# Patient Record
Sex: Female | Born: 1965 | Race: White | Hispanic: No | Smoking: Never smoker
Health system: Southern US, Community
[De-identification: ages and names within clinical notes are randomized; demographics above are authoritative.]

## PROBLEM LIST (undated history)

## (undated) DIAGNOSIS — R51 Headache: Secondary | ICD-10-CM

## (undated) DIAGNOSIS — G43909 Migraine, unspecified, not intractable, without status migrainosus: Secondary | ICD-10-CM

## (undated) DIAGNOSIS — M546 Pain in thoracic spine: Secondary | ICD-10-CM

## (undated) DIAGNOSIS — F329 Major depressive disorder, single episode, unspecified: Secondary | ICD-10-CM

## (undated) DIAGNOSIS — M545 Low back pain, unspecified: Secondary | ICD-10-CM

## (undated) DIAGNOSIS — M2550 Pain in unspecified joint: Secondary | ICD-10-CM

## (undated) DIAGNOSIS — H469 Unspecified optic neuritis: Secondary | ICD-10-CM

## (undated) DIAGNOSIS — R519 Headache, unspecified: Secondary | ICD-10-CM

## (undated) DIAGNOSIS — F32A Depression, unspecified: Secondary | ICD-10-CM

## (undated) DIAGNOSIS — E039 Hypothyroidism, unspecified: Secondary | ICD-10-CM

## (undated) DIAGNOSIS — R109 Unspecified abdominal pain: Secondary | ICD-10-CM

## (undated) DIAGNOSIS — M25511 Pain in right shoulder: Secondary | ICD-10-CM

## (undated) DIAGNOSIS — F4310 Post-traumatic stress disorder, unspecified: Secondary | ICD-10-CM

## (undated) DIAGNOSIS — E079 Disorder of thyroid, unspecified: Secondary | ICD-10-CM

## (undated) DIAGNOSIS — G932 Benign intracranial hypertension: Secondary | ICD-10-CM

## (undated) DIAGNOSIS — M797 Fibromyalgia: Secondary | ICD-10-CM

## (undated) DIAGNOSIS — M160 Bilateral primary osteoarthritis of hip: Secondary | ICD-10-CM

## (undated) DIAGNOSIS — F411 Generalized anxiety disorder: Secondary | ICD-10-CM

## (undated) DIAGNOSIS — G8929 Other chronic pain: Secondary | ICD-10-CM

## (undated) DIAGNOSIS — E119 Type 2 diabetes mellitus without complications: Secondary | ICD-10-CM

## (undated) DIAGNOSIS — K219 Gastro-esophageal reflux disease without esophagitis: Secondary | ICD-10-CM

## (undated) DIAGNOSIS — G894 Chronic pain syndrome: Secondary | ICD-10-CM

## (undated) DIAGNOSIS — Z87442 Personal history of urinary calculi: Secondary | ICD-10-CM

## (undated) DIAGNOSIS — I10 Essential (primary) hypertension: Secondary | ICD-10-CM

## (undated) DIAGNOSIS — M47812 Spondylosis without myelopathy or radiculopathy, cervical region: Secondary | ICD-10-CM

## (undated) DIAGNOSIS — K625 Hemorrhage of anus and rectum: Secondary | ICD-10-CM

## (undated) DIAGNOSIS — K760 Fatty (change of) liver, not elsewhere classified: Secondary | ICD-10-CM

## (undated) DIAGNOSIS — R1310 Dysphagia, unspecified: Secondary | ICD-10-CM

## (undated) DIAGNOSIS — G629 Polyneuropathy, unspecified: Secondary | ICD-10-CM

## (undated) DIAGNOSIS — D6852 Prothrombin gene mutation: Secondary | ICD-10-CM

## (undated) DIAGNOSIS — M5481 Occipital neuralgia: Secondary | ICD-10-CM

## (undated) DIAGNOSIS — F419 Anxiety disorder, unspecified: Secondary | ICD-10-CM

## (undated) DIAGNOSIS — M75100 Unspecified rotator cuff tear or rupture of unspecified shoulder, not specified as traumatic: Secondary | ICD-10-CM

## (undated) DIAGNOSIS — K7400 Hepatic fibrosis, unspecified: Secondary | ICD-10-CM

## (undated) DIAGNOSIS — G4700 Insomnia, unspecified: Secondary | ICD-10-CM

## (undated) DIAGNOSIS — I639 Cerebral infarction, unspecified: Secondary | ICD-10-CM

## (undated) DIAGNOSIS — I499 Cardiac arrhythmia, unspecified: Secondary | ICD-10-CM

## (undated) DIAGNOSIS — G501 Atypical facial pain: Secondary | ICD-10-CM

## (undated) DIAGNOSIS — Z9889 Other specified postprocedural states: Secondary | ICD-10-CM

## (undated) DIAGNOSIS — E785 Hyperlipidemia, unspecified: Secondary | ICD-10-CM

## (undated) DIAGNOSIS — N411 Chronic prostatitis: Secondary | ICD-10-CM

## (undated) DIAGNOSIS — T8859XA Other complications of anesthesia, initial encounter: Secondary | ICD-10-CM

## (undated) DIAGNOSIS — G459 Transient cerebral ischemic attack, unspecified: Secondary | ICD-10-CM

## (undated) DIAGNOSIS — G4733 Obstructive sleep apnea (adult) (pediatric): Secondary | ICD-10-CM

## (undated) DIAGNOSIS — M479 Spondylosis, unspecified: Secondary | ICD-10-CM

## (undated) DIAGNOSIS — Z9689 Presence of other specified functional implants: Secondary | ICD-10-CM

## (undated) DIAGNOSIS — R0600 Dyspnea, unspecified: Secondary | ICD-10-CM

## (undated) DIAGNOSIS — I2510 Atherosclerotic heart disease of native coronary artery without angina pectoris: Secondary | ICD-10-CM

## (undated) DIAGNOSIS — M25552 Pain in left hip: Secondary | ICD-10-CM

## (undated) DIAGNOSIS — R200 Anesthesia of skin: Secondary | ICD-10-CM

## (undated) DIAGNOSIS — D509 Iron deficiency anemia, unspecified: Secondary | ICD-10-CM

## (undated) HISTORY — DX: Headache, unspecified: R51.9

## (undated) HISTORY — DX: Iron deficiency anemia, unspecified: D50.9

## (undated) HISTORY — DX: Hyperlipidemia, unspecified: E78.5

## (undated) HISTORY — DX: Anesthesia of skin: R20.0

## (undated) HISTORY — DX: Pain in thoracic spine: M54.6

## (undated) HISTORY — DX: Major depressive disorder, single episode, unspecified: F32.9

## (undated) HISTORY — DX: Occipital neuralgia: M54.81

## (undated) HISTORY — DX: Pain in right shoulder: M25.511

## (undated) HISTORY — PX: OTHER SURGICAL HISTORY: SHX169

## (undated) HISTORY — PX: ABDOMINAL HYSTERECTOMY: SHX81

## (undated) HISTORY — DX: Chronic prostatitis: N41.1

## (undated) HISTORY — DX: Type 2 diabetes mellitus without complications: E11.9

## (undated) HISTORY — DX: Fibromyalgia: M79.7

## (undated) HISTORY — PX: ROTATOR CUFF REPAIR: SHX139

## (undated) HISTORY — DX: Other chronic pain: G89.29

## (undated) HISTORY — DX: Hemorrhage of anus and rectum: K62.5

## (undated) HISTORY — DX: Depression, unspecified: F32.A

## (undated) HISTORY — DX: Dysphagia, unspecified: R13.10

## (undated) HISTORY — DX: Spondylosis without myelopathy or radiculopathy, cervical region: M47.812

## (undated) HISTORY — DX: Unspecified optic neuritis: H46.9

## (undated) HISTORY — DX: Low back pain: M54.5

## (undated) HISTORY — DX: Low back pain, unspecified: M54.50

## (undated) HISTORY — DX: Headache: R51

## (undated) HISTORY — DX: Unspecified abdominal pain: R10.9

## (undated) HISTORY — PX: KNEE SURGERY: SHX244

## (undated) HISTORY — DX: Atypical facial pain: G50.1

## (undated) HISTORY — DX: Bilateral primary osteoarthritis of hip: M16.0

## (undated) HISTORY — PX: JOINT REPLACEMENT: SHX530

## (undated) HISTORY — DX: Unspecified rotator cuff tear or rupture of unspecified shoulder, not specified as traumatic: M75.100

## (undated) HISTORY — PX: CHOLECYSTECTOMY: SHX55

## (undated) HISTORY — DX: Pain in left hip: M25.552

## (undated) HISTORY — DX: Pain in unspecified joint: M25.50

## (undated) HISTORY — DX: Benign intracranial hypertension: G93.2

## (undated) HISTORY — DX: Insomnia, unspecified: G47.00

## (undated) HISTORY — DX: Obstructive sleep apnea (adult) (pediatric): G47.33

---

## 2004-03-16 ENCOUNTER — Emergency Department: Payer: Self-pay | Admitting: Emergency Medicine

## 2004-04-13 ENCOUNTER — Ambulatory Visit: Payer: Self-pay | Admitting: Pain Medicine

## 2004-04-22 ENCOUNTER — Ambulatory Visit: Payer: Self-pay | Admitting: Physician Assistant

## 2004-05-12 ENCOUNTER — Ambulatory Visit: Payer: Self-pay | Admitting: Pain Medicine

## 2004-06-02 ENCOUNTER — Ambulatory Visit: Payer: Self-pay | Admitting: Physician Assistant

## 2004-06-16 ENCOUNTER — Ambulatory Visit: Payer: Self-pay | Admitting: Physician Assistant

## 2004-07-16 ENCOUNTER — Ambulatory Visit: Payer: Self-pay | Admitting: Pain Medicine

## 2004-08-21 ENCOUNTER — Ambulatory Visit: Payer: Self-pay | Admitting: Physician Assistant

## 2004-09-15 ENCOUNTER — Ambulatory Visit: Payer: Self-pay | Admitting: Physician Assistant

## 2004-10-12 ENCOUNTER — Ambulatory Visit: Payer: Self-pay | Admitting: Physician Assistant

## 2004-11-04 ENCOUNTER — Other Ambulatory Visit: Payer: Self-pay

## 2004-11-11 ENCOUNTER — Ambulatory Visit: Payer: Self-pay | Admitting: Pain Medicine

## 2004-11-16 ENCOUNTER — Inpatient Hospital Stay: Payer: Self-pay | Admitting: Pain Medicine

## 2004-11-16 ENCOUNTER — Ambulatory Visit: Payer: Self-pay | Admitting: Pain Medicine

## 2004-12-02 ENCOUNTER — Ambulatory Visit: Payer: Self-pay | Admitting: Physician Assistant

## 2004-12-18 ENCOUNTER — Emergency Department: Payer: Self-pay | Admitting: Emergency Medicine

## 2004-12-19 ENCOUNTER — Other Ambulatory Visit: Payer: Self-pay

## 2005-01-05 ENCOUNTER — Ambulatory Visit: Payer: Self-pay | Admitting: Physician Assistant

## 2005-01-26 ENCOUNTER — Ambulatory Visit: Payer: Self-pay | Admitting: Physician Assistant

## 2005-03-04 ENCOUNTER — Ambulatory Visit: Payer: Self-pay | Admitting: Physician Assistant

## 2005-03-17 ENCOUNTER — Ambulatory Visit: Payer: Self-pay | Admitting: Physician Assistant

## 2005-03-24 ENCOUNTER — Ambulatory Visit: Payer: Self-pay | Admitting: Pain Medicine

## 2005-03-24 ENCOUNTER — Ambulatory Visit: Payer: Self-pay | Admitting: Physician Assistant

## 2005-03-29 ENCOUNTER — Ambulatory Visit: Payer: Self-pay | Admitting: Physician Assistant

## 2005-04-12 ENCOUNTER — Ambulatory Visit: Payer: Self-pay | Admitting: Pain Medicine

## 2005-04-13 ENCOUNTER — Ambulatory Visit: Payer: Self-pay | Admitting: Pain Medicine

## 2005-04-20 ENCOUNTER — Ambulatory Visit: Payer: Self-pay | Admitting: Pain Medicine

## 2005-04-22 ENCOUNTER — Ambulatory Visit: Payer: Self-pay | Admitting: Physician Assistant

## 2005-04-27 ENCOUNTER — Ambulatory Visit: Payer: Self-pay | Admitting: Physician Assistant

## 2005-04-30 ENCOUNTER — Ambulatory Visit: Payer: Self-pay | Admitting: Physician Assistant

## 2005-05-27 ENCOUNTER — Ambulatory Visit: Payer: Self-pay | Admitting: Physician Assistant

## 2005-05-29 ENCOUNTER — Ambulatory Visit: Payer: Self-pay | Admitting: Pain Medicine

## 2005-05-31 ENCOUNTER — Other Ambulatory Visit: Payer: Self-pay

## 2005-05-31 ENCOUNTER — Emergency Department: Payer: Self-pay | Admitting: Emergency Medicine

## 2005-06-10 ENCOUNTER — Ambulatory Visit: Payer: Self-pay | Admitting: Physician Assistant

## 2005-06-16 ENCOUNTER — Encounter: Payer: Self-pay | Admitting: Pain Medicine

## 2005-06-24 ENCOUNTER — Encounter: Payer: Self-pay | Admitting: Pain Medicine

## 2005-09-02 ENCOUNTER — Emergency Department: Payer: Self-pay | Admitting: Emergency Medicine

## 2005-09-24 ENCOUNTER — Ambulatory Visit: Payer: Self-pay | Admitting: Physician Assistant

## 2005-10-07 ENCOUNTER — Ambulatory Visit: Payer: Self-pay | Admitting: Pediatrics

## 2005-11-09 ENCOUNTER — Ambulatory Visit: Payer: Self-pay | Admitting: Physician Assistant

## 2006-12-23 ENCOUNTER — Ambulatory Visit: Payer: Self-pay | Admitting: Family Medicine

## 2006-12-26 ENCOUNTER — Other Ambulatory Visit: Payer: Self-pay

## 2006-12-26 ENCOUNTER — Emergency Department: Payer: Self-pay | Admitting: Emergency Medicine

## 2007-01-04 ENCOUNTER — Ambulatory Visit: Payer: Self-pay | Admitting: Family Medicine

## 2007-06-18 ENCOUNTER — Emergency Department: Payer: Self-pay | Admitting: Emergency Medicine

## 2007-08-31 ENCOUNTER — Emergency Department: Payer: Self-pay | Admitting: Emergency Medicine

## 2008-12-16 ENCOUNTER — Emergency Department: Payer: Self-pay | Admitting: Emergency Medicine

## 2009-07-02 ENCOUNTER — Emergency Department: Payer: Self-pay | Admitting: Emergency Medicine

## 2009-08-17 ENCOUNTER — Ambulatory Visit: Payer: Self-pay | Admitting: Internal Medicine

## 2010-01-17 ENCOUNTER — Emergency Department: Payer: Self-pay | Admitting: Emergency Medicine

## 2010-01-26 ENCOUNTER — Emergency Department: Payer: Self-pay | Admitting: Emergency Medicine

## 2010-01-27 ENCOUNTER — Emergency Department: Payer: Self-pay | Admitting: Emergency Medicine

## 2010-07-03 ENCOUNTER — Observation Stay: Payer: Self-pay | Admitting: Internal Medicine

## 2010-08-08 ENCOUNTER — Emergency Department: Payer: Self-pay | Admitting: Emergency Medicine

## 2011-03-10 ENCOUNTER — Ambulatory Visit: Payer: Self-pay

## 2011-10-03 DIAGNOSIS — G501 Atypical facial pain: Secondary | ICD-10-CM | POA: Insufficient documentation

## 2011-10-03 DIAGNOSIS — R519 Headache, unspecified: Secondary | ICD-10-CM | POA: Insufficient documentation

## 2011-10-03 DIAGNOSIS — M797 Fibromyalgia: Secondary | ICD-10-CM | POA: Insufficient documentation

## 2011-10-03 DIAGNOSIS — E785 Hyperlipidemia, unspecified: Secondary | ICD-10-CM | POA: Insufficient documentation

## 2011-10-04 DIAGNOSIS — M25511 Pain in right shoulder: Secondary | ICD-10-CM | POA: Insufficient documentation

## 2011-10-04 DIAGNOSIS — G4701 Insomnia due to medical condition: Secondary | ICD-10-CM | POA: Insufficient documentation

## 2011-11-17 ENCOUNTER — Emergency Department: Payer: Self-pay | Admitting: Internal Medicine

## 2011-11-17 LAB — COMPREHENSIVE METABOLIC PANEL
Albumin: 3.8 g/dL (ref 3.4–5.0)
Alkaline Phosphatase: 139 U/L — ABNORMAL HIGH (ref 50–136)
BUN: 9 mg/dL (ref 7–18)
Bilirubin,Total: 0.4 mg/dL (ref 0.2–1.0)
Calcium, Total: 8.7 mg/dL (ref 8.5–10.1)
Co2: 26 mmol/L (ref 21–32)
Creatinine: 1.03 mg/dL (ref 0.60–1.30)
EGFR (African American): >60
Glucose: 115 mg/dL — ABNORMAL HIGH (ref 65–99)
SGPT (ALT): 150 U/L — ABNORMAL HIGH
Sodium: 137 mmol/L (ref 136–145)
Total Protein: 8 g/dL (ref 6.4–8.2)

## 2011-11-17 LAB — URINALYSIS, COMPLETE
Bacteria: NONE SEEN
Bilirubin,UR: NEGATIVE
Blood: NEGATIVE
Ketone: NEGATIVE
Leukocyte Esterase: NEGATIVE
Nitrite: NEGATIVE
Protein: NEGATIVE
RBC,UR: NONE SEEN /HPF (ref 0–5)
Specific Gravity: 1.014 (ref 1.003–1.030)
Squamous Epithelial: NONE SEEN

## 2011-11-17 LAB — CK TOTAL AND CKMB (NOT AT ARMC)
CK, Total: 73 U/L (ref 21–215)
CK-MB: 0.8 ng/mL (ref 0.5–3.6)

## 2011-11-17 LAB — CBC
MCH: 23.3 pg — ABNORMAL LOW (ref 26.0–34.0)
MCV: 75 fL — ABNORMAL LOW (ref 80–100)
Platelet: 164 10*3/uL (ref 150–440)
RDW: 15.6 % — ABNORMAL HIGH (ref 11.5–14.5)
WBC: 10.8 10*3/uL (ref 3.6–11.0)

## 2011-11-17 LAB — PROTIME-INR: INR: 1

## 2011-11-22 LAB — CULTURE, BLOOD (SINGLE)

## 2012-01-17 DIAGNOSIS — M25559 Pain in unspecified hip: Secondary | ICD-10-CM | POA: Insufficient documentation

## 2012-02-22 DIAGNOSIS — G709 Myoneural disorder, unspecified: Secondary | ICD-10-CM | POA: Insufficient documentation

## 2012-02-22 DIAGNOSIS — F329 Major depressive disorder, single episode, unspecified: Secondary | ICD-10-CM | POA: Insufficient documentation

## 2012-02-22 DIAGNOSIS — F32A Depression, unspecified: Secondary | ICD-10-CM | POA: Insufficient documentation

## 2012-02-22 DIAGNOSIS — K219 Gastro-esophageal reflux disease without esophagitis: Secondary | ICD-10-CM | POA: Insufficient documentation

## 2012-02-22 DIAGNOSIS — E669 Obesity, unspecified: Secondary | ICD-10-CM | POA: Insufficient documentation

## 2012-02-22 DIAGNOSIS — I10 Essential (primary) hypertension: Secondary | ICD-10-CM | POA: Insufficient documentation

## 2012-08-20 ENCOUNTER — Emergency Department: Payer: Self-pay | Admitting: Emergency Medicine

## 2012-08-20 LAB — URINALYSIS, COMPLETE
Bacteria: NONE SEEN
Blood: NEGATIVE
Nitrite: NEGATIVE
Protein: NEGATIVE
Specific Gravity: 1.012 (ref 1.003–1.030)
Squamous Epithelial: 2

## 2012-08-20 LAB — CBC
HGB: 16 g/dL (ref 12.0–16.0)
MCH: 28.1 pg (ref 26.0–34.0)
Platelet: 256 10*3/uL (ref 150–440)
RBC: 5.69 10*6/uL — ABNORMAL HIGH (ref 3.80–5.20)

## 2012-08-20 LAB — COMPREHENSIVE METABOLIC PANEL
Alkaline Phosphatase: 271 U/L — ABNORMAL HIGH (ref 50–136)
Anion Gap: 9 (ref 7–16)
BUN: 12 mg/dL (ref 7–18)
Bilirubin,Total: 0.5 mg/dL (ref 0.2–1.0)
Calcium, Total: 9.2 mg/dL (ref 8.5–10.1)
Chloride: 101 mmol/L (ref 98–107)
Co2: 25 mmol/L (ref 21–32)
EGFR (African American): >60
EGFR (Non-African Amer.): >60
Glucose: 130 mg/dL — ABNORMAL HIGH (ref 65–99)
Osmolality: 272 (ref 275–301)
Potassium: 3.8 mmol/L (ref 3.5–5.1)
Sodium: 135 mmol/L — ABNORMAL LOW (ref 136–145)
Total Protein: 8.5 g/dL — ABNORMAL HIGH (ref 6.4–8.2)

## 2012-09-22 ENCOUNTER — Emergency Department: Payer: Self-pay | Admitting: Internal Medicine

## 2012-09-22 LAB — CBC
HCT: 44.9 % (ref 35.0–47.0)
HGB: 15.2 g/dL (ref 12.0–16.0)
MCH: 29 pg (ref 26.0–34.0)
MCHC: 34 g/dL (ref 32.0–36.0)
MCV: 85 fL (ref 80–100)
Platelet: 247 10*3/uL (ref 150–440)
RBC: 5.26 10*6/uL — ABNORMAL HIGH (ref 3.80–5.20)
RDW: 14.1 % (ref 11.5–14.5)

## 2012-09-22 LAB — COMPREHENSIVE METABOLIC PANEL
Bilirubin,Total: 0.4 mg/dL (ref 0.2–1.0)
Chloride: 106 mmol/L (ref 98–107)
Co2: 29 mmol/L (ref 21–32)
EGFR (Non-African Amer.): >60
Glucose: 124 mg/dL — ABNORMAL HIGH (ref 65–99)
Osmolality: 277 (ref 275–301)
Potassium: 3.7 mmol/L (ref 3.5–5.1)
SGPT (ALT): 57 U/L (ref 12–78)
Sodium: 139 mmol/L (ref 136–145)
Total Protein: 8.4 g/dL — ABNORMAL HIGH (ref 6.4–8.2)

## 2012-09-22 LAB — URINALYSIS, COMPLETE
Bacteria: NONE SEEN
Bilirubin,UR: NEGATIVE
Glucose,UR: NEGATIVE mg/dL (ref 0–75)
Leukocyte Esterase: NEGATIVE
Ph: 7 (ref 4.5–8.0)
Protein: NEGATIVE
RBC,UR: 5 /HPF (ref 0–5)
Specific Gravity: 1.014 (ref 1.003–1.030)
WBC UR: 1 /HPF (ref 0–5)

## 2012-09-22 LAB — TROPONIN I: Troponin-I: <0.02 ng/mL

## 2012-09-22 LAB — LIPASE, BLOOD: Lipase: 116 U/L (ref 73–393)

## 2012-09-28 DIAGNOSIS — K625 Hemorrhage of anus and rectum: Secondary | ICD-10-CM | POA: Insufficient documentation

## 2012-09-28 DIAGNOSIS — R109 Unspecified abdominal pain: Secondary | ICD-10-CM | POA: Insufficient documentation

## 2012-11-07 DIAGNOSIS — R0981 Nasal congestion: Secondary | ICD-10-CM | POA: Insufficient documentation

## 2012-11-13 DIAGNOSIS — M75100 Unspecified rotator cuff tear or rupture of unspecified shoulder, not specified as traumatic: Secondary | ICD-10-CM | POA: Insufficient documentation

## 2012-11-14 ENCOUNTER — Ambulatory Visit: Payer: Self-pay | Admitting: Otolaryngology

## 2012-11-16 DIAGNOSIS — M47812 Spondylosis without myelopathy or radiculopathy, cervical region: Secondary | ICD-10-CM | POA: Insufficient documentation

## 2013-03-12 ENCOUNTER — Ambulatory Visit: Payer: Self-pay | Admitting: Otolaryngology

## 2013-07-04 DIAGNOSIS — R51 Headache: Secondary | ICD-10-CM

## 2013-07-04 DIAGNOSIS — G4486 Cervicogenic headache: Secondary | ICD-10-CM | POA: Insufficient documentation

## 2013-09-15 ENCOUNTER — Emergency Department: Payer: Self-pay | Admitting: Emergency Medicine

## 2013-09-15 LAB — LIPASE, BLOOD: Lipase: 164 U/L (ref 73–393)

## 2013-09-15 LAB — ETHANOL
Ethanol %: <0.003 % (ref 0.000–0.080)
Ethanol: <3 mg/dL

## 2013-09-15 LAB — URINALYSIS, COMPLETE
BILIRUBIN, UR: NEGATIVE
Bacteria: NONE SEEN
Blood: NEGATIVE
Glucose,UR: NEGATIVE mg/dL (ref 0–75)
KETONE: NEGATIVE
Leukocyte Esterase: NEGATIVE
Nitrite: NEGATIVE
Ph: 7 (ref 4.5–8.0)
Protein: NEGATIVE
RBC, UR: NONE SEEN /HPF (ref 0–5)
Specific Gravity: 1.005 (ref 1.003–1.030)
WBC UR: <1 /HPF (ref 0–5)

## 2013-09-15 LAB — CBC WITH DIFFERENTIAL/PLATELET
BASOS ABS: 0.1 10*3/uL (ref 0.0–0.1)
Basophil %: 1 %
EOS ABS: 0.1 10*3/uL (ref 0.0–0.7)
EOS PCT: 1.5 %
HCT: 39.6 % (ref 35.0–47.0)
HGB: 12.7 g/dL (ref 12.0–16.0)
LYMPHS PCT: 38.6 %
Lymphocyte #: 3.6 10*3/uL (ref 1.0–3.6)
MCH: 25.2 pg — ABNORMAL LOW (ref 26.0–34.0)
MCHC: 32 g/dL (ref 32.0–36.0)
MCV: 79 fL — AB (ref 80–100)
MONO ABS: 0.7 x10 3/mm (ref 0.2–0.9)
Monocyte %: 7.8 %
NEUTROS ABS: 4.8 10*3/uL (ref 1.4–6.5)
Neutrophil %: 51.1 %
Platelet: 243 10*3/uL (ref 150–440)
RBC: 5.03 10*6/uL (ref 3.80–5.20)
RDW: 15.5 % — ABNORMAL HIGH (ref 11.5–14.5)
WBC: 9.3 10*3/uL (ref 3.6–11.0)

## 2013-09-15 LAB — COMPREHENSIVE METABOLIC PANEL
ALBUMIN: 4.2 g/dL (ref 3.4–5.0)
ALT: 81 U/L — AB (ref 12–78)
Alkaline Phosphatase: 153 U/L — ABNORMAL HIGH
Anion Gap: 8 (ref 7–16)
BUN: 9 mg/dL (ref 7–18)
Bilirubin,Total: 0.3 mg/dL (ref 0.2–1.0)
Calcium, Total: 9.2 mg/dL (ref 8.5–10.1)
Chloride: 104 mmol/L (ref 98–107)
Co2: 24 mmol/L (ref 21–32)
Creatinine: 0.89 mg/dL (ref 0.60–1.30)
EGFR (African American): >60
Glucose: 159 mg/dL — ABNORMAL HIGH (ref 65–99)
OSMOLALITY: 274 (ref 275–301)
Potassium: 3.9 mmol/L (ref 3.5–5.1)
SGOT(AST): 56 U/L — ABNORMAL HIGH (ref 15–37)
Sodium: 136 mmol/L (ref 136–145)
Total Protein: 8.8 g/dL — ABNORMAL HIGH (ref 6.4–8.2)

## 2013-09-15 LAB — TROPONIN I: Troponin-I: <0.02 ng/mL

## 2013-10-17 DIAGNOSIS — E039 Hypothyroidism, unspecified: Secondary | ICD-10-CM | POA: Insufficient documentation

## 2013-10-17 DIAGNOSIS — E119 Type 2 diabetes mellitus without complications: Secondary | ICD-10-CM | POA: Insufficient documentation

## 2014-01-19 ENCOUNTER — Emergency Department: Payer: Self-pay | Admitting: Emergency Medicine

## 2014-01-20 ENCOUNTER — Emergency Department: Payer: Self-pay | Admitting: Internal Medicine

## 2014-03-22 ENCOUNTER — Emergency Department: Payer: Self-pay | Admitting: Emergency Medicine

## 2014-03-22 LAB — COMPREHENSIVE METABOLIC PANEL
ALBUMIN: 4.5 g/dL (ref 3.4–5.0)
ALK PHOS: 147 U/L — AB
ALT: 37 U/L
Anion Gap: 11 (ref 7–16)
BUN: 7 mg/dL (ref 7–18)
Bilirubin,Total: 0.3 mg/dL (ref 0.2–1.0)
CALCIUM: 9 mg/dL (ref 8.5–10.1)
Chloride: 104 mmol/L (ref 98–107)
Co2: 23 mmol/L (ref 21–32)
Creatinine: 0.98 mg/dL (ref 0.60–1.30)
EGFR (Non-African Amer.): >60
GLUCOSE: 156 mg/dL — AB (ref 65–99)
OSMOLALITY: 277 (ref 275–301)
Potassium: 3.7 mmol/L (ref 3.5–5.1)
SGOT(AST): 34 U/L (ref 15–37)
Sodium: 138 mmol/L (ref 136–145)
Total Protein: 8.8 g/dL — ABNORMAL HIGH (ref 6.4–8.2)

## 2014-03-22 LAB — URINALYSIS, COMPLETE
BILIRUBIN, UR: NEGATIVE
Bacteria: NONE SEEN
Blood: NEGATIVE
Glucose,UR: NEGATIVE mg/dL (ref 0–75)
Hyaline Cast: 18
KETONE: NEGATIVE
Leukocyte Esterase: NEGATIVE
NITRITE: NEGATIVE
PH: 5 (ref 4.5–8.0)
RBC,UR: 7 /HPF (ref 0–5)
Specific Gravity: 1.027 (ref 1.003–1.030)
Squamous Epithelial: 1

## 2014-03-22 LAB — CBC
HCT: 35.5 % (ref 35.0–47.0)
HGB: 10.5 g/dL — ABNORMAL LOW (ref 12.0–16.0)
MCH: 21.9 pg — ABNORMAL LOW (ref 26.0–34.0)
MCHC: 29.7 g/dL — AB (ref 32.0–36.0)
MCV: 74 fL — AB (ref 80–100)
Platelet: 330 10*3/uL (ref 150–440)
RBC: 4.82 10*6/uL (ref 3.80–5.20)
RDW: 17.2 % — AB (ref 11.5–14.5)
WBC: 8.7 10*3/uL (ref 3.6–11.0)

## 2014-04-11 DIAGNOSIS — M5481 Occipital neuralgia: Secondary | ICD-10-CM | POA: Insufficient documentation

## 2014-04-22 ENCOUNTER — Emergency Department: Payer: Self-pay | Admitting: Emergency Medicine

## 2014-05-06 ENCOUNTER — Ambulatory Visit: Payer: Self-pay | Admitting: Family Medicine

## 2014-07-24 DIAGNOSIS — M546 Pain in thoracic spine: Secondary | ICD-10-CM

## 2014-07-24 DIAGNOSIS — G8929 Other chronic pain: Secondary | ICD-10-CM | POA: Insufficient documentation

## 2014-10-02 DIAGNOSIS — D509 Iron deficiency anemia, unspecified: Secondary | ICD-10-CM | POA: Insufficient documentation

## 2015-06-27 DIAGNOSIS — Z86718 Personal history of other venous thrombosis and embolism: Secondary | ICD-10-CM | POA: Insufficient documentation

## 2015-06-27 DIAGNOSIS — Z8673 Personal history of transient ischemic attack (TIA), and cerebral infarction without residual deficits: Secondary | ICD-10-CM | POA: Insufficient documentation

## 2015-09-01 DIAGNOSIS — R760 Raised antibody titer: Secondary | ICD-10-CM | POA: Insufficient documentation

## 2015-09-01 DIAGNOSIS — D6852 Prothrombin gene mutation: Secondary | ICD-10-CM | POA: Insufficient documentation

## 2015-09-16 ENCOUNTER — Encounter: Payer: Self-pay | Admitting: *Deleted

## 2015-09-16 ENCOUNTER — Ambulatory Visit
Admission: EM | Admit: 2015-09-16 | Discharge: 2015-09-16 | Disposition: A | Discharge status: Hospice | Payer: Medicare Other | Attending: Family Medicine | Admitting: Family Medicine

## 2015-09-16 DIAGNOSIS — R1319 Other dysphagia: Secondary | ICD-10-CM | POA: Insufficient documentation

## 2015-09-16 DIAGNOSIS — R109 Unspecified abdominal pain: Secondary | ICD-10-CM | POA: Insufficient documentation

## 2015-09-16 DIAGNOSIS — R200 Anesthesia of skin: Secondary | ICD-10-CM | POA: Insufficient documentation

## 2015-09-16 HISTORY — DX: Disorder of thyroid, unspecified: E07.9

## 2015-09-16 HISTORY — DX: Essential (primary) hypertension: I10

## 2015-09-16 HISTORY — DX: Type 2 diabetes mellitus without complications: E11.9

## 2015-09-16 LAB — URINALYSIS COMPLETE WITH MICROSCOPIC (ARMC ONLY)
BILIRUBIN URINE: NEGATIVE
Bacteria, UA: NONE SEEN
GLUCOSE, UA: NEGATIVE mg/dL
HGB URINE DIPSTICK: NEGATIVE
Ketones, ur: NEGATIVE mg/dL
Leukocytes, UA: NEGATIVE
Nitrite: NEGATIVE
Protein, ur: NEGATIVE mg/dL
RBC / HPF: NONE SEEN RBC/hpf (ref 0–5)
pH: 6 (ref 5.0–8.0)

## 2015-09-16 MED ORDER — ONDANSETRON 8 MG PO TBDP
8.0000 mg | ORAL_TABLET | Freq: Once | ORAL | Status: AC
  Administered 2015-09-16: 8 mg via ORAL

## 2015-09-16 MED ORDER — KETOROLAC TROMETHAMINE 60 MG/2ML IM SOLN
60.0000 mg | Freq: Once | INTRAMUSCULAR | Status: AC
  Administered 2015-09-16: 60 mg via INTRAMUSCULAR

## 2015-09-16 NOTE — ED Notes (Signed)
Suprapubic pain x1 week with nausea. Denies fever.

## 2015-09-16 NOTE — ED Provider Notes (Signed)
CSN: [id number]     Arrival date & time 09/16/15  1846 History   First MD Initiated Contact with Patient 09/16/15 2126     Chief Complaint  Patient presents with  . Abdominal Pain  . Nausea   (Consider location/radiation/quality/duration/timing/severity/associated sxs/prior Treatment) HPI   This a 50 year old female with a very complicated history presenting with  suprapubic pain for 1 week. She states that she came home from the [place] [facility] at 3:00 this afternoon after being discharged at one. She had been admitted to the hospital  With a stroke from a blood clot to her brain. Necessitating her being placed on Lovenox and Pradaxa. She states that while in the hospital she was having this pain but she states that because of the pain medicine that she was taking the pain was not as severe.After she arrived home this afternoon, the pain began to build more and more and she states that as she stands or sits it feels like it is suprapubic although when lying down it is more on her periumbilical region. She was given 60 mg of Toradol here while awaiting to be seen and when she was seen states that it had not begun to work at all. I asked her why she didn't go back to [facility] this afternoon and she stated that she lives near here and did not want to wait long. She was diagnosed with several clotting factor abnormalities which are probably caused the blood clot to form.  Past Medical History  Diagnosis Date  . Diabetes mellitus without complication ([place])   . Thyroid disease   . Hypertension    Past Surgical History  Procedure Laterality Date  . Cholecystectomy    . Abdominal hysterectomy     History reviewed. No pertinent family history. Social History  Substance Use Topics  . Smoking status: Never Smoker   . Smokeless tobacco: None  . Alcohol Use: No   OB History    No data available     Review of Systems  Constitutional: Positive for activity change. Negative for fever, chills and  fatigue.  Gastrointestinal: Positive for nausea and abdominal pain. Negative for vomiting, diarrhea and constipation.  Genitourinary: Positive for difficulty urinating.  All other systems reviewed and are negative.   Allergies  Amoxapine and related and Keflex  Home Medications   Prior to Admission medications   Not on File   Meds Ordered and Administered this Visit   Medications  ketorolac (TORADOL) injection 60 mg (60 mg Intramuscular Given 09/16/15 2053)  ondansetron (ZOFRAN-ODT) disintegrating tablet 8 mg (8 mg Oral Given 09/16/15 2053)    BP 140/97 mmHg  Pulse 107  Temp(Src) 97.6 F (36.4 C) (Oral)  Resp 20  Ht 5\' 6"  (1.676 m)  Wt 223 lb (101.152 kg)  BMI 36.01 kg/m2  SpO2 96% No data found.   Physical Exam  Constitutional: She is oriented to person, place, and time. She appears well-developed and well-nourished. No distress.  She has obvious pain.  HENT:  Head: Normocephalic and atraumatic.  Eyes: Conjunctivae are normal. Pupils are equal, round, and reactive to light.  Neck: Normal range of motion. Neck supple.  Pulmonary/Chest: Effort normal and breath sounds normal. No respiratory distress. She has no wheezes. She has no rales.  Abdominal: Soft. Bowel sounds are normal. She exhibits distension. She exhibits no mass. There is tenderness. There is no rebound and no guarding.  Exam of the abdomen shows multiple striae present. There is ecchymotic areas from Lovenox injections.  Bowel sounds are present and seem more active on the right side than the left. She has no epigastric tenderness. She is tender in the right Umbilical area; the right lower quadrant and left lower quadrant.No Rebound no guarding present. No masses are palpable.  Musculoskeletal: Normal range of motion. She exhibits no edema or tenderness.  Neurological: She is alert and oriented to person, place, and time.  Skin: Skin is warm. She is diaphoretic.  Psychiatric: She has a normal mood and affect.  Her behavior is normal. Judgment and thought content normal.  Nursing note and vitals reviewed.   ED Course  Procedures (including critical care time)  Labs Review Labs Reviewed  URINALYSIS COMPLETEWITH MICROSCOPIC ([facility] ONLY) - Abnormal; Notable for the following:    Specific Gravity, Urine <1.005 (*)    Squamous Epithelial / LPF 0-5 (*)    All other components within normal limits  URINE CULTURE    Imaging Review No results found.   Visual Acuity Review  Right Eye Distance:   Left Eye Distance:   Bilateral Distance:    Right Eye Near:   Left Eye Near:    Bilateral Near:         MDM   1. Abdominal pain in female patient    After examining the patient I talked with her and her boyfriend explained that she really needs to return to [facility] for further evaluation and workup. We do not have the availability of a CT scan and [facility] would be the best place for her to seek reevaluation since she was released from there today.  She was stable at the time of her discharge. Her husband Will transport her to [facility] from here directly.    Lorin Picket, PA-C 09/16/15 2256

## 2015-09-18 LAB — URINE CULTURE: SPECIAL REQUESTS: NORMAL

## 2015-10-14 ENCOUNTER — Other Ambulatory Visit: Payer: Self-pay | Admitting: Family Medicine

## 2015-10-14 DIAGNOSIS — R1031 Right lower quadrant pain: Secondary | ICD-10-CM

## 2015-10-14 DIAGNOSIS — N3090 Cystitis, unspecified without hematuria: Secondary | ICD-10-CM

## 2015-10-17 ENCOUNTER — Ambulatory Visit
Admission: RE | Admit: 2015-10-17 | Discharge: 2015-10-17 | Disposition: A | Discharge status: Home / Self Care | Payer: Medicare Other | Source: Ambulatory Visit | Attending: Family Medicine | Admitting: Family Medicine

## 2015-10-17 DIAGNOSIS — N3090 Cystitis, unspecified without hematuria: Secondary | ICD-10-CM | POA: Insufficient documentation

## 2015-10-17 DIAGNOSIS — R1031 Right lower quadrant pain: Secondary | ICD-10-CM | POA: Insufficient documentation

## 2015-10-27 ENCOUNTER — Ambulatory Visit: Payer: Medicare Other

## 2015-11-04 DIAGNOSIS — Z79891 Long term (current) use of opiate analgesic: Secondary | ICD-10-CM

## 2015-11-04 DIAGNOSIS — Z5181 Encounter for therapeutic drug level monitoring: Secondary | ICD-10-CM | POA: Insufficient documentation

## 2016-03-29 DIAGNOSIS — Z7901 Long term (current) use of anticoagulants: Secondary | ICD-10-CM | POA: Insufficient documentation

## 2016-05-11 DIAGNOSIS — G43009 Migraine without aura, not intractable, without status migrainosus: Secondary | ICD-10-CM | POA: Insufficient documentation

## 2016-05-28 DIAGNOSIS — G8929 Other chronic pain: Secondary | ICD-10-CM | POA: Insufficient documentation

## 2016-05-28 DIAGNOSIS — G894 Chronic pain syndrome: Secondary | ICD-10-CM | POA: Insufficient documentation

## 2016-05-28 DIAGNOSIS — N411 Chronic prostatitis: Secondary | ICD-10-CM

## 2016-07-01 DIAGNOSIS — G4733 Obstructive sleep apnea (adult) (pediatric): Secondary | ICD-10-CM | POA: Insufficient documentation

## 2016-10-12 DIAGNOSIS — G08 Intracranial and intraspinal phlebitis and thrombophlebitis: Secondary | ICD-10-CM | POA: Insufficient documentation

## 2016-11-22 ENCOUNTER — Ambulatory Visit (INDEPENDENT_AMBULATORY_CARE_PROVIDER_SITE_OTHER): Payer: Medicare Other

## 2016-11-22 ENCOUNTER — Ambulatory Visit
Admission: EM | Admit: 2016-11-22 | Discharge: 2016-11-22 | Disposition: A | Discharge status: Home / Self Care | Payer: Medicare Other | Attending: Family Medicine | Admitting: Family Medicine

## 2016-11-22 DIAGNOSIS — R0602 Shortness of breath: Secondary | ICD-10-CM

## 2016-11-22 DIAGNOSIS — R079 Chest pain, unspecified: Secondary | ICD-10-CM | POA: Insufficient documentation

## 2016-11-22 DIAGNOSIS — R739 Hyperglycemia, unspecified: Secondary | ICD-10-CM

## 2016-11-22 DIAGNOSIS — I8290 Acute embolism and thrombosis of unspecified vein: Secondary | ICD-10-CM | POA: Diagnosis not present

## 2016-11-22 DIAGNOSIS — F419 Anxiety disorder, unspecified: Secondary | ICD-10-CM | POA: Diagnosis not present

## 2016-11-22 DIAGNOSIS — E079 Disorder of thyroid, unspecified: Secondary | ICD-10-CM | POA: Insufficient documentation

## 2016-11-22 DIAGNOSIS — G4730 Sleep apnea, unspecified: Secondary | ICD-10-CM | POA: Diagnosis not present

## 2016-11-22 DIAGNOSIS — E1165 Type 2 diabetes mellitus with hyperglycemia: Secondary | ICD-10-CM | POA: Diagnosis not present

## 2016-11-22 DIAGNOSIS — I10 Essential (primary) hypertension: Secondary | ICD-10-CM | POA: Insufficient documentation

## 2016-11-22 LAB — CBC WITH DIFFERENTIAL/PLATELET
BASOS ABS: 0.1 10*3/uL (ref 0–0.1)
BASOS PCT: 1 %
EOS ABS: 0 10*3/uL (ref 0–0.7)
EOS PCT: 0 %
HCT: 44.3 % (ref 35.0–47.0)
Hemoglobin: 14.6 g/dL (ref 12.0–16.0)
Lymphocytes Relative: 21 %
Lymphs Abs: 2.7 10*3/uL (ref 1.0–3.6)
MCH: 27.4 pg (ref 26.0–34.0)
MCHC: 33 g/dL (ref 32.0–36.0)
MCV: 83 fL (ref 80.0–100.0)
MONO ABS: 1 10*3/uL — AB (ref 0.2–0.9)
MONOS PCT: 8 %
Neutro Abs: 9.4 10*3/uL — ABNORMAL HIGH (ref 1.4–6.5)
Neutrophils Relative %: 70 %
PLATELETS: 320 10*3/uL (ref 150–440)
RBC: 5.34 MIL/uL — ABNORMAL HIGH (ref 3.80–5.20)
RDW: 13.2 % (ref 11.5–14.5)
WBC: 13.3 10*3/uL — ABNORMAL HIGH (ref 3.6–11.0)

## 2016-11-22 LAB — BASIC METABOLIC PANEL
ANION GAP: 11 (ref 5–15)
BUN: 17 mg/dL (ref 6–20)
CALCIUM: 9.5 mg/dL (ref 8.9–10.3)
CO2: 23 mmol/L (ref 22–32)
CREATININE: 1 mg/dL (ref 0.44–1.00)
Chloride: 98 mmol/L — ABNORMAL LOW (ref 101–111)
Glucose, Bld: 333 mg/dL — ABNORMAL HIGH (ref 65–99)
Potassium: 3.9 mmol/L (ref 3.5–5.1)
Sodium: 132 mmol/L — ABNORMAL LOW (ref 135–145)

## 2016-11-22 LAB — FIBRIN DERIVATIVES D-DIMER (ARMC ONLY): FIBRIN DERIVATIVES D-DIMER (ARMC): 174.98 (ref 0.00–499.00)

## 2016-11-22 NOTE — Discharge Instructions (Signed)
° °  Follow up with your primary care physician tomorrow as scheduled. Return to Urgent care or Emergency room for chest pain, shortness of breath, new or worsening concerns.
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
## 2016-11-22 NOTE — ED Triage Notes (Addendum)
51 year old Caucasian woman is here today with shortness of breath that started around 10:00 am this morning. She states she does have a history of anxiety and panic attacks. She states she does not feel like this is one of those. She states she did take 1pill of her Klonopin around 12 noon with no relief. She also states she did receive two steroid injections last week from her orthopedic and feels this has caused her glucose to be elevated and is very worried over it.

## 2016-12-08 ENCOUNTER — Emergency Department
Admission: EM | Admit: 2016-12-08 | Discharge: 2016-12-08 | Disposition: A | Discharge status: Home / Self Care | Payer: Medicare Other | Attending: Emergency Medicine | Admitting: Emergency Medicine

## 2016-12-08 ENCOUNTER — Emergency Department: Payer: Medicare Other

## 2016-12-08 ENCOUNTER — Encounter: Payer: Self-pay | Admitting: Emergency Medicine

## 2016-12-08 DIAGNOSIS — R0981 Nasal congestion: Secondary | ICD-10-CM | POA: Insufficient documentation

## 2016-12-08 DIAGNOSIS — E119 Type 2 diabetes mellitus without complications: Secondary | ICD-10-CM | POA: Diagnosis not present

## 2016-12-08 DIAGNOSIS — I10 Essential (primary) hypertension: Secondary | ICD-10-CM | POA: Diagnosis not present

## 2016-12-08 DIAGNOSIS — E079 Disorder of thyroid, unspecified: Secondary | ICD-10-CM | POA: Diagnosis not present

## 2016-12-08 DIAGNOSIS — J069 Acute upper respiratory infection, unspecified: Secondary | ICD-10-CM | POA: Insufficient documentation

## 2016-12-08 DIAGNOSIS — R05 Cough: Secondary | ICD-10-CM | POA: Diagnosis present

## 2016-12-08 MED ORDER — PREDNISONE 10 MG (21) PO TBPK: ORAL_TABLET | ORAL | 0 refills | Status: DC

## 2016-12-08 MED ORDER — IPRATROPIUM-ALBUTEROL 0.5-2.5 (3) MG/3ML IN SOLN
3.0000 mL | Freq: Once | RESPIRATORY_TRACT | Status: AC
  Administered 2016-12-08: 3 mL via RESPIRATORY_TRACT
  Filled 2016-12-08: qty 3

## 2016-12-08 MED ORDER — ALBUTEROL SULFATE HFA 108 (90 BASE) MCG/ACT IN AERS: 2.0000 | INHALATION_SPRAY | Freq: Four times a day (QID) | RESPIRATORY_TRACT | 0 refills | Status: DC | PRN

## 2016-12-08 MED ORDER — AZITHROMYCIN 250 MG PO TABS: ORAL_TABLET | ORAL | 0 refills | Status: DC

## 2016-12-08 NOTE — ED Notes (Signed)
Pt presents with cough and congestion x 1 week. Pt states she has been taking mucinex and that she has been getting worse. Called pcp, who could not see her. Pt also c/o body aches and sore throat; body aches have resolved, but throat is still sore. Pt's lungs are clear; coughing during assessment.

## 2016-12-08 NOTE — ED Notes (Signed)
Pt discharged home after verbalizing understanding of discharge instructions; nad noted. 

## 2016-12-08 NOTE — ED Triage Notes (Signed)
Presents with cold sx's for about a weeks  Cough and chest congestion

## 2016-12-08 NOTE — ED Provider Notes (Signed)
[facility] Emergency Department Provider Note  ____________________________________________  Time seen: Approximately 9:43 AM  I have reviewed the triage vital signs and the nursing notes.   HISTORY  Chief Complaint Cough; Nasal Congestion; and URI    HPI Christine Mcneil is a 51 y.o. female that presents to the emergency department with 1 week of nasal congestion, sore throat, cough. Patient states that she feels like she has to cough something up but is unable to do this so. She had a low-grade fever a couple of days ago. She has been taking Mucinex and cough drops for symptoms. She does not smoke. No history of allergies or asthma. She denies shortness breath, chest pain, nausea, vomiting, abdominal pain.   Past Medical History:  Diagnosis Date  . Diabetes mellitus without complication ([place])   . Hypertension   . Thyroid disease     There are no active problems to display for this patient.   Past Surgical History:  Procedure Laterality Date  . ABDOMINAL HYSTERECTOMY    . CHOLECYSTECTOMY      Prior to Admission medications   Medication Sig Start Date End Date Taking? Authorizing Provider  albuterol (PROVENTIL HFA;VENTOLIN HFA) 108 (90 Base) MCG/ACT inhaler Inhale 2 puffs into the lungs every 6 (six) hours as needed for wheezing or shortness of breath. 12/08/16   Laban Emperor, PA-C  azithromycin (ZITHROMAX Z-PAK) 250 MG tablet Take 2 tablets (500 mg) on  Day 1,  followed by 1 tablet (250 mg) once daily on Days 2 through 5. 12/08/16   Laban Emperor, PA-C  predniSONE (STERAPRED UNI-PAK 21 TAB) 10 MG (21) TBPK tablet Take 6 tablets on day 1, take 5 tablets on day 2, take 4 tablets on day 3, take 3 tablets on day 4, take 2 tablets on day 5, take 1 tablet on day 6 12/08/16   Laban Emperor, PA-C    Allergies Amoxapine and related and Keflex [cephalexin]  Family History  Problem Relation Age of Onset  . Diabetes Mother   . Hyperlipidemia Mother   .  Hypertension Mother   . COPD Mother   . Diabetes Father   . Hyperlipidemia Father   . Hypertension Father   . Thyroid disease Father     Social History Social History  Substance Use Topics  . Smoking status: Never Smoker  . Smokeless tobacco: Never Used  . Alcohol use No     Review of Systems  Constitutional: No fever/chills Eyes: No visual changes. No discharge. ENT: Positive for congestion and rhinorrhea. Cardiovascular: No chest pain. Respiratory: Positive for cough. No SOB. Gastrointestinal: No abdominal pain.  No nausea, no vomiting.  No diarrhea.  No constipation. Musculoskeletal: Negative for musculoskeletal pain. Skin: Negative for rash, abrasions, lacerations, ecchymosis. Neurological: Negative for headaches.   ____________________________________________   PHYSICAL EXAM:  VITAL SIGNS: ED Triage Vitals  Enc Vitals Group     BP 12/08/16 0850 128/73     Pulse Rate 12/08/16 0850 (!) 111     Resp 12/08/16 0850 20     Temp 12/08/16 0850 97.6 F (36.4 C)     Temp Source 12/08/16 0850 Oral     SpO2 12/08/16 0850 95 %     Weight --      Height --      Head Circumference --      Peak Flow --      Pain Score 12/08/16 0855 5     Pain Loc --  Pain Edu? --      Excl. in [place]? --      Constitutional: Alert and oriented. Well appearing and in no acute distress. Eyes: Conjunctivae are normal. PERRL. EOMI. No discharge. Head: Atraumatic. ENT: No frontal and maxillary sinus tenderness.      Ears: Tympanic membranes pearly gray with good landmarks. No discharge.      Nose: Mild congestion/rhinnorhea.      Mouth/Throat: Mucous membranes are moist. Oropharynx non-erythematous. Tonsils not enlarged. No exudates. Uvula midline. Neck: No stridor.   Hematological/Lymphatic/Immunilogical: No cervical lymphadenopathy. Cardiovascular: Normal rate, regular rhythm.  Good peripheral circulation. Respiratory: Normal respiratory effort without tachypnea or retractions.  Lungs CTAB. Good air entry to the bases with no decreased or absent breath sounds. Gastrointestinal: Bowel sounds 4 quadrants. Soft and nontender to palpation. No guarding or rigidity. No palpable masses. No distention. Musculoskeletal: Full range of motion to all extremities. No gross deformities appreciated. Neurologic:  Normal speech and language. No gross focal neurologic deficits are appreciated.  Skin:  Skin is warm, dry and intact. No rash noted.   ____________________________________________   LABS (all labs ordered are listed, but only abnormal results are displayed)  Labs Reviewed - No data to display ____________________________________________  EKG   ____________________________________________  RADIOLOGY Robinette Haines, personally viewed and evaluated these images (plain radiographs) as part of my medical decision making, as well as reviewing the written report by the radiologist.  Dg Chest 2 View  Result Date: 12/08/2016 CLINICAL DATA:  Cough for 1 week EXAM: CHEST  2 VIEW COMPARISON:  11/22/2016 FINDINGS: Normal heart size and mediastinal contours. No acute infiltrate or edema. No effusion or pneumothorax. Thoracic spondylosis. No acute osseous findings. . Stable positioning of 2 wires in the left back. IMPRESSION: No evidence of active disease. Electronically Signed   By: Monte Fantasia M.D.   On: 12/08/2016 09:21    ____________________________________________    PROCEDURES  Procedure(s) performed:    Procedures    Medications  ipratropium-albuterol (DUONEB) 0.5-2.5 (3) MG/3ML nebulizer solution 3 mL (3 mLs Nebulization Given 12/08/16 1001)     ____________________________________________   INITIAL IMPRESSION / ASSESSMENT AND PLAN / ED COURSE  Pertinent labs & imaging results that were available during my care of the patient were reviewed by me and considered in my medical decision making (see chart for details).  Review of the [place] CSRS was  performed in accordance of the [place] prior to dispensing any controlled drugs.     Patient's diagnosis is consistent with upper respiratory infection. Vital signs and exam are reassuring. We discussed doing a chest x-ray and I discussed with patient that I did not think it was necessary because it would not change my management and her lungs are clear to auscultation but patient wished to proceed with the x-ray. Chest x-ray shows no acute cardiopulmonary processes per radiology. She felt better after DuoNeb. Patient appears well and is staying well hydrated. Patient feels comfortable going home. Patient will be discharged home with prescriptions for azithromycin, prednisone, albuterol. Patient is to follow up with PCP as needed or otherwise directed. Patient is given ED precautions to return to the ED for any worsening or new symptoms.     ____________________________________________  FINAL CLINICAL IMPRESSION(S) / ED DIAGNOSES  Final diagnoses:  Upper respiratory tract infection, unspecified type      NEW MEDICATIONS STARTED DURING THIS VISIT:  Discharge Medication List as of 12/08/2016 10:10 AM    START taking these medications   Details  albuterol (PROVENTIL HFA;VENTOLIN HFA) 108 (90 Base) MCG/ACT inhaler Inhale 2 puffs into the lungs every 6 (six) hours as needed for wheezing or shortness of breath., Starting Wed 12/08/2016, Print    azithromycin (ZITHROMAX Z-PAK) 250 MG tablet Take 2 tablets (500 mg) on  Day 1,  followed by 1 tablet (250 mg) once daily on Days 2 through 5., Print    predniSONE (STERAPRED UNI-PAK 21 TAB) 10 MG (21) TBPK tablet Take 6 tablets on day 1, take 5 tablets on day 2, take 4 tablets on day 3, take 3 tablets on day 4, take 2 tablets on day 5, take 1 tablet on day 6, Print            This chart was dictated using voice recognition software/Dragon. Despite best efforts to proofread, errors can occur which can change the meaning. Any change was purely  unintentional.    Laban Emperor, PA-C 12/08/16 1040    Lisa Roca, MD 12/08/16 [phone]

## 2016-12-12 ENCOUNTER — Encounter: Payer: Self-pay | Admitting: Emergency Medicine

## 2016-12-12 ENCOUNTER — Emergency Department: Payer: Medicare Other

## 2016-12-12 ENCOUNTER — Emergency Department
Admission: EM | Admit: 2016-12-12 | Discharge: 2016-12-12 | Disposition: A | Discharge status: Home / Self Care | Payer: Medicare Other | Attending: Emergency Medicine | Admitting: Emergency Medicine

## 2016-12-12 DIAGNOSIS — Z79899 Other long term (current) drug therapy: Secondary | ICD-10-CM | POA: Diagnosis not present

## 2016-12-12 DIAGNOSIS — Z7984 Long term (current) use of oral hypoglycemic drugs: Secondary | ICD-10-CM | POA: Diagnosis not present

## 2016-12-12 DIAGNOSIS — R079 Chest pain, unspecified: Secondary | ICD-10-CM | POA: Diagnosis present

## 2016-12-12 DIAGNOSIS — J209 Acute bronchitis, unspecified: Secondary | ICD-10-CM

## 2016-12-12 DIAGNOSIS — Z7901 Long term (current) use of anticoagulants: Secondary | ICD-10-CM | POA: Diagnosis not present

## 2016-12-12 DIAGNOSIS — I10 Essential (primary) hypertension: Secondary | ICD-10-CM | POA: Insufficient documentation

## 2016-12-12 DIAGNOSIS — E039 Hypothyroidism, unspecified: Secondary | ICD-10-CM | POA: Diagnosis not present

## 2016-12-12 DIAGNOSIS — E119 Type 2 diabetes mellitus without complications: Secondary | ICD-10-CM | POA: Diagnosis not present

## 2016-12-12 LAB — BASIC METABOLIC PANEL
ANION GAP: 11 (ref 5–15)
BUN: 14 mg/dL (ref 6–20)
CHLORIDE: 105 mmol/L (ref 101–111)
CO2: 22 mmol/L (ref 22–32)
Calcium: 9.3 mg/dL (ref 8.9–10.3)
Creatinine, Ser: 1.13 mg/dL — ABNORMAL HIGH (ref 0.44–1.00)
GFR calc Af Amer: >60 mL/min (ref >60)
GFR, EST NON AFRICAN AMERICAN: 55 mL/min — AB (ref >60)
Glucose, Bld: 268 mg/dL — ABNORMAL HIGH (ref 65–99)
POTASSIUM: 3.7 mmol/L (ref 3.5–5.1)
Sodium: 138 mmol/L (ref 135–145)

## 2016-12-12 LAB — CBC
HEMATOCRIT: 42 % (ref 35.0–47.0)
HEMOGLOBIN: 13.8 g/dL (ref 12.0–16.0)
MCH: 27.5 pg (ref 26.0–34.0)
MCHC: 32.8 g/dL (ref 32.0–36.0)
MCV: 83.8 fL (ref 80.0–100.0)
Platelets: 229 10*3/uL (ref 150–440)
RBC: 5.01 MIL/uL (ref 3.80–5.20)
RDW: 15.3 % — AB (ref 11.5–14.5)
WBC: 8.2 10*3/uL (ref 3.6–11.0)

## 2016-12-12 LAB — TROPONIN I: Troponin I: <0.03 ng/mL (ref <0.03)

## 2016-12-12 MED ORDER — PREDNISONE 20 MG PO TABS
60.0000 mg | ORAL_TABLET | Freq: Once | ORAL | Status: AC
  Administered 2016-12-12: 60 mg via ORAL
  Filled 2016-12-12: qty 3

## 2016-12-12 MED ORDER — AEROCHAMBER PLUS W/MASK MISC: 2 refills | Status: DC

## 2016-12-12 MED ORDER — ALBUTEROL SULFATE (2.5 MG/3ML) 0.083% IN NEBU
5.0000 mg | INHALATION_SOLUTION | Freq: Once | RESPIRATORY_TRACT | Status: AC
  Administered 2016-12-12: 5 mg via RESPIRATORY_TRACT
  Filled 2016-12-12: qty 6

## 2016-12-12 MED ORDER — ALBUTEROL SULFATE (2.5 MG/3ML) 0.083% IN NEBU
5.0000 mg | INHALATION_SOLUTION | Freq: Once | RESPIRATORY_TRACT | Status: DC
  Filled 2016-12-12: qty 6

## 2016-12-12 MED ORDER — ALBUTEROL SULFATE (2.5 MG/3ML) 0.083% IN NEBU
2.5000 mg | INHALATION_SOLUTION | Freq: Once | RESPIRATORY_TRACT | Status: AC
  Administered 2016-12-12: 2.5 mg via RESPIRATORY_TRACT

## 2016-12-12 MED ORDER — PREDNISONE 20 MG PO TABS: 40.0000 mg | ORAL_TABLET | Freq: Every day | ORAL | 0 refills | Status: DC

## 2016-12-12 MED ORDER — GUAIFENESIN 100 MG/5ML PO SOLN: 5.0000 mL | ORAL | 0 refills | Status: DC | PRN

## 2016-12-12 MED ORDER — ALBUTEROL SULFATE HFA 108 (90 BASE) MCG/ACT IN AERS: 2.0000 | INHALATION_SPRAY | RESPIRATORY_TRACT | 0 refills | Status: DC | PRN

## 2016-12-12 MED ORDER — IPRATROPIUM-ALBUTEROL 0.5-2.5 (3) MG/3ML IN SOLN
3.0000 mL | Freq: Once | RESPIRATORY_TRACT | Status: AC
  Administered 2016-12-12: 3 mL via RESPIRATORY_TRACT
  Filled 2016-12-12: qty 6

## 2016-12-12 NOTE — ED Provider Notes (Signed)
[facility] Emergency Department Provider Note  ____________________________________________  Time seen: Approximately 7:22 PM  I have reviewed the triage vital signs and the nursing notes.   HISTORY  Chief Complaint Chest Pain and Cough    HPI Christine Mcneil is a 51 y.o. female who complains of productive cough and anterior chest pain described as bubbles popping for the past 2 weeks, but improved after she was given antibiotics and steroids recently for URI, but since the steroids ran out, worse again. No aggravating factors. Nonradiating. Not pleuritic, sharp chest pain. Mildly short of breath. Not exertional. No recent travel, hospitalization or surgery. No history of DVT or PE.     Past Medical History:  Diagnosis Date  . Diabetes mellitus without complication ([place])   . Hypertension   . Thyroid disease      There are no active problems to display for this patient.    Past Surgical History:  Procedure Laterality Date  . ABDOMINAL HYSTERECTOMY    . CHOLECYSTECTOMY    . ROTATOR CUFF REPAIR Right      Prior to Admission medications   Medication Sig Start Date End Date Taking? Authorizing Provider  acetaZOLAMIDE (DIAMOX) 500 MG capsule Take 500 mg by mouth daily. 11/18/16  Yes [provider]  atorvastatin (LIPITOR) 40 MG tablet Take 40 mg by mouth daily. 07/11/15  Yes [provider]  buPROPion (WELLBUTRIN SR) 150 MG 12 hr tablet Take 300 mg by mouth daily. 10/30/15  Yes [provider]  clonazePAM (KLONOPIN) 1 MG tablet Take 1 mg by mouth daily as needed. 12/07/16  Yes [provider]  dabigatran (PRADAXA) 150 MG CAPS capsule Take 150 mg by mouth 2 (two) times daily. 10/12/16 10/12/17 Yes [provider]  DULoxetine (CYMBALTA) 60 MG capsule Take 60 mg by mouth 2 (two) times daily. 03/15/11  Yes [provider]  esomeprazole (NEXIUM) 40 MG capsule Take 40 mg by mouth daily.   Yes [provider]  hydrOXYzine (ATARAX/VISTARIL) 50 MG tablet Take 50 mg by mouth 2 (two) times daily. 11/06/15  Yes [provider]  levothyroxine (SYNTHROID, LEVOTHROID) 100 MCG tablet Take 100 mcg by mouth daily. 08/18/15  Yes [provider]  MAGNESIUM PO Take 500 mg by mouth 2 (two) times daily.   Yes [provider]  metFORMIN (GLUCOPHAGE) 500 MG tablet Take 500 mg by mouth 2 (two) times daily.   Yes [provider]  quinapril (ACCUPRIL) 10 MG tablet Take 10 mg by mouth daily. 05/01/13  Yes [provider]  ranitidine (ZANTAC) 150 MG tablet Take 150 mg by mouth 2 (two) times daily. 01/20/15  Yes [provider]  tapentadol (NUCYNTA) 100 MG 12 hr tablet Take 100 mg by mouth 2 (two) times daily. 12/28/16 01/27/17 Yes [provider]  verapamil (CALAN-SR) 180 MG CR tablet Take 180 mg by mouth daily. 01/16/07  Yes [provider]  zolpidem (AMBIEN) 10 MG tablet Take 10 mg by mouth daily. 12/16/16 01/15/17 Yes [provider]  albuterol (PROVENTIL HFA) 108 (90 Base) MCG/ACT inhaler Inhale 2 puffs into the lungs every 4 (four) hours as needed for wheezing or shortness of breath. 12/12/16   Carrie Mew, MD  guaiFENesin (ROBITUSSIN) 100 MG/5ML SOLN Take 5 mLs (100 mg total) by mouth every 4 (four) hours as needed for cough or to loosen phlegm. 12/12/16   Carrie Mew, MD  predniSONE (DELTASONE) 20 MG tablet Take 2 tablets (40 mg total) by mouth daily.  12/12/16   Carrie Mew, MD  Spacer/Aero-Holding Chambers (AEROCHAMBER PLUS WITH MASK) inhaler Use as instructed 12/12/16   Carrie Mew, MD     Allergies Amoxapine and related and Keflex [cephalexin]   Family History  Problem Relation Age of Onset  . Diabetes Mother   . Hyperlipidemia Mother   . Hypertension Mother   . COPD Mother   . Diabetes Father   . Hyperlipidemia Father   . Hypertension Father   . Thyroid disease Father     Social History Social  History  Substance Use Topics  . Smoking status: Never Smoker  . Smokeless tobacco: Never Used  . Alcohol use No    Review of Systems  Constitutional:   No fever or chills.  ENT:   No sore throat. No rhinorrhea. Cardiovascular:   No chest pain or syncope. Respiratory:   No dyspnea or cough. Gastrointestinal:   Negative for abdominal pain, vomiting and diarrhea.  Musculoskeletal:   Negative for focal pain or swelling All other systems reviewed and are negative except as documented above in ROS and HPI.  ____________________________________________   PHYSICAL EXAM:  VITAL SIGNS: ED Triage Vitals  Enc Vitals Group     BP 12/12/16 1240 (!) 141/92     Pulse Rate 12/12/16 1240 (!) 124     Resp 12/12/16 1240 (!) 24     Temp 12/12/16 1240 97.6 F (36.4 C)     Temp Source 12/12/16 1240 Oral     SpO2 12/12/16 1240 98 %     Weight 12/12/16 1241 235 lb (106.6 kg)     Height 12/12/16 1241 5\' 6"  (1.676 m)     Head Circumference --      Peak Flow --      Pain Score 12/12/16 1250 6     Pain Loc --      Pain Edu? --      Excl. in [place]? --     Vital signs reviewed, nursing assessments reviewed.   Constitutional:   Alert and oriented. Well appearing and in no distress. Eyes:   No scleral icterus.  EOMI. No nystagmus. No conjunctival pallor. PERRL. ENT   Head:   Normocephalic and atraumatic.   Nose:   No congestion/rhinnorhea.    Mouth/Throat:   MMM, no pharyngeal erythema. No peritonsillar mass.    Neck:   No meningismus. Full ROM Hematological/Lymphatic/Immunilogical:   No cervical lymphadenopathy. Cardiovascular:   RRR, Heart rate 90. Symmetric bilateral radial and DP pulses.  No murmurs.  Respiratory:   Normal respiratory effort without tachypnea/retractions. Coarse breath sounds diffusely with expiratory wheezing, accentuated by FEV1 maneuver. Gastrointestinal:   Soft and nontender. Non distended. There is no CVA tenderness.  No rebound, rigidity, or  guarding. Genitourinary:   deferred Musculoskeletal:   Normal range of motion in all extremities. No joint effusions.  No lower extremity tenderness.  No edema. Neurologic:   Normal speech and language.  Motor grossly intact. No gross focal neurologic deficits are appreciated.  Skin:    Skin is warm, dry and intact. No rash noted.  No petechiae, purpura, or bullae.  ____________________________________________    LABS (pertinent positives/negatives) (all labs ordered are listed, but only abnormal results are displayed) Labs Reviewed  BASIC METABOLIC PANEL - Abnormal; Notable for the following:       Result Value   Glucose, Bld 268 (*)    Creatinine, Ser 1.13 (*)    GFR calc non Af Amer 55 (*)    All other  components within normal limits  CBC - Abnormal; Notable for the following:    RDW 15.3 (*)    All other components within normal limits  TROPONIN I   ____________________________________________   EKG  Interpreted by me Sinus tachycardia rate 113. Normal axis intervals QRS ST segments and T waves. No evidence of right heart strain.  ____________________________________________    RADIOLOGY  Dg Chest 2 View  Result Date: 12/12/2016 CLINICAL DATA:  Cough 2 weeks. EXAM: CHEST  2 VIEW COMPARISON:  12/08/2016 FINDINGS: Lungs are adequately inflated without consolidation or effusion. Cardiomediastinal silhouette is within normal. Mild degenerate change of the spine. Two adjacent wires over the medial left posterior thorax unchanged. IMPRESSION: No active cardiopulmonary disease. Electronically Signed   By: Marin Olp M.D.   On: 12/12/2016 13:25   Ct Chest Wo Contrast  Result Date: 12/12/2016 CLINICAL DATA:  Congestion with productive cough.  Chest discomfort. EXAM: CT CHEST WITHOUT CONTRAST TECHNIQUE: Multidetector CT imaging of the chest was performed following the standard protocol without IV contrast. COMPARISON:  Chest x-ray 12/12/2016. FINDINGS: Cardiovascular: The  heart size is normal. No pericardial effusion. Coronary artery calcification is noted. No thoracic aortic aneurysm. Mediastinum/Nodes: No mediastinal lymphadenopathy. No evidence for gross hilar lymphadenopathy although assessment is limited by the lack of intravenous contrast on today's study. There is no axillary lymphadenopathy. The esophagus has normal imaging features. Lungs/Pleura: Major airways are patent. 2 mm right upper lobe nodule seen on image 34 of series 3. No focal airspace consolidation. No pulmonary edema or pleural effusion. Upper Abdomen: Unremarkable. Musculoskeletal: A device wire is identified in the subcutaneous fat of the left posterior paramidline back. Bone windows reveal no worrisome lytic or sclerotic osseous lesions. IMPRESSION: 1. No acute findings in the chest. Specifically, no CT evidence of pneumonia. 2. 2 mm right upper lobe pulmonary nodule. No follow-up needed if patient is low-risk. Non-contrast chest CT can be considered in 12 months if patient is high-risk. This recommendation follows the consensus statement: Guidelines for Management of Incidental Pulmonary Nodules Detected on CT Images: From the Fleischner Society 2017; Radiology 2017; 284:228-243. Electronically Signed   By: Misty Stanley M.D.   On: 12/12/2016 17:19    ____________________________________________   PROCEDURES Procedures  ____________________________________________   INITIAL IMPRESSION / ASSESSMENT AND PLAN / ED COURSE  Pertinent labs & imaging results that were available during my care of the patient were reviewed by me and considered in my medical decision making (see chart for details).  Patient presents with chest discomfort shortness of breath, clinically consistent with acute bronchitis. Labs unremarkable, CT chest unremarkable. Tachycardia observed in triage resolved bedtime patient was evaluated in treatment room without any interventions. Wells criteria are low risk . Considering  the patient's symptoms, medical history, and physical examination today, I have low suspicion for ACS, PE, TAD, pneumothorax, carditis, mediastinitis, CHF, or sepsis.        ____________________________________________   FINAL CLINICAL IMPRESSION(S) / ED DIAGNOSES  Final diagnoses:  Acute bronchitis, unspecified organism      New Prescriptions   ALBUTEROL (PROVENTIL HFA) 108 (90 BASE) MCG/ACT INHALER    Inhale 2 puffs into the lungs every 4 (four) hours as needed for wheezing or shortness of breath.   GUAIFENESIN (ROBITUSSIN) 100 MG/5ML SOLN    Take 5 mLs (100 mg total) by mouth every 4 (four) hours as needed for cough or to loosen phlegm.   PREDNISONE (DELTASONE) 20 MG TABLET    Take 2 tablets (40 mg total) by  mouth daily.   SPACER/AERO-HOLDING CHAMBERS (AEROCHAMBER PLUS WITH MASK) INHALER    Use as instructed     Portions of this note were generated with dragon dictation software. Dictation errors may occur despite best attempts at proofreading.    Carrie Mew, MD 12/12/16 Kathyrn Drown

## 2016-12-12 NOTE — ED Triage Notes (Signed)
Pt reports congested and productive cough for a 2 weeks ago reports came to ER diagnosed with URI pt reports finished course of antibiotics reports, about 2 days ago chest discomfort "feekls like bubbles are popping" unable to expectorate

## 2017-01-03 ENCOUNTER — Inpatient Hospital Stay
Admission: EM | Admit: 2017-01-03 | Discharge: 2017-01-06 | DRG: 308 | Disposition: A | Discharge status: Home / Self Care | Payer: Medicare Other | Attending: Internal Medicine | Admitting: Internal Medicine

## 2017-01-03 ENCOUNTER — Emergency Department: Payer: Medicare Other

## 2017-01-03 ENCOUNTER — Encounter: Payer: Self-pay | Admitting: Emergency Medicine

## 2017-01-03 DIAGNOSIS — Z79899 Other long term (current) drug therapy: Secondary | ICD-10-CM | POA: Diagnosis not present

## 2017-01-03 DIAGNOSIS — T461X1A Poisoning by calcium-channel blockers, accidental (unintentional), initial encounter: Secondary | ICD-10-CM | POA: Diagnosis present

## 2017-01-03 DIAGNOSIS — D6852 Prothrombin gene mutation: Secondary | ICD-10-CM | POA: Diagnosis present

## 2017-01-03 DIAGNOSIS — I119 Hypertensive heart disease without heart failure: Secondary | ICD-10-CM | POA: Diagnosis present

## 2017-01-03 DIAGNOSIS — Z6838 Body mass index (BMI) 38.0-38.9, adult: Secondary | ICD-10-CM

## 2017-01-03 DIAGNOSIS — G894 Chronic pain syndrome: Secondary | ICD-10-CM | POA: Diagnosis present

## 2017-01-03 DIAGNOSIS — Z7984 Long term (current) use of oral hypoglycemic drugs: Secondary | ICD-10-CM

## 2017-01-03 DIAGNOSIS — R0602 Shortness of breath: Secondary | ICD-10-CM | POA: Diagnosis present

## 2017-01-03 DIAGNOSIS — G43909 Migraine, unspecified, not intractable, without status migrainosus: Secondary | ICD-10-CM | POA: Diagnosis present

## 2017-01-03 DIAGNOSIS — F329 Major depressive disorder, single episode, unspecified: Secondary | ICD-10-CM | POA: Diagnosis present

## 2017-01-03 DIAGNOSIS — E669 Obesity, unspecified: Secondary | ICD-10-CM | POA: Diagnosis present

## 2017-01-03 DIAGNOSIS — E785 Hyperlipidemia, unspecified: Secondary | ICD-10-CM | POA: Diagnosis present

## 2017-01-03 DIAGNOSIS — E039 Hypothyroidism, unspecified: Secondary | ICD-10-CM | POA: Diagnosis present

## 2017-01-03 DIAGNOSIS — R001 Bradycardia, unspecified: Secondary | ICD-10-CM | POA: Diagnosis present

## 2017-01-03 DIAGNOSIS — F419 Anxiety disorder, unspecified: Secondary | ICD-10-CM | POA: Diagnosis present

## 2017-01-03 DIAGNOSIS — Z7902 Long term (current) use of antithrombotics/antiplatelets: Secondary | ICD-10-CM | POA: Diagnosis not present

## 2017-01-03 DIAGNOSIS — Z90710 Acquired absence of both cervix and uterus: Secondary | ICD-10-CM | POA: Diagnosis not present

## 2017-01-03 DIAGNOSIS — E119 Type 2 diabetes mellitus without complications: Secondary | ICD-10-CM | POA: Diagnosis present

## 2017-01-03 DIAGNOSIS — J189 Pneumonia, unspecified organism: Secondary | ICD-10-CM | POA: Diagnosis present

## 2017-01-03 DIAGNOSIS — I442 Atrioventricular block, complete: Secondary | ICD-10-CM | POA: Diagnosis present

## 2017-01-03 DIAGNOSIS — Z881 Allergy status to other antibiotic agents status: Secondary | ICD-10-CM | POA: Diagnosis not present

## 2017-01-03 DIAGNOSIS — Z7952 Long term (current) use of systemic steroids: Secondary | ICD-10-CM | POA: Diagnosis not present

## 2017-01-03 DIAGNOSIS — Z880 Allergy status to penicillin: Secondary | ICD-10-CM

## 2017-01-03 DIAGNOSIS — K219 Gastro-esophageal reflux disease without esophagitis: Secondary | ICD-10-CM | POA: Diagnosis present

## 2017-01-03 HISTORY — DX: Major depressive disorder, single episode, unspecified: F32.9

## 2017-01-03 HISTORY — DX: Anxiety disorder, unspecified: F41.9

## 2017-01-03 HISTORY — DX: Other chronic pain: G89.29

## 2017-01-03 HISTORY — DX: Migraine, unspecified, not intractable, without status migrainosus: G43.909

## 2017-01-03 HISTORY — DX: Prothrombin gene mutation: D68.52

## 2017-01-03 HISTORY — DX: Depression, unspecified: F32.A

## 2017-01-03 LAB — BASIC METABOLIC PANEL
ANION GAP: 14 (ref 5–15)
BUN: 12 mg/dL (ref 6–20)
CHLORIDE: 103 mmol/L (ref 101–111)
CO2: 20 mmol/L — ABNORMAL LOW (ref 22–32)
Calcium: 9.4 mg/dL (ref 8.9–10.3)
Creatinine, Ser: 1.26 mg/dL — ABNORMAL HIGH (ref 0.44–1.00)
GFR calc Af Amer: 56 mL/min — ABNORMAL LOW (ref >60)
GFR, EST NON AFRICAN AMERICAN: 48 mL/min — AB (ref >60)
GLUCOSE: 196 mg/dL — AB (ref 65–99)
POTASSIUM: 4.4 mmol/L (ref 3.5–5.1)
Sodium: 137 mmol/L (ref 135–145)

## 2017-01-03 LAB — URINALYSIS, ROUTINE W REFLEX MICROSCOPIC
Bilirubin Urine: NEGATIVE
GLUCOSE, UA: NEGATIVE mg/dL
Hgb urine dipstick: NEGATIVE
KETONES UR: NEGATIVE mg/dL
LEUKOCYTES UA: NEGATIVE
NITRITE: NEGATIVE
PROTEIN: NEGATIVE mg/dL
Specific Gravity, Urine: 1.016 (ref 1.005–1.030)
pH: 7 (ref 5.0–8.0)

## 2017-01-03 LAB — CBC
HEMATOCRIT: 44.3 % (ref 35.0–47.0)
HEMOGLOBIN: 14.5 g/dL (ref 12.0–16.0)
MCH: 27.2 pg (ref 26.0–34.0)
MCHC: 32.7 g/dL (ref 32.0–36.0)
MCV: 83.3 fL (ref 80.0–100.0)
Platelets: 340 10*3/uL (ref 150–440)
RBC: 5.32 MIL/uL — ABNORMAL HIGH (ref 3.80–5.20)
RDW: 16 % — AB (ref 11.5–14.5)
WBC: 15.3 10*3/uL — AB (ref 3.6–11.0)

## 2017-01-03 LAB — TROPONIN I

## 2017-01-03 LAB — GLUCOSE, CAPILLARY: Glucose-Capillary: 138 mg/dL — ABNORMAL HIGH (ref 65–99)

## 2017-01-03 LAB — BRAIN NATRIURETIC PEPTIDE: B NATRIURETIC PEPTIDE 5: 40 pg/mL (ref 0.0–100.0)

## 2017-01-03 MED ORDER — ACETAZOLAMIDE ER 500 MG PO CP12
500.0000 mg | ORAL_CAPSULE | Freq: Every day | ORAL | Status: DC
  Administered 2017-01-04 – 2017-01-06 (×3): 500 mg via ORAL
  Filled 2017-01-03 (×3): qty 1

## 2017-01-03 MED ORDER — PROMETHAZINE HCL 25 MG PO TABS
25.0000 mg | ORAL_TABLET | Freq: Three times a day (TID) | ORAL | Status: DC | PRN
  Filled 2017-01-03: qty 1

## 2017-01-03 MED ORDER — ONDANSETRON HCL 4 MG PO TABS
4.0000 mg | ORAL_TABLET | Freq: Four times a day (QID) | ORAL | Status: DC | PRN
  Administered 2017-01-05: 4 mg via ORAL
  Filled 2017-01-03: qty 1

## 2017-01-03 MED ORDER — BUPROPION HCL ER (SR) 150 MG PO TB12
300.0000 mg | ORAL_TABLET | Freq: Every day | ORAL | Status: DC
  Administered 2017-01-04 – 2017-01-06 (×3): 300 mg via ORAL
  Filled 2017-01-03 (×3): qty 2

## 2017-01-03 MED ORDER — ACETAMINOPHEN 650 MG RE SUPP: 650.0000 mg | Freq: Four times a day (QID) | RECTAL | Status: DC | PRN

## 2017-01-03 MED ORDER — FAMOTIDINE 20 MG PO TABS
20.0000 mg | ORAL_TABLET | Freq: Every day | ORAL | Status: DC
  Administered 2017-01-04 – 2017-01-05 (×3): 20 mg via ORAL
  Filled 2017-01-03 (×3): qty 1

## 2017-01-03 MED ORDER — MILNACIPRAN HCL 100 MG PO TABS
50.0000 mg | ORAL_TABLET | Freq: Two times a day (BID) | ORAL | Status: DC
  Administered 2017-01-03 – 2017-01-06 (×6): 50 mg via ORAL
  Filled 2017-01-03 (×7): qty 0.5

## 2017-01-03 MED ORDER — LINAGLIPTIN 5 MG PO TABS
5.0000 mg | ORAL_TABLET | Freq: Every day | ORAL | Status: DC
  Administered 2017-01-04 – 2017-01-06 (×3): 5 mg via ORAL
  Filled 2017-01-03 (×3): qty 1

## 2017-01-03 MED ORDER — ACETAMINOPHEN 325 MG PO TABS
650.0000 mg | ORAL_TABLET | Freq: Four times a day (QID) | ORAL | Status: DC | PRN
  Administered 2017-01-03 – 2017-01-05 (×3): 650 mg via ORAL
  Filled 2017-01-03 (×3): qty 2

## 2017-01-03 MED ORDER — DULOXETINE HCL 30 MG PO CPEP
60.0000 mg | ORAL_CAPSULE | Freq: Two times a day (BID) | ORAL | Status: DC
  Administered 2017-01-03 – 2017-01-06 (×6): 60 mg via ORAL
  Filled 2017-01-03 (×6): qty 2

## 2017-01-03 MED ORDER — HYDROXYZINE HCL 50 MG PO TABS
50.0000 mg | ORAL_TABLET | Freq: Two times a day (BID) | ORAL | Status: DC | PRN
  Filled 2017-01-03: qty 1

## 2017-01-03 MED ORDER — LEVOFLOXACIN IN D5W 750 MG/150ML IV SOLN
750.0000 mg | INTRAVENOUS | Status: DC
  Filled 2017-01-03: qty 150

## 2017-01-03 MED ORDER — BISACODYL 5 MG PO TBEC: 5.0000 mg | DELAYED_RELEASE_TABLET | Freq: Every day | ORAL | Status: DC | PRN

## 2017-01-03 MED ORDER — VITAMIN D 1000 UNITS PO TABS
1000.0000 [IU] | ORAL_TABLET | Freq: Every day | ORAL | Status: DC
  Administered 2017-01-04 – 2017-01-06 (×3): 1000 [IU] via ORAL
  Filled 2017-01-03 (×3): qty 1

## 2017-01-03 MED ORDER — ZOLPIDEM TARTRATE 5 MG PO TABS
5.0000 mg | ORAL_TABLET | Freq: Every evening | ORAL | Status: DC | PRN
Start: 2017-01-03 — End: 2017-01-03

## 2017-01-03 MED ORDER — LEVOFLOXACIN IN D5W 750 MG/150ML IV SOLN
750.0000 mg | Freq: Once | INTRAVENOUS | Status: AC
  Administered 2017-01-03: 750 mg via INTRAVENOUS
  Filled 2017-01-03: qty 150

## 2017-01-03 MED ORDER — TAPENTADOL HCL ER 50 MG PO TB12
100.0000 mg | ORAL_TABLET | Freq: Two times a day (BID) | ORAL | Status: DC
  Administered 2017-01-03 – 2017-01-06 (×6): 100 mg via ORAL
  Filled 2017-01-03 (×6): qty 2

## 2017-01-03 MED ORDER — LISINOPRIL 10 MG PO TABS
10.0000 mg | ORAL_TABLET | Freq: Every day | ORAL | Status: DC
  Administered 2017-01-04 – 2017-01-06 (×3): 10 mg via ORAL
  Filled 2017-01-03 (×3): qty 1

## 2017-01-03 MED ORDER — ATORVASTATIN CALCIUM 20 MG PO TABS
40.0000 mg | ORAL_TABLET | Freq: Every day | ORAL | Status: DC
  Administered 2017-01-03 – 2017-01-06 (×4): 40 mg via ORAL
  Filled 2017-01-03 (×4): qty 2

## 2017-01-03 MED ORDER — CLONAZEPAM 1 MG PO TABS
1.0000 mg | ORAL_TABLET | Freq: Every day | ORAL | Status: DC | PRN
  Administered 2017-01-05 – 2017-01-06 (×2): 1 mg via ORAL
  Filled 2017-01-03 (×2): qty 1

## 2017-01-03 MED ORDER — ZOLPIDEM TARTRATE 5 MG PO TABS
5.0000 mg | ORAL_TABLET | Freq: Every day | ORAL | Status: DC
  Administered 2017-01-03 – 2017-01-05 (×3): 5 mg via ORAL
  Filled 2017-01-03 (×3): qty 1

## 2017-01-03 MED ORDER — SODIUM CHLORIDE 0.9% FLUSH
3.0000 mL | Freq: Two times a day (BID) | INTRAVENOUS | Status: DC
  Administered 2017-01-03 – 2017-01-06 (×6): 3 mL via INTRAVENOUS

## 2017-01-03 MED ORDER — LORATADINE 10 MG PO TABS: 10.0000 mg | ORAL_TABLET | Freq: Every day | ORAL | Status: DC | PRN

## 2017-01-03 MED ORDER — ONDANSETRON HCL 4 MG/2ML IJ SOLN: 4.0000 mg | Freq: Four times a day (QID) | INTRAMUSCULAR | Status: DC | PRN

## 2017-01-03 MED ORDER — ALBUTEROL SULFATE (2.5 MG/3ML) 0.083% IN NEBU: 2.5000 mg | INHALATION_SOLUTION | RESPIRATORY_TRACT | Status: DC | PRN

## 2017-01-03 MED ORDER — LEVOTHYROXINE SODIUM 100 MCG PO TABS
100.0000 ug | ORAL_TABLET | Freq: Every day | ORAL | Status: DC
  Administered 2017-01-04 – 2017-01-06 (×3): 100 ug via ORAL
  Filled 2017-01-03 (×3): qty 1

## 2017-01-03 MED ORDER — DABIGATRAN ETEXILATE MESYLATE 150 MG PO CAPS
150.0000 mg | ORAL_CAPSULE | Freq: Two times a day (BID) | ORAL | Status: DC
  Administered 2017-01-03 – 2017-01-06 (×6): 150 mg via ORAL
  Filled 2017-01-03 (×7): qty 1

## 2017-01-03 MED ORDER — METFORMIN HCL 500 MG PO TABS
500.0000 mg | ORAL_TABLET | Freq: Two times a day (BID) | ORAL | Status: DC
  Administered 2017-01-03 – 2017-01-06 (×6): 500 mg via ORAL
  Filled 2017-01-03 (×6): qty 1

## 2017-01-03 MED ORDER — PANTOPRAZOLE SODIUM 40 MG PO TBEC
40.0000 mg | DELAYED_RELEASE_TABLET | Freq: Every day | ORAL | Status: DC
  Administered 2017-01-04 – 2017-01-06 (×3): 40 mg via ORAL
  Filled 2017-01-03 (×3): qty 1

## 2017-01-03 NOTE — ED Notes (Signed)
Pt taken off of NRB mask per ED MD; pt placed on 4L nasal cannula.  Oxygen saturation 98% at this time, will continue to monitor.

## 2017-01-03 NOTE — ED Notes (Signed)
MD to bedside, explained slow HR to patient may be caused by her Verapamil, explained pt will be admitted to hospital. Pt's HR 56 at this time. Pt states understanding at this time. Pt remains on 10L via non-rebreather, O2 100%, pt remains on Zoll at this time.

## 2017-01-03 NOTE — Progress Notes (Signed)
Patient has prescription of verapamil for Dr. Verdell Carmine to review. Discussed the instructions on th bottle with Dr. Verdell Carmine. Will place prescription at pharmacy.

## 2017-01-03 NOTE — H&P (Signed)
[facility] [place] NAME: Christine Mcneil    MR#:  [id number]  DATE OF BIRTH:  [birth year]  DATE OF ADMISSION:  01/03/2017  PRIMARY CARE PHYSICIAN: Zhou-Talbert, Elwyn Lade, MD   REQUESTING/REFERRING PHYSICIAN: Dr. Harvest Dark  CHIEF COMPLAINT:   Chief Complaint  Patient presents with  . Shortness of Breath    HISTORY OF PRESENT ILLNESS:  Christine Mcneil  is a 51 y.o. 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 Patient says that she became significantly short of breath earlier this morning and therefore was brought to the ER for further evaluation. She presented to the emergency room she was noted to be significantly bradycardic and in complete heart block. She subsequently converted back to normal sinus rhythm by herself without any intervention. She is presently hemodynamically stable. Patient does take verapamil at home and it's unclear whether she takes it once a day or she takes 3 pills once a day. Patient's chest x-ray was also consistent with suspected pneumonia and therefore hospice services were contacted further treatment and evaluation. Patient presently denies any chest pains, nausea, vomiting, abdominal pain or any other associated symptoms presently.  PAST MEDICAL HISTORY:   Past Medical History:  Diagnosis Date  . Anxiety and depression   . Chronic pain   . Diabetes mellitus without complication ([place])   . Hypertension   . Migraines   . Prothrombin gene mutation ([place])   . Thyroid disease     PAST SURGICAL HISTORY:   Past Surgical History:  Procedure Laterality Date  . ABDOMINAL HYSTERECTOMY    . CHOLECYSTECTOMY    . ROTATOR CUFF REPAIR Right     SOCIAL HISTORY:   Social History  Substance Use Topics  .  Smoking status: Never Smoker  . Smokeless tobacco: Never Used  . Alcohol use No    FAMILY HISTORY:   Family History  Problem Relation Age of Onset  . Diabetes Mother   . Hyperlipidemia Mother   . Hypertension Mother   . COPD Mother   . Diabetes Father   . Hyperlipidemia Father   . Hypertension Father   . Thyroid disease Father     DRUG ALLERGIES:   Allergies  Allergen Reactions  . Amoxicillin Itching  . Keflex [Cephalexin] Itching    REVIEW OF SYSTEMS:   Review of Systems  Constitutional: Negative for fever and weight loss.  HENT: Negative for congestion, nosebleeds and tinnitus.   Eyes: Negative for blurred vision, double vision and redness.  Respiratory: Positive for shortness of breath. Negative for cough and hemoptysis.   Cardiovascular: Negative for chest pain, orthopnea, leg swelling and PND.  Gastrointestinal: Negative for abdominal pain, diarrhea, melena, nausea and vomiting.  Genitourinary: Negative for dysuria, hematuria and urgency.  Musculoskeletal: Negative for falls and joint pain.  Neurological: Positive for weakness. Negative for dizziness, tingling, sensory change, focal weakness, seizures and headaches.  Endo/Heme/Allergies: Negative for polydipsia. Does not bruise/bleed easily.  Psychiatric/Behavioral: Negative for depression and memory loss. The patient is not nervous/anxious.     MEDICATIONS AT HOME:   Prior to Admission medications   Medication Sig Start Date End Date Taking? Authorizing Provider  acetaZOLAMIDE (DIAMOX) 500 MG capsule Take 500 mg by mouth daily. 11/18/16  Yes [provider]  albuterol (PROVENTIL HFA) 108 (90 Base) MCG/ACT inhaler  Inhale 2 puffs into the lungs every 4 (four) hours as needed for wheezing or shortness of breath. 12/12/16  Yes Carrie Mew, MD  aspirin-acetaminophen-caffeine (EXCEDRIN MIGRAINE) [phone] MG tablet Take 3 tablets by mouth every 8 (eight) hours as needed for headache or migraine.   Yes  [provider]  atorvastatin (LIPITOR) 40 MG tablet Take 40 mg by mouth daily. 07/11/15  Yes [provider]  bisacodyl (DULCOLAX) 5 MG EC tablet Take 5 mg by mouth daily as needed for moderate constipation.   Yes [provider]  buPROPion (WELLBUTRIN SR) 150 MG 12 hr tablet Take 300 mg by mouth daily. 10/30/15  Yes [provider]  Cholecalciferol (VITAMIN D3) 1000 units CAPS Take 1 capsule by mouth daily.   Yes [provider]  clonazePAM (KLONOPIN) 1 MG tablet Take 1 mg by mouth daily as needed. 12/07/16  Yes [provider]  dabigatran (PRADAXA) 150 MG CAPS capsule Take 150 mg by mouth 2 (two) times daily. 10/12/16 10/12/17 Yes [provider]  DULoxetine (CYMBALTA) 60 MG capsule Take 60 mg by mouth 2 (two) times daily. 03/15/11  Yes [provider]  esomeprazole (NEXIUM) 40 MG capsule Take 40 mg by mouth daily.   Yes [provider]  hydrOXYzine (ATARAX/VISTARIL) 50 MG tablet Take 50 mg by mouth 2 (two) times daily. 11/06/15  Yes [provider]  levothyroxine (SYNTHROID, LEVOTHROID) 100 MCG tablet Take 100 mcg by mouth daily. 08/18/15  Yes [provider]  loratadine (CLARITIN) 10 MG tablet Take 10 mg by mouth daily as needed for allergies.   Yes [provider]  MAGNESIUM PO Take 500 mg by mouth 2 (two) times daily.   Yes [provider]  metFORMIN (GLUCOPHAGE) 500 MG tablet Take 500 mg by mouth 2 (two) times daily.   Yes [provider]  Milnacipran (SAVELLA) 50 MG TABS tablet Take 50 mg by mouth 2 (two) times daily.  10/08/16 10/08/17 Yes [provider]  Misc Natural Products ([place] MSM FORMULA PO) Take 1 tablet by mouth 2 (two) times daily.   Yes [provider]  ondansetron (ZOFRAN-ODT) 4 MG disintegrating tablet Take 4 mg by mouth every 8 (eight) hours as needed for nausea or vomiting.   Yes [provider]  promethazine (PHENERGAN)  25 MG tablet Take 25 mg by mouth every 8 (eight) hours as needed for nausea or vomiting.   Yes [provider]  quinapril (ACCUPRIL) 10 MG tablet Take 10 mg by mouth daily. 05/01/13  Yes [provider]  ranitidine (ZANTAC) 150 MG tablet Take 150 mg by mouth 2 (two) times daily. 01/20/15  Yes [provider]  rizatriptan (MAXALT) 10 MG tablet Take 1 tab at migraine onset. May repeat in 2 hours prn.  Limit to 2 in 24 hours and to 2 days weekly. 09/09/15  Yes [provider]  sitaGLIPtin (JANUVIA) 100 MG tablet Take 100 mg by mouth daily.   Yes [provider]  tapentadol (NUCYNTA) 100 MG 12 hr tablet Take 100 mg by mouth 2 (two) times daily. 12/28/16 01/27/17 Yes [provider]  verapamil (VERELAN PM) 180 MG 24 hr capsule Take 180 mg by mouth daily. 01/16/07  Yes [provider]  zolpidem (AMBIEN) 10 MG tablet Take 10 mg by mouth daily. 12/16/16 01/15/17 Yes [provider]  guaiFENesin (ROBITUSSIN) 100 MG/5ML SOLN Take 5 mLs (100 mg total) by mouth every 4 (four) hours as needed for cough or to loosen  phlegm. Patient not taking: Reported on 01/03/2017 12/12/16   Carrie Mew, MD  predniSONE (DELTASONE) 20 MG tablet Take 2 tablets (40 mg total) by mouth daily. Patient not taking: Reported on 01/03/2017 12/12/16   Carrie Mew, MD  Spacer/Aero-Holding Chambers ([place]) inhaler Use as instructed 12/12/16   Carrie Mew, MD      VITAL SIGNS:  Blood pressure 100/88, pulse 75, resp. rate (!) 23, height 5\' 6"  (1.676 m), weight 108.9 kg (240 lb), SpO2 99 %.  PHYSICAL EXAMINATION:  Physical Exam  GENERAL:  51 y.o.-year-old patient lying in the bed in mild Resp. Distress.   EYES: Pupils equal, round, reactive to light and accommodation. No scleral icterus. Extraocular muscles intact.  HEENT: Head atraumatic, normocephalic. Oropharynx and nasopharynx clear. No oropharyngeal erythema, moist oral mucosa  NECK:   Supple, no jugular venous distention. No thyroid enlargement, no tenderness.  LUNGS: Good A/E b/l, no wheezing, rales, rhonchi. No use of accessory muscles of respiration.  CARDIOVASCULAR: S1, S2 RRR. No murmurs, rubs, gallops, clicks.  ABDOMEN: Soft, nontender, nondistended. Bowel sounds present. No organomegaly or mass.  EXTREMITIES: No pedal edema, cyanosis, or clubbing. + 2 pedal & radial pulses b/l.   NEUROLOGIC: Cranial nerves II through XII are intact. No focal Motor or sensory deficits appreciated b/l PSYCHIATRIC: The patient is alert and oriented x 3. Anxious.  SKIN: No obvious rash, lesion, or ulcer.   LABORATORY PANEL:   CBC  Recent Labs Lab 01/03/17 1323  WBC 15.3*  HGB 14.5  HCT 44.3  PLT 340   ------------------------------------------------------------------------------------------------------------------  Chemistries   Recent Labs Lab 01/03/17 1323  NA 137  K 4.4  CL 103  CO2 20*  GLUCOSE 196*  BUN 12  CREATININE 1.26*  CALCIUM 9.4   ------------------------------------------------------------------------------------------------------------------  Cardiac Enzymes  Recent Labs Lab 01/03/17 1323  TROPONINI <0.03   ------------------------------------------------------------------------------------------------------------------  RADIOLOGY:  Dg Chest Portable 1 View  Result Date: 01/03/2017 CLINICAL DATA:  Respiratory distress. History of diabetes and hypertension. Nonsmoker. EXAM: PORTABLE CHEST 1 VIEW COMPARISON:  Chest x-ray and chest CT scan of December 12, 2016 FINDINGS: The lungs are adequately inflated. There is increased density at the left lung base which appears new. There is no pneumothorax or pneumomediastinum. There is no large pleural effusion. The cardiac silhouette is enlarged. The pulmonary vascularity is not clearly engorged. An external pacemaker defibrillator pad is present. There is a tunneled catheter posteriorly in the left  paravertebral region. IMPRESSION: Left lower lobe atelectasis or early pneumonia. Moderate cardiomegaly without significant pulmonary vascular congestion. Followup PA and lateral chest X-ray is recommended in 3-4 weeks following trial of antibiotic therapy to ensure resolution and exclude underlying malignancy. Electronically Signed   By: David  Martinique M.D.   On: 01/03/2017 14:00     IMPRESSION AND PLAN:   51 year old female with past medical history of thyroidism, anxiety/depression, chronic pain, hypertension, diabetes, history of migraines, history of cerebral vein thrombosis, prothrombin gene mutation who presents to the hospital due to shortness of breath.  1. Shortness of breath-suspected to be secondary to pneumonia seen on chest x-ray combined with patient being in complete heart block which has not resolved. -We'll treat the patient with IV Levaquin, follow clinically. Patient is not on oxygen at home.  2. Pneumonia-suspected to be community-acquired pneumonia. We'll treat with IV Levaquin 5 days.  -follow blood, sputum cultures.  3. Complete heart block-patient came into the hospital bradycardic in complete heart block and subsequently converted back to a sinus rhythm.  She is presently hemodynamically stable. We'll keep on telemetry, cycle her cardiac markers, check her echocardiogram. -I'll also get a cardiology consult. I will hold her verapamil. It's unclear whether she takes 180 mg daily with 3 tablets of the 180 mg daily.  4. Hypothyroidism-continue Synthroid.  5. Anxiety/depression-continue Cymbalta, Klonopin, Wellbutrin.  6. Hyperlipidemia-continue atorvastatin.  7. 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 GERD-continue Protonix.  9. Chronic pain syndrome-continue Nucynta.  10. HTN - cont. Quinapril  11. DM Type II w/out complication - cont. Metformin, Tradjenta, Januvia.    All the records are reviewed and case  discussed with ED provider. Management plans discussed with the patient, family and they are in agreement.  CODE STATUS: Full code  TOTAL TIME TAKING CARE OF THIS PATIENT: 45 minutes.    Henreitta Leber M.D on 01/03/2017 at 3:51 PM  Between 7am to 6pm - Pager - [phone]  After 6pm go to [email or web address] - password [email or web address] [place] [facility]  Office  [phone]  CC: Primary care physician; Zhou-Talbert, Elwyn Lade, MD

## 2017-01-03 NOTE — ED Notes (Signed)
Recollect lavender top sent to lab at this time.

## 2017-01-03 NOTE — Progress Notes (Signed)
Patient admitted to the floor from ED, for respiratory distress, patient alert and oriented, denies any pain at this time. VSS, patient on oxygen 4 L [place] tolerating

## 2017-01-03 NOTE — ED Notes (Signed)
Pt placed on zoll pads by this RN. MD aware pt's HR 43, BP 100/78.

## 2017-01-03 NOTE — ED Provider Notes (Addendum)
[facility] Emergency Department Provider Note  Time seen: 1:28 PM  I have reviewed the triage vital signs and the nursing notes.   HISTORY  Chief Complaint Shortness of Breath    HPI Christine Mcneil is a 51 y.o. female Diabetes, hypertension, chronic pain,who presents to the emergency departmentfor difficulty breathing. According to the patient she awoke this morning feeling okay shortly afterwards she became very short of breath. Tried albuterol at home without relief and called EMS. EMS states they were unable to get a room air respiratory rate due to agitation/anxiety. On oxygen patient has a saturation in the upper 90s. Denies any chest pain. patient describes shortness of breath as severe. Of note the patient's heart rate is in the 40s patient states normally it is near 90-100.  Past Medical History:  Diagnosis Date  . Diabetes mellitus without complication ([place])   . Hypertension   . Thyroid disease     There are no active problems to display for this patient.   Past Surgical History:  Procedure Laterality Date  . ABDOMINAL HYSTERECTOMY    . CHOLECYSTECTOMY    . ROTATOR CUFF REPAIR Right     Prior to Admission medications   Medication Sig Start Date End Date Taking? Authorizing Provider  acetaZOLAMIDE (DIAMOX) 500 MG capsule Take 500 mg by mouth daily. 11/18/16   [provider]  albuterol (PROVENTIL HFA) 108 (90 Base) MCG/ACT inhaler Inhale 2 puffs into the lungs every 4 (four) hours as needed for wheezing or shortness of breath. 12/12/16   Carrie Mew, MD  atorvastatin (LIPITOR) 40 MG tablet Take 40 mg by mouth daily. 07/11/15   [provider]  buPROPion (WELLBUTRIN SR) 150 MG 12 hr tablet Take 300 mg by mouth daily. 10/30/15   [provider]  clonazePAM (KLONOPIN) 1 MG tablet Take 1 mg by mouth daily as needed. 12/07/16   [provider]  dabigatran (PRADAXA) 150 MG CAPS capsule Take 150 mg by mouth 2 (two)  times daily. 10/12/16 10/12/17  [provider]  DULoxetine (CYMBALTA) 60 MG capsule Take 60 mg by mouth 2 (two) times daily. 03/15/11   [provider]  esomeprazole (NEXIUM) 40 MG capsule Take 40 mg by mouth daily.    [provider]  guaiFENesin (ROBITUSSIN) 100 MG/5ML SOLN Take 5 mLs (100 mg total) by mouth every 4 (four) hours as needed for cough or to loosen phlegm. 12/12/16   Carrie Mew, MD  hydrOXYzine (ATARAX/VISTARIL) 50 MG tablet Take 50 mg by mouth 2 (two) times daily. 11/06/15   [provider]  levothyroxine (SYNTHROID, LEVOTHROID) 100 MCG tablet Take 100 mcg by mouth daily. 08/18/15   [provider]  MAGNESIUM PO Take 500 mg by mouth 2 (two) times daily.    [provider]  metFORMIN (GLUCOPHAGE) 500 MG tablet Take 500 mg by mouth 2 (two) times daily.    [provider]  predniSONE (DELTASONE) 20 MG tablet Take 2 tablets (40 mg total) by mouth daily. 12/12/16   Carrie Mew, MD  quinapril (ACCUPRIL) 10 MG tablet Take 10 mg by mouth daily. 05/01/13   [provider]  ranitidine (ZANTAC) 150 MG tablet Take 150 mg by mouth 2 (two) times daily. 01/20/15   [provider]  Spacer/Aero-Holding Chambers (AEROCHAMBER PLUS WITH MASK) inhaler Use as instructed 12/12/16   Carrie Mew, MD  tapentadol (NUCYNTA) 100 MG 12 hr tablet Take 100 mg by mouth 2 (two) times daily. 12/28/16 01/27/17  [provider]  verapamil (CALAN-SR) 180 MG CR tablet Take 180 mg by mouth daily. 01/16/07   [provider]  zolpidem (AMBIEN) 10 MG tablet Take 10 mg by mouth daily. 12/16/16 01/15/17  [provider]    Allergies  Allergen Reactions  . Amoxapine And Related Itching  . Keflex [Cephalexin] Itching    Family History  Problem Relation Age of Onset  . Diabetes Mother   . Hyperlipidemia Mother   . Hypertension Mother   . COPD Mother   . Diabetes Father   . Hyperlipidemia Father   .  Hypertension Father   . Thyroid disease Father     Social History Social History  Substance Use Topics  . Smoking status: Never Smoker  . Smokeless tobacco: Never Used  . Alcohol use No    Review of Systems Constitutional: Negative for fever. Cardiovascular: Negative for chest pain. Respiratory: positive for shortness of breath Gastrointestinal: Negative for abdominal pain Musculoskeletal: Negative for back pain. Neurological: Negative for headache All other ROS negative  ____________________________________________   PHYSICAL EXAM:  VITAL SIGNS: ED Triage Vitals  Enc Vitals Group     BP --      Pulse Rate 01/03/17 1319 (!) 43     Resp 01/03/17 1319 (!) 26     Temp --      Temp src --      SpO2 01/03/17 1318 99 %     Weight 01/03/17 1319 240 lb (108.9 kg)     Height 01/03/17 1319 5\' 6"  (1.676 m)     Head Circumference --      Peak Flow --      Pain Score 01/03/17 1318 0     Pain Loc --      Pain Edu? --      Excl. in [place]? --     Constitutional: Alert and oriented. Well appearing and in no distress. Eyes: Normal exam ENT   Head: Normocephalic and atraumatic.   Mouth/Throat: Mucous membranes are moist. Cardiovascular: regular rhythm, rate around 50 bpm. No obvious murmur. Respiratory: atient is moderately tachypnea has clear breath sounds bilaterally without obvious wheezes rales or rhonchi. Gastrointestinal: Soft and nontender. No distention.  Musculoskeletal: Nontender with normal range of motion in all extremities. Neurologic:  Normal speech and language. No gross focal neurologic deficits  Skin:  Skin is warm, dry and intact.  Psychiatric: Mood and affect are normal.   ____________________________________________    EKG  EKG reviewed and interpreted by myself appears to show a complete heart block at 48 bpm. Patient has a narrow QRS, normal axis. Intervals are largely within normal limits besides PR interval. No significant ST changes.  EKG #2  reviewed and interpreted by myself shows a continued completely heart block at 47 bpm. Narrow QRS, normal axis, large the normal intervals besides PR interval, no concerning ST changes.  EKG #3. An interpreted by myself shows normal sinus rhythm at 78 bpm with a slightly widened QRS, right axis deviation, nonspecific ST changes.    ____________________________________________    RADIOLOGY  IMPRESSION: Left lower lobe atelectasis or early pneumonia. Moderate cardiomegaly without significant pulmonary vascular congestion. Followup PA and lateral chest X-ray is recommended in 3-4 weeks following trial of antibiotic therapy to ensure resolution and exclude underlying malignancy.  ____________________________________________   INITIAL IMPRESSION / ASSESSMENT AND PLAN / ED COURSE  Pertinent labs & imaging results that were available during my care of the patient were reviewed by me and considered in my  medical decision making (see chart for details).  patient presents in the emergency department for shortness of breath beginning this morning which has progressively worsened over the morning hours. On arrival the patient is moderately anxious appearing with moderate dyspnea. Wearing a nonrebreather satting in the upper 90s. Patient's heart rate is noted to be in the 40s. EKG consistent with complete heart block. Patient denies any chest pain. We will check labs, chest x-ray. We will keep on a cardiac monitor to closely follow.  I discussed the patient with cardiology they recommend admission to the intensive care unit for further monitoring. Patient takes a large dose of verapamil which could be the cause of her complete heart block.  ----------------------------------------- 1:50 PM on 01/03/2017 -----------------------------------------   Patient's repeat EKG appears to show a normal sinus rhythm at this time 78 bpm with nonspecific ST changes. labs and x-ray are pending.   CRITICAL  CARE Performed by: Harvest Dark   Total critical care time: 30 minutes  Critical care time was exclusive of separately billable procedures and treating other patients.  Critical care was necessary to treat or prevent imminent or life-threatening deterioration.  Critical care was time spent personally by me on the following activities: development of treatment plan with patient and/or surrogate as well as nursing, discussions with consultants, evaluation of patient's response to treatment, examination of patient, obtaining history from patient or surrogate, ordering and performing treatments and interventions, ordering and review of laboratory studies, ordering and review of radiographic studies, pulse oximetry and re-evaluation of patient's condition.   patient's x-ray shows possible pneumonia versus atelectasis. We will cover with antibiotics. I placed the patient on nasal cannula oxygen and she is tolerating well 98%. Patient remains in a normal sinus rhythm however at times it appears that she might be in atrial fibrillation. We will admit to the hospital for further treatment.   ____________________________________________   FINAL CLINICAL IMPRESSION(S) / ED DIAGNOSES  complete heart block Dyspnea community acquired pneumonia   Harvest Dark, MD 01/03/17 1440    Harvest Dark, MD 01/03/17 1501

## 2017-01-03 NOTE — ED Triage Notes (Signed)
Pt in via [facility] with complaints of shortness of breath, per EMS, room air sat unknown.  Pt on NRB mask upon arrival, when taken off of mask, pt immediately appears in respiratory distress.  Pt bradycardic upon arrival as well.  Pt A/Ox4.  MD to bedside at this time.

## 2017-01-03 NOTE — ED Notes (Addendum)
Duplicate documentation; note removed.

## 2017-01-03 NOTE — ED Notes (Signed)
Pt heart rate has converted back to NSR, heart rate currently in 70's.  Repeat EKG performed.  MD notified.

## 2017-01-04 ENCOUNTER — Inpatient Hospital Stay
Admit: 2017-01-04 | Discharge: 2017-01-04 | Disposition: A | Discharge status: Home / Self Care | Payer: Medicare Other | Attending: Specialist | Admitting: Specialist

## 2017-01-04 LAB — BASIC METABOLIC PANEL
Anion gap: 10 (ref 5–15)
BUN: 14 mg/dL (ref 6–20)
CALCIUM: 9.2 mg/dL (ref 8.9–10.3)
CO2: 25 mmol/L (ref 22–32)
CREATININE: 1.21 mg/dL — AB (ref 0.44–1.00)
Chloride: 104 mmol/L (ref 101–111)
GFR calc Af Amer: 59 mL/min — ABNORMAL LOW (ref >60)
GFR, EST NON AFRICAN AMERICAN: 51 mL/min — AB (ref >60)
GLUCOSE: 172 mg/dL — AB (ref 65–99)
Potassium: 3.7 mmol/L (ref 3.5–5.1)
Sodium: 139 mmol/L (ref 135–145)

## 2017-01-04 LAB — GLUCOSE, CAPILLARY
Glucose-Capillary: 174 mg/dL — ABNORMAL HIGH (ref 65–99)
Glucose-Capillary: 169 mg/dL — ABNORMAL HIGH (ref 65–99)

## 2017-01-04 LAB — CBC
HCT: 40.3 % (ref 35.0–47.0)
HEMOGLOBIN: 13.3 g/dL (ref 12.0–16.0)
MCH: 27.3 pg (ref 26.0–34.0)
MCHC: 33.1 g/dL (ref 32.0–36.0)
MCV: 82.7 fL (ref 80.0–100.0)
PLATELETS: 272 10*3/uL (ref 150–440)
RBC: 4.88 MIL/uL (ref 3.80–5.20)
RDW: 16.2 % — AB (ref 11.5–14.5)
WBC: 9 10*3/uL (ref 3.6–11.0)

## 2017-01-04 LAB — TROPONIN I

## 2017-01-04 LAB — MAGNESIUM: MAGNESIUM: 2.1 mg/dL (ref 1.7–2.4)

## 2017-01-04 LAB — TSH: TSH: 3.176 u[IU]/mL (ref 0.350–4.500)

## 2017-01-04 MED ORDER — INSULIN ASPART 100 UNIT/ML ~~LOC~~ SOLN
0.0000 [IU] | Freq: Three times a day (TID) | SUBCUTANEOUS | Status: DC
Start: 2017-01-04 — End: 2017-01-06
  Administered 2017-01-04 – 2017-01-06 (×6): 2 [IU] via SUBCUTANEOUS
  Filled 2017-01-04 (×6): qty 1

## 2017-01-04 MED ORDER — LEVOFLOXACIN 750 MG PO TABS
750.0000 mg | ORAL_TABLET | Freq: Every day | ORAL | Status: DC
  Administered 2017-01-04 – 2017-01-05 (×2): 750 mg via ORAL
  Filled 2017-01-04 (×2): qty 1

## 2017-01-04 MED ORDER — RIZATRIPTAN BENZOATE 10 MG PO TABS
10.0000 mg | ORAL_TABLET | Freq: Once | ORAL | Status: AC | PRN
  Administered 2017-01-04: 10 mg via ORAL
  Filled 2017-01-04: qty 1

## 2017-01-04 MED ORDER — NON FORMULARY: 1.0000 | Status: DC

## 2017-01-04 NOTE — Progress Notes (Signed)
[facility] NAME: Christine Mcneil    MR#:  [id number]  DATE OF BIRTH:  1965-11-24  SUBJECTIVE:  CHIEF COMPLAINT:   Chief Complaint  Patient presents with  . Shortness of Breath   Came with SOB and also had sinus pauses, which is stable now on tele. Pt said, she was prescribed to take 3 tablets of 180 mg of verapamil. Also have pneumonia.  REVIEW OF SYSTEMS:  CONSTITUTIONAL: No fever, fatigue or weakness.  EYES: No blurred or double vision.  EARS, NOSE, AND THROAT: No tinnitus or ear pain.  RESPIRATORY: positive for cough, shortness of breath, wheezing ,no  hemoptysis.  CARDIOVASCULAR: No chest pain, orthopnea, edema.  GASTROINTESTINAL: No nausea, vomiting, diarrhea or abdominal pain.  GENITOURINARY: No dysuria, hematuria.  ENDOCRINE: No polyuria, nocturia,  HEMATOLOGY: No anemia, easy bruising or bleeding SKIN: No rash or lesion. MUSCULOSKELETAL: No joint pain or arthritis.   NEUROLOGIC: No tingling, numbness, weakness.  PSYCHIATRY: No anxiety or depression.   ROS  DRUG ALLERGIES:   Allergies  Allergen Reactions  . Amoxicillin Itching  . Keflex [Cephalexin] Itching    VITALS:  Blood pressure 114/70, pulse 94, temperature 98 F (36.7 C), temperature source Oral, resp. rate 18, height 5\' 6"  (1.676 m), weight 108.9 kg (240 lb), SpO2 100 %.  PHYSICAL EXAMINATION:  GENERAL:  51 y.o.-year-old patient lying in the bed with no acute distress.  EYES: Pupils equal, round, reactive to light and accommodation. No scleral icterus. Extraocular muscles intact.  HEENT: Head atraumatic, normocephalic. Oropharynx and nasopharynx clear.  NECK:  Supple, no jugular venous distention. No thyroid enlargement, no tenderness.  LUNGS: Normal breath sounds bilaterally, no wheezing, some crepitation. No use of accessory muscles of respiration.  CARDIOVASCULAR: S1, S2 normal. No murmurs, rubs, or gallops.  ABDOMEN: Soft, nontender, nondistended.  Bowel sounds present. No organomegaly or mass.  EXTREMITIES: No pedal edema, cyanosis, or clubbing.  NEUROLOGIC: Cranial nerves II through XII are intact. Muscle strength 5/5 in all extremities. Sensation intact. Gait not checked.  PSYCHIATRIC: The patient is alert and oriented x 3.  SKIN: No obvious rash, lesion, or ulcer.   Physical Exam LABORATORY PANEL:   CBC  Recent Labs Lab 01/04/17 0145  WBC 9.0  HGB 13.3  HCT 40.3  PLT 272   ------------------------------------------------------------------------------------------------------------------  Chemistries   Recent Labs Lab 01/04/17 0145  NA 139  K 3.7  CL 104  CO2 25  GLUCOSE 172*  BUN 14  CREATININE 1.21*  CALCIUM 9.2  MG 2.1   ------------------------------------------------------------------------------------------------------------------  Cardiac Enzymes  Recent Labs Lab 01/03/17 2148 01/04/17 0145  TROPONINI <0.03 <0.03   ------------------------------------------------------------------------------------------------------------------  RADIOLOGY:  Dg Chest Portable 1 View  Result Date: 01/03/2017 CLINICAL DATA:  Respiratory distress. History of diabetes and hypertension. Nonsmoker. EXAM: PORTABLE CHEST 1 VIEW COMPARISON:  Chest x-ray and chest CT scan of December 12, 2016 FINDINGS: The lungs are adequately inflated. There is increased density at the left lung base which appears new. There is no pneumothorax or pneumomediastinum. There is no large pleural effusion. The cardiac silhouette is enlarged. The pulmonary vascularity is not clearly engorged. An external pacemaker defibrillator pad is present. There is a tunneled catheter posteriorly in the left paravertebral region. IMPRESSION: Left lower lobe atelectasis or early pneumonia. Moderate cardiomegaly without significant pulmonary vascular congestion. Followup PA and lateral chest X-ray is recommended in 3-4 weeks following trial of antibiotic therapy to  ensure resolution and exclude underlying malignancy. Electronically Signed  By: David  Martinique M.D.   On: 01/03/2017 14:00    ASSESSMENT AND PLAN:   Active Problems:   Shortness of breath  1. Shortness of breath-suspected to be secondary to pneumonia seen on chest x-ray combined with patient being in complete heart block which has now resolved. - IV Levaquin, follow clinically. Patient is not on oxygen at home.  2. Pneumonia-suspected to be community-acquired pneumonia.    treat with IV Levaquin 5 days.  -follow blood, sputum cultures.  3. Complete heart block-patient came into the hospital bradycardic in complete heart block and subsequently converted back to a sinus rhythm. She is presently hemodynamically stable.   on telemetry, cycle her cardiac markers, check her echocardiogram. - a cardiology consult.  hold her verapamil.  - She was taking 3 tablets of 180 mg verapamil daily.  4. Hypothyroidism-continue Synthroid.  5. Anxiety/depression-continue Cymbalta, Klonopin, Wellbutrin.  6. Hyperlipidemia-continue atorvastatin.  7. 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 GERD-continue Protonix.  9. Chronic pain syndrome-continue Nucynta.  10. HTN - cont. Quinapril  11. DM Type II w/out complication - cont. Metformin, Tradjenta, Januvia.       All the records are reviewed and case discussed with Care Management/Social Workerr. Management plans discussed with the patient, family and they are in agreement.  CODE STATUS: full.  TOTAL TIME TAKING CARE OF THIS PATIENT: 35 minutes.     POSSIBLE D/C IN 1-2 DAYS, DEPENDING ON CLINICAL CONDITION.   [name] M.D on 01/04/2017   Between 7am to 6pm - Pager - [phone]  After 6pm go to [email or web address] - password [email or web address] [facility]  Office  [phone]  CC: Primary care physician; Zhou-Talbert, Elwyn Lade, MD  Note: This dictation  was prepared with Dragon dictation along with smaller phrase technology. Any transcriptional errors that result from this process are unintentional.

## 2017-01-04 NOTE — Consult Note (Signed)
Reason for Consult: complete heart block bradycardia Referring Physician: Dr Curtis Sites  /Dr [facility] hospitalist  Christine Mcneil is an 51 y.o. female.  HPI: 52 year old female history of diabetes hypertension cerebral vein thrombosis prothrombin gene mutation obesity hypothyroidism anxiety depression patient presents with not feeling well at home and checked her blood pressure and found that her heart rate was in the 40s to she called EMS and came to emergency room. In emergency room she was found to be in complete heart block by history she was taken 3 180 mg verapamil pills. Blood pressure was reasonably stable denied any chest pain no nausea vomiting or diarrhea  Past Medical History:  Diagnosis Date  . Anxiety and depression   . Chronic pain   . Diabetes mellitus without complication ([place])   . Hypertension   . Migraines   . Prothrombin gene mutation ([place])   . Thyroid disease     Past Surgical History:  Procedure Laterality Date  . ABDOMINAL HYSTERECTOMY    . CHOLECYSTECTOMY    . ROTATOR CUFF REPAIR Right     Family History  Problem Relation Age of Onset  . Diabetes Mother   . Hyperlipidemia Mother   . Hypertension Mother   . COPD Mother   . Diabetes Father   . Hyperlipidemia Father   . Hypertension Father   . Thyroid disease Father     Social History:  reports that she has never smoked. She has never used smokeless tobacco. She reports that she does not drink alcohol or use drugs.  Allergies:  Allergies  Allergen Reactions  . Amoxicillin Itching  . Keflex [Cephalexin] Itching    Medications: I have reviewed the patient's current medications.  Results for orders placed or performed during the hospital encounter of 01/03/17 (from the past 48 hour(s))  Troponin I     Status: None   Collection Time: 01/03/17  1:23 PM  Result Value Ref Range   Troponin I <0.03 <0.03 ng/mL  Basic metabolic panel     Status: Abnormal   Collection Time: 01/03/17  1:23 PM   Result Value Ref Range   Sodium 137 135 - 145 mmol/L   Potassium 4.4 3.5 - 5.1 mmol/L   Chloride 103 101 - 111 mmol/L   CO2 20 (L) 22 - 32 mmol/L   Glucose, Bld 196 (H) 65 - 99 mg/dL   BUN 12 6 - 20 mg/dL   Creatinine, Ser 1.26 (H) 0.44 - 1.00 mg/dL   Calcium 9.4 8.9 - 10.3 mg/dL   GFR calc non Af Amer 48 (L) >60 mL/min   GFR calc Af Amer 56 (L) >60 mL/min    Comment: (NOTE) The eGFR has been calculated using the CKD EPI equation. This calculation has not been validated in all clinical situations. eGFR's persistently <60 mL/min signify possible Chronic Kidney Disease.    Anion gap 14 5 - 15  CBC     Status: Abnormal   Collection Time: 01/03/17  1:23 PM  Result Value Ref Range   WBC 15.3 (H) 3.6 - 11.0 K/uL   RBC 5.32 (H) 3.80 - 5.20 MIL/uL   Hemoglobin 14.5 12.0 - 16.0 g/dL   HCT 44.3 35.0 - 47.0 %   MCV 83.3 80.0 - 100.0 fL   MCH 27.2 26.0 - 34.0 pg   MCHC 32.7 32.0 - 36.0 g/dL   RDW 16.0 (H) 11.5 - 14.5 %   Platelets 340 150 - 440 K/uL  Brain natriuretic peptide  Status: None   Collection Time: 01/03/17  2:30 PM  Result Value Ref Range   B Natriuretic Peptide 40.0 0.0 - 100.0 pg/mL  Blood culture (routine x 2)     Status: None (Preliminary result)   Collection Time: 01/03/17  3:37 PM  Result Value Ref Range   Specimen Description BLOOD BLOOD LEFT WRIST    Special Requests      BOTTLES DRAWN AEROBIC AND ANAEROBIC Blood Culture adequate volume   Culture NO GROWTH < 24 HOURS    Report Status PENDING   Blood culture (routine x 2)     Status: None (Preliminary result)   Collection Time: 01/03/17  3:37 PM  Result Value Ref Range   Specimen Description BLOOD BLOOD LEFT FOREARM    Special Requests      BOTTLES DRAWN AEROBIC AND ANAEROBIC Blood Culture adequate volume   Culture NO GROWTH < 24 HOURS    Report Status PENDING   Urinalysis, Routine w reflex microscopic     Status: Abnormal   Collection Time: 01/03/17  3:37 PM  Result Value Ref Range   Color, Urine  YELLOW (A) YELLOW   APPearance CLEAR (A) CLEAR   Specific Gravity, Urine 1.016 1.005 - 1.030   pH 7.0 5.0 - 8.0   Glucose, UA NEGATIVE NEGATIVE mg/dL   Hgb urine dipstick NEGATIVE NEGATIVE   Bilirubin Urine NEGATIVE NEGATIVE   Ketones, ur NEGATIVE NEGATIVE mg/dL   Protein, ur NEGATIVE NEGATIVE mg/dL   Nitrite NEGATIVE NEGATIVE   Leukocytes, UA NEGATIVE NEGATIVE  Troponin I     Status: None   Collection Time: 01/03/17  6:13 PM  Result Value Ref Range   Troponin I <0.03 <0.03 ng/mL  Glucose, capillary     Status: Abnormal   Collection Time: 01/03/17  8:35 PM  Result Value Ref Range   Glucose-Capillary 138 (H) 65 - 99 mg/dL  Troponin I     Status: None   Collection Time: 01/03/17  9:48 PM  Result Value Ref Range   Troponin I <0.03 <0.03 ng/mL  Basic metabolic panel     Status: Abnormal   Collection Time: 01/04/17  1:45 AM  Result Value Ref Range   Sodium 139 135 - 145 mmol/L   Potassium 3.7 3.5 - 5.1 mmol/L   Chloride 104 101 - 111 mmol/L   CO2 25 22 - 32 mmol/L   Glucose, Bld 172 (H) 65 - 99 mg/dL   BUN 14 6 - 20 mg/dL   Creatinine, Ser 1.21 (H) 0.44 - 1.00 mg/dL   Calcium 9.2 8.9 - 10.3 mg/dL   GFR calc non Af Amer 51 (L) >60 mL/min   GFR calc Af Amer 59 (L) >60 mL/min    Comment: (NOTE) The eGFR has been calculated using the CKD EPI equation. This calculation has not been validated in all clinical situations. eGFR's persistently <60 mL/min signify possible Chronic Kidney Disease.    Anion gap 10 5 - 15  CBC     Status: Abnormal   Collection Time: 01/04/17  1:45 AM  Result Value Ref Range   WBC 9.0 3.6 - 11.0 K/uL   RBC 4.88 3.80 - 5.20 MIL/uL   Hemoglobin 13.3 12.0 - 16.0 g/dL   HCT 40.3 35.0 - 47.0 %   MCV 82.7 80.0 - 100.0 fL   MCH 27.3 26.0 - 34.0 pg   MCHC 33.1 32.0 - 36.0 g/dL   RDW 16.2 (H) 11.5 - 14.5 %   Platelets 272 150 - 440  K/uL  Troponin I     Status: None   Collection Time: 01/04/17  1:45 AM  Result Value Ref Range   Troponin I <0.03 <0.03 ng/mL   Magnesium     Status: None   Collection Time: 01/04/17  1:45 AM  Result Value Ref Range   Magnesium 2.1 1.7 - 2.4 mg/dL  TSH     Status: None   Collection Time: 01/04/17  1:45 AM  Result Value Ref Range   TSH 3.176 0.350 - 4.500 uIU/mL    Comment: Performed by a 3rd Generation assay with a functional sensitivity of <=0.01 uIU/mL.    Dg Chest Portable 1 View  Result Date: 01/03/2017 CLINICAL DATA:  Respiratory distress. History of diabetes and hypertension. Nonsmoker. EXAM: PORTABLE CHEST 1 VIEW COMPARISON:  Chest x-ray and chest CT scan of December 12, 2016 FINDINGS: The lungs are adequately inflated. There is increased density at the left lung base which appears new. There is no pneumothorax or pneumomediastinum. There is no large pleural effusion. The cardiac silhouette is enlarged. The pulmonary vascularity is not clearly engorged. An external pacemaker defibrillator pad is present. There is a tunneled catheter posteriorly in the left paravertebral region. IMPRESSION: Left lower lobe atelectasis or early pneumonia. Moderate cardiomegaly without significant pulmonary vascular congestion. Followup PA and lateral chest X-ray is recommended in 3-4 weeks following trial of antibiotic therapy to ensure resolution and exclude underlying malignancy. Electronically Signed   By: David  Martinique M.D.   On: 01/03/2017 14:00    Review of Systems  Constitutional: Positive for malaise/fatigue.  HENT: Positive for congestion.   Eyes: Negative.   Respiratory: Positive for shortness of breath.   Cardiovascular: Positive for palpitations, orthopnea and PND.  Gastrointestinal: Negative.   Genitourinary: Negative.   Musculoskeletal: Positive for myalgias.  Skin: Negative.   Neurological: Positive for weakness.  Endo/Heme/Allergies: Negative.    Blood pressure 129/86, pulse 91, temperature (!) 97.5 F (36.4 C), temperature source Oral, resp. rate 18, height 5' 6"  (1.676 m), weight 108.9 kg (240 lb), SpO2 97  %. Physical Exam  Nursing note and vitals reviewed. Constitutional: She is oriented to person, place, and time. She appears well-developed and well-nourished.  HENT:  Head: Normocephalic and atraumatic.  Eyes: Pupils are equal, round, and reactive to light. Conjunctivae and EOM are normal.  Neck: Normal range of motion. Neck supple.  Cardiovascular: Regular rhythm, S1 normal, S2 normal and normal pulses.  Bradycardia present.  PMI is displaced.   Murmur heard. Respiratory: Effort normal and breath sounds normal.  GI: Soft. Bowel sounds are normal.  Musculoskeletal: Normal range of motion.  Neurological: She is alert and oriented to person, place, and time. She has normal reflexes.  Skin: Skin is warm and dry.  Psychiatric: She has a normal mood and affect.    Assessment/Plan: Complete heart block resolved bradycardia Hypertension Diabetes type 2 Obesity Anxiety Prothrombin gene mutation Thyroid disease Depression Chronic pain . PLAN Agree with discontinuing or reducing verapamil Continue blood pressure control with a non-ate blocking drug Continue hyperlipidemia therapy with Lipitor Anticoagulation with Pradaxa GERD therapy should be continued with Nexium Continue Glucophage Gen. via therapy for diabetes management  Dwayne D Callwood 01/04/2017, 5:05 PM

## 2017-01-04 NOTE — Progress Notes (Signed)
Pt requesting more tylenol for migraine, however it's not time for another dose. Pt states she takes rizatriptan at home, MD paged. Dr. Verlon Setting gave orders to order pt's medication how she takes it at home.

## 2017-01-04 NOTE — Progress Notes (Signed)
*  PRELIMINARY RESULTS* Echocardiogram 2D Echocardiogram has been performed.  Sherrie Sport 01/04/2017, 9:25 AM

## 2017-01-04 NOTE — Plan of Care (Signed)
Problem: Safety: Goal: Ability to remain free from injury will improve Outcome: Progressing Fall precautions in place, non skid socks when oob  Problem: Physical Regulation: Goal: Will remain free from infection Outcome: Not Progressing Remains on antibiotics  Problem: Tissue Perfusion: Goal: Risk factors for ineffective tissue perfusion will decrease Outcome: Progressing SCD and Pradaxa  Problem: Respiratory: Goal: Respiratory status will improve Outcome: Not Progressing Remains on oxygen

## 2017-01-04 NOTE — Progress Notes (Signed)
Chaplain received an Order Requisition that patient would love to talk to someone about the stress in her life. Patient express that she had a lot going on with her family life. She stated that her and her parent and her siblings were not getting along. Patient has been very sick plus her mother has been very sick. I discussed with patient that she had to get herself well before she can take care of anyone else. Patient and I prayed and I told her I would come back if it was possible.

## 2017-01-05 LAB — HIV ANTIBODY (ROUTINE TESTING W REFLEX): HIV SCREEN 4TH GENERATION: NONREACTIVE

## 2017-01-05 LAB — GLUCOSE, CAPILLARY
GLUCOSE-CAPILLARY: 158 mg/dL — AB (ref 65–99)
Glucose-Capillary: 155 mg/dL — ABNORMAL HIGH (ref 65–99)
Glucose-Capillary: 167 mg/dL — ABNORMAL HIGH (ref 65–99)
Glucose-Capillary: 173 mg/dL — ABNORMAL HIGH (ref 65–99)

## 2017-01-05 MED ORDER — ALUM & MAG HYDROXIDE-SIMETH 200-200-20 MG/5ML PO SUSP
15.0000 mL | Freq: Four times a day (QID) | ORAL | Status: DC | PRN
  Administered 2017-01-05 – 2017-01-06 (×3): 15 mL via ORAL
  Filled 2017-01-05 (×3): qty 30

## 2017-01-05 MED ORDER — BUTALBITAL-APAP-CAFFEINE 50-325-40 MG PO TABS
1.0000 | ORAL_TABLET | Freq: Four times a day (QID) | ORAL | Status: DC | PRN
  Administered 2017-01-05 – 2017-01-06 (×2): 1 via ORAL
  Filled 2017-01-05 (×2): qty 1

## 2017-01-05 NOTE — Progress Notes (Signed)
[place] [facility] [place] NAME: Christine Mcneil    MR#:  [id number]  DATE OF BIRTH:  [birth year]  SUBJECTIVE:  CHIEF COMPLAINT:   Chief Complaint  Patient presents with  . Shortness of Breath   Came with SOB and also had sinus pauses, which is stable now on tele. Pt said, she was prescribed to take 3 tablets of 180 mg of verapamil. Also have pneumonia. HR is stable on tele now. Today feels SOB and congestions in her chest.  REVIEW OF SYSTEMS:  CONSTITUTIONAL: No fever, fatigue or weakness.  EYES: No blurred or double vision.  EARS, NOSE, AND THROAT: No tinnitus or ear pain.  RESPIRATORY: positive for cough, shortness of breath, wheezing ,no  hemoptysis.  CARDIOVASCULAR: No chest pain, orthopnea, edema.  GASTROINTESTINAL: No nausea, vomiting, diarrhea or abdominal pain.  GENITOURINARY: No dysuria, hematuria.  ENDOCRINE: No polyuria, nocturia,  HEMATOLOGY: No anemia, easy bruising or bleeding SKIN: No rash or lesion. MUSCULOSKELETAL: No joint pain or arthritis.   NEUROLOGIC: No tingling, numbness, weakness.  PSYCHIATRY: No anxiety or depression.   ROS  DRUG ALLERGIES:   Allergies  Allergen Reactions  . Amoxicillin Itching  . Keflex [Cephalexin] Itching    VITALS:  Blood pressure 120/81, pulse (!) 101, temperature (!) 97.5 F (36.4 C), temperature source Oral, resp. rate 18, height 5\' 6"  (1.676 m), weight 108.9 kg (240 lb), SpO2 98 %.  PHYSICAL EXAMINATION:  GENERAL:  51 y.o.-year-old patient lying in the bed with no acute distress.  EYES: Pupils equal, round, reactive to light and accommodation. No scleral icterus. Extraocular muscles intact.  HEENT: Head atraumatic, normocephalic. Oropharynx and nasopharynx clear.  NECK:  Supple, no jugular venous distention. No thyroid enlargement, no tenderness.  LUNGS: Normal breath sounds bilaterally, no wheezing, some crepitation. No use of accessory muscles of respiration.  CARDIOVASCULAR: S1, S2  normal. No murmurs, rubs, or gallops.  ABDOMEN: Soft, nontender, nondistended. Bowel sounds present. No organomegaly or mass.  EXTREMITIES: No pedal edema, cyanosis, or clubbing.  NEUROLOGIC: Cranial nerves II through XII are intact. Muscle strength 5/5 in all extremities. Sensation intact. Gait not checked.  PSYCHIATRIC: The patient is alert and oriented x 3.  SKIN: No obvious rash, lesion, or ulcer.   Physical Exam LABORATORY PANEL:   CBC  Recent Labs Lab 01/04/17 0145  WBC 9.0  HGB 13.3  HCT 40.3  PLT 272   ------------------------------------------------------------------------------------------------------------------  Chemistries   Recent Labs Lab 01/04/17 0145  NA 139  K 3.7  CL 104  CO2 25  GLUCOSE 172*  BUN 14  CREATININE 1.21*  CALCIUM 9.2  MG 2.1   ------------------------------------------------------------------------------------------------------------------  Cardiac Enzymes  Recent Labs Lab 01/03/17 2148 01/04/17 0145  TROPONINI <0.03 <0.03   ------------------------------------------------------------------------------------------------------------------  RADIOLOGY:  No results found.  ASSESSMENT AND PLAN:   Active Problems:   Shortness of breath  1. Shortness of breath-suspected to be secondary to pneumonia seen on chest x-ray combined with patient being in complete heart block which has now resolved. - IV Levaquin, follow clinically. Patient is not on oxygen at home.  try to taper and use spirometry.  2. Pneumonia- community-acquired pneumonia.    treat with IV Levaquin 5 days.  -follow blood, sputum cultures. - advised to use incentive spirometry.  3. Complete heart block-patient came into the hospital bradycardic in complete heart block and subsequently converted back to a sinus rhythm. She is presently hemodynamically stable.   on telemetry, cycle her cardiac markers, check her  echocardiogram. - a cardiology consult.  hold  her verapamil.  - She was taking 3 tablets of 180 mg verapamil daily.  4. Hypothyroidism-continue Synthroid.  5. Anxiety/depression-continue Cymbalta, Klonopin, Wellbutrin.  6. Hyperlipidemia-continue atorvastatin.  7. 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 GERD-continue Protonix.  9. Chronic pain syndrome-continue Nucynta.  10. HTN - cont. Quinapril  11. DM Type II w/out complication - cont. Metformin, Tradjenta, Januvia.     All the records are reviewed and case discussed with Care Management/Social Workerr. Management plans discussed with the patient, family and they are in agreement.  CODE STATUS: full.  TOTAL TIME TAKING CARE OF THIS PATIENT: 35 minutes.    POSSIBLE D/C IN 1-2 DAYS, DEPENDING ON CLINICAL CONDITION.   [name] M.D on 01/05/2017   Between 7am to 6pm - Pager - [phone]  After 6pm go to [email or web address] - password [email or web address] [facility]  Office  [phone]  CC: Primary care physician; Zhou-Talbert, Elwyn Lade, MD  Note: This dictation was prepared with Dragon dictation along with smaller phrase technology. Any transcriptional errors that result from this process are unintentional.

## 2017-01-05 NOTE — Plan of Care (Signed)
Problem: Pain Managment: Goal: General experience of comfort will improve Outcome: Progressing Pt with complaint of migraine headache last night, but after getting her home medication ordered for her, pt slept. Will continue to monitor.  Problem: Activity: Goal: Risk for activity intolerance will decrease Outcome: Progressing Up to [facility] standby assist. Tolerating well, will continue to monitor.

## 2017-01-05 NOTE — Care Management (Signed)
Oxygen requirements are acute.  Discussed the need to wean and/ or home oxygen assessment during progression.  Patient does not have a documented chronic cardiopulmonary diagnosis that would meet medicare criteria for oxygen coverage.

## 2017-01-06 LAB — GLUCOSE, CAPILLARY
GLUCOSE-CAPILLARY: 191 mg/dL — AB (ref 65–99)
GLUCOSE-CAPILLARY: 154 mg/dL — AB (ref 65–99)

## 2017-01-06 MED ORDER — LEVOFLOXACIN 750 MG PO TABS
750.0000 mg | ORAL_TABLET | Freq: Every day | ORAL | 0 refills | Status: DC
Start: 2017-01-06 — End: 2017-04-20

## 2017-01-06 MED ORDER — METOPROLOL TARTRATE 25 MG PO TABS
12.5000 mg | ORAL_TABLET | Freq: Two times a day (BID) | ORAL | 1 refills | Status: DC
Start: 2017-01-06 — End: 2017-04-20

## 2017-01-06 NOTE — Progress Notes (Signed)
Discharge instructions explained to pt/ verbalized an understanding/ iv and tele removed/ will transport off unit via wheelchair.  

## 2017-01-06 NOTE — Care Management (Signed)
Discussed mobilization of patient during progression and the need to assess exertional room air sats. Current with PCP at [facility] clinic and no issues obtaining medications or transportation to appointments.  Disability due to fibromyalgia Using incentive spirometry.

## 2017-01-06 NOTE — Plan of Care (Signed)
Problem: Pain Managment: Goal: General experience of comfort will improve Outcome: Progressing Pt given fiorocet for migraine, with much improvement.  Problem: Respiratory: Goal: Respiratory status will improve Outcome: Progressing Pt weaned to room air, satting 96% this morning without wearing O2 all night.

## 2017-01-06 NOTE — Progress Notes (Signed)
Pt called to make aware that her home meds were still here and stored in pharmacy/ instructed pt that she can come pick them up/ verbalized an understanding

## 2017-01-06 NOTE — Discharge Instructions (Signed)

## 2017-01-08 LAB — CULTURE, BLOOD (ROUTINE X 2)
CULTURE: NO GROWTH
Culture: NO GROWTH
Special Requests: ADEQUATE
Special Requests: ADEQUATE

## 2017-01-08 NOTE — Discharge Summary (Signed)
[facility] NAME: Christine Mcneil    MR#:  [id number]  DATE OF BIRTH:  03/01/1966  DATE OF ADMISSION:  01/03/2017   ADMITTING PHYSICIAN: Henreitta Leber, MD  DATE OF DISCHARGE: 01/06/2017  3:14 PM  PRIMARY CARE PHYSICIAN: Zhou-Talbert, Elwyn Lade, MD   ADMISSION DIAGNOSIS:  Complete heart block ([place]) [I44.2] SOB (shortness of breath) [R06.02] Community acquired pneumonia, unspecified laterality [J18.9] DISCHARGE DIAGNOSIS:  Active Problems:   Shortness of breath  SECONDARY DIAGNOSIS:   Past Medical History:  Diagnosis Date  . Anxiety and depression   . Chronic pain   . Diabetes mellitus without complication ([place])   . Hypertension   . Migraines   . Prothrombin gene mutation ([place])   . Thyroid disease    HOSPITAL COURSE:  51 y.o. 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 Shortness of breath-secondary to pneumonia seen on chest x-ray combined with patient being in complete heart block which has now resolved.  2. Pneumonia- community-acquired pneumonia.  - improving with levaquin  3. Complete heart block-patient came into the hospital bradycardic in complete heart block and subsequently converted back to a sinus rhythm. She is presently hemodynamically stable.  - verapamil stopped  4. Hypothyroidism-continue Synthroid.  5. Anxiety/depression-continue Cymbalta, Klonopin, Wellbutrin.  6. Hyperlipidemia-continue atorvastatin.  7. 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 GERD-continue Protonix.  9. Chronic pain syndrome-continue Nucynta.  10. HTN - cont. Quinapril  11. DM Type II w/out complication - cont. Metformin, Tradjenta, Januvia.    DISCHARGE CONDITIONS:  stable CONSULTS OBTAINED:   DRUG ALLERGIES:   Allergies  Allergen Reactions  . Amoxicillin Itching  . Keflex [Cephalexin] Itching   DISCHARGE MEDICATIONS:   Allergies as of 01/06/2017      Reactions   Amoxicillin Itching   Keflex [cephalexin] Itching      Medication List    STOP taking these medications   predniSONE 20 MG tablet Commonly known as:  DELTASONE   verapamil 180 MG 24 hr capsule Commonly known as:  VERELAN PM     TAKE these medications   acetaZOLAMIDE 500 MG capsule Commonly known as:  DIAMOX Take 500 mg by mouth daily.   aerochamber plus with mask inhaler Use as instructed   albuterol 108 (90 Base) MCG/ACT inhaler Commonly known as:  PROVENTIL HFA Inhale 2 puffs into the lungs every 4 (four) hours as needed for wheezing or shortness of breath.   aspirin-acetaminophen-caffeine 250-250-65 MG tablet Commonly known as:  EXCEDRIN MIGRAINE Take 3 tablets by mouth every 8 (eight) hours as needed for headache or migraine.   atorvastatin 40 MG tablet Commonly known as:  LIPITOR Take 40 mg by mouth daily.   bisacodyl 5 MG EC tablet Commonly known as:  DULCOLAX Take 5 mg by mouth daily as needed for moderate constipation.   buPROPion 150 MG 12 hr tablet Commonly known as:  WELLBUTRIN SR Take 300 mg by mouth daily.   clonazePAM 1 MG tablet Commonly known as:  KLONOPIN Take 1 mg by mouth daily as needed.   dabigatran 150 MG Caps capsule Commonly known as:  PRADAXA Take 150 mg by mouth 2 (two) times daily.   DULoxetine 60 MG capsule Commonly known as:  CYMBALTA Take 60 mg by mouth  2 (two) times daily.   esomeprazole 40 MG capsule Commonly known as:  NEXIUM Take 40 mg by mouth daily.   GLUCOSAMINE CHOND MSM FORMULA PO Take 1 tablet by mouth 2 (two) times daily.   guaiFENesin 100 MG/5ML Soln Commonly known as:  ROBITUSSIN Take 5 mLs (100 mg total) by mouth every 4 (four) hours as needed for cough or to loosen  phlegm.   hydrOXYzine 50 MG tablet Commonly known as:  ATARAX/VISTARIL Take 50 mg by mouth 2 (two) times daily.   levofloxacin 750 MG tablet Commonly known as:  LEVAQUIN Take 1 tablet (750 mg total) by mouth daily.   levothyroxine 100 MCG tablet Commonly known as:  SYNTHROID, LEVOTHROID Take 100 mcg by mouth daily.   loratadine 10 MG tablet Commonly known as:  CLARITIN Take 10 mg by mouth daily as needed for allergies.   MAGNESIUM PO Take 500 mg by mouth 2 (two) times daily.   metFORMIN 500 MG tablet Commonly known as:  GLUCOPHAGE Take 500 mg by mouth 2 (two) times daily.   metoprolol tartrate 25 MG tablet Commonly known as:  LOPRESSOR Take 0.5 tablets (12.5 mg total) by mouth 2 (two) times daily.   Milnacipran 50 MG Tabs tablet Commonly known as:  SAVELLA Take 50 mg by mouth 2 (two) times daily.   ondansetron 4 MG disintegrating tablet Commonly known as:  ZOFRAN-ODT Take 4 mg by mouth every 8 (eight) hours as needed for nausea or vomiting.   promethazine 25 MG tablet Commonly known as:  PHENERGAN Take 25 mg by mouth every 8 (eight) hours as needed for nausea or vomiting.   quinapril 10 MG tablet Commonly known as:  ACCUPRIL Take 10 mg by mouth daily.   ranitidine 150 MG tablet Commonly known as:  ZANTAC Take 150 mg by mouth 2 (two) times daily.   rizatriptan 10 MG tablet Commonly known as:  MAXALT Take 1 tab at migraine onset. May repeat in 2 hours prn.  Limit to 2 in 24 hours and to 2 days weekly.   sitaGLIPtin 100 MG tablet Commonly known as:  JANUVIA Take 100 mg by mouth daily.   tapentadol 100 MG 12 hr tablet Commonly known as:  NUCYNTA Take 100 mg by mouth 2 (two) times daily.   Vitamin D3 1000 units Caps Take 1 capsule by mouth daily.   zolpidem 10 MG tablet Commonly known as:  AMBIEN Take 10 mg by mouth daily.        DISCHARGE INSTRUCTIONS:   DIET:  Regular diet DISCHARGE CONDITION:  Good ACTIVITY:  Activity as tolerated OXYGEN:   Home Oxygen: No.  Oxygen Delivery: room air DISCHARGE LOCATION:  home   If you experience worsening of your admission symptoms, develop shortness of breath, life threatening emergency, suicidal or homicidal thoughts you must seek medical attention immediately by calling 911 or calling your MD immediately  if symptoms less severe.  You Must read complete instructions/literature along with all the possible adverse reactions/side effects for all the Medicines you take and that have been prescribed to you. Take any new Medicines after you have completely understood and accpet all the possible adverse reactions/side effects.   Please note  You were cared for by a hospitalist during your hospital stay. If you have any questions about your discharge medications or the care you received while you were in the hospital after you are discharged, you can call the unit and asked to speak with the hospitalist on call if the hospitalist  that took care of you is not available. Once you are discharged, your primary care physician will handle any further medical issues. Please note that NO REFILLS for any discharge medications will be authorized once you are discharged, as it is imperative that you return to your primary care physician (or establish a relationship with a primary care physician if you do not have one) for your aftercare needs so that they can reassess your need for medications and monitor your lab values.    On the day of Discharge:  VITAL SIGNS:  Blood pressure 121/81, pulse (!) 107, temperature 98.1 F (36.7 C), temperature source Oral, resp. rate 18, height 5\' 6"  (1.676 m), weight 108.9 kg (240 lb), SpO2 97 %. PHYSICAL EXAMINATION:  GENERAL:  51 y.o.-year-old patient lying in the bed with no acute distress.  EYES: Pupils equal, round, reactive to light and accommodation. No scleral icterus. Extraocular muscles intact.  HEENT: Head atraumatic, normocephalic. Oropharynx and nasopharynx clear.   NECK:  Supple, no jugular venous distention. No thyroid enlargement, no tenderness.  LUNGS: Normal breath sounds bilaterally, no wheezing, rales,rhonchi or crepitation. No use of accessory muscles of respiration.  CARDIOVASCULAR: S1, S2 normal. No murmurs, rubs, or gallops.  ABDOMEN: Soft, non-tender, non-distended. Bowel sounds present. No organomegaly or mass.  EXTREMITIES: No pedal edema, cyanosis, or clubbing.  NEUROLOGIC: Cranial nerves II through XII are intact. Muscle strength 5/5 in all extremities. Sensation intact. Gait not checked.  PSYCHIATRIC: The patient is alert and oriented x 3.  SKIN: No obvious rash, lesion, or ulcer.  DATA REVIEW:   CBC  Recent Labs Lab 01/04/17 0145  WBC 9.0  HGB 13.3  HCT 40.3  PLT 272    Chemistries   Recent Labs Lab 01/04/17 0145  NA 139  K 3.7  CL 104  CO2 25  GLUCOSE 172*  BUN 14  CREATININE 1.21*  CALCIUM 9.2  MG 2.1     Follow-up Information    Zhou-Talbert, Elwyn Lade, MD. Schedule an appointment as soon as possible for a visit in 1 week.   Specialty:  Family Medicine Why:  PLEASE CALL THE OFFICE TO SCHEDULE THIS APPOINTMENT. THANKS! Contact information: [place] [address] [phone]        Yolonda Kida, MD. Go on 01/20/2017.   Specialties:  Cardiology, Internal Medicine Why:  Appointment Time: 10:00AM Contact information: [place] [address] [phone]           Management plans discussed with the patient, family and they are in agreement.  CODE STATUS: Prior   TOTAL TIME TAKING CARE OF THIS PATIENT: 45 minutes.    Max Sane M.D on 01/08/2017 at 5:09 PM  Between 7am to 6pm - Pager - [phone]  After 6pm go to [email or web address] - Proofreader  [facility]  Office  [phone]  CC: Primary care physician; Zhou-Talbert, Elwyn Lade, MD   Note: This dictation was prepared with  Dragon dictation along with smaller phrase technology. Any transcriptional errors that result from this process are unintentional.

## 2017-01-20 LAB — ECHOCARDIOGRAM COMPLETE
Height: 66 in
Weight: 3840 oz

## 2017-04-20 ENCOUNTER — Encounter: Payer: Self-pay | Admitting: Emergency Medicine

## 2017-04-20 ENCOUNTER — Emergency Department
Admission: EM | Admit: 2017-04-20 | Discharge: 2017-04-20 | Disposition: A | Discharge status: Home / Self Care | Payer: Medicare Other | Attending: Emergency Medicine | Admitting: Emergency Medicine

## 2017-04-20 ENCOUNTER — Other Ambulatory Visit: Payer: Self-pay

## 2017-04-20 DIAGNOSIS — Z7984 Long term (current) use of oral hypoglycemic drugs: Secondary | ICD-10-CM | POA: Diagnosis not present

## 2017-04-20 DIAGNOSIS — E119 Type 2 diabetes mellitus without complications: Secondary | ICD-10-CM | POA: Insufficient documentation

## 2017-04-20 DIAGNOSIS — Z79899 Other long term (current) drug therapy: Secondary | ICD-10-CM | POA: Diagnosis not present

## 2017-04-20 DIAGNOSIS — R109 Unspecified abdominal pain: Secondary | ICD-10-CM

## 2017-04-20 DIAGNOSIS — I10 Essential (primary) hypertension: Secondary | ICD-10-CM | POA: Insufficient documentation

## 2017-04-20 DIAGNOSIS — R1031 Right lower quadrant pain: Secondary | ICD-10-CM | POA: Diagnosis present

## 2017-04-20 LAB — URINALYSIS, COMPLETE (UACMP) WITH MICROSCOPIC
BILIRUBIN URINE: NEGATIVE
Bacteria, UA: NONE SEEN
Glucose, UA: NEGATIVE mg/dL
HGB URINE DIPSTICK: NEGATIVE
Ketones, ur: NEGATIVE mg/dL
Leukocytes, UA: NEGATIVE
NITRITE: NEGATIVE
PH: 7 (ref 5.0–8.0)
Protein, ur: 30 mg/dL — AB
RBC / HPF: NONE SEEN RBC/hpf (ref 0–5)
SPECIFIC GRAVITY, URINE: 1.015 (ref 1.005–1.030)

## 2017-04-20 MED ORDER — TRAMADOL HCL 50 MG PO TABS: 50.0000 mg | ORAL_TABLET | Freq: Four times a day (QID) | ORAL | 0 refills | Status: DC | PRN

## 2017-04-20 MED ORDER — KETOROLAC TROMETHAMINE 10 MG PO TABS: 10.0000 mg | ORAL_TABLET | Freq: Four times a day (QID) | ORAL | 0 refills | Status: DC | PRN

## 2017-04-20 MED ORDER — KETOROLAC TROMETHAMINE 30 MG/ML IJ SOLN
30.0000 mg | Freq: Once | INTRAMUSCULAR | Status: AC
  Administered 2017-04-20: 30 mg via INTRAMUSCULAR
  Filled 2017-04-20: qty 1

## 2017-04-20 NOTE — ED Provider Notes (Signed)
[facility] Emergency Department Provider Note  ____________________________________________   First MD Initiated Contact with Patient 04/20/17 1221     (approximate)  I have reviewed the triage vital signs and the nursing notes.   HISTORY  Chief Complaint Back Pain    HPI Christine Mcneil is a 51 y.o. female complains of right sided flank pain, states it comes in spasms, symptoms for 1 week, denies injury, denies UTI symptoms, denies fever or chills, patient is on a long list of medications, she takes Toradol at home for pain as needed   Past Medical History:  Diagnosis Date  . Anxiety and depression   . Chronic pain   . Diabetes mellitus without complication ([place])   . Hypertension   . Migraines   . 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 Thyroid disease     Patient Active Problem List   Diagnosis Date Noted  . Shortness of breath 01/03/2017    Past Surgical History:  Procedure Laterality Date  . ABDOMINAL HYSTERECTOMY    . CHOLECYSTECTOMY    . ROTATOR CUFF REPAIR Right     Prior to Admission medications   Medication Sig Start Date End Date Taking? Authorizing Provider  acetaZOLAMIDE (DIAMOX) 500 MG capsule Take 500 mg by mouth daily. 11/18/16   [provider]  albuterol (PROVENTIL HFA) 108 (90 Base) MCG/ACT inhaler Inhale 2 puffs into the lungs every 4 (four) hours as needed for wheezing or shortness of breath. 12/12/16   Carrie Mew, MD  aspirin-acetaminophen-caffeine (EXCEDRIN MIGRAINE) [id number] MG tablet Take 3 tablets by mouth every 8 (eight) hours as needed for headache or migraine.    [provider]  atorvastatin (LIPITOR) 40 MG tablet Take 40 mg by mouth daily. 07/11/15   [provider]  buPROPion (WELLBUTRIN SR) 150 MG 12 hr tablet Take 300 mg by mouth daily. 10/30/15   [provider]  Cholecalciferol (VITAMIN D3) 1000 units CAPS Take 1 capsule by mouth daily.    [provider]    clonazePAM (KLONOPIN) 1 MG tablet Take 1 mg by mouth daily as needed. 12/07/16   [provider]  dabigatran (PRADAXA) 150 MG CAPS capsule Take 150 mg by mouth 2 (two) times daily. 10/12/16 10/12/17  [provider]  DULoxetine (CYMBALTA) 60 MG capsule Take 60 mg by mouth 2 (two) times daily. 03/15/11   [provider]  esomeprazole (NEXIUM) 40 MG capsule Take 40 mg by mouth daily.    [provider]  hydrOXYzine (ATARAX/VISTARIL) 50 MG tablet Take 50 mg by mouth 2 (two) times daily. 11/06/15   [provider]  ketorolac (TORADOL) 10 MG tablet Take 1 tablet (10 mg total) by mouth every 6 (six) hours as needed. 04/20/17   Fisher, Linden Dolin, PA-C  levothyroxine (SYNTHROID, LEVOTHROID) 100 MCG tablet Take 100 mcg by mouth daily. 08/18/15   [provider]  loratadine (CLARITIN) 10 MG tablet Take 10 mg by mouth daily as needed for allergies.    [provider]  MAGNESIUM PO Take 500 mg by mouth 2 (two) times daily.    [provider]  metFORMIN (GLUCOPHAGE) 500 MG tablet Take 500 mg by mouth 2 (two) times daily.    [provider]  Milnacipran (SAVELLA) 50 MG TABS tablet Take 50 mg by mouth 2 (two) times daily.  10/08/16 10/08/17  [provider]  Misc Natural Products ([place] MSM FORMULA PO) Take 1 tablet by mouth 2 (two) times daily.  [provider]  ondansetron (ZOFRAN-ODT) 4 MG disintegrating tablet Take 4 mg by mouth every 8 (eight) hours as needed for nausea or vomiting.    [provider]  promethazine (PHENERGAN) 25 MG tablet Take 25 mg by mouth every 8 (eight) hours as needed for nausea or vomiting.    [provider]  quinapril (ACCUPRIL) 10 MG tablet Take 10 mg by mouth daily. 05/01/13   [provider]  ranitidine (ZANTAC) 150 MG tablet Take 150 mg by mouth 2 (two) times daily. 01/20/15   [provider]  rizatriptan (MAXALT) 10 MG tablet Take 1 tab at  migraine onset. May repeat in 2 hours prn.  Limit to 2 in 24 hours and to 2 days weekly. 09/09/15   [provider]  sitaGLIPtin (JANUVIA) 100 MG tablet Take 100 mg by mouth daily.    [provider]  Spacer/Aero-Holding Chambers (AEROCHAMBER PLUS WITH MASK) inhaler Use as instructed 12/12/16   Carrie Mew, MD  traMADol (ULTRAM) 50 MG tablet Take 1 tablet (50 mg total) by mouth every 6 (six) hours as needed. 04/20/17 04/20/18  Caryn Section Linden Dolin, PA-C  zolpidem (AMBIEN) 10 MG tablet Take 10 mg by mouth daily. 12/16/16 01/15/17  [provider]    Allergies Amoxicillin and Keflex [cephalexin]  Family History  Problem Relation Age of Onset  . Diabetes Mother   . Hyperlipidemia Mother   . Hypertension Mother   . COPD Mother   . Diabetes Father   . Hyperlipidemia Father   . Hypertension Father   . Thyroid disease Father     Social History Social History   Tobacco Use  . Smoking status: Never Smoker  . Smokeless tobacco: Never Used  Substance Use Topics  . Alcohol use: No  . Drug use: No    Review of Systems  Constitutional: No fever/chills Eyes: No visual changes. ENT: No sore throat. Respiratory: Denies cough ABD: Positive right sided flank pain Genitourinary: Negative for dysuria. Musculoskeletal: Negative for back pain. Skin: Negative for rash.    ____________________________________________   PHYSICAL EXAM:  VITAL SIGNS: ED Triage Vitals  Enc Vitals Group     BP 04/20/17 1133 (!) 151/92     Pulse Rate 04/20/17 1133 (!) 114     Resp 04/20/17 1133 20     Temp 04/20/17 1133 97.7 F (36.5 C)     Temp Source 04/20/17 1133 Oral     SpO2 04/20/17 1133 99 %     Weight 04/20/17 1132 242 lb (109.8 kg)     Height 04/20/17 1132 5\' 6"  (1.676 m)     Head Circumference --      Peak Flow --      Pain Score 04/20/17 1131 10     Pain Loc --      Pain Edu? --      Excl. in [place]? --     Constitutional: Alert and oriented. Well appearing and  in no acute distress. Eyes: Conjunctivae are normal.  Head: Atraumatic. Nose: No congestion/rhinnorhea. Mouth/Throat: Mucous membranes are moist.   Cardiovascular: Normal rate, regular rhythm. Respiratory: Normal respiratory effort.  No retractions ABD soft nontender bowel sounds normal no CVA tenderness noted GU: deferred Musculoskeletal: FROM all extremities, warm and well perfused, ribs are nontender, lumbar spine is nontender, muscles are not spasmed Neurologic:  Normal speech and language.  Skin:  Skin is warm, dry and intact. No rash noted. Psychiatric: Mood and affect are normal. Speech and behavior are normal.  ____________________________________________   LABS (all labs ordered are listed, but only abnormal results are displayed)  Labs Reviewed  URINALYSIS, COMPLETE (UACMP) WITH MICROSCOPIC - Abnormal; Notable for the following components:      Result Value   Color, Urine AMBER (*)    APPearance CLOUDY (*)    Protein, ur 30 (*)    Squamous Epithelial / LPF 0-5 (*)    All other components within normal limits   ____________________________________________   ____________________________________________  RADIOLOGY    ____________________________________________   PROCEDURES  Procedure(s) performed: No      ____________________________________________   INITIAL IMPRESSION / ASSESSMENT AND PLAN / ED COURSE  Pertinent labs & imaging results that were available during my care of the patient were reviewed by me and considered in my medical decision making (see chart for details).  Patient is 51 year old female that appears well, she is in no distress, she jumps up occasionally saying that the right flank area is hurting, physical exam is normal, urinalysis is negative, Toradol 30 mg IM was given while patient was in the ED, she states it did help a little, prescription for tramadol 50 mg #20 with no refill was given, and she was instructed to use both very  sparingly, she is to follow-up with her regular doctor if she is not better in 3-5 days, she is to return to the emergency room if she is worse, patient states she understands, she left in stable condition and walked out without distress      ____________________________________________   FINAL CLINICAL IMPRESSION(S) / ED DIAGNOSES  Final diagnoses:  Flank pain      NEW MEDICATIONS STARTED DURING THIS VISIT:  This SmartLink is deprecated. Use AVSMEDLIST instead to display the medication list for a patient.   Note:  This document was prepared using Dragon voice recognition software and may include unintentional dictation errors.    Versie Starks, PA-C 04/20/17 1335    Arta Silence, MD 04/20/17 [phone]

## 2017-04-20 NOTE — ED Notes (Signed)
Pt sitting up on side of bed, appears in no distress.

## 2017-04-20 NOTE — Discharge Instructions (Signed)
And follow-up with your regular doctor if you're not better in 3-5 days, use medication as prescribed, I preferred to use Tylenol instead of Toradol as needed for pain, but if the Tylenol does not help then use the Toradol sparingly, if you become worse please return to the emergency department or your regular doctor

## 2017-04-20 NOTE — ED Triage Notes (Signed)
Arrives c/o right lower back pain.  Denies injury.  Pain x 1 week.

## 2017-05-27 DIAGNOSIS — G459 Transient cerebral ischemic attack, unspecified: Secondary | ICD-10-CM

## 2017-05-27 HISTORY — DX: Transient cerebral ischemic attack, unspecified: G45.9

## 2017-05-30 DIAGNOSIS — G4761 Periodic limb movement disorder: Secondary | ICD-10-CM | POA: Insufficient documentation

## 2017-06-05 ENCOUNTER — Emergency Department: Payer: Medicare Other

## 2017-06-05 ENCOUNTER — Emergency Department
Admission: EM | Admit: 2017-06-05 | Discharge: 2017-06-05 | Disposition: A | Discharge status: Home / Self Care | Payer: Medicare Other | Attending: Emergency Medicine | Admitting: Emergency Medicine

## 2017-06-05 ENCOUNTER — Other Ambulatory Visit: Payer: Self-pay

## 2017-06-05 DIAGNOSIS — E119 Type 2 diabetes mellitus without complications: Secondary | ICD-10-CM | POA: Insufficient documentation

## 2017-06-05 DIAGNOSIS — Z7901 Long term (current) use of anticoagulants: Secondary | ICD-10-CM | POA: Diagnosis not present

## 2017-06-05 DIAGNOSIS — R55 Syncope and collapse: Secondary | ICD-10-CM | POA: Diagnosis not present

## 2017-06-05 DIAGNOSIS — Z79899 Other long term (current) drug therapy: Secondary | ICD-10-CM | POA: Diagnosis not present

## 2017-06-05 DIAGNOSIS — R42 Dizziness and giddiness: Secondary | ICD-10-CM | POA: Diagnosis present

## 2017-06-05 DIAGNOSIS — I10 Essential (primary) hypertension: Secondary | ICD-10-CM | POA: Diagnosis not present

## 2017-06-05 DIAGNOSIS — Z7984 Long term (current) use of oral hypoglycemic drugs: Secondary | ICD-10-CM | POA: Insufficient documentation

## 2017-06-05 DIAGNOSIS — R110 Nausea: Secondary | ICD-10-CM | POA: Diagnosis not present

## 2017-06-05 HISTORY — DX: Transient cerebral ischemic attack, unspecified: G45.9

## 2017-06-05 LAB — BASIC METABOLIC PANEL
Anion gap: 12 (ref 5–15)
BUN: 19 mg/dL (ref 6–20)
CHLORIDE: 103 mmol/L (ref 101–111)
CO2: 23 mmol/L (ref 22–32)
Calcium: 9.7 mg/dL (ref 8.9–10.3)
Creatinine, Ser: 0.88 mg/dL (ref 0.44–1.00)
GFR calc Af Amer: >60 mL/min (ref >60)
GFR calc non Af Amer: >60 mL/min (ref >60)
GLUCOSE: 238 mg/dL — AB (ref 65–99)
POTASSIUM: 4.1 mmol/L (ref 3.5–5.1)
Sodium: 138 mmol/L (ref 135–145)

## 2017-06-05 LAB — URINALYSIS, COMPLETE (UACMP) WITH MICROSCOPIC
BILIRUBIN URINE: NEGATIVE
Glucose, UA: NEGATIVE mg/dL
Hgb urine dipstick: NEGATIVE
KETONES UR: NEGATIVE mg/dL
Nitrite: NEGATIVE
PROTEIN: NEGATIVE mg/dL
Specific Gravity, Urine: 1.004 — ABNORMAL LOW (ref 1.005–1.030)
pH: 5 (ref 5.0–8.0)

## 2017-06-05 LAB — CBC WITH DIFFERENTIAL/PLATELET
Basophils Absolute: 0.1 10*3/uL (ref 0–0.1)
Basophils Relative: 1 %
EOS PCT: 1 %
Eosinophils Absolute: 0.1 10*3/uL (ref 0–0.7)
HEMATOCRIT: 35.6 % (ref 35.0–47.0)
Hemoglobin: 11.2 g/dL — ABNORMAL LOW (ref 12.0–16.0)
LYMPHS ABS: 3.1 10*3/uL (ref 1.0–3.6)
LYMPHS PCT: 23 %
MCH: 23.7 pg — AB (ref 26.0–34.0)
MCHC: 31.6 g/dL — ABNORMAL LOW (ref 32.0–36.0)
MCV: 74.8 fL — AB (ref 80.0–100.0)
Monocytes Absolute: 1.1 10*3/uL — ABNORMAL HIGH (ref 0.2–0.9)
Monocytes Relative: 8 %
NEUTROS PCT: 67 %
Neutro Abs: 9 10*3/uL — ABNORMAL HIGH (ref 1.4–6.5)
Platelets: 338 10*3/uL (ref 150–440)
RBC: 4.75 MIL/uL (ref 3.80–5.20)
RDW: 15.7 % — ABNORMAL HIGH (ref 11.5–14.5)
WBC: 13.4 10*3/uL — AB (ref 3.6–11.0)

## 2017-06-05 LAB — TROPONIN I: Troponin I: <0.03 ng/mL (ref <0.03)

## 2017-06-05 MED ORDER — SODIUM CHLORIDE 0.9 % IV BOLUS (SEPSIS)
1000.0000 mL | Freq: Once | INTRAVENOUS | Status: AC
  Administered 2017-06-05: 1000 mL via INTRAVENOUS

## 2017-06-05 MED ORDER — ONDANSETRON HCL 4 MG/2ML IJ SOLN
4.0000 mg | Freq: Once | INTRAMUSCULAR | Status: AC
  Administered 2017-06-05: 4 mg via INTRAVENOUS
  Filled 2017-06-05: qty 2

## 2017-06-05 MED ORDER — METOCLOPRAMIDE HCL 5 MG/ML IJ SOLN
10.0000 mg | Freq: Once | INTRAMUSCULAR | Status: AC
Start: 2017-06-05 — End: 2017-06-05
  Administered 2017-06-05: 10 mg via INTRAVENOUS

## 2017-06-05 MED ORDER — ACETAMINOPHEN 500 MG PO TABS
1000.0000 mg | ORAL_TABLET | Freq: Once | ORAL | Status: AC
  Administered 2017-06-05: 1000 mg via ORAL
  Filled 2017-06-05: qty 2

## 2017-06-05 MED ORDER — METOCLOPRAMIDE HCL 5 MG/ML IJ SOLN
INTRAMUSCULAR | Status: AC
  Filled 2017-06-05: qty 2

## 2017-06-05 MED ORDER — METOCLOPRAMIDE HCL 10 MG PO TABS: 10.0000 mg | ORAL_TABLET | Freq: Three times a day (TID) | ORAL | 0 refills | Status: DC | PRN

## 2017-06-05 NOTE — ED Triage Notes (Addendum)
Pt comes via [facility] with c/o nausea that just started today. Pt denies vomiting, VS stable per EMS and NS EKG. Respirations even and unlabored at this time. Pt also states headache that started this morning

## 2017-06-05 NOTE — ED Provider Notes (Signed)
[facility] Emergency Department Provider Note  ____________________________________________  Time seen: Approximately 3:24 PM  I have reviewed the triage vital signs and the nursing notes.   HISTORY  Chief Complaint Nausea and Headache   HPI Christine IPOCK is a 52 y.o. female with a history of chronic migraine headaches, pseudotumor cerebri, TIA,hypertension, diabetes who presents for evaluation of a near syncopal episode. Patient reports that she took her propranolol earlier today for her heart rate. As soon as she finished taking the medication she started feeling clammy and felt like a warm sensation was coming down her body from her head to her feet. She felt dizzy like she was going to pass out, nauseous, and developed what she describes as a full sensation in her head. She reports that her symptoms have been constant since prior to arrival however they are improving at this time. She denies a headache, slurred speech, facial droop, unilateral weakness or numbness, difficulty finding words, chest pain or shortness of breath, syncope, or any recent illness. She had normal PO intake today.  Past Medical History:  Diagnosis Date  . Anxiety and depression   . Chronic pain   . Diabetes mellitus without complication ([place])   . Hypertension   . Migraines   . Prothrombin gene mutation ([place])   . Thyroid disease   . TIA (transient ischemic attack) 05/27/2017    Patient Active Problem List   Diagnosis Date Noted  . Shortness of breath 01/03/2017    Past Surgical History:  Procedure Laterality Date  . ABDOMINAL HYSTERECTOMY    . CHOLECYSTECTOMY    . ROTATOR CUFF REPAIR Right     Prior to Admission medications   Medication Sig Start Date End Date Taking? Authorizing Provider  acetaZOLAMIDE (DIAMOX) 500 MG capsule Take 500 mg by mouth daily. 11/18/16   [provider]  albuterol (PROVENTIL HFA) 108 (90 Base) MCG/ACT inhaler Inhale 2 puffs into the  lungs every 4 (four) hours as needed for wheezing or shortness of breath. 12/12/16   Carrie Mew, MD  aspirin-acetaminophen-caffeine (EXCEDRIN MIGRAINE) [id number] MG tablet Take 3 tablets by mouth every 8 (eight) hours as needed for headache or migraine.    [provider]  atorvastatin (LIPITOR) 40 MG tablet Take 40 mg by mouth daily. 07/11/15   [provider]  buPROPion (WELLBUTRIN SR) 150 MG 12 hr tablet Take 300 mg by mouth daily. 10/30/15   [provider]  Cholecalciferol (VITAMIN D3) 1000 units CAPS Take 1 capsule by mouth daily.    [provider]  clonazePAM (KLONOPIN) 1 MG tablet Take 1 mg by mouth daily as needed. 12/07/16   [provider]  dabigatran (PRADAXA) 150 MG CAPS capsule Take 150 mg by mouth 2 (two) times daily. 10/12/16 10/12/17  [provider]  DULoxetine (CYMBALTA) 60 MG capsule Take 60 mg by mouth 2 (two) times daily. 03/15/11   [provider]  esomeprazole (NEXIUM) 40 MG capsule Take 40 mg by mouth daily.    [provider]  hydrOXYzine (ATARAX/VISTARIL) 50 MG tablet Take 50 mg by mouth 2 (two) times daily. 11/06/15   [provider]  ketorolac (TORADOL) 10 MG tablet Take 1 tablet (10 mg total) by mouth every 6 (six) hours as needed. 04/20/17   Fisher, Linden Dolin, PA-C  levothyroxine (SYNTHROID, LEVOTHROID) 100 MCG tablet Take 100 mcg by mouth daily. 08/18/15   [provider]  loratadine (CLARITIN) 10 MG tablet Take 10 mg by mouth daily  as needed for allergies.    [provider]  MAGNESIUM PO Take 500 mg by mouth 2 (two) times daily.    [provider]  metFORMIN (GLUCOPHAGE) 500 MG tablet Take 500 mg by mouth 2 (two) times daily.    [provider]  metoCLOPramide (REGLAN) 10 MG tablet Take 1 tablet (10 mg total) by mouth every 8 (eight) hours as needed for up to 3 days for nausea. 06/05/17 06/08/17  Rudene Re, MD  Milnacipran (SAVELLA) 50 MG TABS  tablet Take 50 mg by mouth 2 (two) times daily.  10/08/16 10/08/17  [provider]  Misc Natural Products ([place] MSM FORMULA PO) Take 1 tablet by mouth 2 (two) times daily.    [provider]  ondansetron (ZOFRAN-ODT) 4 MG disintegrating tablet Take 4 mg by mouth every 8 (eight) hours as needed for nausea or vomiting.    [provider]  promethazine (PHENERGAN) 25 MG tablet Take 25 mg by mouth every 8 (eight) hours as needed for nausea or vomiting.    [provider]  quinapril (ACCUPRIL) 10 MG tablet Take 10 mg by mouth daily. 05/01/13   [provider]  ranitidine (ZANTAC) 150 MG tablet Take 150 mg by mouth 2 (two) times daily. 01/20/15   [provider]  rizatriptan (MAXALT) 10 MG tablet Take 1 tab at migraine onset. May repeat in 2 hours prn.  Limit to 2 in 24 hours and to 2 days weekly. 09/09/15   [provider]  sitaGLIPtin (JANUVIA) 100 MG tablet Take 100 mg by mouth daily.    [provider]  Spacer/Aero-Holding Chambers (AEROCHAMBER PLUS WITH MASK) inhaler Use as instructed 12/12/16   Carrie Mew, MD  traMADol (ULTRAM) 50 MG tablet Take 1 tablet (50 mg total) by mouth every 6 (six) hours as needed. 04/20/17 04/20/18  Caryn Section Linden Dolin, PA-C  zolpidem (AMBIEN) 10 MG tablet Take 10 mg by mouth daily. 12/16/16 01/15/17  [provider]    Allergies Amoxicillin and Keflex [cephalexin]  Family History  Problem Relation Age of Onset  . Diabetes Mother   . Hyperlipidemia Mother   . Hypertension Mother   . COPD Mother   . Diabetes Father   . Hyperlipidemia Father   . Hypertension Father   . Thyroid disease Father     Social History Social History   Tobacco Use  . Smoking status: Never Smoker  . Smokeless tobacco: Never Used  Substance Use Topics  . Alcohol use: No  . Drug use: No    Review of Systems  Constitutional: Negative for fever. + lightheadedness, clammy Eyes: Negative for  visual changes. ENT: Negative for sore throat. Neck: No neck pain  Cardiovascular: Negative for chest pain. Respiratory: Negative for shortness of breath. Gastrointestinal: Negative for abdominal pain, vomiting or diarrhea. + nausea Genitourinary: Negative for dysuria. Musculoskeletal: Negative for back pain. Skin: Negative for rash. Neurological: Negative for weakness or numbness. + HA Psych: No SI or HI  ____________________________________________   PHYSICAL EXAM:  VITAL SIGNS: ED Triage Vitals  Enc Vitals Group     BP 06/05/17 1510 (!) 141/90     Pulse Rate 06/05/17 1510 86     Resp 06/05/17 1510 19     Temp 06/05/17 1510 98.6 F (37 C)     Temp Source 06/05/17 1510 Oral     SpO2 06/05/17 1510 100 %     Weight 06/05/17 1511 242 lb (109.8 kg)     Height 06/05/17  1511 5\' 6"  (1.676 m)     Head Circumference --      Peak Flow --      Pain Score 06/05/17 1510 6     Pain Loc --      Pain Edu? --      Excl. in [place]? --     Constitutional: Alert and oriented. Well appearing and in no apparent distress. HEENT:      Head: Normocephalic and atraumatic.         Eyes: Conjunctivae are normal. Sclera is non-icteric. normal funduscopy exam      Mouth/Throat: Mucous membranes are moist.       Neck: Supple with no signs of meningismus.no bruit Cardiovascular: Regular rate and rhythm. No murmurs, gallops, or rubs. 2+ symmetrical distal pulses are present in all extremities. No JVD. Respiratory: Normal respiratory effort. Lungs are clear to auscultation bilaterally. No wheezes, crackles, or rhonchi.  Gastrointestinal: Soft, non tender, and non distended with positive bowel sounds. No rebound or guarding. Musculoskeletal: Nontender with normal range of motion in all extremities. No edema, cyanosis, or erythema of extremities. Neurologic: Normal speech and language. A & O x3, PERRL, EOMI, no nystagmus, CN II-XII intact, motor testing reveals good tone and bulk throughout. There is no  evidence of pronator drift or dysmetria. Muscle strength is 5/5 throughout. Deep tendon reflexes are 2+ throughout with downgoing toes. Sensory examination is intact. Gait is normal. Skin: Skin is warm, dry and intact. No rash noted. Psychiatric: Mood and affect are normal. Speech and behavior are normal.  ____________________________________________   LABS (all labs ordered are listed, but only abnormal results are displayed)  Labs Reviewed  CBC WITH DIFFERENTIAL/PLATELET - Abnormal; Notable for the following components:      Result Value   WBC 13.4 (*)    Hemoglobin 11.2 (*)    MCV 74.8 (*)    MCH 23.7 (*)    MCHC 31.6 (*)    RDW 15.7 (*)    Neutro Abs 9.0 (*)    Monocytes Absolute 1.1 (*)    All other components within normal limits  BASIC METABOLIC PANEL - Abnormal; Notable for the following components:   Glucose, Bld 238 (*)    All other components within normal limits  URINALYSIS, COMPLETE (UACMP) WITH MICROSCOPIC - Abnormal; Notable for the following components:   Color, Urine YELLOW (*)    APPearance HAZY (*)    Specific Gravity, Urine 1.004 (*)    Leukocytes, UA TRACE (*)    Bacteria, UA RARE (*)    Squamous Epithelial / LPF 0-5 (*)    All other components within normal limits  URINE CULTURE  TROPONIN I   ____________________________________________  EKG  ED ECG REPORT I, Rudene Re, the attending physician, personally viewed and interpreted this ECG.  Normal sinus rhythm, rate of 83, normal intervals, normal axis, no ST elevations or depressions.  ____________________________________________  RADIOLOGY  Head CT: negative  ____________________________________________   PROCEDURES  Procedure(s) performed: None Procedures Critical Care performed:  None ____________________________________________   INITIAL IMPRESSION / ASSESSMENT AND PLAN / ED COURSE  52 y.o. female with a history of chronic migraine headaches, pseudotumor cerebri,  TIA,hypertension, diabetes who presents for evaluation of a near syncopal episode preceded by a warm sensation going down her body, dizziness, nausea, and clammy. patient complaining of a fullness sensation in her head but no headache. She is neurologically intact with normal funduscopic exam. We'll do an EKG to rule out dysrhythmia or ischemia, labs to  rule out dehydration, anemia, electrolyte abnormalities. We'll monitor on telemetry for any signs of arrhythmia. We'll do a head CT.    _________________________ 7:12 PM on 06/05/2017 -----------------------------------------  UA with no evidence of infection or dehydration, patient with leukocytosis of unknown etiology possible viral illness causing her nausea. White count is 13.4. BMP showing hyperglycemia with no evidence of DKA, normal creatinine. head CT negative for acute findings. Patient feels improved after fluids, Zofran and Reglan. We'll discharge home with close follow-up with primary care doctor, return precautions were discussed, patient provided with a prescription for Reglan.   As part of my medical decision making, I reviewed the following data within the [place] notes reviewed and incorporated, Labs reviewed , EKG interpreted , Old chart reviewed, Radiograph reviewed , Notes from prior ED visits and [place] Controlled Substance Database    Pertinent labs & imaging results that were available during my care of the patient were reviewed by me and considered in my medical decision making (see chart for details).    ____________________________________________   FINAL CLINICAL IMPRESSION(S) / ED DIAGNOSES  Final diagnoses:  Near syncope  Nausea      NEW MEDICATIONS STARTED DURING THIS VISIT:  ED Discharge Orders        Ordered    metoCLOPramide (REGLAN) 10 MG tablet  Every 8 hours PRN     06/05/17 1912       Note:  This document was prepared using Dragon voice recognition software and may  include unintentional dictation errors.    Rudene Re, MD 06/05/17 [phone]

## 2017-06-07 LAB — URINE CULTURE

## 2017-06-08 ENCOUNTER — Emergency Department: Payer: Medicare Other

## 2017-06-08 ENCOUNTER — Emergency Department
Admission: EM | Admit: 2017-06-08 | Discharge: 2017-06-08 | Disposition: A | Discharge status: Home / Self Care | Payer: Medicare Other | Attending: Emergency Medicine | Admitting: Emergency Medicine

## 2017-06-08 DIAGNOSIS — Z7984 Long term (current) use of oral hypoglycemic drugs: Secondary | ICD-10-CM | POA: Diagnosis not present

## 2017-06-08 DIAGNOSIS — Z79899 Other long term (current) drug therapy: Secondary | ICD-10-CM | POA: Insufficient documentation

## 2017-06-08 DIAGNOSIS — R1084 Generalized abdominal pain: Secondary | ICD-10-CM | POA: Diagnosis not present

## 2017-06-08 DIAGNOSIS — E119 Type 2 diabetes mellitus without complications: Secondary | ICD-10-CM | POA: Insufficient documentation

## 2017-06-08 DIAGNOSIS — I10 Essential (primary) hypertension: Secondary | ICD-10-CM | POA: Diagnosis not present

## 2017-06-08 DIAGNOSIS — R112 Nausea with vomiting, unspecified: Secondary | ICD-10-CM

## 2017-06-08 LAB — CBC WITH DIFFERENTIAL/PLATELET
Basophils Absolute: 0.1 10*3/uL (ref 0–0.1)
Basophils Relative: 1 %
EOS PCT: 1 %
Eosinophils Absolute: 0.1 10*3/uL (ref 0–0.7)
HCT: 36.6 % (ref 35.0–47.0)
Hemoglobin: 11.4 g/dL — ABNORMAL LOW (ref 12.0–16.0)
Lymphocytes Relative: 23 %
Lymphs Abs: 3 10*3/uL (ref 1.0–3.6)
MCH: 23.2 pg — ABNORMAL LOW (ref 26.0–34.0)
MCHC: 31.1 g/dL — ABNORMAL LOW (ref 32.0–36.0)
MCV: 74.6 fL — ABNORMAL LOW (ref 80.0–100.0)
Monocytes Absolute: 1.1 10*3/uL — ABNORMAL HIGH (ref 0.2–0.9)
Monocytes Relative: 8 %
Neutro Abs: 8.9 10*3/uL — ABNORMAL HIGH (ref 1.4–6.5)
Neutrophils Relative %: 67 %
PLATELETS: 382 10*3/uL (ref 150–440)
RBC: 4.9 MIL/uL (ref 3.80–5.20)
RDW: 15.5 % — ABNORMAL HIGH (ref 11.5–14.5)
WBC: 13.1 10*3/uL — AB (ref 3.6–11.0)

## 2017-06-08 LAB — URINALYSIS, COMPLETE (UACMP) WITH MICROSCOPIC
Bilirubin Urine: NEGATIVE
Glucose, UA: 150 mg/dL — AB
Ketones, ur: NEGATIVE mg/dL
Leukocytes, UA: NEGATIVE
NITRITE: NEGATIVE
Protein, ur: NEGATIVE mg/dL
Specific Gravity, Urine: 1.012 (ref 1.005–1.030)
pH: 6 (ref 5.0–8.0)

## 2017-06-08 LAB — LIPASE, BLOOD: Lipase: 37 U/L (ref 11–51)

## 2017-06-08 LAB — COMPREHENSIVE METABOLIC PANEL
ALT: 27 U/L (ref 14–54)
AST: 28 U/L (ref 15–41)
Albumin: 4.3 g/dL (ref 3.5–5.0)
Alkaline Phosphatase: 126 U/L (ref 38–126)
Anion gap: 13 (ref 5–15)
BUN: 12 mg/dL (ref 6–20)
CHLORIDE: 104 mmol/L (ref 101–111)
CO2: 22 mmol/L (ref 22–32)
CREATININE: 0.85 mg/dL (ref 0.44–1.00)
Calcium: 9.7 mg/dL (ref 8.9–10.3)
GFR calc non Af Amer: >60 mL/min (ref >60)
Glucose, Bld: 258 mg/dL — ABNORMAL HIGH (ref 65–99)
Potassium: 3.9 mmol/L (ref 3.5–5.1)
SODIUM: 139 mmol/L (ref 135–145)
Total Bilirubin: 0.3 mg/dL (ref 0.3–1.2)
Total Protein: 7.9 g/dL (ref 6.5–8.1)

## 2017-06-08 MED ORDER — ONDANSETRON HCL 4 MG/2ML IJ SOLN
4.0000 mg | Freq: Once | INTRAMUSCULAR | Status: AC
  Administered 2017-06-08: 4 mg via INTRAVENOUS
  Filled 2017-06-08: qty 2

## 2017-06-08 MED ORDER — IOPAMIDOL (ISOVUE-370) INJECTION 76%
100.0000 mL | Freq: Once | INTRAVENOUS | Status: AC | PRN
  Administered 2017-06-08: 100 mL via INTRAVENOUS

## 2017-06-08 MED ORDER — SODIUM CHLORIDE 0.9 % IV BOLUS (SEPSIS)
500.0000 mL | Freq: Once | INTRAVENOUS | Status: AC
  Administered 2017-06-08: 500 mL via INTRAVENOUS

## 2017-06-08 MED ORDER — METOCLOPRAMIDE HCL 5 MG/ML IJ SOLN
10.0000 mg | Freq: Once | INTRAMUSCULAR | Status: AC
  Administered 2017-06-08: 10 mg via INTRAVENOUS
  Filled 2017-06-08: qty 2

## 2017-06-08 MED ORDER — IOPAMIDOL (ISOVUE-300) INJECTION 61%
30.0000 mL | Freq: Once | INTRAVENOUS | Status: AC | PRN
  Administered 2017-06-08: 30 mL via ORAL

## 2017-06-08 NOTE — ED Notes (Signed)
Pt verbalized understanding of discharge instructions. NAD at this time. 

## 2017-06-08 NOTE — ED Provider Notes (Signed)
[facility] Emergency Department Provider Note   ____________________________________________   First MD Initiated Contact with Patient 06/08/17 1221     (approximate)  I have reviewed the triage vital signs and the nursing notes.   HISTORY  Chief Complaint Nausea and Emesis    HPI Christine Mcneil is a 52 y.o. female for evaluation of nausea and vomiting for 3 days  Patient reports that she had a headache and was seen in the ER about 3 days ago, she was given Reglan and her headache and symptoms got better.  However about a day later she started experiencing nausea and vomiting.  She reports that she has had one bowel movement today that was well formed.  She does have gastroparesis and from time to time and can feel similar to this.  She reports moderate discomfort and pain mostly with vomiting.  Reports she had acidic sensation in the back of her throat after vomiting.  She reports that she has not a black or bloody vomit.  No chest pain or trouble breathing.  No fevers or chills, but it feels like she has "stomach virus".  Reports her pain is mild, mostly when she vomits.  Vomited once today and about 3 times yesterday  Past Medical History:  Diagnosis Date  . Anxiety and depression   . Chronic pain   . Diabetes mellitus without complication ([place])   . Hypertension   . Migraines   . Prothrombin gene mutation ([place])   . Thyroid disease   . TIA (transient ischemic attack) 05/27/2017    Patient Active Problem List   Diagnosis Date Noted  . Shortness of breath 01/03/2017    Past Surgical History:  Procedure Laterality Date  . ABDOMINAL HYSTERECTOMY    . CHOLECYSTECTOMY    . ROTATOR CUFF REPAIR Right     Prior to Admission medications   Medication Sig Start Date End Date Taking? Authorizing Provider  acetaZOLAMIDE (DIAMOX) 500 MG capsule Take 500 mg by mouth daily. 11/18/16   [provider]  albuterol (PROVENTIL HFA) 108 (90 Base)  MCG/ACT inhaler Inhale 2 puffs into the lungs every 4 (four) hours as needed for wheezing or shortness of breath. 12/12/16   Carrie Mew, MD  aspirin-acetaminophen-caffeine (EXCEDRIN MIGRAINE) [phone] MG tablet Take 3 tablets by mouth every 8 (eight) hours as needed for headache or migraine.    [provider]  atorvastatin (LIPITOR) 40 MG tablet Take 40 mg by mouth daily. 07/11/15   [provider]  buPROPion (WELLBUTRIN SR) 150 MG 12 hr tablet Take 300 mg by mouth daily. 10/30/15   [provider]  Cholecalciferol (VITAMIN D3) 1000 units CAPS Take 1 capsule by mouth daily.    [provider]  clonazePAM (KLONOPIN) 1 MG tablet Take 1 mg by mouth daily as needed. 12/07/16   [provider]  dabigatran (PRADAXA) 150 MG CAPS capsule Take 150 mg by mouth 2 (two) times daily. 10/12/16 10/12/17  [provider]  DULoxetine (CYMBALTA) 60 MG capsule Take 60 mg by mouth 2 (two) times daily. 03/15/11   [provider]  esomeprazole (NEXIUM) 40 MG capsule Take 40 mg by mouth daily.    [provider]  hydrOXYzine (ATARAX/VISTARIL) 50 MG tablet Take 50 mg by mouth 2 (two) times daily. 11/06/15   [provider]  ketorolac (TORADOL) 10 MG tablet Take 1 tablet (10 mg total) by mouth every 6 (six) hours as needed. 04/20/17   Versie Starks, PA-C  levothyroxine (SYNTHROID, LEVOTHROID) 100 MCG tablet Take 100 mcg by mouth daily. 08/18/15   [provider]  loratadine (CLARITIN) 10 MG tablet Take 10 mg by mouth daily as needed for allergies.    [provider]  MAGNESIUM PO Take 500 mg by mouth 2 (two) times daily.    [provider]  metFORMIN (GLUCOPHAGE) 500 MG tablet Take 500 mg by mouth 2 (two) times daily.    [provider]  metoCLOPramide (REGLAN) 10 MG tablet Take 1 tablet (10 mg total) by mouth every 8 (eight) hours as needed for up to 3 days for nausea. 06/05/17 06/08/17  Rudene Re,  MD  Milnacipran (SAVELLA) 50 MG TABS tablet Take 50 mg by mouth 2 (two) times daily.  10/08/16 10/08/17  [provider]  Misc Natural Products ([place] MSM FORMULA PO) Take 1 tablet by mouth 2 (two) times daily.    [provider]  ondansetron (ZOFRAN-ODT) 4 MG disintegrating tablet Take 4 mg by mouth every 8 (eight) hours as needed for nausea or vomiting.    [provider]  promethazine (PHENERGAN) 25 MG tablet Take 25 mg by mouth every 8 (eight) hours as needed for nausea or vomiting.    [provider]  quinapril (ACCUPRIL) 10 MG tablet Take 10 mg by mouth daily. 05/01/13   [provider]  ranitidine (ZANTAC) 150 MG tablet Take 150 mg by mouth 2 (two) times daily. 01/20/15   [provider]  rizatriptan (MAXALT) 10 MG tablet Take 1 tab at migraine onset. May repeat in 2 hours prn.  Limit to 2 in 24 hours and to 2 days weekly. 09/09/15   [provider]  sitaGLIPtin (JANUVIA) 100 MG tablet Take 100 mg by mouth daily.    [provider]  Spacer/Aero-Holding Chambers (AEROCHAMBER PLUS WITH MASK) inhaler Use as instructed 12/12/16   Carrie Mew, MD  traMADol (ULTRAM) 50 MG tablet Take 1 tablet (50 mg total) by mouth every 6 (six) hours as needed. 04/20/17 04/20/18  Caryn Section Linden Dolin, PA-C  zolpidem (AMBIEN) 10 MG tablet Take 10 mg by mouth daily. 12/16/16 01/15/17  [provider]    Allergies Amoxicillin and Keflex [cephalexin]  Family History  Problem Relation Age of Onset  . Diabetes Mother   . Hyperlipidemia Mother   . Hypertension Mother   . COPD Mother   . Diabetes Father   . Hyperlipidemia Father   . Hypertension Father   . Thyroid disease Father     Social History Social History   Tobacco Use  . Smoking status: Never Smoker  . Smokeless tobacco: Never Used  Substance Use Topics  . Alcohol use: No  . Drug use: No    Review of Systems Constitutional: No fever/chills Eyes: No visual  changes. ENT: No sore throat.  Some acidic taste in her mouth at times. Cardiovascular: Denies chest pain. Respiratory: Denies shortness of breath. Gastrointestinal:  No diarrhea.  No constipation. Genitourinary: Negative for dysuria.  Previous hysterectomy. Musculoskeletal: Negative for back pain. Skin: Negative for rash. Neurological: Negative for headaches, focal weakness or numbness.    ____________________________________________   PHYSICAL EXAM:  VITAL SIGNS: ED Triage Vitals  Enc Vitals Group     BP 06/08/17 0852 (!) 139/98     Pulse Rate 06/08/17 0852 (!) 105     Resp 06/08/17 0852 18     Temp 06/08/17 0852 97.7 F (36.5 C)     Temp Source 06/08/17 0852 Oral  SpO2 06/08/17 0852 99 %     Weight --      Height --      Head Circumference --      Peak Flow --      Pain Score 06/08/17 0857 6     Pain Loc --      Pain Edu? --      Excl. in [place]? --     Constitutional: Alert and oriented. Well appearing and in no acute distress. Eyes: Conjunctivae are normal. Head: Atraumatic. Nose: No congestion/rhinnorhea. Mouth/Throat: Mucous membranes are moist. Neck: No stridor.   Cardiovascular: Normal rate, regular rhythm. Grossly normal heart sounds.  Good peripheral circulation. Respiratory: Normal respiratory effort.  No retractions. Lungs CTAB. Gastrointestinal: Soft and mild tenderness throughout without rebound or guarding.  No focal tenderness to noted, but she reports tenderness on exam throughout all quadrants. No distention. Musculoskeletal: No lower extremity tenderness nor edema. Neurologic:  Normal speech and language. No gross focal neurologic deficits are appreciated.  Skin:  Skin is warm, dry and intact. No rash noted. Psychiatric: Mood and affect are normal. Speech and behavior are normal.  ____________________________________________   LABS (all labs ordered are listed, but only abnormal results are displayed)  Labs Reviewed  URINALYSIS, COMPLETE  (UACMP) WITH MICROSCOPIC - Abnormal; Notable for the following components:      Result Value   Color, Urine AMBER (*)    APPearance CLOUDY (*)    Glucose, UA 150 (*)    Hgb urine dipstick SMALL (*)    Bacteria, UA RARE (*)    Squamous Epithelial / LPF 0-5 (*)    All other components within normal limits  CBC WITH DIFFERENTIAL/PLATELET - Abnormal; Notable for the following components:   WBC 13.1 (*)    Hemoglobin 11.4 (*)    MCV 74.6 (*)    MCH 23.2 (*)    MCHC 31.1 (*)    RDW 15.5 (*)    Neutro Abs 8.9 (*)    Monocytes Absolute 1.1 (*)    All other components within normal limits  COMPREHENSIVE METABOLIC PANEL - Abnormal; Notable for the following components:   Glucose, Bld 258 (*)    All other components within normal limits  LIPASE, BLOOD   ____________________________________________  EKG  Reviewed and are by me at 1435 Heart rate 99 QRS 100 QTC 450 Normal sinus rhythm, no evidence of acute ST elevation or ischemic changes noted. ____________________________________________  RADIOLOGY  Ct Abdomen Pelvis W Contrast  Result Date: 06/08/2017 CLINICAL DATA:  Nausea and vomiting EXAM: CT ABDOMEN AND PELVIS WITH CONTRAST TECHNIQUE: Multidetector CT imaging of the abdomen and pelvis was performed using the standard protocol following bolus administration of intravenous contrast. CONTRAST:  195mL ISOVUE-370 IOPAMIDOL (ISOVUE-370) INJECTION 76% COMPARISON:  08/20/2012 CT abdomen pelvis FINDINGS: Lower chest: No acute abnormality. Hepatobiliary: No focal liver abnormality is seen. Status post cholecystectomy. No biliary dilatation. Pancreas: Unremarkable. No pancreatic ductal dilatation or surrounding inflammatory changes. Spleen: Normal in size without focal abnormality. Adrenals/Urinary Tract: Adrenal glands are within normal limits. Stable cysts within the kidneys. No hydronephrosis. 3 mm nonobstructing stone lower pole left kidney. Bladder unremarkable Stomach/Bowel: Stomach is  within normal limits. Appendix appears normal. No evidence of bowel wall thickening, distention, or inflammatory changes. Vascular/Lymphatic: Aortic atherosclerosis. No enlarged abdominal or pelvic lymph nodes. Reproductive: Status post hysterectomy. No adnexal masses. Other: Negative for free air or free fluid. Tiny fat in the umbilical region Musculoskeletal: No acute or significant osseous findings. IMPRESSION: 1. No  CT evidence for acute intra-abdominal or pelvic abnormality. 2. Nonobstructing stone in the left kidney. Electronically Signed   By: Donavan Foil M.D.   On: 06/08/2017 14:06    Scan reviewed, no acute findings.  Nonobstructing stone left kidney ____________________________________________   PROCEDURES  Procedure(s) performed: None  Procedures  Critical Care performed: No  ____________________________________________   INITIAL IMPRESSION / ASSESSMENT AND PLAN / ED COURSE  Pertinent labs & imaging results that were available during my care of the patient were reviewed by me and considered in my medical decision making (see chart for details).  Differential diagnosis includes but is not limited to, abdominal perforation, aortic dissection, cholecystitis, appendicitis, diverticulitis, colitis, esophagitis/gastritis, kidney stone, pyelonephritis, urinary tract infection, aortic aneurysm. All are considered in decision and treatment plan. Based upon the patient's presentation and risk factors, discussion with the patient regarding risks and benefits of CT scan we will proceed with a CT.  She does have diabetes and a history of gastroparesis, also reports feels that she has a stomach flu.  Overall reassuring examination and clinical exam, but given her age, history of diabetes, will obtain CT to further evaluate to the ongoing nature of her nausea and vomiting and assure no evidence of infectious or other concerning etiology.  Denies any cardiac neurologic or pulmonary symptoms.   Fully awake and alert in no distress.  Did not wish for any pain medication primarily asking for nausea control.     ----------------------------------------- 2:29 PM on 06/08/2017 -----------------------------------------  Patient reports she is feeling better.  Some ongoing mild nausea but no ongoing vomiting.  She does report she feels quite a bit better especially after fluids.  She is comfortable with plan for discharge reports she already has prescriptions for both Zofran and Reglan at home and sufficient quantity does not wish for any additional prescriptions.  I will check an EKG though she does not complain of the chest related symptoms because she is diabetic and having some ongoing nausea though I doubt this represents cardiac disease.  Discussed with patient she is comfortable plan for discharge, if her EKG looks good then I plan to discharge her to home. Return precautions and treatment recommendations and follow-up discussed with the patient who is agreeable with the plan.   ____________________________________________   FINAL CLINICAL IMPRESSION(S) / ED DIAGNOSES  Final diagnoses:  Non-intractable vomiting with nausea, unspecified vomiting type      NEW MEDICATIONS STARTED DURING THIS VISIT:  New Prescriptions   No medications on file     Note:  This document was prepared using Dragon voice recognition software and may include unintentional dictation errors.     Delman Kitten, MD 06/08/17 [phone]

## 2017-06-08 NOTE — ED Notes (Signed)
Sitting in WC, NAD.  Explained wait to patient, she is accepting of the wait.

## 2017-06-08 NOTE — ED Notes (Signed)
Patient from POV via [facility] complaining of N&V, seen here 06/05/17 for same.  Returns because of no improvement.  Alert and oriented.  Cheeks flushed.

## 2017-06-08 NOTE — Discharge Instructions (Signed)
You have been seen in the Emergency Department (ED) today for nausea and vomiting.  Your work up today has not shown a clear cause for your symptoms. ° °Follow up with your doctor as soon as possible regarding today?s emergent visit and your symptoms of nausea.  ° °Return to the ED if you develop abdominal, bloody vomiting, bloody diarrhea, if you are unable to tolerate fluids due to vomiting, or if you develop other symptoms that concern you. ° °

## 2017-06-20 ENCOUNTER — Other Ambulatory Visit: Payer: Self-pay

## 2017-06-20 ENCOUNTER — Observation Stay
Admission: EM | Admit: 2017-06-20 | Discharge: 2017-06-22 | Disposition: A | Discharge status: Home / Self Care | Payer: Medicare Other | Attending: Internal Medicine | Admitting: Internal Medicine

## 2017-06-20 ENCOUNTER — Encounter: Payer: Self-pay | Admitting: Intensive Care

## 2017-06-20 ENCOUNTER — Emergency Department: Payer: Medicare Other

## 2017-06-20 DIAGNOSIS — G459 Transient cerebral ischemic attack, unspecified: Principal | ICD-10-CM | POA: Insufficient documentation

## 2017-06-20 DIAGNOSIS — Z23 Encounter for immunization: Secondary | ICD-10-CM | POA: Diagnosis not present

## 2017-06-20 DIAGNOSIS — Z833 Family history of diabetes mellitus: Secondary | ICD-10-CM | POA: Insufficient documentation

## 2017-06-20 DIAGNOSIS — E039 Hypothyroidism, unspecified: Secondary | ICD-10-CM | POA: Insufficient documentation

## 2017-06-20 DIAGNOSIS — E785 Hyperlipidemia, unspecified: Secondary | ICD-10-CM | POA: Diagnosis not present

## 2017-06-20 DIAGNOSIS — R200 Anesthesia of skin: Secondary | ICD-10-CM | POA: Diagnosis not present

## 2017-06-20 DIAGNOSIS — Z7982 Long term (current) use of aspirin: Secondary | ICD-10-CM | POA: Diagnosis not present

## 2017-06-20 DIAGNOSIS — Z9049 Acquired absence of other specified parts of digestive tract: Secondary | ICD-10-CM | POA: Insufficient documentation

## 2017-06-20 DIAGNOSIS — F329 Major depressive disorder, single episode, unspecified: Secondary | ICD-10-CM | POA: Insufficient documentation

## 2017-06-20 DIAGNOSIS — Z7901 Long term (current) use of anticoagulants: Secondary | ICD-10-CM | POA: Diagnosis not present

## 2017-06-20 DIAGNOSIS — Z823 Family history of stroke: Secondary | ICD-10-CM | POA: Insufficient documentation

## 2017-06-20 DIAGNOSIS — R1310 Dysphagia, unspecified: Secondary | ICD-10-CM | POA: Diagnosis present

## 2017-06-20 DIAGNOSIS — Z7984 Long term (current) use of oral hypoglycemic drugs: Secondary | ICD-10-CM | POA: Insufficient documentation

## 2017-06-20 DIAGNOSIS — R29700 NIHSS score 0: Secondary | ICD-10-CM | POA: Insufficient documentation

## 2017-06-20 DIAGNOSIS — Z90710 Acquired absence of both cervix and uterus: Secondary | ICD-10-CM | POA: Diagnosis not present

## 2017-06-20 DIAGNOSIS — Z881 Allergy status to other antibiotic agents status: Secondary | ICD-10-CM | POA: Insufficient documentation

## 2017-06-20 DIAGNOSIS — G08 Intracranial and intraspinal phlebitis and thrombophlebitis: Secondary | ICD-10-CM | POA: Insufficient documentation

## 2017-06-20 DIAGNOSIS — R4781 Slurred speech: Secondary | ICD-10-CM | POA: Insufficient documentation

## 2017-06-20 DIAGNOSIS — G43909 Migraine, unspecified, not intractable, without status migrainosus: Secondary | ICD-10-CM | POA: Diagnosis not present

## 2017-06-20 DIAGNOSIS — Z880 Allergy status to penicillin: Secondary | ICD-10-CM | POA: Insufficient documentation

## 2017-06-20 DIAGNOSIS — Z79899 Other long term (current) drug therapy: Secondary | ICD-10-CM | POA: Insufficient documentation

## 2017-06-20 DIAGNOSIS — F419 Anxiety disorder, unspecified: Secondary | ICD-10-CM | POA: Insufficient documentation

## 2017-06-20 DIAGNOSIS — Z8349 Family history of other endocrine, nutritional and metabolic diseases: Secondary | ICD-10-CM | POA: Insufficient documentation

## 2017-06-20 DIAGNOSIS — M160 Bilateral primary osteoarthritis of hip: Secondary | ICD-10-CM | POA: Insufficient documentation

## 2017-06-20 DIAGNOSIS — G4730 Sleep apnea, unspecified: Secondary | ICD-10-CM | POA: Insufficient documentation

## 2017-06-20 DIAGNOSIS — E119 Type 2 diabetes mellitus without complications: Secondary | ICD-10-CM | POA: Diagnosis not present

## 2017-06-20 DIAGNOSIS — Z825 Family history of asthma and other chronic lower respiratory diseases: Secondary | ICD-10-CM | POA: Insufficient documentation

## 2017-06-20 DIAGNOSIS — D6852 Prothrombin gene mutation: Secondary | ICD-10-CM | POA: Insufficient documentation

## 2017-06-20 DIAGNOSIS — G8929 Other chronic pain: Secondary | ICD-10-CM | POA: Insufficient documentation

## 2017-06-20 DIAGNOSIS — Z8673 Personal history of transient ischemic attack (TIA), and cerebral infarction without residual deficits: Secondary | ICD-10-CM | POA: Diagnosis not present

## 2017-06-20 DIAGNOSIS — K219 Gastro-esophageal reflux disease without esophagitis: Secondary | ICD-10-CM | POA: Diagnosis not present

## 2017-06-20 DIAGNOSIS — E1165 Type 2 diabetes mellitus with hyperglycemia: Secondary | ICD-10-CM

## 2017-06-20 DIAGNOSIS — I639 Cerebral infarction, unspecified: Secondary | ICD-10-CM

## 2017-06-20 DIAGNOSIS — Z8249 Family history of ischemic heart disease and other diseases of the circulatory system: Secondary | ICD-10-CM | POA: Insufficient documentation

## 2017-06-20 DIAGNOSIS — M797 Fibromyalgia: Secondary | ICD-10-CM | POA: Insufficient documentation

## 2017-06-20 DIAGNOSIS — I10 Essential (primary) hypertension: Secondary | ICD-10-CM | POA: Insufficient documentation

## 2017-06-20 LAB — DIFFERENTIAL
Basophils Absolute: 0.1 10*3/uL (ref 0–0.1)
Basophils Relative: 1 %
EOS PCT: 2 %
Eosinophils Absolute: 0.2 10*3/uL (ref 0–0.7)
LYMPHS PCT: 35 %
Lymphs Abs: 3.9 10*3/uL — ABNORMAL HIGH (ref 1.0–3.6)
MONO ABS: 0.9 10*3/uL (ref 0.2–0.9)
MONOS PCT: 8 %
NEUTROS ABS: 6 10*3/uL (ref 1.4–6.5)
Neutrophils Relative %: 54 %

## 2017-06-20 LAB — COMPREHENSIVE METABOLIC PANEL
ALK PHOS: 108 U/L (ref 38–126)
ALT: 30 U/L (ref 14–54)
ANION GAP: 10 (ref 5–15)
AST: 22 U/L (ref 15–41)
Albumin: 4.4 g/dL (ref 3.5–5.0)
BUN: 12 mg/dL (ref 6–20)
CALCIUM: 9.2 mg/dL (ref 8.9–10.3)
CO2: 23 mmol/L (ref 22–32)
CREATININE: 0.82 mg/dL (ref 0.44–1.00)
Chloride: 104 mmol/L (ref 101–111)
Glucose, Bld: 175 mg/dL — ABNORMAL HIGH (ref 65–99)
Potassium: 3.9 mmol/L (ref 3.5–5.1)
SODIUM: 137 mmol/L (ref 135–145)
TOTAL PROTEIN: 8 g/dL (ref 6.5–8.1)
Total Bilirubin: 0.6 mg/dL (ref 0.3–1.2)

## 2017-06-20 LAB — CBC
HEMATOCRIT: 36.2 % (ref 35.0–47.0)
Hemoglobin: 11.3 g/dL — ABNORMAL LOW (ref 12.0–16.0)
MCH: 23.1 pg — AB (ref 26.0–34.0)
MCHC: 31.3 g/dL — ABNORMAL LOW (ref 32.0–36.0)
MCV: 73.7 fL — AB (ref 80.0–100.0)
PLATELETS: 364 10*3/uL (ref 150–440)
RBC: 4.91 MIL/uL (ref 3.80–5.20)
RDW: 16.2 % — AB (ref 11.5–14.5)
WBC: 11 10*3/uL (ref 3.6–11.0)

## 2017-06-20 LAB — GLUCOSE, CAPILLARY: GLUCOSE-CAPILLARY: 156 mg/dL — AB (ref 65–99)

## 2017-06-20 LAB — PROTIME-INR
INR: 1.27
PROTHROMBIN TIME: 15.8 s — AB (ref 11.4–15.2)

## 2017-06-20 LAB — TROPONIN I

## 2017-06-20 LAB — APTT: aPTT: 43 seconds — ABNORMAL HIGH (ref 24–36)

## 2017-06-20 MED ORDER — FAMOTIDINE 20 MG PO TABS
20.0000 mg | ORAL_TABLET | Freq: Two times a day (BID) | ORAL | Status: DC
  Administered 2017-06-20 – 2017-06-22 (×4): 20 mg via ORAL
  Filled 2017-06-20 (×4): qty 1

## 2017-06-20 MED ORDER — PROPRANOLOL HCL 10 MG PO TABS
10.0000 mg | ORAL_TABLET | Freq: Three times a day (TID) | ORAL | Status: DC
  Administered 2017-06-20 – 2017-06-22 (×4): 10 mg via ORAL
  Filled 2017-06-20 (×8): qty 1

## 2017-06-20 MED ORDER — METFORMIN HCL 500 MG PO TABS
500.0000 mg | ORAL_TABLET | Freq: Two times a day (BID) | ORAL | Status: DC
  Administered 2017-06-21 – 2017-06-22 (×3): 500 mg via ORAL
  Filled 2017-06-20 (×4): qty 1

## 2017-06-20 MED ORDER — ONDANSETRON HCL 4 MG PO TABS
4.0000 mg | ORAL_TABLET | Freq: Four times a day (QID) | ORAL | Status: DC | PRN
Start: 2017-06-20 — End: 2017-06-22

## 2017-06-20 MED ORDER — ACETAMINOPHEN 325 MG PO TABS
650.0000 mg | ORAL_TABLET | Freq: Four times a day (QID) | ORAL | Status: DC | PRN
Start: 2017-06-20 — End: 2017-06-22
  Administered 2017-06-21 (×2): 650 mg via ORAL
  Filled 2017-06-20 (×3): qty 2

## 2017-06-20 MED ORDER — PANTOPRAZOLE SODIUM 40 MG PO TBEC
40.0000 mg | DELAYED_RELEASE_TABLET | Freq: Every day | ORAL | Status: DC
  Administered 2017-06-21 – 2017-06-22 (×2): 40 mg via ORAL
  Filled 2017-06-20 (×2): qty 1

## 2017-06-20 MED ORDER — ASPIRIN-ACETAMINOPHEN-CAFFEINE 250-250-65 MG PO TABS
2.0000 | ORAL_TABLET | Freq: Three times a day (TID) | ORAL | Status: DC | PRN
  Administered 2017-06-21 – 2017-06-22 (×2): 2 via ORAL
  Filled 2017-06-20 (×3): qty 2

## 2017-06-20 MED ORDER — CLONAZEPAM 0.5 MG PO TABS
1.0000 mg | ORAL_TABLET | Freq: Every day | ORAL | Status: DC | PRN
  Administered 2017-06-20: 23:00:00 1 mg via ORAL
  Filled 2017-06-20: qty 2

## 2017-06-20 MED ORDER — DULOXETINE HCL 30 MG PO CPEP
60.0000 mg | ORAL_CAPSULE | Freq: Two times a day (BID) | ORAL | Status: DC
  Administered 2017-06-21 – 2017-06-22 (×3): 60 mg via ORAL
  Filled 2017-06-20 (×4): qty 2

## 2017-06-20 MED ORDER — LISINOPRIL 10 MG PO TABS
10.0000 mg | ORAL_TABLET | Freq: Every day | ORAL | Status: DC
  Administered 2017-06-21 – 2017-06-22 (×2): 10 mg via ORAL
  Filled 2017-06-20 (×2): qty 1

## 2017-06-20 MED ORDER — ASPIRIN 81 MG PO CHEW
324.0000 mg | CHEWABLE_TABLET | Freq: Once | ORAL | Status: AC
  Administered 2017-06-20: 324 mg via ORAL
  Filled 2017-06-20: qty 4

## 2017-06-20 MED ORDER — METOCLOPRAMIDE HCL 5 MG/ML IJ SOLN
10.0000 mg | Freq: Once | INTRAMUSCULAR | Status: AC
  Administered 2017-06-20: 10 mg via INTRAVENOUS
  Filled 2017-06-20: qty 2

## 2017-06-20 MED ORDER — VITAMIN D 1000 UNITS PO TABS
1000.0000 [IU] | ORAL_TABLET | Freq: Every day | ORAL | Status: DC
  Administered 2017-06-21 – 2017-06-22 (×2): 1000 [IU] via ORAL
  Filled 2017-06-20 (×2): qty 1

## 2017-06-20 MED ORDER — BUPROPION HCL ER (SR) 150 MG PO TB12
300.0000 mg | ORAL_TABLET | Freq: Every day | ORAL | Status: DC
  Administered 2017-06-21 – 2017-06-22 (×2): 300 mg via ORAL
  Filled 2017-06-20 (×2): qty 2

## 2017-06-20 MED ORDER — TAPENTADOL HCL ER 50 MG PO TB12
100.0000 mg | ORAL_TABLET | Freq: Two times a day (BID) | ORAL | Status: DC
  Administered 2017-06-20 – 2017-06-22 (×4): 100 mg via ORAL
  Filled 2017-06-20 (×4): qty 2

## 2017-06-20 MED ORDER — LINAGLIPTIN 5 MG PO TABS
5.0000 mg | ORAL_TABLET | Freq: Every day | ORAL | Status: DC
  Administered 2017-06-21 – 2017-06-22 (×2): 5 mg via ORAL
  Filled 2017-06-20 (×2): qty 1

## 2017-06-20 MED ORDER — ALBUTEROL SULFATE (2.5 MG/3ML) 0.083% IN NEBU: 2.5000 mg | INHALATION_SOLUTION | RESPIRATORY_TRACT | Status: DC | PRN

## 2017-06-20 MED ORDER — PNEUMOCOCCAL VAC POLYVALENT 25 MCG/0.5ML IJ INJ
0.5000 mL | INJECTION | INTRAMUSCULAR | Status: AC
  Administered 2017-06-21: 16:00:00 0.5 mL via INTRAMUSCULAR
  Filled 2017-06-20: qty 0.5

## 2017-06-20 MED ORDER — LORATADINE 10 MG PO TABS: 10.0000 mg | ORAL_TABLET | Freq: Every day | ORAL | Status: DC | PRN

## 2017-06-20 MED ORDER — ATORVASTATIN CALCIUM 20 MG PO TABS
40.0000 mg | ORAL_TABLET | Freq: Every day | ORAL | Status: DC
  Administered 2017-06-21: 40 mg via ORAL
  Filled 2017-06-20: qty 2

## 2017-06-20 MED ORDER — LAMOTRIGINE 100 MG PO TABS
100.0000 mg | ORAL_TABLET | Freq: Every day | ORAL | Status: DC
  Administered 2017-06-21 – 2017-06-22 (×2): 100 mg via ORAL
  Filled 2017-06-20 (×2): qty 1

## 2017-06-20 MED ORDER — TRAMADOL HCL 50 MG PO TABS
50.0000 mg | ORAL_TABLET | Freq: Four times a day (QID) | ORAL | Status: DC | PRN
  Administered 2017-06-20 – 2017-06-21 (×2): 50 mg via ORAL
  Filled 2017-06-20 (×2): qty 1

## 2017-06-20 MED ORDER — GLIPIZIDE 5 MG PO TABS
5.0000 mg | ORAL_TABLET | Freq: Every day | ORAL | Status: DC
  Administered 2017-06-21: 10:00:00 5 mg via ORAL
  Filled 2017-06-20: qty 1

## 2017-06-20 MED ORDER — KETOROLAC TROMETHAMINE 30 MG/ML IJ SOLN
15.0000 mg | Freq: Once | INTRAMUSCULAR | Status: AC
  Administered 2017-06-20: 15 mg via INTRAVENOUS
  Filled 2017-06-20: qty 1

## 2017-06-20 MED ORDER — DABIGATRAN ETEXILATE MESYLATE 150 MG PO CAPS
150.0000 mg | ORAL_CAPSULE | Freq: Two times a day (BID) | ORAL | Status: DC
  Administered 2017-06-20 – 2017-06-22 (×4): 150 mg via ORAL
  Filled 2017-06-20 (×5): qty 1

## 2017-06-20 MED ORDER — VERAPAMIL HCL ER 240 MG PO TBCR
240.0000 mg | EXTENDED_RELEASE_TABLET | Freq: Every day | ORAL | Status: DC
  Administered 2017-06-21 – 2017-06-22 (×2): 240 mg via ORAL
  Filled 2017-06-20 (×2): qty 1

## 2017-06-20 MED ORDER — HYDROXYZINE HCL 50 MG PO TABS
50.0000 mg | ORAL_TABLET | Freq: Three times a day (TID) | ORAL | Status: DC | PRN
  Filled 2017-06-20: qty 1

## 2017-06-20 MED ORDER — ACETAMINOPHEN 650 MG RE SUPP: 650.0000 mg | Freq: Four times a day (QID) | RECTAL | Status: DC | PRN

## 2017-06-20 MED ORDER — ACETAZOLAMIDE ER 500 MG PO CP12
500.0000 mg | ORAL_CAPSULE | Freq: Every day | ORAL | Status: DC
  Administered 2017-06-21 – 2017-06-22 (×2): 500 mg via ORAL
  Filled 2017-06-20 (×3): qty 1

## 2017-06-20 MED ORDER — LEVOTHYROXINE SODIUM 100 MCG PO TABS
100.0000 ug | ORAL_TABLET | Freq: Every day | ORAL | Status: DC
  Administered 2017-06-21 – 2017-06-22 (×2): 100 ug via ORAL
  Filled 2017-06-20 (×2): qty 1

## 2017-06-20 MED ORDER — ONDANSETRON HCL 4 MG/2ML IJ SOLN
4.0000 mg | Freq: Four times a day (QID) | INTRAMUSCULAR | Status: DC | PRN
  Administered 2017-06-20 – 2017-06-21 (×2): 4 mg via INTRAVENOUS
  Filled 2017-06-20 (×2): qty 2

## 2017-06-20 MED ORDER — PROMETHAZINE HCL 25 MG PO TABS
25.0000 mg | ORAL_TABLET | Freq: Three times a day (TID) | ORAL | Status: DC | PRN
  Filled 2017-06-20: qty 1

## 2017-06-20 MED ORDER — MIRTAZAPINE 15 MG PO TABS
15.0000 mg | ORAL_TABLET | Freq: Every day | ORAL | Status: DC
  Administered 2017-06-20 – 2017-06-21 (×2): 15 mg via ORAL
  Filled 2017-06-20 (×2): qty 1

## 2017-06-20 NOTE — Code Documentation (Signed)
Pt arrives with sudden left arm burning and tingling, states she noticed it was difficult to swallow while trying to eat bread at 1400, code stroke initiated in triage, taken from CT to room 17h then 17, upon assessment pt continues to state left arm burning and difficulty swallowing, speech clear, able to communicate in no distress, pt flushed, states feeling hot, given a cold rag, no tPA due to anticoagulated on pradaxa and hx of cerebral clots with an anticoagulation disorder,  Report off to [facility], see stroke documentation for times

## 2017-06-20 NOTE — ED Provider Notes (Signed)
[facility] Emergency Department Provider Note ____________________________________________   First MD Initiated Contact with Patient 06/20/17 1526     (approximate)  I have reviewed the triage vital signs and the nursing notes.   HISTORY  Chief Complaint Code Stroke    HPI Christine Mcneil is a 52 y.o. female with past medical history as noted below who presents with difficulty swallowing, acute onset approximately 1.5 hours prior to arrival, associated with burning pain throughout the left side of her body.  Patient reports a similar episode several weeks ago which resolved on its own after she went to the hospital at [facility].  Patient denies associated headache, fevers, neck stiffness, vomiting, visual changes, or chest pain.  Past Medical History:  Diagnosis Date  . Anxiety and depression   . Chronic pain   . Diabetes mellitus without complication ([place])   . Hypertension   . Migraines   . Prothrombin gene mutation ([place])   . Thyroid disease   . TIA (transient ischemic attack) 05/27/2017    Patient Active Problem List   Diagnosis Date Noted  . Shortness of breath 01/03/2017    Past Surgical History:  Procedure Laterality Date  . ABDOMINAL HYSTERECTOMY    . CHOLECYSTECTOMY    . ROTATOR CUFF REPAIR Right     Prior to Admission medications   Medication Sig Start Date End Date Taking? Authorizing Provider  acetaZOLAMIDE (DIAMOX) 500 MG capsule Take 500 mg by mouth daily. 11/18/16  Yes [provider]  atorvastatin (LIPITOR) 40 MG tablet Take 40 mg by mouth daily. 07/11/15  Yes [provider]  buPROPion (WELLBUTRIN SR) 150 MG 12 hr tablet Take 300 mg by mouth daily. 10/30/15  Yes [provider]  Cholecalciferol (VITAMIN D3) 1000 units CAPS Take 1 capsule by mouth daily.   Yes [provider]  dabigatran (PRADAXA) 150 MG CAPS capsule Take 150 mg by mouth 2 (two) times daily. 10/12/16 10/12/17 Yes [provider]    DULoxetine (CYMBALTA) 60 MG capsule Take 60 mg by mouth 2 (two) times daily. 03/15/11  Yes [provider]  esomeprazole (NEXIUM) 40 MG capsule Take 40 mg by mouth daily.   Yes [provider]  glipiZIDE (GLUCOTROL) 5 MG tablet Take 1 tablet by mouth daily. 06/02/17  Yes [provider]  hydrOXYzine (ATARAX/VISTARIL) 50 MG tablet Take 50 mg by mouth 3 (three) times daily as needed.  11/06/15  Yes [provider]  lamoTRIgine (LAMICTAL) 100 MG tablet Take 1 tablet by mouth daily. 05/27/17  Yes [provider]  levothyroxine (SYNTHROID, LEVOTHROID) 100 MCG tablet Take 100 mcg by mouth daily. 08/18/15  Yes [provider]  loratadine (CLARITIN) 10 MG tablet Take 10 mg by mouth daily as needed for allergies.   Yes [provider]  MAGNESIUM PO Take 500 mg by mouth 2 (two) times daily.   Yes [provider]  metFORMIN (GLUCOPHAGE) 500 MG tablet Take 500 mg by mouth 2 (two) times daily.   Yes [provider]  mirtazapine (REMERON) 15 MG tablet Take 1 tablet by mouth at bedtime. 05/26/17  Yes [provider]  NUCYNTA ER 100 MG 12 hr tablet Take 1 tablet by mouth 2 (two) times daily. 05/30/17  Yes [provider]  propranolol (INDERAL) 10 MG tablet Take 1 tablet by mouth 3 (three) times daily. 06/06/17  Yes [provider]  quinapril (ACCUPRIL) 10 MG tablet Take 10 mg by mouth daily. 05/01/13  Yes [provider]  ranitidine (ZANTAC) 150 MG tablet Take 150 mg by mouth 2 (two) times daily. 01/20/15  Yes [provider]  sitaGLIPtin (JANUVIA) 100 MG tablet Take 100 mg by mouth daily.   Yes [provider]  verapamil (CALAN-SR) 240 MG CR tablet Take 1 tablet by mouth daily. 05/26/17  Yes [provider]  zolpidem (AMBIEN) 10 MG tablet Take 10 mg by mouth daily. 12/16/16 06/20/17 Yes [provider]  AIMOVIG 70 MG/ML SOAJ Inject 70 mg into the skin every 28 (twenty-eight)  days. 06/15/17   [provider]  albuterol (PROVENTIL HFA) 108 (90 Base) MCG/ACT inhaler Inhale 2 puffs into the lungs every 4 (four) hours as needed for wheezing or shortness of breath. 12/12/16   Carrie Mew, MD  aspirin-acetaminophen-caffeine (EXCEDRIN MIGRAINE) [phone] MG tablet Take 2 tablets by mouth every 8 (eight) hours as needed for headache or migraine.     [provider]  clonazePAM (KLONOPIN) 1 MG tablet Take 1 mg by mouth daily as needed. 12/07/16   [provider]  ketorolac (TORADOL) 10 MG tablet Take 1 tablet (10 mg total) by mouth every 6 (six) hours as needed. Patient not taking: Reported on 06/20/2017 04/20/17   Versie Starks, PA-C  metoCLOPramide (REGLAN) 10 MG tablet Take 1 tablet (10 mg total) by mouth every 8 (eight) hours as needed for up to 3 days for nausea. 06/05/17 06/08/17  Rudene Re, MD  ondansetron (ZOFRAN-ODT) 4 MG disintegrating tablet Take 4 mg by mouth every 8 (eight) hours as needed for nausea or vomiting.    [provider]  promethazine (PHENERGAN) 25 MG tablet Take 25 mg by mouth every 8 (eight) hours as needed for nausea or vomiting.    [provider]  rizatriptan (MAXALT) 10 MG tablet Take 1 tab at migraine onset. May repeat in 2 hours prn.  Limit to 2 in 24 hours and to 2 days weekly. 09/09/15   [provider]  Spacer/Aero-Holding Chambers (AEROCHAMBER PLUS WITH MASK) inhaler Use as instructed 12/12/16   Carrie Mew, MD  traMADol (ULTRAM) 50 MG tablet Take 1 tablet (50 mg total) by mouth every 6 (six) hours as needed. 04/20/17 04/20/18  Versie Starks, PA-C    Allergies Amoxicillin and Keflex [cephalexin]  Family History  Problem Relation Age of Onset  . Diabetes Mother   . Hyperlipidemia Mother   . Hypertension Mother   . COPD Mother   . CVA Mother   . Diabetes Father   . Hyperlipidemia Father   . Hypertension Father   . Thyroid disease Father     Social  History Social History   Tobacco Use  . Smoking status: Never Smoker  . Smokeless tobacco: Never Used  Substance Use Topics  . Alcohol use: No  . Drug use: No    Review of Systems  Constitutional: No fever. Eyes: No visual changes. ENT: No sore throat.  Positive for dysphagia. Cardiovascular: Denies chest pain. Respiratory: Denies shortness of breath. Gastrointestinal: No nausea, no vomiting.  No diarrhea.  Genitourinary: Negative for dysuria.  Musculoskeletal: Negative for back pain. Skin: Negative for rash. Neurological: Negative for headache.   ____________________________________________   PHYSICAL EXAM:  VITAL SIGNS: ED Triage Vitals  Enc Vitals Group     BP 06/20/17 1533 (!) 162/111     Pulse Rate 06/20/17 1533 (!) 106     Resp 06/20/17 1533 16     Temp 06/20/17 1533 98.2 F (36.8 C)  Temp Source 06/20/17 1533 Oral     SpO2 06/20/17 1533 96 %     Weight 06/20/17 1517 242 lb (109.8 kg)     Height 06/20/17 1517 5\' 6"  (1.676 m)     Head Circumference --      Peak Flow --      Pain Score --      Pain Loc --      Pain Edu? --      Excl. in [place]? --     Constitutional: Alert and oriented.  Uncomfortable appearing but in no acute distress. Eyes: Conjunctivae are normal.  EOMI.  PERRLA. Head: Atraumatic. Nose: No congestion/rhinnorhea. Mouth/Throat: Mucous membranes are moist.  Oropharynx clear with no swelling or abnormal secretions.  No stridor. Neck: Normal range of motion.  Cardiovascular: Normal rate, regular rhythm. Grossly normal heart sounds.  Good peripheral circulation. Respiratory: Normal respiratory effort.  No retractions. Lungs CTAB. Gastrointestinal: Soft and nontender. No distention.  Genitourinary: No lank tenderness. Musculoskeletal: No lower extremity edema.  Extremities warm and well perfused.  Neurologic:  Normal speech and language.  Motor and sensory intact in all extremities.  Normal coordination.   Skin:  Skin is warm and dry. No  rash noted. Psychiatric: Mood and affect are normal. Speech and behavior are normal.  ____________________________________________   LABS (all labs ordered are listed, but only abnormal results are displayed)  Labs Reviewed  PROTIME-INR - Abnormal; Notable for the following components:      Result Value   Prothrombin Time 15.8 (*)    All other components within normal limits  APTT - Abnormal; Notable for the following components:   aPTT 43 (*)    All other components within normal limits  CBC - Abnormal; Notable for the following components:   Hemoglobin 11.3 (*)    MCV 73.7 (*)    MCH 23.1 (*)    MCHC 31.3 (*)    RDW 16.2 (*)    All other components within normal limits  DIFFERENTIAL - Abnormal; Notable for the following components:   Lymphs Abs 3.9 (*)    All other components within normal limits  COMPREHENSIVE METABOLIC PANEL - Abnormal; Notable for the following components:   Glucose, Bld 175 (*)    All other components within normal limits  GLUCOSE, CAPILLARY - Abnormal; Notable for the following components:   Glucose-Capillary 156 (*)    All other components within normal limits  TROPONIN I  CBG MONITORING, ED  POC URINE PREG, ED   ____________________________________________  EKG  ED ECG REPORT I, Arta Silence, the attending physician, personally viewed and interpreted this ECG.  Date: 06/20/2017 EKG Time: 1530 Rate: 103 Rhythm: Sinus tachycardia QRS Axis: normal Intervals: normal ST/T Wave abnormalities: normal Narrative Interpretation: no evidence of acute ischemia; no significant change when compared to EKG of 06/08/2017 ____________________________________________  RADIOLOGY  CT head: no ICH or other acute findings  ____________________________________________   PROCEDURES  Procedure(s) performed: No    Critical Care performed: Yes  CRITICAL CARE Performed by: Arta Silence   Total critical care time: 35 minutes  Critical  care time was exclusive of separately billable procedures and treating other patients.  Critical care was necessary to treat or prevent imminent or life-threatening deterioration.  Critical care was time spent personally by me on the following activities: development of treatment plan with patient and/or surrogate as well as nursing, discussions with consultants, evaluation of patient's response to treatment, examination of patient, obtaining history from patient or surrogate, ordering  and performing treatments and interventions, ordering and review of laboratory studies, ordering and review of radiographic studies, pulse oximetry and re-evaluation of patient's condition. ____________________________________________   INITIAL IMPRESSION / ASSESSMENT AND PLAN / ED COURSE  Pertinent labs & imaging results that were available during my care of the patient were reviewed by me and considered in my medical decision making (see chart for details).  51 year old female with past medical history as noted above presents with acute onset of dysphagia approximately 1.5 hours prior to arrival associated with pain described as burning throughout the left side of her body.  Patient reports a similar episode that she had a few weeks ago at [facility] with negative workup at that time.  On exam, patient is uncomfortable but not acutely ill-appearing, and the neuro exam is held as described with no focal findings other than patient-reported dysphagia.  Past medical records reviewed in [place].  Patient was seen in ED at [facility] on 05/27/2017 with similar presentation and there was concern for possible CVA versus TIA, however her workup was negative and the symptoms resolved in the ED so the patient was discharged.  Copied from note on 05/27/17:   Assessment and Plan:   Ms. Christine Mcneil is a 52 y.o. 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 The patient was able to pass a nursing dysaphagia screen without difficulty and did not have any occlusive disease visualized on her imaging. Given she returned to baseline, we feel that no further intervention is necessary.   Patient's stroke risk factors include: hypercoagulable state.   tPA Decision: IV-tPA was not given within 4.5 hours because patient's symptoms completely resolved with NIHSS of 0.   Intervention:  2. No evidence of proximal occlusion  RECOMMENDATIONS: - No further intervention - We will let Dr. Dorann Lodge office know, and see if the patient can get a follow up scheduled.  - If patient develops migraine, please provide Migraine cocktail of Toradol Compazine and Benadryl.      Due to concern for possible acute CVA, code stroke was activated.  Patient was evaluated by tele-neurology.  I consulted with the tele-neurology provider; given that the patient had a stroke scale was 1 and the patient is on Pradaxa for hypercoagulable state, patient is not a candidate for TPA.  Initial CT head was negative.  Plan for aspirin, lab workup, and reassess.  Anticipate likely admission for stroke workup if symptoms persist.  ----------------------------------------- 6:27 PM on 06/20/2017 -----------------------------------------  Patient's pain is improved after Reglan and Toradol.  She has no new neurologic symptoms but does report continued dysphagia.  Based on neurology recommendations I will admit for stroke workup.  Patient agrees with the plan.  I signed the patient out to the hospitalist Dr. Verdell Carmine.    ____________________________________________   FINAL CLINICAL IMPRESSION(S) / ED DIAGNOSES  Final diagnoses:  Dysphagia, unspecified type      NEW  MEDICATIONS STARTED DURING THIS VISIT:  New Prescriptions   No medications on file     Note:  This document was prepared using Dragon voice recognition software and may include unintentional dictation errors.    Arta Silence, MD 06/20/17 1827

## 2017-06-20 NOTE — ED Triage Notes (Signed)
FIRST NURSE NOTE-slurred speech noted. Started 1 hr ago per pt. Started at 1400 per pt with left arm and leg burning and difficulty swallowing.

## 2017-06-20 NOTE — H&P (Signed)
[facility] NAME: Christine Mcneil    MR#:  [id number]  DATE OF BIRTH:  Sep 14, 1965  DATE OF ADMISSION:  06/20/2017  PRIMARY CARE PHYSICIAN: Zhou-Talbert, Elwyn Lade, MD   REQUESTING/REFERRING PHYSICIAN: Dr. Cherylann Banas  CHIEF COMPLAINT:   Chief Complaint  Patient presents with  . Code Stroke    HISTORY OF PRESENT ILLNESS:  Christine Mcneil  is a 52 y.o. female with a known history of fibromyalgia, chronic pain, anxiety/depression, hypertension, history of migraines, prothrombin gene mutation, hypothyroidism, previous history of TIA who presents to the hospital due to slurred speech, left-sided numbness. Patient says she was in usual state of health when she developed some slurred speech and left-sided numbness and therefore came to the ER for further evaluation. Patient continues to have persistent symptoms of left-sided numbness but no focal weakness and has intermittent slurred speech. And therefore hospitalist services were consulted for treatment evaluation. Patient CT head is negative for acute pathology here in the ER. Patient does admit to a headache and some nausea but no vomiting. She denies any chest pains, shortness of breath, fevers chills cough or any other associated symptoms.  PAST MEDICAL HISTORY:   Past Medical History:  Diagnosis Date  . Anxiety and depression   . Chronic pain   . Diabetes mellitus without complication ([place])   . Hypertension   . Migraines   . Prothrombin gene mutation ([place])   . Thyroid disease   . TIA (transient ischemic attack) 05/27/2017    PAST SURGICAL HISTORY:   Past Surgical History:  Procedure Laterality Date  . ABDOMINAL HYSTERECTOMY    . CHOLECYSTECTOMY    . ROTATOR CUFF REPAIR Right     SOCIAL HISTORY:   Social History   Tobacco Use  . Smoking status: Never Smoker  . Smokeless tobacco: Never Used  Substance Use Topics  . Alcohol use: No    FAMILY HISTORY:   Family History  Problem  Relation Age of Onset  . Diabetes Mother   . Hyperlipidemia Mother   . Hypertension Mother   . COPD Mother   . CVA Mother   . Diabetes Father   . Hyperlipidemia Father   . Hypertension Father   . Thyroid disease Father     DRUG ALLERGIES:   Allergies  Allergen Reactions  . Amoxicillin Itching  . Keflex [Cephalexin] Itching    REVIEW OF SYSTEMS:   Review of Systems  Constitutional: Negative for fever and weight loss.  HENT: Negative for congestion, nosebleeds and tinnitus.   Eyes: Negative for blurred vision, double vision and redness.  Respiratory: Negative for cough, hemoptysis and shortness of breath.   Cardiovascular: Negative for chest pain, orthopnea, leg swelling and PND.  Gastrointestinal: Negative for abdominal pain, diarrhea, melena, nausea and vomiting.  Genitourinary: Negative for dysuria, hematuria and urgency.  Musculoskeletal: Negative for falls and joint pain.  Neurological: Positive for headaches. Negative for dizziness, tingling, sensory change, focal weakness, seizures and weakness.  Endo/Heme/Allergies: Negative for polydipsia. Does not bruise/bleed easily.  Psychiatric/Behavioral: Negative for depression and memory loss. The patient is not nervous/anxious.     MEDICATIONS AT HOME:   Prior to Admission medications   Medication Sig Start Date End Date Taking? Authorizing Provider  acetaZOLAMIDE (DIAMOX) 500 MG capsule Take 500 mg by mouth daily. 11/18/16  Yes [provider]  atorvastatin (LIPITOR) 40 MG tablet Take 40 mg by mouth daily. 07/11/15  Yes [provider]  buPROPion [facility] SR) 150  MG 12 hr tablet Take 300 mg by mouth daily. 10/30/15  Yes [provider]  Cholecalciferol (VITAMIN D3) 1000 units CAPS Take 1 capsule by mouth daily.   Yes [provider]  dabigatran (PRADAXA) 150 MG CAPS capsule Take 150 mg by mouth 2 (two) times daily. 10/12/16 10/12/17 Yes [provider]  DULoxetine (CYMBALTA) 60 MG  capsule Take 60 mg by mouth 2 (two) times daily. 03/15/11  Yes [provider]  esomeprazole (NEXIUM) 40 MG capsule Take 40 mg by mouth daily.   Yes [provider]  glipiZIDE (GLUCOTROL) 5 MG tablet Take 1 tablet by mouth daily. 06/02/17  Yes [provider]  hydrOXYzine (ATARAX/VISTARIL) 50 MG tablet Take 50 mg by mouth 3 (three) times daily as needed.  11/06/15  Yes [provider]  lamoTRIgine (LAMICTAL) 100 MG tablet Take 1 tablet by mouth daily. 05/27/17  Yes [provider]  levothyroxine (SYNTHROID, LEVOTHROID) 100 MCG tablet Take 100 mcg by mouth daily. 08/18/15  Yes [provider]  loratadine (CLARITIN) 10 MG tablet Take 10 mg by mouth daily as needed for allergies.   Yes [provider]  MAGNESIUM PO Take 500 mg by mouth 2 (two) times daily.   Yes [provider]  metFORMIN (GLUCOPHAGE) 500 MG tablet Take 500 mg by mouth 2 (two) times daily.   Yes [provider]  mirtazapine (REMERON) 15 MG tablet Take 1 tablet by mouth at bedtime. 05/26/17  Yes [provider]  NUCYNTA ER 100 MG 12 hr tablet Take 1 tablet by mouth 2 (two) times daily. 05/30/17  Yes [provider]  propranolol (INDERAL) 10 MG tablet Take 1 tablet by mouth 3 (three) times daily. 06/06/17  Yes [provider]  quinapril (ACCUPRIL) 10 MG tablet Take 10 mg by mouth daily. 05/01/13  Yes [provider]  ranitidine (ZANTAC) 150 MG tablet Take 150 mg by mouth 2 (two) times daily. 01/20/15  Yes [provider]  sitaGLIPtin (JANUVIA) 100 MG tablet Take 100 mg by mouth daily.   Yes [provider]  verapamil (CALAN-SR) 240 MG CR tablet Take 1 tablet by mouth daily. 05/26/17  Yes [provider]  zolpidem (AMBIEN) 10 MG tablet Take 10 mg by mouth daily. 12/16/16 06/20/17 Yes [provider]  AIMOVIG 70 MG/ML SOAJ Inject 70 mg into the skin every 28 (twenty-eight) days. 06/15/17   [provider]  albuterol (PROVENTIL HFA) 108 (90 Base) MCG/ACT inhaler Inhale 2 puffs into the lungs every 4 (four) hours as needed for wheezing or shortness of breath. 12/12/16   Carrie Mew, MD  aspirin-acetaminophen-caffeine (EXCEDRIN MIGRAINE) [phone] MG tablet Take 2 tablets by mouth every 8 (eight) hours as needed for headache or migraine.     [provider]  clonazePAM (KLONOPIN) 1 MG tablet Take 1 mg by mouth daily as needed. 12/07/16   [provider]  ketorolac (TORADOL) 10 MG tablet Take 1 tablet (10 mg total) by mouth every 6 (six) hours as needed. Patient not taking: Reported on 06/20/2017 04/20/17   Versie Starks, PA-C  metoCLOPramide (REGLAN) 10 MG tablet Take 1 tablet (10 mg total) by mouth every 8 (eight) hours as needed for up to 3 days for nausea. 06/05/17 06/08/17  Rudene Re, MD  ondansetron (ZOFRAN-ODT) 4 MG disintegrating tablet Take 4 mg by mouth every 8 (eight) hours as needed for nausea or vomiting.    [provider]  promethazine (PHENERGAN) 25 MG tablet Take 25  mg by mouth every 8 (eight) hours as needed for nausea or vomiting.    [provider]  rizatriptan (MAXALT) 10 MG tablet Take 1 tab at migraine onset. May repeat in 2 hours prn.  Limit to 2 in 24 hours and to 2 days weekly. 09/09/15   [provider]  Spacer/Aero-Holding Chambers (AEROCHAMBER PLUS WITH MASK) inhaler Use as instructed 12/12/16   Carrie Mew, MD  traMADol (ULTRAM) 50 MG tablet Take 1 tablet (50 mg total) by mouth every 6 (six) hours as needed. 04/20/17 04/20/18  Versie Starks, PA-C      VITAL SIGNS:  Blood pressure 129/87, pulse 100, temperature 98.6 F (37 C), resp. rate 20, height 5\' 6"  (1.676 m), weight 109.8 kg (242 lb), SpO2 96 %.  PHYSICAL EXAMINATION:  Physical Exam  GENERAL:  52 y.o.-year-old patient lying in the bed in NAD.  EYES: Pupils equal, round, reactive to light and accommodation. No scleral icterus.  Extraocular muscles intact.  HEENT: Head atraumatic, normocephalic. Oropharynx and nasopharynx clear. No oropharyngeal erythema, moist oral mucosa  NECK:  Supple, no jugular venous distention. No thyroid enlargement, no tenderness.  LUNGS: Normal breath sounds bilaterally, no wheezing, rales, rhonchi. No use of accessory muscles of respiration.  CARDIOVASCULAR: S1, S2 RRR. No murmurs, rubs, gallops, clicks.  ABDOMEN: Soft, nontender, nondistended. Bowel sounds present. No organomegaly or mass.  EXTREMITIES: No pedal edema, cyanosis, or clubbing. + 2 pedal & radial pulses b/l.   NEUROLOGIC: Cranial nerves II through XII are intact. No focal Motor or sensory deficits appreciated b/l.  PSYCHIATRIC: The patient is alert and oriented x 3.  SKIN: No obvious rash, lesion, or ulcer.   LABORATORY PANEL:   CBC Recent Labs  Lab 06/20/17 1514  WBC 11.0  HGB 11.3*  HCT 36.2  PLT 364   ------------------------------------------------------------------------------------------------------------------  Chemistries  Recent Labs  Lab 06/20/17 1514  NA 137  K 3.9  CL 104  CO2 23  GLUCOSE 175*  BUN 12  CREATININE 0.82  CALCIUM 9.2  AST 22  ALT 30  ALKPHOS 108  BILITOT 0.6   ------------------------------------------------------------------------------------------------------------------  Cardiac Enzymes Recent Labs  Lab 06/20/17 1514  TROPONINI <0.03   ------------------------------------------------------------------------------------------------------------------  RADIOLOGY:  Ct Head Code Stroke Wo Contrast  Result Date: 06/20/2017 CLINICAL DATA:  Code stroke. LEFT-sided weakness and difficulty swallowing which began 1 hour ago. EXAM: CT HEAD WITHOUT CONTRAST TECHNIQUE: Contiguous axial images were obtained from the base of the skull through the vertex without intravenous contrast. COMPARISON:  06/05/2017. FINDINGS: Brain: No evidence of acute infarction, hemorrhage, hydrocephalus,  extra-axial collection or mass lesion/mass effect. Vascular: Calcification of the cavernous internal carotid arteries consistent with cerebrovascular atherosclerotic disease. No signs of intracranial large vessel occlusion. Skull: Intact. Sinuses/Orbits: Negative Other: Negative ASPECTS ([place] Stroke Program Early CT Score) - Ganglionic level infarction (caudate, lentiform nuclei, internal capsule, insula, M1-M3 cortex): 7 - Supraganglionic infarction (M4-M6 cortex): 3 Total score (0-10 with 10 being normal): 10 IMPRESSION: 1. Negative and stable exam. 2. ASPECTS is 10. 3. These results were called by telephone at the time of interpretation on 06/20/2017 at 3:17 pm to Dr. Lenise Arena , who verbally acknowledged these results. Electronically Signed   By: Staci Righter M.D.   On: 06/20/2017 15:19     IMPRESSION AND PLAN:   52 year old female with past medical history of chronic pain, fibromyalgia,, anxiety/depression, history of prothrombin gene mutation with chronic DVT, history of previous TIA, hypothyroidism, history of migraines who presents to the hospital due  to slurred speech, left-sided numbness.  1. CVA/TIA-this is a working diagnosis given patient's slurred speech and left-sided numbness. Patient's initial CT head in the ER is negative for acute pathology. -Patient cannot undergo an MRI she has a spinal stimulator. I will get a carotid duplex, echocardiogram - cont. Pradaxa,  I'll get a neurology consult.  2. Diabetes type 2 without complication-continue Januvia, metformin.  3. Essential hypertension-continue propranolol, quinapril, verapamil.  4. Hyperlipidemia-continue atorvastatin.  5. Anxiety/depression-continue Wellbutrin, Remeron, Cymbalta, Klonopin.    6. Chronic pain-continue Nucynta.  7. Hypothyroidism-continue Synthroid.  8. History of prothrombin gene mutation with chronic DVT-continue Pradaxa.  9. GERD-continue Protonix, Pepcid.  All the records are reviewed and  case discussed with ED provider. Management plans discussed with the patient, family and they are in agreement.  CODE STATUS: Full code  TOTAL TIME TAKING CARE OF THIS PATIENT: 40 minutes.    Henreitta Leber M.D on 06/20/2017 at 6:41 PM  Between 7am to 6pm - Pager - [phone]  After 6pm go to [email or web address] - password [email or web address] [facility]  Office  [phone]  CC: Primary care physician; Zhou-Talbert, Elwyn Lade, MD

## 2017-06-20 NOTE — Consult Note (Signed)
   [facility] TeleNeurology Consult Services  Date of Service:   Impression:  1. TIA vs. small vessel ischemic stroke possibly in the brainstem given combination of dysarthria and dysphagia 2. 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    Not a tPA candidate due to: anticoagulated with Pradaxa, reported history of cerebral blood clots Not clearly an NIR candidate due to: no evidence of large vessel occlusion  Differential Diagnosis:   1. Cardioembolic stroke  2. Small vessel disease/lacune  3. Thromboembolic, artery-to-artery mechanism  4. Hypercoagulable state-related infarct  5. Transient ischemic attack  6. Thrombotic mechanism, large artery disease    Comments:   [facility] contacted:  1523 [facility] at bedside: 1531 NIHSS assessment time: 1547  Recommendations:   Inpatient Neurology consultation Stroke evaluation per Neurology/Internal Medicine; try to obtain old records from [place] Discussed with ED MD Please call with questions.  ----------------------------------------------------------------------------------------- CC: slurred speech and left-sided symptoms  History of Present Illness: this is a 52 year old woman who at 1400 today develop left-sided numbness tingling and weakness, slurred speech, difficulty swallowing. She had a similar episode three weeks ago was worked up at [employer]. This lasted five hours and disappeared. She has a history of a genetic clotting disorder but does not know which one. She has a history of hypertension. She is diabetic. She has coronary disease. He is hyperlipidemia. She does not have atrial fibrillation and she's a non-smoker.   Diagnostic: CT brain shows no acute changes to suggest acute ischemic stroke, hemorrhage, or mass lesion. This is despite her saying that she has had blood clots in her brain in the past.  Exam  Level of consciousness: alert, keenly  responsive = 0 Level of consciousness: month and age: both questions correct = 0 Level of consciousness: commands: performs both tasks = 0 Horizontal EOMs: normal = 0 Visual fields: normal = 0 Test facial palsy: normal symmetry = 0 L arm motor drift: no drift for 10 seconds = 0 R arm motor drift: no drift for 10 seconds = 0 L leg motor drift: no drift for five seconds = 1 R leg motor drift: no drift five seconds = 0 Limb ataxia: (FNF/Heel-Shin): no ataxia = 0 Sensation: normal; no sensory loss = 0 Language/Aphasia: normal: no aphasia = 0 Dysarthria: normal = 0 Extinction/Inattention = 0  NIHSS = 1   Medical Decision Making:  - Extensive number of diagnosis or management options are considered above.   - Extensive amount of complex data reviewed.   - High risk of complication and/or morbidity or mortality are associated with differential diagnostic considerations above.  - There may be Uncertain outcome and increased probability of prolonged functional impairment or high probability of severe prolonged functional impairment associated with some of these differential diagnosis.   Medical Data Reviewed:  1.Data reviewed include clinical labs, radiology,  Medical Tests;   2.Tests results discussed w/performing or interpreting physician;   3.Obtaining/reviewing old medical records;  4.Obtaining case history from another source;  5.Independent review of image, tracing or specimen.    Patient was informed the Neurology Consult would happen via telehealth (remote video) and consented to receiving care in this manner.

## 2017-06-20 NOTE — ED Notes (Signed)
Teleneuro on screen 

## 2017-06-20 NOTE — ED Notes (Signed)
CODE STROKE CALLED TO [phone] 

## 2017-06-20 NOTE — ED Notes (Signed)
Pt was given a sandwith tray and a diet beverage.

## 2017-06-20 NOTE — ED Triage Notes (Signed)
PAtient reports around 2pm she started having trouble swallowing, burning in throat, and burning in her L arm. Recent stroke January 4th. Speech clear.

## 2017-06-20 NOTE — Progress Notes (Signed)
Chaplain was paged to take over for on call chaplain code stroke page. Chaplain assisted on trying to contact family. Pt gave nurse phone to call. Person was not family but [place] let them know she was in the ED. Chaplain informed nurse of conversation and told them to call on call if family comes.    06/20/17 1520  Clinical Encounter Type  Visited With Patient  Visit Type Initial;Spiritual support  Referral From Chaplain  Spiritual Encounters  Spiritual Needs Emotional  Stress Factors  Patient Stress Factors Health changes  Advance Directives (For Healthcare)  Does Patient Have a Medical Advance Directive? No  Would patient like information on creating a medical advance directive? No - Patient declined  [place]  Does Patient Have a Mental Health Advance Directive? No  Would patient like information on creating a mental health advance directive? No - Patient declined

## 2017-06-21 ENCOUNTER — Observation Stay: Payer: Medicare Other

## 2017-06-21 ENCOUNTER — Observation Stay (HOSPITAL_BASED_OUTPATIENT_CLINIC_OR_DEPARTMENT_OTHER)
Admit: 2017-06-21 | Discharge: 2017-06-21 | Disposition: A | Discharge status: Home / Self Care | Payer: Medicare Other | Attending: Specialist | Admitting: Specialist

## 2017-06-21 DIAGNOSIS — G459 Transient cerebral ischemic attack, unspecified: Principal | ICD-10-CM

## 2017-06-21 LAB — LIPID PANEL
Cholesterol: 277 mg/dL — ABNORMAL HIGH (ref 0–200)
HDL: 64 mg/dL (ref >40)
LDL CALC: 174 mg/dL — AB (ref 0–99)
Total CHOL/HDL Ratio: 4.3 RATIO
Triglycerides: 196 mg/dL — ABNORMAL HIGH (ref <150)
VLDL: 39 mg/dL (ref 0–40)

## 2017-06-21 LAB — HEMOGLOBIN A1C
HEMOGLOBIN A1C: 9.9 % — AB (ref 4.8–5.6)
Mean Plasma Glucose: 237.43 mg/dL

## 2017-06-21 LAB — ECHOCARDIOGRAM COMPLETE
HEIGHTINCHES: 66 in
Weight: 3872 oz

## 2017-06-21 LAB — GLUCOSE, CAPILLARY
GLUCOSE-CAPILLARY: 133 mg/dL — AB (ref 65–99)
GLUCOSE-CAPILLARY: 139 mg/dL — AB (ref 65–99)
Glucose-Capillary: 165 mg/dL — ABNORMAL HIGH (ref 65–99)

## 2017-06-21 MED ORDER — INSULIN ASPART 100 UNIT/ML ~~LOC~~ SOLN
0.0000 [IU] | Freq: Three times a day (TID) | SUBCUTANEOUS | Status: DC
  Administered 2017-06-21: 1 [IU] via SUBCUTANEOUS
  Administered 2017-06-21 – 2017-06-22 (×2): 2 [IU] via SUBCUTANEOUS
  Administered 2017-06-22: 12:00:00 5 [IU] via SUBCUTANEOUS
  Filled 2017-06-21 (×4): qty 1

## 2017-06-21 MED ORDER — INSULIN ASPART 100 UNIT/ML ~~LOC~~ SOLN: 0.0000 [IU] | Freq: Every day | SUBCUTANEOUS | Status: DC

## 2017-06-21 NOTE — Care Management Obs Status (Signed)
[place] NOTIFICATION   Patient Details  Name: Christine Mcneil MRN: [id number] Date of Birth: 25-Jul-1965   Medicare Observation Status Notification Given:  Yes    Marshell Garfinkel, RN 06/21/2017, 1:38 PM

## 2017-06-21 NOTE — Progress Notes (Signed)
[facility] [place] NAME: Christine Mcneil    MR#:  [id number]  DATE OF BIRTH:  04/24/1966  SUBJECTIVE:  CHIEF COMPLAINT: Patient slurred speech is getting better, still reporting some weakness and burning sensation in the left upper and lower extremities.  Denies any headache.  Tolerating diet.  Passed bedside swallow evaluation.  REVIEW OF SYSTEMS:  CONSTITUTIONAL: No fever, fatigue or weakness.  EYES: No blurred or double vision.  EARS, NOSE, AND THROAT: No tinnitus or ear pain.  RESPIRATORY: No cough, shortness of breath, wheezing or hemoptysis.  CARDIOVASCULAR: No chest pain, orthopnea, edema.  GASTROINTESTINAL: No nausea, vomiting, diarrhea or abdominal pain.  GENITOURINARY: No dysuria, hematuria.  ENDOCRINE: No polyuria, nocturia,  HEMATOLOGY: No anemia, easy bruising or bleeding SKIN: No rash or lesion. MUSCULOSKELETAL: No joint pain or arthritis.   NEUROLOGIC: No tingling, numbness.  Report improving left leg and hand weakness PSYCHIATRY: No anxiety or depression.   DRUG ALLERGIES:   Allergies  Allergen Reactions  . Amoxicillin Itching  . Keflex [Cephalexin] Itching    VITALS:  Blood pressure 118/78, pulse 90, temperature 98.6 F (37 C), temperature source Oral, resp. rate 18, height 5\' 6"  (1.676 m), weight 109.8 kg (242 lb), SpO2 96 %.  PHYSICAL EXAMINATION:  GENERAL:  52 y.o.-year-old patient lying in the bed with no acute distress.  EYES: Pupils equal, round, reactive to light and accommodation. No scleral icterus. Extraocular muscles intact.  HEENT: Head atraumatic, normocephalic. Oropharynx and nasopharynx clear.  NECK:  Supple, no jugular venous distention. No thyroid enlargement, no tenderness.  LUNGS: Normal breath sounds bilaterally, no wheezing, rales,rhonchi or crepitation. No use of accessory muscles of respiration.  CARDIOVASCULAR: S1, S2 normal. No murmurs, rubs, or gallops.  ABDOMEN: Soft, nontender,  nondistended. Bowel sounds present. No organomegaly or mass.  EXTREMITIES: No pedal edema, cyanosis, or clubbing.  NEUROLOGIC: Cranial nerves II through XII are intact. Muscle strength 5/5 in  right upper and lower extremity and 4 out of 5 in left upper and lower extremity  has dysarthria. Sensation intact. Gait not checked.  PSYCHIATRIC: The patient is alert and oriented x 3.  SKIN: No obvious rash, lesion, or ulcer.    LABORATORY PANEL:   CBC Recent Labs  Lab 06/20/17 1514  WBC 11.0  HGB 11.3*  HCT 36.2  PLT 364   ------------------------------------------------------------------------------------------------------------------  Chemistries  Recent Labs  Lab 06/20/17 1514  NA 137  K 3.9  CL 104  CO2 23  GLUCOSE 175*  BUN 12  CREATININE 0.82  CALCIUM 9.2  AST 22  ALT 30  ALKPHOS 108  BILITOT 0.6   ------------------------------------------------------------------------------------------------------------------  Cardiac Enzymes Recent Labs  Lab 06/20/17 1514  TROPONINI <0.03   ------------------------------------------------------------------------------------------------------------------  RADIOLOGY:  US Carotid Bilateral  Result Date: 06/21/2017 CLINICAL DATA:  Stroke symptoms, hypertension, hyperlipidemia EXAM: BILATERAL CAROTID DUPLEX ULTRASOUND TECHNIQUE: Pearline Cables scale imaging, color Doppler and duplex ultrasound were performed of bilateral carotid and vertebral arteries in the neck. COMPARISON:  None. FINDINGS: Criteria: Quantification of carotid stenosis is based on velocity parameters that correlate the residual internal carotid diameter with NASCET-based stenosis levels, using the diameter of the distal internal carotid lumen as the denominator for stenosis measurement. The following velocity measurements were obtained: RIGHT ICA:  105/47 cm/sec CCA:  86/76 cm/sec SYSTOLIC ICA/CCA RATIO:  1.5 DIASTOLIC ICA/CCA RATIO:  2.3 ECA:  80 cm/sec LEFT ICA:  102/49 cm/sec  CCA:  19/50 cm/sec SYSTOLIC ICA/CCA RATIO:  1.2 DIASTOLIC ICA/CCA RATIO:  2.2 ECA:  76 cm/sec RIGHT CAROTID ARTERY: Minor echogenic shadowing plaque formation. No hemodynamically significant right ICA stenosis, velocity elevation, or turbulent flow. Degree of narrowing less than 50%. RIGHT VERTEBRAL ARTERY:  Antegrade LEFT CAROTID ARTERY: Similar scattered minor echogenic plaque formation. No hemodynamically significant left ICA stenosis, velocity elevation, or turbulent flow. LEFT VERTEBRAL ARTERY:  Antegrade IMPRESSION: Minor carotid atherosclerosis. No hemodynamically significant ICA stenosis. Degree of narrowing less than 50% bilaterally. Patent antegrade vertebral flow bilaterally Electronically Signed   By: Jerilynn Mages.  Shick M.D.   On: 06/21/2017 11:22   Ct Head Code Stroke Wo Contrast  Result Date: 06/20/2017 CLINICAL DATA:  Code stroke. LEFT-sided weakness and difficulty swallowing which began 1 hour ago. EXAM: CT HEAD WITHOUT CONTRAST TECHNIQUE: Contiguous axial images were obtained from the base of the skull through the vertex without intravenous contrast. COMPARISON:  06/05/2017. FINDINGS: Brain: No evidence of acute infarction, hemorrhage, hydrocephalus, extra-axial collection or mass lesion/mass effect. Vascular: Calcification of the cavernous internal carotid arteries consistent with cerebrovascular atherosclerotic disease. No signs of intracranial large vessel occlusion. Skull: Intact. Sinuses/Orbits: Negative Other: Negative ASPECTS ([place] Stroke Program Early CT Score) - Ganglionic level infarction (caudate, lentiform nuclei, internal capsule, insula, M1-M3 cortex): 7 - Supraganglionic infarction (M4-M6 cortex): 3 Total score (0-10 with 10 being normal): 10 IMPRESSION: 1. Negative and stable exam. 2. ASPECTS is 10. 3. These results were called by telephone at the time of interpretation on 06/20/2017 at 3:17 pm to Dr. Lenise Arena , who verbally acknowledged these results. Electronically Signed    By: Staci Righter M.D.   On: 06/20/2017 15:19    EKG:   Orders placed or performed during the hospital encounter of 06/20/17  . ED EKG  . ED EKG  . EKG 12-Lead  . EKG 12-Lead    ASSESSMENT AND PLAN:    52 year old female with past medical history of chronic pain, fibromyalgia,, anxiety/depression, history of prothrombin gene mutation with chronic DVT, history of previous TIA, hypothyroidism, history of migraines who presents to the hospital due to slurred speech, left-sided numbness.  1. CVA/TIA-  patient's slurred speech and left-sided numbness are improving  patient's initial CT head in the ER is negative for acute pathology. -Patient cannot undergo an MRI she has a spinal stimulator.   carotid duplex with no significant stenosis  Echocardiogram- ejection fraction 60-65% - cont. Pradaxa  neurology is following -LDL 174 -Hemoglobin A1c is pending  Physical therapy is recommending home health PT  2. Diabetes type 2 without complication-continue Januvia, metformin.  3. Essential hypertension-continue propranolol, quinapril, verapamil.  4. Hyperlipidemia-continue atorvastatin.  5. Anxiety/depression-continue Wellbutrin, Remeron, Cymbalta, Klonopin.    6. Chronic pain-continue Nucynta.  7. Hypothyroidism-continue Synthroid.  8. History of prothrombin gene mutation with chronic DVT-continue Pradaxa.  9. GERD-continue Protonix, Pepcid.      All the records are reviewed and case discussed with Care Management/Social Workerr. Management plans discussed with the patient, family and they are in agreement.  CODE STATUS:  fc   TOTAL TIME TAKING CARE OF THIS PATIENT:  36  minutes.   POSSIBLE D/C IN  1  DAYS, DEPENDING ON CLINICAL CONDITION.  Note: This dictation was prepared with Dragon dictation along with smaller phrase technology. Any transcriptional errors that result from this process are unintentional.   Nicholes Mango M.D on 06/21/2017 at 4:42  PM  Between 7am to 6pm - Pager - [phone] After 6pm go to [email or web address] - password [email or web address] [facility]  [facility]  Office  [phone]  CC:  Primary care physician; Zhou-Talbert, Elwyn Lade, MD

## 2017-06-21 NOTE — Consult Note (Signed)
Referring Physician: Gouru    Chief Complaint: Left sided numbness  HPI: Christine Mcneil is an 52 y.o. female with a history of DM, HTN and TIA who reports waking yesterday at baseline.  Went to the doctor and when she returned home while eating lunch noted that she was having difficulty swallowing.  Patient later noted that her left arm became numb and started to burn.  The sensation went up her arm, into the left side of her face and then into her left leg.  Noted slurred speech as well.  She came to the ED where she also noted that she was unable to control her LLE.  Symptoms have improved today.   Initial NIHSS of 0.    Date last known well: Date: 06/20/2017 Time last known well: Time: 13:00 tPA Given: No: On anticoagulation  Past Medical History:  Diagnosis Date  . Anxiety and depression   . Chronic pain   . Diabetes mellitus without complication ([place])   . Hypertension   . Migraines   . Prothrombin gene mutation ([place])   . Thyroid disease   . TIA (transient ischemic attack) 05/27/2017    Past Surgical History:  Procedure Laterality Date  . ABDOMINAL HYSTERECTOMY    . CHOLECYSTECTOMY    . ROTATOR CUFF REPAIR Right     Family History  Problem Relation Age of Onset  . Diabetes Mother   . Hyperlipidemia Mother   . Hypertension Mother   . COPD Mother   . CVA Mother   . Diabetes Father   . Hyperlipidemia Father   . Hypertension Father   . Thyroid disease Father    Social History:  reports that  has never smoked. she has never used smokeless tobacco. She reports that she does not drink alcohol or use drugs.  Allergies:  Allergies  Allergen Reactions  . Amoxicillin Itching  . Keflex [Cephalexin] Itching    Medications:  I have reviewed the patient's current medications. Prior to Admission:  Medications Prior to Admission  Medication Sig Dispense Refill Last Dose  . acetaZOLAMIDE (DIAMOX) 500 MG capsule Take 500 mg by mouth daily.  3 06/20/2017 at Unknown time  .  atorvastatin (LIPITOR) 40 MG tablet Take 40 mg by mouth daily.   06/20/2017 at Unknown time  . buPROPion (WELLBUTRIN SR) 150 MG 12 hr tablet Take 300 mg by mouth daily.   06/20/2017 at Unknown time  . Cholecalciferol (VITAMIN D3) 1000 units CAPS Take 1 capsule by mouth daily.   06/20/2017 at Unknown time  . dabigatran (PRADAXA) 150 MG CAPS capsule Take 150 mg by mouth 2 (two) times daily.   06/20/2017 at Unknown time  . DULoxetine (CYMBALTA) 60 MG capsule Take 60 mg by mouth 2 (two) times daily.   06/20/2017 at Unknown time  . esomeprazole (NEXIUM) 40 MG capsule Take 40 mg by mouth daily.   06/20/2017 at Unknown time  . glipiZIDE (GLUCOTROL) 5 MG tablet Take 1 tablet by mouth daily.   06/20/2017 at Unknown time  . hydrOXYzine (ATARAX/VISTARIL) 50 MG tablet Take 50 mg by mouth 3 (three) times daily as needed.    06/20/2017 at Unknown time  . lamoTRIgine (LAMICTAL) 100 MG tablet Take 1 tablet by mouth daily.   06/20/2017 at Unknown time  . levothyroxine (SYNTHROID, LEVOTHROID) 100 MCG tablet Take 100 mcg by mouth daily.   06/20/2017 at Unknown time  . loratadine (CLARITIN) 10 MG tablet Take 10 mg by mouth daily as needed for allergies.  06/20/2017 at Unknown time  . MAGNESIUM PO Take 500 mg by mouth 2 (two) times daily.   06/20/2017 at Unknown time  . metFORMIN (GLUCOPHAGE) 500 MG tablet Take 500 mg by mouth 2 (two) times daily.   06/20/2017 at Unknown time  . mirtazapine (REMERON) 15 MG tablet Take 1 tablet by mouth at bedtime.   06/19/2017 at Unknown time  . NUCYNTA ER 100 MG 12 hr tablet Take 1 tablet by mouth 2 (two) times daily.   06/20/2017 at Unknown time  . propranolol (INDERAL) 10 MG tablet Take 1 tablet by mouth 3 (three) times daily.   06/20/2017 at Unknown time  . quinapril (ACCUPRIL) 10 MG tablet Take 10 mg by mouth daily.   06/20/2017 at Unknown time  . ranitidine (ZANTAC) 150 MG tablet Take 150 mg by mouth 2 (two) times daily.   06/20/2017 at Unknown time  . sitaGLIPtin (JANUVIA) 100 MG tablet Take 100  mg by mouth daily.   06/20/2017 at Unknown time  . verapamil (CALAN-SR) 240 MG CR tablet Take 1 tablet by mouth daily.   06/20/2017 at Unknown time  . zolpidem (AMBIEN) 10 MG tablet Take 10 mg by mouth daily.   06/19/2017 at Unknown time  . AIMOVIG 70 MG/ML SOAJ Inject 70 mg into the skin every 28 (twenty-eight) days.     Marland Kitchen albuterol (PROVENTIL HFA) 108 (90 Base) MCG/ACT inhaler Inhale 2 puffs into the lungs every 4 (four) hours as needed for wheezing or shortness of breath. 1 Inhaler 0 PRN at PRN  . aspirin-acetaminophen-caffeine (EXCEDRIN MIGRAINE) 250-250-65 MG tablet Take 2 tablets by mouth every 8 (eight) hours as needed for headache or migraine.    PRN at PRN  . clonazePAM (KLONOPIN) 1 MG tablet Take 1 mg by mouth daily as needed.   PRN at PRN  . ketorolac (TORADOL) 10 MG tablet Take 1 tablet (10 mg total) by mouth every 6 (six) hours as needed. (Patient not taking: Reported on 06/20/2017) 20 tablet 0 Not Taking at Unknown time  . metoCLOPramide (REGLAN) 10 MG tablet Take 1 tablet (10 mg total) by mouth every 8 (eight) hours as needed for up to 3 days for nausea. 20 tablet 0   . ondansetron (ZOFRAN-ODT) 4 MG disintegrating tablet Take 4 mg by mouth every 8 (eight) hours as needed for nausea or vomiting.   PRN at PRN  . promethazine (PHENERGAN) 25 MG tablet Take 25 mg by mouth every 8 (eight) hours as needed for nausea or vomiting.   PRN at PRN  . rizatriptan (MAXALT) 10 MG tablet Take 1 tab at migraine onset. May repeat in 2 hours prn.  Limit to 2 in 24 hours and to 2 days weekly.   PRN at PRN  . Spacer/Aero-Holding Chambers (AEROCHAMBER PLUS WITH MASK) inhaler Use as instructed 1 each 2   . traMADol (ULTRAM) 50 MG tablet Take 1 tablet (50 mg total) by mouth every 6 (six) hours as needed. 20 tablet 0 PRN at PRN   Scheduled: . acetaZOLAMIDE  500 mg Oral Daily  . atorvastatin  40 mg Oral Daily  . buPROPion  300 mg Oral Daily  . cholecalciferol  1,000 Units Oral Daily  . dabigatran  150 mg Oral  BID  . DULoxetine  60 mg Oral BID  . famotidine  20 mg Oral BID  . glipiZIDE  5 mg Oral Daily  . lamoTRIgine  100 mg Oral Daily  . levothyroxine  100 mcg Oral QAC breakfast  .  linagliptin  5 mg Oral Daily  . lisinopril  10 mg Oral Daily  . metFORMIN  500 mg Oral BID WC  . mirtazapine  15 mg Oral QHS  . pantoprazole  40 mg Oral Daily  . pneumococcal 23 valent vaccine  0.5 mL Intramuscular Tomorrow-1000  . propranolol  10 mg Oral TID  . tapentadol  100 mg Oral BID  . verapamil  240 mg Oral Daily    ROS: History obtained from the patient  General ROS: negative for - chills, fatigue, fever, night sweats, weight gain or weight loss Psychological ROS: negative for - behavioral disorder, hallucinations, memory difficulties, mood swings or suicidal ideation Ophthalmic ROS: negative for - blurry vision, double vision, eye pain or loss of vision ENT ROS: negative for - epistaxis, nasal discharge, oral lesions, sore throat, tinnitus or vertigo Allergy and Immunology ROS: negative for - hives or itchy/watery eyes Hematological and Lymphatic ROS: negative for - bleeding problems, bruising or swollen lymph nodes Endocrine ROS: negative for - galactorrhea, hair pattern changes, polydipsia/polyuria or temperature intolerance Respiratory ROS: negative for - cough, hemoptysis, shortness of breath or wheezing Cardiovascular ROS: negative for - chest pain, dyspnea on exertion, edema or irregular heartbeat Gastrointestinal ROS:  nausea Genito-Urinary ROS: negative for - dysuria, hematuria, incontinence or urinary frequency/urgency Musculoskeletal ROS: back pain Neurological ROS: as noted in HPI, headache Dermatological ROS: negative for rash and skin lesion changes  Physical Examination: Blood pressure 126/82, pulse 97, temperature 98.6 F (37 C), temperature source Oral, resp. rate 18, height 5\' 6"  (1.676 m), weight 109.8 kg (242 lb), SpO2 94 %.  HEENT-  Normocephalic, no lesions, without obvious  abnormality.  Normal external eye and conjunctiva.  Normal TM's bilaterally.  Normal auditory canals and external ears. Normal external nose, mucus membranes and septum.  Normal pharynx. Cardiovascular- S1, S2 normal, pulses palpable throughout   Lungs- chest clear, no wheezing, rales, normal symmetric air entry Abdomen- soft, non-tender; bowel sounds normal; no masses,  no organomegaly Extremities- mild BLE edema Lymph-no adenopathy palpable Musculoskeletal-no joint tenderness, deformity or swelling Skin-warm and dry, no hyperpigmentation, vitiligo, or suspicious lesions  Neurological Examination   Mental Status: Alert, oriented, thought content appropriate.  Speech fluent without evidence of aphasia.  Able to follow 3 step commands without difficulty. Cranial Nerves: II: Discs flat bilaterally; Visual fields grossly normal, pupils equal, round, reactive to light and accommodation III,IV, VI: mild left ptosis, extra-ocular motions intact bilaterally V,VII: smile symmetric, facial light touch sensation normal bilaterally VIII: hearing normal bilaterally IX,X: gag reflex present XI: bilateral shoulder shrug XII: midline tongue extension Motor: Right : Upper extremity   5/5    Left:     Upper extremity   5/5  Lower extremity   5/5     Lower extremity   5/5 Tone and bulk:normal tone throughout; no atrophy noted Sensory: Pinprick and light touch intact throughout, bilaterally Deep Tendon Reflexes: 2+ and symmetric with absent AJ's bilaterally Plantars: Right: downgoing   Left: downgoing Cerebellar: Normal finger-to-nose and normal heel-to-shin testing bilaterally Gait: not tested due to safety concerns    Laboratory Studies:  Basic Metabolic Panel: Recent Labs  Lab 06/20/17 1514  NA 137  K 3.9  CL 104  CO2 23  GLUCOSE 175*  BUN 12  CREATININE 0.82  CALCIUM 9.2    Liver Function Tests: Recent Labs  Lab 06/20/17 1514  AST 22  ALT 30  ALKPHOS 108  BILITOT 0.6  PROT  8.0  ALBUMIN 4.4  No results for input(s): LIPASE, AMYLASE in the last 168 hours. No results for input(s): AMMONIA in the last 168 hours.  CBC: Recent Labs  Lab 06/20/17 1514  WBC 11.0  NEUTROABS 6.0  HGB 11.3*  HCT 36.2  MCV 73.7*  PLT 364    Cardiac Enzymes: Recent Labs  Lab 06/20/17 1514  TROPONINI <0.03    BNP: Invalid input(s): POCBNP  CBG: Recent Labs  Lab 06/20/17 1521  GLUCAP 156*    Microbiology: Results for orders placed or performed during the hospital encounter of 06/05/17  Urine Culture     Status: Abnormal   Collection Time: 06/05/17  5:13 PM  Result Value Ref Range Status   Specimen Description   Final    URINE, RANDOM Performed at [facility], [address] [place], [place] [address]    Special Requests   Final    NONE Performed at [facility], [address] [place], [place] [address]    Culture MULTIPLE SPECIES PRESENT, SUGGEST RECOLLECTION (A)  Final   Report Status 06/07/2017 FINAL  Final    Coagulation Studies: Recent Labs    06/20/17 1514  LABPROT 15.8*  INR 1.27    Urinalysis: No results for input(s): COLORURINE, LABSPEC, PHURINE, GLUCOSEU, HGBUR, BILIRUBINUR, KETONESUR, PROTEINUR, UROBILINOGEN, NITRITE, LEUKOCYTESUR in the last 168 hours.  Invalid input(s): APPERANCEUR  Lipid Panel: No results found for: CHOL, TRIG, HDL, CHOLHDL, VLDL, LDLCALC  HgbA1C: No results found for: HGBA1C  Urine Drug Screen:  No results found for: LABOPIA, COCAINSCRNUR, LABBENZ, AMPHETMU, THCU, LABBARB  Alcohol Level: No results for input(s): ETH in the last 168 hours.  Other results: EKG: sinus tachycardia at 103 bpm.  Imaging: Ct Head Code Stroke Wo Contrast  Result Date: 06/20/2017 CLINICAL DATA:  Code stroke. LEFT-sided weakness and difficulty swallowing which began 1 hour ago. EXAM: CT HEAD WITHOUT CONTRAST TECHNIQUE: Contiguous axial images were obtained from the base of the skull through the vertex without  intravenous contrast. COMPARISON:  06/05/2017. FINDINGS: Brain: No evidence of acute infarction, hemorrhage, hydrocephalus, extra-axial collection or mass lesion/mass effect. Vascular: Calcification of the cavernous internal carotid arteries consistent with cerebrovascular atherosclerotic disease. No signs of intracranial large vessel occlusion. Skull: Intact. Sinuses/Orbits: Negative Other: Negative ASPECTS ([place] Stroke Program Early CT Score) - Ganglionic level infarction (caudate, lentiform nuclei, internal capsule, insula, M1-M3 cortex): 7 - Supraganglionic infarction (M4-M6 cortex): 3 Total score (0-10 with 10 being normal): 10 IMPRESSION: 1. Negative and stable exam. 2. ASPECTS is 10. 3. These results were called by telephone at the time of interpretation on 06/20/2017 at 3:17 pm to Dr. Lenise Arena , who verbally acknowledged these results. Electronically Signed   By: Staci Righter M.D.   On: 06/20/2017 15:19    Assessment: 52 y.o. female presenting with complaints of left sided numbness, slurred speech and difficulty swallowing. Symptoms have improved.  Head CT reviewed and shows no acute changes. MRI unable to be performed due to the patient having a stimulator.  Can not rule out TIA versus small acute infarct.  Patient on Pradaxa.  Carotid dopplers show no evidence of hemodynamically significant stenosis.  Echocardiogram shows an EF of 60-65%.  A1c pending, LDL 174.  Stroke Risk Factors - diabetes mellitus, hypercoagulable state and hypertension  Plan: 1. PT consult, OT consult, Speech consult 2. Prophylactic therapy-Continue Pradaxa 3. Telemetry monitoring 4. Frequent neuro checks 5. Aggressive lipid management with target LDL<70.   Alexis Goodell, MD Neurology [phone] 06/21/2017, 10:42 AM

## 2017-06-21 NOTE — Progress Notes (Signed)
*  PRELIMINARY RESULTS* Echocardiogram 2D Echocardiogram has been performed.  Sherrie Sport 06/21/2017, 9:53 AM

## 2017-06-21 NOTE — Evaluation (Signed)
Physical Therapy Evaluation Patient Details Name: Christine Mcneil MRN: [id number] DOB: Nov 21, 1965 Today's Date: 06/21/2017   History of Present Illness  52 y/o female who is here for TIA, had L side issues with swallowing difficulty.  L UE symptoms have nearly resolved (subjectively "a little weak") but she is still having L LE coordination, sensation and weakness issues.  CT (-) for acute stroke.  Clinical Impression  Pt did well enough to go home from a PT perspective, but is weaker than her baseline, quickly to fatigue with minimal ambulation and displayed coordination and sensation issues effecting L LE.  She lacked AROM left TKE in WBing, nearly buckling on many steps and ultimately he is not nearly as independent/confident as baseline.  Discussed that if doable she would get more benefit from what is available with outpatient PT but she does not feel confident in her ability/safety out and about right now.  PT tends to agree, encouraged her to use her cane until PT has cleared her to do more.     Follow Up Recommendations Home health PT;Outpatient PT(per progress, would need to tolerate longer walking for OPT)    Equipment Recommendations  (has cane, PT encouraged her to use it)    Recommendations for Other Services       Precautions / Restrictions Precautions Precautions: (moderate fall risk) Restrictions Weight Bearing Restrictions: No      Mobility  Bed Mobility Overal bed mobility: Modified Independent             General bed mobility comments: Pt able to get herself up to EOB w/o issue  Transfers Overall transfer level: Modified independent Equipment used: None             General transfer comment: Pt able to get to standing w/o assist, but was more reliant on UEs than expected and she showed decreased confidence with L LE WBing.  Ambulation/Gait Ambulation/Gait assistance: Min guard Ambulation Distance (Feet): 60 Feet Assistive device: None        General Gait Details: Pt with decreased coordination with L foot placement, knee lacked TKE despite cuing and she did appear to have a few small/partial episodes of buckling with decreased confidence.  Pt also quickly fatigued with the effort.  Stairs            Wheelchair Mobility    Modified Rankin (Stroke Patients Only)       Balance Overall balance assessment: Modified Independent                                           Pertinent Vitals/Pain Pain Assessment: (c/o numb pain in L LE)    Home Living Family/patient expects to be discharged to:: Private residence Living Arrangements: Spouse/significant other Available Help at Discharge: Available 24 hours/day   Home Access: Ramped entrance     Home Layout: One level Home Equipment: Cane - single point      Prior Function Level of Independence: Independent         Comments: Pt has been helping mother with errands, MD appointments, etc     Hand Dominance   Dominant Hand: Right    Extremity/Trunk Assessment   Upper Extremity Assessment Upper Extremity Assessment: Generalized weakness;Overall [facility] for tasks assessed    Lower Extremity Assessment Lower Extremity Assessment: LLE deficits/detail LLE Deficits / Details: Pt lacks full AROM extension of knee, displayed  decreased coordination with testing/function with foot placement, etc.  ankle DF grossly 3+/5 LLE Sensation: decreased light touch       Communication   Communication: No difficulties  Cognition Arousal/Alertness: Awake/alert Behavior During Therapy: WFL for tasks assessed/performed Overall Cognitive Status: Within Functional Limits for tasks assessed                                        General Comments      Exercises     Assessment/Plan    PT Assessment Patient needs continued PT services  PT Problem List Decreased strength;Decreased range of motion;Decreased activity tolerance;Decreased  balance;Decreased mobility;Decreased coordination;Decreased knowledge of use of DME;Decreased safety awareness;Impaired sensation       PT Treatment Interventions DME instruction;Gait training;Stair training;Functional mobility training;Therapeutic activities;Therapeutic exercise;Balance training;Neuromuscular re-education;Patient/family education    PT Goals (Current goals can be found in the Care Plan section)  Acute Rehab PT Goals Patient Stated Goal: get walking normally again PT Goal Formulation: With patient Time For Goal Achievement: 07/05/17 Potential to Achieve Goals: Good    Frequency Min 2X/week   Barriers to discharge        Co-evaluation               AM-PAC PT "6 Clicks" Daily Activity  Outcome Measure Difficulty turning over in bed (including adjusting bedclothes, sheets and blankets)?: None Difficulty moving from lying on back to sitting on the side of the bed? : None Difficulty sitting down on and standing up from a chair with arms (e.g., wheelchair, bedside commode, etc,.)?: None Help needed moving to and from a bed to chair (including a wheelchair)?: None Help needed walking in hospital room?: A Little Help needed climbing 3-5 steps with a railing? : A Little 6 Click Score: 22    End of Session Equipment Utilized During Treatment: Gait belt Activity Tolerance: Patient tolerated treatment well Patient left: with call bell/phone within reach;in bed Nurse Communication: Mobility status PT Visit Diagnosis: Muscle weakness (generalized) (M62.81);Difficulty in walking, not elsewhere classified (R26.2)    Time: 1437-1500 PT Time Calculation (min) (ACUTE ONLY): 23 min   Charges:   PT Evaluation $PT Eval Low Complexity: 1 Low     PT G Codes:        Kreg Shropshire, DPT 06/21/2017, 3:24 PM

## 2017-06-22 LAB — GLUCOSE, CAPILLARY
GLUCOSE-CAPILLARY: 197 mg/dL — AB (ref 65–99)
Glucose-Capillary: 287 mg/dL — ABNORMAL HIGH (ref 65–99)

## 2017-06-22 MED ORDER — GLIPIZIDE 5 MG PO TABS: 5.0000 mg | ORAL_TABLET | Freq: Two times a day (BID) | ORAL | 0 refills | Status: DC

## 2017-06-22 NOTE — Progress Notes (Signed)
Discharge: Pt d/c from room via wheelchair, Family member with the pt. Discharge instructions given to the patient and family members.  No questions from pt,  Pt dressed in street clothes and left with discharge papers and prescriptions in hand. IV d/ced and no complaints of pain or discomfort.

## 2017-06-22 NOTE — Plan of Care (Signed)
VSS, free of falls during shift.  Reported HA pain 5/10, improved w/ PRN PO Tylenol 650mg .  Reported chronic back pain 4/10, improved to 3/10 w/ scheduled PO tapentadol 100mg .  No other needs overnight.  NIH 1, neuro checks stable.  Bed in low position, call bell within reach.  WCTM.

## 2017-06-22 NOTE — Discharge Summary (Signed)
[place] [facility] [place] NAME: Christine Mcneil    MR#:  [id number]  DATE OF BIRTH:  [birth year]  DATE OF ADMISSION:  06/20/2017 ADMITTING PHYSICIAN: Henreitta Leber, MD  DATE OF DISCHARGE: 06/22/17  PRIMARY CARE PHYSICIAN: Zhou-Talbert, Elwyn Lade, MD    ADMISSION DIAGNOSIS:  Dysphagia, unspecified type [R13.10]  DISCHARGE DIAGNOSIS:  Active Problems:   TIA (transient ischemic attack)   SECONDARY DIAGNOSIS:   Past Medical History:  Diagnosis Date  . Anxiety and depression   . Chronic pain   . Diabetes mellitus without complication ([place])   . Hypertension   . Migraines   . Prothrombin gene mutation ([place])   . Thyroid disease   . TIA (transient ischemic attack) 05/27/2017    HOSPITAL COURSE:   HPI  Christine Mcneil  is a 52 y.o. female with a known history of fibromyalgia, chronic pain, anxiety/depression, hypertension, history of migraines, prothrombin gene mutation, hypothyroidism, previous history of TIA who presents to the hospital due to slurred speech, left-sided numbness. Patient says she was in usual state of health when she developed some slurred speech and left-sided numbness and therefore came to the ER for further evaluation. Patient continues to have persistent symptoms of left-sided numbness but no focal weakness and has intermittent slurred speech. And therefore hospitalist services were consulted for treatment evaluation. Patient CT head is negative for acute pathology here in the ER. Patient does admit to a headache and some nausea but no vomiting. She denies any chest pains, shortness of breath, fevers chills cough or any other associated symptoms.  1. CVA/TIA-  patient's slurred speech and left-sided numbness are improving  patient's initial CT head in the ER is negative for acute pathology. -Patient cannot undergo an MRI she has a spinal stimulator.   carotid duplex with no significant stenosis  Echocardiogram- ejection  fraction 60-65% - cont. Pradaxa  neurology is following -LDL 174 -Hemoglobin A1c is 9.9 Physical therapy is recommending home health PT  2.Diabetes type 2 without complication-continue Januvia, metformin. Glucotrol dose increased to 5 mg twice daily.  Outpatient endocrinology follow-up for tight control of the sugars.  3. Essential hypertension-continue propranolol, quinapril, verapamil.  4. Hyperlipidemia- high intensity Lipitor 40 mg  5. Anxiety/depression-continue Wellbutrin, Remeron, Cymbalta, Klonopin.  6. Chronic pain-continue Nucynta.  7. Hypothyroidism-continue Synthroid.  8. History of prothrombin gene mutation with chronic DVT-continue Pradaxa.  9. GERD-continue Protonix, Pepcid.     DISCHARGE CONDITIONS:   STABLE  CONSULTS OBTAINED:  Treatment Team:  Alexis Goodell, MD   PROCEDURES  NONE   DRUG ALLERGIES:   Allergies  Allergen Reactions  . Amoxicillin Itching  . Keflex [Cephalexin] Itching    DISCHARGE MEDICATIONS:   Allergies as of 06/22/2017      Reactions   Amoxicillin Itching   Keflex [cephalexin] Itching      Medication List    TAKE these medications   acetaZOLAMIDE 500 MG capsule Commonly known as:  DIAMOX Take 500 mg by mouth daily.   aerochamber plus with mask inhaler Use as instructed   AIMOVIG 70 MG/ML Soaj Generic drug:  Erenumab-aooe Inject 70 mg into the skin every 28 (twenty-eight) days.   albuterol 108 (90 Base) MCG/ACT inhaler Commonly known as:  PROVENTIL HFA Inhale 2 puffs into the lungs every 4 (four) hours as needed for wheezing or shortness of breath.   aspirin-acetaminophen-caffeine 250-250-65 MG tablet Commonly known as:  EXCEDRIN MIGRAINE Take 2 tablets by mouth every 8 (eight) hours as  needed for headache or migraine.   atorvastatin 40 MG tablet Commonly known as:  LIPITOR Take 40 mg by mouth daily.   buPROPion 150 MG 12 hr tablet Commonly known as:  WELLBUTRIN SR Take 300 mg by mouth  daily.   clonazePAM 1 MG tablet Commonly known as:  KLONOPIN Take 1 mg by mouth daily as needed.   dabigatran 150 MG Caps capsule Commonly known as:  PRADAXA Take 150 mg by mouth 2 (two) times daily.   DULoxetine 60 MG capsule Commonly known as:  CYMBALTA Take 60 mg by mouth 2 (two) times daily.   esomeprazole 40 MG capsule Commonly known as:  NEXIUM Take 40 mg by mouth daily.   glipiZIDE 5 MG tablet Commonly known as:  GLUCOTROL Take 1 tablet (5 mg total) by mouth 2 (two) times daily before a meal. What changed:  when to take this   hydrOXYzine 50 MG tablet Commonly known as:  ATARAX/VISTARIL Take 50 mg by mouth 3 (three) times daily as needed.   ketorolac 10 MG tablet Commonly known as:  TORADOL Take 1 tablet (10 mg total) by mouth every 6 (six) hours as needed.   lamoTRIgine 100 MG tablet Commonly known as:  LAMICTAL Take 1 tablet by mouth daily.   levothyroxine 100 MCG tablet Commonly known as:  SYNTHROID, LEVOTHROID Take 100 mcg by mouth daily.   loratadine 10 MG tablet Commonly known as:  CLARITIN Take 10 mg by mouth daily as needed for allergies.   MAGNESIUM PO Take 500 mg by mouth 2 (two) times daily.   metFORMIN 500 MG tablet Commonly known as:  GLUCOPHAGE Take 500 mg by mouth 2 (two) times daily.   metoCLOPramide 10 MG tablet Commonly known as:  REGLAN Take 1 tablet (10 mg total) by mouth every 8 (eight) hours as needed for up to 3 days for nausea.   mirtazapine 15 MG tablet Commonly known as:  REMERON Take 1 tablet by mouth at bedtime.   NUCYNTA ER 100 MG 12 hr tablet Generic drug:  tapentadol Take 1 tablet by mouth 2 (two) times daily.   ondansetron 4 MG disintegrating tablet Commonly known as:  ZOFRAN-ODT Take 4 mg by mouth every 8 (eight) hours as needed for nausea or vomiting.   promethazine 25 MG tablet Commonly known as:  PHENERGAN Take 25 mg by mouth every 8 (eight) hours as needed for nausea or vomiting.   propranolol 10 MG  tablet Commonly known as:  INDERAL Take 1 tablet by mouth 3 (three) times daily.   quinapril 10 MG tablet Commonly known as:  ACCUPRIL Take 10 mg by mouth daily.   ranitidine 150 MG tablet Commonly known as:  ZANTAC Take 150 mg by mouth 2 (two) times daily.   rizatriptan 10 MG tablet Commonly known as:  MAXALT Take 1 tab at migraine onset. May repeat in 2 hours prn.  Limit to 2 in 24 hours and to 2 days weekly.   sitaGLIPtin 100 MG tablet Commonly known as:  JANUVIA Take 100 mg by mouth daily.   traMADol 50 MG tablet Commonly known as:  ULTRAM Take 1 tablet (50 mg total) by mouth every 6 (six) hours as needed.   verapamil 240 MG CR tablet Commonly known as:  CALAN-SR Take 1 tablet by mouth daily.   Vitamin D3 1000 units Caps Take 1 capsule by mouth daily.   zolpidem 10 MG tablet Commonly known as:  AMBIEN Take 10 mg by mouth daily.  DISCHARGE INSTRUCTIONS:   Follow-up with primary care physician in 1 week  follow-up with neurology in 3 weeks Follow-up with endocrinology in 2 weeks   DIET:  Cardiac diet and Diabetic diet  DISCHARGE CONDITION:  Stable  ACTIVITY:  Activity as tolerated  OXYGEN:  Home Oxygen: No.   Oxygen Delivery: room air  DISCHARGE LOCATION:  home   If you experience worsening of your admission symptoms, develop shortness of breath, life threatening emergency, suicidal or homicidal thoughts you must seek medical attention immediately by calling 911 or calling your MD immediately  if symptoms less severe.  You Must read complete instructions/literature along with all the possible adverse reactions/side effects for all the Medicines you take and that have been prescribed to you. Take any new Medicines after you have completely understood and accpet all the possible adverse reactions/side effects.   Please note  You were cared for by a hospitalist during your hospital stay. If you have any questions about your discharge  medications or the care you received while you were in the hospital after you are discharged, you can call the unit and asked to speak with the hospitalist on call if the hospitalist that took care of you is not available. Once you are discharged, your primary care physician will handle any further medical issues. Please note that NO REFILLS for any discharge medications will be authorized once you are discharged, as it is imperative that you return to your primary care physician (or establish a relationship with a primary care physician if you do not have one) for your aftercare needs so that they can reassess your need for medications and monitor your lab values.     Today  Chief Complaint  Patient presents with  . Code Stroke   Patient is feeling much better.  Symptoms almost resolved and wants to go home.  Okay to discharge patient from neurology standpoint.  ROS:  CONSTITUTIONAL: Denies fevers, chills. Denies any fatigue, weakness.  EYES: Denies blurry vision, double vision, eye pain. EARS, NOSE, THROAT: Denies tinnitus, ear pain, hearing loss. RESPIRATORY: Denies cough, wheeze, shortness of breath.  CARDIOVASCULAR: Denies chest pain, palpitations, edema.  GASTROINTESTINAL: Denies nausea, vomiting, diarrhea, abdominal pain. Denies bright red blood per rectum. GENITOURINARY: Denies dysuria, hematuria. ENDOCRINE: Denies nocturia or thyroid problems. HEMATOLOGIC AND LYMPHATIC: Denies easy bruising or bleeding. SKIN: Denies rash or lesion. MUSCULOSKELETAL: Denies pain in neck, back, shoulder, knees, hips or arthritic symptoms.  NEUROLOGIC: Denies paralysis, paresthesias.  PSYCHIATRIC: Denies anxiety or depressive symptoms.   VITAL SIGNS:  Blood pressure 125/82, pulse 88, temperature 97.9 F (36.6 C), temperature source Oral, resp. rate 20, height 5\' 6"  (1.676 m), weight 109.8 kg (242 lb), SpO2 94 %.  I/O:    Intake/Output Summary (Last 24 hours) at 06/22/2017 1347 Last data filed  at 06/22/2017 0900 Gross per 24 hour  Intake 360 ml  Output -  Net 360 ml    PHYSICAL EXAMINATION:  GENERAL:  52 y.o.-year-old patient lying in the bed with no acute distress.  EYES: Pupils equal, round, reactive to light and accommodation. No scleral icterus. Extraocular muscles intact.  HEENT: Head atraumatic, normocephalic. Oropharynx and nasopharynx clear.  NECK:  Supple, no jugular venous distention. No thyroid enlargement, no tenderness.  LUNGS: Normal breath sounds bilaterally, no wheezing, rales,rhonchi or crepitation. No use of accessory muscles of respiration.  CARDIOVASCULAR: S1, S2 normal. No murmurs, rubs, or gallops.  ABDOMEN: Soft, non-tender, non-distended. Bowel sounds present. No organomegaly or mass.  EXTREMITIES: No  pedal edema, cyanosis, or clubbing.  NEUROLOGIC: Cranial nerves II through XII are intact. Muscle strength 5/5 in all extremities. Sensation intact. Gait not checked.  PSYCHIATRIC: The patient is alert and oriented x 3.  SKIN: No obvious rash, lesion, or ulcer.   DATA REVIEW:   CBC Recent Labs  Lab 06/20/17 1514  WBC 11.0  HGB 11.3*  HCT 36.2  PLT 364    Chemistries  Recent Labs  Lab 06/20/17 1514  NA 137  K 3.9  CL 104  CO2 23  GLUCOSE 175*  BUN 12  CREATININE 0.82  CALCIUM 9.2  AST 22  ALT 30  ALKPHOS 108  BILITOT 0.6    Cardiac Enzymes Recent Labs  Lab 06/20/17 1514  TROPONINI <0.03    Microbiology Results  Results for orders placed or performed during the hospital encounter of 06/05/17  Urine Culture     Status: Abnormal   Collection Time: 06/05/17  5:13 PM  Result Value Ref Range Status   Specimen Description   Final    URINE, RANDOM Performed at [facility], [address] [place], [place] [address]    Special Requests   Final    NONE Performed at [facility], [address], [place], [place] [address]    Culture MULTIPLE SPECIES PRESENT, SUGGEST RECOLLECTION (A)  Final   Report Status  06/07/2017 FINAL  Final    RADIOLOGY:  US Carotid Bilateral  Result Date: 06/21/2017 CLINICAL DATA:  Stroke symptoms, hypertension, hyperlipidemia EXAM: BILATERAL CAROTID DUPLEX ULTRASOUND TECHNIQUE: Pearline Cables scale imaging, color Doppler and duplex ultrasound were performed of bilateral carotid and vertebral arteries in the neck. COMPARISON:  None. FINDINGS: Criteria: Quantification of carotid stenosis is based on velocity parameters that correlate the residual internal carotid diameter with NASCET-based stenosis levels, using the diameter of the distal internal carotid lumen as the denominator for stenosis measurement. The following velocity measurements were obtained: RIGHT ICA:  105/47 cm/sec CCA:  03/47 cm/sec SYSTOLIC ICA/CCA RATIO:  1.5 DIASTOLIC ICA/CCA RATIO:  2.3 ECA:  80 cm/sec LEFT ICA:  102/49 cm/sec CCA:  42/59 cm/sec SYSTOLIC ICA/CCA RATIO:  1.2 DIASTOLIC ICA/CCA RATIO:  2.2 ECA:  76 cm/sec RIGHT CAROTID ARTERY: Minor echogenic shadowing plaque formation. No hemodynamically significant right ICA stenosis, velocity elevation, or turbulent flow. Degree of narrowing less than 50%. RIGHT VERTEBRAL ARTERY:  Antegrade LEFT CAROTID ARTERY: Similar scattered minor echogenic plaque formation. No hemodynamically significant left ICA stenosis, velocity elevation, or turbulent flow. LEFT VERTEBRAL ARTERY:  Antegrade IMPRESSION: Minor carotid atherosclerosis. No hemodynamically significant ICA stenosis. Degree of narrowing less than 50% bilaterally. Patent antegrade vertebral flow bilaterally Electronically Signed   By: Jerilynn Mages.  Shick M.D.   On: 06/21/2017 11:22   Ct Head Code Stroke Wo Contrast  Result Date: 06/20/2017 CLINICAL DATA:  Code stroke. LEFT-sided weakness and difficulty swallowing which began 1 hour ago. EXAM: CT HEAD WITHOUT CONTRAST TECHNIQUE: Contiguous axial images were obtained from the base of the skull through the vertex without intravenous contrast. COMPARISON:  06/05/2017. FINDINGS: Brain: No  evidence of acute infarction, hemorrhage, hydrocephalus, extra-axial collection or mass lesion/mass effect. Vascular: Calcification of the cavernous internal carotid arteries consistent with cerebrovascular atherosclerotic disease. No signs of intracranial large vessel occlusion. Skull: Intact. Sinuses/Orbits: Negative Other: Negative ASPECTS ([place] Stroke Program Early CT Score) - Ganglionic level infarction (caudate, lentiform nuclei, internal capsule, insula, M1-M3 cortex): 7 - Supraganglionic infarction (M4-M6 cortex): 3 Total score (0-10 with 10 being normal): 10 IMPRESSION: 1. Negative and stable exam.  2. ASPECTS is 10. 3. These results were called by telephone at the time of interpretation on 06/20/2017 at 3:17 pm to Dr. Lenise Arena , who verbally acknowledged these results. Electronically Signed   By: Staci Righter M.D.   On: 06/20/2017 15:19    EKG:   Orders placed or performed during the hospital encounter of 06/20/17  . ED EKG  . ED EKG  . EKG 12-Lead  . EKG 12-Lead      Management plans discussed with the patient, family and they are in agreement.  CODE STATUS:     Code Status Orders  (From admission, onward)        Start     Ordered   06/20/17 2105  Full code  Continuous     06/20/17 2104    Code Status History    Date Active Date Inactive Code Status Order ID Comments User Context   01/03/2017 17:43 01/06/2017 18:19 Full Code [id number]  Henreitta Leber, MD Inpatient      TOTAL TIME TAKING CARE OF THIS PATIENT: 42 minutes.   Note: This dictation was prepared with Dragon dictation along with smaller phrase technology. Any transcriptional errors that result from this process are unintentional.   @[facility] @  on 06/22/2017 at 1:47 PM  Between 7am to 6pm - Pager - [phone]  After 6pm go to [email or web address] - password [email or web address] [name]  Office  [phone]  CC: Primary care physician; Zhou-Talbert, Elwyn Lade, MD

## 2017-06-22 NOTE — Care Management Note (Signed)
Case Management Note  Patient Details  Name: Christine Mcneil MRN: [id number] Date of Birth: 1965-06-05  Subjective/Objective:     Admitted to [facility] under observation status with the diagnosis of TIA. Lives with husband. Sees Dr. Earma Reading as primary care physician    Discharge to home today per Dr. Margaretmary Eddy.          Action/Plan: Physical therapy evaluation completed. Recommending home with home health and therapy. States she has had [facility] in the past, would like them again. Will update Floydene Flock, [facility] representative.   Expected Discharge Date:  06/22/17               Expected Discharge Plan:     In-House Referral:   yes  Discharge planning Services   yes  Post Acute Care Choice:   yes Choice offered to:   patient  DME Arranged:    DME Agency:     HH Arranged:   yes HH Agency:   [facility]   Status of Service:     If discussed at [place] of Stay Meetings, dates discussed:    Additional Comments:  Shelbie Ammons, RN MSN CCM Care Management [phone] 06/22/2017, 12:32 PM

## 2017-06-22 NOTE — Progress Notes (Signed)
[facility]  Address: [address], [place], [place] [address]  Phone: [phone]  PCP: [facility], Dr. Earma Reading  Cardio: Dollene Primrose E Hinton 06/22/2017, 1:24 PM

## 2017-06-22 NOTE — Progress Notes (Signed)
Dr. Margaretmary Eddy-  Please consider increasing pt's Glipizide to 5 mg BID for home.  Patient committed to making dietary changes as well.    Met with pt this afternoon.  Spoke with patient about her current A1c of 9.9%.  Explained what an A1c is and what it measures.  Reminded patient that her goal A1c is 7% or less per ADA standards to prevent both acute and long-term complications.  Explained to patient the extreme importance of good glucose control at home.  Encouraged patient to check her CBGs at least bid at home (fasting and another check within the day) and to record all CBGs in a logbook for her PCP to review.  Patient told me she eats a whole can of biscuits per day b/c of constant nausea from pain and headaches.  Also eating large amounts of breaded chicken nuggets for dinner.  Likes to drink Diet Sprite at home (which is OK).  Discussed with patient that a whole can of biscuits is likely causing elevated CBGs at home.  Encouraged pt to stop this practice and to switch to eating 1-2 servings of saltines throughout the day if needed for nausea (eat crackers slowly and stretch them out throughout the day if possible).  Encouraged pt to switch to non-breaded chicken breasts if possible (many stores have frozen pre-cooked chicken).    Encouraged pt to make a follow-up appt with her PCP soon after d/c for further DM management. Patient agreeable to this.    --Will follow patient during hospitalization--  Wyn Quaker RN, MSN, CDE Diabetes Coordinator Inpatient Glycemic Control Team Team Pager: [phone] (8a-5p)

## 2017-06-22 NOTE — Discharge Instructions (Addendum)
Follow-up with primary care physician in 1 week  follow-up with neurology in 3 weeks Follow-up with endocrinology in 2 weeks

## 2017-06-22 NOTE — Progress Notes (Signed)
Inpatient Diabetes Program Recommendations  AACE/ADA: New Consensus Statement on Inpatient Glycemic Control (2015)  Target Ranges:  Prepandial:   less than 140 mg/dL      Peak postprandial:   less than 180 mg/dL (1-2 hours)      Critically ill patients:  140 - 180 mg/dL   Results for Christine Mcneil, Christine Mcneil (MRN [id number]) as of 06/22/2017 11:22  Ref. Range 06/21/2017 09:51  Hemoglobin A1C Latest Ref Range: 4.8 - 5.6 % 9.9 (H)   Results for Christine Mcneil, Christine Mcneil (MRN [id number]) as of 06/22/2017 11:22  Ref. Range 06/21/2017 12:30 06/21/2017 16:47 06/21/2017 21:38 06/22/2017 07:59  Glucose-Capillary Latest Ref Range: 65 - 99 mg/dL 165 (H) 133 (H) 139 (H) 197 (H)    Admit with: Code Stroke  History: DM  Home DM Meds: Januvia 100 mg daily       Glipizide 5 mg daily       Metformin 500 mg BID  Current Insulin Orders: Novolog Sensitive Correction Scale/ SSI (0-9 units) TID AC + HS      Tradjenta 5 mg daily      Metformin 500 mg BID      MD- Note patient admitted with Code Stroke.  Current A1c of 9.9% shows poor glucose control prior to admission.  Plan to speak with pt to determine best course of action to take for her DM medications for discharge.      --Will follow patient during hospitalization--  Wyn Quaker RN, MSN, CDE Diabetes Coordinator Inpatient Glycemic Control Team Team Pager: [phone] (8a-5p)

## 2017-07-12 ENCOUNTER — Other Ambulatory Visit: Payer: Self-pay | Admitting: Neurology

## 2017-07-12 DIAGNOSIS — R202 Paresthesia of skin: Secondary | ICD-10-CM

## 2017-07-19 ENCOUNTER — Other Ambulatory Visit: Payer: Self-pay

## 2017-07-19 ENCOUNTER — Ambulatory Visit (INDEPENDENT_AMBULATORY_CARE_PROVIDER_SITE_OTHER): Payer: Medicare Other | Admitting: Psychiatry

## 2017-07-19 ENCOUNTER — Encounter: Payer: Self-pay | Admitting: Psychiatry

## 2017-07-19 VITALS — BP 116/79 | HR 108 | Temp 97.6°F | Wt 239.2 lb

## 2017-07-19 DIAGNOSIS — F331 Major depressive disorder, recurrent, moderate: Secondary | ICD-10-CM

## 2017-07-19 DIAGNOSIS — H469 Unspecified optic neuritis: Secondary | ICD-10-CM | POA: Insufficient documentation

## 2017-07-19 DIAGNOSIS — G4733 Obstructive sleep apnea (adult) (pediatric): Secondary | ICD-10-CM

## 2017-07-19 DIAGNOSIS — F4310 Post-traumatic stress disorder, unspecified: Secondary | ICD-10-CM | POA: Diagnosis not present

## 2017-07-19 DIAGNOSIS — F5105 Insomnia due to other mental disorder: Secondary | ICD-10-CM | POA: Diagnosis not present

## 2017-07-19 MED ORDER — LAMOTRIGINE 100 MG PO TABS: 100.0000 mg | ORAL_TABLET | Freq: Every day | ORAL | 1 refills | Status: DC

## 2017-07-19 MED ORDER — BUPROPION HCL ER (XL) 150 MG PO TB24: 150.0000 mg | ORAL_TABLET | Freq: Every day | ORAL | 2 refills | Status: DC

## 2017-07-19 MED ORDER — TRAZODONE HCL 50 MG PO TABS: 50.0000 mg | ORAL_TABLET | Freq: Every day | ORAL | 1 refills | Status: DC

## 2017-07-19 MED ORDER — ARIPIPRAZOLE 2 MG PO TABS: 2.0000 mg | ORAL_TABLET | Freq: Every day | ORAL | 1 refills | Status: DC

## 2017-07-19 MED ORDER — DULOXETINE HCL 30 MG PO CPEP: 90.0000 mg | ORAL_CAPSULE | Freq: Every day | ORAL | 0 refills | Status: DC
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
## 2017-07-19 NOTE — Patient Instructions (Signed)
Trazodone tablets What is this medicine? TRAZODONE (TRAZ oh done) is used to treat depression. This medicine may be used for other purposes; ask your health care provider or pharmacist if you have questions. COMMON BRAND NAME(S): Desyrel What should I tell my health care provider before I take this medicine? They need to know if you have any of these conditions: -attempted suicide or thinking about it -bipolar disorder -bleeding problems -glaucoma -heart disease, or previous heart attack -irregular heart beat -kidney or liver disease -low levels of sodium in the blood -an unusual or allergic reaction to trazodone, other medicines, foods, dyes or preservatives -pregnant or trying to get pregnant -breast-feeding How should I use this medicine? Take this medicine by mouth with a glass of water. Follow the directions on the prescription label. Take this medicine shortly after a meal or a light snack. Take your medicine at regular intervals. Do not take your medicine more often than directed. Do not stop taking this medicine suddenly except upon the advice of your doctor. Stopping this medicine too quickly may cause serious side effects or your condition may worsen. A special MedGuide will be given to you by the pharmacist with each prescription and refill. Be sure to read this information carefully each time. Talk to your pediatrician regarding the use of this medicine in children. Special care may be needed. Overdosage: If you think you have taken too much of this medicine contact a poison control center or emergency room at once. NOTE: This medicine is only for you. Do not share this medicine with others. What if I miss a dose? If you miss a dose, take it as soon as you can. If it is almost time for your next dose, take only that dose. Do not take double or extra doses. What may interact with this medicine? Do not take this medicine with any of the following medications: -certain medicines  for fungal infections like fluconazole, itraconazole, ketoconazole, posaconazole, voriconazole -cisapride -dofetilide -dronedarone -linezolid -MAOIs like Carbex, Eldepryl, Marplan, Nardil, and Parnate -mesoridazine -methylene blue (injected into a vein) -pimozide -saquinavir -thioridazine -ziprasidone This medicine may also interact with the following medications: -alcohol -antiviral medicines for HIV or AIDS -aspirin and aspirin-like medicines -barbiturates like phenobarbital -certain medicines for blood pressure, heart disease, irregular heart beat -certain medicines for depression, anxiety, or psychotic disturbances -certain medicines for migraine headache like almotriptan, eletriptan, frovatriptan, naratriptan, rizatriptan, sumatriptan, zolmitriptan -certain medicines for seizures like carbamazepine and phenytoin -certain medicines for sleep -certain medicines that treat or prevent blood clots like dalteparin, enoxaparin, warfarin -digoxin -fentanyl -lithium -NSAIDS, medicines for pain and inflammation, like ibuprofen or naproxen -other medicines that prolong the QT interval (cause an abnormal heart rhythm) -rasagiline -supplements like St. John's wort, kava kava, valerian -tramadol -tryptophan This list may not describe all possible interactions. Give your health care provider a list of all the medicines, herbs, non-prescription drugs, or dietary supplements you use. Also tell them if you smoke, drink alcohol, or use illegal drugs. Some items may interact with your medicine. What should I watch for while using this medicine? Tell your doctor if your symptoms do not get better or if they get worse. Visit your doctor or health care professional for regular checks on your progress. Because it may take several weeks to see the full effects of this medicine, it is important to continue your treatment as prescribed by your doctor. Patients and their families should watch out for new  or worsening thoughts of suicide or depression. Also   watch out for sudden changes in feelings such as feeling anxious, agitated, panicky, irritable, hostile, aggressive, impulsive, severely restless, overly excited and hyperactive, or not being able to sleep. If this happens, especially at the beginning of treatment or after a change in dose, call your health care professional. You may get drowsy or dizzy. Do not drive, use machinery, or do anything that needs mental alertness until you know how this medicine affects you. Do not stand or sit up quickly, especially if you are an older patient. This reduces the risk of dizzy or fainting spells. Alcohol may interfere with the effect of this medicine. Avoid alcoholic drinks. This medicine may cause dry eyes and blurred vision. If you wear contact lenses you may feel some discomfort. Lubricating drops may help. See your eye doctor if the problem does not go away or is severe. Your mouth may get dry. Chewing sugarless gum, sucking hard candy and drinking plenty of water may help. Contact your doctor if the problem does not go away or is severe. What side effects may I notice from receiving this medicine? Side effects that you should report to your doctor or health care professional as soon as possible: -allergic reactions like skin rash, itching or hives, swelling of the face, lips, or tongue -elevated mood, decreased need for sleep, racing thoughts, impulsive behavior -confusion -fast, irregular heartbeat -feeling faint or lightheaded, falls -feeling agitated, angry, or irritable -loss of balance or coordination -painful or prolonged erections -restlessness, pacing, inability to keep still -suicidal thoughts or other mood changes -tremors -trouble sleeping -seizures -unusual bleeding or bruising Side effects that usually do not require medical attention (report to your doctor or health care professional if they continue or are bothersome): -change in  sex drive or performance -change in appetite or weight -constipation -headache -muscle aches or pains -nausea This list may not describe all possible side effects. Call your doctor for medical advice about side effects. You may report side effects to FDA at 1-800-FDA-1088. Where should I keep my medicine? Keep out of the reach of children. Store at room temperature between 15 and 30 degrees C (59 to 86 degrees F). Protect from light. Keep container tightly closed. Throw away any unused medicine after the expiration date. NOTE: This sheet is a summary. It may not cover all possible information. If you have questions about this medicine, talk to your doctor, pharmacist, or health care provider.  2018 [employer] (2015-10-09 16:57:05) Aripiprazole tablets What is this medicine? ARIPIPRAZOLE (ay ri PIP ray zole) is an atypical antipsychotic. It is used to treat schizophrenia and bipolar disorder, also known as manic-depression. It is also used to treat Tourette's disorder and some symptoms of autism. This medicine may also be used in combination with antidepressants to treat major depressive disorder. This medicine may be used for other purposes; ask your health care provider or pharmacist if you have questions. COMMON BRAND NAME(S): Abilify What should I tell my health care provider before I take this medicine? They need to know if you have any of these conditions: -dehydration -dementia -diabetes -heart disease -history of stroke -low blood counts, like low white cell, platelet, or red cell counts -Parkinson's disease -seizures -suicidal thoughts, plans, or attempt; a previous suicide attempt by you or a family member -an unusual or allergic reaction to aripiprazole, other medicines, foods, dyes, or preservatives -pregnant or trying to get pregnant -breast-feeding How should I use this medicine? Take this medicine by mouth with a glass of water. Follow   the directions on the  prescription label. You can take this medicine with or without food. Take your doses at regular intervals. Do not take your medicine more often than directed. Do not stop taking except on the advice of your doctor or health care professional. A special MedGuide will be given to you by the pharmacist with each prescription and refill. Be sure to read this information carefully each time. Talk to your pediatrician regarding the use of this medicine in children. While this drug may be prescribed for children as young as 6 years of age for selected conditions, precautions do apply. Overdosage: If you think you have taken too much of this medicine contact a poison control center or emergency room at once. NOTE: This medicine is only for you. Do not share this medicine with others. What if I miss a dose? If you miss a dose, take it as soon as you can. If it is almost time for your next dose, take only that dose. Do not take double or extra doses. What may interact with this medicine? Do not take this medicine with any of the following medications: -brexpiprazole -cisapride -dofetilide -dronedarone -metoclopramide -pimozide -thioridazine This medicine may also interact with the following medications: -alcohol -carbamazepine -certain medicines for anxiety or sleep -certain medicines for blood pressure -certain medicines for fungal infections like ketoconazole, fluconazole, posaconazole, and itraconazole -clarithromycin -fluoxetine -other medicines that prolong the QT interval (cause an abnormal heart rhythm) -paroxetine -quinidine -rifampin This list may not describe all possible interactions. Give your health care provider a list of all the medicines, herbs, non-prescription drugs, or dietary supplements you use. Also tell them if you smoke, drink alcohol, or use illegal drugs. Some items may interact with your medicine. What should I watch for while using this medicine? Visit your doctor or  health care professional for regular checks on your progress. It may be several weeks before you see the full effects of this medicine. Do not suddenly stop taking this medicine. You may need to gradually reduce the dose. Patients and their families should watch out for worsening depression or thoughts of suicide. Also watch out for sudden changes in feelings such as feeling anxious, agitated, panicky, irritable, hostile, aggressive, impulsive, severely restless, overly excited and hyperactive, or not being able to sleep. If this happens, especially at the beginning of antidepressant treatment or after a change in dose, call your health care professional. You may get dizzy or drowsy. Do not drive, use machinery, or do anything that needs mental alertness until you know how this medicine affects you. Do not stand or sit up quickly, especially if you are an older patient. This reduces the risk of dizzy or fainting spells. Alcohol can increase dizziness and drowsiness. Avoid alcoholic drinks. This medicine can reduce the response of your body to heat or cold. Dress warm in cold weather and stay hydrated in hot weather. If possible, avoid extreme temperatures like saunas, hot tubs, very hot or cold showers, or activities that can cause dehydration such as vigorous exercise. This medicine may cause dry eyes and blurred vision. If you wear contact lenses you may feel some discomfort. Lubricating drops may help. See your eye doctor if the problem does not go away or is severe. If you notice an increased hunger or thirst, different from your normal hunger or thirst, or if you find that you have to urinate more frequently, you should contact your health care provider as soon as possible. You may need to have   your blood sugar monitored. This medicine may cause changes in your blood sugar levels. You should monitor you blood sugar frequently if you have diabetes. There have been reports of uncontrollable and strong urges  to gamble, binge eat, shop, and have sex while taking this medicine. If you experience any of these or other uncontrollable and strong urges while taking this medicine, you should report it to your health care provider as soon as possible. What side effects may I notice from receiving this medicine? Side effects that you should report to your doctor or health care professional as soon as possible: -allergic reactions like skin rash, itching or hives, swelling of the face, lips, or tongue -breathing problems -confusion -feeling faint or lightheaded, falls -fever or chills, sore throat -increased hunger or thirst -increased urination -joint pain -muscles pain, spasms -problems with balance, talking, walking -restlessness or need to keep moving -seizures -suicidal thoughts or other mood changes -trouble swallowing -uncontrollable and excessive urges (examples: gambling, binge eating, shopping, having sex) -uncontrollable head, mouth, neck, arm, or leg movements -unusually weak or tired Side effects that usually do not require medical attention (report to your doctor or health care professional if they continue or are bothersome): -blurred vision -constipation -headache -nausea, vomiting -trouble sleeping -weight gain This list may not describe all possible side effects. Call your doctor for medical advice about side effects. You may report side effects to FDA at 1-800-FDA-1088. Where should I keep my medicine? Keep out of the reach of children. Store at room temperature between 15 and 30 degrees C (59 and 86 degrees F). Throw away any unused medicine after the expiration date. NOTE: This sheet is a summary. It may not cover all possible information. If you have questions about this medicine, talk to your doctor, pharmacist, or health care provider.  2018 [employer] (2016-04-25 11:45:05)  

## 2017-07-20 ENCOUNTER — Encounter: Payer: Self-pay | Admitting: Psychiatry

## 2017-07-26 ENCOUNTER — Ambulatory Visit: Payer: Medicare Other

## 2017-07-28 ENCOUNTER — Ambulatory Visit
Admission: RE | Admit: 2017-07-28 | Discharge: 2017-07-28 | Disposition: A | Discharge status: Home / Self Care | Payer: Medicare Other | Source: Ambulatory Visit | Attending: Neurology | Admitting: Neurology

## 2017-07-28 ENCOUNTER — Ambulatory Visit: Admission: RE | Admit: 2017-07-28 | Payer: Medicare Other | Source: Ambulatory Visit

## 2017-07-28 DIAGNOSIS — R202 Paresthesia of skin: Secondary | ICD-10-CM | POA: Insufficient documentation

## 2017-07-28 DIAGNOSIS — G932 Benign intracranial hypertension: Secondary | ICD-10-CM | POA: Diagnosis not present

## 2017-08-16 ENCOUNTER — Other Ambulatory Visit: Payer: Self-pay

## 2017-08-16 ENCOUNTER — Ambulatory Visit (INDEPENDENT_AMBULATORY_CARE_PROVIDER_SITE_OTHER): Payer: Medicare Other | Admitting: Licensed Clinical Social Worker

## 2017-08-16 ENCOUNTER — Ambulatory Visit (INDEPENDENT_AMBULATORY_CARE_PROVIDER_SITE_OTHER): Payer: Medicare Other | Admitting: Psychiatry

## 2017-08-16 ENCOUNTER — Encounter: Payer: Self-pay | Admitting: Psychiatry

## 2017-08-16 VITALS — BP 124/84 | HR 94 | Temp 98.0°F | Wt 232.8 lb

## 2017-08-16 DIAGNOSIS — F5105 Insomnia due to other mental disorder: Secondary | ICD-10-CM

## 2017-08-16 DIAGNOSIS — G4733 Obstructive sleep apnea (adult) (pediatric): Secondary | ICD-10-CM | POA: Diagnosis not present

## 2017-08-16 DIAGNOSIS — F331 Major depressive disorder, recurrent, moderate: Secondary | ICD-10-CM | POA: Diagnosis not present

## 2017-08-16 DIAGNOSIS — F4310 Post-traumatic stress disorder, unspecified: Secondary | ICD-10-CM | POA: Diagnosis not present

## 2017-08-16 MED ORDER — LAMOTRIGINE 100 MG PO TABS: 100.0000 mg | ORAL_TABLET | Freq: Every day | ORAL | 1 refills | Status: DC

## 2017-08-16 MED ORDER — TRAZODONE HCL 50 MG PO TABS: 50.0000 mg | ORAL_TABLET | Freq: Every day | ORAL | 2 refills | Status: DC

## 2017-08-16 MED ORDER — ARIPIPRAZOLE 2 MG PO TABS: 2.0000 mg | ORAL_TABLET | Freq: Every day | ORAL | 2 refills | Status: DC

## 2017-08-16 MED ORDER — BUPROPION HCL ER (XL) 150 MG PO TB24: 150.0000 mg | ORAL_TABLET | Freq: Every day | ORAL | 2 refills | Status: DC
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
## 2017-09-06 ENCOUNTER — Other Ambulatory Visit: Payer: Self-pay | Admitting: Psychiatry

## 2017-09-07 LAB — PROLACTIN: Prolactin: 5.5 ng/mL (ref 4.8–23.3)

## 2017-09-07 LAB — TSH: TSH: 2.89 u[IU]/mL (ref 0.450–4.500)

## 2017-09-13 ENCOUNTER — Ambulatory Visit (INDEPENDENT_AMBULATORY_CARE_PROVIDER_SITE_OTHER): Payer: Medicare Other | Admitting: Licensed Clinical Social Worker

## 2017-09-13 DIAGNOSIS — F331 Major depressive disorder, recurrent, moderate: Secondary | ICD-10-CM | POA: Diagnosis not present

## 2017-09-13 DIAGNOSIS — F4310 Post-traumatic stress disorder, unspecified: Secondary | ICD-10-CM

## 2017-09-13 NOTE — Progress Notes (Signed)
  THERAPIST PROGRESS NOTE   Date of Service:   09/13/17  Session Time:   1 hour  Patient:   DEJHA KING   DOB:   1965/08/28  MR Number:  [id number]  Location:  [facility] [place] [facility] [place] [address] Dept: [phone]            Provider/Observer:  Lubertha South Counselor  Risk of Suicide/Violence: virtually non-existent   Diagnosis:    MDD (major depressive disorder), recurrent episode, moderate (HCC)  PTSD (post-traumatic stress disorder)  Type of Therapy: Individual Therapy  Treatment Goals addressed: Coping  Participation Level: Active    Behavioral Observation: JALEIAH ASAY  presents as a 51 y.o.-year-old  Interventions: CBT and Motivational Interviewing   Behavioral Response: CasualAlertEuthymic   Summary: Therapist met with Patient in an outpatient setting to assess current mood and assist with making progress towards goals through the use of therapeutic intervention. Therapist did a brief mood check, assessing anger, fear, disgust, excitement, happiness, and sadness.  Patient reports that she is having difficulty with her current relationship.  She reports that she is unclear if she wants to continue the relationship.  SHe reports that she is not happy and trying to weigh out her options. She was able to verbally list the pros and cons of the relationship.  Discussion of her relationship with her son who has IDD.  Explored what she wants and how to achieve her personal goals.    Plan: ANGELLYNN KIMBERLIN will continue with therapy   Return again in 2 weeks.

## 2017-09-27 ENCOUNTER — Ambulatory Visit (INDEPENDENT_AMBULATORY_CARE_PROVIDER_SITE_OTHER): Payer: Medicare Other | Admitting: Licensed Clinical Social Worker

## 2017-09-27 DIAGNOSIS — F331 Major depressive disorder, recurrent, moderate: Secondary | ICD-10-CM | POA: Diagnosis not present

## 2017-09-28 NOTE — Progress Notes (Signed)
   THERAPIST PROGRESS NOTE  Session Time: 45 min  Participation Level: Active  Behavioral Response: CasualAlertEuthymic  Type of Therapy: Individual Therapy  Treatment Goals addressed: Coping  Interventions: CBT and Motivational Interviewing  Summary: Christine Mcneil is a 52 y.o. female who presents with a reduction in symptoms.  Patient reports that she had a conversation with her boyfriend and was able to discuss with him her feelings as practiced in the previous session.  She reports that she is happier with the relationship and no longer wants him to move out of the home.  Therapist explained Inadequate pattern of communication with partner, family members, employer leading to frequent arguing and conflicts.    Suicidal/Homicidal: No  Plan: Return again in 2 weeks.  Diagnosis: Axis I: Depression    Axis II: No diagnosis    Lubertha South, LCSW 09/28/2017

## 2017-10-05 ENCOUNTER — Other Ambulatory Visit: Payer: Self-pay | Admitting: Psychiatry

## 2017-10-05 DIAGNOSIS — F331 Major depressive disorder, recurrent, moderate: Secondary | ICD-10-CM

## 2017-10-11 ENCOUNTER — Ambulatory Visit: Payer: Medicare Other | Admitting: Licensed Clinical Social Worker

## 2017-10-13 ENCOUNTER — Encounter: Payer: Self-pay | Admitting: Psychiatry

## 2017-10-13 ENCOUNTER — Other Ambulatory Visit: Payer: Self-pay

## 2017-10-13 ENCOUNTER — Ambulatory Visit (INDEPENDENT_AMBULATORY_CARE_PROVIDER_SITE_OTHER): Payer: Medicare Other | Admitting: Psychiatry

## 2017-10-13 ENCOUNTER — Ambulatory Visit: Payer: Medicare Other | Attending: Neurology | Admitting: Physical Therapy

## 2017-10-13 VITALS — BP 137/88 | HR 94 | Temp 97.9°F | Ht 66.0 in | Wt 220.8 lb

## 2017-10-13 DIAGNOSIS — F4310 Post-traumatic stress disorder, unspecified: Secondary | ICD-10-CM

## 2017-10-13 DIAGNOSIS — R296 Repeated falls: Secondary | ICD-10-CM | POA: Diagnosis present

## 2017-10-13 DIAGNOSIS — F5105 Insomnia due to other mental disorder: Secondary | ICD-10-CM

## 2017-10-13 DIAGNOSIS — R269 Unspecified abnormalities of gait and mobility: Secondary | ICD-10-CM | POA: Diagnosis present

## 2017-10-13 DIAGNOSIS — F331 Major depressive disorder, recurrent, moderate: Secondary | ICD-10-CM

## 2017-10-13 DIAGNOSIS — M6281 Muscle weakness (generalized): Secondary | ICD-10-CM

## 2017-10-13 MED ORDER — ARIPIPRAZOLE 2 MG PO TABS: 2.0000 mg | ORAL_TABLET | Freq: Every day | ORAL | 0 refills | Status: DC

## 2017-10-13 MED ORDER — DULOXETINE HCL 30 MG PO CPEP: 90.0000 mg | ORAL_CAPSULE | Freq: Every day | ORAL | 0 refills | Status: DC

## 2017-10-13 MED ORDER — BUPROPION HCL ER (XL) 150 MG PO TB24: 150.0000 mg | ORAL_TABLET | Freq: Every day | ORAL | 0 refills | Status: DC

## 2017-10-13 MED ORDER — LAMOTRIGINE 100 MG PO TABS: ORAL_TABLET | ORAL | 0 refills | Status: DC

## 2017-10-13 NOTE — Progress Notes (Signed)
[place] MD OP Progress Note  10/13/2017 10:09 AM Christine Mcneil  MRN:  [id number]  Chief Complaint: ' I am here for follow up." Chief Complaint    Follow-up; Medication Refill     HPI: Christine Mcneil is a 52 year old Caucasian female, divorced, lives in [place], [place], has a history of depression, anger issues, PTSD, sleep problem, OSA noncompliant with CPAP, history of CVA, history of prothrombin gene mutation, chronic pain, migraine, presented to the clinic today for a follow-up visit.  She today reports she is currently doing well on the current medication regimen.  She is compliant on the medications as prescribed.  She denies any significant side effects.  She reports her mood lability and racing thoughts are under control.  She is not anxious or as depressed as she used to be before.  She reports she continues to have relationship conflicts with her sister who has taken a 27 B against her.  She however reports she does not ruminate about that anymore.  She continues to be involved in her mother's care.  Patient reports she and her boyfriend went to the counselor together and that has helped their relationship to get better.  Patient reports she had  minor surgery for her skin cancer and its healing well.  She is not worried about that.  She reports she is doing things for fun and exercising.  She reports she and her boyfriend are going to a concert today.  She looks forward to that.  She reports sleep as good on the Ambien.  She tried taking the trazodone however that is giving her nightmares.  Discussed with her to stop the trazodone and just try the Ambien.  She will reach out to me if the Ambien is not working.  Her Ambien is being prescribed by her other provider.   Visit Diagnosis:    ICD-10-CM   1. MDD (major depressive disorder), recurrent episode, moderate (HCC) F33.1 ARIPiprazole (ABILIFY) 2 MG tablet    buPROPion (WELLBUTRIN XL) 150 MG 24 hr tablet    lamoTRIgine (LAMICTAL) 100 MG tablet   2. PTSD (post-traumatic stress disorder) F43.10   3. Insomnia due to mental disorder F51.05     Past Psychiatric History: Reviewed past psychiatric history from my progress note on 08/16/2017.  Past trials of Effexor, Klonopin.  Past Medical History:  Past Medical History:  Diagnosis Date  . Anxiety and depression   . Chronic pain   . Depression   . Diabetes mellitus without complication ([place])   . Diabetes mellitus, type II ([place])   . Hypertension   . Migraines   . Prothrombin gene mutation ([place])   . Thyroid disease   . TIA (transient ischemic attack) 05/27/2017    Past Surgical History:  Procedure Laterality Date  . ABDOMINAL HYSTERECTOMY    . CHOLECYSTECTOMY    . KNEE SURGERY Left   . ROTATOR CUFF REPAIR Right     Family Psychiatric History: Reviewed Family psychiatric history from my progress note on 08/16/2017.  Family History:  Family History  Problem Relation Age of Onset  . Diabetes Mother   . Hyperlipidemia Mother   . Hypertension Mother   . COPD Mother   . CVA Mother   . Diabetes Father   . Hyperlipidemia Father   . Hypertension Father   . Thyroid disease Father   . Alcohol abuse Father   . Anxiety disorder Son   . Depression Son   . Bipolar disorder Son  Social History: Reviewed Social history from my progress note on 08/16/2017. Social History   Socioeconomic History  . Marital status: Divorced    Spouse name: Not on file  . Number of children: 1  . Years of education: Not on file  . Highest education level: High school graduate  Occupational History    Comment: disablitiy  Social Needs  . Financial resource strain: Not hard at all  . Food insecurity:    Worry: Never true    Inability: Never true  . Transportation needs:    Medical: No    Non-medical: No  Tobacco Use  . Smoking status: Never Smoker  . Smokeless tobacco: Never Used  Substance and Sexual Activity  . Alcohol use: No  . Drug use: No  . Sexual activity: Not Currently   Lifestyle  . Physical activity:    Days per week: 0 days    Minutes per session: 0 min  . Stress: Very much  Relationships  . Social connections:    Talks on phone: Once a week    Gets together: Never    Attends religious service: More than 4 times per year    Active member of club or organization: No    Attends meetings of clubs or organizations: Never    Relationship status: Divorced  Other Topics Concern  . Not on file  Social History Narrative  . Not on file    Allergies:  Allergies  Allergen Reactions  . Amoxicillin Itching  . Amoxapine Itching  . Keflex [Cephalexin] Itching    Metabolic Disorder Labs: Lab Results  Component Value Date   HGBA1C 9.9 (H) 06/21/2017   MPG 237.43 06/21/2017   Lab Results  Component Value Date   PROLACTIN 5.5 09/06/2017   Lab Results  Component Value Date   CHOL 277 (H) 06/21/2017   TRIG 196 (H) 06/21/2017   HDL 64 06/21/2017   CHOLHDL 4.3 06/21/2017   VLDL 39 06/21/2017   LDLCALC 174 (H) 06/21/2017   Lab Results  Component Value Date   TSH 2.890 09/06/2017   TSH 3.176 01/04/2017    Therapeutic Level Labs: No results found for: LITHIUM No results found for: VALPROATE No components found for:  CBMZ  Current Medications: Current Outpatient Medications  Medication Sig Dispense Refill  . acetaZOLAMIDE (DIAMOX) 500 MG capsule Take 500 mg by mouth daily.  3  . AIMOVIG 70 MG/ML SOAJ Inject 70 mg into the skin every 28 (twenty-eight) days.    . ARIPiprazole (ABILIFY) 2 MG tablet Take 1 tablet (2 mg total) by mouth daily. 90 tablet 0  . aspirin-acetaminophen-caffeine (EXCEDRIN MIGRAINE) 250-250-65 MG tablet Take 2 tablets by mouth every 8 (eight) hours as needed for headache or migraine.     Marland Kitchen atorvastatin (LIPITOR) 40 MG tablet Take 40 mg by mouth daily.    Marland Kitchen buPROPion (WELLBUTRIN XL) 150 MG 24 hr tablet Take 1 tablet (150 mg total) by mouth daily. 90 tablet 0  . Cholecalciferol (VITAMIN D3) 1000 units CAPS Take 1 capsule  by mouth daily.    . DULoxetine (CYMBALTA) 30 MG capsule Take 3 capsules (90 mg total) by mouth daily. 270 capsule 0  . esomeprazole (NEXIUM) 40 MG capsule Take 40 mg by mouth daily.    . fluticasone (FLONASE) 50 MCG/ACT nasal spray     . glipiZIDE (GLUCOTROL) 5 MG tablet Take 1 tablet (5 mg total) by mouth 2 (two) times daily before a meal. 60 tablet 0  . hydrOXYzine (ATARAX/VISTARIL) 50  MG tablet Take 50 mg by mouth 3 (three) times daily as needed.     Marland Kitchen ketoconazole (NIZORAL) 2 % cream 1 application.    Marland Kitchen lamoTRIgine (LAMICTAL) 100 MG tablet TAKE (1) TABLET BY MOUTH EVERY DAY 90 tablet 0  . levothyroxine (SYNTHROID, LEVOTHROID) 100 MCG tablet Take 100 mcg by mouth daily.    Marland Kitchen loratadine (CLARITIN) 10 MG tablet Take 10 mg by mouth daily as needed for allergies.    Marland Kitchen MAGNESIUM PO Take 500 mg by mouth 2 (two) times daily.    . metFORMIN (GLUCOPHAGE) 500 MG tablet Take 500 mg by mouth 2 (two) times daily.    Gean [place]chwood ER 100 MG 12 hr tablet Take 1 tablet by mouth 2 (two) times daily.    . ondansetron (ZOFRAN-ODT) 4 MG disintegrating tablet Take 4 mg by mouth every 8 (eight) hours as needed for nausea or vomiting.    . promethazine (PHENERGAN) 25 MG tablet Take 25 mg by mouth every 8 (eight) hours as needed for nausea or vomiting.    . propranolol (INDERAL) 10 MG tablet Take 1 tablet by mouth 3 (three) times daily.    . quinapril (ACCUPRIL) 10 MG tablet Take 10 mg by mouth daily.    . ranitidine (ZANTAC) 150 MG tablet Take 150 mg by mouth 2 (two) times daily.    . sitaGLIPtin (JANUVIA) 100 MG tablet Take 100 mg by mouth daily.    . verapamil (CALAN-SR) 240 MG CR tablet Take 1 tablet by mouth daily.    . dabigatran (PRADAXA) 150 MG CAPS capsule Take 150 mg by mouth 2 (two) times daily.    . metoCLOPramide (REGLAN) 10 MG tablet Take 1 tablet (10 mg total) by mouth every 8 (eight) hours as needed for up to 3 days for nausea. 20 tablet 0  . zolpidem (AMBIEN) 10 MG tablet Take 10 mg by mouth daily.      No current facility-administered medications for this visit.      Musculoskeletal: Strength & Muscle Tone: within normal limits Gait & Station: normal Patient leans: N/A  Psychiatric Specialty Exam: Review of Systems  Psychiatric/Behavioral: Positive for depression (improved). The patient has insomnia.   All other systems reviewed and are negative.   Blood pressure 137/88, pulse 94, temperature 97.9 F (36.6 C), temperature source Oral, height 5\' 6"  (1.676 m), weight 220 lb 12.8 oz (100.2 kg).Body mass index is 35.64 kg/m.  General Appearance: Casual  Mcneil Contact:  Fair  Speech:  Clear and Coherent  Volume:  Normal  Mood:  Euthymic  Affect:  Congruent  Thought Process:  Goal Directed and Descriptions of Associations: Intact  Orientation:  Full (Time, Place, and Person)  Thought Content: Logical   Suicidal Thoughts:  No  Homicidal Thoughts:  No  Memory:  Immediate;   Fair Recent;   Fair Remote;   Fair  Judgement:  Fair  Insight:  Fair  Psychomotor Activity:  Normal  Concentration:  Concentration: Fair and Attention Span: Fair  Recall:  [employer] of Knowledge: Fair  Language: Fair  Akathisia:  No  Handed:  Right  AIMS (if indicated): 0  Assets:  Communication Skills Desire for Improvement Housing Social Support  ADL's:  Intact  Cognition: WNL  Sleep:  improved   Screenings:   Assessment and Plan: Lianni is a 52 year old Caucasian female who has a history of depression, PTSD, multiple medical problems including chronic pain, migraine, history of CVA, OSA noncompliant with CPAP, prothrombin gene mutation, presented  to the clinic today for a follow-up visit.  Patient reports she is doing well on the current medication regimen.  She did not tolerate the trazodone well and hence discussed medication readjustment.  She will continue psychotherapy with Ms. Peacock.  Plan as noted below.  Plan MDD Continue Cymbalta 90 mg p.o. daily Continue Lamictal 100 mg p.o.  daily Continue Abilify 2 mg p.o. daily to augment the Cymbalta Continue Wellbutrin XL 150 mg p.o. Daily  PTSD Continue Cymbalta 90 mg p.o. daily  For insomnia Continue Ambien as prescribed.  I have reviewed [place] controlled substance database. Ambien is being prescribed by her other provider Discussed with her to discontinue trazodone due to side effects. She will reach out to me if she has sleep problems.  I have reviewed her TSH, prolactin-within normal limits on 09/06/2017.  Follow-up in clinic in 2-3 months or sooner if needed.  More than 50 % of the time was spent for psychoeducation and supportive psychotherapy and care coordination.  This note was generated in part or whole with voice recognition software. Voice recognition is usually quite accurate but there are transcription errors that can and very often do occur. I apologize for any typographical errors that were not detected and corrected.        Ursula Alert, MD 10/13/2017, 10:09 AM

## 2017-10-15 NOTE — Therapy (Addendum)
[facility] [address] [place], [place], [address] Phone: [phone]   Fax:  [phone]  Physical Therapy Evaluation  Patient Details  Name: Christine Mcneil MRN: [id number] Date of Birth: 15-May-1966 Referring Provider: Dr. Jennings Books   Encounter Date: 10/13/2017  PT End of Session - 10/23/17 1513    Visit Number  1    Number of Visits  8    Date for PT Re-Evaluation  11/10/17    PT Start Time  1424    PT Stop Time  1525    PT Time Calculation (min)  61 min    Equipment Utilized During Treatment  Gait belt    Activity Tolerance  Patient tolerated treatment well    Behavior During Therapy  [facility] for tasks assessed/performed       Past Medical History:  Diagnosis Date  . Anxiety and depression   . Chronic pain   . Depression   . Diabetes mellitus without complication ([place])   . Diabetes mellitus, type II ([place])   . Hypertension   . Migraines   . Prothrombin gene mutation ([place])   . Thyroid disease   . TIA (transient ischemic attack) 05/27/2017    Past Surgical History:  Procedure Laterality Date  . ABDOMINAL HYSTERECTOMY    . CHOLECYSTECTOMY    . KNEE SURGERY Left   . ROTATOR CUFF REPAIR Right     There were no vitals filed for this visit.   Subjective Assessment - 10/23/17 1507    Subjective  Pt. reports falling 3+ weeks ago over a gate.  Pt. has used a SPC in past and entered PT clinic today with no assistive device and SBA from friend Christine Mcneil).  Pt. reports 5/10 B hip/LEP currently at rest (increase pain with standing and referral pain in B ankles/ feet.      Pertinent History  Significant PMHx    Limitations  Standing;Walking;House hold activities    Patient Stated Goals  Increase LE strength/ balance/ independence with walking    Currently in Pain?  Yes    Pain Score  5     Pain Location  Back    Pain Orientation  Lower    Pain Descriptors / Indicators  Aching    Pain Type  Chronic pain    Pain Onset  More  than a month ago    Pain Frequency  Intermittent         See HEP (handout provided).    PT Education - 10/23/17 1513    Education Details  Standing marching/ SLR/ clamshells/ mini squats    Person(s) Educated  Patient;Caregiver(s)    Methods  Explanation;Demonstration;Handout    Comprehension  Verbalized understanding;Returned demonstration          PT Long Term Goals - 10/23/17 1541      PT LONG TERM GOAL #1   Title  Pt. will be independent with HEP to increase B LE muscle strength 1/2 muscle grade to improve standing tolerance/ gait.      Baseline  R LE strength grossly 4+/5 MMT except hip flexion 4/5 MMT. L LE strength grossly 4/5 MMT except quad 3/5/ hip flexion 3+/5 MMT.     Time  4    Period  Weeks    Status  New    Target Date  11/10/17      PT LONG TERM GOAL #2   Title  Pt. will increase FOTO from 32 to 40 to improve functional  mobility.     Baseline  baseline FOTO 32 on 10/13/17    Time  4    Period  Weeks    Status  New    Target Date  11/10/17      PT LONG TERM GOAL #3   Title  Pt. will increase Berg balance score to >51/56 to decrease fall risk/ improve safety with gait.      Baseline  Berg: 46/56 on 5/23    Time  4    Period  Weeks    Status  New    Target Date  11/10/17      PT LONG TERM GOAL #4   Title  Pt. able to ambulate outside on grassy terrain with no LOB to promote independence with gait.      Baseline  increase fall risk on grass/ falls reported while gardening    Time  4    Period  Weeks    Status  New    Target Date  11/10/17             Plan - 10/23/17 1514    Clinical Impression Statement  Pt. is a pleasant 52 y/o female with chronic c/o low back pain.  Pt. reports recent fall and difficulty walking.  Pt. reports 5/10 LBP with B LE radicular symptoms reported/ L LE numbness.  Pt. presents with good cervical spine AROM WFL (L UT muscle tightness).  B shoulder AROM WFL but pain with overhead reaching/ MMT.  Grip strength: L 34#/  R 39.5#.  R LE strength grossly 4+/5 MMT except hip flexion 4/5 MMT.  L LE strength grossly 4/5 MMT except quad 3/5/ hip flexion 3+/5 MMT.  Pt. able to stand with no UE assist.  SBA with mat table mobility secondary to extra/ difficulty pushing up to sit.  Pt. recently had a friend, Christine Mcneil, move in to assist with daily tasks.  Christine Mcneil: 61/95.  Pt. ambulates with L antalgic gait with occasional staggered gait, esp. with head turns/ upright posture.  Pt. will benefit from skilled PT services to increase B LE muscle strength to improve safety/ independence with gait and balance.      History and Personal Factors relevant to plan of care:  L LE numbness reported.  Chronic LBP and received injections every 4 months.  Pt. has had 2 TIAs in past (05/2017).  Pt. R handed and states she has multiple L RTC tears/ bone spurs.  Pt. has shower chair at home/ uses electric cart at [place] and only wears sneakers.  Pt. enjoys fishing/ gardening.      Clinical Presentation  Evolving    Clinical Decision Making  Moderate    Rehab Potential  Fair    PT Frequency  2x / week    PT Duration  4 weeks    PT Treatment/Interventions  ADLs/Self Care Home Management;Aquatic Therapy;Gait training;Moist Heat;Cryotherapy;Stair training;Functional mobility training;Therapeutic activities;Balance training;Therapeutic exercise;Patient/family education;Neuromuscular re-education;Manual techniques;Passive range of motion    PT Next Visit Plan  Progress HEP/ LE strengthening.  Encourage pt. to use SPC.         Patient will benefit from skilled therapeutic intervention in order to improve the following deficits and impairments:  Abnormal gait, Improper body mechanics, Pain, Postural dysfunction, Decreased coordination, Decreased activity tolerance, Decreased endurance, Decreased range of motion, Decreased strength, Obesity, Impaired flexibility, Difficulty walking, Decreased safety awareness, Decreased balance  Visit Diagnosis: Gait  difficulty  Repeated falls  Muscle weakness (generalized)     Problem List  Patient Active Problem List   Diagnosis Date Noted  . 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 TIA (transient ischemic attack) 06/20/2017  . Primary osteoarthritis of both hips 06/20/2017  . Periodic limb movements of sleep 05/30/2017  . Shortness of breath 01/03/2017  . 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 OSA (obstructive sleep apnea) 07/01/2016  . Chronic prostatitis/chronic pelvic pain syndrome 05/28/2016  . Migraine without aura and without status migrainosus, not intractable 05/11/2016  . Chronic anticoagulation 03/29/2016  . Encounter for monitoring opioid maintenance therapy 11/04/2015  . Dysphagia, neurologic 09/16/2015  . Numbness 09/16/2015  . Anti-cardiolipin antibody positive 09/01/2015  . Prothrombin gene mutation ([place]) 09/01/2015  . History of cerebral venous sinus thrombosis 06/27/2015  . Anemia, iron deficiency 10/02/2014  . Chronic thoracic back pain 07/24/2014  . Bilateral occipital neuralgia 04/11/2014  . Hypothyroidism, unspecified 10/17/2013  . Type 2 diabetes, HbA1c goal < 7% (HCC) 10/17/2013  . Cervicogenic headache 07/04/2013  . Cervical spondylosis without myelopathy 11/16/2012  . Rotator cuff syndrome 11/13/2012  . Sinus congestion 11/07/2012  . Abdominal pain 09/28/2012  . Rectal bleeding 09/28/2012  . Depression 02/22/2012  . GERD (gastroesophageal reflux disease) 02/22/2012  . Essential hypertension 02/22/2012  . Obesity, unspecified 02/22/2012  . Neuromuscular disorder ([place]) 02/22/2012  . Pain in joint, pelvic region and thigh 01/17/2012  . Insomnia secondary to chronic pain 10/04/2011  . Right shoulder pain 10/04/2011  . Atypical facial pain 10/03/2011  . Chronic daily headache 10/03/2011  . Fibromyalgia 10/03/2011  . Hyperlipidemia, unspecified 10/03/2011   Pura Spice, PT, DPT # [phone] 10/23/2017, 3:45 PM  [facility]  [facility] [address] [place], [place], [address] Phone: [phone]   Fax:  [phone]  Name: Christine Mcneil MRN: [id number] Date of Birth: 09/03/1965

## 2017-10-18 ENCOUNTER — Ambulatory Visit: Payer: Medicare Other | Admitting: Physical Therapy

## 2017-10-18 DIAGNOSIS — R269 Unspecified abnormalities of gait and mobility: Secondary | ICD-10-CM | POA: Diagnosis not present

## 2017-10-18 DIAGNOSIS — R296 Repeated falls: Secondary | ICD-10-CM

## 2017-10-18 DIAGNOSIS — M6281 Muscle weakness (generalized): Secondary | ICD-10-CM

## 2017-10-18 DIAGNOSIS — G43109 Migraine with aura, not intractable, without status migrainosus: Secondary | ICD-10-CM | POA: Insufficient documentation

## 2017-10-21 ENCOUNTER — Ambulatory Visit: Payer: Medicare Other | Admitting: Physical Therapy

## 2017-10-21 ENCOUNTER — Encounter: Payer: Self-pay | Admitting: Physical Therapy

## 2017-10-21 DIAGNOSIS — R296 Repeated falls: Secondary | ICD-10-CM

## 2017-10-21 DIAGNOSIS — M6281 Muscle weakness (generalized): Secondary | ICD-10-CM

## 2017-10-21 DIAGNOSIS — R269 Unspecified abnormalities of gait and mobility: Secondary | ICD-10-CM | POA: Diagnosis not present

## 2017-10-21 NOTE — Therapy (Addendum)
[facility] [address]. [place], [place], [address] Phone: [phone]   Fax:  [phone]  Physical Therapy Treatment  Patient Details  Name: Christine Mcneil MRN: [id number] Date of Birth: Sep 22, 1965 Referring Provider: Dr. Jennings Books   Encounter Date: 10/18/2017  PT End of Session - 10/31/17 1243    Visit Number  2    Number of Visits  8    Date for PT Re-Evaluation  11/10/17    PT Start Time  1624    PT Stop Time  1731    PT Time Calculation (min)  67 min    Equipment Utilized During Treatment  Gait belt    Activity Tolerance  Patient tolerated treatment well    Behavior During Therapy  [facility] for tasks assessed/performed       Past Medical History:  Diagnosis Date  . Anxiety and depression   . Chronic pain   . Depression   . Diabetes mellitus without complication ([place])   . Diabetes mellitus, type II ([place])   . Hypertension   . Migraines   . Prothrombin gene mutation ([place])   . Thyroid disease   . TIA (transient ischemic attack) 05/27/2017    Past Surgical History:  Procedure Laterality Date  . ABDOMINAL HYSTERECTOMY    . CHOLECYSTECTOMY    . KNEE SURGERY Left   . ROTATOR CUFF REPAIR Right     There were no vitals filed for this visit.  Subjective Assessment - 10/31/17 1230    Subjective  Pt. states she had PT at [facility] with Kathleene Hazel for hip pain.  See MD order.  PT discussed combining balance and hip PT tx. session.  No hip pain at this time but back is hurting.      Pertinent History  Significant PMHx    Limitations  Standing;Walking;House hold activities    Patient Stated Goals  Increase LE strength/ balance/ independence with walking    Currently in Pain?  Yes    Pain Score  5     Pain Location  Back    Pain Orientation  Lower    Pain Descriptors / Indicators  Aching    Pain Type  Chronic pain    Pain Onset  More than a month ago    Pain Frequency  Intermittent       Treatment:  There.ex.:  Nustep L6 10 min. B UE/LE (warm-up)- consistent cadence/ cuing for posture. Reviewed hip HEP (see handouts).  Neuro.mm.: Sit to stand from bariatric chair 10x (limited UE assist)- mirror feedback. Step over 3" plinth with gait in //-bars forward 6x. Tandem stance/ gait in //-bars with min. To no UE assist.   Turning CW/CCW. Functional reaching L/R for cones Hallway with head turns/ varying cadence. Outside walking on varying terrain Up/down curbs with no assistive device.        PT Long Term Goals - 10/23/17 1541      PT LONG TERM GOAL #1   Title  Pt. will be independent with HEP to increase B LE muscle strength 1/2 muscle grade to improve standing tolerance/ gait.      Baseline  R LE strength grossly 4+/5 MMT except hip flexion 4/5 MMT. L LE strength grossly 4/5 MMT except quad 3/5/ hip flexion 3+/5 MMT.     Time  4    Period  Weeks    Status  New    Target Date  11/10/17      PT LONG  TERM GOAL #2   Title  Pt. will increase FOTO from 32 to 40 to improve functional mobility.     Baseline  baseline FOTO 32 on 10/13/17    Time  4    Period  Weeks    Status  New    Target Date  11/10/17      PT LONG TERM GOAL #3   Title  Pt. will increase Berg balance score to >51/56 to decrease fall risk/ improve safety with gait.      Baseline  Berg: 46/56 on 5/23    Time  4    Period  Weeks    Status  New    Target Date  11/10/17      PT LONG TERM GOAL #4   Title  Pt. able to ambulate outside on grassy terrain with no LOB to promote independence with gait.      Baseline  increase fall risk on grass/ falls reported while gardening    Time  4    Period  Weeks    Status  New    Target Date  11/10/17          Plan - 10/31/17 1244    Clinical Impression Statement  Pt. requires extra time to stand from bariatric chair and benefits from use of UE assist.  PT reviewed HEP for hip ROM/ strengthening with no questions from pt.  DIfficulty with hallway walking at varying cadences with added  head turns.  No LOB but pt. guarded/ careful to prevent falls.  Pts. caregiver present during tx. session.      Clinical Presentation  Evolving    Clinical Decision Making  Moderate    Rehab Potential  Fair    PT Frequency  2x / week    PT Duration  4 weeks    PT Treatment/Interventions  ADLs/Self Care Home Management;Aquatic Therapy;Gait training;Moist Heat;Cryotherapy;Stair training;Functional mobility training;Therapeutic activities;Balance training;Therapeutic exercise;Patient/family education;Neuromuscular re-education;Manual techniques;Passive range of motion    PT Next Visit Plan  Progress HEP/ LE strengthening.  Encourage pt. to use SPC.  Assess hip.         Patient will benefit from skilled therapeutic intervention in order to improve the following deficits and impairments:  Abnormal gait, Improper body mechanics, Pain, Postural dysfunction, Decreased coordination, Decreased activity tolerance, Decreased endurance, Decreased range of motion, Decreased strength, Obesity, Impaired flexibility, Difficulty walking, Decreased safety awareness, Decreased balance  Visit Diagnosis: Gait difficulty  Repeated falls  Muscle weakness (generalized)     Problem List Patient Active Problem List   Diagnosis Date Noted  . 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 TIA (transient ischemic attack) 06/20/2017  . Primary osteoarthritis of both hips 06/20/2017  . Periodic limb movements of sleep 05/30/2017  . Shortness of breath 01/03/2017  . 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 OSA (obstructive sleep apnea) 07/01/2016  . Chronic prostatitis/chronic pelvic pain syndrome 05/28/2016  . Migraine without aura and without status migrainosus, not intractable 05/11/2016  . Chronic anticoagulation 03/29/2016  . Encounter for monitoring opioid maintenance therapy 11/04/2015  . Dysphagia, neurologic 09/16/2015  . Numbness 09/16/2015  . Anti-cardiolipin antibody positive 09/01/2015  . Prothrombin gene  mutation ([place]) 09/01/2015  . History of cerebral venous sinus thrombosis 06/27/2015  . Anemia, iron deficiency 10/02/2014  . Chronic thoracic back pain 07/24/2014  . Bilateral occipital neuralgia 04/11/2014  . Hypothyroidism, unspecified 10/17/2013  . Type 2 diabetes, HbA1c goal < 7% (HCC) 10/17/2013  . Cervicogenic headache 07/04/2013  . Cervical spondylosis without  myelopathy 11/16/2012  . Rotator cuff syndrome 11/13/2012  . Sinus congestion 11/07/2012  . Abdominal pain 09/28/2012  . Rectal bleeding 09/28/2012  . Depression 02/22/2012  . GERD (gastroesophageal reflux disease) 02/22/2012  . Essential hypertension 02/22/2012  . Obesity, unspecified 02/22/2012  . Neuromuscular disorder ([place]) 02/22/2012  . Pain in joint, pelvic region and thigh 01/17/2012  . Insomnia secondary to chronic pain 10/04/2011  . Right shoulder pain 10/04/2011  . Atypical facial pain 10/03/2011  . Chronic daily headache 10/03/2011  . Fibromyalgia 10/03/2011  . Hyperlipidemia, unspecified 10/03/2011   Pura Spice, PT, DPT # [id number] 10/31/2017, 12:52 PM  [facility] [address] [place], [place], [address] Phone: [phone]   Fax:  [phone]  Name: SALAYA HOLTROP MRN: [id number] Date of Birth: 14-Jun-1965
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
## 2017-10-23 NOTE — Addendum Note (Signed)
Addended by: Pura Spice on: 10/23/2017 03:48 PM   Modules accepted: Orders

## 2017-10-24 ENCOUNTER — Ambulatory Visit: Payer: Medicare Other | Attending: Neurology | Admitting: Physical Therapy

## 2017-10-24 DIAGNOSIS — R269 Unspecified abnormalities of gait and mobility: Secondary | ICD-10-CM | POA: Insufficient documentation

## 2017-10-24 DIAGNOSIS — R296 Repeated falls: Secondary | ICD-10-CM | POA: Insufficient documentation

## 2017-10-24 DIAGNOSIS — M6281 Muscle weakness (generalized): Secondary | ICD-10-CM | POA: Insufficient documentation

## 2017-10-27 ENCOUNTER — Encounter: Payer: Medicare Other | Admitting: Physical Therapy

## 2017-10-31 ENCOUNTER — Encounter: Payer: Medicare Other | Admitting: Physical Therapy

## 2017-10-31 ENCOUNTER — Encounter: Payer: Self-pay | Admitting: Physical Therapy

## 2017-11-03 ENCOUNTER — Encounter: Payer: Self-pay | Admitting: Physical Therapy

## 2017-11-03 ENCOUNTER — Ambulatory Visit: Payer: Medicare Other | Admitting: Physical Therapy

## 2017-11-03 DIAGNOSIS — M6281 Muscle weakness (generalized): Secondary | ICD-10-CM

## 2017-11-03 DIAGNOSIS — R269 Unspecified abnormalities of gait and mobility: Secondary | ICD-10-CM | POA: Diagnosis present

## 2017-11-03 DIAGNOSIS — R296 Repeated falls: Secondary | ICD-10-CM

## 2017-11-03 NOTE — Therapy (Signed)
[facility] [address] [place], [place], [address] Phone: [phone]   Fax:  [phone]  Physical Therapy Treatment  Patient Details  Name: Christine Mcneil MRN: [id number] Date of Birth: 20-Sep-1965 Referring Provider: Dr. Jennings Books   Encounter Date: 11/03/2017  PT End of Session - 11/03/17 1548    Visit Number  4    Number of Visits  8    Date for PT Re-Evaluation  11/10/17    PT Start Time  1346    PT Stop Time  1440    PT Time Calculation (min)  54 min    Equipment Utilized During Treatment  Gait belt    Activity Tolerance  Patient tolerated treatment well    Behavior During Therapy  [facility] for tasks assessed/performed       Past Medical History:  Diagnosis Date  . Anxiety and depression   . Chronic pain   . Depression   . Diabetes mellitus without complication ([place])   . Diabetes mellitus, type II ([place])   . Hypertension   . Migraines   . Prothrombin gene mutation ([place])   . Thyroid disease   . TIA (transient ischemic attack) 05/27/2017    Past Surgical History:  Procedure Laterality Date  . ABDOMINAL HYSTERECTOMY    . CHOLECYSTECTOMY    . KNEE SURGERY Left   . ROTATOR CUFF REPAIR Right     There were no vitals filed for this visit.  Subjective Assessment - 11/04/17 1035    Subjective  Pt. entered PT and reports she is doing well today.  Pt. has been busy with court and reports no pain at rest.  Pt. states she has increase R gluteal/ ischial tuberosity pain with prolonged sitting.     Pertinent History  Significant PMHx    Patient Stated Goals  Increase LE strength/ balance/ independence with walking    Currently in Pain?  No/denies      Treatment:  There Ex:   2 laps around gym with 7 1/2# cuff weight Seated B marches with 7 1/2# cuff weight 2x10 Seated B LAQ with 7 1/2# cuff weight 2x10 Seated B Knee Flexion BlueTB 2x10 Stretching of B hip in all planes of movement 2x30sec each  10 min on NuStep (not  billed/ warmup)  Neuro:  Dynamic ambulation exercises in hallway with use of sticky notes/ counting backwards/ changes in cadence/ head turns/ 2x each all with CGA and gaitbelt       PT Long Term Goals - 11/03/17 1602      PT LONG TERM GOAL #1   Title  Pt. will be independent with HEP to increase B LE muscle strength 1/2 muscle grade to improve standing tolerance/ gait.      Baseline  R LE strength grossly 4+/5 MMT except hip flexion 4/5 MMT. L LE strength grossly 4/5 MMT except quad 3/5/ hip flexion 3+/5 MMT.     Time  4    Period  Weeks    Status  On-going    Target Date  11/10/17      PT LONG TERM GOAL #2   Title  Pt. will increase FOTO from 32 to 40 to improve functional mobility.     Baseline  baseline FOTO 32 on 10/13/17    Time  4    Period  Weeks    Status  On-going    Target Date  11/10/17      PT LONG TERM GOAL #3  Title  Pt. will increase Berg balance score to >51/56 to decrease fall risk/ improve safety with gait.      Baseline  Berg 55/56 on 6/13    Time  4    Period  Weeks    Status  Achieved    Target Date  11/03/17      PT LONG TERM GOAL #4   Title  Pt. able to ambulate outside on grassy terrain with no LOB to promote independence with gait.      Baseline  increase fall risk on grass/ falls reported while gardening    Time  4    Period  Weeks    Status  New    Target Date  11/10/17            Plan - 11/03/17 1552    Clinical Impression Statement  Pt. reports to be doing well without any falls in several months.  Pt. has increase LE strength and has been able to progress ther ex to using blueTB and 7 1/2# cuff weight for seated exercises.  During dynamic ambulation activities in hallway CGA was used with gait belt.  Pt. demonstrated gait deviation and slowing of cadence during dynamic ambulation activities in hallway, which will be a focal point for future tx sessions.  Pt. will continue to benefitc from therapy to increase dynamic ambulation and  balance tasks needed for community ambulation.      Clinical Presentation  Evolving    Clinical Decision Making  Moderate    Rehab Potential  Fair    PT Frequency  2x / week    PT Duration  4 weeks    PT Treatment/Interventions  ADLs/Self Care Home Management;Aquatic Therapy;Gait training;Moist Heat;Cryotherapy;Stair training;Functional mobility training;Therapeutic activities;Balance training;Therapeutic exercise;Patient/family education;Neuromuscular re-education;Manual techniques;Passive range of motion    PT Next Visit Plan  Progress HEP/ LE strengthening.  Encourage pt. to use SPC.  Assess hip.         Patient will benefit from skilled therapeutic intervention in order to improve the following deficits and impairments:  Abnormal gait, Improper body mechanics, Pain, Postural dysfunction, Decreased coordination, Decreased activity tolerance, Decreased endurance, Decreased range of motion, Decreased strength, Obesity, Impaired flexibility, Difficulty walking, Decreased safety awareness, Decreased balance  Visit Diagnosis: Gait difficulty  Repeated falls  Muscle weakness (generalized)     Problem List Patient Active Problem List   Diagnosis Date Noted  . 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 TIA (transient ischemic attack) 06/20/2017  . Primary osteoarthritis of both hips 06/20/2017  . Periodic limb movements of sleep 05/30/2017  . Shortness of breath 01/03/2017  . 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 OSA (obstructive sleep apnea) 07/01/2016  . Chronic prostatitis/chronic pelvic pain syndrome 05/28/2016  . Migraine without aura and without status migrainosus, not intractable 05/11/2016  . Chronic anticoagulation 03/29/2016  . Encounter for monitoring opioid maintenance therapy 11/04/2015  . Dysphagia, neurologic 09/16/2015  . Numbness 09/16/2015  . Anti-cardiolipin antibody positive 09/01/2015  . Prothrombin gene mutation ([place]) 09/01/2015  . History of cerebral venous  sinus thrombosis 06/27/2015  . Anemia, iron deficiency 10/02/2014  . Chronic thoracic back pain 07/24/2014  . Bilateral occipital neuralgia 04/11/2014  . Hypothyroidism, unspecified 10/17/2013  . Type 2 diabetes, HbA1c goal < 7% (HCC) 10/17/2013  . Cervicogenic headache 07/04/2013  . Cervical spondylosis without myelopathy 11/16/2012  . Rotator cuff syndrome 11/13/2012  . Sinus congestion 11/07/2012  . Abdominal pain 09/28/2012  . Rectal bleeding 09/28/2012  . Depression 02/22/2012  .  GERD (gastroesophageal reflux disease) 02/22/2012  . Essential hypertension 02/22/2012  . Obesity, unspecified 02/22/2012  . Neuromuscular disorder ([place]) 02/22/2012  . Pain in joint, pelvic region and thigh 01/17/2012  . Insomnia secondary to chronic pain 10/04/2011  . Right shoulder pain 10/04/2011  . Atypical facial pain 10/03/2011  . Chronic daily headache 10/03/2011  . Fibromyalgia 10/03/2011  . Hyperlipidemia, unspecified 10/03/2011   Pura Spice, PT, DPT # [id number] [place], SPT 11/04/2017, 10:38 AM  [facility] [address] [place], [place], [address] Phone: [phone]   Fax:  [phone]  Name: Christine Mcneil MRN: [id number] Date of Birth: Apr 14, 1966

## 2017-11-07 ENCOUNTER — Encounter: Payer: Self-pay | Admitting: Physical Therapy

## 2017-11-07 ENCOUNTER — Ambulatory Visit: Payer: Medicare Other | Admitting: Physical Therapy

## 2017-11-07 DIAGNOSIS — R296 Repeated falls: Secondary | ICD-10-CM

## 2017-11-07 DIAGNOSIS — M6281 Muscle weakness (generalized): Secondary | ICD-10-CM

## 2017-11-07 DIAGNOSIS — R269 Unspecified abnormalities of gait and mobility: Secondary | ICD-10-CM

## 2017-11-07 NOTE — Therapy (Signed)
[facility] [address] [place], [place], [address] Phone: [phone]   Fax:  [phone]  Physical Therapy Treatment  Patient Details  Name: Christine Mcneil MRN: [id number] Date of Birth: 1965/07/10 Referring Provider: Dr. Jennings Books   Encounter Date: 11/07/2017  PT End of Session - 11/07/17 1537    Visit Number  5    Number of Visits  8    Date for PT Re-Evaluation  11/10/17    PT Start Time  0214    PT Stop Time  0306    PT Time Calculation (min)  52 min    Equipment Utilized During Treatment  Gait belt    Activity Tolerance  Patient tolerated treatment well    Behavior During Therapy  [facility] for tasks assessed/performed       Past Medical History:  Diagnosis Date  . Anxiety and depression   . Chronic pain   . Depression   . Diabetes mellitus without complication ([place])   . Diabetes mellitus, type II ([place])   . Hypertension   . Migraines   . Prothrombin gene mutation ([place])   . Thyroid disease   . TIA (transient ischemic attack) 05/27/2017    Past Surgical History:  Procedure Laterality Date  . ABDOMINAL HYSTERECTOMY    . CHOLECYSTECTOMY    . KNEE SURGERY Left   . ROTATOR CUFF REPAIR Right     There were no vitals filed for this visit.  Subjective Assessment - 11/07/17 1535    Subjective  Pt. reports that she had a fall yesterday in an attempt to assist her ex-husband with unloading a golf cart.  Pt. reports bruising on hip and has visible scrape marks on R LE.  Pt. reports to also be experiencing soreness from the fall.   Pt. reports no pain but muscle soreness.      Pertinent History  Significant PMHx    Limitations  Standing;Walking;House hold activities    Patient Stated Goals  Increase LE strength/ balance/ independence with walking    Currently in Pain?  No/denies       Treatment:  There Ex:  Resisted BlueTB 4-way 2x15 10x Sit-to-stand Implemented new HEP (BTB issued for ankle ex.).   Nustep for 10  min. B UE/LE (warm-up/not billed)  Neuro:  Outside walking on variable surfaces Static standing on Aerex pad 2x30sec Narrow BOS standing on Aerex pad 2x30sec Tandem stance on Aerex 2x30sec Single-leg standing on Airex pad 2x30sec Obstacle course using Airex pad, and sit disks Clockwise and counter-clockwise rotations on wobbleboard x10 each direction     PT Education - 11/07/17 1508    Education Details  See ankle exercises/ sit to stands with no UE assist.     Person(s) Educated  Patient;Caregiver(s)    Methods  Explanation;Demonstration;Handout    Comprehension  Verbalized understanding;Returned demonstration          PT Long Term Goals - 11/03/17 1602      PT LONG TERM GOAL #1   Title  Pt. will be independent with HEP to increase B LE muscle strength 1/2 muscle grade to improve standing tolerance/ gait.      Baseline  R LE strength grossly 4+/5 MMT except hip flexion 4/5 MMT. L LE strength grossly 4/5 MMT except quad 3/5/ hip flexion 3+/5 MMT.     Time  4    Period  Weeks    Status  On-going    Target Date  11/10/17  PT LONG TERM GOAL #2   Title  Pt. will increase FOTO from 32 to 40 to improve functional mobility.     Baseline  baseline FOTO 32 on 10/13/17    Time  4    Period  Weeks    Status  On-going    Target Date  11/10/17      PT LONG TERM GOAL #3   Title  Pt. will increase Berg balance score to >51/56 to decrease fall risk/ improve safety with gait.      Baseline  Berg 55/56 on 6/13    Time  4    Period  Weeks    Status  Achieved    Target Date  11/03/17      PT LONG TERM GOAL #4   Title  Pt. able to ambulate outside on grassy terrain with no LOB to promote independence with gait.      Baseline  increase fall risk on grass/ falls reported while gardening    Time  4    Period  Weeks    Status  New    Target Date  11/10/17            Plan - 11/07/17 1538    Clinical Impression Statement  Pt. progressed with exercises today well.  Pt.  performed obstacle course in // bars to challenge LE proprioception and stability.  Ankle instability was noted during obstacle course, so repeated movements were performed adding in resisted ankle exercises in all planes of movement.  Pt. performed well with walking outside on variable surfaces as well.  Pt. continues to progress with skilled therapy, and will continue to benefit from increase HEP for ankle instability to decrease fall risk.    Clinical Presentation  Evolving    Clinical Decision Making  Moderate    Rehab Potential  Fair    PT Frequency  2x / week    PT Duration  4 weeks    PT Treatment/Interventions  ADLs/Self Care Home Management;Aquatic Therapy;Gait training;Moist Heat;Cryotherapy;Stair training;Functional mobility training;Therapeutic activities;Balance training;Therapeutic exercise;Patient/family education;Neuromuscular re-education;Manual techniques;Passive range of motion    PT Next Visit Plan  Progress HEP/ LE strengthening.  Encourage pt. to use SPC.  Assess hip.      PT Home Exercise Plan  see HEP       Patient will benefit from skilled therapeutic intervention in order to improve the following deficits and impairments:  Abnormal gait, Improper body mechanics, Pain, Postural dysfunction, Decreased coordination, Decreased activity tolerance, Decreased endurance, Decreased range of motion, Decreased strength, Obesity, Impaired flexibility, Difficulty walking, Decreased safety awareness, Decreased balance  Visit Diagnosis: Gait difficulty  Repeated falls  Muscle weakness (generalized)     Problem List Patient Active Problem List   Diagnosis Date Noted  . 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 TIA (transient ischemic attack) 06/20/2017  . Primary osteoarthritis of both hips 06/20/2017  . Periodic limb movements of sleep 05/30/2017  . Shortness of breath 01/03/2017  . 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 OSA (obstructive sleep apnea) 07/01/2016  . Chronic  prostatitis/chronic pelvic pain syndrome 05/28/2016  . Migraine without aura and without status migrainosus, not intractable 05/11/2016  . Chronic anticoagulation 03/29/2016  . Encounter for monitoring opioid maintenance therapy 11/04/2015  . Dysphagia, neurologic 09/16/2015  . Numbness 09/16/2015  . Anti-cardiolipin antibody positive 09/01/2015  . Prothrombin gene mutation ([place]) 09/01/2015  . History of cerebral venous sinus thrombosis 06/27/2015  . Anemia, iron deficiency 10/02/2014  . Chronic thoracic back pain 07/24/2014  .  Bilateral occipital neuralgia 04/11/2014  . Hypothyroidism, unspecified 10/17/2013  . Type 2 diabetes, HbA1c goal < 7% (HCC) 10/17/2013  . Cervicogenic headache 07/04/2013  . Cervical spondylosis without myelopathy 11/16/2012  . Rotator cuff syndrome 11/13/2012  . Sinus congestion 11/07/2012  . Abdominal pain 09/28/2012  . Rectal bleeding 09/28/2012  . Depression 02/22/2012  . GERD (gastroesophageal reflux disease) 02/22/2012  . Essential hypertension 02/22/2012  . Obesity, unspecified 02/22/2012  . Neuromuscular disorder ([place]) 02/22/2012  . Pain in joint, pelvic region and thigh 01/17/2012  . Insomnia secondary to chronic pain 10/04/2011  . Right shoulder pain 10/04/2011  . Atypical facial pain 10/03/2011  . Chronic daily headache 10/03/2011  . Fibromyalgia 10/03/2011  . Hyperlipidemia, unspecified 10/03/2011   Pura Spice, PT, DPT # [phone] [place], SPT 11/07/2017, 4:02 PM  [facility] [address] [place], [place], [address] Phone: [phone]   Fax:  [phone]  Name: Christine Mcneil MRN: [id number] Date of Birth: 1965-08-10

## 2017-11-07 NOTE — Patient Instructions (Signed)
Access Code: [email or web address]  URL: [email or web address]  Date: 11/07/2017  Prepared by: Dorcas Carrow   Exercises  Ankle Inversion with Resistance - 10 reps - 2 sets - 1x daily - 7x weekly  Ankle Eversion with Resistance - 10 reps - 2 sets - 1x daily - 7x weekly  Seated Ankle Plantar Flexion with Resistance Loop - 10 reps - 2 sets - 1x daily - 7x weekly  Ankle Dorsiflexion with Resistance - 10 reps - 2 sets - 1x daily - 7x weekly  Sit to Stand - 10 reps - 1 sets - 1x daily - 7x weekly

## 2017-11-10 ENCOUNTER — Ambulatory Visit: Payer: Medicare Other

## 2017-11-10 DIAGNOSIS — R269 Unspecified abnormalities of gait and mobility: Secondary | ICD-10-CM | POA: Diagnosis not present

## 2017-11-10 DIAGNOSIS — M6281 Muscle weakness (generalized): Secondary | ICD-10-CM

## 2017-11-10 DIAGNOSIS — R296 Repeated falls: Secondary | ICD-10-CM

## 2017-11-10 NOTE — Therapy (Signed)
[facility] [address] [place], [place], [address] Phone: [phone]   Fax:  [phone]  Physical Therapy Treatment  Patient Details  Name: Christine Mcneil MRN: [id number] Date of Birth: 1965/06/02 Referring Provider: Dr. Jennings Books   Encounter Date: 11/10/2017  PT End of Session - 11/10/17 1530    Visit Number  6    Number of Visits  8    Date for PT Re-Evaluation  11/10/17    PT Start Time  4709    PT Stop Time  1527    PT Time Calculation (min)  58 min    Equipment Utilized During Treatment  Gait belt    Activity Tolerance  Patient tolerated treatment well    Behavior During Therapy  [facility] for tasks assessed/performed       Past Medical History:  Diagnosis Date  . Anxiety and depression   . Chronic pain   . Depression   . Diabetes mellitus without complication ([place])   . Diabetes mellitus, type II ([place])   . Hypertension   . Migraines   . Prothrombin gene mutation ([place])   . Thyroid disease   . TIA (transient ischemic attack) 05/27/2017    Past Surgical History:  Procedure Laterality Date  . ABDOMINAL HYSTERECTOMY    . CHOLECYSTECTOMY    . KNEE SURGERY Left   . ROTATOR CUFF REPAIR Right     There were no vitals filed for this visit.  Subjective Assessment - 11/10/17 1528    Subjective  Pt. reports not feeling well yesterday or today.  Pt. suspects food poisoning from restaurant yesterday caused her to be sick on her stomach.  Pt. is excited for therapy and reports to be doing HEP since last visit.    Pertinent History  Significant PMHx    Limitations  Standing;Walking;House hold activities    Patient Stated Goals  Increase LE strength/ balance/ independence with walking    Currently in Pain?  No/denies          Treatment:   There Ex:   Resisted BlueTB 4-way 2x15 2x10 Sit-to-stand Seated Hamstring Curl BlueTB 2x15 Seated B Marches 5# Ankle Weight 2x10 each Seated B LAQ 5# Ankle Weights 2x10  each Nustep for 12 min. B UE/LE (warm-up/not billed) Resisted walking in // bars with 2 BlackTB (Forward/ Back/ Lateral) x5 each   Neuro:   Static standing on Aerex pad 2x30sec Narrow BOS standing on Aerex pad 2x30sec Tandem stance on Aerex 2x30sec Single-leg standing on Airex pad 2x30sec Clockwise and counter-clockwise rotations on wobbleboard x10 each direction      PT Education - 11/10/17 1529    Education Details  Addition of Hamstring Curls with BlueTB and continue doing previously given HEP.    Person(s) Educated  Patient;Caregiver(s)    Methods  Explanation;Demonstration;Verbal cues    Comprehension  Verbalized understanding;Returned demonstration          PT Long Term Goals - 11/03/17 1602      PT LONG TERM GOAL #1   Title  Pt. will be independent with HEP to increase B LE muscle strength 1/2 muscle grade to improve standing tolerance/ gait.      Baseline  R LE strength grossly 4+/5 MMT except hip flexion 4/5 MMT. L LE strength grossly 4/5 MMT except quad 3/5/ hip flexion 3+/5 MMT.     Time  4    Period  Weeks    Status  On-going    Target Date  11/10/17      PT LONG TERM GOAL #2   Title  Pt. will increase FOTO from 32 to 40 to improve functional mobility.     Baseline  baseline FOTO 32 on 10/13/17    Time  4    Period  Weeks    Status  On-going    Target Date  11/10/17      PT LONG TERM GOAL #3   Title  Pt. will increase Berg balance score to >51/56 to decrease fall risk/ improve safety with gait.      Baseline  Berg 55/56 on 6/13    Time  4    Period  Weeks    Status  Achieved    Target Date  11/03/17      PT LONG TERM GOAL #4   Title  Pt. able to ambulate outside on grassy terrain with no LOB to promote independence with gait.      Baseline  increase fall risk on grass/ falls reported while gardening    Time  4    Period  Weeks    Status  New    Target Date  11/10/17            Plan - 11/10/17 1532    Clinical Impression Statement  Pt.  continues to progress in ther ex with increasing resistance during ambulation and challanging balance with ambulation.  Ankle stability is still primary concern and will be focal point in treatments going forward.  Pt. will continue HEP with addition of hamstring curls with BlueTB.    Clinical Presentation  Evolving    Clinical Decision Making  Moderate    Rehab Potential  Fair    PT Frequency  2x / week    PT Duration  4 weeks    PT Treatment/Interventions  ADLs/Self Care Home Management;Aquatic Therapy;Gait training;Moist Heat;Cryotherapy;Stair training;Functional mobility training;Therapeutic activities;Balance training;Therapeutic exercise;Patient/family education;Neuromuscular re-education;Manual techniques;Passive range of motion    PT Next Visit Plan  Progress HEP/ LE strengthening.  Encourage pt. to use SPC.  Assess hip.      PT Home Exercise Plan  see HEP       Patient will benefit from skilled therapeutic intervention in order to improve the following deficits and impairments:  Abnormal gait, Improper body mechanics, Pain, Postural dysfunction, Decreased coordination, Decreased activity tolerance, Decreased endurance, Decreased range of motion, Decreased strength, Obesity, Impaired flexibility, Difficulty walking, Decreased safety awareness, Decreased balance  Visit Diagnosis: Gait difficulty  Repeated falls  Muscle weakness (generalized)     Problem List Patient Active Problem List   Diagnosis Date Noted  . 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 TIA (transient ischemic attack) 06/20/2017  . Primary osteoarthritis of both hips 06/20/2017  . Periodic limb movements of sleep 05/30/2017  . Shortness of breath 01/03/2017  . 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 OSA (obstructive sleep apnea) 07/01/2016  . Chronic prostatitis/chronic pelvic pain syndrome 05/28/2016  . Migraine without aura and without status migrainosus, not intractable 05/11/2016  . Chronic anticoagulation  03/29/2016  . Encounter for monitoring opioid maintenance therapy 11/04/2015  . Dysphagia, neurologic 09/16/2015  . Numbness 09/16/2015  . Anti-cardiolipin antibody positive 09/01/2015  . Prothrombin gene mutation ([place]) 09/01/2015  . History of cerebral venous sinus thrombosis 06/27/2015  . Anemia, iron deficiency 10/02/2014  . Chronic thoracic back pain 07/24/2014  . Bilateral occipital neuralgia 04/11/2014  . Hypothyroidism, unspecified 10/17/2013  . Type 2 diabetes, HbA1c goal < 7% (HCC) 10/17/2013  . Cervicogenic headache 07/04/2013  .  Cervical spondylosis without myelopathy 11/16/2012  . Rotator cuff syndrome 11/13/2012  . Sinus congestion 11/07/2012  . Abdominal pain 09/28/2012  . Rectal bleeding 09/28/2012  . Depression 02/22/2012  . GERD (gastroesophageal reflux disease) 02/22/2012  . Essential hypertension 02/22/2012  . Obesity, unspecified 02/22/2012  . Neuromuscular disorder ([place]) 02/22/2012  . Pain in joint, pelvic region and thigh 01/17/2012  . Insomnia secondary to chronic pain 10/04/2011  . Right shoulder pain 10/04/2011  . Atypical facial pain 10/03/2011  . Chronic daily headache 10/03/2011  . Fibromyalgia 10/03/2011  . Hyperlipidemia, unspecified 10/03/2011   This entire session was performed under direct supervision and direction of a licensed therapist/therapist assistant . I have personally read, edited and approve of the note as written.   Gwenlyn Saran SPT Phillips Grout PT, DPT   Huprich,Jason, 11/11/2017, 10:57 PM  [facility] [address] [place], [place], [address] Phone: [phone]   Fax:  [phone]  Name: Christine Mcneil MRN: [id number] Date of Birth: June 28, 1965

## 2017-11-15 ENCOUNTER — Ambulatory Visit: Payer: Medicare Other

## 2017-11-15 ENCOUNTER — Encounter: Payer: Self-pay | Admitting: Physical Therapy

## 2017-11-15 VITALS — BP 123/82 | HR 76

## 2017-11-15 DIAGNOSIS — R269 Unspecified abnormalities of gait and mobility: Secondary | ICD-10-CM

## 2017-11-15 DIAGNOSIS — M6281 Muscle weakness (generalized): Secondary | ICD-10-CM

## 2017-11-15 DIAGNOSIS — R296 Repeated falls: Secondary | ICD-10-CM

## 2017-11-15 NOTE — Therapy (Signed)
[facility] [address] [place], [place], [address] Phone: [phone]   Fax:  [phone]  Physical Therapy Treatment  Patient Details  Name: Christine Mcneil MRN: [id number] Date of Birth: 1966/04/08 Referring Provider: Dr. Jennings Books   Encounter Date: 11/15/2017  PT End of Session - 11/15/17 1521    Visit Number  7    Number of Visits  8    Date for PT Re-Evaluation  11/10/17    PT Start Time  6269    PT Stop Time  1522    PT Time Calculation (min)  54 min    Equipment Utilized During Treatment  Gait belt    Activity Tolerance  Patient tolerated treatment well    Behavior During Therapy  [facility] for tasks assessed/performed       Past Medical History:  Diagnosis Date  . Anxiety and depression   . Chronic pain   . Depression   . Diabetes mellitus without complication ([place])   . Diabetes mellitus, type II ([place])   . Hypertension   . Migraines   . Prothrombin gene mutation ([place])   . Thyroid disease   . TIA (transient ischemic attack) 05/27/2017    Past Surgical History:  Procedure Laterality Date  . ABDOMINAL HYSTERECTOMY    . CHOLECYSTECTOMY    . KNEE SURGERY Left   . ROTATOR CUFF REPAIR Right     Vitals:   11/15/17 1717  BP: 123/82  Pulse: 76  SpO2: 99%    Subjective Assessment - 11/15/17 1438    Subjective  Pt. reports to be feeling sluggish after taking heart medication for tachycardia.  Pt. reports it feels like she has taken a valium and that she is "foggy".  Pt. reports to be independent with HEP over the weekend and was able to work in the garden.     Pertinent History  Significant PMHx    Limitations  Standing;Walking;House hold activities    Patient Stated Goals  Increase LE strength/ balance/ independence with walking    Currently in Pain?  No/denies        Treatment:   Ther-ex  Calf-stretch against wall Resisted BlueTB Ankle 4-way 2x15 2x15 Sit-to-stand Seated Hamstring Curl BlueTB 2x15 Seated  B Marches 10# Ankle Weight 2x10 each Seated B LAQ 10# Ankle Weights 2x10 each Resisted walking in // bars with 2 BlackTB (Forward/ Back/ Lateral) x10 each   Frequent rest breaks due to fatigue     PT Long Term Goals - 11/15/17 1537      PT LONG TERM GOAL #1   Title  Pt. will be independent with HEP to increase B LE muscle strength 1/2 muscle grade to improve standing tolerance/ gait.      Baseline  R LE strength grossly 4+/5 MMT except hip flexion 4/5 MMT. L LE strength grossly 4/5 MMT except quad 3/5/ hip flexion 3+/5 MMT.     Time  4    Period  Weeks    Status  On-going    Target Date  11/22/17      PT LONG TERM GOAL #2   Title  Pt. will increase FOTO from 32 to 40 to improve functional mobility.     Time  4    Period  Weeks      PT LONG TERM GOAL #4   Title  Pt. able to ambulate outside on grassy terrain with no LOB to promote independence with gait.  Baseline  increase fall risk on grass/ falls reported while gardening    Time  4    Period  Weeks    Status  New    Target Date  11/22/17            Plan - 11/15/17 1523    Clinical Impression Statement  Pt. is progressing well with ther ex and demonstrating increased strength during treatments.  Pt able to progress in resistance and amount of reps in ankle and hip exercises.  Pt. given BlueTB to do ankle exercises at home.      Clinical Presentation  Evolving    Clinical Decision Making  Moderate    Rehab Potential  Fair    PT Frequency  2x / week    PT Duration  4 weeks    PT Treatment/Interventions  ADLs/Self Care Home Management;Aquatic Therapy;Gait training;Moist Heat;Cryotherapy;Stair training;Functional mobility training;Therapeutic activities;Balance training;Therapeutic exercise;Patient/family education;Neuromuscular re-education;Manual techniques;Passive range of motion    PT Next Visit Plan  Ask about progressing to BlueTB with HEP and ankle exercises.    PT Home Exercise Plan  see HEP       Patient  will benefit from skilled therapeutic intervention in order to improve the following deficits and impairments:  Abnormal gait, Improper body mechanics, Pain, Postural dysfunction, Decreased coordination, Decreased activity tolerance, Decreased endurance, Decreased range of motion, Decreased strength, Obesity, Impaired flexibility, Difficulty walking, Decreased safety awareness, Decreased balance  Visit Diagnosis: Gait difficulty  Repeated falls  Muscle weakness (generalized)     Problem List Patient Active Problem List   Diagnosis Date Noted  . 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 TIA (transient ischemic attack) 06/20/2017  . Primary osteoarthritis of both hips 06/20/2017  . Periodic limb movements of sleep 05/30/2017  . Shortness of breath 01/03/2017  . 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 OSA (obstructive sleep apnea) 07/01/2016  . Chronic prostatitis/chronic pelvic pain syndrome 05/28/2016  . Migraine without aura and without status migrainosus, not intractable 05/11/2016  . Chronic anticoagulation 03/29/2016  . Encounter for monitoring opioid maintenance therapy 11/04/2015  . Dysphagia, neurologic 09/16/2015  . Numbness 09/16/2015  . Anti-cardiolipin antibody positive 09/01/2015  . Prothrombin gene mutation ([place]) 09/01/2015  . History of cerebral venous sinus thrombosis 06/27/2015  . Anemia, iron deficiency 10/02/2014  . Chronic thoracic back pain 07/24/2014  . Bilateral occipital neuralgia 04/11/2014  . Hypothyroidism, unspecified 10/17/2013  . Type 2 diabetes, HbA1c goal < 7% (HCC) 10/17/2013  . Cervicogenic headache 07/04/2013  . Cervical spondylosis without myelopathy 11/16/2012  . Rotator cuff syndrome 11/13/2012  . Sinus congestion 11/07/2012  . Abdominal pain 09/28/2012  . Rectal bleeding 09/28/2012  . Depression 02/22/2012  . GERD (gastroesophageal reflux disease) 02/22/2012  . Essential hypertension 02/22/2012  . Obesity, unspecified 02/22/2012  .  Neuromuscular disorder ([place]) 02/22/2012  . Pain in joint, pelvic region and thigh 01/17/2012  . Insomnia secondary to chronic pain 10/04/2011  . Right shoulder pain 10/04/2011  . Atypical facial pain 10/03/2011  . Chronic daily headache 10/03/2011  . Fibromyalgia 10/03/2011  . Hyperlipidemia, unspecified 10/03/2011    This entire session was performed under direct supervision and direction of a licensed therapist/therapist assistant . I have personally read, edited and approve of the note as written.   Gwenlyn Saran SPT Phillips Grout PT, DPT, GCS  Huprich,Jason 11/16/2017, 5:25 PM   [facility] [address] [place], [place], [address] Phone: [phone]   Fax:  [phone]  Name: Hilda Blades  DONDRA RHETT MRN: [id number] Date of Birth: 01-17-1966

## 2017-11-17 ENCOUNTER — Ambulatory Visit (INDEPENDENT_AMBULATORY_CARE_PROVIDER_SITE_OTHER): Payer: Medicare Other | Admitting: Licensed Clinical Social Worker

## 2017-11-17 DIAGNOSIS — F331 Major depressive disorder, recurrent, moderate: Secondary | ICD-10-CM

## 2017-11-21 ENCOUNTER — Encounter: Payer: Medicare Other | Admitting: Physical Therapy

## 2017-11-23 ENCOUNTER — Encounter: Payer: Medicare Other | Admitting: Physical Therapy

## 2017-11-23 NOTE — Progress Notes (Signed)
   THERAPIST PROGRESS NOTE  Session Time: 33min  Participation Level: Active  Behavioral Response: CasualAlertEuthymic  Type of Therapy: Individual Therapy  Treatment Goals addressed: Coping  Interventions: CBT and Motivational Interviewing  Summary: Christine Mcneil is a 52 y.o. female who presents with a reduction in symptoms.  Patient reports a "happy" mood.  Patient states that she has been able to socialize more and have positive thoughts.  Patient reports taht her best friend moved in the home with she and her boyfriend.  Discussion of the expectations of each house member and the roles of each person.  Explored with Patient unrealistic expectations and how to communicate her thoughts and feelings to each person.  Role played how to communicate her thoughts.  Suicidal/Homicidal: No  Plan: Return again in 2 weeks.  Diagnosis: Axis I: Depression    Axis II: No diagnosis    [place], LCSW 11/17/2017

## 2017-11-29 ENCOUNTER — Encounter: Payer: Medicare Other | Admitting: Physical Therapy

## 2017-12-06 ENCOUNTER — Encounter: Payer: Self-pay | Admitting: Physical Therapy

## 2017-12-06 ENCOUNTER — Ambulatory Visit: Payer: Medicare Other | Attending: Neurology | Admitting: Physical Therapy

## 2017-12-06 DIAGNOSIS — R269 Unspecified abnormalities of gait and mobility: Secondary | ICD-10-CM | POA: Diagnosis present

## 2017-12-06 DIAGNOSIS — R296 Repeated falls: Secondary | ICD-10-CM

## 2017-12-06 DIAGNOSIS — M6281 Muscle weakness (generalized): Secondary | ICD-10-CM

## 2017-12-06 NOTE — Therapy (Signed)
[facility] [address] [place], [place], [address] Phone: [phone]   Fax:  [phone]  Physical Therapy Treatment  Patient Details  Name: Christine Mcneil MRN: [id number] Date of Birth: 09/27/1965 Referring Provider: Dr. Jennings Books   Encounter Date: 12/06/2017  PT End of Session - 12/06/17 1354    Visit Number  8    Number of Visits  16    Date for PT Re-Evaluation  01/03/18    PT Start Time  1336    PT Stop Time  1428    PT Time Calculation (min)  52 min    Equipment Utilized During Treatment  Gait belt    Activity Tolerance  Patient tolerated treatment well    Behavior During Therapy  [facility] for tasks assessed/performed       Past Medical History:  Diagnosis Date  . Anxiety and depression   . Chronic pain   . Depression   . Diabetes mellitus without complication ([place])   . Diabetes mellitus, type II ([place])   . Hypertension   . Migraines   . Prothrombin gene mutation ([place])   . Thyroid disease   . TIA (transient ischemic attack) 05/27/2017    Past Surgical History:  Procedure Laterality Date  . ABDOMINAL HYSTERECTOMY    . CHOLECYSTECTOMY    . KNEE SURGERY Left   . ROTATOR CUFF REPAIR Right     There were no vitals filed for this visit.  Subjective Assessment - 12/06/17 1350    Subjective  Pt. reports to have fallen at 2 am this morning.  Pt. reports that she tripped over her own feet onto her right side.  Pt. reports that she is tired from being with mother who has been in the hospital for 2 weeks prior to today.     Pertinent History  Significant PMHx    Limitations  Standing;Walking;House hold activities    Patient Stated Goals  Increase LE strength/ balance/ independence with walking    Currently in Pain?  Yes    Pain Score  2     Pain Location  Hip    Pain Orientation  Right    Pain Descriptors / Indicators  Tender;Sharp    Pain Type  Acute pain    Aggravating Factors   Sitting/ walking    Pain Relieving  Factors  Biofreeze    Multiple Pain Sites  No       Treatment:   Ther-ex  Calf-stretch against stairs Resisted GTB Ankle 4-way 2x15 3x10 Sit-to-stand Seated Hamstring Curl GTB 2x15 Standing SLS <5 sec B x2 Ascending/Descending Stairs x5 with no UE support  Frequent rest breaks due to fatigue  [facility] PT Assessment - 12/07/17 0001      Assessment   Medical Diagnosis  Fall/ Gait difficulty    Referring Provider  Dr. Jennings Books    Onset Date/Surgical Date  05/24/17      Prior Function   Level of Independence  Independent             PT Long Term Goals - 12/06/17 1356      PT LONG TERM GOAL #1   Title  Pt. will be independent with HEP to increase B LE muscle strength 1/2 muscle grade to improve standing tolerance/ gait.      Baseline  R/L Hip Flex: 4/4+; R/L Knee Ext: 5/5;     Time  4    Period  Weeks    Status  Achieved    Target Date  11/22/17      PT LONG TERM GOAL #2   Title  Pt. will increase FOTO from 32 to 40 to improve functional mobility.     Baseline  FOTO: 40 12/06/17    Time  4    Period  Weeks    Status  Achieved    Target Date  11/10/17      PT LONG TERM GOAL #3   Title  Pt. will increase Berg balance score to >51/56 to decrease fall risk/ improve safety with gait.      Baseline  Berg 55/56 on 6/13    Time  4    Period  Weeks    Status  Achieved    Target Date  11/03/17      PT LONG TERM GOAL #4   Title  Pt. able to ambulate outside on grassy terrain with no LOB to promote independence with gait.      Baseline  increase fall risk on grass/ falls reported while gardening    Time  4    Period  Weeks    Status  Achieved    Target Date  11/22/17      PT LONG TERM GOAL #5   Title  Pt. will be able to perform SLS >30sec in B LE with no LOB to ensure ankle stability needed for ascending/descending stairs.    Baseline  Pt. able to hold SLS <5 sec without LOB.    Time  4    Period  Weeks    Status  New    Target Date  12/06/17      Additional  Long Term Goals   Additional Long Term Goals  Yes      PT LONG TERM GOAL #6   Title  Pt. will improve FOTO to 48 to signify an increase in pain free functional mobility.    Baseline  FOTO: 40 on 12/06/17    Time  4    Period  Weeks    Status  New    Target Date  01/03/18      PT LONG TERM GOAL #7   Title  Pt. will report no falls in the next 4 weeks to demonstrate increased balance/ coordination with movement patterns.    Baseline  Reports a fall occurred 7/16    Time  4    Period  Weeks    Status  New    Target Date  01/03/18          Plan - 12/06/17 1430    Clinical Impression Statement  Pt. has made significant improvements with balance and strengthening over the past several weeks.  With pt's. mother being in the hospital for 2-weeks, pt. has had some setbacks due to not being able to exercise as frequently.  Pt. is exhibiting increased ankle stability that is concerning with stability when walking and ascending stairs.  Pt. will continue to benefit from increased HEP and focusing on balance and exercises needed for ankle stability for ambulation.      Clinical Presentation  Evolving    Clinical Decision Making  Moderate    Rehab Potential  Fair    PT Frequency  1x / week    PT Duration  4 weeks    PT Treatment/Interventions  ADLs/Self Care Home Management;Aquatic Therapy;Gait training;Moist Heat;Cryotherapy;Stair training;Functional mobility training;Therapeutic activities;Balance training;Therapeutic exercise;Patient/family education;Neuromuscular re-education;Manual techniques;Passive range of motion    PT Next Visit Plan  Assess HEP  and ensure compliance.    PT Home Exercise Plan  see HEP       Patient will benefit from skilled therapeutic intervention in order to improve the following deficits and impairments:  Abnormal gait, Improper body mechanics, Pain, Postural dysfunction, Decreased coordination, Decreased activity tolerance, Decreased endurance, Decreased range of  motion, Decreased strength, Obesity, Impaired flexibility, Difficulty walking, Decreased safety awareness, Decreased balance  Visit Diagnosis: Gait difficulty  Repeated falls  Muscle weakness (generalized)     Problem List Patient Active Problem List   Diagnosis Date Noted  . 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 TIA (transient ischemic attack) 06/20/2017  . Primary osteoarthritis of both hips 06/20/2017  . Periodic limb movements of sleep 05/30/2017  . Shortness of breath 01/03/2017  . 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 OSA (obstructive sleep apnea) 07/01/2016  . Chronic prostatitis/chronic pelvic pain syndrome 05/28/2016  . Migraine without aura and without status migrainosus, not intractable 05/11/2016  . Chronic anticoagulation 03/29/2016  . Encounter for monitoring opioid maintenance therapy 11/04/2015  . Dysphagia, neurologic 09/16/2015  . Numbness 09/16/2015  . Anti-cardiolipin antibody positive 09/01/2015  . Prothrombin gene mutation ([place]) 09/01/2015  . History of cerebral venous sinus thrombosis 06/27/2015  . Anemia, iron deficiency 10/02/2014  . Chronic thoracic back pain 07/24/2014  . Bilateral occipital neuralgia 04/11/2014  . Hypothyroidism, unspecified 10/17/2013  . Type 2 diabetes, HbA1c goal < 7% (HCC) 10/17/2013  . Cervicogenic headache 07/04/2013  . Cervical spondylosis without myelopathy 11/16/2012  . Rotator cuff syndrome 11/13/2012  . Sinus congestion 11/07/2012  . Abdominal pain 09/28/2012  . Rectal bleeding 09/28/2012  . Depression 02/22/2012  . GERD (gastroesophageal reflux disease) 02/22/2012  . Essential hypertension 02/22/2012  . Obesity, unspecified 02/22/2012  . Neuromuscular disorder ([place]) 02/22/2012  . Pain in joint, pelvic region and thigh 01/17/2012  . Insomnia secondary to chronic pain 10/04/2011  . Right shoulder pain 10/04/2011  . Atypical facial pain 10/03/2011  . Chronic daily headache 10/03/2011  . Fibromyalgia 10/03/2011   . Hyperlipidemia, unspecified 10/03/2011   Pura Spice, PT, DPT # [id number] [place], SPT 12/07/2017, 4:26 PM  [facility] [place] [address] [place], [place], [address] Phone: [phone]   Fax:  [phone]  Name: Christine Mcneil MRN: [id number] Date of Birth: May 18, 1966

## 2017-12-08 ENCOUNTER — Encounter: Payer: Medicare Other | Admitting: Physical Therapy

## 2017-12-13 ENCOUNTER — Ambulatory Visit: Payer: Medicare Other

## 2017-12-13 DIAGNOSIS — R269 Unspecified abnormalities of gait and mobility: Secondary | ICD-10-CM

## 2017-12-13 DIAGNOSIS — M6281 Muscle weakness (generalized): Secondary | ICD-10-CM

## 2017-12-13 DIAGNOSIS — R296 Repeated falls: Secondary | ICD-10-CM

## 2017-12-14 NOTE — Therapy (Cosign Needed)
[facility] [address] [place], [place], [address] Phone: [phone]   Fax:  [phone]  Physical Therapy Treatment  Patient Details  Name: Christine Mcneil MRN: [id number] Date of Birth: 02-25-1966 Referring Provider: Dr. Jennings Books   Encounter Date: 12/13/2017  PT End of Session - 12/14/17 1146    Visit Number  9    Number of Visits  16    Date for PT Re-Evaluation  01/03/18    PT Start Time  1416    PT Stop Time  1501    PT Time Calculation (min)  45 min    Equipment Utilized During Treatment  Gait belt    Activity Tolerance  Patient tolerated treatment well    Behavior During Therapy  [facility] for tasks assessed/performed       Past Medical History:  Diagnosis Date  . Anxiety and depression   . Chronic pain   . Depression   . Diabetes mellitus without complication ([place])   . Diabetes mellitus, type II ([place])   . Hypertension   . Migraines   . Prothrombin gene mutation ([place])   . Thyroid disease   . TIA (transient ischemic attack) 05/27/2017    Past Surgical History:  Procedure Laterality Date  . ABDOMINAL HYSTERECTOMY    . CHOLECYSTECTOMY    . KNEE SURGERY Left   . ROTATOR CUFF REPAIR Right     There were no vitals filed for this visit.  Subjective Assessment - 12/14/17 1140    Subjective  Pt. reports that she has not fallen since last visit, however is very sore.  Pt walked into clinic with antalgic gait on R side.  Pt. reports no new complaints from last treatment session.    Pertinent History  Significant PMHx    Limitations  Standing;Walking;House hold activities    Patient Stated Goals  Increase LE strength/ balance/ independence with walking    Currently in Pain?  Yes    Pain Score  2     Pain Location  Hip    Pain Orientation  Right    Pain Descriptors / Indicators  Tender;Sharp    Pain Type  Acute pain    Pain Onset  More than a month ago    Pain Frequency  Intermittent           TREATMENT  There Ex: Resisted GTB 4-way 2x15 Seated Hamstring Curl GTB 2x15 Seated B Marches 5# Ankle Weight 2x10 each Seated B LAQ 5# Ankle Weights 2x10 each Nustep for 10 min. B UE/LE(warm-up/not billed) Resisted walking in // bars with 1 BlackTB (Forward/ Back/ Lateral) x5 each  Neuro: Static standing on BOSU ball 3x30 sec Front/Back Weight shifts on BOSU ball x10 each direction Lateral Weight Shifts on BOSU ball x10 each side Clockwise/ counter-clockwise rotations on BOSU ball x10 each direction B Clockwise and counter-clockwise rotations on BAPS Board x10 each direction    PT Education - 12/14/17 1625    Education Details  Exercise form/technique    Person(s) Educated  Patient    Methods  Explanation    Comprehension  Verbalized understanding          PT Long Term Goals - 12/06/17 1356      PT LONG TERM GOAL #1   Title  Pt. will be independent with HEP to increase B LE muscle strength 1/2 muscle grade to improve standing tolerance/ gait.      Baseline  R/L Hip Flex: 4/4+; R/L Knee  Ext: 5/5;     Time  4    Period  Weeks    Status  Achieved    Target Date  11/22/17      PT LONG TERM GOAL #2   Title  Pt. will increase FOTO from 32 to 40 to improve functional mobility.     Baseline  FOTO: 40 12/06/17    Time  4    Period  Weeks    Status  Achieved    Target Date  11/10/17      PT LONG TERM GOAL #3   Title  Pt. will increase Berg balance score to >51/56 to decrease fall risk/ improve safety with gait.      Baseline  Berg 55/56 on 6/13    Time  4    Period  Weeks    Status  Achieved    Target Date  11/03/17      PT LONG TERM GOAL #4   Title  Pt. able to ambulate outside on grassy terrain with no LOB to promote independence with gait.      Baseline  increase fall risk on grass/ falls reported while gardening    Time  4    Period  Weeks    Status  Achieved    Target Date  11/22/17      PT LONG TERM GOAL #5   Title  Pt. will be able to perform  SLS >30sec in B LE with no LOB to ensure ankle stability needed for ascending/descending stairs.    Baseline  Pt. able to hold SLS <5 sec without LOB.    Time  4    Period  Weeks    Status  New    Target Date  12/06/17      Additional Long Term Goals   Additional Long Term Goals  Yes      PT LONG TERM GOAL #6   Title  Pt. will improve FOTO to 48 to signify an increase in pain free functional mobility.    Baseline  FOTO: 40 on 12/06/17    Time  4    Period  Weeks    Status  New    Target Date  01/03/18      PT LONG TERM GOAL #7   Title  Pt. will report no falls in the next 4 weeks to demonstrate increased balance/ coordination with movement patterns.    Baseline  Reports a fall occurred 7/16    Time  4    Period  Weeks    Status  New    Target Date  01/03/18            Plan - 12/14/17 1149    Clinical Impression Statement  Pt. continues to demonstrate increase ankle strength and balance from prior sessions and increased HEP demands.  Pt. will continue to benefit from strengthening in ankle to promote stability needed for ambulation without LOB and falls.    Clinical Presentation  Evolving    Clinical Decision Making  Moderate    Rehab Potential  Fair    PT Frequency  1x / week    PT Duration  4 weeks    PT Treatment/Interventions  ADLs/Self Care Home Management;Aquatic Therapy;Gait training;Moist Heat;Cryotherapy;Stair training;Functional mobility training;Therapeutic activities;Balance training;Therapeutic exercise;Patient/family education;Neuromuscular re-education;Manual techniques;Passive range of motion    PT Next Visit Plan  Assess HEP and ensure compliance.    PT Home Exercise Plan  see HEP       Patient  will benefit from skilled therapeutic intervention in order to improve the following deficits and impairments:  Abnormal gait, Improper body mechanics, Pain, Postural dysfunction, Decreased coordination, Decreased activity tolerance, Decreased endurance, Decreased  range of motion, Decreased strength, Obesity, Impaired flexibility, Difficulty walking, Decreased safety awareness, Decreased balance  Visit Diagnosis: Gait difficulty  Repeated falls  Muscle weakness (generalized)     Problem List Patient Active Problem List   Diagnosis Date Noted  . 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 TIA (transient ischemic attack) 06/20/2017  . Primary osteoarthritis of both hips 06/20/2017  . Periodic limb movements of sleep 05/30/2017  . Shortness of breath 01/03/2017  . 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 OSA (obstructive sleep apnea) 07/01/2016  . Chronic prostatitis/chronic pelvic pain syndrome 05/28/2016  . Migraine without aura and without status migrainosus, not intractable 05/11/2016  . Chronic anticoagulation 03/29/2016  . Encounter for monitoring opioid maintenance therapy 11/04/2015  . Dysphagia, neurologic 09/16/2015  . Numbness 09/16/2015  . Anti-cardiolipin antibody positive 09/01/2015  . Prothrombin gene mutation ([place]) 09/01/2015  . History of cerebral venous sinus thrombosis 06/27/2015  . Anemia, iron deficiency 10/02/2014  . Chronic thoracic back pain 07/24/2014  . Bilateral occipital neuralgia 04/11/2014  . Hypothyroidism, unspecified 10/17/2013  . Type 2 diabetes, HbA1c goal < 7% (HCC) 10/17/2013  . Cervicogenic headache 07/04/2013  . Cervical spondylosis without myelopathy 11/16/2012  . Rotator cuff syndrome 11/13/2012  . Sinus congestion 11/07/2012  . Abdominal pain 09/28/2012  . Rectal bleeding 09/28/2012  . Depression 02/22/2012  . GERD (gastroesophageal reflux disease) 02/22/2012  . Essential hypertension 02/22/2012  . Obesity, unspecified 02/22/2012  . Neuromuscular disorder ([place]) 02/22/2012  . Pain in joint, pelvic region and thigh 01/17/2012  . Insomnia secondary to chronic pain 10/04/2011  . Right shoulder pain 10/04/2011  . Atypical facial pain 10/03/2011  . Chronic daily headache 10/03/2011  . Fibromyalgia  10/03/2011  . Hyperlipidemia, unspecified 10/03/2011   This entire session was performed under direct supervision and direction of a licensed therapist/therapist assistant . I have personally read, edited and approve of the note as written.   Lyndel Safe Huprich PT, DPT, GCS  Huprich,Jason 12/14/2017, 4:28 PM  [facility] [address] [place], [place], [address] Phone: [phone]   Fax:  [phone]  Name: MARSELLA SUMAN MRN: [id number] Date of Birth: Sep 19, 1965

## 2017-12-15 ENCOUNTER — Encounter: Payer: Medicare Other | Admitting: Physical Therapy

## 2017-12-20 ENCOUNTER — Ambulatory Visit: Payer: Medicare Other | Admitting: Physical Therapy

## 2017-12-20 ENCOUNTER — Encounter: Payer: Self-pay | Admitting: Physical Therapy

## 2017-12-20 DIAGNOSIS — R269 Unspecified abnormalities of gait and mobility: Secondary | ICD-10-CM | POA: Diagnosis not present

## 2017-12-20 DIAGNOSIS — M6281 Muscle weakness (generalized): Secondary | ICD-10-CM

## 2017-12-20 DIAGNOSIS — R296 Repeated falls: Secondary | ICD-10-CM

## 2017-12-20 NOTE — Therapy (Signed)
[place] [facility] [address] [place], [place], [address] Phone: [phone]   Fax:  [phone]  Physical Therapy Treatment  Patient Details  Name: Christine Mcneil MRN: [id number] Date of Birth: [birth year] Referring Provider: Dr. Jennings Books   Encounter Date: 12/20/2017  PT End of Session - 12/20/17 1616    Visit Number  10    Number of Visits  16    Date for PT Re-Evaluation  01/03/18    PT Start Time  6811    PT Stop Time  1432    PT Time Calculation (min)  50 min    Equipment Utilized During Treatment  Gait belt    Activity Tolerance  Patient tolerated treatment well    Behavior During Therapy  [facility] for tasks assessed/performed       Past Medical History:  Diagnosis Date  . Anxiety and depression   . Chronic pain   . Depression   . Diabetes mellitus without complication ([place])   . Diabetes mellitus, type II ([place])   . Hypertension   . Migraines   . Prothrombin gene mutation ([place])   . Thyroid disease   . TIA (transient ischemic attack) 05/27/2017    Past Surgical History:  Procedure Laterality Date  . ABDOMINAL HYSTERECTOMY    . CHOLECYSTECTOMY    . KNEE SURGERY Left   . ROTATOR CUFF REPAIR Right     There were no vitals filed for this visit.  Subjective Assessment - 12/20/17 1359    Subjective  Pt. reports no falls since last visit however has been sore in B LE, with slight increase in R hip.  Pt. continues to walk with antalgic gai on R side with decreased step time on R LE.      Pertinent History  Significant PMHx    Limitations  Standing;Walking;House hold activities    Patient Stated Goals  Increase LE strength/ balance/ independence with walking    Currently in Pain?  Yes    Pain Score  4     Pain Location  Hip    Pain Orientation  Right    Pain Descriptors / Indicators  Tender;Sharp    Pain Type  Acute pain    Pain Onset  More than a month ago         TREATMENT  There Ex: Nustep for 10 min. B  UE/LE(warm-up/not billed) Resisted walking in // bars with 2 BlackTB (Forward/ Back/ Lateral) x5 each Seated B Ankle 4-Way with GTB 2x10 each LAD for pain in R Hip (pt. Reports decreased pain)  Neuro:  Static Stance on Airex 4x30 sec Tandem Stance on Airex 4x30 sec SLS Stance on Airex 4x30 sec Static Stance with eyes closed on Airex 4x30 sec Tandem Stance with eyes closed on Airex 4x30 sec SLS Stance on with eyes closed Airex 4x30 sec B Clockwise and counter-clockwise rotations on BAPS Board x10 each direction     PT Long Term Goals - 12/06/17 1356      PT LONG TERM GOAL #1   Title  Pt. will be independent with HEP to increase B LE muscle strength 1/2 muscle grade to improve standing tolerance/ gait.      Baseline  R/L Hip Flex: 4/4+; R/L Knee Ext: 5/5;     Time  4    Period  Weeks    Status  Achieved    Target Date  11/22/17      PT LONG TERM GOAL #2  Title  Pt. will increase FOTO from 32 to 40 to improve functional mobility.     Baseline  FOTO: 40 12/06/17    Time  4    Period  Weeks    Status  Achieved    Target Date  11/10/17      PT LONG TERM GOAL #3   Title  Pt. will increase Berg balance score to >51/56 to decrease fall risk/ improve safety with gait.      Baseline  Berg 55/56 on 6/13    Time  4    Period  Weeks    Status  Achieved    Target Date  11/03/17      PT LONG TERM GOAL #4   Title  Pt. able to ambulate outside on grassy terrain with no LOB to promote independence with gait.      Baseline  increase fall risk on grass/ falls reported while gardening    Time  4    Period  Weeks    Status  Achieved    Target Date  11/22/17      PT LONG TERM GOAL #5   Title  Pt. will be able to perform SLS >30sec in B LE with no LOB to ensure ankle stability needed for ascending/descending stairs.    Baseline  Pt. able to hold SLS <5 sec without LOB.    Time  4    Period  Weeks    Status  New    Target Date  12/06/17      Additional Long Term Goals    Additional Long Term Goals  Yes      PT LONG TERM GOAL #6   Title  Pt. will improve FOTO to 48 to signify an increase in pain free functional mobility.    Baseline  FOTO: 40 on 12/06/17    Time  4    Period  Weeks    Status  New    Target Date  01/03/18      PT LONG TERM GOAL #7   Title  Pt. will report no falls in the next 4 weeks to demonstrate increased balance/ coordination with movement patterns.    Baseline  Reports a fall occurred 7/16    Time  4    Period  Weeks    Status  New    Target Date  01/03/18         Plan - 12/20/17 1619    Clinical Impression Statement  Pt. performed well with ther ex today and continues to demonstrate increased strength in B LE and balance.  Pt. reports increased pain upon arrival to clinic, however reports decreased pain after treatment and leaving facility.  Pt. will continue to benefit from skilled therapy for balance and ankle stability along with compliance with HEP at home.      Clinical Presentation  Evolving    Clinical Decision Making  Moderate    Rehab Potential  Fair    PT Frequency  1x / week    PT Duration  4 weeks    PT Treatment/Interventions  ADLs/Self Care Home Management;Aquatic Therapy;Gait training;Moist Heat;Cryotherapy;Stair training;Functional mobility training;Therapeutic activities;Balance training;Therapeutic exercise;Patient/family education;Neuromuscular re-education;Manual techniques;Passive range of motion    PT Next Visit Plan  Assess compliance of HEP.    PT Home Exercise Plan  see HEP       Patient will benefit from skilled therapeutic intervention in order to improve the following deficits and impairments:  Abnormal gait, Improper body mechanics,  Pain, Postural dysfunction, Decreased coordination, Decreased activity tolerance, Decreased endurance, Decreased range of motion, Decreased strength, Obesity, Impaired flexibility, Difficulty walking, Decreased safety awareness, Decreased balance  Visit Diagnosis: Gait  difficulty  Repeated falls  Muscle weakness (generalized)     Problem List Patient Active Problem List   Diagnosis Date Noted  . 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 TIA (transient ischemic attack) 06/20/2017  . Primary osteoarthritis of both hips 06/20/2017  . Periodic limb movements of sleep 05/30/2017  . Shortness of breath 01/03/2017  . 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 OSA (obstructive sleep apnea) 07/01/2016  . Chronic prostatitis/chronic pelvic pain syndrome 05/28/2016  . Migraine without aura and without status migrainosus, not intractable 05/11/2016  . Chronic anticoagulation 03/29/2016  . Encounter for monitoring opioid maintenance therapy 11/04/2015  . Dysphagia, neurologic 09/16/2015  . Numbness 09/16/2015  . Anti-cardiolipin antibody positive 09/01/2015  . Prothrombin gene mutation ([place]) 09/01/2015  . History of cerebral venous sinus thrombosis 06/27/2015  . Anemia, iron deficiency 10/02/2014  . Chronic thoracic back pain 07/24/2014  . Bilateral occipital neuralgia 04/11/2014  . Hypothyroidism, unspecified 10/17/2013  . Type 2 diabetes, HbA1c goal < 7% (HCC) 10/17/2013  . Cervicogenic headache 07/04/2013  . Cervical spondylosis without myelopathy 11/16/2012  . Rotator cuff syndrome 11/13/2012  . Sinus congestion 11/07/2012  . Abdominal pain 09/28/2012  . Rectal bleeding 09/28/2012  . Depression 02/22/2012  . GERD (gastroesophageal reflux disease) 02/22/2012  . Essential hypertension 02/22/2012  . Obesity, unspecified 02/22/2012  . Neuromuscular disorder ([place]) 02/22/2012  . Pain in joint, pelvic region and thigh 01/17/2012  . Insomnia secondary to chronic pain 10/04/2011  . Right shoulder pain 10/04/2011  . Atypical facial pain 10/03/2011  . Chronic daily headache 10/03/2011  . Fibromyalgia 10/03/2011  . Hyperlipidemia, unspecified 10/03/2011   Pura Spice, PT, DPT # [id number] [place], SPT 12/21/2017, 10:34 AM  [place] [facility] [address] [place], [place], [address] Phone: [phone]   Fax:  [phone]  Name: Christine Mcneil MRN: [id number] Date of Birth: Oct 05, 1965

## 2017-12-26 ENCOUNTER — Ambulatory Visit: Payer: Medicare Other | Attending: Neurology | Admitting: Physical Therapy

## 2017-12-26 ENCOUNTER — Ambulatory Visit: Payer: Medicare Other | Admitting: Licensed Clinical Social Worker

## 2017-12-26 DIAGNOSIS — R296 Repeated falls: Secondary | ICD-10-CM | POA: Diagnosis present

## 2017-12-26 DIAGNOSIS — R269 Unspecified abnormalities of gait and mobility: Secondary | ICD-10-CM | POA: Diagnosis present

## 2017-12-26 DIAGNOSIS — M6281 Muscle weakness (generalized): Secondary | ICD-10-CM | POA: Diagnosis present

## 2017-12-26 NOTE — Therapy (Signed)
[facility] [address] [place], [place], [address] Phone: [phone]   Fax:  [phone]  Physical Therapy Treatment  Patient Details  Name: Christine Mcneil MRN: [id number] Date of Birth: [birth year] Referring Provider: Dr. Jennings Books   Encounter Date: 12/26/2017  PT End of Session - 12/26/17 1911    Visit Number  11    Number of Visits  16    Date for PT Re-Evaluation  01/03/18    PT Start Time  0973    PT Stop Time  1559    PT Time Calculation (min)  44 min    Equipment Utilized During Treatment  Gait belt    Activity Tolerance  Patient tolerated treatment well    Behavior During Therapy  [facility] for tasks assessed/performed       Past Medical History:  Diagnosis Date  . Anxiety and depression   . Chronic pain   . Depression   . Diabetes mellitus without complication ([place])   . Diabetes mellitus, type II ([place])   . Hypertension   . Migraines   . Prothrombin gene mutation ([place])   . Thyroid disease   . TIA (transient ischemic attack) 05/27/2017    Past Surgical History:  Procedure Laterality Date  . ABDOMINAL HYSTERECTOMY    . CHOLECYSTECTOMY    . KNEE SURGERY Left   . ROTATOR CUFF REPAIR Right     There were no vitals filed for this visit.  Subjective Assessment - 12/26/17 1909    Subjective  Pt. reports no new complaints since last visit.  Pt. is excited for therapy and is walking without antalgic gait today.    Pertinent History  Significant PMHx    Limitations  Standing;Walking;House hold activities    Patient Stated Goals  Increase LE strength/ balance/ independence with walking    Currently in Pain?  Yes    Pain Score  3     Pain Location  Hip    Pain Orientation  Right    Pain Descriptors / Indicators  Tender;Sharp    Pain Type  Acute pain    Pain Onset  More than a month ago    Pain Frequency  Intermittent           TREATMENT  There Ex: Resisted walking in // bars with2BlackTB (Forward/ Back/  Lateral) x10 each  Pt. Received constant verbal cueing for heel-strike and controlling weight when resistance was decreasing Resisted B SLS in // bars with 2 BlackTB at fully extended length 10 sec hold x5 each Seated B Ankle 4-Way with BlueTB 2x15 each    Neuro:  Walking on Airex Balance Beam Forward/ Lateralx10 each BClockwise and counter-clockwise rotations onBAPS Boardx10 each direction       PT Education - 12/26/17 1910    Education Details  Pt. instructed to progress back to BlueTB for ankle stability exercises in HEP.    Person(s) Educated  Patient    Methods  Explanation;Demonstration;Verbal cues    Comprehension  Verbalized understanding;Returned demonstration          PT Long Term Goals - 12/06/17 1356      PT LONG TERM GOAL #1   Title  Pt. will be independent with HEP to increase B LE muscle strength 1/2 muscle grade to improve standing tolerance/ gait.      Baseline  R/L Hip Flex: 4/4+; R/L Knee Ext: 5/5;     Time  4    Period  Weeks  Status  Achieved    Target Date  11/22/17      PT LONG TERM GOAL #2   Title  Pt. will increase FOTO from 32 to 40 to improve functional mobility.     Baseline  FOTO: 40 12/06/17    Time  4    Period  Weeks    Status  Achieved    Target Date  11/10/17      PT LONG TERM GOAL #3   Title  Pt. will increase Berg balance score to >51/56 to decrease fall risk/ improve safety with gait.      Baseline  Berg 55/56 on 6/13    Time  4    Period  Weeks    Status  Achieved    Target Date  11/03/17      PT LONG TERM GOAL #4   Title  Pt. able to ambulate outside on grassy terrain with no LOB to promote independence with gait.      Baseline  increase fall risk on grass/ falls reported while gardening    Time  4    Period  Weeks    Status  Achieved    Target Date  11/22/17      PT LONG TERM GOAL #5   Title  Pt. will be able to perform SLS >30sec in B LE with no LOB to ensure ankle stability needed for ascending/descending  stairs.    Baseline  Pt. able to hold SLS <5 sec without LOB.    Time  4    Period  Weeks    Status  New    Target Date  12/06/17      Additional Long Term Goals   Additional Long Term Goals  Yes      PT LONG TERM GOAL #6   Title  Pt. will improve FOTO to 48 to signify an increase in pain free functional mobility.    Baseline  FOTO: 40 on 12/06/17    Time  4    Period  Weeks    Status  New    Target Date  01/03/18      PT LONG TERM GOAL #7   Title  Pt. will report no falls in the next 4 weeks to demonstrate increased balance/ coordination with movement patterns.    Baseline  Reports a fall occurred 7/16    Time  4    Period  Weeks    Status  New    Target Date  01/03/18            Plan - 12/26/17 1912    Clinical Impression Statement  Pt. tolerated increased ankle stability exercises with increased resistance today.  Pt. is now performing with BlueTB and is performing well.  Pt. still lacks strength with resisted dorsiflexion exercises however is making significant improvements since hiatus from therapy due to family circumstances.  Pt. will continue with ankle stability exercises in order to increase strength to decrease fall risk.    Clinical Decision Making  Moderate    Rehab Potential  Fair    PT Frequency  1x / week    PT Duration  4 weeks    PT Treatment/Interventions  ADLs/Self Care Home Management;Aquatic Therapy;Gait training;Moist Heat;Cryotherapy;Stair training;Functional mobility training;Therapeutic activities;Balance training;Therapeutic exercise;Patient/family education;Neuromuscular re-education;Manual techniques;Passive range of motion    PT Next Visit Plan  Assess compliance of HEP.    PT Home Exercise Plan  see HEP (revised with BlueTB)  Patient will benefit from skilled therapeutic intervention in order to improve the following deficits and impairments:  Abnormal gait, Improper body mechanics, Pain, Postural dysfunction, Decreased coordination,  Decreased activity tolerance, Decreased endurance, Decreased range of motion, Decreased strength, Obesity, Impaired flexibility, Difficulty walking, Decreased safety awareness, Decreased balance  Visit Diagnosis: Gait difficulty  Repeated falls  Muscle weakness (generalized)     Problem List Patient Active Problem List   Diagnosis Date Noted  . 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 TIA (transient ischemic attack) 06/20/2017  . Primary osteoarthritis of both hips 06/20/2017  . Periodic limb movements of sleep 05/30/2017  . Shortness of breath 01/03/2017  . 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 OSA (obstructive sleep apnea) 07/01/2016  . Chronic prostatitis/chronic pelvic pain syndrome 05/28/2016  . Migraine without aura and without status migrainosus, not intractable 05/11/2016  . Chronic anticoagulation 03/29/2016  . Encounter for monitoring opioid maintenance therapy 11/04/2015  . Dysphagia, neurologic 09/16/2015  . Numbness 09/16/2015  . Anti-cardiolipin antibody positive 09/01/2015  . Prothrombin gene mutation ([place]) 09/01/2015  . History of cerebral venous sinus thrombosis 06/27/2015  . Anemia, iron deficiency 10/02/2014  . Chronic thoracic back pain 07/24/2014  . Bilateral occipital neuralgia 04/11/2014  . Hypothyroidism, unspecified 10/17/2013  . Type 2 diabetes, HbA1c goal < 7% (HCC) 10/17/2013  . Cervicogenic headache 07/04/2013  . Cervical spondylosis without myelopathy 11/16/2012  . Rotator cuff syndrome 11/13/2012  . Sinus congestion 11/07/2012  . Abdominal pain 09/28/2012  . Rectal bleeding 09/28/2012  . Depression 02/22/2012  . GERD (gastroesophageal reflux disease) 02/22/2012  . Essential hypertension 02/22/2012  . Obesity, unspecified 02/22/2012  . Neuromuscular disorder ([place]) 02/22/2012  . Pain in joint, pelvic region and thigh 01/17/2012  . Insomnia secondary to chronic pain 10/04/2011  . Right shoulder pain 10/04/2011  . Atypical facial pain  10/03/2011  . Chronic daily headache 10/03/2011  . Fibromyalgia 10/03/2011  . Hyperlipidemia, unspecified 10/03/2011    Gwenlyn Saran, SPT 12/27/2017, 10:30 AM   This entire session was performed under direct supervision and direction of a licensed therapist/therapist assistant . I have personally read, edited and approve of the note as written. Collie Siad PT, DPT  [facility] [address] [place], [place], [address] Phone: [phone]   Fax:  [phone]  Name: Christine Mcneil MRN: [id number] Date of Birth: 1966-04-30

## 2017-12-27 ENCOUNTER — Encounter: Payer: Self-pay | Admitting: Physical Therapy

## 2017-12-27 ENCOUNTER — Encounter: Payer: Medicare Other | Admitting: Physical Therapy

## 2018-01-03 ENCOUNTER — Encounter: Payer: Self-pay | Admitting: Family Medicine

## 2018-01-03 ENCOUNTER — Encounter: Payer: Self-pay | Admitting: Physical Therapy

## 2018-01-03 ENCOUNTER — Ambulatory Visit: Payer: Medicare Other | Admitting: Physical Therapy

## 2018-01-03 DIAGNOSIS — R296 Repeated falls: Secondary | ICD-10-CM

## 2018-01-03 DIAGNOSIS — M6281 Muscle weakness (generalized): Secondary | ICD-10-CM

## 2018-01-03 DIAGNOSIS — R269 Unspecified abnormalities of gait and mobility: Secondary | ICD-10-CM

## 2018-01-03 NOTE — Therapy (Signed)
[facility] [address]. [place], [place], [address] Phone: [phone]   Fax:  [phone]  Physical Therapy Treatment  Patient Details  Name: Christine Mcneil MRN: [id number] Date of Birth: 1965/12/11 Referring Provider: Dr. Jennings Books   Encounter Date: 01/03/2018  PT End of Session - 01/03/18 1440    Visit Number  12    Number of Visits  16    Date for PT Re-Evaluation  01/03/18    PT Start Time  1350    PT Stop Time  1422    PT Time Calculation (min)  32 min    Equipment Utilized During Treatment  Gait belt    Activity Tolerance  Patient tolerated treatment well    Behavior During Therapy  [facility] for tasks assessed/performed       Past Medical History:  Diagnosis Date  . Anxiety and depression   . Chronic pain   . Depression   . Diabetes mellitus without complication ([place])   . Diabetes mellitus, type II ([place])   . Hypertension   . Migraines   . Prothrombin gene mutation ([place])   . Thyroid disease   . TIA (transient ischemic attack) 05/27/2017    Past Surgical History:  Procedure Laterality Date  . ABDOMINAL HYSTERECTOMY    . CHOLECYSTECTOMY    . KNEE SURGERY Left   . ROTATOR CUFF REPAIR Right     There were no vitals filed for this visit.  Subjective Assessment - 01/03/18 1436    Subjective  Pt. reports to be fatigued upon entering into the clinic.  Pt. [place] been discussed.  Pt. reports hip pain and back pain is much worse than it's been in the past.    Pertinent History  Significant PMHx    Limitations  Standing;Walking;House hold activities    Patient Stated Goals  Increase LE strength/ balance/ independence with walking    Currently in Pain?  Yes    Pain Score  4     Pain Location  Hip    Pain Orientation  Right;Left    Pain Descriptors / Indicators  Tender;Sharp    Pain Type  Chronic pain    Pain Onset  More than a month ago    Multiple  Pain Sites  Yes    Pain Score  3    Pain Location  Back    Pain Orientation  Lower    Pain Descriptors / Indicators  Aching    Pain Type  Acute pain    Pain Onset  In the past 7 days    Pain Frequency  Intermittent          Treatment    Manual:  Supine B Distal Hamstring Stretch 4x30 sec each Supine B Proximal Hamstring Stretch 4x30 sec each Supine B Piriformis Stretch 4x30 sec eacg Supine B Figure-4 Stretch 4x30 sec each Supine B IR/ER Stretch 4x30 sec each Supine LAD to L LE 4x30 sec Supine Single-Knee-To-Chest 2x30 sec each  Pt. Is specifically tight with IR of B hips.       PT Long Term Goals - 12/06/17 1356      PT LONG TERM GOAL #1   Title  Pt. will be independent with HEP to increase B LE muscle strength 1/2 muscle grade to improve standing tolerance/ gait.      Baseline  R/L Hip Flex: 4/4+; R/L Knee Ext: 5/5;  Time  4    Period  Weeks    Status  Achieved    Target Date  11/22/17      PT LONG TERM GOAL #2   Title  Pt. will increase FOTO from 32 to 40 to improve functional mobility.     Baseline  FOTO: 40 12/06/17    Time  4    Period  Weeks    Status  Achieved    Target Date  11/10/17      PT LONG TERM GOAL #3   Title  Pt. will increase Berg balance score to >51/56 to decrease fall risk/ improve safety with gait.      Baseline  Berg 55/56 on 6/13    Time  4    Period  Weeks    Status  Achieved    Target Date  11/03/17      PT LONG TERM GOAL #4   Title  Pt. able to ambulate outside on grassy terrain with no LOB to promote independence with gait.      Baseline  increase fall risk on grass/ falls reported while gardening    Time  4    Period  Weeks    Status  Achieved    Target Date  11/22/17      PT LONG TERM GOAL #5   Title  Pt. will be able to perform SLS >30sec in B LE with no LOB to ensure ankle stability needed for ascending/descending stairs.    Baseline  Pt. able to hold SLS <5 sec without LOB.    Time  4    Period  Weeks    Status   New    Target Date  12/06/17      Additional Long Term Goals   Additional Long Term Goals  Yes      PT LONG TERM GOAL #6   Title  Pt. will improve FOTO to 48 to signify an increase in pain free functional mobility.    Baseline  FOTO: 40 on 12/06/17    Time  4    Period  Weeks    Status  New    Target Date  01/03/18      PT LONG TERM GOAL #7   Title  Pt. will report no falls in the next 4 weeks to demonstrate increased balance/ coordination with movement patterns.    Baseline  Reports a fall occurred 7/16    Time  4    Period  Weeks    Status  New    Target Date  01/03/18            Plan - 01/03/18 1652    Clinical Impression Statement  Pt. reported lots of fatigue and felt as though she would be unable to perform ther ex today.  Pt. was able to tolerate skilled manual therapy to increase ROM and decrease pain in B LE's, specifically with L hip.  Pt. continues to report decrease pain with LAD in L hip.  Pt. will continue with current POC at next visit including ther ex.    Clinical Presentation  Evolving    Clinical Decision Making  Moderate    Rehab Potential  Fair    PT Frequency  1x / week    PT Duration  4 weeks    PT Treatment/Interventions  ADLs/Self Care Home Management;Aquatic Therapy;Gait training;Moist Heat;Cryotherapy;Stair training;Functional mobility training;Therapeutic activities;Balance training;Therapeutic exercise;Patient/family education;Neuromuscular re-education;Manual techniques;Passive range of motion    PT Next Visit Plan  Assess compliance of HEP.    PT Home Exercise Plan  see HEP (revised with BlueTB)       Patient will benefit from skilled therapeutic intervention in order to improve the following deficits and impairments:  Abnormal gait, Improper body mechanics, Pain, Postural dysfunction, Decreased coordination, Decreased activity tolerance, Decreased endurance, Decreased range of motion, Decreased strength, Obesity, Impaired flexibility,  Difficulty walking, Decreased safety awareness, Decreased balance  Visit Diagnosis: Gait difficulty  Repeated falls  Muscle weakness (generalized)     Problem List Patient Active Problem List   Diagnosis Date Noted  . 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 TIA (transient ischemic attack) 06/20/2017  . Primary osteoarthritis of both hips 06/20/2017  . Periodic limb movements of sleep 05/30/2017  . Shortness of breath 01/03/2017  . 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 OSA (obstructive sleep apnea) 07/01/2016  . Chronic prostatitis/chronic pelvic pain syndrome 05/28/2016  . Migraine without aura and without status migrainosus, not intractable 05/11/2016  . Chronic anticoagulation 03/29/2016  . Encounter for monitoring opioid maintenance therapy 11/04/2015  . Dysphagia, neurologic 09/16/2015  . Numbness 09/16/2015  . Anti-cardiolipin antibody positive 09/01/2015  . Prothrombin gene mutation ([place]) 09/01/2015  . History of cerebral venous sinus thrombosis 06/27/2015  . Anemia, iron deficiency 10/02/2014  . Chronic thoracic back pain 07/24/2014  . Bilateral occipital neuralgia 04/11/2014  . Hypothyroidism, unspecified 10/17/2013  . Type 2 diabetes, HbA1c goal < 7% (HCC) 10/17/2013  . Cervicogenic headache 07/04/2013  . Cervical spondylosis without myelopathy 11/16/2012  . Rotator cuff syndrome 11/13/2012  . Sinus congestion 11/07/2012  . Abdominal pain 09/28/2012  . Rectal bleeding 09/28/2012  . Depression 02/22/2012  . GERD (gastroesophageal reflux disease) 02/22/2012  . Essential hypertension 02/22/2012  . Obesity, unspecified 02/22/2012  . Neuromuscular disorder ([place]) 02/22/2012  . Pain in joint, pelvic region and thigh 01/17/2012  . Insomnia secondary to chronic pain 10/04/2011  . Right shoulder pain 10/04/2011  . Atypical facial pain 10/03/2011  . Chronic daily headache 10/03/2011  . Fibromyalgia 10/03/2011  . Hyperlipidemia, unspecified 10/03/2011   Pura Spice, PT, DPT # [id number] Gwenlyn Saran, SPT 01/03/2018, 4:55 PM  [place] [facility] [address] [place], [place], [address] Phone: [phone]   Fax:  [phone]  Name: Christine Mcneil MRN: [id number] Date of Birth: [birth year]

## 2018-01-09 ENCOUNTER — Other Ambulatory Visit: Payer: Self-pay | Admitting: Psychiatry

## 2018-01-09 DIAGNOSIS — F331 Major depressive disorder, recurrent, moderate: Secondary | ICD-10-CM

## 2018-01-10 ENCOUNTER — Encounter: Payer: Self-pay | Admitting: Physical Therapy

## 2018-01-10 ENCOUNTER — Ambulatory Visit: Payer: Medicare Other | Admitting: Physical Therapy

## 2018-01-10 DIAGNOSIS — R269 Unspecified abnormalities of gait and mobility: Secondary | ICD-10-CM

## 2018-01-10 DIAGNOSIS — R296 Repeated falls: Secondary | ICD-10-CM

## 2018-01-10 DIAGNOSIS — M6281 Muscle weakness (generalized): Secondary | ICD-10-CM

## 2018-01-10 NOTE — Therapy (Signed)
[facility] [address] [place], [place], [address] Phone: [phone]   Fax:  [phone]  Physical Therapy Treatment  Patient Details  Name: Christine Mcneil MRN: [id number] Date of Birth: 16-Dec-1965 Referring Provider: Dr. Jennings Books   Encounter Date: 01/10/2018  PT End of Session - 01/10/18 1303    Visit Number  13    Number of Visits  17    Date for PT Re-Evaluation  02/07/18    PT Start Time  1257    PT Stop Time  1348    PT Time Calculation (min)  51 min    Equipment Utilized During Treatment  Gait belt    Activity Tolerance  Patient tolerated treatment well    Behavior During Therapy  [facility] for tasks assessed/performed       Past Medical History:  Diagnosis Date  . Anxiety and depression   . Chronic pain   . Depression   . Diabetes mellitus without complication ([place])   . Diabetes mellitus, type II ([place])   . Hypertension   . Migraines   . Prothrombin gene mutation ([place])   . Thyroid disease   . TIA (transient ischemic attack) 05/27/2017    Past Surgical History:  Procedure Laterality Date  . ABDOMINAL HYSTERECTOMY    . CHOLECYSTECTOMY    . KNEE SURGERY Left   . ROTATOR CUFF REPAIR Right     There were no vitals filed for this visit.  Subjective Assessment - 01/10/18 1255    Subjective  Pt. reports to be feeling much more energetic today compared to the last session, however B hip pain is worse today.  Pt. reports she will be attending the pain clinic on 8/27 at 9AM.    Pertinent History  Significant PMHx    Limitations  Standing;Walking;House hold activities    Patient Stated Goals  Increase LE strength/ balance/ independence with walking    Currently in Pain?  Yes    Pain Score  4     Pain Location  Hip    Pain Orientation  Right;Left    Pain Descriptors / Indicators  Tender;Sharp    Pain Type  Chronic pain    Pain Onset  More than a month ago    Pain Onset  In the past 7 days             TREATMENT  There Ex: Nustep for 61mn. B UE/LE(warm-up/not billed) Resisted walking in // bars with2BlackTB (Forward/ Back/ Lateral) x5 each Seated B Ankle 4-Way with BlueTB 2x15 each Standing Hip Exercises (marching/ abduction/ extension/ hamstring curls/ heel raises) 4# 2x15     Manual:  Supine B Distal Hamstring Stretch 4x30 sec  Supine B Proximal Hamstring Stretch 4x30 sec Supine B Piriformis Stretch 4x30 sec Supine B Figure-4 Stretch 2x30 sec Supine B IR/ER Stretch 2x30 sec  LAD for pain in R Hip (pt. Reports decreased pain)          PT Long Term Goals - 01/10/18 1354      PT LONG TERM GOAL #1   Title  Pt. will be independent with HEP to increase B LE muscle strength 1/2 muscle grade to improve standing tolerance/ gait.      Baseline  R/L Hip Flex: 4/4+; R/L Knee Ext: 5/5;     Time  4    Period  Weeks    Status  Achieved    Target Date  11/22/17      PT LONG  TERM GOAL #2   Title  Pt. will increase FOTO from 32 to 40 to improve functional mobility.     Baseline  FOTO: 40 12/06/17    Time  4    Period  Weeks    Status  Achieved      PT LONG TERM GOAL #3   Title  Pt. will increase Berg balance score to >51/56 to decrease fall risk/ improve safety with gait.      Baseline  Berg 55/56 on 6/13    Time  4    Period  Weeks    Status  Achieved      PT LONG TERM GOAL #4   Title  Pt. able to ambulate outside on grassy terrain with no LOB to promote independence with gait.      Baseline  increase fall risk on grass/ falls reported while gardening    Time  4    Period  Weeks    Status  Achieved      PT LONG TERM GOAL #5   Title  Pt. will be able to perform SLS >30sec in B LE with no LOB to ensure ankle stability needed for ascending/descending stairs.    Baseline  Pt. able to hold SLS <5 sec without LOB.    Time  4    Period  Weeks    Status  Achieved    Target Date  12/06/17      Additional Long Term Goals   Additional Long Term  Goals  Yes      PT LONG TERM GOAL #6   Title  Pt. will improve FOTO to 48 to signify an increase in pain free functional mobility.    Baseline  FOTO: 40 on 12/06/17; FOTO: 44 on 01/10/18    Time  4    Period  Weeks    Status  Partially Met    Target Date  01/03/18      PT LONG TERM GOAL #7   Title  Pt. will report no falls in the next 4 weeks to demonstrate increased balance/ coordination with movement patterns.    Baseline  Reports a fall occurred 7/16    Time  4    Period  Weeks    Status  Achieved    Target Date  02/07/18      PT LONG TERM GOAL #8   Title  Pt. will tolerate standing no increase in pain for >30 minutes in order to cook in kitchen.    Baseline  Unable to stand for 10 minutes without increase in pain of hip.    Time  4    Period  Weeks    Status  New    Target Date  02/07/18         Plan - 01/10/18 1304    Clinical Impression Statement  Pt. has consistently made progress towards goals set by PT.  Pt. is moving much better, however still demonstrates an antalgic gait when ambulating.  Pt. responds well to manual therapy and reports a decrease in pain with LAD.  Pt. reassessed with scour's and is still possible in all three test positions bilaterally.  Pt. has made significant improvements with balance and strength in LE's.  Pt. will continue to benefit from skilled therapy in order to increase balance for fall risks reduction and improvement in QoL.    Clinical Presentation  Evolving    Clinical Decision Making  Moderate    Rehab Potential  Fair  PT Frequency  1x / week    PT Duration  4 weeks    PT Treatment/Interventions  ADLs/Self Care Home Management;Aquatic Therapy;Gait training;Moist Heat;Cryotherapy;Stair training;Functional mobility training;Therapeutic activities;Balance training;Therapeutic exercise;Patient/family education;Neuromuscular re-education;Manual techniques;Passive range of motion    PT Next Visit Plan  Assess HEP.    PT Home Exercise Plan   see HEP (revised with BlueTB)       Patient will benefit from skilled therapeutic intervention in order to improve the following deficits and impairments:  Abnormal gait, Improper body mechanics, Pain, Postural dysfunction, Decreased coordination, Decreased activity tolerance, Decreased endurance, Decreased range of motion, Decreased strength, Obesity, Impaired flexibility, Difficulty walking, Decreased safety awareness, Decreased balance  Visit Diagnosis: Gait difficulty  Repeated falls  Muscle weakness (generalized)     Problem List Patient Active Problem List   Diagnosis Date Noted  . 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 TIA (transient ischemic attack) 06/20/2017  . Primary osteoarthritis of both hips 06/20/2017  . Periodic limb movements of sleep 05/30/2017  . Shortness of breath 01/03/2017  . 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 OSA (obstructive sleep apnea) 07/01/2016  . Chronic prostatitis/chronic pelvic pain syndrome 05/28/2016  . Migraine without aura and without status migrainosus, not intractable 05/11/2016  . Chronic anticoagulation 03/29/2016  . Encounter for monitoring opioid maintenance therapy 11/04/2015  . Dysphagia, neurologic 09/16/2015  . Numbness 09/16/2015  . Anti-cardiolipin antibody positive 09/01/2015  . Prothrombin gene mutation ([place]) 09/01/2015  . History of cerebral venous sinus thrombosis 06/27/2015  . Anemia, iron deficiency 10/02/2014  . Chronic thoracic back pain 07/24/2014  . Bilateral occipital neuralgia 04/11/2014  . Hypothyroidism, unspecified 10/17/2013  . Type 2 diabetes, HbA1c goal < 7% (HCC) 10/17/2013  . Cervicogenic headache 07/04/2013  . Cervical spondylosis without myelopathy 11/16/2012  . Rotator cuff syndrome 11/13/2012  . Sinus congestion 11/07/2012  . Abdominal pain 09/28/2012  . Rectal bleeding 09/28/2012  . Depression 02/22/2012  . GERD (gastroesophageal reflux disease) 02/22/2012  . Essential hypertension  02/22/2012  . Obesity, unspecified 02/22/2012  . Neuromuscular disorder ([place]) 02/22/2012  . Pain in joint, pelvic region and thigh 01/17/2012  . Insomnia secondary to chronic pain 10/04/2011  . Right shoulder pain 10/04/2011  . Atypical facial pain 10/03/2011  . Chronic daily headache 10/03/2011  . Fibromyalgia 10/03/2011  . Hyperlipidemia, unspecified 10/03/2011   Pura Spice, PT, DPT # [id number] Gwenlyn Saran, SPT 01/11/2018, 3:48 PM  [place] [facility] [address] [place], [place], [address] Phone: [phone]   Fax:  [phone]  Name: Christine Mcneil MRN: [id number] Date of Birth: [birth year]

## 2018-01-11 ENCOUNTER — Ambulatory Visit (INDEPENDENT_AMBULATORY_CARE_PROVIDER_SITE_OTHER): Payer: Medicare Other | Admitting: Licensed Clinical Social Worker

## 2018-01-11 DIAGNOSIS — F331 Major depressive disorder, recurrent, moderate: Secondary | ICD-10-CM

## 2018-01-12 ENCOUNTER — Encounter: Payer: Self-pay | Admitting: Oncology

## 2018-01-12 ENCOUNTER — Inpatient Hospital Stay: Payer: Medicare Other

## 2018-01-12 ENCOUNTER — Inpatient Hospital Stay: Payer: Medicare Other | Attending: Oncology | Admitting: Oncology

## 2018-01-12 ENCOUNTER — Other Ambulatory Visit: Payer: Self-pay

## 2018-01-12 VITALS — BP 144/97 | HR 105 | Temp 96.8°F | Resp 18 | Wt 228.0 lb

## 2018-01-12 DIAGNOSIS — R778 Other specified abnormalities of plasma proteins: Secondary | ICD-10-CM | POA: Diagnosis not present

## 2018-01-12 DIAGNOSIS — D509 Iron deficiency anemia, unspecified: Secondary | ICD-10-CM

## 2018-01-12 LAB — IRON AND TIBC
Iron: 318 ug/dL — ABNORMAL HIGH (ref 28–170)
SATURATION RATIOS: 55 % — AB (ref 10.4–31.8)
TIBC: 576 ug/dL — ABNORMAL HIGH (ref 250–450)
UIBC: 258 ug/dL

## 2018-01-12 LAB — COMPREHENSIVE METABOLIC PANEL
ALT: 29 U/L (ref 0–44)
AST: 25 U/L (ref 15–41)
Albumin: 4.5 g/dL (ref 3.5–5.0)
Alkaline Phosphatase: 118 U/L (ref 38–126)
Anion gap: 10 (ref 5–15)
BUN: 9 mg/dL (ref 6–20)
CHLORIDE: 102 mmol/L (ref 98–111)
CO2: 22 mmol/L (ref 22–32)
CREATININE: 0.76 mg/dL (ref 0.44–1.00)
Calcium: 9.2 mg/dL (ref 8.9–10.3)
Glucose, Bld: 144 mg/dL — ABNORMAL HIGH (ref 70–99)
POTASSIUM: 3.6 mmol/L (ref 3.5–5.1)
SODIUM: 134 mmol/L — AB (ref 135–145)
Total Bilirubin: 0.5 mg/dL (ref 0.3–1.2)
Total Protein: 8.5 g/dL — ABNORMAL HIGH (ref 6.5–8.1)

## 2018-01-12 LAB — CBC WITH DIFFERENTIAL/PLATELET
Basophils Absolute: 0.1 10*3/uL (ref 0–0.1)
Basophils Relative: 1 %
EOS ABS: 0.2 10*3/uL (ref 0–0.7)
Eosinophils Relative: 2 %
HCT: 38.2 % (ref 35.0–47.0)
HEMOGLOBIN: 11.8 g/dL — AB (ref 12.0–16.0)
LYMPHS ABS: 2.1 10*3/uL (ref 1.0–3.6)
LYMPHS PCT: 27 %
MCH: 22.6 pg — AB (ref 26.0–34.0)
MCHC: 30.9 g/dL — AB (ref 32.0–36.0)
MCV: 73.1 fL — ABNORMAL LOW (ref 80.0–100.0)
MONOS PCT: 6 %
Monocytes Absolute: 0.5 10*3/uL (ref 0.2–0.9)
NEUTROS PCT: 64 %
Neutro Abs: 5.2 10*3/uL (ref 1.4–6.5)
Platelets: 320 10*3/uL (ref 150–440)
RBC: 5.22 MIL/uL — AB (ref 3.80–5.20)
RDW: 24 % — ABNORMAL HIGH (ref 11.5–14.5)
WBC: 8.1 10*3/uL (ref 3.6–11.0)

## 2018-01-12 LAB — TECHNOLOGIST SMEAR REVIEW

## 2018-01-12 LAB — RETICULOCYTES
RBC.: 5.2 MIL/uL (ref 3.80–5.20)
RETIC COUNT ABSOLUTE: 72.8 10*3/uL (ref 19.0–183.0)
RETIC CT PCT: 1.4 % (ref 0.4–3.1)

## 2018-01-12 LAB — FERRITIN: Ferritin: 10 ng/mL — ABNORMAL LOW (ref 11–307)

## 2018-01-12 LAB — FOLATE: Folate: 18.5 ng/mL (ref >5.9)

## 2018-01-12 LAB — VITAMIN B12: Vitamin B-12: 1927 pg/mL — ABNORMAL HIGH (ref 180–914)

## 2018-01-12 NOTE — Progress Notes (Signed)
Patient here for initial visit. °

## 2018-01-12 NOTE — Progress Notes (Signed)
Hematology/Oncology Consult note [facility] [phone] [phone]   Patient Care Team: Weyman Rodney, MD as PCP - General (Family Medicine)  REFERRING PROVIDER: Weyman Rodney, MD CHIEF COMPLAINTS/REASON FOR VISIT:  Evaluation of IDA  HISTORY OF PRESENTING ILLNESS:  Christine Mcneil is a  52 y.o.  female with PMH listed below who was referred to me for evaluation of iron deficiency anemia.  History of iron deficiency: chronic iron deficiency, take oral iron supplementation.   Rectal bleeding: denies, however per patient recent FOBT was positive. She has been referred to see GI Menstrual bleeding/ Vaginal bleeding : denies Hematemesis or hemoptysis : denies Blood in urine : denies  Last endoscopy: NA Fatigue: reports worsening fatigue. Chronic onset, perisistent, no aggravating or improving factors, no associated symptoms.   Pertinent Hemonc histroy Per her medical records, she has history of prothrombin gene mutation, previous cerebral vein thrombosis and TIA, on long term anticoagulation with Pradaxa.   Review of Systems  Constitutional: Positive for malaise/fatigue. Negative for chills, fever and weight loss.  HENT: Negative for nosebleeds and sore throat.   Eyes: Negative for double vision, photophobia and redness.  Respiratory: Positive for shortness of breath. Negative for cough and wheezing.   Cardiovascular: Negative for chest pain, palpitations and orthopnea.  Gastrointestinal: Positive for blood in stool. Negative for abdominal pain, nausea and vomiting.  Genitourinary: Negative for dysuria.  Musculoskeletal:       Chronic pain  Skin: Negative for itching and rash.  Neurological: Negative for dizziness, tingling and tremors.  Endo/Heme/Allergies: Negative for environmental allergies. Does not bruise/bleed easily.  Psychiatric/Behavioral: Negative for depression.    MEDICAL HISTORY:  Past Medical History:  Diagnosis Date   . Abdominal pain   . Anxiety and depression   . Atypical facial pain   . Bilateral occipital neuralgia   . Cervical spondylosis without myelopathy   . Chronic daily headache   . Chronic pain   . Chronic prostatitis   . Chronic thoracic back pain   . Depression   . Diabetes mellitus without complication ([place])   . Diabetes mellitus, type II ([place])   . Dysphagia   . Fibromyalgia   . Hyperlipidemia   . Hypertension   . Insomnia   . Iron deficiency anemia   . Left hip pain   . Low back pain   . Migraines   . Numbness   . Optic neuropathy   . OSA (obstructive sleep apnea)   . Polyarthralgia   . Primary osteoarthritis of both hips   . Prothrombin gene mutation ([place])   . Pseudotumor cerebri   . Rectal bleeding   . Right shoulder pain   . Rotator cuff syndrome   . Thyroid disease   . TIA (transient ischemic attack) 05/27/2017    SURGICAL HISTORY: Past Surgical History:  Procedure Laterality Date  . ABDOMINAL HYSTERECTOMY     total  . CHOLECYSTECTOMY    . KNEE SURGERY Left   . ROTATOR CUFF REPAIR Right     SOCIAL HISTORY: Social History   Socioeconomic History  . Marital status: Divorced    Spouse name: Not on file  . Number of children: 1  . Years of education: Not on file  . Highest education level: High school graduate  Occupational History    Comment: disablitiy  Social Needs  . Financial resource strain: Not hard at all  . Food insecurity:    Worry: Never true    Inability: Never true  .  Transportation needs:    Medical: No    Non-medical: No  Tobacco Use  . Smoking status: Never Smoker  . Smokeless tobacco: Never Used  Substance and Sexual Activity  . Alcohol use: No  . Drug use: No  . Sexual activity: Not Currently  Lifestyle  . Physical activity:    Days per week: 0 days    Minutes per session: 0 min  . Stress: Very much  Relationships  . Social connections:    Talks on phone: Once a week    Gets together: Never    Attends religious  service: More than 4 times per year    Active member of club or organization: No    Attends meetings of clubs or organizations: Never    Relationship status: Divorced  . Intimate partner violence:    Fear of current or ex partner: No    Emotionally abused: No    Physically abused: No    Forced sexual activity: No  Other Topics Concern  . Not on file  Social History Narrative  . Not on file    FAMILY HISTORY: Family History  Problem Relation Age of Onset  . Diabetes Mother   . Hyperlipidemia Mother   . Hypertension Mother   . COPD Mother   . CVA Mother   . Anemia Mother   . Diabetes Father   . Hyperlipidemia Father   . Hypertension Father   . Thyroid disease Father   . Alcohol abuse Father   . Peripheral vascular disease Sister   . Hypertension Sister   . Anxiety disorder Son   . Depression Son   . Bipolar disorder Son   . Seizures Son     ALLERGIES:  is allergic to amoxicillin; amoxapine; and keflex [cephalexin].  MEDICATIONS:  Current Outpatient Medications  Medication Sig Dispense Refill  . ACCU-CHEK SOFTCLIX LANCETS lancets     . acetaZOLAMIDE (DIAMOX) 125 MG tablet     . AIMOVIG 70 MG/ML SOAJ Inject 70 mg into the skin every 28 (twenty-eight) days.    . ARIPiprazole (ABILIFY) 2 MG tablet TAKE (1) TABLET BY MOUTH EVERY DAY 90 tablet 0  . aspirin-acetaminophen-caffeine (EXCEDRIN MIGRAINE) 250-250-65 MG tablet Take 2 tablets by mouth every 8 (eight) hours as needed for headache or migraine.     Marland Kitchen atorvastatin (LIPITOR) 80 MG tablet Take 80 mg by mouth daily at 6 PM.    . Blood Glucose Monitoring Suppl (ACCU-CHEK AVIVA PLUS) w/Device KIT     . buPROPion (WELLBUTRIN XL) 150 MG 24 hr tablet Take 1 tablet (150 mg total) by mouth daily. 90 tablet 0  . Carboxymethylcellulose Sod PF (REFRESH PLUS) 0.5 % SOLN Apply to eye.    . Cholecalciferol (VITAMIN D3) 1000 units CAPS Take 1 capsule by mouth daily.    . clonazePAM (KLONOPIN) 0.5 MG tablet May use up to once daily as  needed for facial pain    . clotrimazole (LOTRIMIN) 1 % cream     . cyclobenzaprine (FLEXERIL) 5 MG tablet     . dabigatran (PRADAXA) 150 MG CAPS capsule Take by mouth.    . diclofenac (FLECTOR) 1.3 % PTCH     . diltiazem (CARDIZEM CD) 240 MG 24 hr capsule Take by mouth. Take 1 capsule by mouth once daily    . DULoxetine (CYMBALTA) 30 MG capsule Take 3 capsules (90 mg total) by mouth daily. 270 capsule 0  . DULoxetine (CYMBALTA) 60 MG capsule     . esomeprazole (NEXIUM) 40  MG capsule Take 40 mg by mouth daily.    . ferrous sulfate 325 (65 FE) MG tablet Take by mouth. Take 325 mg daily with breakfast    . fluticasone (FLONASE) 50 MCG/ACT nasal spray     . hydrOXYzine (ATARAX/VISTARIL) 50 MG tablet Take 50 mg by mouth 3 (three) times daily as needed.     Marland Kitchen ketoconazole (NIZORAL) 2 % cream 1 application.    Marland Kitchen lamoTRIgine (LAMICTAL) 100 MG tablet TAKE (1) TABLET BY MOUTH EVERY DAY 90 tablet 0  . levothyroxine (SYNTHROID, LEVOTHROID) 100 MCG tablet Take 100 mcg by mouth daily.    Marland Kitchen loratadine (CLARITIN) 10 MG tablet Take 10 mg by mouth daily as needed for allergies.    Marland Kitchen MAGNESIUM PO Take 500 mg by mouth 2 (two) times daily.    . metFORMIN (GLUCOPHAGE) 500 MG tablet Take 500 mg by mouth 2 (two) times daily.    Gean [place] ER 100 MG 12 hr tablet Take 1 tablet by mouth 2 (two) times daily.    . ondansetron (ZOFRAN-ODT) 4 MG disintegrating tablet Take 4 mg by mouth every 8 (eight) hours as needed for nausea or vomiting.    . pioglitazone (ACTOS) 15 MG tablet Take by mouth. Take 1 tablet by mouth once daily    . promethazine (PHENERGAN) 25 MG tablet Take 25 mg by mouth every 8 (eight) hours as needed for nausea or vomiting.    . quinapril (ACCUPRIL) 10 MG tablet Take 10 mg by mouth daily.    . ranitidine (ZANTAC) 150 MG tablet Take 150 mg by mouth 2 (two) times daily.    . rizatriptan (MAXALT) 10 MG tablet Take 1 tab at migraine onset. May repeat in 2 hours prn.  Limit to 2 in 24 hours and to 2 days  weekly.    . sitaGLIPtin (JANUVIA) 100 MG tablet Take 100 mg by mouth daily.    . traZODone (DESYREL) 50 MG tablet Take 50 mg by mouth daily as needed.    . dabigatran (PRADAXA) 150 MG CAPS capsule Take 150 mg by mouth 2 (two) times daily.    Marland Kitchen glipiZIDE (GLUCOTROL) 5 MG tablet Take 1 tablet (5 mg total) by mouth 2 (two) times daily before a meal. (Patient not taking: Reported on 01/12/2018) 60 tablet 0  . metoCLOPramide (REGLAN) 10 MG tablet Take 1 tablet (10 mg total) by mouth every 8 (eight) hours as needed for up to 3 days for nausea. 20 tablet 0  . propranolol (INDERAL) 10 MG tablet Take 1 tablet by mouth 3 (three) times daily.    . verapamil (CALAN-SR) 240 MG CR tablet Take 1 tablet by mouth daily.    Marland Kitchen zolpidem (AMBIEN) 10 MG tablet Take 10 mg by mouth daily.     No current facility-administered medications for this visit.      PHYSICAL EXAMINATION: ECOG PERFORMANCE STATUS: 1 - Symptomatic but completely ambulatory Vitals:   01/12/18 0958  BP: (!) 144/97  Pulse: (!) 105  Resp: 18  Temp: (!) 96.8 F (36 C)   Filed Weights   01/12/18 0958  Weight: 227 lb 15.3 oz (103.4 kg)    Physical Exam  Constitutional: She is oriented to person, place, and time. No distress.  Morbidly obese  HENT:  Head: Normocephalic and atraumatic.  Right Ear: External ear normal.  Left Ear: External ear normal.  Mouth/Throat: Oropharynx is clear and moist.  Eyes: Pupils are equal, round, and reactive to light. EOM are normal. No scleral  icterus.  Neck: Normal range of motion. Neck supple.  Cardiovascular: Normal rate, regular rhythm and normal heart sounds.  Pulmonary/Chest: Effort normal. No respiratory distress. She has no wheezes.  Abdominal: Soft. Bowel sounds are normal. She exhibits no distension and no mass. There is no tenderness.  Musculoskeletal: Normal range of motion. She exhibits no edema or deformity.  Neurological: She is alert and oriented to person, place, and time. No cranial  nerve deficit. Coordination normal.  Skin: Skin is warm and dry. No erythema.  Psychiatric: She has a normal mood and affect.     LABORATORY DATA:  I have reviewed the data as listed Lab Results  Component Value Date   WBC 8.1 01/12/2018   HGB 11.8 (L) 01/12/2018   HCT 38.2 01/12/2018   MCV 73.1 (L) 01/12/2018   PLT 320 01/12/2018   Recent Labs    06/08/17 1244 06/20/17 1514 01/12/18 1022  NA 139 137 134*  K 3.9 3.9 3.6  CL 104 104 102  CO2 22 23 22   GLUCOSE 258* 175* 144*  BUN 12 12 9   CREATININE 0.85 0.82 0.76  CALCIUM 9.7 9.2 9.2  GFRNONAA >60 >60 >60  GFRAA >60 >60 >60  PROT 7.9 8.0 8.5*  ALBUMIN 4.3 4.4 4.5  AST 28 22 25   ALT 27 30 29   ALKPHOS 126 108 118  BILITOT 0.3 0.6 0.5   Iron/TIBC/Ferritin/ %Sat    Component Value Date/Time   IRON 61 01/18/2018 1507   TIBC 524 (H) 01/18/2018 1507   FERRITIN 7 (L) 01/18/2018 1507   IRONPCTSAT 12 01/18/2018 1507        ASSESSMENT & PLAN:  1. Iron deficiency anemia, unspecified iron deficiency anemia type   2. Elevated total protein    Anemia: multifactorial with possible causes including chronic blood loss, hyper/hypothyroidism, nutritional deficiency, infection/chronic inflammation, hemolysis, underlying bone marrow disorders. Will check CBC w differential, CMP, vitamin B12, Folate, iron/TIBC, ferritin, reticulocytes,  blood smear,  LDH  Labs reviewed. Hemoglobin 11.8, mcv 73.1, ferritin 10, TIBC 576, iron saturation elevated at 55.  Normal folate, high B12 level.   High iron saturation does not make much sense clinically she is microcytic anemia consistent with iron deficiency.  I repeated iron panel. Labs reviewed and showed ferritin 7, saturation 12, TIBC 524, iron 61.  Plan IV iron with Venofer 253m weekly x 4 doses. Allergy reactions/infusion reaction including anaphylactic reaction discussed with patient. Other side effects include but not limited to high blood pressure, skin rash, weight gain, leg  swelling, etc. Patient voices understanding and willing to proceed.  # Elevated total protein, check SPEP. Results pending.    Orders Placed This Encounter  Procedures  . CBC with Differential/Platelet    Standing Status:   Future    Number of Occurrences:   1    Standing Expiration Date:   01/13/2019  . Comprehensive metabolic panel    Standing Status:   Future    Number of Occurrences:   1    Standing Expiration Date:   01/13/2019  . Iron and TIBC    Standing Status:   Future    Number of Occurrences:   1    Standing Expiration Date:   01/13/2019  . Ferritin    Standing Status:   Future    Number of Occurrences:   1    Standing Expiration Date:   01/13/2019  . Folate    Standing Status:   Future    Number of Occurrences:  1    Standing Expiration Date:   01/13/2019  . Vitamin B12    Standing Status:   Future    Number of Occurrences:   1    Standing Expiration Date:   01/13/2019  . Anti-parietal antibody    Standing Status:   Future    Number of Occurrences:   1    Standing Expiration Date:   01/13/2019  . Intrinsic Factor Antibodies    Standing Status:   Future    Number of Occurrences:   1    Standing Expiration Date:   01/13/2019  . Reticulocytes    Standing Status:   Future    Number of Occurrences:   1    Standing Expiration Date:   01/13/2019  . Technologist smear review    Standing Status:   Future    Number of Occurrences:   1    Standing Expiration Date:   01/13/2019  . CBC with Differential/Platelet    Standing Status:   Future    Standing Expiration Date:   01/13/2019  . Ferritin    Standing Status:   Future    Standing Expiration Date:   01/13/2019  . Iron and TIBC    Standing Status:   Future    Standing Expiration Date:   01/13/2019    All questions were answered. The patient knows to call the clinic with any problems questions or concerns.  Return of visit:  Thank you for this kind referral and the opportunity to participate in the care of this  patient. A copy of today's note is routed to referring provider  Total face to face encounter time for this patient visit was 55mn. >50% of the time was  spent in counseling and coordination of care.    ZEarlie Server MD, PhD Hematology Oncology COnyx And Pearl Surgical Suit[facility] LLCat ALand[facility], LLCPager- [phone]/22/2019

## 2018-01-13 ENCOUNTER — Encounter: Payer: Self-pay | Admitting: Psychiatry

## 2018-01-13 ENCOUNTER — Ambulatory Visit (INDEPENDENT_AMBULATORY_CARE_PROVIDER_SITE_OTHER): Payer: Medicare Other | Admitting: Psychiatry

## 2018-01-13 ENCOUNTER — Other Ambulatory Visit: Payer: Self-pay

## 2018-01-13 VITALS — BP 142/88 | HR 97 | Wt 227.8 lb

## 2018-01-13 DIAGNOSIS — F331 Major depressive disorder, recurrent, moderate: Secondary | ICD-10-CM | POA: Diagnosis not present

## 2018-01-13 DIAGNOSIS — F5105 Insomnia due to other mental disorder: Secondary | ICD-10-CM | POA: Diagnosis not present

## 2018-01-13 DIAGNOSIS — F4310 Post-traumatic stress disorder, unspecified: Secondary | ICD-10-CM | POA: Diagnosis not present

## 2018-01-13 LAB — ANTI-PARIETAL ANTIBODY: Parietal Cell Antibody-IgG: 4.4 Units (ref 0.0–20.0)

## 2018-01-13 LAB — INTRINSIC FACTOR ANTIBODIES: Intrinsic Factor: 1 AU/mL (ref 0.0–1.1)

## 2018-01-13 MED ORDER — LAMOTRIGINE 25 MG PO TABS: 25.0000 mg | ORAL_TABLET | Freq: Every day | ORAL | 0 refills | Status: DC

## 2018-01-13 MED ORDER — LAMOTRIGINE 100 MG PO TABS: ORAL_TABLET | ORAL | 0 refills | Status: DC

## 2018-01-13 MED ORDER — DULOXETINE HCL 30 MG PO CPEP: 90.0000 mg | ORAL_CAPSULE | Freq: Every day | ORAL | 0 refills | Status: DC

## 2018-01-13 MED ORDER — ARIPIPRAZOLE 5 MG PO TABS: 5.0000 mg | ORAL_TABLET | Freq: Every day | ORAL | 0 refills | Status: DC

## 2018-01-13 MED ORDER — BUPROPION HCL ER (XL) 150 MG PO TB24: 150.0000 mg | ORAL_TABLET | Freq: Every day | ORAL | 0 refills | Status: DC

## 2018-01-13 MED ORDER — HYDROXYZINE HCL 50 MG PO TABS: 50.0000 mg | ORAL_TABLET | Freq: Three times a day (TID) | ORAL | 0 refills | Status: DC | PRN

## 2018-01-13 NOTE — Progress Notes (Signed)
[facility] MD OP Progress Note  01/13/2018 12:29 PM Christine Mcneil  MRN:  [id number]  Chief Complaint: ' I am here for follow up.' Chief Complaint    Follow-up; Medication Refill     HPI: Christine Mcneil is a 52 yr old Caucasian female, divorced, lives in [place], [place], has a history of depression, anger issues, PTSD, sleep problems, OSA noncompliant with CPAP, history of CVA, history of prothrombin gene mutation, chronic pain, migraine, presented to the clinic today for a follow-up visit.  Patient today reports she was recently diagnosed with some health problems.  She reports she has iron deficiency anemia.  Patient reports she also has GI bleed which is currently being investigated.  Patient reports she has upcoming appointments with her provider next week and she looks forward to that.  Patient reports this has been making her more anxious.  It also affects her mood to the point that she gets irritable often.  She and her boyfriend has been hence having some verbal altercations on and off since she has been more sensitive.    Discussed medication changes with patient.  Discussed increasing the dosage of her mood stabilizer.  Patient reports she is compliant with her medications and denies any side effects.  Patient denies any suicidality.  Patient denies any homicidality.  Patient denies any perceptual disturbances. Visit Diagnosis:    ICD-10-CM   1. MDD (major depressive disorder), recurrent episode, moderate (HCC) F33.1 buPROPion (WELLBUTRIN XL) 150 MG 24 hr tablet    ARIPiprazole (ABILIFY) 5 MG tablet    DULoxetine (CYMBALTA) 30 MG capsule    lamoTRIgine (LAMICTAL) 100 MG tablet    lamoTRIgine (LAMICTAL) 25 MG tablet  2. PTSD (post-traumatic stress disorder) F43.10 ARIPiprazole (ABILIFY) 5 MG tablet    DULoxetine (CYMBALTA) 30 MG capsule    lamoTRIgine (LAMICTAL) 25 MG tablet  3. Insomnia due to mental disorder F51.05     Past Psychiatric History: Reviewed past psychiatric history from my progress  note on 08/16/2017.  Past trials of Effexor, Klonopin.  Past Medical History:  Past Medical History:  Diagnosis Date  . Abdominal pain   . Anxiety and depression   . Atypical facial pain   . Bilateral occipital neuralgia   . Cervical spondylosis without myelopathy   . Chronic daily headache   . Chronic pain   . Chronic prostatitis   . Chronic thoracic back pain   . Depression   . Diabetes mellitus without complication ([place])   . Diabetes mellitus, type II ([place])   . Dysphagia   . Fibromyalgia   . Hyperlipidemia   . Hypertension   . Insomnia   . Iron deficiency anemia   . Left hip pain   . Low back pain   . Migraines   . Numbness   . Optic neuropathy   . OSA (obstructive sleep apnea)   . Polyarthralgia   . Primary osteoarthritis of both hips   . Prothrombin gene mutation ([place])   . Pseudotumor cerebri   . Rectal bleeding   . Right shoulder pain   . Rotator cuff syndrome   . Thyroid disease   . TIA (transient ischemic attack) 05/27/2017    Past Surgical History:  Procedure Laterality Date  . ABDOMINAL HYSTERECTOMY     total  . CHOLECYSTECTOMY    . KNEE SURGERY Left   . ROTATOR CUFF REPAIR Right     Family Psychiatric History: I have reviewed family psychiatric history from my progress note on 08/16/2017  Family  History:  Family History  Problem Relation Age of Onset  . Diabetes Mother   . Hyperlipidemia Mother   . Hypertension Mother   . COPD Mother   . CVA Mother   . Anemia Mother   . Diabetes Father   . Hyperlipidemia Father   . Hypertension Father   . Thyroid disease Father   . Alcohol abuse Father   . Peripheral vascular disease Sister   . Hypertension Sister   . Anxiety disorder Son   . Depression Son   . Bipolar disorder Son   . Seizures Son     Social History: Reviewed social history from my progress note on 08/16/2017 Social History   Socioeconomic History  . Marital status: Divorced    Spouse name: Not on file  . Number of children: 1  .  Years of education: Not on file  . Highest education level: High school graduate  Occupational History    Comment: disablitiy  Social Needs  . Financial resource strain: Not hard at all  . Food insecurity:    Worry: Never true    Inability: Never true  . Transportation needs:    Medical: No    Non-medical: No  Tobacco Use  . Smoking status: Never Smoker  . Smokeless tobacco: Never Used  Substance and Sexual Activity  . Alcohol use: No  . Drug use: No  . Sexual activity: Not Currently  Lifestyle  . Physical activity:    Days per week: 0 days    Minutes per session: 0 min  . Stress: Very much  Relationships  . Social connections:    Talks on phone: Once a week    Gets together: Never    Attends religious service: More than 4 times per year    Active member of club or organization: No    Attends meetings of clubs or organizations: Never    Relationship status: Divorced  Other Topics Concern  . Not on file  Social History Narrative  . Not on file    Allergies:  Allergies  Allergen Reactions  . Amoxicillin Itching  . Amoxapine Itching  . Keflex [Cephalexin] Itching    Metabolic Disorder Labs: Lab Results  Component Value Date   HGBA1C 9.9 (H) 06/21/2017   MPG 237.43 06/21/2017   Lab Results  Component Value Date   PROLACTIN 5.5 09/06/2017   Lab Results  Component Value Date   CHOL 277 (H) 06/21/2017   TRIG 196 (H) 06/21/2017   HDL 64 06/21/2017   CHOLHDL 4.3 06/21/2017   VLDL 39 06/21/2017   LDLCALC 174 (H) 06/21/2017   Lab Results  Component Value Date   TSH 2.890 09/06/2017   TSH 3.176 01/04/2017    Therapeutic Level Labs: No results found for: LITHIUM No results found for: VALPROATE No components found for:  CBMZ  Current Medications: Current Outpatient Medications  Medication Sig Dispense Refill  . ACCU-CHEK SOFTCLIX LANCETS lancets     . acetaZOLAMIDE (DIAMOX) 125 MG tablet     . AIMOVIG 70 MG/ML SOAJ Inject 70 mg into the skin every 28  (twenty-eight) days.    Marland Kitchen aspirin-acetaminophen-caffeine (EXCEDRIN MIGRAINE) 250-250-65 MG tablet Take 2 tablets by mouth every 8 (eight) hours as needed for headache or migraine.     Marland Kitchen atorvastatin (LIPITOR) 80 MG tablet Take 80 mg by mouth daily at 6 PM.    . Blood Glucose Monitoring Suppl (ACCU-CHEK AVIVA PLUS) w/Device KIT     . buPROPion (WELLBUTRIN XL) 150 MG  24 hr tablet Take 1 tablet (150 mg total) by mouth daily. 90 tablet 0  . Carboxymethylcellulose Sod PF (REFRESH PLUS) 0.5 % SOLN Apply to eye.    . Cholecalciferol (VITAMIN D3) 1000 units CAPS Take 1 capsule by mouth daily.    . clonazePAM (KLONOPIN) 0.5 MG tablet May use up to once daily as needed for facial pain    . clotrimazole (LOTRIMIN) 1 % cream     . cyclobenzaprine (FLEXERIL) 5 MG tablet     . dabigatran (PRADAXA) 150 MG CAPS capsule Take by mouth.    . diclofenac (FLECTOR) 1.3 % PTCH     . diltiazem (CARDIZEM CD) 240 MG 24 hr capsule Take by mouth. Take 1 capsule by mouth once daily    . DULoxetine (CYMBALTA) 30 MG capsule Take 3 capsules (90 mg total) by mouth daily. 270 capsule 0  . esomeprazole (NEXIUM) 40 MG capsule Take 40 mg by mouth daily.    . ferrous sulfate 325 (65 FE) MG tablet Take by mouth. Take 325 mg daily with breakfast    . fluticasone (FLONASE) 50 MCG/ACT nasal spray     . glipiZIDE (GLUCOTROL) 5 MG tablet Take 1 tablet (5 mg total) by mouth 2 (two) times daily before a meal. 60 tablet 0  . hydrOXYzine (ATARAX/VISTARIL) 50 MG tablet Take 1 tablet (50 mg total) by mouth 3 (three) times daily as needed for anxiety. 270 tablet 0  . ketoconazole (NIZORAL) 2 % cream 1 application.    Marland Kitchen lamoTRIgine (LAMICTAL) 100 MG tablet To be combined with 25 mg 90 tablet 0  . levothyroxine (SYNTHROID, LEVOTHROID) 100 MCG tablet Take 100 mcg by mouth daily.    Marland Kitchen loratadine (CLARITIN) 10 MG tablet Take 10 mg by mouth daily as needed for allergies.    Marland Kitchen MAGNESIUM PO Take 500 mg by mouth 2 (two) times daily.    . metFORMIN  (GLUCOPHAGE) 500 MG tablet Take 500 mg by mouth 2 (two) times daily.    Gean [place]ch[place] ER 100 MG 12 hr tablet Take 1 tablet by mouth 2 (two) times daily.    . ondansetron (ZOFRAN-ODT) 4 MG disintegrating tablet Take 4 mg by mouth every 8 (eight) hours as needed for nausea or vomiting.    . pioglitazone (ACTOS) 15 MG tablet Take by mouth. Take 1 tablet by mouth once daily    . promethazine (PHENERGAN) 25 MG tablet Take 25 mg by mouth every 8 (eight) hours as needed for nausea or vomiting.    . propranolol (INDERAL) 10 MG tablet Take 1 tablet by mouth 3 (three) times daily.    . quinapril (ACCUPRIL) 10 MG tablet Take 10 mg by mouth daily.    . ranitidine (ZANTAC) 150 MG tablet Take 150 mg by mouth 2 (two) times daily.    . rizatriptan (MAXALT) 10 MG tablet Take 1 tab at migraine onset. May repeat in 2 hours prn.  Limit to 2 in 24 hours and to 2 days weekly.    . sitaGLIPtin (JANUVIA) 100 MG tablet Take 100 mg by mouth daily.    . traZODone (DESYREL) 50 MG tablet Take 50 mg by mouth daily as needed.    . verapamil (CALAN-SR) 240 MG CR tablet Take 1 tablet by mouth daily.    . ARIPiprazole (ABILIFY) 5 MG tablet Take 1 tablet (5 mg total) by mouth daily. 90 tablet 0  . dabigatran (PRADAXA) 150 MG CAPS capsule Take 150 mg by mouth 2 (two) times daily.    Marland Kitchen  lamoTRIgine (LAMICTAL) 25 MG tablet Take 1 tablet (25 mg total) by mouth daily. To be combined with 100 mg 90 tablet 0  . metoCLOPramide (REGLAN) 10 MG tablet Take 1 tablet (10 mg total) by mouth every 8 (eight) hours as needed for up to 3 days for nausea. 20 tablet 0  . zolpidem (AMBIEN) 10 MG tablet Take 10 mg by mouth daily.     No current facility-administered medications for this visit.      Musculoskeletal: Strength & Muscle Tone: within normal limits Gait & Station: normal Patient leans: N/A  Psychiatric Specialty Exam: Review of Systems  Psychiatric/Behavioral: Positive for depression. The patient is nervous/anxious.   All other systems  reviewed and are negative.   Blood pressure (!) 142/88, pulse 97, weight 227 lb 12.8 oz (103.3 kg).Body mass index is 36.77 kg/m.  General Appearance: Casual  Eye Contact:  Fair  Speech:  Normal Rate  Volume:  Normal  Mood:  Anxious and Depressed  Affect:  Congruent  Thought Process:  Goal Directed and Descriptions of Associations: Intact  Orientation:  Full (Time, Place, and Person)  Thought Content: Logical   Suicidal Thoughts:  No  Homicidal Thoughts:  No  Memory:  Immediate;   Fair Recent;   Fair Remote;   Fair  Judgement:  Fair  Insight:  Fair  Psychomotor Activity:  Normal  Concentration:  Concentration: Fair and Attention Span: Fair  Recall:  [employer] of Knowledge: Fair  Language: Fair  Akathisia:  No  Handed:  Right  AIMS (if indicated): 0  Assets:  Communication Skills Desire for Improvement Housing  ADL's:  Intact  Cognition: WNL  Sleep:  Fair   Screenings:   Assessment and Plan: Dorrine is a 52 year old Caucasian female who has a history of depression, PTSD, multiple medical problems including chronic pain, migraine, history of CVA, OSA noncompliant with CPAP, prothrombin gene mutation, presented to the clinic today for a follow-up visit.  Patient reports some worsening anxiety symptoms.  She does have psychosocial stressors of recent health problems.  Discussed medication changes as noted below.  Plan MDD Continue Cymbalta 90 mg p.o. daily Increase Lamictal to 125 mg p.o. daily Increase Abilify to 5 mg p.o. daily Continue Wellbutrin XL 150 mg p.o. Daily.  PTSD Continue Cymbalta 90 mg p.o. daily. Continue psychotherapy.  For insomnia Continue trazodone as prescribed.  To continue psychotherapy visits with Ms. Peacock.  Follow-up in clinic in 1 month or sooner if needed.  More than 50 % of the time was spent for psychoeducation and supportive psychotherapy and care coordination.  This note was generated in part or whole with voice recognition  software. Voice recognition is usually quite accurate but there are transcription errors that can and very often do occur. I apologize for any typographical errors that were not detected and corrected.      Ursula Alert, MD 01/13/2018, 12:29 PM

## 2018-01-17 ENCOUNTER — Ambulatory Visit: Payer: Medicare Other | Admitting: Physical Therapy

## 2018-01-17 ENCOUNTER — Encounter: Payer: Self-pay | Admitting: Physical Therapy

## 2018-01-17 DIAGNOSIS — R269 Unspecified abnormalities of gait and mobility: Secondary | ICD-10-CM

## 2018-01-17 DIAGNOSIS — R296 Repeated falls: Secondary | ICD-10-CM

## 2018-01-17 DIAGNOSIS — M6281 Muscle weakness (generalized): Secondary | ICD-10-CM

## 2018-01-17 NOTE — Therapy (Signed)
[facility] [address] [place], [place], [address] Phone: [phone]   Fax:  [phone]  Physical Therapy Treatment  Patient Details  Name: Christine Mcneil MRN: [id number] Date of Birth: 10-30-1965 Referring Provider: Dr. Jennings Books   Encounter Date: 01/17/2018  PT End of Session - 01/17/18 1904    Visit Number  14    Number of Visits  17    Date for PT Re-Evaluation  02/07/18    PT Start Time  1416    PT Stop Time  1509    PT Time Calculation (min)  53 min    Equipment Utilized During Treatment  Gait belt    Activity Tolerance  Patient tolerated treatment well    Behavior During Therapy  [facility] for tasks assessed/performed       Past Medical History:  Diagnosis Date  . Abdominal pain   . Anxiety and depression   . Atypical facial pain   . Bilateral occipital neuralgia   . Cervical spondylosis without myelopathy   . Chronic daily headache   . Chronic pain   . Chronic prostatitis   . Chronic thoracic back pain   . Depression   . Diabetes mellitus without complication ([place])   . Diabetes mellitus, type II ([place])   . Dysphagia   . Fibromyalgia   . Hyperlipidemia   . Hypertension   . Insomnia   . Iron deficiency anemia   . Left hip pain   . Low back pain   . Migraines   . Numbness   . Optic neuropathy   . OSA (obstructive sleep apnea)   . Polyarthralgia   . Primary osteoarthritis of both hips   . Prothrombin gene mutation ([place])   . Pseudotumor cerebri   . Rectal bleeding   . Right shoulder pain   . Rotator cuff syndrome   . Thyroid disease   . TIA (transient ischemic attack) 05/27/2017    Past Surgical History:  Procedure Laterality Date  . ABDOMINAL HYSTERECTOMY     total  . CHOLECYSTECTOMY    . KNEE SURGERY Left   . ROTATOR CUFF REPAIR Right     There were no vitals filed for this visit.  Subjective Assessment - 01/17/18 1513    Subjective  Pt. reports she has spent a lot more time being upright and  performing activities around home.  Pt. did request a new certification for pain in back with pain MD.  Pt. will be checkingfor referral later today.      Currently in Pain?  Yes    Pain Score  2     Pain Location  Hip    Pain Orientation  Left;Right    Pain Descriptors / Indicators  Aching    Pain Type  Chronic pain        TREATMENT  There Ex:  Standing Hip Exercises (marching/ abduction/ extension/ hamstring curls/ heel raises) 5# 2x25 TG knee flexion 20x (midline with ball/ toe in/ toe out)/ Ambulating around gym with verbal cueing for increased step-length and heel strike x4. Reviewed/ discussed HEP.    NuStep for 10 min (unbilled)     PT Long Term Goals - 01/10/18 1354      PT LONG TERM GOAL #1   Title  Pt. will be independent with HEP to increase B LE muscle strength 1/2 muscle grade to improve standing tolerance/ gait.      Baseline  R/L Hip Flex: 4/4+; R/L Knee Ext:  5/5;     Time  4    Period  Weeks    Status  Achieved    Target Date  11/22/17      PT LONG TERM GOAL #2   Title  Pt. will increase FOTO from 32 to 40 to improve functional mobility.     Baseline  FOTO: 40 12/06/17    Time  4    Period  Weeks    Status  Achieved      PT LONG TERM GOAL #3   Title  Pt. will increase Berg balance score to >51/56 to decrease fall risk/ improve safety with gait.      Baseline  Berg 55/56 on 6/13    Time  4    Period  Weeks    Status  Achieved      PT LONG TERM GOAL #4   Title  Pt. able to ambulate outside on grassy terrain with no LOB to promote independence with gait.      Baseline  increase fall risk on grass/ falls reported while gardening    Time  4    Period  Weeks    Status  Achieved      PT LONG TERM GOAL #5   Title  Pt. will be able to perform SLS >30sec in B LE with no LOB to ensure ankle stability needed for ascending/descending stairs.    Baseline  Pt. able to hold SLS <5 sec without LOB.    Time  4    Period  Weeks    Status  Achieved    Target  Date  12/06/17      Additional Long Term Goals   Additional Long Term Goals  Yes      PT LONG TERM GOAL #6   Title  Pt. will improve FOTO to 48 to signify an increase in pain free functional mobility.    Baseline  FOTO: 40 on 12/06/17; FOTO: 44 on 01/10/18    Time  4    Period  Weeks    Status  Partially Met    Target Date  01/03/18      PT LONG TERM GOAL #7   Title  Pt. will report no falls in the next 4 weeks to demonstrate increased balance/ coordination with movement patterns.    Baseline  Reports a fall occurred 7/16    Time  4    Period  Weeks    Status  Achieved    Target Date  02/07/18      PT LONG TERM GOAL #8   Title  Pt. will tolerate standing no increase in pain for >30 minutes in order to cook in kitchen.    Baseline  Unable to stand for 10 minutes without increase in pain of hip.    Time  4    Period  Weeks    Status  New    Target Date  02/07/18            Plan - 01/17/18 1904    Clinical Impression Statement  Pt. consistently makes improvements and is progressing with therapeutic exercises adding more reps/ weights while at clinic.  Pt. still has slightly antalgic gait, however has improved since last session.  Pt. reports to feel more stable on feet and will continue to benefit from strengthening exercises to address balance deficits and weakness in B LE.      Clinical Presentation  Evolving    Clinical Decision Making  Moderate  Rehab Potential  Fair    PT Frequency  1x / week    PT Duration  4 weeks    PT Treatment/Interventions  ADLs/Self Care Home Management;Aquatic Therapy;Gait training;Moist Heat;Cryotherapy;Stair training;Functional mobility training;Therapeutic activities;Balance training;Therapeutic exercise;Patient/family education;Neuromuscular re-education;Manual techniques;Passive range of motion    PT Next Visit Plan  Assess HEP.    PT Home Exercise Plan  see HEP (revised with [employer])       Patient will benefit from skilled therapeutic  intervention in order to improve the following deficits and impairments:  Abnormal gait, Improper body mechanics, Pain, Postural dysfunction, Decreased coordination, Decreased activity tolerance, Decreased endurance, Decreased range of motion, Decreased strength, Obesity, Impaired flexibility, Difficulty walking, Decreased safety awareness, Decreased balance  Visit Diagnosis: Gait difficulty  Repeated falls  Muscle weakness (generalized)     Problem List Patient Active Problem List   Diagnosis Date Noted  . 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 TIA (transient ischemic attack) 06/20/2017  . Primary osteoarthritis of both hips 06/20/2017  . Periodic limb movements of sleep 05/30/2017  . Shortness of breath 01/03/2017  . 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 OSA (obstructive sleep apnea) 07/01/2016  . Chronic prostatitis/chronic pelvic pain syndrome 05/28/2016  . Migraine without aura and without status migrainosus, not intractable 05/11/2016  . Chronic anticoagulation 03/29/2016  . Encounter for monitoring opioid maintenance therapy 11/04/2015  . Dysphagia, neurologic 09/16/2015  . Numbness 09/16/2015  . Anti-cardiolipin antibody positive 09/01/2015  . Prothrombin gene mutation ([place]) 09/01/2015  . History of cerebral venous sinus thrombosis 06/27/2015  . Anemia, iron deficiency 10/02/2014  . Chronic thoracic back pain 07/24/2014  . Bilateral occipital neuralgia 04/11/2014  . Hypothyroidism, unspecified 10/17/2013  . Type 2 diabetes, HbA1c goal < 7% (HCC) 10/17/2013  . Cervicogenic headache 07/04/2013  . Cervical spondylosis without myelopathy 11/16/2012  . Rotator cuff syndrome 11/13/2012  . Sinus congestion 11/07/2012  . Abdominal pain 09/28/2012  . Rectal bleeding 09/28/2012  . Depression 02/22/2012  . GERD (gastroesophageal reflux disease) 02/22/2012  . Essential hypertension 02/22/2012  . Obesity, unspecified 02/22/2012  .  Neuromuscular disorder ([place]) 02/22/2012  . Pain in joint, pelvic region and thigh 01/17/2012  . Insomnia secondary to chronic pain 10/04/2011  . Right shoulder pain 10/04/2011  . Atypical facial pain 10/03/2011  . Chronic daily headache 10/03/2011  . Fibromyalgia 10/03/2011  . Hyperlipidemia, unspecified 10/03/2011   Pura Spice, PT, DPT # [id number] Gwenlyn Saran, SPT 01/17/2018, 7:07 PM  [facility] [address] [place], [place], [address] Phone: [phone]   Fax:  [phone]  Name: XINYI BATTON MRN: [id number] Date of Birth: November 20, 1965

## 2018-01-18 ENCOUNTER — Telehealth: Payer: Self-pay | Admitting: Oncology

## 2018-01-18 ENCOUNTER — Inpatient Hospital Stay: Payer: Medicare Other

## 2018-01-18 ENCOUNTER — Other Ambulatory Visit: Payer: Self-pay

## 2018-01-18 DIAGNOSIS — R778 Other specified abnormalities of plasma proteins: Secondary | ICD-10-CM

## 2018-01-18 DIAGNOSIS — D649 Anemia, unspecified: Secondary | ICD-10-CM

## 2018-01-18 DIAGNOSIS — D509 Iron deficiency anemia, unspecified: Secondary | ICD-10-CM | POA: Diagnosis not present

## 2018-01-18 LAB — IRON AND TIBC
Iron: 61 ug/dL (ref 28–170)
Saturation Ratios: 12 % (ref 10.4–31.8)
TIBC: 524 ug/dL — ABNORMAL HIGH (ref 250–450)
UIBC: 463 ug/dL

## 2018-01-18 LAB — FERRITIN: Ferritin: 7 ng/mL — ABNORMAL LOW (ref 11–307)

## 2018-01-18 NOTE — Telephone Encounter (Signed)
Called pt to notify that she needs labs redrawn. Pt accepted appointment for labs today at [place] pm.

## 2018-01-19 DIAGNOSIS — D509 Iron deficiency anemia, unspecified: Secondary | ICD-10-CM | POA: Insufficient documentation

## 2018-01-19 LAB — MULTIPLE MYELOMA PANEL, SERUM
ALBUMIN SERPL ELPH-MCNC: 4 g/dL (ref 2.9–4.4)
Albumin/Glob SerPl: 1.3 (ref 0.7–1.7)
Alpha 1: 0.2 g/dL (ref 0.0–0.4)
Alpha2 Glob SerPl Elph-Mcnc: 1 g/dL (ref 0.4–1.0)
B-Globulin SerPl Elph-Mcnc: 1.3 g/dL (ref 0.7–1.3)
Gamma Glob SerPl Elph-Mcnc: 0.9 g/dL (ref 0.4–1.8)
Globulin, Total: 3.3 g/dL (ref 2.2–3.9)
IGM (IMMUNOGLOBULIN M), SRM: 190 mg/dL (ref 26–217)
IgA: 225 mg/dL (ref 87–352)
IgG (Immunoglobin G), Serum: 822 mg/dL (ref 700–1600)
TOTAL PROTEIN ELP: 7.3 g/dL (ref 6.0–8.5)

## 2018-01-19 NOTE — Progress Notes (Signed)
Patient notified. Message sent to scheduling.  

## 2018-01-19 NOTE — Addendum Note (Signed)
Addended by: Earlie Server on: 01/19/2018 09:00 AM   Modules accepted: Orders

## 2018-01-20 ENCOUNTER — Ambulatory Visit: Payer: Medicare Other

## 2018-01-20 ENCOUNTER — Inpatient Hospital Stay: Payer: Medicare Other

## 2018-01-20 VITALS — BP 112/80 | HR 96 | Temp 96.2°F | Resp 18

## 2018-01-20 DIAGNOSIS — D509 Iron deficiency anemia, unspecified: Secondary | ICD-10-CM | POA: Diagnosis not present

## 2018-01-20 MED ORDER — SODIUM CHLORIDE 0.9 % IV SOLN
Freq: Once | INTRAVENOUS | Status: AC
  Administered 2018-01-20: 09:00:00 via INTRAVENOUS
  Filled 2018-01-20: qty 250

## 2018-01-20 MED ORDER — IRON SUCROSE 20 MG/ML IV SOLN
200.0000 mg | Freq: Once | INTRAVENOUS | Status: AC
  Administered 2018-01-20: 200 mg via INTRAVENOUS
  Filled 2018-01-20: qty 10

## 2018-01-20 MED ORDER — SODIUM CHLORIDE 0.9 % IV SOLN: 200.0000 mg | Freq: Once | INTRAVENOUS | Status: DC

## 2018-01-25 ENCOUNTER — Encounter: Payer: Self-pay | Admitting: Physical Therapy

## 2018-01-25 ENCOUNTER — Ambulatory Visit: Payer: Medicare Other | Attending: Neurology | Admitting: Physical Therapy

## 2018-01-25 DIAGNOSIS — R269 Unspecified abnormalities of gait and mobility: Secondary | ICD-10-CM | POA: Insufficient documentation

## 2018-01-25 DIAGNOSIS — R296 Repeated falls: Secondary | ICD-10-CM | POA: Insufficient documentation

## 2018-01-25 DIAGNOSIS — M6281 Muscle weakness (generalized): Secondary | ICD-10-CM | POA: Insufficient documentation

## 2018-01-25 NOTE — Therapy (Signed)
[facility] [address] [place], [place], [address] Phone: [phone]   Fax:  [phone]  Physical Therapy Treatment  Patient Details  Name: Christine Mcneil MRN: [id number] Date of Birth: 14-Oct-1965 Referring Provider: Dr. Jennings Books   Encounter Date: 01/25/2018    Treatment: 54 of 17.   Recert date: 6/00/45    Past Medical History:  Diagnosis Date  . Abdominal pain   . Anxiety and depression   . Atypical facial pain   . Bilateral occipital neuralgia   . Cervical spondylosis without myelopathy   . Chronic daily headache   . Chronic pain   . Chronic prostatitis   . Chronic thoracic back pain   . Depression   . Diabetes mellitus without complication ([place])   . Diabetes mellitus, type II ([place])   . Dysphagia   . Fibromyalgia   . Hyperlipidemia   . Hypertension   . Insomnia   . Iron deficiency anemia   . Left hip pain   . Low back pain   . Migraines   . Numbness   . Optic neuropathy   . OSA (obstructive sleep apnea)   . Polyarthralgia   . Primary osteoarthritis of both hips   . Prothrombin gene mutation ([place])   . Pseudotumor cerebri   . Rectal bleeding   . Right shoulder pain   . Rotator cuff syndrome   . Thyroid disease   . TIA (transient ischemic attack) 05/27/2017    Past Surgical History:  Procedure Laterality Date  . ABDOMINAL HYSTERECTOMY     total  . CHOLECYSTECTOMY    . KNEE SURGERY Left   . ROTATOR CUFF REPAIR Right     There were no vitals filed for this visit.    Pt. reports that she has lost 3 pounds in the past week. Pt. reports she received an iron infusion last Thursday and has not been eating ice nearly as much as prior to infusion. Pt. reports that she was aching for a long period of time. Pt. reports no new complaints today upon entering the clinic.       TREATMENT  There Ex:  TG knee flexion20x(midlinewith ball/ toe in/ toe out)/ Ambulating around gym with verbal cueing  for increased step-length and heel strike x4. Resisted walking with Nautilus (Front/Back 110#, Lateral Each 80#) x5 each Stair climbing with reciprocal gait pattern and min use of UE for support x5   Pt. Still requires verbal cueing for wider BOS and heel strike.  NuStep for 10 min (unbilled)      Pt. performed well with resisted walking exercises along with TG exercises. Pt. continues to make significant improvements with strengthening exercises, however still has difficulty with narrow BOS when ambulating. Pt. will benefit from gait training in near future as well. Pt. will continue to benefit from skilled therapy in order to strengthen B LE needed for ambulation without falls.  PT Long Term Goals - 01/10/18 1354      PT LONG TERM GOAL #1   Title  Pt. will be independent with HEP to increase B LE muscle strength 1/2 muscle grade to improve standing tolerance/ gait.      Baseline  R/L Hip Flex: 4/4+; R/L Knee Ext: 5/5;     Time  4    Period  Weeks    Status  Achieved    Target Date  11/22/17      PT LONG TERM GOAL #2   Title  Pt. will increase FOTO from 32 to 40 to improve functional mobility.     Baseline  FOTO: 40 12/06/17    Time  4    Period  Weeks    Status  Achieved      PT LONG TERM GOAL #3   Title  Pt. will increase Berg balance score to >51/56 to decrease fall risk/ improve safety with gait.      Baseline  Berg 55/56 on 6/13    Time  4    Period  Weeks    Status  Achieved      PT LONG TERM GOAL #4   Title  Pt. able to ambulate outside on grassy terrain with no LOB to promote independence with gait.      Baseline  increase fall risk on grass/ falls reported while gardening    Time  4    Period  Weeks    Status  Achieved      PT LONG TERM GOAL #5   Title  Pt. will be able to perform SLS >30sec in B LE with no LOB to ensure ankle stability needed for ascending/descending stairs.    Baseline  Pt. able to hold SLS <5 sec without LOB.    Time  4    Period  Weeks     Status  Achieved    Target Date  12/06/17      Additional Long Term Goals   Additional Long Term Goals  Yes      PT LONG TERM GOAL #6   Title  Pt. will improve FOTO to 48 to signify an increase in pain free functional mobility.    Baseline  FOTO: 40 on 12/06/17; FOTO: 44 on 01/10/18    Time  4    Period  Weeks    Status  Partially Met    Target Date  01/03/18      PT LONG TERM GOAL #7   Title  Pt. will report no falls in the next 4 weeks to demonstrate increased balance/ coordination with movement patterns.    Baseline  Reports a fall occurred 7/16    Time  4    Period  Weeks    Status  Achieved    Target Date  02/07/18      PT LONG TERM GOAL #8   Title  Pt. will tolerate standing no increase in pain for >30 minutes in order to cook in kitchen.    Baseline  Unable to stand for 10 minutes without increase in pain of hip.    Time  4    Period  Weeks    Status  New    Target Date  02/07/18          Patient will benefit from skilled therapeutic intervention in order to improve the following deficits and impairments:  Abnormal gait, Improper body mechanics, Pain, Postural dysfunction, Decreased coordination, Decreased activity tolerance, Decreased endurance, Decreased range of motion, Decreased strength, Obesity, Impaired flexibility, Difficulty walking, Decreased safety awareness, Decreased balance  Visit Diagnosis: Gait difficulty  Repeated falls  Muscle weakness (generalized)     Problem List Patient Active Problem List   Diagnosis Date Noted  . Iron deficiency anemia 01/19/2018  . 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 TIA (transient ischemic attack) 06/20/2017  . Primary osteoarthritis of both hips 06/20/2017  . Periodic limb movements of sleep 05/30/2017  . Shortness of breath 01/03/2017  . 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  OSA (obstructive sleep apnea) 07/01/2016  . Chronic prostatitis/chronic pelvic pain syndrome  05/28/2016  . Migraine without aura and without status migrainosus, not intractable 05/11/2016  . Chronic anticoagulation 03/29/2016  . Encounter for monitoring opioid maintenance therapy 11/04/2015  . Dysphagia, neurologic 09/16/2015  . Numbness 09/16/2015  . Anti-cardiolipin antibody positive 09/01/2015  . Prothrombin gene mutation ([place]) 09/01/2015  . History of cerebral venous sinus thrombosis 06/27/2015  . Anemia, iron deficiency 10/02/2014  . Chronic thoracic back pain 07/24/2014  . Bilateral occipital neuralgia 04/11/2014  . Hypothyroidism, unspecified 10/17/2013  . Type 2 diabetes, HbA1c goal < 7% (HCC) 10/17/2013  . Cervicogenic headache 07/04/2013  . Cervical spondylosis without myelopathy 11/16/2012  . Rotator cuff syndrome 11/13/2012  . Sinus congestion 11/07/2012  . Abdominal pain 09/28/2012  . Rectal bleeding 09/28/2012  . Depression 02/22/2012  . GERD (gastroesophageal reflux disease) 02/22/2012  . Essential hypertension 02/22/2012  . Obesity, unspecified 02/22/2012  . Neuromuscular disorder ([place]) 02/22/2012  . Pain in joint, pelvic region and thigh 01/17/2012  . Insomnia secondary to chronic pain 10/04/2011  . Right shoulder pain 10/04/2011  . Atypical facial pain 10/03/2011  . Chronic daily headache 10/03/2011  . Fibromyalgia 10/03/2011  . Hyperlipidemia, unspecified 10/03/2011   Pura Spice, PT, DPT # [id number] Gwenlyn Saran, SPT 01/27/2018, 3:52 PM  [facility] [address] [place], [place], [address] Phone: [phone]   Fax:  [phone]  Name: Christine Mcneil MRN: [id number] Date of Birth: 09/14/1965

## 2018-01-26 ENCOUNTER — Inpatient Hospital Stay: Payer: Medicare Other | Attending: Oncology

## 2018-01-26 VITALS — BP 137/91 | HR 96 | Temp 95.0°F | Resp 18

## 2018-01-26 DIAGNOSIS — D509 Iron deficiency anemia, unspecified: Secondary | ICD-10-CM | POA: Insufficient documentation

## 2018-01-26 MED ORDER — SODIUM CHLORIDE 0.9 % IV SOLN
Freq: Once | INTRAVENOUS | Status: AC
  Administered 2018-01-26: 09:00:00 via INTRAVENOUS
  Filled 2018-01-26: qty 250

## 2018-01-26 MED ORDER — SODIUM CHLORIDE 0.9 % IV SOLN: 200.0000 mg | Freq: Once | INTRAVENOUS | Status: DC

## 2018-01-26 MED ORDER — IRON SUCROSE 20 MG/ML IV SOLN
200.0000 mg | Freq: Once | INTRAVENOUS | Status: AC
  Administered 2018-01-26: 200 mg via INTRAVENOUS
  Filled 2018-01-26 (×2): qty 10

## 2018-01-26 NOTE — Patient Instructions (Signed)
Iron Sucrose injection What is this medicine? IRON SUCROSE (AHY ern SOO krohs) is an iron complex. Iron is used to make healthy red blood cells, which carry oxygen and nutrients throughout the body. This medicine is used to treat iron deficiency anemia in people with chronic kidney disease. This medicine may be used for other purposes; ask your health care provider or pharmacist if you have questions. COMMON BRAND NAME(S): Venofer What should I tell my health care provider before I take this medicine? They need to know if you have any of these conditions: -anemia not caused by low iron levels -heart disease -high levels of iron in the blood -kidney disease -liver disease -an unusual or allergic reaction to iron, other medicines, foods, dyes, or preservatives -pregnant or trying to get pregnant -breast-feeding How should I use this medicine? This medicine is for infusion into a vein. It is given by a health care professional in a hospital or clinic setting. Talk to your pediatrician regarding the use of this medicine in children. While this drug may be prescribed for children as young as 2 years for selected conditions, precautions do apply. Overdosage: If you think you have taken too much of this medicine contact a poison control center or emergency room at once. NOTE: This medicine is only for you. Do not share this medicine with others. What if I miss a dose? It is important not to miss your dose. Call your doctor or health care professional if you are unable to keep an appointment. What may interact with this medicine? Do not take this medicine with any of the following medications: -deferoxamine -dimercaprol -other iron products This medicine may also interact with the following medications: -chloramphenicol -deferasirox This list may not describe all possible interactions. Give your health care provider a list of all the medicines, herbs, non-prescription drugs, or dietary  supplements you use. Also tell them if you smoke, drink alcohol, or use illegal drugs. Some items may interact with your medicine. What should I watch for while using this medicine? Visit your doctor or healthcare professional regularly. Tell your doctor or healthcare professional if your symptoms do not start to get better or if they get worse. You may need blood work done while you are taking this medicine. You may need to follow a special diet. Talk to your doctor. Foods that contain iron include: whole grains/cereals, dried fruits, beans, or peas, leafy green vegetables, and organ meats (liver, kidney). What side effects may I notice from receiving this medicine? Side effects that you should report to your doctor or health care professional as soon as possible: -allergic reactions like skin rash, itching or hives, swelling of the face, lips, or tongue -breathing problems -changes in blood pressure -cough -fast, irregular heartbeat -feeling faint or lightheaded, falls -fever or chills -flushing, sweating, or hot feelings -joint or muscle aches/pains -seizures -swelling of the ankles or feet -unusually weak or tired Side effects that usually do not require medical attention (report to your doctor or health care professional if they continue or are bothersome): -diarrhea -feeling achy -headache -irritation at site where injected -nausea, vomiting -stomach upset -tiredness This list may not describe all possible side effects. Call your doctor for medical advice about side effects. You may report side effects to FDA at 1-800-FDA-1088. Where should I keep my medicine? This drug is given in a hospital or clinic and will not be stored at home. NOTE: This sheet is a summary. It may not cover all possible information. If   you have questions about this medicine, talk to your doctor, pharmacist, or health care provider.  2018 Elsevier/Gold Standard (2011-02-18 17:14:35)  

## 2018-01-30 ENCOUNTER — Encounter: Payer: Medicare Other | Admitting: Physical Therapy

## 2018-02-02 ENCOUNTER — Inpatient Hospital Stay: Payer: Medicare Other

## 2018-02-02 VITALS — BP 134/84 | HR 96 | Resp 18

## 2018-02-02 DIAGNOSIS — D509 Iron deficiency anemia, unspecified: Secondary | ICD-10-CM | POA: Diagnosis not present

## 2018-02-02 MED ORDER — SODIUM CHLORIDE 0.9 % IV SOLN
Freq: Once | INTRAVENOUS | Status: AC
  Administered 2018-02-02: 10:00:00 via INTRAVENOUS
  Filled 2018-02-02: qty 250

## 2018-02-02 MED ORDER — IRON SUCROSE 20 MG/ML IV SOLN
200.0000 mg | Freq: Once | INTRAVENOUS | Status: AC
  Administered 2018-02-02: 200 mg via INTRAVENOUS
  Filled 2018-02-02 (×2): qty 10

## 2018-02-02 MED ORDER — SODIUM CHLORIDE 0.9 % IV SOLN: 200.0000 mg | Freq: Once | INTRAVENOUS | Status: DC

## 2018-02-06 ENCOUNTER — Ambulatory Visit (INDEPENDENT_AMBULATORY_CARE_PROVIDER_SITE_OTHER): Payer: Medicare Other | Admitting: Licensed Clinical Social Worker

## 2018-02-06 ENCOUNTER — Ambulatory Visit: Payer: Medicare Other | Admitting: Physical Therapy

## 2018-02-06 DIAGNOSIS — R269 Unspecified abnormalities of gait and mobility: Secondary | ICD-10-CM | POA: Diagnosis not present

## 2018-02-06 DIAGNOSIS — F4310 Post-traumatic stress disorder, unspecified: Secondary | ICD-10-CM | POA: Diagnosis not present

## 2018-02-06 DIAGNOSIS — R296 Repeated falls: Secondary | ICD-10-CM

## 2018-02-06 DIAGNOSIS — M6281 Muscle weakness (generalized): Secondary | ICD-10-CM

## 2018-02-06 DIAGNOSIS — F331 Major depressive disorder, recurrent, moderate: Secondary | ICD-10-CM | POA: Diagnosis not present
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
## 2018-02-07 ENCOUNTER — Encounter: Payer: Self-pay | Admitting: Physical Therapy

## 2018-02-09 ENCOUNTER — Ambulatory Visit: Payer: Medicare Other | Admitting: Licensed Clinical Social Worker

## 2018-02-09 ENCOUNTER — Inpatient Hospital Stay: Payer: Medicare Other

## 2018-02-09 VITALS — BP 134/90 | HR 98 | Resp 18

## 2018-02-09 DIAGNOSIS — D509 Iron deficiency anemia, unspecified: Secondary | ICD-10-CM | POA: Diagnosis not present

## 2018-02-09 MED ORDER — IRON SUCROSE 20 MG/ML IV SOLN
200.0000 mg | Freq: Once | INTRAVENOUS | Status: AC
  Administered 2018-02-09: 200 mg via INTRAVENOUS
  Filled 2018-02-09 (×2): qty 10

## 2018-02-09 MED ORDER — SODIUM CHLORIDE 0.9 % IV SOLN
Freq: Once | INTRAVENOUS | Status: AC
  Administered 2018-02-09: 10:00:00 via INTRAVENOUS
  Filled 2018-02-09: qty 250

## 2018-02-09 MED ORDER — SODIUM CHLORIDE 0.9 % IV SOLN: 200.0000 mg | Freq: Once | INTRAVENOUS | Status: DC

## 2018-02-09 NOTE — Patient Instructions (Signed)
Iron Sucrose injection What is this medicine? IRON SUCROSE (AHY ern SOO krohs) is an iron complex. Iron is used to make healthy red blood cells, which carry oxygen and nutrients throughout the body. This medicine is used to treat iron deficiency anemia in people with chronic kidney disease. This medicine may be used for other purposes; ask your health care provider or pharmacist if you have questions. COMMON BRAND NAME(S): Venofer What should I tell my health care provider before I take this medicine? They need to know if you have any of these conditions: -anemia not caused by low iron levels -heart disease -high levels of iron in the blood -kidney disease -liver disease -an unusual or allergic reaction to iron, other medicines, foods, dyes, or preservatives -pregnant or trying to get pregnant -breast-feeding How should I use this medicine? This medicine is for infusion into a vein. It is given by a health care professional in a hospital or clinic setting. Talk to your pediatrician regarding the use of this medicine in children. While this drug may be prescribed for children as young as 2 years for selected conditions, precautions do apply. Overdosage: If you think you have taken too much of this medicine contact a poison control center or emergency room at once. NOTE: This medicine is only for you. Do not share this medicine with others. What if I miss a dose? It is important not to miss your dose. Call your doctor or health care professional if you are unable to keep an appointment. What may interact with this medicine? Do not take this medicine with any of the following medications: -deferoxamine -dimercaprol -other iron products This medicine may also interact with the following medications: -chloramphenicol -deferasirox This list may not describe all possible interactions. Give your health care provider a list of all the medicines, herbs, non-prescription drugs, or dietary  supplements you use. Also tell them if you smoke, drink alcohol, or use illegal drugs. Some items may interact with your medicine. What should I watch for while using this medicine? Visit your doctor or healthcare professional regularly. Tell your doctor or healthcare professional if your symptoms do not start to get better or if they get worse. You may need blood work done while you are taking this medicine. You may need to follow a special diet. Talk to your doctor. Foods that contain iron include: whole grains/cereals, dried fruits, beans, or peas, leafy green vegetables, and organ meats (liver, kidney). What side effects may I notice from receiving this medicine? Side effects that you should report to your doctor or health care professional as soon as possible: -allergic reactions like skin rash, itching or hives, swelling of the face, lips, or tongue -breathing problems -changes in blood pressure -cough -fast, irregular heartbeat -feeling faint or lightheaded, falls -fever or chills -flushing, sweating, or hot feelings -joint or muscle aches/pains -seizures -swelling of the ankles or feet -unusually weak or tired Side effects that usually do not require medical attention (report to your doctor or health care professional if they continue or are bothersome): -diarrhea -feeling achy -headache -irritation at site where injected -nausea, vomiting -stomach upset -tiredness This list may not describe all possible side effects. Call your doctor for medical advice about side effects. You may report side effects to FDA at 1-800-FDA-1088. Where should I keep my medicine? This drug is given in a hospital or clinic and will not be stored at home. NOTE: This sheet is a summary. It may not cover all possible information. If   you have questions about this medicine, talk to your doctor, pharmacist, or health care provider.  2018 Elsevier/Gold Standard (2011-02-18 17:14:35)  

## 2018-02-13 ENCOUNTER — Other Ambulatory Visit: Payer: Self-pay

## 2018-02-13 ENCOUNTER — Encounter: Payer: Self-pay | Admitting: Psychiatry

## 2018-02-13 ENCOUNTER — Ambulatory Visit (INDEPENDENT_AMBULATORY_CARE_PROVIDER_SITE_OTHER): Payer: Medicare Other | Admitting: Psychiatry

## 2018-02-13 VITALS — BP 131/84 | HR 116 | Temp 98.0°F | Wt 227.0 lb

## 2018-02-13 DIAGNOSIS — F4310 Post-traumatic stress disorder, unspecified: Secondary | ICD-10-CM

## 2018-02-13 DIAGNOSIS — F5105 Insomnia due to other mental disorder: Secondary | ICD-10-CM

## 2018-02-13 DIAGNOSIS — F331 Major depressive disorder, recurrent, moderate: Secondary | ICD-10-CM | POA: Diagnosis not present

## 2018-02-13 MED ORDER — TRAZODONE HCL 50 MG PO TABS: 50.0000 mg | ORAL_TABLET | Freq: Every evening | ORAL | 0 refills | Status: DC | PRN

## 2018-02-13 NOTE — Progress Notes (Signed)
   THERAPIST PROGRESS NOTE  Session Time: 61min  Participation Level: Active  Behavioral Response: CasualAlertEuthymic  Type of Therapy: Individual Therapy  Treatment Goals addressed: Coping  Interventions: CBT and Motivational Interviewing  Summary: Christine Mcneil is a 52 y.o. female who presents with continued symptoms of her diagnosis.  Provided support for Patient as she discussed her current mood. Stressed the importance of mood stabilization through learned coping skills and medication management.  Encouraged Patient to focus on her strengths and the positive aspects.   Suicidal/Homicidal: No  Plan: Return again in 2 weeks.  Diagnosis: Axis I: Depression    Axis II: No diagnosis    [place]tha [place], LCSW 01/11/2018

## 2018-02-13 NOTE — Progress Notes (Signed)
BH MD OP Progress Note  02/13/2018 1:11 PM Christine Mcneil  MRN:  [id number]  Chief Complaint: ' I am here for follow up." Chief Complaint    Follow-up; Medication Refill     HPI: Mirta is a 52-year-old Caucasian female, divorced, lives in [place], on SSD, has a history of depression, anger issues, PTSD, sleep problems, OSA-resolved, history of CVA, history of prothrombin gene mutation, chronic pain, migraine headaches, iron deficiency, presented to the clinic today for a follow-up visit.  Patient today reports she is improving with regards to her mood symptoms.  She reports her most recent dosage increase with the Lamictal and the Abilify has been very helpful.  She is tolerating the medication well.  She denies any side effects.  Patient reports sleep is improved.  She reports she continues to take trazodone as needed.  She reports she is aware that she should not combine it with Ambien.  She does not use the Ambien as much and it is being prescribed by her pain provider.  Patient reports she is currently on iron replacement.  She reports that has really helped her to feel better.  She continues to need investigation for what is causing her iron deficiency anemia and has upcoming appointment scheduled.  She reports her boyfriend is supportive.  Patient denies any suicidality, homicidality or perceptual disturbances. Visit Diagnosis:    ICD-10-CM   1. MDD (major depressive disorder), recurrent episode, moderate (HCC) F33.1   2. PTSD (post-traumatic stress disorder) F43.10   3. Insomnia due to mental disorder F51.05     Past Psychiatric History: I have reviewed past psychiatric history from my progress note on 08/16/2017.  Past trials of Effexor, Klonopin.  Past Medical History:  Past Medical History:  Diagnosis Date  . Abdominal pain   . Anxiety and depression   . Atypical facial pain   . Bilateral occipital neuralgia   . Cervical spondylosis without myelopathy   . Chronic daily  headache   . Chronic pain   . Chronic prostatitis   . Chronic thoracic back pain   . Depression   . Diabetes mellitus without complication (HCC)   . Diabetes mellitus, type II (HCC)   . Dysphagia   . Fibromyalgia   . Hyperlipidemia   . Hypertension   . Insomnia   . Iron deficiency anemia   . Left hip pain   . Low back pain   . Migraines   . Numbness   . Optic neuropathy   . OSA (obstructive sleep apnea)   . Polyarthralgia   . Primary osteoarthritis of both hips   . Prothrombin gene mutation (HCC)   . Pseudotumor cerebri   . Rectal bleeding   . Right shoulder pain   . Rotator cuff syndrome   . Thyroid disease   . TIA (transient ischemic attack) 05/27/2017    Past Surgical History:  Procedure Laterality Date  . ABDOMINAL HYSTERECTOMY     total  . CHOLECYSTECTOMY    . KNEE SURGERY Left   . ROTATOR CUFF REPAIR Right     Family Psychiatric History: I have reviewed family psychiatric history from my progress note on 08/16/2017.  Family History:  Family History  Problem Relation Age of Onset  . Diabetes Mother   . Hyperlipidemia Mother   . Hypertension Mother   . COPD Mother   . CVA Mother   . Anemia Mother   . Diabetes Father   . Hyperlipidemia Father   . Hypertension Father   .   Thyroid disease Father   . Alcohol abuse Father   . Peripheral vascular disease Sister   . Hypertension Sister   . Anxiety disorder Son   . Depression Son   . Bipolar disorder Son   . Seizures Son     Social History: Reviewed social history from my progress note on 08/16/2017 Social History   Socioeconomic History  . Marital status: Divorced    Spouse name: Not on file  . Number of children: 1  . Years of education: Not on file  . Highest education level: High school graduate  Occupational History    Comment: disablitiy  Social Needs  . Financial resource strain: Not hard at all  . Food insecurity:    Worry: Never true    Inability: Never true  . Transportation needs:     Medical: No    Non-medical: No  Tobacco Use  . Smoking status: Never Smoker  . Smokeless tobacco: Never Used  Substance and Sexual Activity  . Alcohol use: No  . Drug use: No  . Sexual activity: Not Currently  Lifestyle  . Physical activity:    Days per week: 0 days    Minutes per session: 0 min  . Stress: Very much  Relationships  . Social connections:    Talks on phone: Once a week    Gets together: Never    Attends religious service: More than 4 times per year    Active member of club or organization: No    Attends meetings of clubs or organizations: Never    Relationship status: Divorced  Other Topics Concern  . Not on file  Social History Narrative  . Not on file    Allergies:  Allergies  Allergen Reactions  . Amoxicillin Itching  . Amoxapine Itching  . Keflex [Cephalexin] Itching    Metabolic Disorder Labs: Lab Results  Component Value Date   HGBA1C 9.9 (H) 06/21/2017   MPG 237.43 06/21/2017   Lab Results  Component Value Date   PROLACTIN 5.5 09/06/2017   Lab Results  Component Value Date   CHOL 277 (H) 06/21/2017   TRIG 196 (H) 06/21/2017   HDL 64 06/21/2017   CHOLHDL 4.3 06/21/2017   VLDL 39 06/21/2017   LDLCALC 174 (H) 06/21/2017   Lab Results  Component Value Date   TSH 2.890 09/06/2017   TSH 3.176 01/04/2017    Therapeutic Level Labs: No results found for: LITHIUM No results found for: VALPROATE No components found for:  CBMZ  Current Medications: Current Outpatient Medications  Medication Sig Dispense Refill  . ACCU-CHEK SOFTCLIX LANCETS lancets     . acetaZOLAMIDE (DIAMOX) 125 MG tablet     . AIMOVIG 70 MG/ML SOAJ Inject 70 mg into the skin every 28 (twenty-eight) days.    . ARIPiprazole (ABILIFY) 5 MG tablet Take 1 tablet (5 mg total) by mouth daily. 90 tablet 0  . aspirin-acetaminophen-caffeine (EXCEDRIN MIGRAINE) 250-250-65 MG tablet Take 2 tablets by mouth every 8 (eight) hours as needed for headache or migraine.     Marland Kitchen  atorvastatin (LIPITOR) 80 MG tablet Take 80 mg by mouth daily at 6 PM.    . Blood Glucose Monitoring Suppl (ACCU-CHEK AVIVA PLUS) w/Device KIT     . buPROPion (WELLBUTRIN XL) 150 MG 24 hr tablet Take 1 tablet (150 mg total) by mouth daily. 90 tablet 0  . Carboxymethylcellulose Sod PF (REFRESH PLUS) 0.5 % SOLN Apply to eye.    . Cholecalciferol (VITAMIN D3) 1000 units CAPS  Take 1 capsule by mouth daily.    . clonazePAM (KLONOPIN) 0.5 MG tablet May use up to once daily as needed for facial pain    . clotrimazole (LOTRIMIN) 1 % cream     . cyclobenzaprine (FLEXERIL) 5 MG tablet     . dabigatran (PRADAXA) 150 MG CAPS capsule Take by mouth.    . diclofenac (FLECTOR) 1.3 % PTCH     . diltiazem (CARDIZEM CD) 240 MG 24 hr capsule Take by mouth. Take 1 capsule by mouth once daily    . DULoxetine (CYMBALTA) 30 MG capsule Take 3 capsules (90 mg total) by mouth daily. 270 capsule 0  . esomeprazole (NEXIUM) 40 MG capsule Take 40 mg by mouth daily.    . ferrous sulfate 325 (65 FE) MG tablet Take by mouth. Take 325 mg daily with breakfast    . fluticasone (FLONASE) 50 MCG/ACT nasal spray     . glipiZIDE (GLUCOTROL) 5 MG tablet Take 1 tablet (5 mg total) by mouth 2 (two) times daily before a meal. 60 tablet 0  . hydrOXYzine (ATARAX/VISTARIL) 50 MG tablet Take 1 tablet (50 mg total) by mouth 3 (three) times daily as needed for anxiety. 270 tablet 0  . ketoconazole (NIZORAL) 2 % cream 1 application.    Marland Kitchen lamoTRIgine (LAMICTAL) 100 MG tablet To be combined with 25 mg 90 tablet 0  . lamoTRIgine (LAMICTAL) 25 MG tablet Take 1 tablet (25 mg total) by mouth daily. To be combined with 100 mg 90 tablet 0  . levothyroxine (SYNTHROID, LEVOTHROID) 100 MCG tablet Take 100 mcg by mouth daily.    Marland Kitchen loratadine (CLARITIN) 10 MG tablet Take 10 mg by mouth daily as needed for allergies.    Marland Kitchen MAGNESIUM PO Take 500 mg by mouth 2 (two) times daily.    . metFORMIN (GLUCOPHAGE) 500 MG tablet Take 500 mg by mouth 2 (two) times daily.     Gean Birchwood ER 100 MG 12 hr tablet Take 1 tablet by mouth 2 (two) times daily.    . ondansetron (ZOFRAN-ODT) 4 MG disintegrating tablet Take 4 mg by mouth every 8 (eight) hours as needed for nausea or vomiting.    . pioglitazone (ACTOS) 15 MG tablet Take by mouth. Take 1 tablet by mouth once daily    . promethazine (PHENERGAN) 25 MG tablet Take 25 mg by mouth every 8 (eight) hours as needed for nausea or vomiting.    . propranolol (INDERAL) 10 MG tablet Take 1 tablet by mouth 3 (three) times daily.    . quinapril (ACCUPRIL) 10 MG tablet Take 10 mg by mouth daily.    . ranitidine (ZANTAC) 150 MG tablet Take 150 mg by mouth 2 (two) times daily.    . rizatriptan (MAXALT) 10 MG tablet Take 1 tab at migraine onset. May repeat in 2 hours prn.  Limit to 2 in 24 hours and to 2 days weekly.    . sitaGLIPtin (JANUVIA) 100 MG tablet Take 100 mg by mouth daily.    . traZODone (DESYREL) 50 MG tablet Take 1 tablet (50 mg total) by mouth at bedtime as needed. pls do not combine with Ambien 90 tablet 0  . verapamil (CALAN-SR) 240 MG CR tablet Take 1 tablet by mouth daily.    . dabigatran (PRADAXA) 150 MG CAPS capsule Take 150 mg by mouth 2 (two) times daily.    . metoCLOPramide (REGLAN) 10 MG tablet Take 1 tablet (10 mg total) by mouth every 8 (eight) hours as  needed for up to 3 days for nausea. 20 tablet 0  . zolpidem (AMBIEN) 10 MG tablet Take 10 mg by mouth daily.     No current facility-administered medications for this visit.      Musculoskeletal: Strength & Muscle Tone: within normal limits Gait & Station: normal Patient leans: N/A  Psychiatric Specialty Exam: Review of Systems  Psychiatric/Behavioral: The patient is nervous/anxious and has insomnia (improving).   All other systems reviewed and are negative.   Blood pressure 131/84, pulse (!) 116, temperature 98 F (36.7 C), temperature source Oral, weight 227 lb (103 kg).Body mass index is 36.64 kg/m.  General Appearance: Casual  Eye  Contact:  Fair  Speech:  Clear and Coherent  Volume:  Normal  Mood:  Anxious  Affect:  Congruent  Thought Process:  Goal Directed and Descriptions of Associations: Intact  Orientation:  Full (Time, Place, and Person)  Thought Content: Logical   Suicidal Thoughts:  No  Homicidal Thoughts:  No  Memory:  Immediate;   Fair Recent;   Fair Remote;   Fair  Judgement:  Fair  Insight:  Fair  Psychomotor Activity:  Normal  Concentration:  Concentration: Fair and Attention Span: Good  Recall:  [employer] of Knowledge: Fair  Language: Fair  Akathisia:  No  Handed:  Right  AIMS (if indicated): 0  Assets:  Communication Skills Desire for Improvement Social Support  ADL's:  Intact  Cognition: WNL  Sleep:  improving   Screenings:   Assessment and Plan: Christine Mcneil is a 52 year old Caucasian female who has a history of depression, PTSD, chronic pain, migraine headaches, history of CVA, iron deficiency anemia, prothrombin gene mutation, presented to the clinic today for a follow-up visit.  Patient reports her mood symptoms as improving on the recent medication changes.  We will continue medications as noted below.  Plan MDD Continue Cymbalta 90 mg p.o. daily Lamictal 125 mg p.o. daily Abilify 5 mg p.o. daily Wellbutrin XL 150 mg p.o. daily  PTSD Cymbalta 90 mg p.o. daily Continue psychotherapy  For insomnia Trazodone as prescribed. She is aware not to combine trazodone with Ambien.  She reports Ambien as being prescribed by her pain provider.  She will continue psychotherapy with Ms. Peacock.  Follow-up in clinic in 6 weeks or sooner if needed.  More than 50 % of the time was spent for psychoeducation and supportive psychotherapy and care coordination.  This note was generated in part or whole with voice recognition software. Voice recognition is usually quite accurate but there are transcription errors that can and very often do occur. I apologize for any typographical errors that  were not detected and corrected.         Ursula Alert, MD 02/13/2018, 1:11 PM

## 2018-02-20 ENCOUNTER — Ambulatory Visit: Payer: Medicare Other | Admitting: Physical Therapy

## 2018-02-20 ENCOUNTER — Encounter: Payer: Self-pay | Admitting: Physical Therapy

## 2018-02-20 DIAGNOSIS — M6281 Muscle weakness (generalized): Secondary | ICD-10-CM

## 2018-02-20 DIAGNOSIS — R296 Repeated falls: Secondary | ICD-10-CM

## 2018-02-20 DIAGNOSIS — R269 Unspecified abnormalities of gait and mobility: Secondary | ICD-10-CM | POA: Diagnosis not present

## 2018-02-20 NOTE — Progress Notes (Signed)
   THERAPIST PROGRESS NOTE  Session Time: 60 min  Participation Level: Active  Behavioral Response: CasualAlertEuthymic  Type of Therapy: Individual Therapy  Treatment Goals addressed: Coping  Interventions: CBT and Motivational Interviewing  Summary: Christine Mcneil is a 52 y.o. female who presents with a reduction in symptoms.  Patient vented her frustration about her current environment.  Therapist assisted with processing her relationship.  Patient reports that she and her boyfriend were able to talk about their concerns.  Patient reports that she and her boyfriend have began to talk daily and work on the relationship.  Assisted Patient with Communication skills and role played how to use.  Suicidal/Homicidal: No  Plan: Return again in 2 weeks.  Diagnosis: Axis I: Post Traumatic Stress Disorder and Depression    Axis II: No diagnosis    Lubertha South, LCSW 02/06/2018

## 2018-02-20 NOTE — Therapy (Signed)
[facility] [address] [place], [place], [address] Phone: [phone]   Fax:  [phone]  Physical Therapy Treatment  Patient Details  Name: Christine Mcneil MRN: [id number] Date of Birth: [birth year] Referring Provider (PT): Dr. Jennings Books   Encounter Date: 02/20/2018  PT End of Session - 02/20/18 1746    Visit Number  17    Number of Visits  21    Date for PT Re-Evaluation  04/17/18    PT Start Time  1341    PT Stop Time  1438    PT Time Calculation (min)  57 min    Equipment Utilized During Treatment  Gait belt    Activity Tolerance  Patient tolerated treatment well    Behavior During Therapy  [facility] for tasks assessed/performed       Past Medical History:  Diagnosis Date  . Abdominal pain   . Anxiety and depression   . Atypical facial pain   . Bilateral occipital neuralgia   . Cervical spondylosis without myelopathy   . Chronic daily headache   . Chronic pain   . Chronic prostatitis   . Chronic thoracic back pain   . Depression   . Diabetes mellitus without complication ([place])   . Diabetes mellitus, type II ([place])   . Dysphagia   . Fibromyalgia   . Hyperlipidemia   . Hypertension   . Insomnia   . Iron deficiency anemia   . Left hip pain   . Low back pain   . Migraines   . Numbness   . Optic neuropathy   . OSA (obstructive sleep apnea)   . Polyarthralgia   . Primary osteoarthritis of both hips   . Prothrombin gene mutation ([place])   . Pseudotumor cerebri   . Rectal bleeding   . Right shoulder pain   . Rotator cuff syndrome   . Thyroid disease   . TIA (transient ischemic attack) 05/27/2017    Past Surgical History:  Procedure Laterality Date  . ABDOMINAL HYSTERECTOMY     total  . CHOLECYSTECTOMY    . KNEE SURGERY Left   . ROTATOR CUFF REPAIR Right     There were no vitals filed for this visit.  Subjective Assessment - 02/20/18 1742    Subjective  Pt. reports that she is very stiff in her hips and  that she is having difficulty with pain management in her buttock area.  Pt. reports tenderness and tightness in gluteal region.    Pertinent History  Significant PMHx    Limitations  Standing;Walking;House hold activities    Patient Stated Goals  Increase LE strength/ balance/ independence with walking    Currently in Pain?  Yes    Pain Score  4     Pain Location  Hip    Pain Orientation  Right    Pain Descriptors / Indicators  Aching    Pain Type  Chronic pain    Pain Onset  More than a month ago         [facility] PT Assessment - 02/20/18 0001      Assessment   Medical Diagnosis  Fall/ Gait difficulty    Referring Provider (PT)  Dr. Jennings Books    Onset Date/Surgical Date  05/24/17      Balance Screen   Has the patient fallen in the past 6 months  Yes    How many times?  2+  (1 fall/month)      Prior Function  Level of Independence  Independent        Treatment:  Nustep L5 10 min. B UE/LE (warm-up/no charge).    Manual Therapy:   Supine B Distal Hamstring Stretch 4x30 sec each Supine B Proximal Hamstring Stretch 4x30 sec each Supine B Piriformis Stretch 8x30 sec each Supine B Figure-4 Stretch 4x30 sec each Supine B IR/ER Stretch 4x30 sec each Prone B Hip Joint Grade II-III mobs 2x30 sec each  Pt. Reported decrease in pain after mobs        PT Long Term Goals - 02/20/18 1753      PT LONG TERM GOAL #1   Title  Pt. will be independent with HEP to increase B LE muscle strength 1/2 muscle grade to improve standing tolerance/ gait.      Baseline  R/L Hip Flex: 4/4+; R/L Knee Ext: 5/5;     Time  4    Period  Weeks    Status  Achieved    Target Date  11/22/17      PT LONG TERM GOAL #2   Title  Pt. will increase FOTO from 32 to 40 to improve functional mobility.     Baseline  FOTO: 40 12/06/17    Time  4    Period  Weeks    Status  Achieved    Target Date  11/10/17      PT LONG TERM GOAL #3   Title  Pt. will increase Berg balance score to >51/56 to  decrease fall risk/ improve safety with gait.      Baseline  Berg 55/56 on 6/13    Time  4    Period  Weeks    Status  Achieved    Target Date  11/03/17      PT LONG TERM GOAL #4   Title  Pt. able to ambulate outside on grassy terrain with no LOB to promote independence with gait.      Baseline  increase fall risk on grass/ falls reported while gardening    Time  4    Period  Weeks    Status  Achieved    Target Date  11/22/17      PT LONG TERM GOAL #5   Title  Pt. will be able to perform SLS >30sec in B LE with no LOB to ensure ankle stability needed for ascending/descending stairs.    Baseline  Pt. able to hold SLS <5 sec without LOB.    Time  4    Period  Weeks    Status  Achieved    Target Date  12/06/17      PT LONG TERM GOAL #6   Title  Pt. will improve FOTO to 48 to signify an increase in pain free functional mobility.    Baseline  FOTO: 40 on 12/06/17; FOTO: 44 on 01/10/18    Time  4    Period  Weeks    Status  Unable to assess    Target Date  04/17/18      PT LONG TERM GOAL #7   Title  Pt. will report no falls in the next 4 weeks to demonstrate increased balance/ coordination with movement patterns.    Baseline  Reports a fall occurred 7/16; Pt. reported a fall on 9/14    Time  4    Period  Weeks    Status  Achieved    Target Date  02/07/18      PT LONG TERM GOAL #8  Title  Pt. will tolerate standing no increase in pain for >30 minutes in order to cook in kitchen.    Baseline  Unable to stand for 10 minutes without increase in pain of hip.; 9/30 Pt. reports that she is able to stand for 5 minutes without pain.    Time  4    Period  Weeks    Status  Partially Met    Target Date  04/17/18            Plan - 02/20/18 1748    Clinical Impression Statement  Pt. had considerable increase in tightness of B piriformis upon arrival to the clinic today.  Pt. responded well to joint mobs and stretches and noted a decrease in pain after leaving clinic.  Pt. was  educated on gym membership and aquatic class offerings that would be beneficial.  Pt. noted that she would be going to gym today to check out the classes.  Pt. has made significant progress towards goals and will continue to benefit from skilled therapy in order to decrease pain and increase balance to return to PLOF.    Clinical Presentation  Evolving    Clinical Decision Making  Moderate    Rehab Potential  Fair    PT Frequency  Biweekly    PT Duration  8 weeks    PT Treatment/Interventions  ADLs/Self Care Home Management;Aquatic Therapy;Gait training;Moist Heat;Cryotherapy;Stair training;Functional mobility training;Therapeutic activities;Balance training;Therapeutic exercise;Patient/family education;Neuromuscular re-education;Manual techniques;Passive range of motion    PT Next Visit Plan  Increase strengthening HEP and assess gym habits.    PT Home Exercise Plan  see HEP    Consulted and Agree with Plan of Care  Patient       Patient will benefit from skilled therapeutic intervention in order to improve the following deficits and impairments:  Abnormal gait, Improper body mechanics, Pain, Postural dysfunction, Decreased coordination, Decreased activity tolerance, Decreased endurance, Decreased range of motion, Decreased strength, Obesity, Impaired flexibility, Difficulty walking, Decreased safety awareness, Decreased balance  Visit Diagnosis: Gait difficulty  Repeated falls  Muscle weakness (generalized)     Problem List Patient Active Problem List   Diagnosis Date Noted  . Iron deficiency anemia 01/19/2018  . 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 TIA (transient ischemic attack) 06/20/2017  . Primary osteoarthritis of both hips 06/20/2017  . Periodic limb movements of sleep 05/30/2017  . Shortness of breath 01/03/2017  . 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 Chronic prostatitis/chronic pelvic pain syndrome 05/28/2016  . Migraine without aura  and without status migrainosus, not intractable 05/11/2016  . Chronic anticoagulation 03/29/2016  . Encounter for monitoring opioid maintenance therapy 11/04/2015  . Dysphagia, neurologic 09/16/2015  . Numbness 09/16/2015  . Anti-cardiolipin antibody positive 09/01/2015  . Prothrombin gene mutation ([place]) 09/01/2015  . History of cerebral venous sinus thrombosis 06/27/2015  . Anemia, iron deficiency 10/02/2014  . Chronic thoracic back pain 07/24/2014  . Bilateral occipital neuralgia 04/11/2014  . Hypothyroidism, unspecified 10/17/2013  . Type 2 diabetes, HbA1c goal < 7% (HCC) 10/17/2013  . Cervicogenic headache 07/04/2013  . Cervical spondylosis without myelopathy 11/16/2012  . Rotator cuff syndrome 11/13/2012  . Sinus congestion 11/07/2012  . Abdominal pain 09/28/2012  . Rectal bleeding 09/28/2012  . Depression 02/22/2012  . GERD (gastroesophageal reflux disease) 02/22/2012  . Essential hypertension 02/22/2012  . Obesity, unspecified 02/22/2012  . Neuromuscular disorder ([place]) 02/22/2012  . Pain in joint, pelvic region and thigh 01/17/2012  . Insomnia secondary to chronic  pain 10/04/2011  . Right shoulder pain 10/04/2011  . Atypical facial pain 10/03/2011  . Chronic daily headache 10/03/2011  . Fibromyalgia 10/03/2011  . Hyperlipidemia, unspecified 10/03/2011   Pura Spice, PT, DPT # [id number] Gwenlyn Saran, SPT 02/20/2018, 6:08 PM  [facility] [address] [place], [place], [address] Phone: [phone]   Fax:  [phone]  Name: DEARA BOBER MRN: [id number] Date of Birth: 04-01-1966

## 2018-02-23 ENCOUNTER — Other Ambulatory Visit: Payer: Self-pay

## 2018-02-23 ENCOUNTER — Ambulatory Visit (INDEPENDENT_AMBULATORY_CARE_PROVIDER_SITE_OTHER): Payer: Medicare Other | Admitting: Gastroenterology

## 2018-02-23 ENCOUNTER — Encounter: Payer: Self-pay | Admitting: Gastroenterology

## 2018-02-23 VITALS — BP 125/80 | HR 105 | Ht 66.0 in | Wt 227.0 lb

## 2018-02-23 DIAGNOSIS — D509 Iron deficiency anemia, unspecified: Secondary | ICD-10-CM

## 2018-02-23 DIAGNOSIS — D500 Iron deficiency anemia secondary to blood loss (chronic): Secondary | ICD-10-CM

## 2018-02-23 NOTE — H&P (View-Only) (Signed)
Gastroenterology Consultation  Referring Provider:     Weyman Rodney, MD Primary Care Physician:  Weyman Rodney, MD Primary Gastroenterologist:  Dr. Allen Norris     Reason for Consultation:     Iron deficiency anemia        HPI:   Christine Mcneil is a 52 y.o. y/o female referred for consultation & management of iron deficiency anemia by Dr. Sharene Skeans, Waunita Schooner, MD.  This patient comes in today with a history of having an EGD and colonoscopy in 2014 at [facility].  The patient also had an ERCP in 2002.  The patient had blood work last month that showed her to have normal iron studies with a high TIBC and a low ferritin at 7.  The patient has a history of PTSD, migraine headaches, anemia, chronic pain, depression and a history of a CVA.  The patient was seen by hematology back in August with repeat blood levels and a diagnosis of iron deficiency with microcytic anemia.  The patient denies any black stools or bloody stools.  She also reports that she recently started on iron infusion but that was only after she had her most recent blood work done.  The patient denies any abdominal pain nausea vomiting fevers or chills.  Past Medical History:  Diagnosis Date  . Abdominal pain   . Anxiety and depression   . Atypical facial pain   . Bilateral occipital neuralgia   . Cervical spondylosis without myelopathy   . Chronic daily headache   . Chronic pain   . Chronic prostatitis   . Chronic thoracic back pain   . Depression   . Diabetes mellitus without complication ([place])   . Diabetes mellitus, type II ([place])   . Dysphagia   . Fibromyalgia   . Hyperlipidemia   . Hypertension   . Insomnia   . Iron deficiency anemia   . Left hip pain   . Low back pain   . Migraines   . Numbness   . Optic neuropathy   . OSA (obstructive sleep apnea)   . Polyarthralgia   . Primary osteoarthritis of both hips   . Prothrombin gene mutation ([place])   . Pseudotumor cerebri   . Rectal bleeding   . Right  shoulder pain   . Rotator cuff syndrome   . Thyroid disease   . TIA (transient ischemic attack) 05/27/2017    Past Surgical History:  Procedure Laterality Date  . ABDOMINAL HYSTERECTOMY     total  . CHOLECYSTECTOMY    . KNEE SURGERY Left   . ROTATOR CUFF REPAIR Right     Prior to Admission medications   Medication Sig Start Date End Date Taking? Authorizing Provider  ACCU-CHEK SOFTCLIX LANCETS lancets  11/07/17   [provider]  acetaZOLAMIDE (DIAMOX) 125 MG tablet  11/25/17   [provider]  AIMOVIG 70 MG/ML SOAJ Inject 70 mg into the skin every 28 (twenty-eight) days. 06/15/17   [provider]  ARIPiprazole (ABILIFY) 5 MG tablet Take 1 tablet (5 mg total) by mouth daily. 01/13/18   Ursula Alert, MD  aspirin-acetaminophen-caffeine (EXCEDRIN MIGRAINE) [phone] MG tablet Take 2 tablets by mouth every 8 (eight) hours as needed for headache or migraine.     [provider]  atorvastatin (LIPITOR) 80 MG tablet Take 80 mg by mouth daily at 6 PM. 12/22/17   [provider]  Blood Glucose Monitoring Suppl (ACCU-CHEK AVIVA PLUS) w/Device KIT  11/07/17   [provider]  buPROPion (WELLBUTRIN XL) 150 MG 24 hr tablet Take 1 tablet (150 mg total) by mouth daily. 01/13/18   Ursula Alert, MD  Carboxymethylcellulose Sod PF (REFRESH PLUS) 0.5 % SOLN Apply to eye. 05/15/15   [provider]  Cholecalciferol (VITAMIN D3) 1000 units CAPS Take 1 capsule by mouth daily.    [provider]  clonazePAM Bobbye [place]) 0.5 MG tablet May use up to once daily as needed for facial pain 12/26/17   [provider]  clotrimazole (LOTRIMIN) 1 % cream  12/05/17   [provider]  cyclobenzaprine (FLEXERIL) 5 MG tablet  12/08/17   [provider]  dabigatran (PRADAXA) 150 MG CAPS capsule Take 150 mg by mouth 2 (two) times daily. 10/12/16 10/12/17  [provider]  dabigatran (PRADAXA) 150 MG CAPS capsule Take by mouth.  12/23/17   [provider]  diclofenac (FLECTOR) 1.3 % [facility]  12/15/17   [provider]  diltiazem (CARDIZEM CD) 240 MG 24 hr capsule Take by mouth. Take 1 capsule by mouth once daily 12/26/17 12/26/18  [provider]  DULoxetine (CYMBALTA) 30 MG capsule Take 3 capsules (90 mg total) by mouth daily. 01/13/18   Ursula Alert, MD  esomeprazole (NEXIUM) 40 MG capsule Take 40 mg by mouth daily.    [provider]  ferrous sulfate 325 (65 FE) MG tablet Take by mouth. Take 325 mg daily with breakfast    [provider]  fluticasone (FLONASE) 50 MCG/ACT nasal spray  06/25/17   [provider]  glipiZIDE (GLUCOTROL) 5 MG tablet Take 1 tablet (5 mg total) by mouth 2 (two) times daily before a meal. 06/22/17   Gouru, Aruna, MD  hydrOXYzine (ATARAX/VISTARIL) 50 MG tablet Take 1 tablet (50 mg total) by mouth 3 (three) times daily as needed for anxiety. 01/13/18   Ursula Alert, MD  ketoconazole (NIZORAL) 2 % cream 1 application. 12/13/12   [provider]  lamoTRIgine (LAMICTAL) 100 MG tablet To be combined with 25 mg 01/13/18   Ursula Alert, MD  lamoTRIgine (LAMICTAL) 25 MG tablet Take 1 tablet (25 mg total) by mouth daily. To be combined with 100 mg 01/13/18   Ursula Alert, MD  levothyroxine (SYNTHROID, LEVOTHROID) 100 MCG tablet Take 100 mcg by mouth daily. 08/18/15   [provider]  loratadine (CLARITIN) 10 MG tablet Take 10 mg by mouth daily as needed for allergies.    [provider]  MAGNESIUM PO Take 500 mg by mouth 2 (two) times daily.    [provider]  metFORMIN (GLUCOPHAGE) 500 MG tablet Take 500 mg by mouth 2 (two) times daily.    [provider]  metoCLOPramide (REGLAN) 10 MG tablet Take 1 tablet (10 mg total) by mouth every 8 (eight) hours as needed for up to 3 days for nausea. 06/05/17 06/08/17  Rudene Re, MD  NUCYNTA ER 100 MG 12 hr tablet Take 1 tablet by mouth 2 (two) times daily. 05/30/17    [provider]  ondansetron (ZOFRAN-ODT) 4 MG disintegrating tablet Take 4 mg by mouth every 8 (eight) hours as needed for nausea or vomiting.    [provider]  pioglitazone (ACTOS) 15 MG tablet Take by mouth. Take 1 tablet by mouth once daily 11/07/17 11/07/18  [provider]  promethazine (PHENERGAN) 25 MG tablet Take 25 mg by mouth every 8 (eight) hours as needed for nausea or vomiting.    [provider]  propranolol (INDERAL) 10 MG tablet Take  1 tablet by mouth 3 (three) times daily. 06/06/17   [provider]  quinapril (ACCUPRIL) 10 MG tablet Take 10 mg by mouth daily. 05/01/13   [provider]  ranitidine (ZANTAC) 150 MG tablet Take 150 mg by mouth 2 (two) times daily. 01/20/15   [provider]  rizatriptan (MAXALT) 10 MG tablet Take 1 tab at migraine onset. May repeat in 2 hours prn.  Limit to 2 in 24 hours and to 2 days weekly. 09/09/15   [provider]  sitaGLIPtin (JANUVIA) 100 MG tablet Take 100 mg by mouth daily.    [provider]  traZODone (DESYREL) 50 MG tablet Take 1 tablet (50 mg total) by mouth at bedtime as needed. pls do not combine with Ambien 02/13/18   Ursula Alert, MD  verapamil (CALAN-SR) 240 MG CR tablet Take 1 tablet by mouth daily. 05/26/17   [provider]  zolpidem (AMBIEN) 10 MG tablet Take 10 mg by mouth daily. 12/16/16 06/20/17  [provider]    Family History  Problem Relation Age of Onset  . Diabetes Mother   . Hyperlipidemia Mother   . Hypertension Mother   . COPD Mother   . CVA Mother   . Anemia Mother   . Diabetes Father   . Hyperlipidemia Father   . Hypertension Father   . Thyroid disease Father   . Alcohol abuse Father   . Peripheral vascular disease Sister   . Hypertension Sister   . Anxiety disorder Son   . Depression Son   . Bipolar disorder Son   . Seizures Son      Social History   Tobacco Use  . Smoking status: Never Smoker  .  Smokeless tobacco: Never Used  Substance Use Topics  . Alcohol use: No  . Drug use: No    Allergies as of 02/23/2018 - Review Complete 02/23/2018  Allergen Reaction Noted  . Amoxicillin Itching   . Amoxapine Itching 09/16/2015  . Dapagliflozin  11/07/2017  . Keflex [cephalexin] Itching 09/16/2015    Review of Systems:    All systems reviewed and negative except where noted in HPI.   Physical Exam:  BP 125/80   Pulse (!) 105   Ht 5' 6" (1.676 m)   Wt 227 lb (103 kg)   BMI 36.64 kg/m  No LMP recorded. Patient has had a hysterectomy. General:   Alert,  Well-developed, well-nourished, pleasant and cooperative in NAD Head:  Normocephalic and atraumatic. Eyes:  Sclera clear, no icterus.   Conjunctiva pink. Ears:  Normal auditory acuity. Nose:  No deformity, discharge, or lesions. Mouth:  No deformity or lesions,oropharynx pink & moist. Neck:  Supple; no masses or thyromegaly. Lungs:  Respirations even and unlabored.  Clear throughout to auscultation.   No wheezes, crackles, or rhonchi. No acute distress. Heart:  Regular rate and rhythm; no murmurs, clicks, rubs, or gallops. Abdomen:  Normal bowel sounds.  No bruits.  Soft, non-tender and non-distended without masses, hepatosplenomegaly or hernias noted.  No guarding or rebound tenderness.  Negative Carnett sign.   Rectal:  Deferred.  Msk:  Symmetrical without gross deformities.  Good, equal movement & strength bilaterally. Pulses:  Normal pulses noted. Extremities:  No clubbing or edema.  No cyanosis. Neurologic:  Alert and oriented x3;  grossly normal neurologically. Skin:  Intact without significant lesions or rashes.  No jaundice. Lymph Nodes:  No significant cervical adenopathy. Psych:  Alert and cooperative. Normal mood and affect.  Imaging Studies: No results  found.  Assessment and Plan:   Christine Mcneil is a 52 y.o. y/o female who comes in today with a history of iron deficiency anemia.  The patient denies any signs of  any blood loss.  The patient does have low ferritin.  The patient will be set up for an EGD and colonoscopy.  The patient has also been told that if her EGD and colonoscopy is negative she may need to be set up for a capsule endoscopy. I have discussed risks & benefits which include, but are not limited to, bleeding, infection, perforation & drug reaction.  The patient agrees with this plan & written consent will be obtained.     Lucilla Lame, MD. Marval Regal    Note: This dictation was prepared with Dragon dictation along with smaller phrase technology. Any transcriptional errors that result from this process are unintentional.

## 2018-02-23 NOTE — Progress Notes (Signed)
Gastroenterology Consultation  Referring Provider:     Weyman Rodney, MD Primary Care Physician:  Weyman Rodney, MD Primary Gastroenterologist:  Dr. Allen Norris     Reason for Consultation:     Iron deficiency anemia        HPI:   Christine Mcneil is a 52 y.o. y/o female referred for consultation & management of iron deficiency anemia by Dr. Sharene Skeans, Waunita Schooner, MD.  This patient comes in today with a history of having an EGD and colonoscopy in 2014 at [facility].  The patient also had an ERCP in 2002.  The patient had blood work last month that showed her to have normal iron studies with a high TIBC and a low ferritin at 7.  The patient has a history of PTSD, migraine headaches, anemia, chronic pain, depression and a history of a CVA.  The patient was seen by hematology back in August with repeat blood levels and a diagnosis of iron deficiency with microcytic anemia.  The patient denies any black stools or bloody stools.  She also reports that she recently started on iron infusion but that was only after she had her most recent blood work done.  The patient denies any abdominal pain nausea vomiting fevers or chills.  Past Medical History:  Diagnosis Date  . Abdominal pain   . Anxiety and depression   . Atypical facial pain   . Bilateral occipital neuralgia   . Cervical spondylosis without myelopathy   . Chronic daily headache   . Chronic pain   . Chronic prostatitis   . Chronic thoracic back pain   . Depression   . Diabetes mellitus without complication ([place])   . Diabetes mellitus, type II ([place])   . Dysphagia   . Fibromyalgia   . Hyperlipidemia   . Hypertension   . Insomnia   . Iron deficiency anemia   . Left hip pain   . Low back pain   . Migraines   . Numbness   . Optic neuropathy   . OSA (obstructive sleep apnea)   . Polyarthralgia   . Primary osteoarthritis of both hips   . Prothrombin gene mutation ([place])   . Pseudotumor cerebri   . Rectal bleeding   . Right  shoulder pain   . Rotator cuff syndrome   . Thyroid disease   . TIA (transient ischemic attack) 05/27/2017    Past Surgical History:  Procedure Laterality Date  . ABDOMINAL HYSTERECTOMY     total  . CHOLECYSTECTOMY    . KNEE SURGERY Left   . ROTATOR CUFF REPAIR Right     Prior to Admission medications   Medication Sig Start Date End Date Taking? Authorizing Provider  ACCU-CHEK SOFTCLIX LANCETS lancets  11/07/17   [provider]  acetaZOLAMIDE (DIAMOX) 125 MG tablet  11/25/17   [provider]  AIMOVIG 70 MG/ML SOAJ Inject 70 mg into the skin every 28 (twenty-eight) days. 06/15/17   [provider]  ARIPiprazole (ABILIFY) 5 MG tablet Take 1 tablet (5 mg total) by mouth daily. 01/13/18   Ursula Alert, MD  aspirin-acetaminophen-caffeine (EXCEDRIN MIGRAINE) [phone] MG tablet Take 2 tablets by mouth every 8 (eight) hours as needed for headache or migraine.     [provider]  atorvastatin (LIPITOR) 80 MG tablet Take 80 mg by mouth daily at 6 PM. 12/22/17   [provider]  Blood Glucose Monitoring Suppl (ACCU-CHEK AVIVA PLUS) w/Device KIT  11/07/17   [provider]  buPROPion (WELLBUTRIN XL) 150 MG 24 hr tablet Take 1 tablet (150 mg total) by mouth daily. 01/13/18   Ursula Alert, MD  Carboxymethylcellulose Sod PF (REFRESH PLUS) 0.5 % SOLN Apply to eye. 05/15/15   [provider]  Cholecalciferol (VITAMIN D3) 1000 units CAPS Take 1 capsule by mouth daily.    [provider]  clonazePAM Bobbye [place]) 0.5 MG tablet May use up to once daily as needed for facial pain 12/26/17   [provider]  clotrimazole (LOTRIMIN) 1 % cream  12/05/17   [provider]  cyclobenzaprine (FLEXERIL) 5 MG tablet  12/08/17   [provider]  dabigatran (PRADAXA) 150 MG CAPS capsule Take 150 mg by mouth 2 (two) times daily. 10/12/16 10/12/17  [provider]  dabigatran (PRADAXA) 150 MG CAPS capsule Take by mouth.  12/23/17   [provider]  diclofenac (FLECTOR) 1.3 % [facility]  12/15/17   [provider]  diltiazem (CARDIZEM CD) 240 MG 24 hr capsule Take by mouth. Take 1 capsule by mouth once daily 12/26/17 12/26/18  [provider]  DULoxetine (CYMBALTA) 30 MG capsule Take 3 capsules (90 mg total) by mouth daily. 01/13/18   Ursula Alert, MD  esomeprazole (NEXIUM) 40 MG capsule Take 40 mg by mouth daily.    [provider]  ferrous sulfate 325 (65 FE) MG tablet Take by mouth. Take 325 mg daily with breakfast    [provider]  fluticasone (FLONASE) 50 MCG/ACT nasal spray  06/25/17   [provider]  glipiZIDE (GLUCOTROL) 5 MG tablet Take 1 tablet (5 mg total) by mouth 2 (two) times daily before a meal. 06/22/17   Gouru, Aruna, MD  hydrOXYzine (ATARAX/VISTARIL) 50 MG tablet Take 1 tablet (50 mg total) by mouth 3 (three) times daily as needed for anxiety. 01/13/18   Ursula Alert, MD  ketoconazole (NIZORAL) 2 % cream 1 application. 12/13/12   [provider]  lamoTRIgine (LAMICTAL) 100 MG tablet To be combined with 25 mg 01/13/18   Ursula Alert, MD  lamoTRIgine (LAMICTAL) 25 MG tablet Take 1 tablet (25 mg total) by mouth daily. To be combined with 100 mg 01/13/18   Ursula Alert, MD  levothyroxine (SYNTHROID, LEVOTHROID) 100 MCG tablet Take 100 mcg by mouth daily. 08/18/15   [provider]  loratadine (CLARITIN) 10 MG tablet Take 10 mg by mouth daily as needed for allergies.    [provider]  MAGNESIUM PO Take 500 mg by mouth 2 (two) times daily.    [provider]  metFORMIN (GLUCOPHAGE) 500 MG tablet Take 500 mg by mouth 2 (two) times daily.    [provider]  metoCLOPramide (REGLAN) 10 MG tablet Take 1 tablet (10 mg total) by mouth every 8 (eight) hours as needed for up to 3 days for nausea. 06/05/17 06/08/17  Rudene Re, MD  NUCYNTA ER 100 MG 12 hr tablet Take 1 tablet by mouth 2 (two) times daily. 05/30/17    [provider]  ondansetron (ZOFRAN-ODT) 4 MG disintegrating tablet Take 4 mg by mouth every 8 (eight) hours as needed for nausea or vomiting.    [provider]  pioglitazone (ACTOS) 15 MG tablet Take by mouth. Take 1 tablet by mouth once daily 11/07/17 11/07/18  [provider]  promethazine (PHENERGAN) 25 MG tablet Take 25 mg by mouth every 8 (eight) hours as needed for nausea or vomiting.    [provider]  propranolol (INDERAL) 10 MG tablet Take  1 tablet by mouth 3 (three) times daily. 06/06/17   [provider]  quinapril (ACCUPRIL) 10 MG tablet Take 10 mg by mouth daily. 05/01/13   [provider]  ranitidine (ZANTAC) 150 MG tablet Take 150 mg by mouth 2 (two) times daily. 01/20/15   [provider]  rizatriptan (MAXALT) 10 MG tablet Take 1 tab at migraine onset. May repeat in 2 hours prn.  Limit to 2 in 24 hours and to 2 days weekly. 09/09/15   [provider]  sitaGLIPtin (JANUVIA) 100 MG tablet Take 100 mg by mouth daily.    [provider]  traZODone (DESYREL) 50 MG tablet Take 1 tablet (50 mg total) by mouth at bedtime as needed. pls do not combine with Ambien 02/13/18   Eappen, Saramma, MD  verapamil (CALAN-SR) 240 MG CR tablet Take 1 tablet by mouth daily. 05/26/17   [provider]  zolpidem (AMBIEN) 10 MG tablet Take 10 mg by mouth daily. 12/16/16 06/20/17  [provider]    Family History  Problem Relation Age of Onset  . Diabetes Mother   . Hyperlipidemia Mother   . Hypertension Mother   . COPD Mother   . CVA Mother   . Anemia Mother   . Diabetes Father   . Hyperlipidemia Father   . Hypertension Father   . Thyroid disease Father   . Alcohol abuse Father   . Peripheral vascular disease Sister   . Hypertension Sister   . Anxiety disorder Son   . Depression Son   . Bipolar disorder Son   . Seizures Son      Social History   Tobacco Use  . Smoking status: Never Smoker  .  Smokeless tobacco: Never Used  Substance Use Topics  . Alcohol use: No  . Drug use: No    Allergies as of 02/23/2018 - Review Complete 02/23/2018  Allergen Reaction Noted  . Amoxicillin Itching   . Amoxapine Itching 09/16/2015  . Dapagliflozin  11/07/2017  . Keflex [cephalexin] Itching 09/16/2015    Review of Systems:    All systems reviewed and negative except where noted in HPI.   Physical Exam:  BP 125/80   Pulse (!) 105   Ht 5' 6" (1.676 m)   Wt 227 lb (103 kg)   BMI 36.64 kg/m  No LMP recorded. Patient has had a hysterectomy. General:   Alert,  Well-developed, well-nourished, pleasant and cooperative in NAD Head:  Normocephalic and atraumatic. Eyes:  Sclera clear, no icterus.   Conjunctiva pink. Ears:  Normal auditory acuity. Nose:  No deformity, discharge, or lesions. Mouth:  No deformity or lesions,oropharynx pink & moist. Neck:  Supple; no masses or thyromegaly. Lungs:  Respirations even and unlabored.  Clear throughout to auscultation.   No wheezes, crackles, or rhonchi. No acute distress. Heart:  Regular rate and rhythm; no murmurs, clicks, rubs, or gallops. Abdomen:  Normal bowel sounds.  No bruits.  Soft, non-tender and non-distended without masses, hepatosplenomegaly or hernias noted.  No guarding or rebound tenderness.  Negative Carnett sign.   Rectal:  Deferred.  Msk:  Symmetrical without gross deformities.  Good, equal movement & strength bilaterally. Pulses:  Normal pulses noted. Extremities:  No clubbing or edema.  No cyanosis. Neurologic:  Alert and oriented x3;  grossly normal neurologically. Skin:  Intact without significant lesions or rashes.  No jaundice. Lymph Nodes:  No significant cervical adenopathy. Psych:  Alert and cooperative. Normal mood and affect.  Imaging Studies: No results   found.  Assessment and Plan:   Christine Mcneil is a 52 y.o. y/o female who comes in today with a history of iron deficiency anemia.  The patient denies any signs of  any blood loss.  The patient does have low ferritin.  The patient will be set up for an EGD and colonoscopy.  The patient has also been told that if her EGD and colonoscopy is negative she may need to be set up for a capsule endoscopy. I have discussed risks & benefits which include, but are not limited to, bleeding, infection, perforation & drug reaction.  The patient agrees with this plan & written consent will be obtained.     Lucilla Lame, MD. Marval Regal    Note: This dictation was prepared with Dragon dictation along with smaller phrase technology. Any transcriptional errors that result from this process are unintentional.

## 2018-03-03 ENCOUNTER — Telehealth: Payer: Self-pay

## 2018-03-03 NOTE — Telephone Encounter (Signed)
Left vm for pt to return my call with regards to stopping blood thinner prior to colonoscopy on 03/21/18.

## 2018-03-06 ENCOUNTER — Encounter: Payer: Self-pay | Admitting: Physical Therapy

## 2018-03-06 ENCOUNTER — Ambulatory Visit: Payer: Medicare Other | Attending: Neurology | Admitting: Physical Therapy

## 2018-03-06 DIAGNOSIS — M6281 Muscle weakness (generalized): Secondary | ICD-10-CM | POA: Diagnosis present

## 2018-03-06 DIAGNOSIS — R296 Repeated falls: Secondary | ICD-10-CM | POA: Diagnosis present

## 2018-03-06 DIAGNOSIS — R269 Unspecified abnormalities of gait and mobility: Secondary | ICD-10-CM | POA: Insufficient documentation

## 2018-03-06 NOTE — Therapy (Signed)
[facility] [address]. [place], [place], [address] Phone: [phone]   Fax:  [phone]  Physical Therapy Treatment  Patient Details  Name: Christine Mcneil MRN: [id number] Date of Birth: March 27, 1966 Referring Provider (PT): Dr. Jennings Books   Encounter Date: 03/06/2018  PT End of Session - 03/06/18 1556    Visit Number  18    Number of Visits  21    Date for PT Re-Evaluation  04/17/18    PT Start Time  1342    PT Stop Time  1434    PT Time Calculation (min)  52 min    Equipment Utilized During Treatment  Gait belt    Activity Tolerance  Patient tolerated treatment well    Behavior During Therapy  [facility] for tasks assessed/performed       Past Medical History:  Diagnosis Date  . Abdominal pain   . Anxiety and depression   . Atypical facial pain   . Bilateral occipital neuralgia   . Cervical spondylosis without myelopathy   . Chronic daily headache   . Chronic pain   . Chronic prostatitis   . Chronic thoracic back pain   . Depression   . Diabetes mellitus without complication ([place])   . Diabetes mellitus, type II ([place])   . Dysphagia   . Fibromyalgia   . Hyperlipidemia   . Hypertension   . Insomnia   . Iron deficiency anemia   . Left hip pain   . Low back pain   . Migraines   . Numbness   . Optic neuropathy   . OSA (obstructive sleep apnea)   . Polyarthralgia   . Primary osteoarthritis of both hips   . 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 Pseudotumor cerebri   . Rectal bleeding   . Right shoulder pain   . Rotator cuff syndrome   . Thyroid disease   . TIA (transient ischemic attack) 05/27/2017    Past Surgical History:  Procedure Laterality Date  . ABDOMINAL HYSTERECTOMY     total  . CHOLECYSTECTOMY    . KNEE SURGERY Left   . ROTATOR CUFF REPAIR Right     There were no vitals filed for this visit.  Subjective Assessment - 03/06/18 1348    Subjective  Pt. reports that she has been active, however has not  been able to attend the gym yet.  Pt. notes that she is going to stop by the gym today in order to sign up for classes and check on the pool.    Pertinent History  Significant PMHx    Limitations  Standing;Walking;House hold activities    Patient Stated Goals  Increase LE strength/ balance/ independence with walking    Currently in Pain?  Yes    Pain Score  4     Pain Location  Hip    Pain Orientation  Right    Pain Descriptors / Indicators  Aching    Pain Type  Chronic pain    Pain Onset  More than a month ago         TREATMENT  There Ex:  Seated Glute Sets on Therapy Ball (5 sec hold) x15 Seated Deadbugs on Therapy Ball x20 Standing B Lunges in // bars x15 each Standing B Hip Hikes on 6" Step x15 each Standing B 6" Step-Ups with Contralateral Hip Flexion x15 each Standing B Lateral Step Over 3" Steps x10 each Resisted Walking Forward/Backward 60# x10 each  PT Long Term Goals - 02/20/18 1753      PT LONG TERM GOAL #1   Title  Pt. will be independent with HEP to increase B LE muscle strength 1/2 muscle grade to improve standing tolerance/ gait.      Baseline  R/L Hip Flex: 4/4+; R/L Knee Ext: 5/5;     Time  4    Period  Weeks    Status  Achieved    Target Date  11/22/17      PT LONG TERM GOAL #2   Title  Pt. will increase FOTO from 32 to 40 to improve functional mobility.     Baseline  FOTO: 40 12/06/17    Time  4    Period  Weeks    Status  Achieved    Target Date  11/10/17      PT LONG TERM GOAL #3   Title  Pt. will increase Berg balance score to >51/56 to decrease fall risk/ improve safety with gait.      Baseline  Berg 55/56 on 6/13    Time  4    Period  Weeks    Status  Achieved    Target Date  11/03/17      PT LONG TERM GOAL #4   Title  Pt. able to ambulate outside on grassy terrain with no LOB to promote independence with gait.      Baseline  increase fall risk on grass/ falls reported while gardening    Time  4    Period  Weeks    Status   Achieved    Target Date  11/22/17      PT LONG TERM GOAL #5   Title  Pt. will be able to perform SLS >30sec in B LE with no LOB to ensure ankle stability needed for ascending/descending stairs.    Baseline  Pt. able to hold SLS <5 sec without LOB.    Time  4    Period  Weeks    Status  Achieved    Target Date  12/06/17      PT LONG TERM GOAL #6   Title  Pt. will improve FOTO to 48 to signify an increase in pain free functional mobility.    Baseline  FOTO: 40 on 12/06/17; FOTO: 44 on 01/10/18    Time  4    Period  Weeks    Status  Unable to assess    Target Date  04/17/18      PT LONG TERM GOAL #7   Title  Pt. will report no falls in the next 4 weeks to demonstrate increased balance/ coordination with movement patterns.    Baseline  Reports a fall occurred 7/16; Pt. reported a fall on 9/14    Time  4    Period  Weeks    Status  Achieved    Target Date  02/07/18      PT LONG TERM GOAL #8   Title  Pt. will tolerate standing no increase in pain for >30 minutes in order to cook in kitchen.    Baseline  Unable to stand for 10 minutes without increase in pain of hip.; 9/30 Pt. reports that she is able to stand for 5 minutes without pain.    Time  4    Period  Weeks    Status  Partially Met    Target Date  04/17/18         Plan - 03/06/18 1557  Clinical Impression Statement  Pt. continues to perform well with exercises and is approaching more gym-based exercises as she progresses.  Pt. continues to report no falls and is feeling more stable on feet when ambulating outside and across uneven terrain.  Pt. has consistently made improvements towards goals which will be reassessed for likely d/c at next visit.  Pt. educated on importance of continuing active lifestyle for overall health and well-being.    Clinical Presentation  Evolving    Clinical Decision Making  Low    Rehab Potential  Fair    PT Frequency  Biweekly    PT Duration  8 weeks    PT Treatment/Interventions  ADLs/Self  Care Home Management;Aquatic Therapy;Gait training;Moist Heat;Cryotherapy;Stair training;Functional mobility training;Therapeutic activities;Balance training;Therapeutic exercise;Patient/family education;Neuromuscular re-education;Manual techniques;Passive range of motion    PT Next Visit Plan  d/c at next visit.    PT Home Exercise Plan  see HEP    Consulted and Agree with Plan of Care  Patient       Patient will benefit from skilled therapeutic intervention in order to improve the following deficits and impairments:  Abnormal gait, Improper body mechanics, Pain, Postural dysfunction, Decreased coordination, Decreased activity tolerance, Decreased endurance, Decreased range of motion, Decreased strength, Obesity, Impaired flexibility, Difficulty walking, Decreased safety awareness, Decreased balance  Visit Diagnosis: Gait difficulty  Repeated falls  Muscle weakness (generalized)     Problem List Patient Active Problem List   Diagnosis Date Noted  . Iron deficiency anemia 01/19/2018  . 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 TIA (transient ischemic attack) 06/20/2017  . Primary osteoarthritis of both hips 06/20/2017  . Periodic limb movements of sleep 05/30/2017  . Shortness of breath 01/03/2017  . 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 Chronic prostatitis/chronic pelvic pain syndrome 05/28/2016  . Migraine without aura and without status migrainosus, not intractable 05/11/2016  . Chronic anticoagulation 03/29/2016  . Encounter for monitoring opioid maintenance therapy 11/04/2015  . Dysphagia, neurologic 09/16/2015  . Numbness 09/16/2015  . Anti-cardiolipin antibody positive 09/01/2015  . 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 History of cerebral venous sinus thrombosis 06/27/2015  . Anemia, iron deficiency 10/02/2014  . Chronic thoracic back pain 07/24/2014  . Bilateral occipital neuralgia 04/11/2014  . Hypothyroidism, unspecified  10/17/2013  . Type 2 diabetes, HbA1c goal < 7% (HCC) 10/17/2013  . Cervicogenic headache 07/04/2013  . Cervical spondylosis without myelopathy 11/16/2012  . Rotator cuff syndrome 11/13/2012  . Sinus congestion 11/07/2012  . Abdominal pain 09/28/2012  . Rectal bleeding 09/28/2012  . Depression 02/22/2012  . GERD (gastroesophageal reflux disease) 02/22/2012  . Essential hypertension 02/22/2012  . Obesity, unspecified 02/22/2012  . Neuromuscular disorder ([place]) 02/22/2012  . Pain in joint, pelvic region and thigh 01/17/2012  . Insomnia secondary to chronic pain 10/04/2011  . Right shoulder pain 10/04/2011  . Atypical facial pain 10/03/2011  . Chronic daily headache 10/03/2011  . Fibromyalgia 10/03/2011  . Hyperlipidemia, unspecified 10/03/2011   Pura Spice, PT, DPT # [id number] Gwenlyn Saran, SPT 03/06/2018, 6:47 PM  [facility] [address] [place], [place], [address] Phone: [phone]   Fax:  [phone]  Name: Christine Mcneil MRN: [id number] Date of Birth: 1965/10/17

## 2018-03-06 NOTE — Patient Instructions (Signed)
Access Code: [email or web address]  URL: [email or web address]  Date: 03/06/2018  Prepared by: Carmel Nation   Exercises  Supine Bridge - 10 reps - 3 sets - 1x daily - 7x weekly  Standard Lunge - 10 reps - 3 sets - 1x daily - 7x weekly  Step Up - 10 reps - 3 sets - 1x daily - 7x weekly  Lateral Step Up - 10 reps - 3 sets - 1x daily - 7x weekly  Hip Hiking on Step - 10 reps - 3 sets - 1x daily - 7x weekly

## 2018-03-07 ENCOUNTER — Inpatient Hospital Stay: Payer: Medicare Other | Attending: Oncology

## 2018-03-07 DIAGNOSIS — D509 Iron deficiency anemia, unspecified: Secondary | ICD-10-CM

## 2018-03-07 DIAGNOSIS — R778 Other specified abnormalities of plasma proteins: Secondary | ICD-10-CM | POA: Diagnosis not present

## 2018-03-07 DIAGNOSIS — I10 Essential (primary) hypertension: Secondary | ICD-10-CM | POA: Diagnosis not present

## 2018-03-07 DIAGNOSIS — E119 Type 2 diabetes mellitus without complications: Secondary | ICD-10-CM | POA: Insufficient documentation

## 2018-03-07 LAB — CBC WITH DIFFERENTIAL/PLATELET
Abs Immature Granulocytes: 0.01 10*3/uL (ref 0.00–0.07)
BASOS PCT: 1 %
Basophils Absolute: 0 10*3/uL (ref 0.0–0.1)
EOS ABS: 0.1 10*3/uL (ref 0.0–0.5)
Eosinophils Relative: 2 %
HCT: 41.8 % (ref 36.0–46.0)
Hemoglobin: 13.1 g/dL (ref 12.0–15.0)
Immature Granulocytes: 0 %
Lymphocytes Relative: 27 %
Lymphs Abs: 1.8 10*3/uL (ref 0.7–4.0)
MCH: 26.3 pg (ref 26.0–34.0)
MCHC: 31.3 g/dL (ref 30.0–36.0)
MCV: 83.8 fL (ref 80.0–100.0)
MONO ABS: 0.5 10*3/uL (ref 0.1–1.0)
MONOS PCT: 7 %
Neutro Abs: 4.4 10*3/uL (ref 1.7–7.7)
Neutrophils Relative %: 63 %
PLATELETS: 229 10*3/uL (ref 150–400)
RBC: 4.99 MIL/uL (ref 3.87–5.11)
RDW: 19.8 % — AB (ref 11.5–15.5)
WBC: 6.9 10*3/uL (ref 4.0–10.5)
nRBC: 0 % (ref 0.0–0.2)

## 2018-03-07 LAB — FERRITIN: FERRITIN: 48 ng/mL (ref 11–307)

## 2018-03-07 LAB — IRON AND TIBC
IRON: 53 ug/dL (ref 28–170)
Saturation Ratios: 13 % (ref 10.4–31.8)
TIBC: 415 ug/dL (ref 250–450)
UIBC: 362 ug/dL

## 2018-03-08 ENCOUNTER — Ambulatory Visit: Payer: Medicare Other | Admitting: Psychiatry

## 2018-03-09 ENCOUNTER — Inpatient Hospital Stay (HOSPITAL_BASED_OUTPATIENT_CLINIC_OR_DEPARTMENT_OTHER): Payer: Medicare Other | Admitting: Oncology

## 2018-03-09 ENCOUNTER — Inpatient Hospital Stay: Payer: Medicare Other

## 2018-03-09 ENCOUNTER — Other Ambulatory Visit: Payer: Medicare Other

## 2018-03-09 ENCOUNTER — Other Ambulatory Visit: Payer: Self-pay

## 2018-03-09 ENCOUNTER — Encounter: Payer: Self-pay | Admitting: Oncology

## 2018-03-09 VITALS — BP 113/79 | HR 103 | Temp 96.3°F | Resp 18 | Wt 229.9 lb

## 2018-03-09 DIAGNOSIS — R778 Other specified abnormalities of plasma proteins: Secondary | ICD-10-CM | POA: Diagnosis not present

## 2018-03-09 DIAGNOSIS — D509 Iron deficiency anemia, unspecified: Secondary | ICD-10-CM | POA: Diagnosis not present

## 2018-03-09 DIAGNOSIS — E119 Type 2 diabetes mellitus without complications: Secondary | ICD-10-CM

## 2018-03-09 DIAGNOSIS — I10 Essential (primary) hypertension: Secondary | ICD-10-CM

## 2018-03-09 NOTE — Progress Notes (Signed)
Hematology/Oncology follow up note [facility] Telephone:([phone] Fax:([phone]) [phone]   Patient Care Team: Weyman Rodney, MD as PCP - General (Family Medicine)  REFERRING PROVIDER: Weyman Rodney, MD REASON FOR VISIT Follow up for treatment of IDA  HISTORY OF PRESENTING ILLNESS:  Christine Mcneil is a  52 y.o.  female with PMH listed below who was referred to me for evaluation of iron deficiency anemia.  History of iron deficiency: chronic iron deficiency, take oral iron supplementation.   Rectal bleeding: denies, however per patient recent FOBT was positive. She has been referred to see GI Menstrual bleeding/ Vaginal bleeding : denies Hematemesis or hemoptysis : denies Blood in urine : denies  Last endoscopy: NA Fatigue: reports worsening fatigue. Chronic onset, perisistent, no aggravating or improving factors, no associated symptoms.   Pertinent Hemonc histroy Per her medical records, she has history of prothrombin gene mutation, previous cerebral vein thrombosis and TIA, on long term anticoagulation with Pradaxa.    INTERVAL HISTORY Christine Mcneil is a 52 y.o. female who has above history reviewed by me today presents for follow up visit for management of  Problems and complaints are listed below: During the interval, she received IV Venofer infusion.  Reports fatigue is better after the iron infusion. Today she feels that she may had caught some virus infection as she is having chest congestion, mild nausea and dry cough.  Denies any fever or chills.  No nausea vomiting.  Review of Systems  Constitutional: Negative for chills, fever, malaise/fatigue and weight loss.  HENT: Negative for nosebleeds and sore throat.   Eyes: Negative for double vision, photophobia and redness.  Respiratory: Positive for cough. Negative for shortness of breath and wheezing.   Cardiovascular: Negative for chest pain, palpitations and orthopnea.  Gastrointestinal: Positive  for blood in stool and nausea. Negative for abdominal pain and vomiting.  Genitourinary: Negative for dysuria.  Musculoskeletal:       Chronic pain  Skin: Negative for itching and rash.  Neurological: Negative for dizziness, tingling and tremors.  Endo/Heme/Allergies: Negative for environmental allergies. Does not bruise/bleed easily.  Psychiatric/Behavioral: Negative for depression.    MEDICAL HISTORY:  Past Medical History:  Diagnosis Date  . Abdominal pain   . Anxiety and depression   . Atypical facial pain   . Bilateral occipital neuralgia   . Cervical spondylosis without myelopathy   . Chronic daily headache   . Chronic pain   . Chronic prostatitis   . Chronic thoracic back pain   . Depression   . Diabetes mellitus without complication ([place])   . Diabetes mellitus, type II ([place])   . Dysphagia   . Fibromyalgia   . Hyperlipidemia   . Hypertension   . Insomnia   . Iron deficiency anemia   . Left hip pain   . Low back pain   . Migraines   . Numbness   . Optic neuropathy   . OSA (obstructive sleep apnea)   . Polyarthralgia   . Primary osteoarthritis of both hips   . 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 Pseudotumor cerebri   . Rectal bleeding   . Right shoulder pain   . Rotator cuff syndrome   . Thyroid disease   . TIA (transient ischemic attack) 05/27/2017    SURGICAL HISTORY: Past Surgical History:  Procedure Laterality Date  . ABDOMINAL HYSTERECTOMY     total  . CHOLECYSTECTOMY    . KNEE SURGERY Left   . ROTATOR CUFF  REPAIR Right     SOCIAL HISTORY: Social History   Socioeconomic History  . Marital status: Divorced    Spouse name: Not on file  . Number of children: 1  . Years of education: Not on file  . Highest education level: High school graduate  Occupational History    Comment: disablitiy  Social Needs  . Financial resource strain: Not hard at all  . Food insecurity:    Worry: Never true    Inability: Never true  . Transportation needs:      Medical: No    Non-medical: No  Tobacco Use  . Smoking status: Never Smoker  . Smokeless tobacco: Never Used  Substance and Sexual Activity  . Alcohol use: No  . Drug use: No  . Sexual activity: Not Currently  Lifestyle  . Physical activity:    Days per week: 0 days    Minutes per session: 0 min  . Stress: Very much  Relationships  . Social connections:    Talks on phone: Once a week    Gets together: Never    Attends religious service: More than 4 times per year    Active member of club or organization: No    Attends meetings of clubs or organizations: Never    Relationship status: Divorced  . Intimate partner violence:    Fear of current or ex partner: No    Emotionally abused: No    Physically abused: No    Forced sexual activity: No  Other Topics Concern  . Not on file  Social History Narrative  . Not on file    FAMILY HISTORY: Family History  Problem Relation Age of Onset  . Diabetes Mother   . Hyperlipidemia Mother   . Hypertension Mother   . COPD Mother   . CVA Mother   . Anemia Mother   . Diabetes Father   . Hyperlipidemia Father   . Hypertension Father   . Thyroid disease Father   . Alcohol abuse Father   . Peripheral vascular disease Sister   . Hypertension Sister   . Anxiety disorder Son   . Depression Son   . Bipolar disorder Son   . Seizures Son     ALLERGIES:  is allergic to amoxicillin; amoxapine; dapagliflozin; and keflex [cephalexin].  MEDICATIONS:  Current Outpatient Medications  Medication Sig Dispense Refill  . ACCU-CHEK SOFTCLIX LANCETS lancets     . acetaZOLAMIDE (DIAMOX) 125 MG tablet     . AIMOVIG 70 MG/ML SOAJ Inject 70 mg into the skin every 28 (twenty-eight) days.    . ARIPiprazole (ABILIFY) 5 MG tablet Take 1 tablet (5 mg total) by mouth daily. 90 tablet 0  . aspirin-acetaminophen-caffeine (EXCEDRIN MIGRAINE) 250-250-65 MG tablet Take 2 tablets by mouth every 8 (eight) hours as needed for headache or migraine.     Marland Kitchen  atorvastatin (LIPITOR) 80 MG tablet Take 80 mg by mouth daily at 6 PM.    . Blood Glucose Monitoring Suppl (ACCU-CHEK AVIVA PLUS) w/Device KIT     . buPROPion (WELLBUTRIN XL) 150 MG 24 hr tablet Take 1 tablet (150 mg total) by mouth daily. 90 tablet 0  . Carboxymethylcellulose Sod PF (REFRESH PLUS) 0.5 % SOLN Apply to eye.    . Cholecalciferol (VITAMIN D3) 1000 units CAPS Take 1 capsule by mouth daily.    . clonazePAM (KLONOPIN) 0.5 MG tablet May use up to once daily as needed for facial pain    . clotrimazole (LOTRIMIN) 1 % cream     .  cyclobenzaprine (FLEXERIL) 5 MG tablet     . dabigatran (PRADAXA) 150 MG CAPS capsule Take by mouth.    . diclofenac (FLECTOR) 1.3 % PTCH     . diltiazem (CARDIZEM CD) 240 MG 24 hr capsule Take by mouth. Take 1 capsule by mouth once daily    . DULoxetine (CYMBALTA) 30 MG capsule Take 3 capsules (90 mg total) by mouth daily. (Patient taking differently: Take 30 mg by mouth daily. ) 270 capsule 0  . esomeprazole (NEXIUM) 40 MG capsule Take 40 mg by mouth daily.    . ferrous sulfate 325 (65 FE) MG tablet Take by mouth. Take 325 mg daily with breakfast    . fluticasone (FLONASE) 50 MCG/ACT nasal spray     . hydrOXYzine (ATARAX/VISTARIL) 50 MG tablet Take 1 tablet (50 mg total) by mouth 3 (three) times daily as needed for anxiety. 270 tablet 0  . ketoconazole (NIZORAL) 2 % cream 1 application.    Marland Kitchen lamoTRIgine (LAMICTAL) 100 MG tablet To be combined with 25 mg 90 tablet 0  . lamoTRIgine (LAMICTAL) 25 MG tablet Take 1 tablet (25 mg total) by mouth daily. To be combined with 100 mg 90 tablet 0  . levothyroxine (SYNTHROID, LEVOTHROID) 100 MCG tablet Take 100 mcg by mouth daily.    Marland Kitchen loratadine (CLARITIN) 10 MG tablet Take 10 mg by mouth daily as needed for allergies.    Marland Kitchen MAGNESIUM PO Take 500 mg by mouth 2 (two) times daily.    . metFORMIN (GLUCOPHAGE-XR) 500 MG 24 hr tablet Take 500 mg by mouth 2 (two) times daily.     Gean [place] ER 100 MG 12 hr tablet Take 1 tablet  by mouth 2 (two) times daily.    . ondansetron (ZOFRAN-ODT) 4 MG disintegrating tablet Take 4 mg by mouth every 8 (eight) hours as needed for nausea or vomiting.    . pioglitazone (ACTOS) 15 MG tablet Take by mouth. Take 1 tablet by mouth once daily    . promethazine (PHENERGAN) 25 MG tablet Take 25 mg by mouth every 8 (eight) hours as needed for nausea or vomiting.    . propranolol (INDERAL) 10 MG tablet Take 1 tablet by mouth 3 (three) times daily.    . quinapril (ACCUPRIL) 10 MG tablet Take 10 mg by mouth daily.    . ranitidine (ZANTAC) 150 MG tablet Take 150 mg by mouth 2 (two) times daily.    . rizatriptan (MAXALT) 10 MG tablet Take 1 tab at migraine onset. May repeat in 2 hours prn.  Limit to 2 in 24 hours and to 2 days weekly.    . sitaGLIPtin (JANUVIA) 100 MG tablet Take 100 mg by mouth daily.    . traZODone (DESYREL) 50 MG tablet Take 1 tablet (50 mg total) by mouth at bedtime as needed. pls do not combine with Ambien 90 tablet 0  . dabigatran (PRADAXA) 150 MG CAPS capsule Take 150 mg by mouth 2 (two) times daily.    . DULoxetine (CYMBALTA) 60 MG capsule     . glipiZIDE (GLUCOTROL) 5 MG tablet Take 1 tablet (5 mg total) by mouth 2 (two) times daily before a meal. (Patient not taking: Reported on 02/23/2018) 60 tablet 0  . metFORMIN (GLUCOPHAGE) 500 MG tablet Take 500 mg by mouth 2 (two) times daily.    . metoCLOPramide (REGLAN) 10 MG tablet Take 1 tablet (10 mg total) by mouth every 8 (eight) hours as needed for up to 3 days for nausea. [place]  tablet 0  . verapamil (CALAN-SR) 240 MG CR tablet Take 1 tablet by mouth daily.    Marland Kitchen zolpidem (AMBIEN) 10 MG tablet Take 10 mg by mouth daily.     No current facility-administered medications for this visit.      PHYSICAL EXAMINATION: ECOG PERFORMANCE STATUS: 1 - Symptomatic but completely ambulatory Vitals:   03/09/18 0959  BP: 113/79  Pulse: (!) 103  Resp: 18  Temp: (!) 96.3 F (35.7 C)   Filed Weights   03/09/18 0959  Weight: 229 lb 15  oz (104.3 kg)    Physical Exam  Constitutional: She is oriented to person, place, and time. No distress.  Morbidly obese  HENT:  Head: Normocephalic and atraumatic.  Mouth/Throat: Oropharynx is clear and moist.  Eyes: Pupils are equal, round, and reactive to light. EOM are normal. No scleral icterus.  Neck: Normal range of motion. Neck supple.  Cardiovascular: Normal rate, regular rhythm and normal heart sounds.  Pulmonary/Chest: Effort normal. No respiratory distress. She has no wheezes.  Abdominal: Soft. Bowel sounds are normal. She exhibits no distension and no mass. There is no tenderness.  Musculoskeletal: Normal range of motion. She exhibits no edema or deformity.  Neurological: She is alert and oriented to person, place, and time. No cranial nerve deficit. Coordination normal.  Skin: Skin is warm and dry. No rash noted. No erythema.  Psychiatric: She has a normal mood and affect. Her behavior is normal. Thought content normal.     LABORATORY DATA:  I have reviewed the data as listed Lab Results  Component Value Date   WBC 6.9 03/07/2018   HGB 13.1 03/07/2018   HCT 41.8 03/07/2018   MCV 83.8 03/07/2018   PLT 229 03/07/2018   Recent Labs    06/08/17 1244 06/20/17 1514 01/12/18 1022  NA 139 137 134*  K 3.9 3.9 3.6  CL 104 104 102  CO2 _0 GLUCOSE 258* 175* 144*  BUN _1 CREATININE 0.85 0.82 0.76  CALCIUM 9.7 9.2 9.2  GFRNONAA >60 >60 >60  GFRAA >60 >60 >60  PROT 7.9 8.0 8.5*  ALBUMIN 4.3 4.4 4.5  AST _2 ALT _3 ALKPHOS 126 108 118  BILITOT 0.3 0.6 0.5   Iron/TIBC/Ferritin/ %Sat    Component Value Date/Time   IRON 53 03/07/2018 0916   TIBC 415 03/07/2018 0916   FERRITIN 48 03/07/2018 0916   IRONPCTSAT 13 03/07/2018 0916     SPEP negative for M spike.   ASSESSMENT & PLAN:  1. Iron deficiency anemia, unspecified iron deficiency anemia type   2. Elevated total protein    #Iron deficiency anemia, status post IV Venofer  infusions.  Labs reviewed and discussed with patient. Iron store and hemoglobin have both improved. Iron saturation 13, ferritin 48.  I think she would benefit from additional IV Venofer x1. As she is not feeling well today, will hold IV Venofer today.  She can come back next week to have the IV iron infusion.  #Elevated serum protein, SPEP negative for monoclonal protein spikes.  Continue to monitor. #With discussed about gastroenterology work-up for iron deficiency anemia.  Patient reports that she already had an appointment to see Dr. Verl Blalock for colonoscopy.  Orders Placed This Encounter  Procedures  . CBC with Differential/Platelet    Standing Status:   Future    Standing Expiration Date:   03/10/2019  . Ferritin    Standing Status:   Future  Standing Expiration Date:   03/10/2019  . Iron and TIBC    Standing Status:   Future    Standing Expiration Date:   03/10/2019    All questions were answered. The patient knows to call the clinic with any problems questions or concerns.  Return of visit: 3 months   Earlie Server, MD, PhD Hematology Oncology [facility] Pager- [phone] 03/09/2018

## 2018-03-09 NOTE — Progress Notes (Signed)
Patient here for follow up. She has chest congestion and dry cough.

## 2018-03-14 ENCOUNTER — Inpatient Hospital Stay: Payer: Medicare Other

## 2018-03-14 VITALS — BP 112/76 | HR 99 | Resp 18

## 2018-03-14 DIAGNOSIS — D509 Iron deficiency anemia, unspecified: Secondary | ICD-10-CM | POA: Diagnosis not present

## 2018-03-14 MED ORDER — SODIUM CHLORIDE 0.9 % IV SOLN
Freq: Once | INTRAVENOUS | Status: AC
  Administered 2018-03-14: 14:00:00 via INTRAVENOUS
  Filled 2018-03-14: qty 250

## 2018-03-14 MED ORDER — IRON SUCROSE 20 MG/ML IV SOLN
200.0000 mg | Freq: Once | INTRAVENOUS | Status: AC
  Administered 2018-03-14: 200 mg via INTRAVENOUS

## 2018-03-14 MED ORDER — SODIUM CHLORIDE 0.9 % IV SOLN: 200.0000 mg | Freq: Once | INTRAVENOUS | Status: DC

## 2018-03-14 NOTE — Patient Instructions (Signed)
Iron Sucrose injection What is this medicine? IRON SUCROSE (AHY ern SOO krohs) is an iron complex. Iron is used to make healthy red blood cells, which carry oxygen and nutrients throughout the body. This medicine is used to treat iron deficiency anemia in people with chronic kidney disease. This medicine may be used for other purposes; ask your health care provider or pharmacist if you have questions. COMMON BRAND NAME(S): Venofer What should I tell my health care provider before I take this medicine? They need to know if you have any of these conditions: -anemia not caused by low iron levels -heart disease -high levels of iron in the blood -kidney disease -liver disease -an unusual or allergic reaction to iron, other medicines, foods, dyes, or preservatives -pregnant or trying to get pregnant -breast-feeding How should I use this medicine? This medicine is for infusion into a vein. It is given by a health care professional in a hospital or clinic setting. Talk to your pediatrician regarding the use of this medicine in children. While this drug may be prescribed for children as young as 2 years for selected conditions, precautions do apply. Overdosage: If you think you have taken too much of this medicine contact a poison control center or emergency room at once. NOTE: This medicine is only for you. Do not share this medicine with others. What if I miss a dose? It is important not to miss your dose. Call your doctor or health care professional if you are unable to keep an appointment. What may interact with this medicine? Do not take this medicine with any of the following medications: -deferoxamine -dimercaprol -other iron products This medicine may also interact with the following medications: -chloramphenicol -deferasirox This list may not describe all possible interactions. Give your health care provider a list of all the medicines, herbs, non-prescription drugs, or dietary  supplements you use. Also tell them if you smoke, drink alcohol, or use illegal drugs. Some items may interact with your medicine. What should I watch for while using this medicine? Visit your doctor or healthcare professional regularly. Tell your doctor or healthcare professional if your symptoms do not start to get better or if they get worse. You may need blood work done while you are taking this medicine. You may need to follow a special diet. Talk to your doctor. Foods that contain iron include: whole grains/cereals, dried fruits, beans, or peas, leafy green vegetables, and organ meats (liver, kidney). What side effects may I notice from receiving this medicine? Side effects that you should report to your doctor or health care professional as soon as possible: -allergic reactions like skin rash, itching or hives, swelling of the face, lips, or tongue -breathing problems -changes in blood pressure -cough -fast, irregular heartbeat -feeling faint or lightheaded, falls -fever or chills -flushing, sweating, or hot feelings -joint or muscle aches/pains -seizures -swelling of the ankles or feet -unusually weak or tired Side effects that usually do not require medical attention (report to your doctor or health care professional if they continue or are bothersome): -diarrhea -feeling achy -headache -irritation at site where injected -nausea, vomiting -stomach upset -tiredness This list may not describe all possible side effects. Call your doctor for medical advice about side effects. You may report side effects to FDA at 1-800-FDA-1088. Where should I keep my medicine? This drug is given in a hospital or clinic and will not be stored at home. NOTE: This sheet is a summary. It may not cover all possible information. If   you have questions about this medicine, talk to your doctor, pharmacist, or health care provider.  2018 Elsevier/Gold Standard (2011-02-18 17:14:35)  

## 2018-03-15 NOTE — Telephone Encounter (Signed)
Pt returned call and informed me she received my voicemail and understood about stopping her blood thinner.

## 2018-03-17 ENCOUNTER — Ambulatory Visit: Payer: Medicare Other

## 2018-03-20 ENCOUNTER — Encounter: Payer: Medicare Other | Admitting: Physical Therapy

## 2018-03-21 ENCOUNTER — Encounter
Admission: RE | Disposition: A | Discharge status: Home / Self Care | Payer: Self-pay | Source: Ambulatory Visit | Attending: Gastroenterology

## 2018-03-21 ENCOUNTER — Ambulatory Visit: Payer: Medicare Other | Admitting: Anesthesiology

## 2018-03-21 ENCOUNTER — Ambulatory Visit
Admission: RE | Admit: 2018-03-21 | Discharge: 2018-03-21 | Disposition: A | Discharge status: Home / Self Care | Payer: Medicare Other | Source: Ambulatory Visit | Attending: Gastroenterology | Admitting: Gastroenterology

## 2018-03-21 DIAGNOSIS — Z79899 Other long term (current) drug therapy: Secondary | ICD-10-CM | POA: Diagnosis not present

## 2018-03-21 DIAGNOSIS — E1140 Type 2 diabetes mellitus with diabetic neuropathy, unspecified: Secondary | ICD-10-CM | POA: Insufficient documentation

## 2018-03-21 DIAGNOSIS — D500 Iron deficiency anemia secondary to blood loss (chronic): Secondary | ICD-10-CM | POA: Diagnosis not present

## 2018-03-21 DIAGNOSIS — K640 First degree hemorrhoids: Secondary | ICD-10-CM | POA: Diagnosis not present

## 2018-03-21 DIAGNOSIS — E039 Hypothyroidism, unspecified: Secondary | ICD-10-CM | POA: Insufficient documentation

## 2018-03-21 DIAGNOSIS — I10 Essential (primary) hypertension: Secondary | ICD-10-CM | POA: Insufficient documentation

## 2018-03-21 DIAGNOSIS — M5481 Occipital neuralgia: Secondary | ICD-10-CM | POA: Diagnosis not present

## 2018-03-21 DIAGNOSIS — Z79890 Hormone replacement therapy (postmenopausal): Secondary | ICD-10-CM | POA: Insufficient documentation

## 2018-03-21 DIAGNOSIS — M797 Fibromyalgia: Secondary | ICD-10-CM | POA: Diagnosis not present

## 2018-03-21 DIAGNOSIS — K635 Polyp of colon: Secondary | ICD-10-CM | POA: Diagnosis not present

## 2018-03-21 DIAGNOSIS — D125 Benign neoplasm of sigmoid colon: Secondary | ICD-10-CM | POA: Diagnosis not present

## 2018-03-21 DIAGNOSIS — Z8673 Personal history of transient ischemic attack (TIA), and cerebral infarction without residual deficits: Secondary | ICD-10-CM | POA: Insufficient documentation

## 2018-03-21 DIAGNOSIS — D6852 Prothrombin gene mutation: Secondary | ICD-10-CM | POA: Insufficient documentation

## 2018-03-21 DIAGNOSIS — G4733 Obstructive sleep apnea (adult) (pediatric): Secondary | ICD-10-CM | POA: Diagnosis not present

## 2018-03-21 DIAGNOSIS — Z96652 Presence of left artificial knee joint: Secondary | ICD-10-CM | POA: Insufficient documentation

## 2018-03-21 DIAGNOSIS — Z7901 Long term (current) use of anticoagulants: Secondary | ICD-10-CM | POA: Diagnosis not present

## 2018-03-21 DIAGNOSIS — M1990 Unspecified osteoarthritis, unspecified site: Secondary | ICD-10-CM | POA: Insufficient documentation

## 2018-03-21 DIAGNOSIS — F329 Major depressive disorder, single episode, unspecified: Secondary | ICD-10-CM | POA: Diagnosis not present

## 2018-03-21 DIAGNOSIS — E785 Hyperlipidemia, unspecified: Secondary | ICD-10-CM | POA: Insufficient documentation

## 2018-03-21 DIAGNOSIS — Z7984 Long term (current) use of oral hypoglycemic drugs: Secondary | ICD-10-CM | POA: Insufficient documentation

## 2018-03-21 DIAGNOSIS — Z7982 Long term (current) use of aspirin: Secondary | ICD-10-CM | POA: Diagnosis not present

## 2018-03-21 DIAGNOSIS — D509 Iron deficiency anemia, unspecified: Secondary | ICD-10-CM | POA: Diagnosis not present

## 2018-03-21 HISTORY — PX: COLONOSCOPY WITH PROPOFOL: SHX5780

## 2018-03-21 HISTORY — DX: Cardiac arrhythmia, unspecified: I49.9

## 2018-03-21 HISTORY — PX: ESOPHAGOGASTRODUODENOSCOPY (EGD) WITH PROPOFOL: SHX5813

## 2018-03-21 HISTORY — DX: Cerebral infarction, unspecified: I63.9

## 2018-03-21 SURGERY — COLONOSCOPY WITH PROPOFOL
Anesthesia: General

## 2018-03-21 MED ORDER — PROPOFOL 10 MG/ML IV BOLUS
INTRAVENOUS | Status: DC | PRN
  Administered 2018-03-21: 100 mg via INTRAVENOUS
  Administered 2018-03-21: 40 mg via INTRAVENOUS

## 2018-03-21 MED ORDER — LIDOCAINE HCL (CARDIAC) PF 100 MG/5ML IV SOSY
PREFILLED_SYRINGE | INTRAVENOUS | Status: DC | PRN
  Administered 2018-03-21: 40 mg via INTRATRACHEAL
  Administered 2018-03-21: 60 mg via INTRATRACHEAL

## 2018-03-21 MED ORDER — FENTANYL CITRATE (PF) 100 MCG/2ML IJ SOLN
INTRAMUSCULAR | Status: AC
  Filled 2018-03-21: qty 2

## 2018-03-21 MED ORDER — ONDANSETRON HCL 4 MG/2ML IJ SOLN
INTRAMUSCULAR | Status: AC
  Administered 2018-03-21: 4 mg via INTRAVENOUS
  Filled 2018-03-21: qty 2

## 2018-03-21 MED ORDER — ONDANSETRON HCL 4 MG/2ML IJ SOLN
4.0000 mg | Freq: Once | INTRAMUSCULAR | Status: AC
  Administered 2018-03-21: 4 mg via INTRAVENOUS

## 2018-03-21 MED ORDER — PROPOFOL 500 MG/50ML IV EMUL
INTRAVENOUS | Status: DC | PRN
  Administered 2018-03-21: 100 ug/kg/min via INTRAVENOUS

## 2018-03-21 MED ORDER — FENTANYL CITRATE (PF) 100 MCG/2ML IJ SOLN
INTRAMUSCULAR | Status: DC | PRN
  Administered 2018-03-21: 50 ug via INTRAVENOUS

## 2018-03-21 MED ORDER — SODIUM CHLORIDE 0.9 % IV SOLN
INTRAVENOUS | Status: DC
  Administered 2018-03-21: 09:00:00 via INTRAVENOUS

## 2018-03-21 MED ORDER — ONDANSETRON HCL 4 MG/2ML IJ SOLN
INTRAMUSCULAR | Status: AC
  Filled 2018-03-21: qty 2

## 2018-03-21 MED ORDER — ONDANSETRON HCL 4 MG/2ML IJ SOLN
INTRAMUSCULAR | Status: DC | PRN
  Administered 2018-03-21: 4 mg via INTRAVENOUS

## 2018-03-21 NOTE — Op Note (Signed)
[facility] Gastroenterology Patient Name: Rome Schlauch Procedure Date: 03/21/2018 8:31 AM MRN: [id number] Account #: [id number] Date of Birth: Oct 29, 1965 Admit Type: Outpatient Age: 52 Room: [facility] ENDO ROOM 4 Gender: Female Note Status: Finalized Procedure:            Upper GI endoscopy Indications:          Iron deficiency anemia Providers:            Lucilla Lame MD, MD Referring MD:         No Local Md, MD (Referring MD) Medicines:            Propofol per Anesthesia Complications:        No immediate complications. Procedure:            Pre-Anesthesia Assessment:                       - Prior to the procedure, a History and Physical was                        performed, and patient medications and allergies were                        reviewed. The patient's tolerance of previous                        anesthesia was also reviewed. The risks and benefits of                        the procedure and the sedation options and risks were                        discussed with the patient. All questions were                        answered, and informed consent was obtained. Prior                        Anticoagulants: The patient has taken no previous                        anticoagulant or antiplatelet agents. ASA Grade                        Assessment: II - A patient with mild systemic disease.                        After reviewing the risks and benefits, the patient was                        deemed in satisfactory condition to undergo the                        procedure.                       After obtaining informed consent, the endoscope was                        passed under direct vision. Throughout the procedure,  the patient's blood pressure, pulse, and oxygen                        saturations were monitored continuously. The Endoscope                        was introduced through the mouth, and advanced to the     second part of duodenum. The upper GI endoscopy was                        accomplished without difficulty. The patient tolerated                        the procedure well. Findings:      The esophagus was normal.      The stomach was normal.      The examined duodenum was normal. Impression:           - Normal esophagus.                       - Normal stomach.                       - Normal examined duodenum.                       - No specimens collected. Recommendation:       - Perform a colonoscopy today.                       - Repeat colonoscopy in 10 years for screening unless                        any change in family history or lower GI problems. Procedure Code(s):    --- Professional ---                       [phone], Esophagogastroduodenoscopy, flexible, transoral;                        diagnostic, including collection of specimen(s) by                        brushing or washing, when performed (separate procedure) Diagnosis Code(s):    --- Professional ---                       D50.9, Iron deficiency anemia, unspecified CPT copyright 2018 American Medical Association. All rights reserved. The codes documented in this report are preliminary and upon coder review may  be revised to meet current compliance requirements. Lucilla Lame MD, MD 03/21/2018 8:57:10 AM This report has been signed electronically. Number of Addenda: 0 Note Initiated On: 03/21/2018 8:31 AM      [facility]

## 2018-03-21 NOTE — Anesthesia Postprocedure Evaluation (Signed)
Anesthesia Post Note  Patient: Christine Mcneil  Procedure(s) Performed: COLONOSCOPY WITH PROPOFOL (N/A ) ESOPHAGOGASTRODUODENOSCOPY (EGD) WITH PROPOFOL (N/A )  Patient location during evaluation: Endoscopy Anesthesia Type: General Level of consciousness: awake and alert Pain management: pain level controlled Vital Signs Assessment: post-procedure vital signs reviewed and stable Respiratory status: spontaneous breathing, nonlabored ventilation, respiratory function stable and patient connected to nasal cannula oxygen Cardiovascular status: blood pressure returned to baseline and stable Postop Assessment: no apparent nausea or vomiting Anesthetic complications: no     Last Vitals:  Vitals:   03/21/18 0930 03/21/18 0940  BP: 121/79 116/83  Pulse: (!) 103 98  Resp: 17 17  Temp:    SpO2: 97% 99%    Last Pain:  Vitals:   03/21/18 0910  TempSrc: Tympanic  PainSc:                  [name]

## 2018-03-21 NOTE — Anesthesia Procedure Notes (Signed)
Procedure Name: MAC Date/Time: 03/21/2018 8:53 AM Performed by: Demetrius Charity, CRNA Pre-anesthesia Checklist: Patient identified, Emergency Drugs available, Suction available, Patient being monitored and Timeout performed Patient Re-evaluated:Patient Re-evaluated prior to induction Oxygen Delivery Method: Nasal cannula

## 2018-03-21 NOTE — Op Note (Signed)
[facility] Gastroenterology Patient Name: Christine Mcneil Procedure Date: 03/21/2018 8:18 AM MRN: [id number] Account #: [id number] Date of Birth: [birth year] Admit Type: Outpatient Age: 52 Room: [facility] ENDO ROOM 4 Gender: Female Note Status: Finalized Procedure:            Colonoscopy Indications:          Iron deficiency anemia Providers:            Lucilla Lame MD, MD Referring MD:         No Local Md, MD (Referring MD) Medicines:            Propofol per Anesthesia Complications:        No immediate complications. Procedure:            Pre-Anesthesia Assessment:                       - Prior to the procedure, a History and Physical was                        performed, and patient medications and allergies were                        reviewed. The patient's tolerance of previous                        anesthesia was also reviewed. The risks and benefits of                        the procedure and the sedation options and risks were                        discussed with the patient. All questions were                        answered, and informed consent was obtained. Prior                        Anticoagulants: The patient has taken no previous                        anticoagulant or antiplatelet agents. ASA Grade                        Assessment: II - A patient with mild systemic disease.                        After reviewing the risks and benefits, the patient was                        deemed in satisfactory condition to undergo the                        procedure.                       After obtaining informed consent, the colonoscope was                        passed under direct vision. Throughout the procedure,  the patient's blood pressure, pulse, and oxygen                        saturations were monitored continuously. The                        Colonoscope was introduced through the anus and                        advanced to the  the cecum, identified by appendiceal                        orifice and ileocecal valve. The colonoscopy was                        performed without difficulty. The patient tolerated the                        procedure well. The quality of the bowel preparation                        was good. Findings:      The perianal and digital rectal examinations were normal.      Four sessile polyps were found in the sigmoid colon. The polyps were 1       to 2 mm in size. These polyps were removed with a cold biopsy forceps.       Resection and retrieval were complete.      Non-bleeding internal hemorrhoids were found during retroflexion. The       hemorrhoids were Grade I (internal hemorrhoids that do not prolapse). Impression:           - Four 1 to 2 mm polyps in the sigmoid colon, removed                        with a cold biopsy forceps. Resected and retrieved.                       - Non-bleeding internal hemorrhoids. Recommendation:       - Discharge patient to home.                       - Resume previous diet.                       - Continue present medications.                       - Await pathology results.                       - To visualize the small bowel, perform video capsule                        endoscopy.                       - Repeat colonoscopy in 5 years if polyp adenoma and 10                        years if hyperplastic Procedure Code(s):    --- Professional ---  45380, Colonoscopy, flexible; with biopsy, single or                        multiple Diagnosis Code(s):    --- Professional ---                       D50.9, Iron deficiency anemia, unspecified                       D12.5, Benign neoplasm of sigmoid colon CPT copyright 2018 American Medical Association. All rights reserved. The codes documented in this report are preliminary and upon coder review may  be revised to meet current compliance requirements. Lucilla Lame MD, MD 03/21/2018  9:15:18 AM This report has been signed electronically. Number of Addenda: 0 Note Initiated On: 03/21/2018 8:18 AM Scope Withdrawal Time: 0 hours 10 minutes 21 seconds  Total Procedure Duration: 0 hours 14 minutes 27 seconds       [facility]

## 2018-03-21 NOTE — Interval H&P Note (Signed)
History and Physical Interval Note:  03/21/2018 8:32 AM  Christine Mcneil  has presented today for surgery, with the diagnosis of Iron deficiency anemia D50.0  The various methods of treatment have been discussed with the patient and family. After consideration of risks, benefits and other options for treatment, the patient has consented to  Procedure(s): COLONOSCOPY WITH PROPOFOL (N/A) ESOPHAGOGASTRODUODENOSCOPY (EGD) WITH PROPOFOL (N/A) as a surgical intervention .  The patient's history has been reviewed, patient examined, no change in status, stable for surgery.  I have reviewed the patient's chart and labs.  Questions were answered to the patient's satisfaction.     Krikor Willet [employer]

## 2018-03-21 NOTE — Anesthesia Post-op Follow-up Note (Signed)
Anesthesia QCDR form completed.        

## 2018-03-21 NOTE — Transfer of Care (Signed)
Immediate Anesthesia Transfer of Care Note  Patient: Christine Mcneil  Procedure(s) Performed: COLONOSCOPY WITH PROPOFOL (N/A ) ESOPHAGOGASTRODUODENOSCOPY (EGD) WITH PROPOFOL (N/A )  Patient Location: PACU  Anesthesia Type:MAC  Level of Consciousness: drowsy  Airway & Oxygen Therapy: Patient Spontanous Breathing  Post-op Assessment: Report given to RN  Post vital signs: stable  Last Vitals:  Vitals Value Taken Time  BP 97/61 03/21/2018  9:19 AM  Temp    Pulse 100 03/21/2018  9:19 AM  Resp 18 03/21/2018  9:19 AM  SpO2 95 % 03/21/2018  9:19 AM  Vitals shown include unvalidated device data.  Last Pain:  Vitals:   03/21/18 0830  TempSrc: Tympanic  PainSc: 0-No pain         Complications: No apparent anesthesia complications

## 2018-03-21 NOTE — Anesthesia Preprocedure Evaluation (Signed)
Anesthesia Evaluation  Patient identified by MRN, date of birth, ID band Patient awake    Reviewed: Allergy & Precautions, H&P , NPO status , Patient's Chart, lab work & pertinent test results  History of Anesthesia Complications Negative for: history of anesthetic complications  Airway Mallampati: III  TM Distance: <3 FB Neck ROM: limited    Dental  (+) Chipped   Pulmonary neg shortness of breath, sleep apnea ,           Cardiovascular Exercise Tolerance: Good hypertension, (-) Past MI + dysrhythmias      Neuro/Psych  Headaches, PSYCHIATRIC DISORDERS TIA Neuromuscular disease CVA    GI/Hepatic Neg liver ROS, GERD  Medicated and Controlled,  Endo/Other  diabetes, Type 2, Oral Hypoglycemic AgentsHypothyroidism   Renal/GU negative Renal ROS  negative genitourinary   Musculoskeletal  (+) Arthritis , Fibromyalgia -  Abdominal   Peds  Hematology negative hematology ROS (+)   Anesthesia Other Findings Past Medical History: No date: Abdominal pain No date: Anxiety and depression No date: Atypical facial pain No date: Bilateral occipital neuralgia No date: Cervical spondylosis without myelopathy No date: Chronic daily headache No date: Chronic pain No date: Chronic prostatitis No date: Chronic thoracic back pain No date: Depression No date: Diabetes mellitus without complication (HCC) No date: Diabetes mellitus, type II (HCC) No date: Dysphagia No date: Dysrhythmia No date: Fibromyalgia No date: Hyperlipidemia No date: Hypertension No date: Insomnia No date: Iron deficiency anemia No date: Left hip pain No date: Low back pain No date: Migraines No date: Numbness No date: Optic neuropathy No date: OSA (obstructive sleep apnea) No date: Polyarthralgia No date: Primary osteoarthritis of both hips No date: Prothrombin gene mutation (HCC) No date: Pseudotumor cerebri No date: Rectal bleeding No date: Right  shoulder pain No date: Rotator cuff syndrome No date: Stroke [facility]) No date: Thyroid disease 05/27/2017: TIA (transient ischemic attack)  Past Surgical History: No date: ABDOMINAL HYSTERECTOMY     Comment:  total No date: CHOLECYSTECTOMY No date: JOINT REPLACEMENT No date: KNEE SURGERY; Left No date: ROTATOR CUFF REPAIR; Right No date: spinal neurostimulator     Reproductive/Obstetrics negative OB ROS                             Anesthesia Physical Anesthesia Plan  ASA: III  Anesthesia Plan: General   Post-op Pain Management:    Induction: Intravenous  PONV Risk Score and Plan: Propofol infusion and TIVA  Airway Management Planned: Natural Airway and Nasal Cannula  Additional Equipment:   Intra-op Plan:   Post-operative Plan:   Informed Consent: I have reviewed the patients History and Physical, chart, labs and discussed the procedure including the risks, benefits and alternatives for the proposed anesthesia with the patient or authorized representative who has indicated his/her understanding and acceptance.   Dental Advisory Given  Plan Discussed with: Anesthesiologist, CRNA and Surgeon  Anesthesia Plan Comments: (Patient consented for risks of anesthesia including but not limited to:  - adverse reactions to medications - risk of intubation if required - damage to teeth, lips or other oral mucosa - sore throat or hoarseness - Damage to heart, brain, lungs or loss of life  Patient voiced understanding.)        Anesthesia Quick Evaluation

## 2018-03-22 ENCOUNTER — Encounter: Payer: Self-pay | Admitting: Gastroenterology

## 2018-03-22 ENCOUNTER — Ambulatory Visit: Payer: Medicare Other | Admitting: Physical Therapy

## 2018-03-22 DIAGNOSIS — R269 Unspecified abnormalities of gait and mobility: Secondary | ICD-10-CM | POA: Diagnosis not present

## 2018-03-22 DIAGNOSIS — M6281 Muscle weakness (generalized): Secondary | ICD-10-CM

## 2018-03-22 DIAGNOSIS — R296 Repeated falls: Secondary | ICD-10-CM

## 2018-03-22 NOTE — Therapy (Signed)
[facility] [address] [place], [place], [address] Phone: [phone]   Fax:  [phone]  Physical Therapy Treatment  Patient Details  Name: Christine Mcneil MRN: [id number] Date of Birth: Jul 29, 1965 Referring Provider (PT): Dr. Jennings Books   Encounter Date: 03/22/2018  PT End of Session - 03/22/18 1308    Visit Number  19    Number of Visits  21    Date for PT Re-Evaluation  04/17/18    PT Start Time  1301    PT Stop Time  1356    PT Time Calculation (min)  55 min    Equipment Utilized During Treatment  Gait belt    Activity Tolerance  Patient tolerated treatment well    Behavior During Therapy  [facility] for tasks assessed/performed       Past Medical History:  Diagnosis Date  . Abdominal pain   . Anxiety and depression   . Atypical facial pain   . Bilateral occipital neuralgia   . Cervical spondylosis without myelopathy   . Chronic daily headache   . Chronic pain   . Chronic prostatitis   . Chronic thoracic back pain   . Depression   . Diabetes mellitus without complication ([place])   . Diabetes mellitus, type II ([place])   . Dysphagia   . Dysrhythmia   . Fibromyalgia   . Hyperlipidemia   . Hypertension   . Insomnia   . Iron deficiency anemia   . Left hip pain   . Low back pain   . Migraines   . Numbness   . Optic neuropathy   . OSA (obstructive sleep apnea)   . Polyarthralgia   . Primary osteoarthritis of both hips   . Prothrombin gene mutation ([place])   . Pseudotumor cerebri   . Rectal bleeding   . Right shoulder pain   . Rotator cuff syndrome   . Stroke ([place])   . Thyroid disease   . TIA (transient ischemic attack) 05/27/2017    Past Surgical History:  Procedure Laterality Date  . ABDOMINAL HYSTERECTOMY     total  . CHOLECYSTECTOMY    . COLONOSCOPY WITH PROPOFOL N/A 03/21/2018   Procedure: COLONOSCOPY WITH PROPOFOL;  Surgeon: Lucilla Lame, MD;  Location: [facility] ENDOSCOPY;  Service: Endoscopy;  Laterality:  N/A;  . ESOPHAGOGASTRODUODENOSCOPY (EGD) WITH PROPOFOL N/A 03/21/2018   Procedure: ESOPHAGOGASTRODUODENOSCOPY (EGD) WITH PROPOFOL;  Surgeon: Lucilla Lame, MD;  Location: [facility] ENDOSCOPY;  Service: Endoscopy;  Laterality: N/A;  . JOINT REPLACEMENT    . KNEE SURGERY Left   . ROTATOR CUFF REPAIR Right   . spinal neurostimulator      There were no vitals filed for this visit.  Subjective Assessment - 03/22/18 1305    Subjective  Pt. notes she has feeling tired from procedure (colonoscopy) yesterday.  Pt. reports she has not been able to attend the gym yet due to being busy over the week with son and mother.      Pertinent History  Significant PMHx    Limitations  Standing;Walking;House hold activities    Patient Stated Goals  Increase LE strength/ balance/ independence with walking    Currently in Pain?  No/denies    Pain Score  0-No pain    Pain Onset  More than a month ago           TREATMENT  There Ex:  Seated Glute Sets on Therapy Ball (5 sec hold) x15 Seated [place] on Therapy Ball x20 Standing  Heel Raises x15 Standing B Lunges in // bars x15 each Standing B Hip Hikes on 6" Step x15 each Standing B 6" Step-Ups with Contralateral Hip Flexion x15 each Standing B Lateral Step Over 6" Steps x15 each TG knee flexion (midline with ball/ toe in/ toe out) x15 Resisted Walking Forward/Backward 95# x15 each        PT Long Term Goals - 02/20/18 1753      PT LONG TERM GOAL #1   Title  Pt. will be independent with HEP to increase B LE muscle strength 1/2 muscle grade to improve standing tolerance/ gait.      Baseline  R/L Hip Flex: 4/4+; R/L Knee Ext: 5/5;     Time  4    Period  Weeks    Status  Achieved    Target Date  11/22/17      PT LONG TERM GOAL #2   Title  Pt. will increase FOTO from 32 to 40 to improve functional mobility.     Baseline  FOTO: 40 12/06/17    Time  4    Period  Weeks    Status  Achieved    Target Date  11/10/17      PT LONG TERM GOAL #3    Title  Pt. will increase Berg balance score to >51/56 to decrease fall risk/ improve safety with gait.      Baseline  Berg 55/56 on 6/13    Time  4    Period  Weeks    Status  Achieved    Target Date  11/03/17      PT LONG TERM GOAL #4   Title  Pt. able to ambulate outside on grassy terrain with no LOB to promote independence with gait.      Baseline  increase fall risk on grass/ falls reported while gardening    Time  4    Period  Weeks    Status  Achieved    Target Date  11/22/17      PT LONG TERM GOAL #5   Title  Pt. will be able to perform SLS >30sec in B LE with no LOB to ensure ankle stability needed for ascending/descending stairs.    Baseline  Pt. able to hold SLS <5 sec without LOB.    Time  4    Period  Weeks    Status  Achieved    Target Date  12/06/17      PT LONG TERM GOAL #6   Title  Pt. will improve FOTO to 48 to signify an increase in pain free functional mobility.    Baseline  FOTO: 40 on 12/06/17; FOTO: 44 on 01/10/18    Time  4    Period  Weeks    Status  Unable to assess    Target Date  04/17/18      PT LONG TERM GOAL #7   Title  Pt. will report no falls in the next 4 weeks to demonstrate increased balance/ coordination with movement patterns.    Baseline  Reports a fall occurred 7/16; Pt. reported a fall on 9/14    Time  4    Period  Weeks    Status  Achieved    Target Date  02/07/18      PT LONG TERM GOAL #8   Title  Pt. will tolerate standing no increase in pain for >30 minutes in order to cook in kitchen.    Baseline  Unable to stand for  10 minutes without increase in pain of hip.; 9/30 Pt. reports that she is able to stand for 5 minutes without pain.    Time  4    Period  Weeks    Status  Partially Met    Target Date  04/17/18            Plan - 03/22/18 1309    Clinical Impression Statement  Pt. has made significant improvement upon goals and is ready to begin more gym-based exercises.  Pt. consistently reports less pain and has no falls  reported for several weeks.  Pt. performed well with therapeutic exercises and will continue to benefit from more regulated exercise program given to them at last visit.  Pt. encouraged to reach out to clinic if any regression occurs.  Pt. will be d/c to local gym at this time.      Clinical Presentation  Evolving    Clinical Decision Making  Moderate    Rehab Potential  Fair    PT Frequency  Biweekly    PT Duration  8 weeks    PT Treatment/Interventions  ADLs/Self Care Home Management;Aquatic Therapy;Gait training;Moist Heat;Cryotherapy;Stair training;Functional mobility training;Therapeutic activities;Balance training;Therapeutic exercise;Patient/family education;Neuromuscular re-education;Manual techniques;Passive range of motion    PT Next Visit Plan  d/c at this time.    PT Home Exercise Plan  see HEP    Consulted and Agree with Plan of Care  Patient       Patient will benefit from skilled therapeutic intervention in order to improve the following deficits and impairments:  Abnormal gait, Improper body mechanics, Pain, Postural dysfunction, Decreased coordination, Decreased activity tolerance, Decreased endurance, Decreased range of motion, Decreased strength, Obesity, Impaired flexibility, Difficulty walking, Decreased safety awareness, Decreased balance  Visit Diagnosis: Gait difficulty  Repeated falls  Muscle weakness (generalized)     Problem List Patient Active Problem List   Diagnosis Date Noted  . Polyp of sigmoid colon   . Iron deficiency anemia 01/19/2018  . 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 TIA (transient ischemic attack) 06/20/2017  . Primary osteoarthritis of both hips 06/20/2017  . Periodic limb movements of sleep 05/30/2017  . Shortness of breath 01/03/2017  . 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 Chronic prostatitis/chronic pelvic pain syndrome 05/28/2016  . Migraine without aura and without status migrainosus, not  intractable 05/11/2016  . Chronic anticoagulation 03/29/2016  . Encounter for monitoring opioid maintenance therapy 11/04/2015  . Dysphagia, neurologic 09/16/2015  . Numbness 09/16/2015  . Anti-cardiolipin antibody positive 09/01/2015  . Prothrombin gene mutation ([place]) 09/01/2015  . History of cerebral venous sinus thrombosis 06/27/2015  . Anemia, iron deficiency 10/02/2014  . Chronic thoracic back pain 07/24/2014  . Bilateral occipital neuralgia 04/11/2014  . Hypothyroidism, unspecified 10/17/2013  . Type 2 diabetes, HbA1c goal < 7% (HCC) 10/17/2013  . Cervicogenic headache 07/04/2013  . Cervical spondylosis without myelopathy 11/16/2012  . Rotator cuff syndrome 11/13/2012  . Sinus congestion 11/07/2012  . Abdominal pain 09/28/2012  . Rectal bleeding 09/28/2012  . Depression 02/22/2012  . GERD (gastroesophageal reflux disease) 02/22/2012  . Essential hypertension 02/22/2012  . Obesity, unspecified 02/22/2012  . Neuromuscular disorder ([place]) 02/22/2012  . Pain in joint, pelvic region and thigh 01/17/2012  . Insomnia secondary to chronic pain 10/04/2011  . Right shoulder pain 10/04/2011  . Atypical facial pain 10/03/2011  . Chronic daily headache 10/03/2011  . Fibromyalgia 10/03/2011  . Hyperlipidemia, unspecified 10/03/2011    Gwenlyn Saran, SPT  This entire session  was performed under direct supervision and direction of a licensed Chiropractor . I have personally read, edited and approve of the note as written.  Myles Gip PT, DPT [phone]  03/22/2018, 4:59 PM  [place] [facility] [address] [place], [place], [address] Phone: [phone]   Fax:  [phone]  Name: Christine Mcneil MRN: [id number] Date of Birth: January 30, 1966

## 2018-03-24 LAB — SURGICAL PATHOLOGY

## 2018-03-26 ENCOUNTER — Encounter: Payer: Self-pay | Admitting: Gastroenterology

## 2018-03-29 ENCOUNTER — Encounter: Payer: Self-pay | Admitting: Psychiatry

## 2018-03-29 ENCOUNTER — Other Ambulatory Visit: Payer: Self-pay

## 2018-03-29 ENCOUNTER — Ambulatory Visit (INDEPENDENT_AMBULATORY_CARE_PROVIDER_SITE_OTHER): Payer: Medicare Other | Admitting: Psychiatry

## 2018-03-29 ENCOUNTER — Telehealth: Payer: Self-pay

## 2018-03-29 VITALS — BP 148/102 | HR 103 | Temp 97.7°F | Wt 231.0 lb

## 2018-03-29 DIAGNOSIS — F4310 Post-traumatic stress disorder, unspecified: Secondary | ICD-10-CM

## 2018-03-29 DIAGNOSIS — F331 Major depressive disorder, recurrent, moderate: Secondary | ICD-10-CM

## 2018-03-29 MED ORDER — LAMOTRIGINE 100 MG PO TABS: ORAL_TABLET | ORAL | 0 refills | Status: DC

## 2018-03-29 MED ORDER — TRAZODONE HCL 50 MG PO TABS: 50.0000 mg | ORAL_TABLET | Freq: Every evening | ORAL | 0 refills | Status: DC | PRN

## 2018-03-29 MED ORDER — BUPROPION HCL ER (XL) 150 MG PO TB24: 150.0000 mg | ORAL_TABLET | Freq: Every day | ORAL | 0 refills | Status: DC

## 2018-03-29 MED ORDER — DULOXETINE HCL 30 MG PO CPEP: 90.0000 mg | ORAL_CAPSULE | Freq: Every day | ORAL | 0 refills | Status: DC

## 2018-03-29 MED ORDER — ARIPIPRAZOLE 5 MG PO TABS: 5.0000 mg | ORAL_TABLET | Freq: Every day | ORAL | 0 refills | Status: DC

## 2018-03-29 MED ORDER — LAMOTRIGINE 25 MG PO TABS: 25.0000 mg | ORAL_TABLET | Freq: Every day | ORAL | 0 refills | Status: DC

## 2018-03-29 NOTE — Telephone Encounter (Signed)
pa was entered- pending review.

## 2018-03-29 NOTE — Progress Notes (Signed)
[facility] Progress Note  03/29/2018 10:24 AM Christine Mcneil  MRN:  [id number]  Chief Complaint: ' I am here for follow up.' Chief Complaint    Follow-up; Medication Refill     HPI: Christine Mcneil is a 52 year old Caucasian female, divorced, lives in [place], on [place], has a history of depression, anger issues, PTSD, sleep problems, OSA-resolved, history of CVA, history of prothrombin gene mutation, iron deficiency, chronic pain, migraine headaches, presented to the clinic today for a follow-up visit.  Patient today reports she is compliant with her medications as prescribed.  She reports her mood symptoms as improved.  She denies any significant depression or anxiety symptoms.  She reports she is sleeping better.  She continues to be compliant with trazodone.  Patient continues to have some relationship conflict with her sister.  Patient reports she has been struggling with that since it is the holidays coming up.  She reports she has been invited to her mother's house to spend the holidays with her mother and her sister.  Patient however reports she is not ready to do so.  Discussed with patient to reach out to her therapist Ms. Peacock with her relationship struggles.  Patient denies any suicidality.  Patient denies any homicidality.  Patient denies any other concerns today. Visit Diagnosis:    ICD-10-CM   1. MDD (major depressive disorder), recurrent episode, moderate (HCC) F33.1 ARIPiprazole (ABILIFY) 5 MG tablet    buPROPion (WELLBUTRIN XL) 150 MG 24 hr tablet    DULoxetine (CYMBALTA) 30 MG capsule    lamoTRIgine (LAMICTAL) 100 MG tablet    lamoTRIgine (LAMICTAL) 25 MG tablet    traZODone (DESYREL) 50 MG tablet  2. PTSD (post-traumatic stress disorder) F43.10 ARIPiprazole (ABILIFY) 5 MG tablet    DULoxetine (CYMBALTA) 30 MG capsule    lamoTRIgine (LAMICTAL) 25 MG tablet    traZODone (DESYREL) 50 MG tablet    Past Psychiatric History: Reviewed past psychiatric history from my progress note on  08/16/2017.  Past trials of Effexor, Klonopin  Past Medical History:  Past Medical History:  Diagnosis Date  . Abdominal pain   . Anxiety and depression   . Atypical facial pain   . Bilateral occipital neuralgia   . Cervical spondylosis without myelopathy   . Chronic daily headache   . Chronic pain   . Chronic prostatitis   . Chronic thoracic back pain   . Depression   . Diabetes mellitus without complication ([place])   . Diabetes mellitus, type II ([place])   . Dysphagia   . Dysrhythmia   . Fibromyalgia   . Hyperlipidemia   . Hypertension   . Insomnia   . Iron deficiency anemia   . Left hip pain   . Low back pain   . Migraines   . Numbness   . Optic neuropathy   . OSA (obstructive sleep apnea)   . Polyarthralgia   . Primary osteoarthritis of both hips   . Prothrombin gene mutation ([place])   . Pseudotumor cerebri   . Rectal bleeding   . Right shoulder pain   . Rotator cuff syndrome   . Stroke ([place])   . Thyroid disease   . TIA (transient ischemic attack) 05/27/2017    Past Surgical History:  Procedure Laterality Date  . ABDOMINAL HYSTERECTOMY     total  . CHOLECYSTECTOMY    . COLONOSCOPY WITH PROPOFOL N/A 03/21/2018   Procedure: COLONOSCOPY WITH PROPOFOL;  Surgeon: Lucilla Lame, MD;  Location: [facility] ENDOSCOPY;  Service: Endoscopy;  Laterality: N/A;  . ESOPHAGOGASTRODUODENOSCOPY (EGD) WITH PROPOFOL N/A 03/21/2018   Procedure: ESOPHAGOGASTRODUODENOSCOPY (EGD) WITH PROPOFOL;  Surgeon: Lucilla Lame, MD;  Location: [facility] ENDOSCOPY;  Service: Endoscopy;  Laterality: N/A;  . JOINT REPLACEMENT    . KNEE SURGERY Left   . ROTATOR CUFF REPAIR Right   . spinal neurostimulator      Family Psychiatric History: Reviewed family psychiatric history from my progress note on 08/16/2017  Family History:  Family History  Problem Relation Age of Onset  . Diabetes Mother   . Hyperlipidemia Mother   . Hypertension Mother   . COPD Mother   . CVA Mother   . Anemia Mother   . Diabetes  Father   . Hyperlipidemia Father   . Hypertension Father   . Thyroid disease Father   . Alcohol abuse Father   . Peripheral vascular disease Sister   . Hypertension Sister   . Anxiety disorder Son   . Depression Son   . Bipolar disorder Son   . Seizures Son     Social History: I have reviewed social history from my progress note on 08/16/2017 Social History   Socioeconomic History  . Marital status: Divorced    Spouse name: Not on file  . Number of children: 1  . Years of education: Not on file  . Highest education level: High school graduate  Occupational History    Comment: disablitiy  Social Needs  . Financial resource strain: Not hard at all  . Food insecurity:    Worry: Never true    Inability: Never true  . Transportation needs:    Medical: No    Non-medical: No  Tobacco Use  . Smoking status: Never Smoker  . Smokeless tobacco: Never Used  Substance and Sexual Activity  . Alcohol use: No  . Drug use: No  . Sexual activity: Not Currently  Lifestyle  . Physical activity:    Days per week: 0 days    Minutes per session: 0 min  . Stress: Very much  Relationships  . Social connections:    Talks on phone: Once a week    Gets together: Never    Attends religious service: More than 4 times per year    Active member of club or organization: No    Attends meetings of clubs or organizations: Never    Relationship status: Divorced  Other Topics Concern  . Not on file  Social History Narrative  . Not on file    Allergies:  Allergies  Allergen Reactions  . Amoxicillin Itching  . Amoxapine Itching  . Dapagliflozin     Other reaction(s): Other (See Comments) Thrush & vaginal itching  . Keflex [Cephalexin] Itching    Metabolic Disorder Labs: Lab Results  Component Value Date   HGBA1C 9.9 (H) 06/21/2017   MPG 237.43 06/21/2017   Lab Results  Component Value Date   PROLACTIN 5.5 09/06/2017   Lab Results  Component Value Date   CHOL 277 (H) 06/21/2017    TRIG 196 (H) 06/21/2017   HDL 64 06/21/2017   CHOLHDL 4.3 06/21/2017   VLDL 39 06/21/2017   LDLCALC 174 (H) 06/21/2017   Lab Results  Component Value Date   TSH 2.890 09/06/2017   TSH 3.176 01/04/2017    Therapeutic Level Labs: No results found for: LITHIUM No results found for: VALPROATE No components found for:  CBMZ  Current Medications: Current Outpatient Medications  Medication Sig Dispense Refill  . ACCU-CHEK SOFTCLIX LANCETS lancets     .  acetaZOLAMIDE (DIAMOX) 125 MG tablet     . AIMOVIG 70 MG/ML SOAJ Inject 70 mg into the skin every 28 (twenty-eight) days.    . ARIPiprazole (ABILIFY) 5 MG tablet Take 1 tablet (5 mg total) by mouth daily. 90 tablet 0  . aspirin-acetaminophen-caffeine (EXCEDRIN MIGRAINE) 250-250-65 MG tablet Take 2 tablets by mouth every 8 (eight) hours as needed for headache or migraine.     Marland Kitchen atorvastatin (LIPITOR) 80 MG tablet Take 80 mg by mouth daily at 6 PM.    . Blood Glucose Monitoring Suppl (ACCU-CHEK AVIVA PLUS) w/Device KIT     . buPROPion (WELLBUTRIN XL) 150 MG 24 hr tablet Take 1 tablet (150 mg total) by mouth daily. 90 tablet 0  . Carboxymethylcellulose Sod PF (REFRESH PLUS) 0.5 % SOLN Apply to eye.    . Cholecalciferol (VITAMIN D3) 1000 units CAPS Take 1 capsule by mouth daily.    . clonazePAM (KLONOPIN) 0.5 MG tablet May use up to once daily as needed for facial pain    . clotrimazole (LOTRIMIN) 1 % cream     . cyclobenzaprine (FLEXERIL) 5 MG tablet     . dabigatran (PRADAXA) 150 MG CAPS capsule Take by mouth.    . diclofenac (FLECTOR) 1.3 % PTCH     . diltiazem (CARDIZEM CD) 240 MG 24 hr capsule Take by mouth. Take 1 capsule by mouth once daily    . DULoxetine (CYMBALTA) 30 MG capsule Take 3 capsules (90 mg total) by mouth daily. 270 capsule 0  . esomeprazole (NEXIUM) 40 MG capsule Take 40 mg by mouth daily.    . ferrous sulfate 325 (65 FE) MG tablet Take by mouth. Take 325 mg daily with breakfast    . fluticasone (FLONASE) 50 MCG/ACT  nasal spray     . glipiZIDE (GLUCOTROL) 5 MG tablet Take 1 tablet (5 mg total) by mouth 2 (two) times daily before a meal. 60 tablet 0  . hydrOXYzine (ATARAX/VISTARIL) 50 MG tablet Take 1 tablet (50 mg total) by mouth 3 (three) times daily as needed for anxiety. 270 tablet 0  . ketoconazole (NIZORAL) 2 % cream 1 application.    Marland Kitchen lamoTRIgine (LAMICTAL) 100 MG tablet To be combined with 25 mg 90 tablet 0  . lamoTRIgine (LAMICTAL) 25 MG tablet Take 1 tablet (25 mg total) by mouth daily. To be combined with 100 mg 90 tablet 0  . levothyroxine (SYNTHROID, LEVOTHROID) 100 MCG tablet Take 100 mcg by mouth daily.    Marland Kitchen loratadine (CLARITIN) 10 MG tablet Take 10 mg by mouth daily as needed for allergies.    Marland Kitchen MAGNESIUM PO Take 500 mg by mouth 2 (two) times daily.    . metFORMIN (GLUCOPHAGE) 500 MG tablet Take 500 mg by mouth 2 (two) times daily.    . metFORMIN (GLUCOPHAGE-XR) 500 MG 24 hr tablet Take 500 mg by mouth 2 (two) times daily.     Gean [place]chwood ER 100 MG 12 hr tablet Take 1 tablet by mouth 2 (two) times daily.    . ondansetron (ZOFRAN-ODT) 4 MG disintegrating tablet Take 4 mg by mouth every 8 (eight) hours as needed for nausea or vomiting.    . pioglitazone (ACTOS) 15 MG tablet Take by mouth. Take 1 tablet by mouth once daily    . promethazine (PHENERGAN) 25 MG tablet Take 25 mg by mouth every 8 (eight) hours as needed for nausea or vomiting.    . propranolol (INDERAL) 10 MG tablet Take 1 tablet by mouth 3 (  three) times daily.    . quinapril (ACCUPRIL) 10 MG tablet Take 10 mg by mouth daily.    . ranitidine (ZANTAC) 150 MG tablet Take 150 mg by mouth 2 (two) times daily.    . rizatriptan (MAXALT) 10 MG tablet Take 1 tab at migraine onset. May repeat in 2 hours prn.  Limit to 2 in 24 hours and to 2 days weekly.    . sitaGLIPtin (JANUVIA) 100 MG tablet Take 100 mg by mouth daily.    . traZODone (DESYREL) 50 MG tablet Take 1 tablet (50 mg total) by mouth at bedtime as needed. pls do not combine with  Ambien 90 tablet 0  . verapamil (CALAN-SR) 240 MG CR tablet Take 1 tablet by mouth daily.    . dabigatran (PRADAXA) 150 MG CAPS capsule Take 150 mg by mouth 2 (two) times daily.    . metoCLOPramide (REGLAN) 10 MG tablet Take 1 tablet (10 mg total) by mouth every 8 (eight) hours as needed for up to 3 days for nausea. 20 tablet 0   No current facility-administered medications for this visit.      Musculoskeletal: Strength & Muscle Tone: within normal limits Gait & Station: normal Patient leans: N/A  Psychiatric Specialty Exam: Review of Systems  Psychiatric/Behavioral: Negative for depression. The patient is not nervous/anxious and does not have insomnia.   All other systems reviewed and are negative.   Blood pressure (!) 148/102, pulse (!) 103, temperature 97.7 F (36.5 C), temperature source Oral, weight 231 lb (104.8 kg).Body mass index is 37.28 kg/m.  General Appearance: Casual  Eye Contact:  Fair  Speech:  Clear and Coherent  Volume:  Normal  Mood:  Euthymic  Affect:  Congruent  Thought Process:  Goal Directed and Descriptions of Associations: Intact  Orientation:  Full (Time, Place, and Person)  Thought Content: Logical   Suicidal Thoughts:  No  Homicidal Thoughts:  No  Memory:  Immediate;   Fair Recent;   Fair Remote;   Fair  Judgement:  Fair  Insight:  Fair  Psychomotor Activity:  Normal  Concentration:  Concentration: Fair and Attention Span: Fair  Recall:  [employer] of Knowledge: Fair  Language: Fair  Akathisia:  No  Handed:  Right  AIMS (if indicated): 0  Assets:  Communication Skills Desire for Improvement Social Support  ADL's:  Intact  Cognition: WNL  Sleep:  Fair   Screenings:   Assessment and Plan: Jonalyn is a 52 year old Caucasian female who has a history of depression, PTSD, chronic pain, migraine headaches, history of CVA, iron deficiency, prothrombin gene mutation, presented to the clinic today for a follow-up visit.  Patient is currently  compliant with medications and reports her mood symptoms are stable.  Will continue plan as noted below.  Plan MDD Continue Cymbalta 90 mg p.o. daily Lamictal 125 mg p.o. daily Abilify 5 mg p.o. daily Wellbutrin XL 150 mg p.o. daily.  PTSD Cymbalta 90 mg p.o. daily. Continue psychotherapy.  For insomnia Trazodone 50 mg p.o. Nightly.  Pt to continue psychotherapy with Ms. Peacock.  Follow-up in clinic in 2 months or sooner if needed.  More than 50 % of the time was spent for psychoeducation and supportive psychotherapy and care coordination.  This note was generated in part or whole with voice recognition software. Voice recognition is usually quite accurate but there are transcription errors that can and very often do occur. I apologize for any typographical errors that were not detected and corrected.  Ursula Alert, MD 03/30/2018, 8:33 AM

## 2018-03-29 NOTE — Telephone Encounter (Signed)
received fax that a pa was needed for duloxetine hcl

## 2018-03-30 ENCOUNTER — Encounter: Payer: Self-pay | Admitting: Psychiatry

## 2018-03-30 NOTE — Telephone Encounter (Signed)
duloxetine cap 30mg  was approved through 12--31-2020

## 2018-04-19 ENCOUNTER — Ambulatory Visit (INDEPENDENT_AMBULATORY_CARE_PROVIDER_SITE_OTHER): Payer: Medicare Other | Admitting: Licensed Clinical Social Worker

## 2018-04-19 DIAGNOSIS — F331 Major depressive disorder, recurrent, moderate: Secondary | ICD-10-CM | POA: Diagnosis not present

## 2018-05-11 ENCOUNTER — Ambulatory Visit: Payer: Medicare Other | Admitting: Licensed Clinical Social Worker

## 2018-05-12 ENCOUNTER — Other Ambulatory Visit: Payer: Self-pay | Admitting: Psychiatry

## 2018-05-12 DIAGNOSIS — F331 Major depressive disorder, recurrent, moderate: Secondary | ICD-10-CM

## 2018-05-12 DIAGNOSIS — F4310 Post-traumatic stress disorder, unspecified: Secondary | ICD-10-CM

## 2018-05-29 ENCOUNTER — Other Ambulatory Visit: Payer: Self-pay

## 2018-05-29 ENCOUNTER — Encounter: Payer: Self-pay | Admitting: Psychiatry

## 2018-05-29 ENCOUNTER — Ambulatory Visit (INDEPENDENT_AMBULATORY_CARE_PROVIDER_SITE_OTHER): Payer: Medicare Other | Admitting: Psychiatry

## 2018-05-29 VITALS — BP 137/93 | HR 102 | Ht 66.0 in | Wt 231.8 lb

## 2018-05-29 DIAGNOSIS — F5105 Insomnia due to other mental disorder: Secondary | ICD-10-CM | POA: Diagnosis not present

## 2018-05-29 DIAGNOSIS — F331 Major depressive disorder, recurrent, moderate: Secondary | ICD-10-CM | POA: Diagnosis not present

## 2018-05-29 DIAGNOSIS — F4310 Post-traumatic stress disorder, unspecified: Secondary | ICD-10-CM | POA: Diagnosis not present

## 2018-05-29 NOTE — Progress Notes (Signed)
[facility] MD OP Progress Note  05/29/2018 1:13 PM Christine Mcneil  MRN:  [id number]  Chief Complaint:  Chief Complaint    Follow-up    ' I am here for follow up.' HPI: Christine Mcneil is a 53 year old Caucasian female, divorced, lives in [place], on [place], has a history of depression, anger issues, PTSD, sleep problems, OSA-resolved, history of CVA, history of prothrombin gene mutation, iron deficiency, chronic pain, migraine headaches, presented to the clinic today for a follow-up visit.  Patient today reports she had a good holiday with her family.  Patient reports her mood symptoms continues to improve on the current medication regimen.  She denies any significant depression or anxiety symptoms and reports her symptoms are better.  Patient continues to report sleep is improving.  She is compliant on her medications as prescribed.  Patient denies any side effects to her medications.  Patient's blood pressure seems to be elevated today.  Discussed the same with patient.  Will refer her back to her primary medical doctor.  Patient denies any suicidality, perceptual disturbances or homicidality.   Visit Diagnosis:    ICD-10-CM   1. MDD (major depressive disorder), recurrent episode, moderate (HCC) F33.1    improving  2. PTSD (post-traumatic stress disorder) F43.10   3. Insomnia due to mental disorder F51.05     Past Psychiatric History: Reviewed past psychiatric history from my progress note on 08/16/2017.  Past trials of Effexor, Klonopin  Past Medical History:  Past Medical History:  Diagnosis Date  . Abdominal pain   . Anxiety and depression   . Atypical facial pain   . Bilateral occipital neuralgia   . Cervical spondylosis without myelopathy   . Chronic daily headache   . Chronic pain   . Chronic prostatitis   . Chronic thoracic back pain   . Depression   . Diabetes mellitus without complication ([place])   . Diabetes mellitus, type II ([place])   . Dysphagia   . Dysrhythmia   . Fibromyalgia    . Hyperlipidemia   . Hypertension   . Insomnia   . Iron deficiency anemia   . Left hip pain   . Low back pain   . Migraines   . Numbness   . Optic neuropathy   . OSA (obstructive sleep apnea)   . Polyarthralgia   . Primary osteoarthritis of both hips   . Prothrombin gene mutation ([place])   . Pseudotumor cerebri   . Rectal bleeding   . Right shoulder pain   . Rotator cuff syndrome   . Stroke ([place])   . Thyroid disease   . TIA (transient ischemic attack) 05/27/2017    Past Surgical History:  Procedure Laterality Date  . ABDOMINAL HYSTERECTOMY     total  . CHOLECYSTECTOMY    . COLONOSCOPY WITH PROPOFOL N/A 03/21/2018   Procedure: COLONOSCOPY WITH PROPOFOL;  Surgeon: Lucilla Lame, MD;  Location[facility]COPY;  Service: Endoscopy;  Laterality: N/A;  . ESOPHAGOGASTRODUODENOSCOPY (EGD) WITH PROPOFOL N/A 03/21/2018   Procedure: ESOPHAGOGASTRODUODENOSCOPY (EGD) WITH PROPOFOL;  Surgeon: Lucilla Lame, MD;  Location: [facility] ENDOSCOPY;  Service: Endoscopy;  Laterality: N/A;  . JOINT REPLACEMENT    . KNEE SURGERY Left   . ROTATOR CUFF REPAIR Right   . spinal neurostimulator      Family Psychiatric History: I have reviewed family psychiatric history from my progress note on 08/16/2017.  Family History:  Family History  Problem Relation Age of Onset  . Diabetes Mother   . Hyperlipidemia Mother   .  Hypertension Mother   . COPD Mother   . CVA Mother   . Anemia Mother   . Diabetes Father   . Hyperlipidemia Father   . Hypertension Father   . Thyroid disease Father   . Alcohol abuse Father   . Peripheral vascular disease Sister   . Hypertension Sister   . Anxiety disorder Son   . Depression Son   . Bipolar disorder Son   . Seizures Son     Social History: I have reviewed social history from my progress note on 08/16/2017 Social History   Socioeconomic History  . Marital status: Divorced    Spouse name: Not on file  . Number of children: 1  . Years of education: Not on file   . Highest education level: High school graduate  Occupational History    Comment: disablitiy  Social Needs  . Financial resource strain: Not hard at all  . Food insecurity:    Worry: Never true    Inability: Never true  . Transportation needs:    Medical: No    Non-medical: No  Tobacco Use  . Smoking status: Never Smoker  . Smokeless tobacco: Never Used  Substance and Sexual Activity  . Alcohol use: No  . Drug use: No  . Sexual activity: Not Currently  Lifestyle  . Physical activity:    Days per week: 0 days    Minutes per session: 0 min  . Stress: Very much  Relationships  . Social connections:    Talks on phone: Once a week    Gets together: Never    Attends religious service: More than 4 times per year    Active member of club or organization: No    Attends meetings of clubs or organizations: Never    Relationship status: Divorced  Other Topics Concern  . Not on file  Social History Narrative  . Not on file    Allergies:  Allergies  Allergen Reactions  . Amoxicillin Itching  . Amoxapine Itching  . Dapagliflozin     Other reaction(s): Other (See Comments) Thrush & vaginal itching  . Keflex [Cephalexin] Itching    Metabolic Disorder Labs: Lab Results  Component Value Date   HGBA1C 9.9 (H) 06/21/2017   MPG 237.43 06/21/2017   Lab Results  Component Value Date   PROLACTIN 5.5 09/06/2017   Lab Results  Component Value Date   CHOL 277 (H) 06/21/2017   TRIG 196 (H) 06/21/2017   HDL 64 06/21/2017   CHOLHDL 4.3 06/21/2017   VLDL 39 06/21/2017   LDLCALC 174 (H) 06/21/2017   Lab Results  Component Value Date   TSH 2.890 09/06/2017   TSH 3.176 01/04/2017    Therapeutic Level Labs: No results found for: LITHIUM No results found for: VALPROATE No components found for:  CBMZ  Current Medications: Current Outpatient Medications  Medication Sig Dispense Refill  . ACCU-CHEK AVIVA PLUS test strip     . ACCU-CHEK SOFTCLIX LANCETS lancets     .  acetaZOLAMIDE (DIAMOX) 125 MG tablet     . AIMOVIG 70 MG/ML SOAJ Inject 70 mg into the skin every 28 (twenty-eight) days.    . ARIPiprazole (ABILIFY) 5 MG tablet TAKE ONE TABLET BY MOUTH DAILY. 90 tablet 0  . aspirin-acetaminophen-caffeine (EXCEDRIN MIGRAINE) 250-250-65 MG tablet Take 2 tablets by mouth every 8 (eight) hours as needed for headache or migraine.     Marland Kitchen atorvastatin (LIPITOR) 80 MG tablet Take 80 mg by mouth daily at 6 PM.    .  Blood Glucose Monitoring Suppl (ACCU-CHEK AVIVA PLUS) w/Device KIT     . buPROPion (WELLBUTRIN XL) 150 MG 24 hr tablet Take 1 tablet (150 mg total) by mouth daily. 90 tablet 0  . Carboxymethylcellulose Sod PF (REFRESH PLUS) 0.5 % SOLN Apply to eye.    . Cholecalciferol (VITAMIN D3) 1000 units CAPS Take 1 capsule by mouth daily.    . clonazePAM (KLONOPIN) 0.5 MG tablet May use up to once daily as needed for facial pain    . clotrimazole (LOTRIMIN) 1 % cream     . cyclobenzaprine (FLEXERIL) 5 MG tablet     . dabigatran (PRADAXA) 150 MG CAPS capsule Take by mouth.    . diclofenac (FLECTOR) 1.3 % PTCH     . diltiazem (CARDIZEM CD) 240 MG 24 hr capsule Take by mouth. Take 1 capsule by mouth once daily    . DULoxetine (CYMBALTA) 30 MG capsule Take 3 capsules (90 mg total) by mouth daily. 270 capsule 0  . esomeprazole (NEXIUM) 40 MG capsule Take 40 mg by mouth daily.    . famotidine (PEPCID) 40 MG tablet     . ferrous sulfate 325 (65 FE) MG tablet Take by mouth. Take 325 mg daily with breakfast    . fluticasone (FLONASE) 50 MCG/ACT nasal spray     . glipiZIDE (GLUCOTROL) 5 MG tablet Take 1 tablet (5 mg total) by mouth 2 (two) times daily before a meal. 60 tablet 0  . hydrOXYzine (ATARAX/VISTARIL) 50 MG tablet Take 1 tablet (50 mg total) by mouth 3 (three) times daily as needed for anxiety. 270 tablet 0  . hydrOXYzine (ATARAX/VISTARIL) 50 MG tablet TAKE (1/2) TABLET BY MOUTH TWICE DAILY AS NEEDED FOR ANXIETY    . ketoconazole (NIZORAL) 2 % cream 1 application.     Marland Kitchen lamoTRIgine (LAMICTAL) 100 MG tablet To be combined with 25 mg 90 tablet 0  . lamoTRIgine (LAMICTAL) 25 MG tablet Take 1 tablet (25 mg total) by mouth daily. To be combined with 100 mg 90 tablet 0  . levothyroxine (SYNTHROID, LEVOTHROID) 100 MCG tablet Take 100 mcg by mouth daily.    Marland Kitchen loratadine (CLARITIN) 10 MG tablet Take 10 mg by mouth daily as needed for allergies.    Marland Kitchen MAGNESIUM PO Take 500 mg by mouth 2 (two) times daily.    . metFORMIN (GLUCOPHAGE) 500 MG tablet Take 500 mg by mouth 2 (two) times daily.    . metFORMIN (GLUCOPHAGE-XR) 500 MG 24 hr tablet Take 500 mg by mouth 2 (two) times daily.     . naloxone (NARCAN) nasal spray 4 mg/0.1 mL 1 spray into nose for opioid overdose. If needed, may repeat spray in 2-3 min    . NARCAN 4 MG/0.1ML LIQD nasal spray kit     . NUCYNTA ER 100 MG 12 hr tablet Take 1 tablet by mouth 2 (two) times daily.    . ondansetron (ZOFRAN-ODT) 4 MG disintegrating tablet Take 4 mg by mouth every 8 (eight) hours as needed for nausea or vomiting.    . pioglitazone (ACTOS) 15 MG tablet Take by mouth. Take 1 tablet by mouth once daily    . promethazine (PHENERGAN) 25 MG tablet Take 25 mg by mouth every 8 (eight) hours as needed for nausea or vomiting.    . propranolol (INDERAL) 10 MG tablet Take 1 tablet by mouth 3 (three) times daily.    . quinapril (ACCUPRIL) 10 MG tablet Take 10 mg by mouth daily.    . ranitidine (  ZANTAC) 150 MG tablet Take 150 mg by mouth 2 (two) times daily.    . rizatriptan (MAXALT) 10 MG tablet Take 1 tab at migraine onset. May repeat in 2 hours prn.  Limit to 2 in 24 hours and to 2 days weekly.    . sitaGLIPtin (JANUVIA) 100 MG tablet Take 100 mg by mouth daily.    . traZODone (DESYREL) 50 MG tablet Take 1 tablet (50 mg total) by mouth at bedtime as needed. pls do not combine with Ambien 90 tablet 0  . verapamil (CALAN-SR) 240 MG CR tablet Take 1 tablet by mouth daily.    . dabigatran (PRADAXA) 150 MG CAPS capsule Take 150 mg by mouth 2  (two) times daily.    . metoCLOPramide (REGLAN) 10 MG tablet Take 1 tablet (10 mg total) by mouth every 8 (eight) hours as needed for up to 3 days for nausea. 20 tablet 0   No current facility-administered medications for this visit.      Musculoskeletal: Strength & Muscle Tone: within normal limits Gait & Station: normal Patient leans: N/A  Psychiatric Specialty Exam: Review of Systems  Psychiatric/Behavioral: Negative for depression.  All other systems reviewed and are negative.   Blood pressure (!) 137/93, pulse (!) 102, height 5' 6"  (1.676 m), weight 231 lb 12.8 oz (105.1 kg).Body mass index is 37.41 kg/m.  General Appearance: Casual  Eye Contact:  Fair  Speech:  Normal Rate  Volume:  Normal  Mood:  Euthymic  Affect:  Congruent  Thought Process:  Goal Directed and Descriptions of Associations: Intact  Orientation:  Full (Time, Place, and Person)  Thought Content: Logical   Suicidal Thoughts:  No  Homicidal Thoughts:  No  Memory:  Immediate;   Fair Recent;   Fair Remote;   Fair  Judgement:  Fair  Insight:  Fair  Psychomotor Activity:  Normal  Concentration:  Concentration: Fair and Attention Span: Fair  Recall:  [employer] of Knowledge: Fair  Language: Fair  Akathisia:  No  Handed:  Right  AIMS (if indicated): denies tremors, rigidity,stiffness  Assets:  Communication Skills Desire for Improvement Social Support  ADL's:  Intact  Cognition: WNL  Sleep:  Fair   Screenings:   Assessment and Plan: Coral is a 53 year old Caucasian female who has a history of depression, PTSD, chronic pain, migraine headaches, history of CVA, iron deficiency, prothrombin gene mutation, presented to the clinic today for a follow-up visit.  Patient is making progress on the current medication regimen.  Will continue plan as noted below.  Plan MDD Cymbalta 90 mg p.o. daily Lamictal 125 mg p.o. daily Abilify 5 mg p.o. daily Wellbutrin XL 150 mg p.o. daily  PTSD Cymbalta 90 mg  p.o. daily Continue psychotherapy  For insomnia Trazodone 50 mg p.o. nightly  Continue psychotherapy with Ms. Peacock.  Elevated blood pressure reading Will refer patient back to her primary medical doctor for management.  Follow-up in clinic in 2 to 3 months or sooner if needed.  I have spent atleast 15 minutes face to face with patient today. More than 50 % of the time was spent for psychoeducation and supportive psychotherapy and care coordination.  This note was generated in part or whole with voice recognition software. Voice recognition is usually quite accurate but there are transcription errors that can and very often do occur. I apologize for any typographical errors that were not detected and corrected.           Ursula Alert, MD  05/29/2018, 1:13 PM

## 2018-06-07 ENCOUNTER — Inpatient Hospital Stay: Payer: Medicare Other | Attending: Oncology

## 2018-06-07 DIAGNOSIS — D509 Iron deficiency anemia, unspecified: Secondary | ICD-10-CM

## 2018-06-07 LAB — CBC WITH DIFFERENTIAL/PLATELET
Abs Immature Granulocytes: 0.03 10*3/uL (ref 0.00–0.07)
Basophils Absolute: 0 10*3/uL (ref 0.0–0.1)
Basophils Relative: 0 %
Eosinophils Absolute: 0.2 10*3/uL (ref 0.0–0.5)
Eosinophils Relative: 2 %
HEMATOCRIT: 44.1 % (ref 36.0–46.0)
Hemoglobin: 14.3 g/dL (ref 12.0–15.0)
Immature Granulocytes: 0 %
Lymphocytes Relative: 32 %
Lymphs Abs: 2.3 10*3/uL (ref 0.7–4.0)
MCH: 29.1 pg (ref 26.0–34.0)
MCHC: 32.4 g/dL (ref 30.0–36.0)
MCV: 89.6 fL (ref 80.0–100.0)
Monocytes Absolute: 0.5 10*3/uL (ref 0.1–1.0)
Monocytes Relative: 7 %
Neutro Abs: 4.1 10*3/uL (ref 1.7–7.7)
Neutrophils Relative %: 59 %
Platelets: 219 10*3/uL (ref 150–400)
RBC: 4.92 MIL/uL (ref 3.87–5.11)
RDW: 13.5 % (ref 11.5–15.5)
WBC: 7.2 10*3/uL (ref 4.0–10.5)
nRBC: 0 % (ref 0.0–0.2)

## 2018-06-07 LAB — IRON AND TIBC
Iron: 86 ug/dL (ref 28–170)
SATURATION RATIOS: 23 % (ref 10.4–31.8)
TIBC: 377 ug/dL (ref 250–450)
UIBC: 291 ug/dL

## 2018-06-07 LAB — FERRITIN: Ferritin: 53 ng/mL (ref 11–307)

## 2018-06-08 ENCOUNTER — Encounter: Payer: Self-pay | Admitting: Hematology and Oncology

## 2018-06-08 ENCOUNTER — Inpatient Hospital Stay (HOSPITAL_BASED_OUTPATIENT_CLINIC_OR_DEPARTMENT_OTHER): Payer: Medicare Other | Admitting: Hematology and Oncology

## 2018-06-08 ENCOUNTER — Inpatient Hospital Stay: Payer: Medicare Other

## 2018-06-08 ENCOUNTER — Ambulatory Visit: Payer: Medicare Other | Admitting: Oncology

## 2018-06-08 VITALS — BP 126/88 | HR 89 | Temp 97.1°F | Resp 18 | Ht 66.0 in | Wt 232.6 lb

## 2018-06-08 DIAGNOSIS — D6852 Prothrombin gene mutation: Secondary | ICD-10-CM

## 2018-06-08 DIAGNOSIS — E119 Type 2 diabetes mellitus without complications: Secondary | ICD-10-CM

## 2018-06-08 DIAGNOSIS — D509 Iron deficiency anemia, unspecified: Secondary | ICD-10-CM

## 2018-06-08 LAB — URINALYSIS, COMPLETE (UACMP) WITH MICROSCOPIC
Bacteria, UA: NONE SEEN
Bilirubin Urine: NEGATIVE
Glucose, UA: NEGATIVE mg/dL
Hgb urine dipstick: NEGATIVE
Ketones, ur: NEGATIVE mg/dL
Leukocytes, UA: NEGATIVE
Nitrite: NEGATIVE
Protein, ur: NEGATIVE mg/dL
Specific Gravity, Urine: >1.03 — ABNORMAL HIGH (ref 1.005–1.030)
pH: 6 (ref 5.0–8.0)

## 2018-06-08 NOTE — Progress Notes (Signed)
[facility]     [address], [address]     [place], [place] [address]     Phone: [phone]      Fax: [phone]       Clinic day:  06/08/2018  Chief Complaint: Christine Mcneil is a 53 y.o. female with iron deficiency anemia who is seen for new patient assessment.  HPI:  The patient was initially seen by Dr. Tasia Catchings on 01/12/2018.  Patient had a history of chronic iron deficiency and was taking oral iron.  She denied any, hematochezia, hematuria or vaginal bleeding.  Fecal occult testing positive.  She notes some minor hemorrhoidal bleeding.  She had been referred to GI.  Symptomatically, she was fatigued.  Hemoglobin was 11.8, MCV 73.1.  Ferritin was 10 with iron saturation of 55% and a TIBC of 576.  Folate was normal.  B12 was high.  She underwent a work-up.  BC revealed a hematocrit of 38.2, hemoglobin 11.8, MCV 73.1, platelets 320,000, and white count 8100.  Reticulocyte count was 1.4%.  Ferritin was 10.  Iron studies included a saturation of 55% and a TIBC of 576.  B12 was 1927.  Folate was 18.5.  Intrinsic factor and anti-parietal antibodies were negative.  CMP revealed a total protein of 8.5 (6.5-8.1).  SPEP on 01/18/2018 revealed no monoclonal protein.  She received Alroy Bailiff for weekly x4.  She was last seen by Dr. Tasia Catchings on 03/09/2018.  He noted that her fatigue was better after the IV iron.  CBC on 03/07/2018 revealed a hematocrit of 41.8, hemoglobin 13.1, MCV 83.8, platelets 229,000 and white count 6900.  Ferritin was 48.  She was felt to benefit from additional dose of IV iron and thus received Venofer 200 mg x 1  EGD by Dr Allen Norris on 03/21/2018 was normal.  Colonoscopy on 03/21/2018 revealed for 1 to 2 mm polyps in the sigmoid colon and internal hemorrhoids.  Pathology revealed hyperplastic polyps.  CBC on 06/07/2018 revealed a hematocrit of 44.1, hemoglobin 14.3, and MCV 89.6.  Iron saturation was 23% and TIBC 377.  Regarding her diet, she states that she eats  meat 3 times a week.  She does not eat green leafy vegetables.  She previously noted ice pica which has resolved.  She has history of prothrombin gene mutation.  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  She is on long term anticoagulation with Pradaxa.   Symptomatically, she feels "pretty good".  She denies any melena, hematochezia, hematuria or vaginal bleeding.    She states that her mother had iron deficiency requiring iron infusions.  She had a "mass" in her small intestine.   Past Medical History:  Diagnosis Date  . Abdominal pain   . Anxiety and depression   . Atypical facial pain   . Bilateral occipital neuralgia   . Cervical spondylosis without myelopathy   . Chronic daily headache   . Chronic pain   . Chronic prostatitis   . Chronic thoracic back pain   . Depression   . Diabetes mellitus without complication ([place])   . Diabetes mellitus, type II ([place])   . Dysphagia   . Dysrhythmia   . Fibromyalgia   . Hyperlipidemia   . Hypertension   . Insomnia   . Iron deficiency anemia   . Left hip pain   . Low back pain   . Migraines   . Numbness   . Optic neuropathy   . OSA (obstructive sleep  apnea)   . Polyarthralgia   . Primary osteoarthritis of both hips   . Prothrombin gene mutation ([place])   . Pseudotumor cerebri   . Rectal bleeding   . Right shoulder pain   . Rotator cuff syndrome   . Stroke ([place])   . Thyroid disease   . TIA (transient ischemic attack) 05/27/2017    Past Surgical History:  Procedure Laterality Date  . ABDOMINAL HYSTERECTOMY     total  . CHOLECYSTECTOMY    . COLONOSCOPY WITH PROPOFOL N/A 03/21/2018   Procedure: COLONOSCOPY WITH PROPOFOL;  Surgeon: Lucilla Lame, MD;  Location: [facility] ENDOSCOPY;  Service: Endoscopy;  Laterality: N/A;  . ESOPHAGOGASTRODUODENOSCOPY (EGD) WITH PROPOFOL N/A 03/21/2018   Procedure: ESOPHAGOGASTRODUODENOSCOPY (EGD) WITH PROPOFOL;  Surgeon: Lucilla Lame, MD;  Location: [facility] ENDOSCOPY;  Service: Endoscopy;   Laterality: N/A;  . JOINT REPLACEMENT    . KNEE SURGERY Left   . ROTATOR CUFF REPAIR Right   . spinal neurostimulator      Family History  Problem Relation Age of Onset  . Diabetes Mother   . Hyperlipidemia Mother   . Hypertension Mother   . COPD Mother   . CVA Mother   . Anemia Mother   . Diabetes Father   . Hyperlipidemia Father   . Hypertension Father   . Thyroid disease Father   . Alcohol abuse Father   . Peripheral vascular disease Sister   . Hypertension Sister   . Anxiety disorder Son   . Depression Son   . Bipolar disorder Son   . Seizures Son     Social History:  reports that she has never smoked. She has never used smokeless tobacco. She reports that she does not drink alcohol or use drugs.  She has been on disability since 2013 to difficulty with ambulation and fibromyalgia.  She lives in [place].  The patient is alone today.  Allergies:  Allergies  Allergen Reactions  . Amoxicillin Itching  . Amoxapine Itching  . Dapagliflozin     Other reaction(s): Other (See Comments) Thrush & vaginal itching  . Keflex [Cephalexin] Itching    Current Medications: Current Outpatient Medications  Medication Sig Dispense Refill  . acetaZOLAMIDE (DIAMOX) 125 MG tablet     . AIMOVIG 70 MG/ML SOAJ Inject 70 mg into the skin every 28 (twenty-eight) days.    Marland Kitchen aspirin-acetaminophen-caffeine (EXCEDRIN MIGRAINE) 250-250-65 MG tablet Take 2 tablets by mouth every 8 (eight) hours as needed for headache or migraine.     Marland Kitchen atorvastatin (LIPITOR) 80 MG tablet Take 80 mg by mouth daily at 6 PM.    . Carboxymethylcellulose Sod PF (REFRESH PLUS) 0.5 % SOLN Apply to eye.    . Cholecalciferol (VITAMIN D3) 1000 units CAPS Take 1 capsule by mouth daily.    . cyclobenzaprine (FLEXERIL) 5 MG tablet     . dabigatran (PRADAXA) 150 MG CAPS capsule Take 150 mg by mouth 2 (two) times daily.    . diclofenac (FLECTOR) 1.3 % PTCH     . diltiazem (CARDIZEM CD) 240 MG 24 hr capsule Take by mouth. Take  1 capsule by mouth once daily    . esomeprazole (NEXIUM) 40 MG capsule Take 40 mg by mouth daily.    . famotidine (PEPCID) 40 MG tablet     . ferrous sulfate 325 (65 FE) MG tablet Take by mouth. Take 325 mg daily with breakfast    . fluticasone (FLONASE) 50 MCG/ACT nasal spray     . levothyroxine (SYNTHROID, LEVOTHROID) 100  MCG tablet Take 100 mcg by mouth daily.    Marland Kitchen MAGNESIUM PO Take 500 mg by mouth 2 (two) times daily.    . metoprolol succinate (TOPROL-XL) 25 MG 24 hr tablet Take by mouth.    Gean Birchwood ER 100 MG 12 hr tablet Take 1 tablet by mouth 2 (two) times daily.    . pioglitazone (ACTOS) 15 MG tablet Take by mouth. Take 1 tablet by mouth once daily    . quinapril (ACCUPRIL) 10 MG tablet Take 10 mg by mouth daily.    . sitaGLIPtin (JANUVIA) 100 MG tablet Take 100 mg by mouth daily.    Marland Kitchen ACCU-CHEK AVIVA PLUS test strip     . ACCU-CHEK SOFTCLIX LANCETS lancets     . ARIPiprazole (ABILIFY) 5 MG tablet Take 1 tablet (5 mg total) by mouth daily. 90 tablet 0  . Blood Glucose Monitoring Suppl (ACCU-CHEK AVIVA PLUS) w/Device KIT     . buPROPion (WELLBUTRIN XL) 150 MG 24 hr tablet TAKE ONE TABLET BY MOUTH DAILY. 90 tablet 0  . clonazePAM (KLONOPIN) 0.5 MG tablet May use up to once daily as needed for facial pain    . clotrimazole (LOTRIMIN) 1 % cream     . DULoxetine (CYMBALTA) 30 MG capsule Take 3 capsules (90 mg total) by mouth daily. 270 capsule 0  . glipiZIDE (GLUCOTROL) 5 MG tablet Take 1 tablet (5 mg total) by mouth 2 (two) times daily before a meal. 60 tablet 0  . HUMALOG KWIKPEN 100 UNIT/ML KwikPen     . hydrOXYzine (VISTARIL) 25 MG capsule Take 1 capsule (25 mg total) by mouth 2 (two) times daily as needed. Severe anxiety attacks 180 capsule 0  . ketoconazole (NIZORAL) 2 % cream 1 application.    Marland Kitchen lamoTRIgine (LAMICTAL) 100 MG tablet To be combined with 25 mg 90 tablet 0  . lamoTRIgine (LAMICTAL) 25 MG tablet TAKE ONE TABLET BY MOUTH DAILY. TO BE COMBINED WITH 100 MG. 90 tablet 0    . loratadine (CLARITIN) 10 MG tablet Take 10 mg by mouth daily as needed for allergies.    . metFORMIN (GLUCOPHAGE-XR) 500 MG 24 hr tablet Take 500 mg by mouth 2 (two) times daily.     . metoCLOPramide (REGLAN) 10 MG tablet Take 1 tablet (10 mg total) by mouth every 8 (eight) hours as needed for up to 3 days for nausea. 20 tablet 0  . naloxone (NARCAN) nasal spray 4 mg/0.1 mL 1 spray into nose for opioid overdose. If needed, may repeat spray in 2-3 min    . ondansetron (ZOFRAN) 4 MG tablet TAKE (1) TABLET BY MOUTH EVERY 8 HOURS AS NEEDED FOR NAUSEA AND VOMITING    . ondansetron (ZOFRAN-ODT) 4 MG disintegrating tablet Take 4 mg by mouth every 8 (eight) hours as needed for nausea or vomiting.    . ranitidine (ZANTAC) 150 MG tablet Take 150 mg by mouth 2 (two) times daily.    . rizatriptan (MAXALT) 10 MG tablet Take 1 tab at migraine onset. May repeat in 2 hours prn.  Limit to 2 in 24 hours and to 2 days weekly.    . traZODone (DESYREL) 50 MG tablet TAKE ONE TABLET BY MOUTH AT BEDTIME AS NEEDED. *DO NOT COMBINE WITH WITH AMBIEN 90 tablet 0   No current facility-administered medications for this visit.     Review of Systems:  GENERAL:  Feels "pretty good".  No fevers, sweats or weight loss. PERFORMANCE STATUS (ECOG):  1 HEENT:  Runny  nose.  No visual changes, sore throat, mouth sores or tenderness. Lungs: No shortness of breath.  Cough with runny nose.  No hemoptysis. Cardiac:  No chest pain, palpitations, orthopnea, or PND. GI:  No nausea, vomiting, diarrhea, constipation, melena or hematochezia. GU:  No urgency, frequency, dysuria, or hematuria. Musculoskeletal:  Fibromyalgia.  Osteoarthritis in hips.  No muscle tenderness. Extremities:  No pain or swelling. Skin:  Easy bruising on Pradaxa.  No rashes or skin changes. Neuro:  Frequent headaches.  No numbness or weakness, balance or coordination issues. Endocrine:  Diabetes.  Thyroid disease on Synthroid.  No hot flashes or night  sweats. Psych:  No mood changes, depression or anxiety. Pain:  Headache (4 out of 10).  Goes to pain clinic. Review of systems:  All other systems reviewed and found to be negative.  Physical Exam: Blood pressure 126/88, pulse 89, temperature (!) 97.1 F (36.2 C), temperature source Tympanic, resp. rate 18, height 5' 6"  (1.676 m), weight 232 lb 9.4 oz (105.5 kg), SpO2 97 %. GENERAL:  Well developed, well nourished, woman sitting comfortably in the exam room in no acute distress. MENTAL STATUS:  Alert and oriented to person, place and time. HEAD:  Pearline Cables hair pulled back.  Normocephalic, atraumatic, face full and symmetric, no Cushingoid features. EYES:  Blue eyes.  Pupils equal round and reactive to light and accomodation.  No conjunctivitis or scleral icterus. ENT:  Oropharynx clear without lesion.  Tongue normal. Mucous membranes moist.  RESPIRATORY:  Clear to auscultation without rales, wheezes or rhonchi. CARDIOVASCULAR:  Regular rate and rhythm without murmur, rub or gallop. ABDOMEN:  Striae.  Soft, non-tender, with active bowel sounds, and no appreciable hepatosplenomegaly.  No masses. SKIN:  No rashes, ulcers or lesions. EXTREMITIES: No edema, no skin discoloration or tenderness.  No palpable cords. LYMPH NODES: No palpable cervical, supraclavicular, axillary or inguinal adenopathy  NEUROLOGICAL: Unremarkable. PSYCH:  Appropriate.   Office Visit on 06/08/2018  Component Date Value Ref Range Status  . Color, Urine 06/08/2018 YELLOW  YELLOW Final  . APPearance 06/08/2018 CLEAR  CLEAR Final  . Specific Gravity, Urine 06/08/2018 >1.030* 1.005 - 1.030 Final  . pH 06/08/2018 6.0  5.0 - 8.0 Final  . Glucose, UA 06/08/2018 NEGATIVE  NEGATIVE mg/dL Final  . Hgb urine dipstick 06/08/2018 NEGATIVE  NEGATIVE Final  . Bilirubin Urine 06/08/2018 NEGATIVE  NEGATIVE Final  . Ketones, ur 06/08/2018 NEGATIVE  NEGATIVE mg/dL Final  . Protein, ur 06/08/2018 NEGATIVE  NEGATIVE mg/dL Final  .  Nitrite 06/08/2018 NEGATIVE  NEGATIVE Final  . Leukocytes, UA 06/08/2018 NEGATIVE  NEGATIVE Final  . Squamous Epithelial / LPF 06/08/2018 0-5  0 - 5 Final  . WBC, UA 06/08/2018 0-5  0 - 5 WBC/hpf Final  . RBC / HPF 06/08/2018 0-5  0 - 5 RBC/hpf Final  . Bacteria, UA 06/08/2018 NONE SEEN  NONE SEEN Final  . Mucus 06/08/2018 PRESENT   Final   Performed at [facility], [address], [place], [place] [address]  Appointment on 06/07/2018  Component Date Value Ref Range Status  . Iron 06/07/2018 86  28 - 170 ug/dL Final  . TIBC 06/07/2018 377  250 - 450 ug/dL Final  . Saturation Ratios 06/07/2018 23  10.4 - 31.8 % Final  . UIBC 06/07/2018 291  ug/dL Final   Performed at [facility], [address], [place], [place] [address]  . Ferritin 06/07/2018 53  11 - 307 ng/mL Final   Performed at [facility], [address] [place], [place] [address]  . WBC 06/07/2018 7.2  4.0 - 10.5 K/uL Final  . RBC 06/07/2018 4.92  3.87 - 5.11 MIL/uL Final  . Hemoglobin 06/07/2018 14.3  12.0 - 15.0 g/dL Final  . HCT 06/07/2018 44.1  36.0 - 46.0 % Final  . MCV 06/07/2018 89.6  80.0 - 100.0 fL Final  . MCH 06/07/2018 29.1  26.0 - 34.0 pg Final  . MCHC 06/07/2018 32.4  30.0 - 36.0 g/dL Final  . RDW 06/07/2018 13.5  11.5 - 15.5 % Final  . Platelets 06/07/2018 219  150 - 400 K/uL Final  . nRBC 06/07/2018 0.0  0.0 - 0.2 % Final  . Neutrophils Relative % 06/07/2018 59  % Final  . Neutro Abs 06/07/2018 4.1  1.7 - 7.7 K/uL Final  . Lymphocytes Relative 06/07/2018 32  % Final  . Lymphs Abs 06/07/2018 2.3  0.7 - 4.0 K/uL Final  . Monocytes Relative 06/07/2018 7  % Final  . Monocytes Absolute 06/07/2018 0.5  0.1 - 1.0 K/uL Final  . Eosinophils Relative 06/07/2018 2  % Final  . Eosinophils Absolute 06/07/2018 0.2  0.0 - 0.5 K/uL Final  . Basophils Relative 06/07/2018 0  % Final  . Basophils Absolute 06/07/2018 0.0  0.0 - 0.1 K/uL Final  . Immature Granulocytes 06/07/2018 0  % Final   . Abs Immature Granulocytes 06/07/2018 0.03  0.00 - 0.07 K/uL Final   Performed at [facility], [address], [place], [place] [address]    Assessment:  DETRIA CUMMINGS is a 53 y.o. female with iron deficiency anemia of unclear etiology.  She denies any melena, hematochezia, hematuria or melena.  Diet is fairly good.  Work-up on 01/12/2018 revealed a hematocrit of 38.2, hemoglobin 11.8, MCV 73.1, platelets 320,000, and white count 8100.  Reticulocyte count was 1.4%.  Ferritin was 10.  Iron studies included a saturation of 55% and a TIBC of 576.  Normal studies included:  B12, folate, SPEP, intrinsic factor antibodies, and anti-parietal antibodies.  She received Venofer weekly x 4 (01/20/2018 - 02/09/2018) and 03/14/2018.  Ferritin has been followed:  10 on 01/12/2018, 7 on 01/18/2018, 48 on 03/07/2018, and 53 on 06/07/2018  EGD on 03/21/2018 was normal.  Colonoscopy on 03/21/2018 revealed for 1 to 2 mm polyps in the sigmoid colon and internal hemorrhoids.  Pathology revealed hyperplastic polyps.  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  She has a prothrombin gene mutation.  She is on long term anticoagulation with Pradaxa.   Symptomatically, she feels "pretty good".  Ice pica has resolved.  Exam is unremarkable.  Hemoglobin is 14.3.  Ferritin is 53.  Plan: 1.   Review entire medical history, diagnosis and management of iron deficiency anemia. 2.   Iron deficiency anemia  Patient is no longer anemic.  MCV is normal.  Iron stores (ferritin) are adequate.  Etiology of iron deficiency unclear.    Check urinalysis to r/o microscopic hematuria.  Consider capsule study.  No Venofer today. 3.   RTC in 3 months for MD assessment, labs (CBC with diff, ferritin- day before), and +/- Venofer  I discussed the assessment and treatment plan with the patient.  The patient was provided an opportunity to ask questions and all were answered.  The patient agreed with the  plan and demonstrated an understanding of the instructions.  The patient was advised to call back or seek an in person evaluation if the  symptoms worsen or if the condition fails to improve as anticipated.  I provided 25 minutes of face-to-face visit time during this this encounter and > 50% was spent counseling as documented under my assessment and plan.   Lequita Asal, MD  06/08/2018, 4:35 PM

## 2018-06-08 NOTE — Progress Notes (Signed)
   THERAPIST PROGRESS NOTE  Session Time: 20min  Participation Level: Active  Behavioral Response: CasualAlertEuthymic  Type of Therapy: Individual Therapy  Treatment Goals addressed: Coping  Interventions: Solution Focused  Summary: Christine Mcneil is a 53 y.o. female who presents with a reduction in symptoms.  Therapist initiated today's visit by probing and eliciting information from Patient about her problematic behaviors to determine her progress on the goal. Therapist educated Patient on her diagnosis and the symptoms that she displays. Therapist questioned Patient about her perspective on whether or not she's made progress on her treatment goals. Therapist facilitated a therapeutic activity with Patient to assist her with recognizing and identifying her negative thinking patterns and to assist with increasing her positive thinking skills. Therapist reviewed, explained and summarized with Patient all objectives learned and asked questions for clarification. Therapist reviewed and educated Patient on tools and coping skills to assist Patient with working towards achieving her treatment goals.   Suicidal/Homicidal: No  Plan: Return again in 2 weeks.  Diagnosis: Axis I: Depression    Axis II: No diagnosis    Lubertha South, LCSW 04/19/2018

## 2018-06-28 ENCOUNTER — Other Ambulatory Visit: Payer: Self-pay | Admitting: Psychiatry

## 2018-06-28 DIAGNOSIS — F331 Major depressive disorder, recurrent, moderate: Secondary | ICD-10-CM

## 2018-06-28 DIAGNOSIS — F4310 Post-traumatic stress disorder, unspecified: Secondary | ICD-10-CM

## 2018-07-12 ENCOUNTER — Ambulatory Visit (INDEPENDENT_AMBULATORY_CARE_PROVIDER_SITE_OTHER): Payer: Medicare Other | Admitting: Licensed Clinical Social Worker

## 2018-07-12 DIAGNOSIS — F331 Major depressive disorder, recurrent, moderate: Secondary | ICD-10-CM

## 2018-07-21 ENCOUNTER — Other Ambulatory Visit: Payer: Self-pay | Admitting: Psychiatry

## 2018-07-21 DIAGNOSIS — F4310 Post-traumatic stress disorder, unspecified: Secondary | ICD-10-CM

## 2018-07-21 DIAGNOSIS — F331 Major depressive disorder, recurrent, moderate: Secondary | ICD-10-CM

## 2018-08-04 ENCOUNTER — Other Ambulatory Visit: Payer: Self-pay | Admitting: Psychiatry

## 2018-08-04 DIAGNOSIS — F331 Major depressive disorder, recurrent, moderate: Secondary | ICD-10-CM

## 2018-08-15 ENCOUNTER — Other Ambulatory Visit: Payer: Self-pay

## 2018-08-15 ENCOUNTER — Ambulatory Visit (INDEPENDENT_AMBULATORY_CARE_PROVIDER_SITE_OTHER): Payer: Medicare Other | Admitting: Licensed Clinical Social Worker

## 2018-08-15 DIAGNOSIS — F331 Major depressive disorder, recurrent, moderate: Secondary | ICD-10-CM

## 2018-08-25 ENCOUNTER — Other Ambulatory Visit: Payer: Self-pay | Admitting: Psychiatry

## 2018-08-25 DIAGNOSIS — F331 Major depressive disorder, recurrent, moderate: Secondary | ICD-10-CM

## 2018-08-25 DIAGNOSIS — F4310 Post-traumatic stress disorder, unspecified: Secondary | ICD-10-CM

## 2018-08-28 ENCOUNTER — Encounter: Payer: Self-pay | Admitting: Psychiatry

## 2018-08-28 ENCOUNTER — Other Ambulatory Visit: Payer: Self-pay

## 2018-08-28 ENCOUNTER — Ambulatory Visit: Payer: Medicare Other | Admitting: Psychiatry

## 2018-08-28 ENCOUNTER — Ambulatory Visit (INDEPENDENT_AMBULATORY_CARE_PROVIDER_SITE_OTHER): Payer: Medicare Other | Admitting: Psychiatry

## 2018-08-28 DIAGNOSIS — F419 Anxiety disorder, unspecified: Secondary | ICD-10-CM

## 2018-08-28 DIAGNOSIS — F4310 Post-traumatic stress disorder, unspecified: Secondary | ICD-10-CM | POA: Diagnosis not present

## 2018-08-28 DIAGNOSIS — F5105 Insomnia due to other mental disorder: Secondary | ICD-10-CM

## 2018-08-28 DIAGNOSIS — F331 Major depressive disorder, recurrent, moderate: Secondary | ICD-10-CM | POA: Diagnosis not present

## 2018-08-28 MED ORDER — LAMOTRIGINE 100 MG PO TABS: ORAL_TABLET | ORAL | 0 refills | Status: DC

## 2018-08-28 MED ORDER — DULOXETINE HCL 30 MG PO CPEP: 90.0000 mg | ORAL_CAPSULE | Freq: Every day | ORAL | 0 refills | Status: DC

## 2018-08-28 MED ORDER — HYDROXYZINE PAMOATE 25 MG PO CAPS: 25.0000 mg | ORAL_CAPSULE | Freq: Two times a day (BID) | ORAL | 0 refills | Status: DC | PRN

## 2018-08-28 MED ORDER — ARIPIPRAZOLE 5 MG PO TABS: 5.0000 mg | ORAL_TABLET | Freq: Every day | ORAL | 0 refills | Status: DC

## 2018-08-28 NOTE — Progress Notes (Signed)
Virtual Visit via Telephone Note  I connected with Christine Mcneil on 08/28/18 at 10:00 AM EDT by telephone and verified that I am speaking with the correct person using two identifiers.   I discussed the limitations, risks, security and privacy concerns of performing an evaluation and management service by telephone and the availability of in person appointments. I also discussed with the patient that there may be a patient responsible charge related to this service. The patient expressed understanding and agreed to proceed.    I discussed the assessment and treatment plan with the patient. The patient was provided an opportunity to ask questions and all were answered. The patient agreed with the plan and demonstrated an understanding of the instructions.   The patient was advised to call back or seek an in-person evaluation if the symptoms worsen or if the condition fails to improve as anticipated.  I provided 15 minutes of non-face-to-face time during this encounter.   Ursula Alert, MD      Ursula Alert, MD  [facility] MD OP Progress Note  08/28/2018 1:39 PM Christine Mcneil  MRN:  [id number]  Chief Complaint: ' I am here for follow up." Chief Complaint    Follow-up     HPI: Christine Mcneil is a 53 year old Caucasian female, divorced, lives in [place], [place], has a history of depression, PTSD, insomnia, OSA-resolved, history of CVA, history of prothrombin gene mutation, chronic pain, migraine headaches, iron deficiency, was evaluated by phone today.  Patient today reports she is currently anxious about the current COVID-19 outbreak.  She reports she does have some anxiety attacks on and off.  Discussed restarting hydroxyzine as needed again and she agreed with plan.  Patient otherwise is compliant on her Cymbalta, Abilify, Lamictal ,Wellbutrin.  Patient denies any side effects.  Patient reports sleep is interrupted on and off due to her anxiety symptoms.  She however wants to stay on trazodone for  now.  Patient advised to continue psychotherapy sessions with Ms. Peacock.  Patient denies any suicidality, homicidality or perceptual disturbances.  Patient has good social support system from her boyfriend. Visit Diagnosis:    ICD-10-CM   1. MDD (major depressive disorder), recurrent episode, moderate (HCC) F33.1 DULoxetine (CYMBALTA) 30 MG capsule    ARIPiprazole (ABILIFY) 5 MG tablet    lamoTRIgine (LAMICTAL) 100 MG tablet  2. PTSD (post-traumatic stress disorder) F43.10 DULoxetine (CYMBALTA) 30 MG capsule    ARIPiprazole (ABILIFY) 5 MG tablet  3. Insomnia due to mental disorder F51.05   4. Anxiety disorder, unspecified type F41.9 hydrOXYzine (VISTARIL) 25 MG capsule    Past Psychiatric History: Reviewed past psychiatric history from my progress note on 08/16/2017.  Past trials of Effexor, Klonopin  Past Medical History:  Past Medical History:  Diagnosis Date  . Abdominal pain   . Anxiety and depression   . Atypical facial pain   . Bilateral occipital neuralgia   . Cervical spondylosis without myelopathy   . Chronic daily headache   . Chronic pain   . Chronic prostatitis   . Chronic thoracic back pain   . Depression   . Diabetes mellitus without complication ([place])   . Diabetes mellitus, type II ([place])   . Dysphagia   . Dysrhythmia   . Fibromyalgia   . Hyperlipidemia   . Hypertension   . Insomnia   . Iron deficiency anemia   . Left hip pain   . Low back pain   . Migraines   . Numbness   . Optic  neuropathy   . OSA (obstructive sleep apnea)   . Polyarthralgia   . Primary osteoarthritis of both hips   . Prothrombin gene mutation ([place])   . Pseudotumor cerebri   . Rectal bleeding   . Right shoulder pain   . Rotator cuff syndrome   . Stroke ([place])   . Thyroid disease   . TIA (transient ischemic attack) 05/27/2017    Past Surgical History:  Procedure Laterality Date  . ABDOMINAL HYSTERECTOMY     total  . CHOLECYSTECTOMY    . COLONOSCOPY WITH PROPOFOL N/A  03/21/2018   Procedure: COLONOSCOPY WITH PROPOFOL;  Surgeon: Lucilla Lame, MD;  Location: [facility] ENDOSCOPY;  Service: Endoscopy;  Laterality: N/A;  . ESOPHAGOGASTRODUODENOSCOPY (EGD) WITH PROPOFOL N/A 03/21/2018   Procedure: ESOPHAGOGASTRODUODENOSCOPY (EGD) WITH PROPOFOL;  Surgeon: Lucilla Lame, MD;  Location: [facility] ENDOSCOPY;  Service: Endoscopy;  Laterality: N/A;  . JOINT REPLACEMENT    . KNEE SURGERY Left   . ROTATOR CUFF REPAIR Right   . spinal neurostimulator      Family Psychiatric History: I have reviewed family psychiatric history from my progress note on 08/16/2017  Family History:  Family History  Problem Relation Age of Onset  . Diabetes Mother   . Hyperlipidemia Mother   . Hypertension Mother   . COPD Mother   . CVA Mother   . Anemia Mother   . Diabetes Father   . Hyperlipidemia Father   . Hypertension Father   . Thyroid disease Father   . Alcohol abuse Father   . Peripheral vascular disease Sister   . Hypertension Sister   . Anxiety disorder Son   . Depression Son   . Bipolar disorder Son   . Seizures Son     Social History: I have reviewed social history from my progress note on 08/16/2017. Social History   Socioeconomic History  . Marital status: Divorced    Spouse name: Not on file  . Number of children: 1  . Years of education: Not on file  . Highest education level: High school graduate  Occupational History    Comment: disablitiy  Social Needs  . Financial resource strain: Not hard at all  . Food insecurity:    Worry: Never true    Inability: Never true  . Transportation needs:    Medical: No    Non-medical: No  Tobacco Use  . Smoking status: Never Smoker  . Smokeless tobacco: Never Used  Substance and Sexual Activity  . Alcohol use: No  . Drug use: No  . Sexual activity: Not Currently  Lifestyle  . Physical activity:    Days per week: 0 days    Minutes per session: 0 min  . Stress: Very much  Relationships  . Social connections:     Talks on phone: Once a week    Gets together: Never    Attends religious service: More than 4 times per year    Active member of club or organization: No    Attends meetings of clubs or organizations: Never    Relationship status: Divorced  Other Topics Concern  . Not on file  Social History Narrative  . Not on file    Allergies:  Allergies  Allergen Reactions  . Amoxicillin Itching  . Amoxapine Itching  . Dapagliflozin     Other reaction(s): Other (See Comments) Thrush & vaginal itching  . Keflex [Cephalexin] Itching    Metabolic Disorder Labs: Lab Results  Component Value Date   HGBA1C 9.9 (H) 06/21/2017  MPG 237.43 06/21/2017   Lab Results  Component Value Date   PROLACTIN 5.5 09/06/2017   Lab Results  Component Value Date   CHOL 277 (H) 06/21/2017   TRIG 196 (H) 06/21/2017   HDL 64 06/21/2017   CHOLHDL 4.3 06/21/2017   VLDL 39 06/21/2017   LDLCALC 174 (H) 06/21/2017   Lab Results  Component Value Date   TSH 2.890 09/06/2017   TSH 3.176 01/04/2017    Therapeutic Level Labs: No results found for: LITHIUM No results found for: VALPROATE No components found for:  CBMZ  Current Medications: Current Outpatient Medications  Medication Sig Dispense Refill  . ACCU-CHEK AVIVA PLUS test strip     . ACCU-CHEK SOFTCLIX LANCETS lancets     . acetaZOLAMIDE (DIAMOX) 125 MG tablet     . AIMOVIG 70 MG/ML SOAJ Inject 70 mg into the skin every 28 (twenty-eight) days.    . ARIPiprazole (ABILIFY) 5 MG tablet Take 1 tablet (5 mg total) by mouth daily. 90 tablet 0  . aspirin-acetaminophen-caffeine (EXCEDRIN MIGRAINE) 250-250-65 MG tablet Take 2 tablets by mouth every 8 (eight) hours as needed for headache or migraine.     Marland Kitchen atorvastatin (LIPITOR) 80 MG tablet Take 80 mg by mouth daily at 6 PM.    . Blood Glucose Monitoring Suppl (ACCU-CHEK AVIVA PLUS) w/Device KIT     . buPROPion (WELLBUTRIN XL) 150 MG 24 hr tablet TAKE ONE TABLET BY MOUTH DAILY. 90 tablet 0  .  Carboxymethylcellulose Sod PF (REFRESH PLUS) 0.5 % SOLN Apply to eye.    . Cholecalciferol (VITAMIN D3) 1000 units CAPS Take 1 capsule by mouth daily.    . clonazePAM (KLONOPIN) 0.5 MG tablet May use up to once daily as needed for facial pain    . clotrimazole (LOTRIMIN) 1 % cream     . cyclobenzaprine (FLEXERIL) 5 MG tablet     . diclofenac (FLECTOR) 1.3 % PTCH     . diltiazem (CARDIZEM CD) 240 MG 24 hr capsule Take by mouth. Take 1 capsule by mouth once daily    . DULoxetine (CYMBALTA) 30 MG capsule Take 3 capsules (90 mg total) by mouth daily. 270 capsule 0  . esomeprazole (NEXIUM) 40 MG capsule Take 40 mg by mouth daily.    . famotidine (PEPCID) 40 MG tablet     . ferrous sulfate 325 (65 FE) MG tablet Take by mouth. Take 325 mg daily with breakfast    . fluticasone (FLONASE) 50 MCG/ACT nasal spray     . glipiZIDE (GLUCOTROL) 5 MG tablet Take 1 tablet (5 mg total) by mouth 2 (two) times daily before a meal. 60 tablet 0  . HUMALOG KWIKPEN 100 UNIT/ML KwikPen     . ketoconazole (NIZORAL) 2 % cream 1 application.    Marland Kitchen lamoTRIgine (LAMICTAL) 100 MG tablet To be combined with 25 mg 90 tablet 0  . lamoTRIgine (LAMICTAL) 25 MG tablet TAKE ONE TABLET BY MOUTH DAILY. TO BE COMBINED WITH 100 MG. 90 tablet 0  . levothyroxine (SYNTHROID, LEVOTHROID) 100 MCG tablet Take 100 mcg by mouth daily.    Marland Kitchen loratadine (CLARITIN) 10 MG tablet Take 10 mg by mouth daily as needed for allergies.    Marland Kitchen MAGNESIUM PO Take 500 mg by mouth 2 (two) times daily.    . metFORMIN (GLUCOPHAGE-XR) 500 MG 24 hr tablet Take 500 mg by mouth 2 (two) times daily.     . metoprolol succinate (TOPROL-XL) 25 MG 24 hr tablet Take by mouth.    Marland Kitchen  naloxone (NARCAN) nasal spray 4 mg/0.1 mL 1 spray into nose for opioid overdose. If needed, may repeat spray in 2-3 min    . NUCYNTA ER 100 MG 12 hr tablet Take 1 tablet by mouth 2 (two) times daily.    . ondansetron (ZOFRAN) 4 MG tablet TAKE (1) TABLET BY MOUTH EVERY 8 HOURS AS NEEDED FOR NAUSEA  AND VOMITING    . ondansetron (ZOFRAN-ODT) 4 MG disintegrating tablet Take 4 mg by mouth every 8 (eight) hours as needed for nausea or vomiting.    . pioglitazone (ACTOS) 15 MG tablet Take by mouth. Take 1 tablet by mouth once daily    . quinapril (ACCUPRIL) 10 MG tablet Take 10 mg by mouth daily.    . ranitidine (ZANTAC) 150 MG tablet Take 150 mg by mouth 2 (two) times daily.    . rizatriptan (MAXALT) 10 MG tablet Take 1 tab at migraine onset. May repeat in 2 hours prn.  Limit to 2 in 24 hours and to 2 days weekly.    . sitaGLIPtin (JANUVIA) 100 MG tablet Take 100 mg by mouth daily.    . traZODone (DESYREL) 50 MG tablet TAKE ONE TABLET BY MOUTH AT BEDTIME AS NEEDED. *DO NOT COMBINE WITH WITH AMBIEN 90 tablet 0  . dabigatran (PRADAXA) 150 MG CAPS capsule Take 150 mg by mouth 2 (two) times daily.    . hydrOXYzine (VISTARIL) 25 MG capsule Take 1 capsule (25 mg total) by mouth 2 (two) times daily as needed. Severe anxiety attacks 180 capsule 0  . metoCLOPramide (REGLAN) 10 MG tablet Take 1 tablet (10 mg total) by mouth every 8 (eight) hours as needed for up to 3 days for nausea. 20 tablet 0   No current facility-administered medications for this visit.      Musculoskeletal: Strength & Muscle Tone: UTA Gait & Station: UTA Patient leans: N/A  Psychiatric Specialty Exam: Review of Systems  Psychiatric/Behavioral: The patient is nervous/anxious and has insomnia.   All other systems reviewed and are negative.   There were no vitals taken for this visit.There is no height or weight on file to calculate BMI.  General Appearance: UTA  Eye Contact:  UTA  Speech:  Clear and Coherent  Volume:  Normal  Mood:  Anxious  Affect:  UTA  Thought Process:  Goal Directed and Descriptions of Associations: Intact  Orientation:  Full (Time, Place, and Person)  Thought Content: Logical   Suicidal Thoughts:  No  Homicidal Thoughts:  No  Memory:  Immediate;   Fair Recent;   Fair Remote;   Fair   Judgement:  Fair  Insight:  Fair  Psychomotor Activity:  UTA  Concentration:  Concentration: Fair and Attention Span: Fair  Recall:  [employer] of Knowledge: Fair  Language: Fair  Akathisia:  No  Handed:  Right  AIMS (if indicated): Denies tremors, rigidity, stiffness  Assets:  Communication Skills Desire for Improvement Social Support  ADL's:  Intact  Cognition: WNL  Sleep:  Restless   Screenings:   Assessment and Plan: Neoma Laming is a 53 year old Caucasian female who has a history of depression, PTSD, chronic pain, migraine headaches, history of CVA, iron deficiency anemia, prothrombin gene mutation was evaluated by phone today.  Patient is currently having anxiety attacks due to the current COVID-19 outbreak.  Discussed medications changes as noted below.  She will also continue psychotherapy sessions.  Plan MDD- improving Cymbalta 90 mg p.o. daily Lamictal 125 mg p.o. daily Abilify 5 mg p.o. daily  Wellbutrin XL 150 mg p.o. daily.  For anxiety disorder unspecified-unstable Start hydroxyzine 25 mg p.o. twice daily as needed Discussed to continue psychotherapy sessions.  PTSD- improving Cymbalta 90 mg p.o. daily Continue CBT  For insomnia-restless Trazodone as prescribed for now. She also has hydroxyzine as needed.  Follow-up in clinic in 1 to 2 months or sooner if needed.  I have spent atleast 15 minutes non face to face with patient today. More than 50 % of the time was spent for psychoeducation and supportive psychotherapy and care coordination.  This note was generated in part or whole with voice recognition software. Voice recognition is usually quite accurate but there are transcription errors that can and very often do occur. I apologize for any typographical errors that were not detected and corrected.          Ursula Alert, MD 08/28/2018, 1:39 PM

## 2018-08-28 NOTE — Progress Notes (Signed)
TC on  08-28-18 @ 9:22 spoke with patients . Medications and pharmacy was reviewed and updated.   Allergies , medical hx and surgery hx was reviewed with no changes.  Vital unable to obtain due to this is a phone or web consult.

## 2018-09-06 ENCOUNTER — Telehealth: Payer: Self-pay | Admitting: Hematology and Oncology

## 2018-09-06 ENCOUNTER — Other Ambulatory Visit: Payer: Self-pay

## 2018-09-06 ENCOUNTER — Inpatient Hospital Stay: Payer: Medicare Other | Attending: Hematology and Oncology

## 2018-09-06 DIAGNOSIS — D509 Iron deficiency anemia, unspecified: Secondary | ICD-10-CM | POA: Insufficient documentation

## 2018-09-06 LAB — CBC WITH DIFFERENTIAL/PLATELET
Abs Immature Granulocytes: 0.04 10*3/uL (ref 0.00–0.07)
Basophils Absolute: 0 10*3/uL (ref 0.0–0.1)
Basophils Relative: 0 %
Eosinophils Absolute: 0.1 10*3/uL (ref 0.0–0.5)
Eosinophils Relative: 2 %
HCT: 46.3 % — ABNORMAL HIGH (ref 36.0–46.0)
Hemoglobin: 15.1 g/dL — ABNORMAL HIGH (ref 12.0–15.0)
Immature Granulocytes: 1 %
Lymphocytes Relative: 33 %
Lymphs Abs: 2.3 10*3/uL (ref 0.7–4.0)
MCH: 29.5 pg (ref 26.0–34.0)
MCHC: 32.6 g/dL (ref 30.0–36.0)
MCV: 90.6 fL (ref 80.0–100.0)
Monocytes Absolute: 0.6 10*3/uL (ref 0.1–1.0)
Monocytes Relative: 8 %
Neutro Abs: 4 10*3/uL (ref 1.7–7.7)
Neutrophils Relative %: 56 %
Platelets: 239 10*3/uL (ref 150–400)
RBC: 5.11 MIL/uL (ref 3.87–5.11)
RDW: 13.8 % (ref 11.5–15.5)
WBC: 7.1 10*3/uL (ref 4.0–10.5)
nRBC: 0 % (ref 0.0–0.2)

## 2018-09-06 LAB — FERRITIN: Ferritin: 17 ng/mL (ref 11–307)

## 2018-09-07 ENCOUNTER — Encounter: Payer: Self-pay | Admitting: Hematology and Oncology

## 2018-09-07 ENCOUNTER — Ambulatory Visit: Payer: Medicare Other

## 2018-09-07 ENCOUNTER — Inpatient Hospital Stay: Payer: Medicare Other | Attending: Hematology and Oncology | Admitting: Hematology and Oncology

## 2018-09-07 DIAGNOSIS — D509 Iron deficiency anemia, unspecified: Secondary | ICD-10-CM | POA: Diagnosis not present

## 2018-09-07 NOTE — Addendum Note (Signed)
Addended by: Vito Berger on: 09/07/2018 02:23 PM   Modules accepted: Orders

## 2018-09-07 NOTE — Progress Notes (Signed)
The patient c/o headache pain ( 5), The patient Name , DOB, address has been verified by phone today.

## 2018-09-07 NOTE — Progress Notes (Signed)
[facility]  [address], [address] [place], [place] [address] Phone: [phone]  Fax: [phone]   Telemedicine Office Visit:  09/07/2018  Referring physician: Weyman Rodney, MD  I connected with Epifania Gore on 09/07/18 at 10:08 AM EDT by videoconferencing and verified that I was speaking with the correct person using 2 identifiers.  The patient was at home.  I discussed the limitations, risk, security and privacy concerns of performing an evaluation and management service by videoconferencing and the availability of in person appointments.  I also discussed with the patient that there may be a patient responsible charge related to this service.  The patient expressed understanding and agreed to proceed.    Chief Complaint: Christine Mcneil is a 53 y.o. female with iron deficiency anemia who is seen for 3 month assessment.  HPI:  The patient was last seen in the hematology clinic on 06/08/2018 for initial assessment by me. She had a history of chronic iron deficiency.  She had undergone a work-up without apparent etiology of her iron deficiency.  She denied any melena, hematochezia, hematuria or vaginal bleeding.  EGD and colonoscopy were negative.  Diet was fairly good.  Symptomatically, she felt "pretty good".  Ice pica had resolved.  Exam was unremarkable.  Hemoglobin was 14.3 and MCV 89.6.  Ferritin was 53.  Urinalysis revealed no blood.  During the interim, she has done well.  She states "everything is good".    She denies any bleeding except hemorrhoidal bleeding.  Bleeding is estimated at < 1 tsp/day.  She notes some reflux.  She continues to eat meat 3x/week.  She does not eat much green leafy vegetables except for broccoli and cheese.   Past Medical History:  Diagnosis Date  . Abdominal pain   . Anxiety and depression   . Atypical facial pain   . Bilateral occipital neuralgia   . Cervical spondylosis without myelopathy   . Chronic daily headache    . Chronic pain   . Chronic prostatitis   . Chronic thoracic back pain   . Depression   . Diabetes mellitus without complication ([place])   . Diabetes mellitus, type II ([place])   . Dysphagia   . Dysrhythmia   . Fibromyalgia   . Hyperlipidemia   . Hypertension   . Insomnia   . Iron deficiency anemia   . Left hip pain   . Low back pain   . Migraines   . Numbness   . Optic neuropathy   . OSA (obstructive sleep apnea)   . Polyarthralgia   . Primary osteoarthritis of both hips   . 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 Pseudotumor cerebri   . Rectal bleeding   . Right shoulder pain   . Rotator cuff syndrome   . Stroke ([place])   . Thyroid disease   . TIA (transient ischemic attack) 05/27/2017    Past Surgical History:  Procedure Laterality Date  . ABDOMINAL HYSTERECTOMY     total  . CHOLECYSTECTOMY    . COLONOSCOPY WITH PROPOFOL N/A 03/21/2018   Procedure: COLONOSCOPY WITH PROPOFOL;  Surgeon: Lucilla Lame, MD;  Location: [facility] ENDOSCOPY;  Service: Endoscopy;  Laterality: N/A;  . ESOPHAGOGASTRODUODENOSCOPY (EGD) WITH PROPOFOL N/A 03/21/2018   Procedure: ESOPHAGOGASTRODUODENOSCOPY (EGD) WITH PROPOFOL;  Surgeon: Lucilla Lame, MD;  Location: [facility] ENDOSCOPY;  Service: Endoscopy;  Laterality: N/A;  . JOINT REPLACEMENT    . KNEE SURGERY Left   . ROTATOR CUFF REPAIR Right   .  spinal neurostimulator      Family History  Problem Relation Age of Onset  . Diabetes Mother   . Hyperlipidemia Mother   . Hypertension Mother   . COPD Mother   . CVA Mother   . Anemia Mother   . Diabetes Father   . Hyperlipidemia Father   . Hypertension Father   . Thyroid disease Father   . Alcohol abuse Father   . Peripheral vascular disease Sister   . Hypertension Sister   . Anxiety disorder Son   . Depression Son   . Bipolar disorder Son   . Seizures Son     Social History:  reports that she has never smoked. She has never used smokeless tobacco. She reports that she does not drink alcohol or use  drugs.  She has been on disability since 2013 to difficulty with ambulation and fibromyalgia.  She lives in [place].    Participants in the patient's visit and their role in the encounter included the patient and Vito Berger, CMA, today.  The intake visit was provided by Vito Berger, CMA.    Allergies:  Allergies  Allergen Reactions  . Amoxicillin Itching  . Amoxapine Itching  . Dapagliflozin     Other reaction(s): Other (See Comments) Thrush & vaginal itching  . Keflex [Cephalexin] Itching    Current Medications: Current Outpatient Medications  Medication Sig Dispense Refill  . acetaZOLAMIDE (DIAMOX) 125 MG tablet     . AIMOVIG 70 MG/ML SOAJ Inject 70 mg into the skin every 28 (twenty-eight) days.    . ARIPiprazole (ABILIFY) 5 MG tablet Take 1 tablet (5 mg total) by mouth daily. 90 tablet 0  . aspirin-acetaminophen-caffeine (EXCEDRIN MIGRAINE) 250-250-65 MG tablet Take 2 tablets by mouth every 8 (eight) hours as needed for headache or migraine.     Marland Kitchen atorvastatin (LIPITOR) 80 MG tablet Take 80 mg by mouth daily at 6 PM.    . Blood Glucose Monitoring Suppl (ACCU-CHEK AVIVA PLUS) w/Device KIT     . buPROPion (WELLBUTRIN XL) 150 MG 24 hr tablet TAKE ONE TABLET BY MOUTH DAILY. 90 tablet 0  . Carboxymethylcellulose Sod PF (REFRESH PLUS) 0.5 % SOLN Apply to eye.    . Cholecalciferol (VITAMIN D3) 1000 units CAPS Take 1 capsule by mouth daily.    . clonazePAM (KLONOPIN) 0.5 MG tablet May use up to once daily as needed for facial pain    . clotrimazole (LOTRIMIN) 1 % cream     . cyclobenzaprine (FLEXERIL) 5 MG tablet     . dabigatran (PRADAXA) 150 MG CAPS capsule Take 150 mg by mouth 2 (two) times daily.    . diclofenac (FLECTOR) 1.3 % PTCH     . diltiazem (CARDIZEM CD) 240 MG 24 hr capsule Take by mouth. Take 1 capsule by mouth once daily    . DULoxetine (CYMBALTA) 30 MG capsule Take 3 capsules (90 mg total) by mouth daily. 270 capsule 0  . esomeprazole (NEXIUM) 40 MG capsule  Take 40 mg by mouth daily.    . famotidine (PEPCID) 40 MG tablet     . ferrous sulfate 325 (65 FE) MG tablet Take by mouth. Take 325 mg daily with breakfast    . fluticasone (FLONASE) 50 MCG/ACT nasal spray     . glipiZIDE (GLUCOTROL) 5 MG tablet Take 1 tablet (5 mg total) by mouth 2 (two) times daily before a meal. 60 tablet 0  . HUMALOG KWIKPEN 100 UNIT/ML KwikPen     . hydrOXYzine (VISTARIL) 25  MG capsule Take 1 capsule (25 mg total) by mouth 2 (two) times daily as needed. Severe anxiety attacks 180 capsule 0  . ketoconazole (NIZORAL) 2 % cream 1 application.    Marland Kitchen lamoTRIgine (LAMICTAL) 100 MG tablet To be combined with 25 mg 90 tablet 0  . lamoTRIgine (LAMICTAL) 25 MG tablet TAKE ONE TABLET BY MOUTH DAILY. TO BE COMBINED WITH 100 MG. 90 tablet 0  . levothyroxine (SYNTHROID, LEVOTHROID) 100 MCG tablet Take 100 mcg by mouth daily.    Marland Kitchen loratadine (CLARITIN) 10 MG tablet Take 10 mg by mouth daily as needed for allergies.    Marland Kitchen MAGNESIUM PO Take 500 mg by mouth 2 (two) times daily.    . metFORMIN (GLUCOPHAGE-XR) 500 MG 24 hr tablet Take 500 mg by mouth 2 (two) times daily.     . metoprolol succinate (TOPROL-XL) 25 MG 24 hr tablet Take by mouth.    Gean Birchwood ER 100 MG 12 hr tablet Take 1 tablet by mouth 2 (two) times daily.    . pioglitazone (ACTOS) 15 MG tablet Take by mouth. Take 1 tablet by mouth once daily    . quinapril (ACCUPRIL) 10 MG tablet Take 10 mg by mouth daily.    . rizatriptan (MAXALT) 10 MG tablet Take 1 tab at migraine onset. May repeat in 2 hours prn.  Limit to 2 in 24 hours and to 2 days weekly.    . sitaGLIPtin (JANUVIA) 100 MG tablet Take 100 mg by mouth daily.    . traZODone (DESYREL) 50 MG tablet TAKE ONE TABLET BY MOUTH AT BEDTIME AS NEEDED. *DO NOT COMBINE WITH WITH AMBIEN 90 tablet 0  . ACCU-CHEK AVIVA PLUS test strip     . ACCU-CHEK SOFTCLIX LANCETS lancets     . metoCLOPramide (REGLAN) 10 MG tablet Take 1 tablet (10 mg total) by mouth every 8 (eight) hours as needed  for up to 3 days for nausea. 20 tablet 0  . naloxone (NARCAN) nasal spray 4 mg/0.1 mL 1 spray into nose for opioid overdose. If needed, may repeat spray in 2-3 min    . ondansetron (ZOFRAN) 4 MG tablet TAKE (1) TABLET BY MOUTH EVERY 8 HOURS AS NEEDED FOR NAUSEA AND VOMITING    . ondansetron (ZOFRAN-ODT) 4 MG disintegrating tablet Take 4 mg by mouth every 8 (eight) hours as needed for nausea or vomiting.    . ranitidine (ZANTAC) 150 MG tablet Take 150 mg by mouth 2 (two) times daily.     No current facility-administered medications for this visit.     Review of Systems:  GENERAL:  Feels "everything is good".  No fevers, sweats or weight loss. PERFORMANCE STATUS (ECOG):  1 HEENT:  Allergies and runny nose.  No visual changes, sore throat, mouth sores or tenderness. Lungs: No shortness of breath or cough.  No hemoptysis. Cardiac:  No chest pain, palpitations, orthopnea, or PND. GI:  No nausea, vomiting, diarrhea, constipation, melena or hematochezia. GU:  No urgency, frequency, dysuria, or hematuria. Musculoskeletal:  Fibromyalgia.  Osteoarthritis in hips (undergoing pool therapy).  No muscle tenderness. Extremities:  No pain or swelling. Skin:  Easy bruising on Pradaxa.  No rashes or skin changes. Neuro:  Chronic headaches (undergoing nasal injections).  No numbness or weakness, balance or coordination issues. Endocrine:  Diabetes.  Thyroid disease on Synthroid.  No hot flashes or night sweats. Psych:  No mood changes, depression or anxiety. Pain:  "Whole body pain" due to fibromyalgia. Review of systems:  All other  systems reviewed and found to be negative.   Physical Exam: There were no vitals taken for this visit. GENERAL:  Well developed, well nourished, woman sitting comfortably in her home in no acute distress. MENTAL STATUS:  Alert and oriented to person, place and time. HEAD:  Brown hair.  Normocephalic, atraumatic, face symmetric, no Cushingoid features. EYES:  No conjunctivitis  or scleral icterus. PSYCH:  Appropriate.    Appointment on 09/06/2018  Component Date Value Ref Range Status  . Ferritin 09/06/2018 17  11 - 307 ng/mL Final   Performed at [facility], [address] [place], [place] [address]  . WBC 09/06/2018 7.1  4.0 - 10.5 K/uL Final  . RBC 09/06/2018 5.11  3.87 - 5.11 MIL/uL Final  . Hemoglobin 09/06/2018 15.1* 12.0 - 15.0 g/dL Final  . HCT 09/06/2018 46.3* 36.0 - 46.0 % Final  . MCV 09/06/2018 90.6  80.0 - 100.0 fL Final  . MCH 09/06/2018 29.5  26.0 - 34.0 pg Final  . MCHC 09/06/2018 32.6  30.0 - 36.0 g/dL Final  . RDW 09/06/2018 13.8  11.5 - 15.5 % Final  . Platelets 09/06/2018 239  150 - 400 K/uL Final  . nRBC 09/06/2018 0.0  0.0 - 0.2 % Final  . Neutrophils Relative % 09/06/2018 56  % Final  . Neutro Abs 09/06/2018 4.0  1.7 - 7.7 K/uL Final  . Lymphocytes Relative 09/06/2018 33  % Final  . Lymphs Abs 09/06/2018 2.3  0.7 - 4.0 K/uL Final  . Monocytes Relative 09/06/2018 8  % Final  . Monocytes Absolute 09/06/2018 0.6  0.1 - 1.0 K/uL Final  . Eosinophils Relative 09/06/2018 2  % Final  . Eosinophils Absolute 09/06/2018 0.1  0.0 - 0.5 K/uL Final  . Basophils Relative 09/06/2018 0  % Final  . Basophils Absolute 09/06/2018 0.0  0.0 - 0.1 K/uL Final  . Immature Granulocytes 09/06/2018 1  % Final  . Abs Immature Granulocytes 09/06/2018 0.04  0.00 - 0.07 K/uL Final   Performed at [facility], [address], [place], [place] [address]    Assessment:  MALONE ADMIRE is a 53 y.o. female with iron deficiency anemia of unclear etiology.  She denies any melena, hematochezia, hematuria or melena.  Diet is fairly good.  Work-up on 01/12/2018 revealed a hematocrit of 38.2, hemoglobin 11.8, MCV 73.1, platelets 320,000, and white count 8100.  Reticulocyte count was 1.4%.  Ferritin was 10.  Iron studies included a saturation of 55% and a TIBC of 576.  Normal studies included:  B12, folate, SPEP, intrinsic factor antibodies, and  anti-parietal antibodies.  She received Venofer weekly x 4 (01/20/2018 - 02/09/2018) and 03/14/2018.  Ferritin has been followed:  10 on 01/12/2018, 7 on 01/18/2018, 48 on 03/07/2018, 53 on 06/07/2018, and 17 on 09/06/2018.  EGD on 03/21/2018 was normal.  Colonoscopy on 03/21/2018 revealed for 1 to 2 mm polyps in the sigmoid colon and internal hemorrhoids.  Pathology revealed hyperplastic polyps.  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  She has a prothrombin gene mutation.  She is on long term anticoagulation with Pradaxa.   Symptomatically, she feels "good".  She denies any increased fatigue or shortness of breath.  She denies any ice pica.  She has some hemorrhoidal bleeding.  Hemoglobin is 15.1.  Plan: 1.   Review labs from 09/06/2018. 2.   Iron deficiency anemia  Hemoglobin has improved from 14.3 to 15.1 in past 3 months.  MCV no  longer microcytic.  Iron stores are drifting down (ferritin 15).  Etiology of iron deficiency remains unclear.    Urinalysis revealed no hematuria.  Discuss possible capsule study (note to Dr Allen Norris).  Postpone Venofer as hemoglobin good.  Anticipate Venofer in next 1-2 months. 3.   RTC in 1 month for labs (CBC and ferritin). 4.   RTC in 3 months for MD assessment, labs (CBC with diff, ferritin- day before), and +/- Venofer  I discussed the assessment and treatment plan with the patient.  The patient was provided an opportunity to ask questions and all were answered.  The patient agreed with the plan and demonstrated an understanding of the instructions.  The patient was advised to call back or seek an in person evaluation if the symptoms worsen or if the condition fails to improve as anticipated.  I provided 11 minutes of face-to-face video visit time during this this encounter and > 50% was spent counseling as documented under my assessment and plan.  I provided these services from the [facility] office.   Lequita Asal, MD, PhD   09/07/2018, 10:08 AM

## 2018-09-12 ENCOUNTER — Other Ambulatory Visit: Payer: Self-pay | Admitting: Psychiatry

## 2018-09-12 DIAGNOSIS — F4310 Post-traumatic stress disorder, unspecified: Secondary | ICD-10-CM

## 2018-09-12 DIAGNOSIS — F331 Major depressive disorder, recurrent, moderate: Secondary | ICD-10-CM

## 2018-10-01 NOTE — Progress Notes (Signed)
   THERAPIST PROGRESS NOTE  Session Time: 79min  Participation Level: Active  Behavioral Response: CasualAlertEuthymic  Type of Therapy: Individual Therapy  Treatment Goals addressed: Coping  Interventions: Solution Focused  Summary: Christine Mcneil is a 53 y.o. female who presents with a reduction in symptoms.  Explored stressors and triggers.   Suicidal/Homicidal: No  Plan: Return again in 2 weeks.  Diagnosis: Axis I: Depression    Axis II: No diagnosis    Lubertha South, LCSW 07/12/2018

## 2018-10-05 ENCOUNTER — Inpatient Hospital Stay: Payer: Medicare Other | Attending: Hematology and Oncology

## 2018-10-05 ENCOUNTER — Other Ambulatory Visit: Payer: Self-pay

## 2018-10-05 DIAGNOSIS — D509 Iron deficiency anemia, unspecified: Secondary | ICD-10-CM

## 2018-10-05 LAB — CBC WITH DIFFERENTIAL/PLATELET
Abs Immature Granulocytes: 0.04 10*3/uL (ref 0.00–0.07)
Basophils Absolute: 0 10*3/uL (ref 0.0–0.1)
Basophils Relative: 1 %
Eosinophils Absolute: 0.1 10*3/uL (ref 0.0–0.5)
Eosinophils Relative: 2 %
HCT: 43.4 % (ref 36.0–46.0)
Hemoglobin: 14.2 g/dL (ref 12.0–15.0)
Immature Granulocytes: 1 %
Lymphocytes Relative: 29 %
Lymphs Abs: 2.2 10*3/uL (ref 0.7–4.0)
MCH: 29.6 pg (ref 26.0–34.0)
MCHC: 32.7 g/dL (ref 30.0–36.0)
MCV: 90.6 fL (ref 80.0–100.0)
Monocytes Absolute: 0.5 10*3/uL (ref 0.1–1.0)
Monocytes Relative: 7 %
Neutro Abs: 4.6 10*3/uL (ref 1.7–7.7)
Neutrophils Relative %: 60 %
Platelets: 224 10*3/uL (ref 150–400)
RBC: 4.79 MIL/uL (ref 3.87–5.11)
RDW: 13.6 % (ref 11.5–15.5)
WBC: 7.5 10*3/uL (ref 4.0–10.5)
nRBC: 0 % (ref 0.0–0.2)

## 2018-10-05 LAB — FERRITIN: Ferritin: 17 ng/mL (ref 11–307)

## 2018-10-06 ENCOUNTER — Other Ambulatory Visit: Payer: Medicare Other

## 2018-10-06 NOTE — Progress Notes (Signed)
   THERAPIST PROGRESS NOTE  Session Time: 46min  Participation Level: Active  Behavioral Response: CasualAlertEuthymic  Type of Therapy: Individual Therapy  Treatment Goals addressed: Coping  Interventions: Solution Focused  Summary: NATALEE TOMKIEWICZ is a 53 y.o. female who presents with a reduction in symptoms.  Discussed with patient CoVid 19.  Explored with Patient her thoughts and feelings of the stay at home order.  Assisted Patient with monitoring mood and using coping skills.  Suicidal/Homicidal: No  Plan: Return again in 2 weeks.  Diagnosis: Axis I: Depression    Axis II: No diagnosis    Lubertha South, LCSW 08/15/2018

## 2018-10-10 ENCOUNTER — Inpatient Hospital Stay: Payer: Medicare Other

## 2018-10-10 ENCOUNTER — Other Ambulatory Visit: Payer: Self-pay

## 2018-10-10 VITALS — BP 116/80 | HR 76 | Temp 97.0°F | Resp 20

## 2018-10-10 DIAGNOSIS — D509 Iron deficiency anemia, unspecified: Secondary | ICD-10-CM

## 2018-10-10 MED ORDER — IRON SUCROSE 20 MG/ML IV SOLN
200.0000 mg | Freq: Once | INTRAVENOUS | Status: AC
  Administered 2018-10-10: 200 mg via INTRAVENOUS
  Filled 2018-10-10: qty 10

## 2018-10-10 MED ORDER — SODIUM CHLORIDE 0.9 % IV SOLN
Freq: Once | INTRAVENOUS | Status: AC
  Administered 2018-10-10: 13:00:00 via INTRAVENOUS
  Filled 2018-10-10: qty 250

## 2018-10-10 MED ORDER — SODIUM CHLORIDE 0.9 % IV SOLN: 200.0000 mg | Freq: Once | INTRAVENOUS | Status: DC

## 2018-10-10 NOTE — Patient Instructions (Signed)
Iron Sucrose injection What is this medicine? IRON SUCROSE (AHY ern SOO krohs) is an iron complex. Iron is used to make healthy red blood cells, which carry oxygen and nutrients throughout the body. This medicine is used to treat iron deficiency anemia in people with chronic kidney disease. This medicine may be used for other purposes; ask your health care provider or pharmacist if you have questions. COMMON BRAND NAME(S): Venofer What should I tell my health care provider before I take this medicine? They need to know if you have any of these conditions: -anemia not caused by low iron levels -heart disease -high levels of iron in the blood -kidney disease -liver disease -an unusual or allergic reaction to iron, other medicines, foods, dyes, or preservatives -pregnant or trying to get pregnant -breast-feeding How should I use this medicine? This medicine is for infusion into a vein. It is given by a health care professional in a hospital or clinic setting. Talk to your pediatrician regarding the use of this medicine in children. While this drug may be prescribed for children as young as 2 years for selected conditions, precautions do apply. Overdosage: If you think you have taken too much of this medicine contact a poison control center or emergency room at once. NOTE: This medicine is only for you. Do not share this medicine with others. What if I miss a dose? It is important not to miss your dose. Call your doctor or health care professional if you are unable to keep an appointment. What may interact with this medicine? Do not take this medicine with any of the following medications: -deferoxamine -dimercaprol -other iron products This medicine may also interact with the following medications: -chloramphenicol -deferasirox This list may not describe all possible interactions. Give your health care provider a list of all the medicines, herbs, non-prescription drugs, or dietary  supplements you use. Also tell them if you smoke, drink alcohol, or use illegal drugs. Some items may interact with your medicine. What should I watch for while using this medicine? Visit your doctor or healthcare professional regularly. Tell your doctor or healthcare professional if your symptoms do not start to get better or if they get worse. You may need blood work done while you are taking this medicine. You may need to follow a special diet. Talk to your doctor. Foods that contain iron include: whole grains/cereals, dried fruits, beans, or peas, leafy green vegetables, and organ meats (liver, kidney). What side effects may I notice from receiving this medicine? Side effects that you should report to your doctor or health care professional as soon as possible: -allergic reactions like skin rash, itching or hives, swelling of the face, lips, or tongue -breathing problems -changes in blood pressure -cough -fast, irregular heartbeat -feeling faint or lightheaded, falls -fever or chills -flushing, sweating, or hot feelings -joint or muscle aches/pains -seizures -swelling of the ankles or feet -unusually weak or tired Side effects that usually do not require medical attention (report to your doctor or health care professional if they continue or are bothersome): -diarrhea -feeling achy -headache -irritation at site where injected -nausea, vomiting -stomach upset -tiredness This list may not describe all possible side effects. Call your doctor for medical advice about side effects. You may report side effects to FDA at 1-800-FDA-1088. Where should I keep my medicine? This drug is given in a hospital or clinic and will not be stored at home. NOTE: This sheet is a summary. It may not cover all possible information. If   you have questions about this medicine, talk to your doctor, pharmacist, or health care provider.  2019 Elsevier/Gold Standard (2011-02-18 17:14:35)  

## 2018-10-27 ENCOUNTER — Other Ambulatory Visit: Payer: Self-pay

## 2018-10-27 ENCOUNTER — Ambulatory Visit (INDEPENDENT_AMBULATORY_CARE_PROVIDER_SITE_OTHER): Payer: Medicare Other | Admitting: Psychiatry

## 2018-10-27 ENCOUNTER — Encounter: Payer: Self-pay | Admitting: Psychiatry

## 2018-10-27 DIAGNOSIS — F411 Generalized anxiety disorder: Secondary | ICD-10-CM

## 2018-10-27 DIAGNOSIS — F5105 Insomnia due to other mental disorder: Secondary | ICD-10-CM

## 2018-10-27 DIAGNOSIS — F331 Major depressive disorder, recurrent, moderate: Secondary | ICD-10-CM | POA: Diagnosis not present

## 2018-10-27 DIAGNOSIS — F4310 Post-traumatic stress disorder, unspecified: Secondary | ICD-10-CM

## 2018-10-27 MED ORDER — ARIPIPRAZOLE 5 MG PO TABS: 5.0000 mg | ORAL_TABLET | Freq: Every day | ORAL | 0 refills | Status: DC

## 2018-10-27 MED ORDER — LAMOTRIGINE 100 MG PO TABS: 100.0000 mg | ORAL_TABLET | Freq: Every day | ORAL | 0 refills | Status: DC

## 2018-10-27 MED ORDER — DULOXETINE HCL 30 MG PO CPEP: 90.0000 mg | ORAL_CAPSULE | Freq: Every day | ORAL | 0 refills | Status: DC

## 2018-10-27 MED ORDER — TRAZODONE HCL 100 MG PO TABS: 100.0000 mg | ORAL_TABLET | Freq: Every day | ORAL | 0 refills | Status: DC

## 2018-10-27 MED ORDER — BUPROPION HCL ER (XL) 150 MG PO TB24: 150.0000 mg | ORAL_TABLET | Freq: Every day | ORAL | 0 refills | Status: DC

## 2018-10-27 MED ORDER — LAMOTRIGINE 25 MG PO TABS: ORAL_TABLET | ORAL | 0 refills | Status: DC

## 2018-10-27 NOTE — Progress Notes (Signed)
Virtual Visit via Video Note  I connected with Christine Mcneil on 10/27/18 at  9:00 AM EDT by a video enabled telemedicine application and verified that I am speaking with the correct person using two identifiers.   I discussed the limitations of evaluation and management by telemedicine and the availability of in person appointments. The patient expressed understanding and agreed to proceed.   I discussed the assessment and treatment plan with the patient. The patient was provided an opportunity to ask questions and all were answered. The patient agreed with the plan and demonstrated an understanding of the instructions.   The patient was advised to call back or seek an in-person evaluation if the symptoms worsen or if the condition fails to improve as anticipated.   [place] MD OP Progress Note  10/27/2018 1:01 PM MALAYSHIA ALL  MRN:  [id number]  Chief Complaint:  Chief Complaint    Follow-up     HPI: Christine Mcneil is a 53 year old Caucasian female, divorced, lives in [place], on [place], has a history of MDD, PTSD, insomnia, OSA-resolved, history of CVA, history of prothrombin gene mutation, chronic pain, migraine headache, iron deficiency was evaluated by telemedicine today.  Attempted to contact by video however due to connection problem it had to be changed to be a phone call.  Patient today reports she is mildly anxious about the situational stressors of the current pandemic however has been coping okay.  She reports she continues to struggle with sleep.  Discussed with patient about readjusting her trazodone dosage and she agrees with plan.  She reports she has been compliant with all her medications as prescribed.  Patient denies any suicidality, homicidality or perceptual disturbances.  Patient denies any other concerns today.  Discussed continuing medications.  She will continue psychotherapy session with Ms. Peacock.   Visit Diagnosis:    ICD-10-CM   1. MDD (major depressive disorder),  recurrent episode, moderate (HCC) F33.1 traZODone (DESYREL) 100 MG tablet    buPROPion (WELLBUTRIN XL) 150 MG 24 hr tablet    ARIPiprazole (ABILIFY) 5 MG tablet    DULoxetine (CYMBALTA) 30 MG capsule    lamoTRIgine (LAMICTAL) 25 MG tablet    lamoTRIgine (LAMICTAL) 100 MG tablet  2. PTSD (post-traumatic stress disorder) F43.10 ARIPiprazole (ABILIFY) 5 MG tablet    DULoxetine (CYMBALTA) 30 MG capsule    lamoTRIgine (LAMICTAL) 25 MG tablet  3. GAD (generalized anxiety disorder) F41.1   4. Insomnia due to mental disorder F51.05 traZODone (DESYREL) 100 MG tablet    Past Psychiatric History: I have reviewed past psychiatric history from my progress note on 08/16/2017.  Past trials of Effexor, Klonopin.  Past Medical History:  Past Medical History:  Diagnosis Date  . Abdominal pain   . Anxiety and depression   . Atypical facial pain   . Bilateral occipital neuralgia   . Cervical spondylosis without myelopathy   . Chronic daily headache   . Chronic pain   . Chronic prostatitis   . Chronic thoracic back pain   . Depression   . Diabetes mellitus without complication ([place])   . Diabetes mellitus, type II ([place])   . Dysphagia   . Dysrhythmia   . Fibromyalgia   . Hyperlipidemia   . Hypertension   . Insomnia   . Iron deficiency anemia   . Left hip pain   . Low back pain   . Migraines   . Numbness   . Optic neuropathy   . OSA (obstructive sleep apnea)   . Polyarthralgia   .  Primary osteoarthritis of both hips   . Prothrombin gene mutation ([place])   . Pseudotumor cerebri   . Rectal bleeding   . Right shoulder pain   . Rotator cuff syndrome   . Stroke ([place])   . Thyroid disease   . TIA (transient ischemic attack) 05/27/2017    Past Surgical History:  Procedure Laterality Date  . ABDOMINAL HYSTERECTOMY     total  . CHOLECYSTECTOMY    . COLONOSCOPY WITH PROPOFOL N/A 03/21/2018   Procedure: COLONOSCOPY WITH PROPOFOL;  Surgeon: Lucilla Lame, MD;  [facility] END[facility]COPY;  Service:  Endoscopy;  Laterality: N/A;  . ESOPHAGOGASTRODUODENOSCOPY (EGD) WITH PROPOFOL N/A 03/21/2018   Procedure: ESOPHAGOGASTRODUODENOSCOPY (EGD) WITH PROPOFOL;  Surgeon: Lucilla Lame, MD;  Location: [facility] ENDOSCOPY;  Service: Endoscopy;  Laterality: N/A;  . JOINT REPLACEMENT    . KNEE SURGERY Left   . ROTATOR CUFF REPAIR Right   . spinal neurostimulator      Family Psychiatric History: I have reviewed family psychiatric history from my progress note on 08/16/2017.  Family History:  Family History  Problem Relation Age of Onset  . Diabetes Mother   . Hyperlipidemia Mother   . Hypertension Mother   . COPD Mother   . CVA Mother   . Anemia Mother   . Diabetes Father   . Hyperlipidemia Father   . Hypertension Father   . Thyroid disease Father   . Alcohol abuse Father   . Peripheral vascular disease Sister   . Hypertension Sister   . Anxiety disorder Son   . Depression Son   . Bipolar disorder Son   . Seizures Son     Social History: I have reviewed social history from my progress note on 08/16/2017. Social History   Socioeconomic History  . Marital status: Divorced    Spouse name: Not on file  . Number of children: 1  . Years of education: Not on file  . Highest education level: High school graduate  Occupational History    Comment: disablitiy  Social Needs  . Financial resource strain: Not hard at all  . Food insecurity:    Worry: Never true    Inability: Never true  . Transportation needs:    Medical: No    Non-medical: No  Tobacco Use  . Smoking status: Never Smoker  . Smokeless tobacco: Never Used  Substance and Sexual Activity  . Alcohol use: No  . Drug use: No  . Sexual activity: Not Currently  Lifestyle  . Physical activity:    Days per week: 0 days    Minutes per session: 0 min  . Stress: Very much  Relationships  . Social connections:    Talks on phone: Once a week    Gets together: Never    Attends religious service: More than 4 times per year     Active member of club or organization: No    Attends meetings of clubs or organizations: Never    Relationship status: Divorced  Other Topics Concern  . Not on file  Social History Narrative  . Not on file    Allergies:  Allergies  Allergen Reactions  . Amoxicillin Itching  . Amoxapine Itching  . Dapagliflozin     Other reaction(s): Other (See Comments) Thrush & vaginal itching  . Keflex [Cephalexin] Itching    Metabolic Disorder Labs: Lab Results  Component Value Date   HGBA1C 9.9 (H) 06/21/2017   MPG 237.43 06/21/2017   Lab Results  Component Value Date  PROLACTIN 5.5 09/06/2017   Lab Results  Component Value Date   CHOL 277 (H) 06/21/2017   TRIG 196 (H) 06/21/2017   HDL 64 06/21/2017   CHOLHDL 4.3 06/21/2017   VLDL 39 06/21/2017   LDLCALC 174 (H) 06/21/2017   Lab Results  Component Value Date   TSH 2.890 09/06/2017   TSH 3.176 01/04/2017    Therapeutic Level Labs: No results found for: LITHIUM No results found for: VALPROATE No components found for:  CBMZ  Current Medications: Current Outpatient Medications  Medication Sig Dispense Refill  . ACCU-CHEK AVIVA PLUS test strip     . ACCU-CHEK SOFTCLIX LANCETS lancets     . acetaZOLAMIDE (DIAMOX) 125 MG tablet     . AIMOVIG 70 MG/ML SOAJ Inject 70 mg into the skin every 28 (twenty-eight) days.    . ARIPiprazole (ABILIFY) 5 MG tablet Take 1 tablet (5 mg total) by mouth daily. 90 tablet 0  . aspirin-acetaminophen-caffeine (EXCEDRIN MIGRAINE) 250-250-65 MG tablet Take 2 tablets by mouth every 8 (eight) hours as needed for headache or migraine.     Marland Kitchen atorvastatin (LIPITOR) 80 MG tablet Take 80 mg by mouth daily at 6 PM.    . Blood Glucose Monitoring Suppl (ACCU-CHEK AVIVA PLUS) w/Device KIT     . buPROPion (WELLBUTRIN XL) 150 MG 24 hr tablet Take 1 tablet (150 mg total) by mouth daily. 90 tablet 0  . Carboxymethylcellulose Sod PF (REFRESH PLUS) 0.5 % SOLN Apply to eye.    . Cholecalciferol (VITAMIN D3) 1000  units CAPS Take 1 capsule by mouth daily.    . clonazePAM (KLONOPIN) 0.5 MG tablet May use up to once daily as needed for facial pain    . clotrimazole (LOTRIMIN) 1 % cream     . cyclobenzaprine (FLEXERIL) 5 MG tablet     . dabigatran (PRADAXA) 150 MG CAPS capsule Take 150 mg by mouth 2 (two) times daily.    . diclofenac (FLECTOR) 1.3 % PTCH     . diltiazem (CARDIZEM CD) 240 MG 24 hr capsule Take by mouth. Take 1 capsule by mouth once daily    . DULoxetine (CYMBALTA) 30 MG capsule Take 3 capsules (90 mg total) by mouth daily. 270 capsule 0  . esomeprazole (NEXIUM) 40 MG capsule Take 40 mg by mouth daily.    . famotidine (PEPCID) 40 MG tablet     . ferrous sulfate 325 (65 FE) MG tablet Take by mouth. Take 325 mg daily with breakfast    . fluticasone (FLONASE) 50 MCG/ACT nasal spray     . glipiZIDE (GLUCOTROL) 5 MG tablet Take 1 tablet (5 mg total) by mouth 2 (two) times daily before a meal. 60 tablet 0  . HUMALOG KWIKPEN 100 UNIT/ML KwikPen     . hydrOXYzine (VISTARIL) 25 MG capsule Take 1 capsule (25 mg total) by mouth 2 (two) times daily as needed. Severe anxiety attacks 180 capsule 0  . ketoconazole (NIZORAL) 2 % cream 1 application.    Marland Kitchen lamoTRIgine (LAMICTAL) 100 MG tablet Take 1 tablet (100 mg total) by mouth daily. To be combined with 25 mg 90 tablet 0  . lamoTRIgine (LAMICTAL) 25 MG tablet TAKE ONE TABLET BY MOUTH DAILY. TO BE COMBINED WITH 100 MG. 90 tablet 0  . levothyroxine (SYNTHROID, LEVOTHROID) 100 MCG tablet Take 100 mcg by mouth daily.    Marland Kitchen loratadine (CLARITIN) 10 MG tablet Take 10 mg by mouth daily as needed for allergies.    Marland Kitchen MAGNESIUM PO Take 500  mg by mouth 2 (two) times daily.    . metFORMIN (GLUCOPHAGE-XR) 500 MG 24 hr tablet Take 500 mg by mouth 2 (two) times daily.     . metoCLOPramide (REGLAN) 10 MG tablet Take 1 tablet (10 mg total) by mouth every 8 (eight) hours as needed for up to 3 days for nausea. 20 tablet 0  . metoprolol succinate (TOPROL-XL) 25 MG 24 hr tablet  Take by mouth.    . naloxone (NARCAN) nasal spray 4 mg/0.1 mL 1 spray into nose for opioid overdose. If needed, may repeat spray in 2-3 min    . NUCYNTA ER 100 MG 12 hr tablet Take 1 tablet by mouth 2 (two) times daily.    . ondansetron (ZOFRAN) 4 MG tablet TAKE (1) TABLET BY MOUTH EVERY 8 HOURS AS NEEDED FOR NAUSEA AND VOMITING    . ondansetron (ZOFRAN-ODT) 4 MG disintegrating tablet Take 4 mg by mouth every 8 (eight) hours as needed for nausea or vomiting.    . pioglitazone (ACTOS) 15 MG tablet Take by mouth. Take 1 tablet by mouth once daily    . quinapril (ACCUPRIL) 10 MG tablet Take 10 mg by mouth daily.    . ranitidine (ZANTAC) 150 MG tablet Take 150 mg by mouth 2 (two) times daily.    . rizatriptan (MAXALT) 10 MG tablet Take 1 tab at migraine onset. May repeat in 2 hours prn.  Limit to 2 in 24 hours and to 2 days weekly.    . sitaGLIPtin (JANUVIA) 100 MG tablet Take 100 mg by mouth daily.    . traZODone (DESYREL) 100 MG tablet Take 1-1.5 tablets (100-150 mg total) by mouth at bedtime. FOR SLEEP 135 tablet 0   No current facility-administered medications for this visit.      Musculoskeletal: Strength & Muscle Tone: within normal limits Gait & Station: reports WNL Patient leans: N/A  Psychiatric Specialty Exam: Review of Systems  Psychiatric/Behavioral: The patient is nervous/anxious and has insomnia.   All other systems reviewed and are negative.   There were no vitals taken for this visit.There is no height or weight on file to calculate BMI.  General Appearance: UTA  Eye Contact:  UTA  Speech:  Clear and Coherent  Volume:  Normal  Mood:  Anxious  Affect:  UTA  Thought Process:  Goal Directed and Descriptions of Associations: Intact  Orientation:  Full (Time, Place, and Person)  Thought Content: Logical   Suicidal Thoughts:  No  Homicidal Thoughts:  No  Memory:  Immediate;   Fair Recent;   Fair Remote;   Fair  Judgement:  Fair  Insight:  Fair  Psychomotor Activity:   UTA  Concentration:  Concentration: Fair and Attention Span: Fair  Recall:  [employer] of Knowledge: Fair  Language: Fair  Akathisia:  No  Handed:  Right  AIMS (if indicated): Denies tremors, rigidity, stiffness  Assets:  Communication Skills Desire for Improvement Housing Social Support  ADL's:  Intact  Cognition: WNL  Sleep:  Poor   Screenings:   Assessment and Plan: Christine Mcneil is a 53 year old Caucasian female who has a history of depression, PTSD, chronic pain, migraine headaches, history of CVA, iron deficiency, prothrombin gene mutation was evaluated by telemedicine today.  A video call was attempted but had to be changed to a phone call.  Patient continues to struggle with some anxiety symptoms due to the current situational stressors however is coping okay.  She does have some restlessness in her sleep and she  will benefit from continued medications.  Plan MDD-improving Cymbalta 90 mg p.o. daily Lamotrigine 125 mg p.o. daily Abilify 5 mg p.o. daily Wellbutrin XL 150 mg p.o. daily.  For GAD-improving Hydroxyzine 25 mg p.o. twice daily as needed Continue psychotherapy session  For PTSD-improving Continue CBT  For insomnia-restless Increase trazodone to 150 mg p.o. nightly.  Follow-up in clinic in 3 to 4 weeks or sooner if needed.  Appointment scheduled for July 7 at 2:15 PM  I have spent atleast 15 minutes non face to face with patient today. More than 50 % of the time was spent for psychoeducation and supportive psychotherapy and care coordination.  This note was generated in part or whole with voice recognition software. Voice recognition is usually quite accurate but there are transcription errors that can and very often do occur. I apologize for any typographical errors that were not detected and corrected.       Ursula Alert, MD 10/27/2018, 1:01 PM

## 2018-11-01 ENCOUNTER — Other Ambulatory Visit: Payer: Self-pay | Admitting: Psychiatry

## 2018-11-01 DIAGNOSIS — F331 Major depressive disorder, recurrent, moderate: Secondary | ICD-10-CM

## 2018-11-10 ENCOUNTER — Other Ambulatory Visit: Payer: Self-pay | Admitting: Psychiatry

## 2018-11-10 DIAGNOSIS — F419 Anxiety disorder, unspecified: Secondary | ICD-10-CM

## 2018-11-21 ENCOUNTER — Other Ambulatory Visit: Payer: Self-pay | Admitting: Family Medicine

## 2018-11-21 DIAGNOSIS — Z1231 Encounter for screening mammogram for malignant neoplasm of breast: Secondary | ICD-10-CM

## 2018-11-27 ENCOUNTER — Other Ambulatory Visit: Payer: Self-pay

## 2018-11-27 ENCOUNTER — Encounter: Payer: Self-pay | Admitting: Orthopaedic Surgery

## 2018-11-27 ENCOUNTER — Ambulatory Visit (INDEPENDENT_AMBULATORY_CARE_PROVIDER_SITE_OTHER): Payer: Medicare Other | Admitting: Orthopaedic Surgery

## 2018-11-27 ENCOUNTER — Ambulatory Visit: Payer: Self-pay

## 2018-11-27 VITALS — Ht 66.0 in | Wt 250.0 lb

## 2018-11-27 DIAGNOSIS — M25552 Pain in left hip: Secondary | ICD-10-CM | POA: Diagnosis not present

## 2018-11-27 DIAGNOSIS — M25551 Pain in right hip: Secondary | ICD-10-CM

## 2018-11-27 NOTE — Progress Notes (Signed)
Office Visit Note   Patient: Christine Mcneil           Date of Birth: 1965-06-22           MRN: [id number] Visit Date: 11/27/2018              Requested by: Weyman Rodney, MD [address] [place],  [place] [address] PCP: Weyman Rodney, MD   Assessment & Plan: Visit Diagnoses:  1. Pain in left hip   2. Pain in right hip     Plan: From my standpoint I do not believe this is an issue with arthritis at all from her hips.  Both hips move fluidly and her x-rays today as well as previous CT scan did not show any arthritic changes that are significant in either hip.  I have recommended weight loss.  I recommended she get her blood glucose under control as well.  For her trochanteric bursitis I recommended Voltaren gel on both hips and I showed her where to place these.  I would not recommend any type of steroid injections right now given her poorly controlled blood glucose.  We can see her back in 3 months for repeat exam and determine whether or not she would benefit from a steroid injection.  Again, I do not feel that she needs hip replacement surgery based on my exam and imaging study review.  All question concerns were answered and addressed.The patient meets the AMA guidelines for Morbid (severe) obesity with a BMI > 40.0 and I have recommended weight loss.  Follow-Up Instructions: Return in about 3 months (around 02/27/2019).   Orders:  Orders Placed This Encounter  Procedures   XR HIPS BILAT W OR W/O PELVIS 2V   No orders of the defined types were placed in this encounter.     Procedures: No procedures performed   Clinical Data: No additional findings.   Subjective: Chief Complaint  Patient presents with   Left Hip - Pain   Right Hip - Pain  Patient is a very pleasant 53 year old female who comes in today with bilateral hip pain is been going on for years.  The left hip hurts worse than the right hip.  She is had steroid injections but it sounds like it was over the  trochanteric area of both hips.  She states she is been told she needed hip replacements.  She cannot have an MRI due to the fact she has spinal cord stimulator.  She does have fibromyalgia.  She is on chronic pain medication with Nucynta.  She has a BMI of 40.35 and a hemoglobin A1c of over 8.  She is a type II diabetic.  Most of her pain is on the side of the hip but she does point to the groin on the left side of having some slight pain.  This is been going on for multiple years now.  She is worked on activity modification.  She says her hemoglobin A1c is come down slightly.  HPI  Review of Systems She currently denies any headache, chest pain, shortness of breath, fever, chills, nausea, vomiting  Objective: Vital Signs: Ht 5\' 6"  (1.676 m)    Wt 250 lb (113.4 kg)    BMI 40.35 kg/m   Physical Exam She is alert and orient x3 and in no acute distress Ortho Exam Examination of both her hips showed her to have fluid range of motion but no blocks to range of motion and no obvious pain  with full internal and external rotation of both hips.  She does have pain at palpation of the trochanteric area on both sides. Specialty Comments:  No specialty comments available.  Imaging: Xr Hips Bilat W Or W/o Pelvis 2v  Result Date: 11/27/2018 A low AP pelvis standing with bilateral lateral hip views show well-maintained hip joint and space bilaterally with no acute findings or evidence of significant arthritic changes.  An independent review of a CT scan done of her abdomen pelvis in 2019 shows no arthritic changes at all in either hip and a well-maintained joint space on both sides.  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   Chronic prostatitis/chronic pelvic pain syndrome 05/28/2016   Migraine without aura and without status migrainosus, not intractable 05/11/2016   Chronic anticoagulation 03/29/2016   Encounter for monitoring opioid maintenance therapy 11/04/2015   Dysphagia, neurologic 09/16/2015   Numbness 09/16/2015   Anti-cardiolipin antibody positive 09/01/2015   Prothrombin gene mutation ([place]) 09/01/2015   History of cerebral venous sinus thrombosis 06/27/2015   Anemia, iron deficiency 10/02/2014   Chronic thoracic back pain 07/24/2014   Bilateral occipital neuralgia 04/11/2014   Hypothyroidism, unspecified 10/17/2013   Type 2 diabetes, HbA1c goal < 7% ([place]) 10/17/2013   Cervicogenic headache 07/04/2013   Cervical spondylosis without myelopathy 11/16/2012   Rotator cuff syndrome 11/13/2012   Sinus congestion 11/07/2012   Abdominal pain 09/28/2012   Rectal bleeding 09/28/2012   Depression 02/22/2012   GERD (gastroesophageal reflux disease) 02/22/2012   Essential hypertension 02/22/2012   Obesity, unspecified 02/22/2012   Neuromuscular disorder ([place]) 02/22/2012   Pain in joint, pelvic region and thigh 01/17/2012   Insomnia secondary to chronic pain 10/04/2011   Right shoulder pain 10/04/2011   Atypical facial pain 10/03/2011   Chronic daily headache 10/03/2011   Fibromyalgia 10/03/2011   Hyperlipidemia, unspecified 10/03/2011   Past Medical History:  Diagnosis Date   Abdominal pain    Anxiety and depression    Atypical facial pain    Bilateral occipital neuralgia    Cervical spondylosis without myelopathy    Chronic daily headache    Chronic pain    Chronic prostatitis    Chronic thoracic back pain    Depression    Diabetes mellitus without complication ([place])    Diabetes mellitus, type II ([facility])    Dysphagia    Dysrhythmia     Fibromyalgia    Hyperlipidemia    Hypertension    Insomnia    Iron deficiency anemia    Left hip pain    Low back pain    Migraines    Numbness    Optic neuropathy    OSA (obstructive sleep apnea)    Polyarthralgia    Primary osteoarthritis of both hips    Prothrombin gene mutation ([place])    Pseudotumor cerebri    Rectal bleeding    Right shoulder pain    Rotator cuff syndrome    Stroke [facility])    Thyroid disease    TIA (transient ischemic attack) 05/27/2017    Family History  Problem Relation Age of Onset   Diabetes Mother  Hyperlipidemia Mother    Hypertension Mother    COPD Mother    CVA Mother    Anemia Mother    Diabetes Father    Hyperlipidemia Father    Hypertension Father    Thyroid disease Father    Alcohol abuse Father    Peripheral vascular disease Sister    Hypertension Sister    Anxiety disorder Son    Depression Son    Bipolar disorder Son    Seizures Son     Past Surgical History:  Procedure Laterality Date   ABDOMINAL HYSTERECTOMY     total   CHOLECYSTECTOMY     COLONOSCOPY WITH PROPOFOL N/A 03/21/2018   Procedure: COLONOSCOPY WITH PROPOFOL;  Surgeon: Lucilla Lame, MD;  Location: [facility] ENDOSCOPY;  Service: Endoscopy;  Laterality: N/A;   ESOPHAGOGASTRODUODENOSCOPY (EGD) WITH PROPOFOL N/A 03/21/2018   Procedure: ESOPHAGOGASTRODUODENOSCOPY (EGD) WITH PROPOFOL;  Surgeon: Lucilla Lame, MD;  Location: [facility] ENDOSCOPY;  Service: Endoscopy;  Laterality: N/A;   JOINT REPLACEMENT     KNEE SURGERY Left    ROTATOR CUFF REPAIR Right    spinal neurostimulator     Social History   Occupational History    Comment: disablitiy  Tobacco Use   Smoking status: Never Smoker   Smokeless tobacco: Never Used  Substance and Sexual Activity   Alcohol use: No   Drug use: No   Sexual activity: Not Currently

## 2018-11-28 ENCOUNTER — Encounter: Payer: Self-pay | Admitting: Psychiatry

## 2018-11-28 ENCOUNTER — Ambulatory Visit (INDEPENDENT_AMBULATORY_CARE_PROVIDER_SITE_OTHER): Payer: Medicare Other | Admitting: Psychiatry

## 2018-11-28 DIAGNOSIS — F411 Generalized anxiety disorder: Secondary | ICD-10-CM | POA: Diagnosis not present

## 2018-11-28 DIAGNOSIS — F331 Major depressive disorder, recurrent, moderate: Secondary | ICD-10-CM | POA: Insufficient documentation

## 2018-11-28 DIAGNOSIS — F5105 Insomnia due to other mental disorder: Secondary | ICD-10-CM | POA: Diagnosis not present

## 2018-11-28 DIAGNOSIS — F4310 Post-traumatic stress disorder, unspecified: Secondary | ICD-10-CM

## 2018-11-28 MED ORDER — BUPROPION HCL 75 MG PO TABS: 75.0000 mg | ORAL_TABLET | Freq: Every day | ORAL | 0 refills | Status: DC

## 2018-11-28 NOTE — Progress Notes (Signed)
Virtual Visit via Telephone Note  I connected with Christine Mcneil on 11/28/18 at  2:15 PM EDT by telephone and verified that I am speaking with the correct person using two identifiers.   I discussed the limitations, risks, security and privacy concerns of performing an evaluation and management service by telephone and the availability of in person appointments. I also discussed with the patient that there may be a patient responsible charge related to this service. The patient expressed understanding and agreed to proceed.    I discussed the assessment and treatment plan with the patient. The patient was provided an opportunity to ask questions and all were answered. The patient agreed with the plan and demonstrated an understanding of the instructions.   The patient was advised to call back or seek an in-person evaluation if the symptoms worsen or if the condition fails to improve as anticipated.   [place] MD OP Progress Note  11/28/2018 5:48 PM AANYAH LOA  MRN:  [id number]  Chief Complaint:  Chief Complaint    Follow-up     HPI: Christine Mcneil is a 53 year old Caucasian female, divorced, lives in [place], on [place], has a history of MDD, PTSD, insomnia, OSA-resolved, history of CVA, history of prothrombin gene mutation, chronic pain, migraine headache, iron deficiency was evaluated by phone today.  Patient preferred to do a phone call.  Patient today reports she has noticed some improvement in her mood symptoms.  She continues to be compliant with her medications.  She denies any significant side effects at this time.  She however reports she continues to struggle with some restlessness at night.  She reports her sleep is interrupted due to having some muscular jerks and feeling of falling in her sleep.  She reports she wonders whether this is a side effect of some of her medications or not.  She takes the Abilify during the day.  She however wants to stay on the Abilify at this dosage.  Discussed with her  that the Wellbutrin can be reduced to a lower dosage to see if that will help.  She will also reach out to her other providers to see if she is on any other medications which could be contributing to this.  She does report some restless leg symptoms at night.  Discussed with her that it cannot be addressed during the following sessions and to keep track of how her symptoms are once we make some readjustment today.  Patient denies any suicidality, homicidality or perceptual disturbances.  Patient denies any other concerns today. Visit Diagnosis:    ICD-10-CM   1. MDD (major depressive disorder), recurrent episode, moderate (HCC)  F33.1 buPROPion (WELLBUTRIN) 75 MG tablet  2. PTSD (post-traumatic stress disorder)  F43.10   3. GAD (generalized anxiety disorder)  F41.1   4. Insomnia due to mental disorder  F51.05     Past Psychiatric History: I have reviewed past psychiatric history from my progress note on 08/16/2017.  Past trials of Effexor, Klonopin.  Past Medical History:  Past Medical History:  Diagnosis Date  . Abdominal pain   . Anxiety and depression   . Atypical facial pain   . Bilateral occipital neuralgia   . Cervical spondylosis without myelopathy   . Chronic daily headache   . Chronic pain   . Chronic prostatitis   . Chronic thoracic back pain   . Depression   . Diabetes mellitus without complication ([place])   . Diabetes mellitus, type II ([place])   . Dysphagia   .  Dysrhythmia   . Fibromyalgia   . Hyperlipidemia   . Hypertension   . Insomnia   . Iron deficiency anemia   . Left hip pain   . Low back pain   . Migraines   . Numbness   . Optic neuropathy   . OSA (obstructive sleep apnea)   . Polyarthralgia   . Primary osteoarthritis of both hips   . Prothrombin gene mutation ([place])   . Pseudotumor cerebri   . Rectal bleeding   . Right shoulder pain   . Rotator cuff syndrome   . Stroke ([place])   . Thyroid disease   . TIA (transient ischemic attack) 05/27/2017    Past  Surgical History:  Procedure Laterality Date  . ABDOMINAL HYSTERECTOMY     total  . CHOLECYSTECTOMY    . COLONOSCOPY WITH PROPOFOL N/A 03/21/2018   Procedure: COLONOSCOPY WITH PROPOFOL;  Surgeon: Lucilla Lame, MD;  Location: [facility] ENDOSCOPY;  Service: Endoscopy;  Laterality: N/A;  . ESOPHAGOGASTRODUODENOSCOPY (EGD) WITH PROPOFOL N/A 03/21/2018   Procedure: ESOPHAGOGASTRODUODENOSCOPY (EGD) WITH PROPOFOL;  Surgeon: Lucilla Lame, MD;  Location: [facility] ENDOSCOPY;  Service: Endoscopy;  Laterality: N/A;  . JOINT REPLACEMENT    . KNEE SURGERY Left   . ROTATOR CUFF REPAIR Right   . spinal neurostimulator      Family Psychiatric History: I have reviewed family psychiatric history from my progress note on 08/16/2017. Family History:  Family History  Problem Relation Age of Onset  . Diabetes Mother   . Hyperlipidemia Mother   . Hypertension Mother   . COPD Mother   . CVA Mother   . Anemia Mother   . Diabetes Father   . Hyperlipidemia Father   . Hypertension Father   . Thyroid disease Father   . Alcohol abuse Father   . Peripheral vascular disease Sister   . Hypertension Sister   . Anxiety disorder Son   . Depression Son   . Bipolar disorder Son   . Seizures Son     Social History: I have reviewed social history from my progress note on 08/16/2017. Social History   Socioeconomic History  . Marital status: Divorced    Spouse name: Not on file  . Number of children: 1  . Years of education: Not on file  . Highest education level: High school graduate  Occupational History    Comment: disablitiy  Social Needs  . Financial resource strain: Not hard at all  . Food insecurity    Worry: Never true    Inability: Never true  . Transportation needs    Medical: No    Non-medical: No  Tobacco Use  . Smoking status: Never Smoker  . Smokeless tobacco: Never Used  Substance and Sexual Activity  . Alcohol use: No  . Drug use: No  . Sexual activity: Not Currently  Lifestyle  . Physical  activity    Days per week: 0 days    Minutes per session: 0 min  . Stress: Very much  Relationships  . Social Herbalist on phone: Once a week    Gets together: Never    Attends religious service: More than 4 times per year    Active member of club or organization: No    Attends meetings of clubs or organizations: Never    Relationship status: Divorced  Other Topics Concern  . Not on file  Social History Narrative  . Not on file    Allergies:  Allergies  Allergen Reactions  .  Amoxicillin Itching  . Amoxapine Itching  . Dapagliflozin     Other reaction(s): Other (See Comments) Thrush & vaginal itching  . Keflex [Cephalexin] Itching    Metabolic Disorder Labs: Lab Results  Component Value Date   HGBA1C 9.9 (H) 06/21/2017   MPG 237.43 06/21/2017   Lab Results  Component Value Date   PROLACTIN 5.5 09/06/2017   Lab Results  Component Value Date   CHOL 277 (H) 06/21/2017   TRIG 196 (H) 06/21/2017   HDL 64 06/21/2017   CHOLHDL 4.3 06/21/2017   VLDL 39 06/21/2017   LDLCALC 174 (H) 06/21/2017   Lab Results  Component Value Date   TSH 2.890 09/06/2017   TSH 3.176 01/04/2017    Therapeutic Level Labs: No results found for: LITHIUM No results found for: VALPROATE No components found for:  CBMZ  Current Medications: Current Outpatient Medications  Medication Sig Dispense Refill  . meloxicam (MOBIC) 7.5 MG tablet Take by mouth.    Marland Kitchen ACCU-CHEK AVIVA PLUS test strip     . ACCU-CHEK SOFTCLIX LANCETS lancets     . acetaZOLAMIDE (DIAMOX) 125 MG tablet     . AIMOVIG 70 MG/ML SOAJ Inject 70 mg into the skin every 28 (twenty-eight) days.    . ARIPiprazole (ABILIFY) 5 MG tablet Take 1 tablet (5 mg total) by mouth daily. 90 tablet 0  . aspirin-acetaminophen-caffeine (EXCEDRIN MIGRAINE) 250-250-65 MG tablet Take 2 tablets by mouth every 8 (eight) hours as needed for headache or migraine.     Marland Kitchen atorvastatin (LIPITOR) 80 MG tablet Take 80 mg by mouth daily at 6 PM.     . Blood Glucose Monitoring Suppl (ACCU-CHEK AVIVA PLUS) w/Device KIT     . buPROPion (WELLBUTRIN) 75 MG tablet Take 1 tablet (75 mg total) by mouth daily after breakfast. 90 tablet 0  . Carboxymethylcellulose Sod PF (REFRESH PLUS) 0.5 % SOLN Apply to eye.    . Cholecalciferol (VITAMIN D3) 1000 units CAPS Take 1 capsule by mouth daily.    . clonazePAM (KLONOPIN) 0.5 MG tablet May use up to once daily as needed for facial pain    . clotrimazole (LOTRIMIN) 1 % cream     . cyclobenzaprine (FLEXERIL) 5 MG tablet     . dabigatran (PRADAXA) 150 MG CAPS capsule Take 150 mg by mouth 2 (two) times daily.    . diclofenac (FLECTOR) 1.3 % PTCH     . diltiazem (CARDIZEM CD) 240 MG 24 hr capsule Take by mouth. Take 1 capsule by mouth once daily    . DULoxetine (CYMBALTA) 30 MG capsule Take 3 capsules (90 mg total) by mouth daily. 270 capsule 0  . esomeprazole (NEXIUM) 40 MG capsule Take 40 mg by mouth daily.    . famotidine (PEPCID) 40 MG tablet     . ferrous sulfate 325 (65 FE) MG tablet Take by mouth. Take 325 mg daily with breakfast    . fluticasone (FLONASE) 50 MCG/ACT nasal spray     . glipiZIDE (GLUCOTROL) 5 MG tablet Take 1 tablet (5 mg total) by mouth 2 (two) times daily before a meal. 60 tablet 0  . HUMALOG KWIKPEN 100 UNIT/ML KwikPen     . hydrOXYzine (VISTARIL) 25 MG capsule TAKE (1) CAPSULE BY MOUTH TWICE DAILY ASNEEDED FOR SEVERA ANXIETY ATTACKS 180 capsule 0  . ketoconazole (NIZORAL) 2 % cream 1 application.    Marland Kitchen lamoTRIgine (LAMICTAL) 100 MG tablet Take 1 tablet (100 mg total) by mouth daily. To be combined with 25 mg  90 tablet 0  . lamoTRIgine (LAMICTAL) 25 MG tablet TAKE ONE TABLET BY MOUTH DAILY. TO BE COMBINED WITH 100 MG. 90 tablet 0  . levothyroxine (SYNTHROID, LEVOTHROID) 100 MCG tablet Take 100 mcg by mouth daily.    Marland Kitchen loratadine (CLARITIN) 10 MG tablet Take 10 mg by mouth daily as needed for allergies.    Marland Kitchen MAGNESIUM PO Take 500 mg by mouth 2 (two) times daily.    . metFORMIN  (GLUCOPHAGE-XR) 500 MG 24 hr tablet Take 500 mg by mouth 2 (two) times daily.     . metoCLOPramide (REGLAN) 10 MG tablet Take 1 tablet (10 mg total) by mouth every 8 (eight) hours as needed for up to 3 days for nausea. 20 tablet 0  . metoprolol succinate (TOPROL-XL) 25 MG 24 hr tablet Take by mouth.    . naloxone (NARCAN) nasal spray 4 mg/0.1 mL 1 spray into nose for opioid overdose. If needed, may repeat spray in 2-3 min    . NUCYNTA ER 100 MG 12 hr tablet Take 1 tablet by mouth 2 (two) times daily.    . ondansetron (ZOFRAN) 4 MG tablet TAKE (1) TABLET BY MOUTH EVERY 8 HOURS AS NEEDED FOR NAUSEA AND VOMITING    . ondansetron (ZOFRAN-ODT) 4 MG disintegrating tablet Take 4 mg by mouth every 8 (eight) hours as needed for nausea or vomiting.    . pioglitazone (ACTOS) 15 MG tablet Take by mouth. Take 1 tablet by mouth once daily    . quinapril (ACCUPRIL) 10 MG tablet Take 10 mg by mouth daily.    . ranitidine (ZANTAC) 150 MG tablet Take 150 mg by mouth 2 (two) times daily.    . rizatriptan (MAXALT) 10 MG tablet Take 1 tab at migraine onset. May repeat in 2 hours prn.  Limit to 2 in 24 hours and to 2 days weekly.    . sitaGLIPtin (JANUVIA) 100 MG tablet Take 100 mg by mouth daily.    . traZODone (DESYREL) 100 MG tablet Take 1-1.5 tablets (100-150 mg total) by mouth at bedtime. FOR SLEEP 135 tablet 0  . TRESIBA FLEXTOUCH 200 UNIT/ML SOPN      No current facility-administered medications for this visit.      Musculoskeletal: Strength & Muscle Tone: UTA Gait & Station: UTA Patient leans: UTA  Psychiatric Specialty Exam: Review of Systems  Psychiatric/Behavioral: The patient has insomnia.   All other systems reviewed and are negative.   There were no vitals taken for this visit.There is no height or weight on file to calculate BMI.  General Appearance: UTA  Eye Contact:  UTA  Speech:  Clear and Coherent  Volume:  Normal  Mood:  Euthymic  Affect:  UTA  Thought Process:  Goal Directed and  Descriptions of Associations: Intact  Orientation:  Full (Time, Place, and Person)  Thought Content: Logical   Suicidal Thoughts:  No  Homicidal Thoughts:  No  Memory:  Immediate;   Fair Recent;   Fair Remote;   Fair  Judgement:  Fair  Insight:  Fair  Psychomotor Activity:  UTA  Concentration:  Concentration: Fair and Attention Span: Fair  Recall:  [employer] of Knowledge: Fair  Language: Fair  Akathisia:  No  Handed:  Right  AIMS (if indicated): Denies tremors, rigidity  Assets:  Communication Skills Desire for Improvement Housing  ADL's:  Intact  Cognition: WNL  Sleep:  restless - sensation of falling    Screenings:   Assessment and Plan: Claire is a  53 year old Caucasian female who has a history of MDD, PTSD, chronic pain, migraine headaches, history of CVA, iron deficiency, prothrombin gene mutation was evaluated by telemedicine today.  Patient preferred to do a phone call today.  She continues to struggle with some sleep related problems and hence will benefit from following medication changes.  Plan MDD-improving Cymbalta 90 mg p.o. daily Lamotrigine 125 mg p.o. daily. Abilify 5 mg p.o. daily. Reduce Wellbutrin to 75 mg p.o. daily.  For GAD-improving Hydroxyzine 25 mg p.o. twice daily as needed Continue psychotherapy.  PTSD-improving Continue CBT  For insomnia-restless Trazodone 150 mg p.o. nightly. She will keep track of her restlessness and the sleep-related issues.  Wellbutrin has been reduced to see if this will help.  Follow-up in clinic in 1 month or sooner if needed.  August 25 at 4:30 PM  I have spent atleast 15 minutes non face to face with patient today. More than 50 % of the time was spent for psychoeducation and supportive psychotherapy and care coordination.  This note was generated in part or whole with voice recognition software. Voice recognition is usually quite accurate but there are transcription errors that can and very often do occur. I  apologize for any typographical errors that were not detected and corrected.       Ursula Alert, MD 11/28/2018, 5:48 PM

## 2018-12-07 ENCOUNTER — Ambulatory Visit: Payer: Medicare Other

## 2018-12-07 ENCOUNTER — Inpatient Hospital Stay: Payer: Medicare Other | Attending: Hematology and Oncology

## 2018-12-07 ENCOUNTER — Other Ambulatory Visit: Payer: Self-pay

## 2018-12-07 ENCOUNTER — Ambulatory Visit: Payer: Medicare Other | Admitting: Hematology and Oncology

## 2018-12-07 DIAGNOSIS — D508 Other iron deficiency anemias: Secondary | ICD-10-CM | POA: Diagnosis not present

## 2018-12-07 DIAGNOSIS — D509 Iron deficiency anemia, unspecified: Secondary | ICD-10-CM

## 2018-12-07 LAB — CBC WITH DIFFERENTIAL/PLATELET
Abs Immature Granulocytes: 0.02 10*3/uL (ref 0.00–0.07)
Basophils Absolute: 0 10*3/uL (ref 0.0–0.1)
Basophils Relative: 0 %
Eosinophils Absolute: 0.1 10*3/uL (ref 0.0–0.5)
Eosinophils Relative: 1 %
HCT: 43.9 % (ref 36.0–46.0)
Hemoglobin: 14.5 g/dL (ref 12.0–15.0)
Immature Granulocytes: 0 %
Lymphocytes Relative: 33 %
Lymphs Abs: 2.5 10*3/uL (ref 0.7–4.0)
MCH: 29.7 pg (ref 26.0–34.0)
MCHC: 33 g/dL (ref 30.0–36.0)
MCV: 89.8 fL (ref 80.0–100.0)
Monocytes Absolute: 0.6 10*3/uL (ref 0.1–1.0)
Monocytes Relative: 8 %
Neutro Abs: 4.5 10*3/uL (ref 1.7–7.7)
Neutrophils Relative %: 58 %
Platelets: 211 10*3/uL (ref 150–400)
RBC: 4.89 MIL/uL (ref 3.87–5.11)
RDW: 13.1 % (ref 11.5–15.5)
WBC: 7.7 10*3/uL (ref 4.0–10.5)
nRBC: 0 % (ref 0.0–0.2)

## 2018-12-07 LAB — FERRITIN: Ferritin: 88 ng/mL (ref 11–307)

## 2018-12-11 NOTE — Progress Notes (Signed)
[facility]  [address] [place], [place] [address] Phone: [phone]  Fax: [phone]   Telemedicine Office Visit:  12/13/2018  Referring physician: Weyman Rodney, MD  I connected with Christine Mcneil on 12/13/2018 at 12:22 PM by videoconferencing and verified that I was speaking with the correct person using 2 identifiers.  The patient was at home.  I discussed the limitations, risk, security and privacy concerns of performing an evaluation and management service by videoconferencing and the availability of in person appointments.  I also discussed with the patient that there may be a patient responsible charge related to this service.  The patient expressed understanding and agreed to proceed.   Chief Complaint: Christine Mcneil is a 53 y.o. female with iron deficiency anemia who is seen for 3 month assessment.  HPI: The patient was last seen in the hematology clinic on 09/07/2018. At that time, she felt "good".  She denied any increased fatigue or shortness of breath.  She denied any ice pica.  She had some hemorrhoidal bleeding. Hemoglobin was 15.1.  Ferritin was 17.  She received Venofer x1 on 10/10/2018.  She was seen in orthopedics on 11/27/2018 by Dr. Jean Rosenthal.  Hip pain was not found to be arthritic, and she did not undergo steroid injections due to uncontrolled blood glucose level. She was not a candidate for hip replacement. She was advised to control her blood glucose, lose weight, and apply Voltaren gel on both hips. 34-monthfollow-up for injections was scheduled.   Labs followed: 10/05/2018: WBC 7,500, hemoglobin 14.2, hematocrit 43.4, platelets 224,000. Ferritin 17.  12/07/2018: WBC 7,700, hemoglobin 14.5, hematocrit 43.9, platelets 211,000. Ferritin 88.   During the interim, the patient has been fatigued. She has had shortness of breath due to the heat. She denies any chest pain. Her fibromyalgia has worsened, and she has been  off her pain medication for the past 4 days.   She gets lidocaine flushes in her nose for chronic headaches, which have been stable.    Past Medical History:  Diagnosis Date  . Abdominal pain   . Anxiety and depression   . Atypical facial pain   . Bilateral occipital neuralgia   . Cervical spondylosis without myelopathy   . Chronic daily headache   . Chronic pain   . Chronic prostatitis   . Chronic thoracic back pain   . Depression   . Diabetes mellitus without complication ([place]   . Diabetes mellitus, type II ([facility]   . Dysphagia   . Dysrhythmia   . Fibromyalgia   . Hyperlipidemia   . Hypertension   . Insomnia   . Iron deficiency anemia   . Left hip pain   . Low back pain   . Migraines   . Numbness   . Optic neuropathy   . OSA (obstructive sleep apnea)   . Polyarthralgia   . Primary osteoarthritis of both hips   . Prothrombin gene mutation ([place]   . Pseudotumor cerebri   . Rectal bleeding   . Right shoulder pain   . Rotator cuff syndrome   . Stroke ([place]   . Thyroid disease   . TIA (transient ischemic attack) 05/27/2017    Past Surgical History:  Procedure Laterality Date  . ABDOMINAL HYSTERECTOMY     total  . CHOLECYSTECTOMY    . COLONOSCOPY WITH PROPOFOL N/A 03/21/2018   Procedure: COLONOSCOPY WITH PROPOFOL;  Surgeon: WLucilla Lame MD;  [facility];  Service: Endoscopy;  Laterality: N/A;  . ESOPHAGOGASTRODUODENOSCOPY (EGD) WITH PROPOFOL N/A 03/21/2018   Procedure: ESOPHAGOGASTRODUODENOSCOPY (EGD) WITH PROPOFOL;  Surgeon: Lucilla Lame, MD;  Location: [facility] ENDOSCOPY;  Service: Endoscopy;  Laterality: N/A;  . JOINT REPLACEMENT    . KNEE SURGERY Left   . ROTATOR CUFF REPAIR Right   . spinal neurostimulator      Family History  Problem Relation Age of Onset  . Diabetes Mother   . Hyperlipidemia Mother   . Hypertension Mother   . COPD Mother   . CVA Mother   . Anemia Mother   . Diabetes Father   . Hyperlipidemia Father   . Hypertension  Father   . Thyroid disease Father   . Alcohol abuse Father   . Peripheral vascular disease Sister   . Hypertension Sister   . Anxiety disorder Son   . Depression Son   . Bipolar disorder Son   . Seizures Son     Social History:  reports that she has never smoked. She has never used smokeless tobacco. She reports that she does not drink alcohol or use drugs. She has been on disability since 2013 to difficulty with ambulation and fibromyalgia. She lives in [place]. She is alone today.   Participants in the patient's visit and their role in the encounter included the patient and Waymon Budge, RN today.  The intake visit was provided by Waymon Budge, RN.  Allergies:  Allergies  Allergen Reactions  . Amoxicillin Itching  . Amoxapine Itching  . Dapagliflozin     Other reaction(s): Other (See Comments) Thrush & vaginal itching  . Keflex [Cephalexin] Itching    Current Medications: Current Outpatient Medications  Medication Sig Dispense Refill  . ACCU-CHEK AVIVA PLUS test strip     . ACCU-CHEK SOFTCLIX LANCETS lancets     . acetaZOLAMIDE (DIAMOX) 125 MG tablet     . AIMOVIG 70 MG/ML SOAJ Inject 70 mg into the skin every 28 (twenty-eight) days.    . ARIPiprazole (ABILIFY) 5 MG tablet Take 1 tablet (5 mg total) by mouth daily. 90 tablet 0  . aspirin-acetaminophen-caffeine (EXCEDRIN MIGRAINE) 250-250-65 MG tablet Take 2 tablets by mouth every 8 (eight) hours as needed for headache or migraine.     Marland Kitchen atorvastatin (LIPITOR) 80 MG tablet Take 80 mg by mouth daily at 6 PM.    . Blood Glucose Monitoring Suppl (ACCU-CHEK AVIVA PLUS) w/Device KIT     . buPROPion (WELLBUTRIN) 75 MG tablet Take 1 tablet (75 mg total) by mouth daily after breakfast. 90 tablet 0  . Carboxymethylcellulose Sod PF (REFRESH PLUS) 0.5 % SOLN Apply to eye.    . Cholecalciferol (VITAMIN D3) 1000 units CAPS Take 1 capsule by mouth daily.    . clonazePAM (KLONOPIN) 0.5 MG tablet May use up to once daily as needed  for facial pain    . clotrimazole (LOTRIMIN) 1 % cream     . cyclobenzaprine (FLEXERIL) 5 MG tablet     . diclofenac (FLECTOR) 1.3 % PTCH     . diltiazem (CARDIZEM CD) 240 MG 24 hr capsule Take by mouth. Take 1 capsule by mouth once daily    . DULoxetine (CYMBALTA) 30 MG capsule Take 3 capsules (90 mg total) by mouth daily. 270 capsule 0  . esomeprazole (NEXIUM) 40 MG capsule Take 40 mg by mouth daily.    . famotidine (PEPCID) 40 MG tablet     . ferrous sulfate 325 (65 FE) MG tablet Take by mouth. Take 325 mg daily with  breakfast    . fluticasone (FLONASE) 50 MCG/ACT nasal spray     . glipiZIDE (GLUCOTROL) 5 MG tablet Take 1 tablet (5 mg total) by mouth 2 (two) times daily before a meal. 60 tablet 0  . HUMALOG KWIKPEN 100 UNIT/ML KwikPen     . hydrOXYzine (VISTARIL) 25 MG capsule TAKE (1) CAPSULE BY MOUTH TWICE DAILY ASNEEDED FOR SEVERA ANXIETY ATTACKS 180 capsule 0  . ketoconazole (NIZORAL) 2 % cream 1 application.    Marland Kitchen lamoTRIgine (LAMICTAL) 100 MG tablet Take 1 tablet (100 mg total) by mouth daily. To be combined with 25 mg 90 tablet 0  . lamoTRIgine (LAMICTAL) 25 MG tablet TAKE ONE TABLET BY MOUTH DAILY. TO BE COMBINED WITH 100 MG. 90 tablet 0  . levothyroxine (SYNTHROID, LEVOTHROID) 100 MCG tablet Take 100 mcg by mouth daily.    Marland Kitchen loratadine (CLARITIN) 10 MG tablet Take 10 mg by mouth daily as needed for allergies.    Marland Kitchen MAGNESIUM PO Take 500 mg by mouth 2 (two) times daily.    . meloxicam (MOBIC) 7.5 MG tablet Take by mouth.    . metFORMIN (GLUCOPHAGE-XR) 500 MG 24 hr tablet Take 500 mg by mouth 2 (two) times daily.     . metoprolol succinate (TOPROL-XL) 25 MG 24 hr tablet Take by mouth.    . naloxone (NARCAN) nasal spray 4 mg/0.1 mL 1 spray into nose for opioid overdose. If needed, may repeat spray in 2-3 min    . NUCYNTA ER 100 MG 12 hr tablet Take 1 tablet by mouth 2 (two) times daily.    . ondansetron (ZOFRAN) 4 MG tablet TAKE (1) TABLET BY MOUTH EVERY 8 HOURS AS NEEDED FOR NAUSEA  AND VOMITING    . ondansetron (ZOFRAN-ODT) 4 MG disintegrating tablet Take 4 mg by mouth every 8 (eight) hours as needed for nausea or vomiting.    . quinapril (ACCUPRIL) 10 MG tablet Take 10 mg by mouth daily.    . ranitidine (ZANTAC) 150 MG tablet Take 150 mg by mouth 2 (two) times daily.    . rizatriptan (MAXALT) 10 MG tablet Take 1 tab at migraine onset. May repeat in 2 hours prn.  Limit to 2 in 24 hours and to 2 days weekly.    . sitaGLIPtin (JANUVIA) 100 MG tablet Take 100 mg by mouth daily.    . traZODone (DESYREL) 100 MG tablet Take 1-1.5 tablets (100-150 mg total) by mouth at bedtime. FOR SLEEP 135 tablet 0  . TRESIBA FLEXTOUCH 200 UNIT/ML SOPN     . dabigatran (PRADAXA) 150 MG CAPS capsule Take 150 mg by mouth 2 (two) times daily.    . metoCLOPramide (REGLAN) 10 MG tablet Take 1 tablet (10 mg total) by mouth every 8 (eight) hours as needed for up to 3 days for nausea. 20 tablet 0  . pioglitazone (ACTOS) 15 MG tablet Take by mouth. Take 1 tablet by mouth once daily     No current facility-administered medications for this visit.     Review of Systems  Constitutional: Negative.  Negative for chills, diaphoresis, fever, malaise/fatigue and weight loss.       Feels "good".  Some fatigue.  HENT: Negative.  Negative for congestion, ear pain, hearing loss, nosebleeds, sinus pain and sore throat.        Allergies, rhinorrhea   Eyes: Negative.  Negative for blurred vision.  Respiratory: Positive for shortness of breath (exertional). Negative for cough and wheezing.   Cardiovascular: Negative.  Negative for chest  pain, palpitations, orthopnea, leg swelling and PND.  Gastrointestinal: Negative.  Negative for abdominal pain, blood in stool, constipation, diarrhea, melena, nausea and vomiting.  Genitourinary: Negative.  Negative for dysuria, frequency, hematuria and urgency.  Musculoskeletal: Positive for joint pain (Osteoarthritis in hips (undergoing pool therapy)) and myalgias (Fibromyalgia).  Negative for back pain.  Skin: Negative.  Negative for rash.  Neurological: Positive for headaches (chronic, on nasal injections). Negative for dizziness, tingling, sensory change and weakness.  Endo/Heme/Allergies: Does not bruise/bleed easily (on Pradaxa).       Diabetes.  Thyroid disease on Synthroid.  Psychiatric/Behavioral: Negative.  Negative for depression, memory loss and substance abuse. The patient is not nervous/anxious and does not have insomnia.   All other systems reviewed and are negative.   Performance status (ECOG): 1-2  Physical Exam  Constitutional: She appears well-developed and well-nourished. No distress.  HENT:  Blonde hair.  Face full.  Eyes: Conjunctivae and EOM are normal.  Glasses.  Neurological: She is alert.  Psychiatric: She has a normal mood and affect. Her behavior is normal. Judgment and thought content normal.  Nursing note reviewed.    No visits with results within 3 Day(s) from this visit.  Latest known visit with results is:  Appointment on 12/07/2018  Component Date Value Ref Range Status  . Ferritin 12/07/2018 88  11 - 307 ng/mL Final   Performed at [facility], [address] [place], [place] [address]  . WBC 12/07/2018 7.7  4.0 - 10.5 K/uL Final  . RBC 12/07/2018 4.89  3.87 - 5.11 MIL/uL Final  . Hemoglobin 12/07/2018 14.5  12.0 - 15.0 g/dL Final  . HCT 12/07/2018 43.9  36.0 - 46.0 % Final  . MCV 12/07/2018 89.8  80.0 - 100.0 fL Final  . MCH 12/07/2018 29.7  26.0 - 34.0 pg Final  . MCHC 12/07/2018 33.0  30.0 - 36.0 g/dL Final  . RDW 12/07/2018 13.1  11.5 - 15.5 % Final  . Platelets 12/07/2018 211  150 - 400 K/uL Final  . nRBC 12/07/2018 0.0  0.0 - 0.2 % Final  . Neutrophils Relative % 12/07/2018 58  % Final  . Neutro Abs 12/07/2018 4.5  1.7 - 7.7 K/uL Final  . Lymphocytes Relative 12/07/2018 33  % Final  . Lymphs Abs 12/07/2018 2.5  0.7 - 4.0 K/uL Final  . Monocytes Relative 12/07/2018 8  % Final  . Monocytes Absolute  12/07/2018 0.6  0.1 - 1.0 K/uL Final  . Eosinophils Relative 12/07/2018 1  % Final  . Eosinophils Absolute 12/07/2018 0.1  0.0 - 0.5 K/uL Final  . Basophils Relative 12/07/2018 0  % Final  . Basophils Absolute 12/07/2018 0.0  0.0 - 0.1 K/uL Final  . Immature Granulocytes 12/07/2018 0  % Final  . Abs Immature Granulocytes 12/07/2018 0.02  0.00 - 0.07 K/uL Final   Performed at [facility], [address], [place], [place] [address]    Assessment:  Christine Mcneil is a 53 y.o. female with iron deficiency anemia of unclear etiology.  She denies any melena, hematochezia, hematuria or melena.  Diet is fairly good.  Work-up on 01/12/2018 revealed a hematocrit of 38.2, hemoglobin 11.8, MCV 73.1, platelets 320,000, and white count 8100.  Reticulocyte count was 1.4%.  Ferritin was 10.  Iron studies included a saturation of 55% and a TIBC of 576.  Normal studies included:  B12, folate, SPEP, intrinsic factor antibodies, and anti-parietal antibodies.  She received Venofer weekly x 4 (01/20/2018 -  02/09/2018), 03/14/2018, and 10/10/2018.  Ferritin has been followed:  10 on 01/12/2018, 7 on 01/18/2018, 48 on 03/07/2018, 53 on 06/07/2018, 17 on 09/06/2018, 17 on 10/05/2018, and 88 on 12/07/2018.  EGD on 03/21/2018 was normal.  Colonoscopy on 03/21/2018 revealed for 1 to 2 mm polyps in the sigmoid colon and internal hemorrhoids.  Pathology revealed hyperplastic polyps.  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  She has a prothrombin gene mutation.  She is on long term anticoagulation with Pradaxa.  Symptomatically, she feels "good".  She has some fatigue.  Plan: 1.  Review interim labs from 10/05/2018 and 12/07/2018. 2.   Iron deficiency anemia             Hemoglobin 14.5.  MCV 89.8.             Ferritin 88.             Urinalysis revealed no hematuria.    Etiology of iron deficiency remains unclear.              Consider capsule study.             No Venofer  needed today. 3.   RTC in 3 month for labs (CBC, ferritin, iron studies). 4.   RTC in 6 months for MD assessment, labs (CBC with diff, ferritin- day before), and +/- Venofer.  I discussed the assessment and treatment plan with the patient.  The patient was provided an opportunity to ask questions and all were answered.  The patient agreed with the plan and demonstrated an understanding of the instructions.  The patient was advised to call back or seek an in person evaluation if the symptoms worsen or if the condition fails to improve as anticipated.  I provided 9 minutes (12:22 PM - 12:31 PM) of face-to-face video visit time during this this encounter and > 50% was spent counseling as documented under my assessment and plan.  I provided these services from the [facility] office.   Nolon Stalls, MD, PhD  12/13/2018, 12:22 PM  I, Molly Dorshimer, am acting as Education administrator for [employer]. Mike Gip, MD, PhD.  I, Melissa C. Mike Gip, MD, have reviewed the above documentation for accuracy and completeness, and I agree with the above.

## 2018-12-13 ENCOUNTER — Inpatient Hospital Stay (HOSPITAL_BASED_OUTPATIENT_CLINIC_OR_DEPARTMENT_OTHER): Payer: Medicare Other | Admitting: Hematology and Oncology

## 2018-12-13 ENCOUNTER — Other Ambulatory Visit: Payer: Self-pay

## 2018-12-13 ENCOUNTER — Inpatient Hospital Stay: Payer: Medicare Other

## 2018-12-13 ENCOUNTER — Encounter: Payer: Self-pay | Admitting: Hematology and Oncology

## 2018-12-13 DIAGNOSIS — D508 Other iron deficiency anemias: Secondary | ICD-10-CM

## 2018-12-13 NOTE — Progress Notes (Signed)
Patient denies any concerns today.  

## 2019-01-05 ENCOUNTER — Other Ambulatory Visit: Payer: Self-pay | Admitting: Psychiatry

## 2019-01-05 DIAGNOSIS — F4310 Post-traumatic stress disorder, unspecified: Secondary | ICD-10-CM

## 2019-01-05 DIAGNOSIS — F331 Major depressive disorder, recurrent, moderate: Secondary | ICD-10-CM

## 2019-01-16 ENCOUNTER — Ambulatory Visit (INDEPENDENT_AMBULATORY_CARE_PROVIDER_SITE_OTHER): Payer: Medicare Other | Admitting: Psychiatry

## 2019-01-16 ENCOUNTER — Other Ambulatory Visit: Payer: Self-pay

## 2019-01-16 ENCOUNTER — Encounter: Payer: Self-pay | Admitting: Psychiatry

## 2019-01-16 DIAGNOSIS — F411 Generalized anxiety disorder: Secondary | ICD-10-CM

## 2019-01-16 DIAGNOSIS — F4310 Post-traumatic stress disorder, unspecified: Secondary | ICD-10-CM

## 2019-01-16 DIAGNOSIS — F419 Anxiety disorder, unspecified: Secondary | ICD-10-CM

## 2019-01-16 DIAGNOSIS — F331 Major depressive disorder, recurrent, moderate: Secondary | ICD-10-CM | POA: Diagnosis not present

## 2019-01-16 DIAGNOSIS — F5105 Insomnia due to other mental disorder: Secondary | ICD-10-CM

## 2019-01-16 MED ORDER — TRAZODONE HCL 150 MG PO TABS: 150.0000 mg | ORAL_TABLET | Freq: Every day | ORAL | 1 refills | Status: DC

## 2019-01-16 MED ORDER — HYDROXYZINE PAMOATE 25 MG PO CAPS: ORAL_CAPSULE | ORAL | 0 refills | Status: DC

## 2019-01-16 MED ORDER — LAMOTRIGINE 100 MG PO TABS: 100.0000 mg | ORAL_TABLET | Freq: Every day | ORAL | 0 refills | Status: DC

## 2019-01-16 MED ORDER — LAMOTRIGINE 25 MG PO TABS: ORAL_TABLET | ORAL | 0 refills | Status: DC

## 2019-01-16 MED ORDER — DULOXETINE HCL 30 MG PO CPEP: 90.0000 mg | ORAL_CAPSULE | Freq: Every day | ORAL | 0 refills | Status: DC

## 2019-01-16 MED ORDER — ARIPIPRAZOLE 5 MG PO TABS: 5.0000 mg | ORAL_TABLET | Freq: Every day | ORAL | 0 refills | Status: DC

## 2019-01-16 NOTE — Progress Notes (Signed)
Virtual Visit via Telephone Note  I connected with Christine Mcneil on 01/16/19 at  4:30 PM EDT by telephone and verified that I am speaking with the correct person using two identifiers.   I discussed the limitations, risks, security and privacy concerns of performing an evaluation and management service by telephone and the availability of in person appointments. I also discussed with the patient that there may be a patient responsible charge related to this service. The patient expressed understanding and agreed to proceed.    I discussed the assessment and treatment plan with the patient. The patient was provided an opportunity to ask questions and all were answered. The patient agreed with the plan and demonstrated an understanding of the instructions.   The patient was advised to call back or seek an in-person evaluation if the symptoms worsen or if the condition fails to improve as anticipated.  [place] MD OP Progress Note  01/16/2019 5:44 PM ASSIA MEANOR  MRN:  [id number]  Chief Complaint:  Chief Complaint    Follow-up     HPI: Christine Mcneil is a 53 year old Caucasian female, divorced, lives in [place], on [place], has a history of MDD, PTSD, insomnia, OSA-resolved, history of CVA, history of prothrombin gene mutation, chronic pain, migraine headache, iron deficiency was evaluated by phone today.  Patient preferred to do a phone call.  Patient today reports she is currently doing well on the current medication regimen.  She denies any significant side effects.  She is compliant on her medications as prescribed.  She denies any sadness or anxiety symptoms.  She reports sleep is improved.  Patient reports she continues to worry about the current situational stressors however she has been coping okay.  She denies any other concerns today. Visit Diagnosis:    ICD-10-CM   1. MDD (major depressive disorder), recurrent episode, moderate (HCC)  F33.1 traZODone (DESYREL) 150 MG tablet  2. PTSD  (post-traumatic stress disorder)  F43.10 traZODone (DESYREL) 150 MG tablet  3. GAD (generalized anxiety disorder)  F41.1   4. Insomnia due to mental disorder  F51.05 traZODone (DESYREL) 150 MG tablet    Past Psychiatric History: I have reviewed past psychiatric history from my progress note on 08/16/2017.  Past trials of Effexor, Klonopin.  Past Medical History:  Past Medical History:  Diagnosis Date  . Abdominal pain   . Anxiety and depression   . Atypical facial pain   . Bilateral occipital neuralgia   . Cervical spondylosis without myelopathy   . Chronic daily headache   . Chronic pain   . Chronic prostatitis   . Chronic thoracic back pain   . Depression   . Diabetes mellitus without complication ([place])   . Diabetes mellitus, type II ([place])   . Dysphagia   . Dysrhythmia   . Fibromyalgia   . Hyperlipidemia   . Hypertension   . Insomnia   . Iron deficiency anemia   . Left hip pain   . Low back pain   . Migraines   . Numbness   . Optic neuropathy   . OSA (obstructive sleep apnea)   . Polyarthralgia   . Primary osteoarthritis of both hips   . Prothrombin gene mutation ([place])   . Pseudotumor cerebri   . Rectal bleeding   . Right shoulder pain   . Rotator cuff syndrome   . Stroke ([place])   . Thyroid disease   . TIA (transient ischemic attack) 05/27/2017    Past Surgical History:  Procedure Laterality Date  .  ABDOMINAL HYSTERECTOMY     total  . CHOLECYSTECTOMY    . COLONOSCOPY WITH PROPOFOL N/A 03/21/2018   Procedure: COLONOSCOPY WITH PROPOFOL;  Surgeon: Lucilla Lame, MD;  Location: [facility] ENDOSCOPY;  Service: Endoscopy;  Laterality: N/A;  . ESOPHAGOGASTRODUODENOSCOPY (EGD) WITH PROPOFOL N/A 03/21/2018   Procedure: ESOPHAGOGASTRODUODENOSCOPY (EGD) WITH PROPOFOL;  Surgeon: Lucilla Lame, MD;  Location: [facility] ENDOSCOPY;  Service: Endoscopy;  Laterality: N/A;  . JOINT REPLACEMENT    . KNEE SURGERY Left   . ROTATOR CUFF REPAIR Right   . spinal neurostimulator      Family  Psychiatric History: I have reviewed family psychiatric history from my progress note on 08/16/2017.  Family History:  Family History  Problem Relation Age of Onset  . Diabetes Mother   . Hyperlipidemia Mother   . Hypertension Mother   . COPD Mother   . CVA Mother   . Anemia Mother   . Diabetes Father   . Hyperlipidemia Father   . Hypertension Father   . Thyroid disease Father   . Alcohol abuse Father   . Peripheral vascular disease Sister   . Hypertension Sister   . Anxiety disorder Son   . Depression Son   . Bipolar disorder Son   . Seizures Son     Social History: I have reviewed social history from my progress note on 08/16/2017. Social History   Socioeconomic History  . Marital status: Divorced    Spouse name: Not on file  . Number of children: 1  . Years of education: Not on file  . Highest education level: High school graduate  Occupational History    Comment: disablitiy  Social Needs  . Financial resource strain: Not hard at all  . Food insecurity    Worry: Never true    Inability: Never true  . Transportation needs    Medical: No    Non-medical: No  Tobacco Use  . Smoking status: Never Smoker  . Smokeless tobacco: Never Used  Substance and Sexual Activity  . Alcohol use: No  . Drug use: No  . Sexual activity: Not Currently  Lifestyle  . Physical activity    Days per week: 0 days    Minutes per session: 0 min  . Stress: Very much  Relationships  . Social Herbalist on phone: Once a week    Gets together: Never    Attends religious service: More than 4 times per year    Active member of club or organization: No    Attends meetings of clubs or organizations: Never    Relationship status: Divorced  Other Topics Concern  . Not on file  Social History Narrative  . Not on file    Allergies:  Allergies  Allergen Reactions  . Amoxicillin Itching  . Amoxapine Itching  . Dapagliflozin     Other reaction(s): Other (See Comments) Thrush  & vaginal itching  . Keflex [Cephalexin] Itching    Metabolic Disorder Labs: Lab Results  Component Value Date   HGBA1C 9.9 (H) 06/21/2017   MPG 237.43 06/21/2017   Lab Results  Component Value Date   PROLACTIN 5.5 09/06/2017   Lab Results  Component Value Date   CHOL 277 (H) 06/21/2017   TRIG 196 (H) 06/21/2017   HDL 64 06/21/2017   CHOLHDL 4.3 06/21/2017   VLDL 39 06/21/2017   LDLCALC 174 (H) 06/21/2017   Lab Results  Component Value Date   TSH 2.890 09/06/2017   TSH 3.176 01/04/2017  Therapeutic Level Labs: No results found for: LITHIUM No results found for: VALPROATE No components found for:  CBMZ  Current Medications: Current Outpatient Medications  Medication Sig Dispense Refill  . ACCU-CHEK AVIVA PLUS test strip     . ACCU-CHEK SOFTCLIX LANCETS lancets     . acetaZOLAMIDE (DIAMOX) 125 MG tablet     . AIMOVIG 70 MG/ML SOAJ Inject 70 mg into the skin every 28 (twenty-eight) days.    . ARIPiprazole (ABILIFY) 5 MG tablet Take 1 tablet (5 mg total) by mouth daily. 90 tablet 0  . aspirin-acetaminophen-caffeine (EXCEDRIN MIGRAINE) 250-250-65 MG tablet Take 2 tablets by mouth every 8 (eight) hours as needed for headache or migraine.     Marland Kitchen atorvastatin (LIPITOR) 80 MG tablet Take 80 mg by mouth daily at 6 PM.    . Blood Glucose Monitoring Suppl (ACCU-CHEK AVIVA PLUS) w/Device KIT     . buPROPion (WELLBUTRIN) 75 MG tablet Take 1 tablet (75 mg total) by mouth daily after breakfast. 90 tablet 0  . Carboxymethylcellulose Sod PF (REFRESH PLUS) 0.5 % SOLN Apply to eye.    . Cholecalciferol (VITAMIN D3) 1000 units CAPS Take 1 capsule by mouth daily.    . clonazePAM (KLONOPIN) 0.5 MG tablet May use up to once daily as needed for facial pain    . clotrimazole (LOTRIMIN) 1 % cream     . cyclobenzaprine (FLEXERIL) 5 MG tablet     . dabigatran (PRADAXA) 150 MG CAPS capsule Take 150 mg by mouth 2 (two) times daily.    . diclofenac (FLECTOR) 1.3 % PTCH     . diltiazem (CARDIZEM  CD) 240 MG 24 hr capsule Take by mouth. Take 1 capsule by mouth once daily    . DULoxetine (CYMBALTA) 30 MG capsule Take 3 capsules (90 mg total) by mouth daily. 270 capsule 0  . esomeprazole (NEXIUM) 40 MG capsule Take 40 mg by mouth daily.    . famotidine (PEPCID) 40 MG tablet     . ferrous sulfate 325 (65 FE) MG tablet Take by mouth. Take 325 mg daily with breakfast    . fluticasone (FLONASE) 50 MCG/ACT nasal spray     . glipiZIDE (GLUCOTROL) 5 MG tablet Take 1 tablet (5 mg total) by mouth 2 (two) times daily before a meal. 60 tablet 0  . HUMALOG KWIKPEN 100 UNIT/ML KwikPen     . hydrOXYzine (VISTARIL) 25 MG capsule TAKE (1) CAPSULE BY MOUTH TWICE DAILY ASNEEDED FOR SEVERA ANXIETY ATTACKS 180 capsule 0  . ketoconazole (NIZORAL) 2 % cream 1 application.    Marland Kitchen lamoTRIgine (LAMICTAL) 100 MG tablet Take 1 tablet (100 mg total) by mouth daily. To be combined with 25 mg 90 tablet 0  . lamoTRIgine (LAMICTAL) 25 MG tablet TAKE ONE TABLET BY MOUTH DAILY. TO BE COMBINED WITH 100 MG. 90 tablet 0  . levothyroxine (SYNTHROID, LEVOTHROID) 100 MCG tablet Take 100 mcg by mouth daily.    Marland Kitchen loratadine (CLARITIN) 10 MG tablet Take 10 mg by mouth daily as needed for allergies.    Marland Kitchen MAGNESIUM PO Take 500 mg by mouth 2 (two) times daily.    . meloxicam (MOBIC) 7.5 MG tablet Take by mouth.    . metFORMIN (GLUCOPHAGE-XR) 500 MG 24 hr tablet Take 500 mg by mouth 2 (two) times daily.     . metoCLOPramide (REGLAN) 10 MG tablet Take 1 tablet (10 mg total) by mouth every 8 (eight) hours as needed for up to 3 days for nausea. [place]  tablet 0  . metoprolol succinate (TOPROL-XL) 25 MG 24 hr tablet Take by mouth.    . naloxone (NARCAN) nasal spray 4 mg/0.1 mL 1 spray into nose for opioid overdose. If needed, may repeat spray in 2-3 min    . NUCYNTA ER 100 MG 12 hr tablet Take 1 tablet by mouth 2 (two) times daily.    . ondansetron (ZOFRAN) 4 MG tablet TAKE (1) TABLET BY MOUTH EVERY 8 HOURS AS NEEDED FOR NAUSEA AND VOMITING    .  ondansetron (ZOFRAN-ODT) 4 MG disintegrating tablet Take 4 mg by mouth every 8 (eight) hours as needed for nausea or vomiting.    . pioglitazone (ACTOS) 15 MG tablet Take by mouth. Take 1 tablet by mouth once daily    . quinapril (ACCUPRIL) 10 MG tablet Take 10 mg by mouth daily.    . ranitidine (ZANTAC) 150 MG tablet Take 150 mg by mouth 2 (two) times daily.    . rizatriptan (MAXALT) 10 MG tablet Take 1 tab at migraine onset. May repeat in 2 hours prn.  Limit to 2 in 24 hours and to 2 days weekly.    . sitaGLIPtin (JANUVIA) 100 MG tablet Take 100 mg by mouth daily.    . traZODone (DESYREL) 150 MG tablet Take 1 tablet (150 mg total) by mouth at bedtime. 90 tablet 1  . TRESIBA FLEXTOUCH 200 UNIT/ML SOPN      No current facility-administered medications for this visit.      Musculoskeletal: Strength & Muscle Tone: UTA Gait & Station: Reports as WNL Patient leans: N/A  Psychiatric Specialty Exam: Review of Systems  Psychiatric/Behavioral: Negative for hallucinations. The patient is not nervous/anxious.   All other systems reviewed and are negative.   There were no vitals taken for this visit.There is no height or weight on file to calculate BMI.  General Appearance: UTA  Eye Contact:  UTA  Speech:  Clear and Coherent  Volume:  Normal  Mood:  Euthymic  Affect:  Congruent  Thought Process:  Goal Directed and Descriptions of Associations: Intact  Orientation:  Full (Time, Place, and Person)  Thought Content: Logical   Suicidal Thoughts:  No  Homicidal Thoughts:  No  Memory:  Immediate;   Fair Recent;   Fair Remote;   Fair  Judgement:  Fair  Insight:  Fair  Psychomotor Activity:  UTA  Concentration:  Concentration: Fair and Attention Span: Fair  Recall:  [employer] of Knowledge: Fair  Language: Fair  Akathisia:  No  Handed:  Right  AIMS (if indicated): Denies tremors, rigidity  Assets:  Communication Skills Desire for Improvement Housing  ADL's:  Intact  Cognition: WNL   Sleep:  Fair   Screenings:   Assessment and Plan: Kadince is a 53 year old Caucasian female who has a history of MDD, PTSD, chronic pain, migraine headaches, history of CVA, iron deficiency, prothrombin gene mutation was evaluated by telemedicine today.  Patient is currently doing well on the current medication regimen.  Plan MDD- stable Cymbalta 90 mg p.o. daily Lamotrigine 125 mg p.o. daily Abilify 5 mg p.o. daily Wellbutrin at reduced dosage of 75 mg p.o. daily.   For GAD-stable Hydroxyzine 25 mg p.o. twice daily as needed Continue psychotherapy sessions  PTSD- stable Continue CBT.  For insomnia-improving Trazodone 150 mg p.o. nightly  Follow-up in clinic in 2 to 3 months or sooner if needed.  November 19 at 2:30 PM  I have spent atleast 15 minutes non  face to face with  patient today. More than 50 % of the time was spent for psychoeducation and supportive psychotherapy and care coordination. This note was generated in part or whole with voice recognition software. Voice recognition is usually quite accurate but there are transcription errors that can and very often do occur. I apologize for any typographical errors that were not detected and corrected.      Ursula Alert, MD 01/16/2019, 5:44 PM

## 2019-01-30 ENCOUNTER — Other Ambulatory Visit: Payer: Self-pay

## 2019-01-30 ENCOUNTER — Ambulatory Visit (LOCAL_COMMUNITY_HEALTH_CENTER): Payer: Medicare Other

## 2019-01-30 DIAGNOSIS — Z111 Encounter for screening for respiratory tuberculosis: Secondary | ICD-10-CM

## 2019-02-02 ENCOUNTER — Ambulatory Visit (LOCAL_COMMUNITY_HEALTH_CENTER): Payer: Medicare Other

## 2019-02-02 ENCOUNTER — Other Ambulatory Visit: Payer: Medicare Other

## 2019-02-02 ENCOUNTER — Other Ambulatory Visit: Payer: Self-pay

## 2019-02-02 DIAGNOSIS — Z111 Encounter for screening for respiratory tuberculosis: Secondary | ICD-10-CM

## 2019-02-02 LAB — TB SKIN TEST
Induration: 0 mm
TB Skin Test: NEGATIVE

## 2019-02-21 DIAGNOSIS — M5416 Radiculopathy, lumbar region: Secondary | ICD-10-CM | POA: Insufficient documentation

## 2019-02-23 ENCOUNTER — Other Ambulatory Visit: Payer: Self-pay | Admitting: Psychiatry

## 2019-02-23 ENCOUNTER — Other Ambulatory Visit: Payer: Self-pay | Admitting: Physician Assistant

## 2019-02-23 DIAGNOSIS — F331 Major depressive disorder, recurrent, moderate: Secondary | ICD-10-CM

## 2019-02-23 DIAGNOSIS — R070 Pain in throat: Secondary | ICD-10-CM

## 2019-02-27 ENCOUNTER — Ambulatory Visit: Payer: Medicare Other | Admitting: Orthopaedic Surgery

## 2019-03-01 ENCOUNTER — Other Ambulatory Visit
Admission: RE | Admit: 2019-03-01 | Discharge: 2019-03-01 | Disposition: A | Discharge status: Home / Self Care | Payer: Medicare Other | Source: Home / Self Care | Attending: Physician Assistant | Admitting: Physician Assistant

## 2019-03-01 ENCOUNTER — Other Ambulatory Visit: Payer: Self-pay

## 2019-03-01 ENCOUNTER — Ambulatory Visit
Admission: RE | Admit: 2019-03-01 | Discharge: 2019-03-01 | Disposition: A | Discharge status: Home / Self Care | Payer: Medicare Other | Source: Ambulatory Visit | Attending: Physician Assistant | Admitting: Physician Assistant

## 2019-03-01 DIAGNOSIS — R070 Pain in throat: Secondary | ICD-10-CM

## 2019-03-01 LAB — CREATININE, SERUM
Creatinine, Ser: 0.96 mg/dL (ref 0.44–1.00)
GFR calc Af Amer: >60 mL/min (ref >60)
GFR calc non Af Amer: >60 mL/min (ref >60)

## 2019-03-01 MED ORDER — IOHEXOL 300 MG/ML  SOLN
75.0000 mL | Freq: Once | INTRAMUSCULAR | Status: AC | PRN
  Administered 2019-03-01: 75 mL via INTRAVENOUS

## 2019-03-07 ENCOUNTER — Other Ambulatory Visit: Payer: Self-pay

## 2019-03-07 ENCOUNTER — Encounter: Payer: Self-pay | Admitting: Emergency Medicine

## 2019-03-07 ENCOUNTER — Emergency Department: Payer: Medicare Other

## 2019-03-07 DIAGNOSIS — Z79899 Other long term (current) drug therapy: Secondary | ICD-10-CM | POA: Insufficient documentation

## 2019-03-07 DIAGNOSIS — J019 Acute sinusitis, unspecified: Secondary | ICD-10-CM | POA: Diagnosis not present

## 2019-03-07 DIAGNOSIS — E119 Type 2 diabetes mellitus without complications: Secondary | ICD-10-CM | POA: Diagnosis not present

## 2019-03-07 DIAGNOSIS — E039 Hypothyroidism, unspecified: Secondary | ICD-10-CM | POA: Insufficient documentation

## 2019-03-07 DIAGNOSIS — Z20828 Contact with and (suspected) exposure to other viral communicable diseases: Secondary | ICD-10-CM | POA: Insufficient documentation

## 2019-03-07 DIAGNOSIS — J209 Acute bronchitis, unspecified: Secondary | ICD-10-CM | POA: Diagnosis not present

## 2019-03-07 DIAGNOSIS — Z794 Long term (current) use of insulin: Secondary | ICD-10-CM | POA: Diagnosis not present

## 2019-03-07 DIAGNOSIS — I10 Essential (primary) hypertension: Secondary | ICD-10-CM | POA: Diagnosis not present

## 2019-03-07 DIAGNOSIS — R05 Cough: Secondary | ICD-10-CM | POA: Diagnosis present

## 2019-03-07 NOTE — ED Triage Notes (Signed)
Patient ambulatory to triage with steady gait, without difficulty or distress noted, mask in place; pt reports prod cough brown sputum x 2 days with sinus drainage; denies fever; st hx bronchitis, nonsmoker

## 2019-03-08 ENCOUNTER — Emergency Department
Admission: EM | Admit: 2019-03-08 | Discharge: 2019-03-08 | Disposition: A | Discharge status: Home / Self Care | Payer: Medicare Other | Attending: Emergency Medicine | Admitting: Emergency Medicine

## 2019-03-08 DIAGNOSIS — J209 Acute bronchitis, unspecified: Secondary | ICD-10-CM

## 2019-03-08 DIAGNOSIS — J019 Acute sinusitis, unspecified: Secondary | ICD-10-CM

## 2019-03-08 LAB — SARS CORONAVIRUS 2 (TAT 6-24 HRS): SARS Coronavirus 2: NEGATIVE

## 2019-03-08 MED ORDER — DOXYCYCLINE HYCLATE 100 MG PO TABS
100.0000 mg | ORAL_TABLET | Freq: Once | ORAL | Status: AC
  Administered 2019-03-08: 100 mg via ORAL
  Filled 2019-03-08: qty 1

## 2019-03-08 MED ORDER — BENZONATATE 100 MG PO CAPS: 100.0000 mg | ORAL_CAPSULE | Freq: Three times a day (TID) | ORAL | 0 refills | Status: DC | PRN

## 2019-03-08 MED ORDER — ALBUTEROL SULFATE HFA 108 (90 BASE) MCG/ACT IN AERS: 2.0000 | INHALATION_SPRAY | Freq: Four times a day (QID) | RESPIRATORY_TRACT | 0 refills | Status: AC | PRN

## 2019-03-08 MED ORDER — DOXYCYCLINE HYCLATE 100 MG PO CAPS: 100.0000 mg | ORAL_CAPSULE | Freq: Two times a day (BID) | ORAL | 0 refills | Status: AC

## 2019-03-08 MED ORDER — ALBUTEROL SULFATE (2.5 MG/3ML) 0.083% IN NEBU
2.5000 mg | INHALATION_SOLUTION | Freq: Once | RESPIRATORY_TRACT | Status: AC
  Administered 2019-03-08: 2.5 mg via RESPIRATORY_TRACT
  Filled 2019-03-08: qty 3

## 2019-03-08 MED ORDER — BENZONATATE 100 MG PO CAPS
100.0000 mg | ORAL_CAPSULE | Freq: Once | ORAL | Status: AC
  Administered 2019-03-08: 100 mg via ORAL
  Filled 2019-03-08: qty 1

## 2019-03-08 MED ORDER — DOXYCYCLINE HYCLATE 100 MG PO TABS: 100.0000 mg | ORAL_TABLET | Freq: Two times a day (BID) | ORAL | Status: DC

## 2019-03-08 NOTE — ED Provider Notes (Signed)
[facility] Provider Note _   First MD Initiated Contact with Patient 03/08/19 0122     (approximate)  I have reviewed the triage vital signs and the nursing notes.   HISTORY  Chief Complaint No chief complaint on file.   HPI Christine Mcneil is a 53 y.o. female with below list of previous medical conditions presents to the emergency department secondary to nasal congestion, productive cough with Christine Mcneil sputum x1 week.  Patient admits to multiple sick family members with the same.  She states that they were all tested for COVID and were negative.  Patient denies any fever afebrile on presentation.  No loss of taste or smell.  No vomiting or diarrhea.  Patient states that she has had bronchitis in the past chart review revealed this is accurate.  Patient states that she was prescribed azithromycin which she completed a course without improvement of symptoms.        Past Medical History:  Diagnosis Date  . Abdominal pain   . Anxiety and depression   . Atypical facial pain   . Bilateral occipital neuralgia   . Cervical spondylosis without myelopathy   . Chronic daily headache   . Chronic pain   . Chronic prostatitis   . Chronic thoracic back pain   . Depression   . Diabetes mellitus without complication ([place])   . Diabetes mellitus, type II ([place])   . Dysphagia   . Dysrhythmia   . Fibromyalgia   . Hyperlipidemia   . Hypertension   . Insomnia   . Iron deficiency anemia   . Left hip pain   . Low back pain   . Migraines   . Numbness   . Optic neuropathy   . OSA (obstructive sleep apnea)   . Polyarthralgia   . Primary osteoarthritis of both hips   . Prothrombin gene mutation ([place])   . Pseudotumor cerebri   . Rectal bleeding   . Right shoulder pain   . Rotator cuff syndrome   . Stroke ([place])   . Thyroid disease   . TIA (transient ischemic attack) 05/27/2017    Patient Active Problem List   Diagnosis Date Noted  . MDD (major  depressive disorder), recurrent episode, moderate ([place]) 11/28/2018  . PTSD (post-traumatic stress disorder) 11/28/2018  . GAD (generalized anxiety disorder) 11/28/2018  . Insomnia due to mental disorder 11/28/2018  . Severe obesity (BMI >= 40) ([place]) 11/27/2018  . Polyp of sigmoid colon   . Iron deficiency anemia 01/19/2018  . 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 TIA (transient ischemic attack) 06/20/2017  . Primary osteoarthritis of both hips 06/20/2017  . Periodic limb movements of sleep 05/30/2017  . Shortness of breath 01/03/2017  . 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 Chronic prostatitis/chronic pelvic pain syndrome 05/28/2016  . Migraine without aura and without status migrainosus, not intractable 05/11/2016  . Chronic anticoagulation 03/29/2016  . Encounter for monitoring opioid maintenance therapy 11/04/2015  . Dysphagia, neurologic 09/16/2015  . Numbness 09/16/2015  . Anti-cardiolipin antibody positive 09/01/2015  . 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 History of cerebral venous sinus thrombosis 06/27/2015  . Anemia, iron deficiency 10/02/2014  . Chronic thoracic back pain 07/24/2014  . Bilateral occipital neuralgia 04/11/2014  . Hypothyroidism, unspecified 10/17/2013  . Type 2 diabetes, HbA1c goal < 7% ([facility]) 10/17/2013  . Cervicogenic headache 07/04/2013  . Cervical spondylosis without myelopathy 11/16/2012  . Rotator cuff syndrome 11/13/2012  .  Sinus congestion 11/07/2012  . Abdominal pain 09/28/2012  . Rectal bleeding 09/28/2012  . Depression 02/22/2012  . GERD (gastroesophageal reflux disease) 02/22/2012  . Essential hypertension 02/22/2012  . Obesity, unspecified 02/22/2012  . Neuromuscular disorder ([place]) 02/22/2012  . Pain in joint, pelvic region and thigh 01/17/2012  . Insomnia secondary to chronic pain 10/04/2011  . Right shoulder pain 10/04/2011  . Atypical facial pain 10/03/2011  . Chronic daily headache  10/03/2011  . Fibromyalgia 10/03/2011  . Hyperlipidemia, unspecified 10/03/2011    Past Surgical History:  Procedure Laterality Date  . ABDOMINAL HYSTERECTOMY     total  . CHOLECYSTECTOMY    . COLONOSCOPY WITH PROPOFOL N/A 03/21/2018   Procedure: COLONOSCOPY WITH PROPOFOL;  Surgeon: Lucilla Lame, MD;  Location[facility] ENDOSCOPY;  Service: Endoscopy;  Laterality: N/A;  . ESOPHAGOGASTRODUODENOSCOPY (EGD) WITH PROPOFOL N/A 03/21/2018   Procedure: ESOPHAGOGASTRODUODENOSCOPY (EGD) WITH PROPOFOL;  Surgeon: Lucilla Lame, MD;  Location: [facility] ENDOSCOPY;  Service: Endoscopy;  Laterality: N/A;  . JOINT REPLACEMENT    . KNEE SURGERY Left   . ROTATOR CUFF REPAIR Right   . spinal neurostimulator      Prior to Admission medications   Medication Sig Start Date End Date Taking? Authorizing Provider  ACCU-CHEK AVIVA PLUS test strip  03/27/18   [provider]  ACCU-CHEK SOFTCLIX LANCETS lancets  11/07/17   [provider]  acetaZOLAMIDE (DIAMOX) 125 MG tablet  11/25/17   [provider]  AIMOVIG 70 MG/ML SOAJ Inject 70 mg into the skin every 28 (twenty-eight) days. 06/15/17   [provider]  albuterol (VENTOLIN HFA) 108 (90 Base) MCG/ACT inhaler Inhale into the lungs. 12/14/18 12/14/19  [provider]  ARIPiprazole (ABILIFY) 5 MG tablet Take 1 tablet (5 mg total) by mouth daily. 01/16/19   Ursula Alert, MD  aspirin-acetaminophen-caffeine (EXCEDRIN MIGRAINE) [phone] MG tablet Take 2 tablets by mouth every 8 (eight) hours as needed for headache or migraine.     [provider]  atorvastatin (LIPITOR) 80 MG tablet Take 80 mg by mouth daily at 6 PM. 12/22/17   [provider]  Blood Glucose Monitoring Suppl (ACCU-CHEK AVIVA PLUS) w/Device KIT  11/07/17   [provider]  buPROPion (WELLBUTRIN) 75 MG tablet TAKE (1) TABLET BY MOUTH EVERY MORNING AFTER BREAKFAST 02/23/19   Ursula Alert, MD  Carboxymethylcellulose Sod PF (REFRESH PLUS) 0.5 %  SOLN Apply to eye. 05/15/15   [provider]  Cholecalciferol (VITAMIN D3) 1000 units CAPS Take 1 capsule by mouth daily.    [provider]  clonazePAM [facility] [place]) 0.5 MG tablet May use up to once daily as needed for facial pain 12/26/17   [provider]  clotrimazole (LOTRIMIN) 1 % cream  12/05/17   [provider]  cyclobenzaprine (FLEXERIL) 5 MG tablet  12/08/17   [provider]  dabigatran (PRADAXA) 150 MG CAPS capsule Take 150 mg by mouth 2 (two) times daily. 10/12/16 09/07/18  [provider]  diclofenac (FLECTOR) 1.3 % [facility]  12/15/17   [provider]  diltiazem (CARDIZEM CD) 240 MG 24 hr capsule Take by mouth. Take 1 capsule by mouth once daily 12/26/17 12/26/18  [provider]  DULoxetine (CYMBALTA) 30 MG capsule Take 3 capsules (90 mg total) by mouth daily. 01/16/19   Ursula Alert, MD  esomeprazole (NEXIUM) 40 MG capsule Take 40 mg by mouth daily.    [provider]  famotidine (PEPCID) 40 MG tablet  04/18/18   [provider]  ferrous sulfate  325 (65 FE) MG tablet Take by mouth. Take 325 mg daily with breakfast    [provider]  fluticasone (FLONASE) 50 MCG/ACT nasal spray  06/25/17   [provider]  glipiZIDE (GLUCOTROL) 5 MG tablet Take 1 tablet (5 mg total) by mouth 2 (two) times daily before a meal. 06/22/17   Nicholes Mango, MD  HUMALOG KWIKPEN 100 UNIT/ML KwikPen  07/12/18   [provider]  hydrOXYzine (VISTARIL) 25 MG capsule TAKE (1) CAPSULE BY MOUTH TWICE DAILY ASNEEDED FOR SEVERA ANXIETY ATTACKS 01/16/19   Ursula Alert, MD  ketoconazole (NIZORAL) 2 % cream 1 application. 12/13/12   [provider]  lamoTRIgine (LAMICTAL) 100 MG tablet Take 1 tablet (100 mg total) by mouth daily. To be combined with 25 mg 01/16/19   Ursula Alert, MD  lamoTRIgine (LAMICTAL) 25 MG tablet TAKE ONE TABLET BY MOUTH DAILY. TO BE COMBINED WITH 100 MG. 01/16/19   Eappen, Ria Clock, MD   levothyroxine (SYNTHROID, LEVOTHROID) 100 MCG tablet Take 100 mcg by mouth daily. 08/18/15   [provider]  loratadine (CLARITIN) 10 MG tablet Take 10 mg by mouth daily as needed for allergies.    [provider]  MAGNESIUM PO Take 500 mg by mouth 2 (two) times daily.    [provider]  meloxicam (MOBIC) 7.5 MG tablet Take by mouth. 09/22/18 09/22/19  [provider]  metFORMIN (GLUCOPHAGE-XR) 500 MG 24 hr tablet Take 500 mg by mouth 2 (two) times daily.  02/17/18   [provider]  metoCLOPramide (REGLAN) 10 MG tablet Take 1 tablet (10 mg total) by mouth every 8 (eight) hours as needed for up to 3 days for nausea. 06/05/17 06/08/17  Rudene Re, MD  metoprolol succinate (TOPROL-XL) 25 MG 24 hr tablet Take by mouth. 06/05/18 06/05/19  [provider]  naloxone [facility]) nasal spray 4 mg/0.1 mL 1 spray into nose for opioid overdose. If needed, may repeat spray in 2-3 min 03/14/18   [provider]  NUCYNTA ER 100 MG 12 hr tablet Take 1 tablet by mouth 2 (two) times daily. 05/30/17   [provider]  ondansetron (ZOFRAN) 4 MG tablet TAKE (1) TABLET BY MOUTH EVERY 8 HOURS AS NEEDED FOR NAUSEA AND VOMITING 07/20/18   [provider]  ondansetron (ZOFRAN-ODT) 4 MG disintegrating tablet Take 4 mg by mouth every 8 (eight) hours as needed for nausea or vomiting.    [provider]  pioglitazone (ACTOS) 15 MG tablet Take by mouth. Take 1 tablet by mouth once daily 11/07/17 11/07/18  [provider]  propranolol (INDERAL) 20 MG tablet  11/27/18   [provider]  quinapril (ACCUPRIL) 10 MG tablet Take 10 mg by mouth daily. 05/01/13   [provider]  ranitidine (ZANTAC) 150 MG tablet Take 150 mg by mouth 2 (two) times daily. 01/20/15   [provider]  Rimegepant Sulfate (NURTEC) 75 MG TBDP Take by mouth. 01/12/19   [provider]  rizatriptan (MAXALT) 10 MG tablet Take 1 tab at  migraine onset. May repeat in 2 hours prn.  Limit to 2 in 24 hours and to 2 days weekly. 09/09/15   [provider]  sitaGLIPtin (JANUVIA) 100 MG tablet Take 100 mg by mouth daily.    [provider]  traZODone (DESYREL) 150 MG tablet Take 1 tablet (150 mg total) by mouth at bedtime. 01/16/19   Ursula Alert, MD  TRESIBA FLEXTOUCH 200 UNIT/ML [facility]  11/22/18   [provider]  Allergies Amoxicillin, Amoxapine, Dapagliflozin, and Keflex [cephalexin]  Family History  Problem Relation Age of Onset  . Diabetes Mother   . Hyperlipidemia Mother   . Hypertension Mother   . COPD Mother   . CVA Mother   . Anemia Mother   . Diabetes Father   . Hyperlipidemia Father   . Hypertension Father   . Thyroid disease Father   . Alcohol abuse Father   . Peripheral vascular disease Sister   . Hypertension Sister   . Anxiety disorder Son   . Depression Son   . Bipolar disorder Son   . Seizures Son     Social History Social History   Tobacco Use  . Smoking status: Never Smoker  . Smokeless tobacco: Never Used  Substance Use Topics  . Alcohol use: No  . Drug use: No    Review of Systems Constitutional: No fever/chills Eyes: No visual changes. ENT: No sore throat.  Positive for nasal congestion Cardiovascular: Denies chest pain. Respiratory: Denies shortness of breath.  Positive for cough Gastrointestinal: No abdominal pain.  No nausea, no vomiting.  No diarrhea.  No constipation. Genitourinary: Negative for dysuria. Musculoskeletal: Negative for neck pain.  Negative for back pain. Integumentary: Negative for rash. Neurological: Negative for headaches, focal weakness or numbness.   ____________________________________________   PHYSICAL EXAM:  VITAL SIGNS: ED Triage Vitals  Enc Vitals Group     BP 03/07/19 2318 119/86     Pulse Rate 03/07/19 2318 77     Resp 03/07/19 2318 20     Temp 03/07/19 2318 97.9 F (36.6 C)     Temp Source 03/07/19 2318 Oral      SpO2 03/07/19 2318 95 %     Weight 03/07/19 2316 113.4 kg (250 lb)     Height 03/07/19 2316 1.676 m (5' 6" )     Head Circumference --      Peak Flow --      Pain Score 03/07/19 2316 0     Pain Loc --      Pain Edu? --      Excl. in [place]? --     Constitutional: Alert and oriented.  Eyes: Conjunctivae are normal.  Nose: Positive for congestion/rhinnorhea.  Pain with bilateral maxillary sinus palpation Mouth/Throat: Mucous membranes are moist. Neck: No stridor.  No meningeal signs.   Cardiovascular: Normal rate, regular rhythm. Good peripheral circulation. Grossly normal heart sounds. Respiratory: Normal respiratory effort.  No retractions.  Bibasilar rhonchi Gastrointestinal: Soft and nontender. No distention.  Musculoskeletal: No lower extremity tenderness nor edema. No gross deformities of extremities. Neurologic:  Normal speech and language. No gross focal neurologic deficits are appreciated.  Skin:  Skin is warm, dry and intact. Psychiatric: Mood and affect are normal. Speech and behavior are normal.  ____________________________________________   LABS (all labs ordered are listed, but only abnormal results are displayed)  Labs Reviewed  SARS CORONAVIRUS 2 (TAT 6-24 HRS)     RADIOLOGY I, Ririe N Zhanna Melin, personally viewed and evaluated these images (plain radiographs) as part of my medical decision making, as well as reviewing the written report by the radiologist.  ED MD interpretation: No active cardiopulmonary disease noted on chest x-ray per radiologist.  Official radiology report(s): Dg Chest 2 View  Result Date: 03/07/2019 CLINICAL DATA:  Cough, productive with sinus drainage EXAM: CHEST - 2 VIEW COMPARISON:  Radiograph 01/03/2017, CT 12/12/2016 FINDINGS: Posterior wiring extends from a battery pack in left flank superiorly along the posterior left chest wall above  the level of imaging. No consolidation, features of edema, pneumothorax, or effusion. Pulmonary  vascularity is normally distributed. The cardiomediastinal contours are unremarkable. No acute osseous or soft tissue abnormality. Degenerative changes are present in the imaged spine and shoulders. IMPRESSION: No acute cardiopulmonary abnormality. Electronically Signed   By: Lovena Le M.D.   On: 03/07/2019 23:43     Procedures   ____________________________________________   INITIAL IMPRESSION / MDM / ASSESSMENT AND PLAN / ED COURSE  As part of my medical decision making, I reviewed the following data within the electronic MEDICAL RECORD NUMBER   53 year old female presented with above-stated history and physical exam concerning for bronchitis versus pneumonia, rhinosinusitis, COVID-19.  Chest x-ray revealed no evidence of pneumonia or any other gross abnormality per radiologist.  COVID-19 testing performed and pending.  Patient will be prescribed doxycycline and albuterol and Tessalon for home.  I spoke with the patient at length regarding necessity of following up with COVID-19 testing.  ____________________________________________  FINAL CLINICAL IMPRESSION(S) / ED DIAGNOSES  Final diagnoses:  Acute bronchitis, unspecified organism  Acute sinusitis, recurrence not specified, unspecified location     MEDICATIONS GIVEN DURING THIS VISIT:  Medications  doxycycline (VIBRA-TABS) tablet 100 mg (has no administration in time range)  albuterol (VENTOLIN HFA) 108 (90 Base) MCG/ACT inhaler 2 puff (has no administration in time range)  benzonatate (TESSALON) capsule 100 mg (has no administration in time range)     ED Discharge Orders    None      *Please note:  KIMERLY ROWAND was evaluated in Emergency Department on 03/08/2019 for the symptoms described in the history of present illness. She was evaluated in the context of the global COVID-19 pandemic, which necessitated consideration that the patient might be at risk for infection with the SARS-CoV-2 virus that causes COVID-19.  Institutional protocols and algorithms that pertain to the evaluation of patients at risk for COVID-19 are in a state of rapid change based on information released by regulatory bodies including the CDC and federal and state organizations. These policies and algorithms were followed during the patient's care in the ED.  Some ED evaluations and interventions may be delayed as a result of limited staffing during the pandemic.*  Note:  This document was prepared using Dragon voice recognition software and may include unintentional dictation errors.   Gregor Hams, MD 03/08/19 0200

## 2019-03-14 ENCOUNTER — Inpatient Hospital Stay: Payer: Medicare Other | Attending: Hematology and Oncology

## 2019-04-04 ENCOUNTER — Other Ambulatory Visit: Payer: Self-pay | Admitting: Psychiatry

## 2019-04-12 ENCOUNTER — Ambulatory Visit (INDEPENDENT_AMBULATORY_CARE_PROVIDER_SITE_OTHER): Payer: Medicare Other | Admitting: Psychiatry

## 2019-04-12 ENCOUNTER — Encounter: Payer: Self-pay | Admitting: Psychiatry

## 2019-04-12 ENCOUNTER — Other Ambulatory Visit: Payer: Self-pay

## 2019-04-12 DIAGNOSIS — F5105 Insomnia due to other mental disorder: Secondary | ICD-10-CM

## 2019-04-12 DIAGNOSIS — F411 Generalized anxiety disorder: Secondary | ICD-10-CM | POA: Diagnosis not present

## 2019-04-12 DIAGNOSIS — F4310 Post-traumatic stress disorder, unspecified: Secondary | ICD-10-CM

## 2019-04-12 DIAGNOSIS — F331 Major depressive disorder, recurrent, moderate: Secondary | ICD-10-CM

## 2019-04-12 MED ORDER — LAMOTRIGINE 100 MG PO TABS: 100.0000 mg | ORAL_TABLET | Freq: Every day | ORAL | 0 refills | Status: DC

## 2019-04-12 MED ORDER — ARIPIPRAZOLE 5 MG PO TABS: 5.0000 mg | ORAL_TABLET | Freq: Every day | ORAL | 0 refills | Status: DC

## 2019-04-12 MED ORDER — TRAZODONE HCL 150 MG PO TABS: 150.0000 mg | ORAL_TABLET | Freq: Every day | ORAL | 1 refills | Status: DC

## 2019-04-12 MED ORDER — LAMOTRIGINE 25 MG PO TABS: ORAL_TABLET | ORAL | 0 refills | Status: DC

## 2019-04-12 MED ORDER — DULOXETINE HCL 60 MG PO CPEP: 60.0000 mg | ORAL_CAPSULE | Freq: Two times a day (BID) | ORAL | 0 refills | Status: DC

## 2019-04-12 NOTE — Progress Notes (Signed)
Virtual Visit via Telephone Note  I connected with Christine Mcneil on 04/12/19 at  2:30 PM EST by telephone and verified that I am speaking with the correct person using two identifiers.   I discussed the limitations, risks, security and privacy concerns of performing an evaluation and management service by telephone and the availability of in person appointments. I also discussed with the patient that there may be a patient responsible charge related to this service. The patient expressed understanding and agreed to proceed.      I discussed the assessment and treatment plan with the patient. The patient was provided an opportunity to ask questions and all were answered. The patient agreed with the plan and demonstrated an understanding of the instructions.   The patient was advised to call back or seek an in-person evaluation if the symptoms worsen or if the condition fails to improve as anticipated.   [facility] MD OP Progress Note  04/12/2019 3:16 PM Christine Mcneil  MRN:  [id number]  Chief Complaint:  Chief Complaint    Follow-up     HPI: Christine Mcneil is a 53 year old Caucasian female, divorced, lives in [place], on [place], has a history of MDD, PTSD, insomnia, OSA-resolved, history of CVA, history of prothrombin gene mutation, chronic pain, migraine headaches, iron deficiency, was evaluated by phone today.  Patient preferred to do a phone call.  Patient reports she currently struggles with anxiety as well as racing thoughts.  She worries a lot about her situational stressors.  She reports she currently worries about the pandemic.  She also worries about her son who struggles with autism and intellectual disability.  She reports she has been having trouble sleeping.  She goes to bed around 9 PM.  She however wakes up several times at night.  She is able to fall back asleep within 15 minutes or so.  Patient reports her depressive symptoms as making progress.  The Abilify does help.  Patient denies any  suicidality, homicidality or perceptual disturbances.  Patient has not been compliant with psychotherapy sessions.  Encouraged to restart psychotherapy sessions.  Denies any other concerns today. Visit Diagnosis:    ICD-10-CM   1. MDD (major depressive disorder), recurrent episode, moderate (HCC)  F33.1 DULoxetine (CYMBALTA) 60 MG capsule    lamoTRIgine (LAMICTAL) 100 MG tablet    lamoTRIgine (LAMICTAL) 25 MG tablet    ARIPiprazole (ABILIFY) 5 MG tablet  2. PTSD (post-traumatic stress disorder)  F43.10 DULoxetine (CYMBALTA) 60 MG capsule    lamoTRIgine (LAMICTAL) 25 MG tablet    ARIPiprazole (ABILIFY) 5 MG tablet  3. GAD (generalized anxiety disorder)  F41.1 DULoxetine (CYMBALTA) 60 MG capsule  4. Insomnia due to mental disorder  F51.05 traZODone (DESYREL) 150 MG tablet    Past Psychiatric History: Reviewed past psychiatric history from my progress note on 08/16/2017.  Past trials of Effexor, Klonopin.  Past Medical History:  Past Medical History:  Diagnosis Date  . Abdominal pain   . Anxiety and depression   . Atypical facial pain   . Bilateral occipital neuralgia   . Cervical spondylosis without myelopathy   . Chronic daily headache   . Chronic pain   . Chronic prostatitis   . Chronic thoracic back pain   . Depression   . Diabetes mellitus without complication ([place])   . Diabetes mellitus, type II ([place])   . Dysphagia   . Dysrhythmia   . Fibromyalgia   . Hyperlipidemia   . Hypertension   . Insomnia   .  Iron deficiency anemia   . Left hip pain   . Low back pain   . Migraines   . Numbness   . Optic neuropathy   . OSA (obstructive sleep apnea)   . Polyarthralgia   . Primary osteoarthritis of both hips   . Prothrombin gene mutation ([place])   . Pseudotumor cerebri   . Rectal bleeding   . Right shoulder pain   . Rotator cuff syndrome   . Stroke ([place])   . Thyroid disease   . TIA (transient ischemic attack) 05/27/2017    Past Surgical History:  Procedure Laterality  Date  . ABDOMINAL HYSTERECTOMY     total  . CHOLECYSTECTOMY    . COLONOSCOPY WITH PROPOFOL N/A 03/21/2018   Procedure: COLONOSCOPY WITH PROPOFOL;  Surgeon: Lucilla Lame, MD;  Location: [facility] ENDOSCOPY;  Service: Endoscopy;  Laterality: N/A;  . ESOPHAGOGASTRODUODENOSCOPY (EGD) WITH PROPOFOL N/A 03/21/2018   Procedure: ESOPHAGOGASTRODUODENOSCOPY (EGD) WITH PROPOFOL;  Surgeon: Lucilla Lame, MD;  Location: [facility] ENDOSCOPY;  Service: Endoscopy;  Laterality: N/A;  . JOINT REPLACEMENT    . KNEE SURGERY Left   . ROTATOR CUFF REPAIR Right   . spinal neurostimulator      Family Psychiatric History: Reviewed family psychiatric history from my progress note on 08/16/2017.  Family History:  Family History  Problem Relation Age of Onset  . Diabetes Mother   . Hyperlipidemia Mother   . Hypertension Mother   . COPD Mother   . CVA Mother   . Anemia Mother   . Diabetes Father   . Hyperlipidemia Father   . Hypertension Father   . Thyroid disease Father   . Alcohol abuse Father   . Peripheral vascular disease Sister   . Hypertension Sister   . Anxiety disorder Son   . Depression Son   . Bipolar disorder Son   . Seizures Son     Social History: Reviewed social history from my progress note on 08/16/2017. Social History   Socioeconomic History  . Marital status: Divorced    Spouse name: Not on file  . Number of children: 1  . Years of education: Not on file  . Highest education level: High school graduate  Occupational History    Comment: disablitiy  Social Needs  . Financial resource strain: Not hard at all  . Food insecurity    Worry: Never true    Inability: Never true  . Transportation needs    Medical: No    Non-medical: No  Tobacco Use  . Smoking status: Never Smoker  . Smokeless tobacco: Never Used  Substance and Sexual Activity  . Alcohol use: No  . Drug use: No  . Sexual activity: Not Currently  Lifestyle  . Physical activity    Days per week: 0 days    Minutes per  session: 0 min  . Stress: Very much  Relationships  . Social Herbalist on phone: Once a week    Gets together: Never    Attends religious service: More than 4 times per year    Active member of club or organization: No    Attends meetings of clubs or organizations: Never    Relationship status: Divorced  Other Topics Concern  . Not on file  Social History Narrative  . Not on file    Allergies:  Allergies  Allergen Reactions  . Amoxicillin Itching  . Amoxapine Itching  . Dapagliflozin     Other reaction(s): Other (See Comments) Thrush & vaginal itching  .  Keflex [Cephalexin] Itching    Metabolic Disorder Labs: Lab Results  Component Value Date   HGBA1C 9.9 (H) 06/21/2017   MPG 237.43 06/21/2017   Lab Results  Component Value Date   PROLACTIN 5.5 09/06/2017   Lab Results  Component Value Date   CHOL 277 (H) 06/21/2017   TRIG 196 (H) 06/21/2017   HDL 64 06/21/2017   CHOLHDL 4.3 06/21/2017   VLDL 39 06/21/2017   LDLCALC 174 (H) 06/21/2017   Lab Results  Component Value Date   TSH 2.890 09/06/2017   TSH 3.176 01/04/2017    Therapeutic Level Labs: No results found for: LITHIUM No results found for: VALPROATE No components found for:  CBMZ  Current Medications: Current Outpatient Medications  Medication Sig Dispense Refill  . guaiFENesin-codeine 100-10 MG/5ML syrup Take by mouth.    Marland Kitchen ACCU-CHEK AVIVA PLUS test strip     . ACCU-CHEK SOFTCLIX LANCETS lancets     . acetaZOLAMIDE (DIAMOX) 125 MG tablet     . AIMOVIG 70 MG/ML SOAJ Inject 70 mg into the skin every 28 (twenty-eight) days.    Marland Kitchen albuterol (VENTOLIN HFA) 108 (90 Base) MCG/ACT inhaler Inhale into the lungs.    Marland Kitchen albuterol (VENTOLIN HFA) 108 (90 Base) MCG/ACT inhaler Inhale 2 puffs into the lungs every 6 (six) hours as needed for wheezing or shortness of breath. 1 g 0  . ARIPiprazole (ABILIFY) 5 MG tablet Take 1 tablet (5 mg total) by mouth daily. 90 tablet 0  .  aspirin-acetaminophen-caffeine (EXCEDRIN MIGRAINE) 250-250-65 MG tablet Take 2 tablets by mouth every 8 (eight) hours as needed for headache or migraine.     Marland Kitchen atorvastatin (LIPITOR) 80 MG tablet Take 80 mg by mouth daily at 6 PM.    . azithromycin (ZITHROMAX) 250 MG tablet     . benzonatate (TESSALON PERLES) 100 MG capsule Take 1 capsule (100 mg total) by mouth 3 (three) times daily as needed for cough. 30 capsule 0  . Blood Glucose Monitoring Suppl (ACCU-CHEK AVIVA PLUS) w/Device KIT     . Carboxymethylcellulose Sod PF (REFRESH PLUS) 0.5 % SOLN Apply to eye.    . Cholecalciferol (VITAMIN D3) 1000 units CAPS Take 1 capsule by mouth daily.    . clindamycin (CLEOCIN) 300 MG capsule     . clonazePAM (KLONOPIN) 0.5 MG tablet May use up to once daily as needed for facial pain    . clotrimazole (LOTRIMIN) 1 % cream     . cyclobenzaprine (FLEXERIL) 5 MG tablet     . dabigatran (PRADAXA) 150 MG CAPS capsule Take 150 mg by mouth 2 (two) times daily.    . diclofenac (FLECTOR) 1.3 % PTCH     . diltiazem (CARDIZEM CD) 240 MG 24 hr capsule Take by mouth. Take 1 capsule by mouth once daily    . DULoxetine (CYMBALTA) 60 MG capsule Take 1 capsule (60 mg total) by mouth 2 (two) times daily. 180 capsule 0  . esomeprazole (NEXIUM) 40 MG capsule Take 40 mg by mouth daily.    . famotidine (PEPCID) 40 MG tablet     . ferrous sulfate 325 (65 FE) MG tablet Take by mouth. Take 325 mg daily with breakfast    . fluticasone (FLONASE) 50 MCG/ACT nasal spray     . glipiZIDE (GLUCOTROL) 5 MG tablet Take 1 tablet (5 mg total) by mouth 2 (two) times daily before a meal. 60 tablet 0  . HUMALOG KWIKPEN 100 UNIT/ML KwikPen     . hydrOXYzine (VISTARIL)  25 MG capsule TAKE (1) CAPSULE BY MOUTH TWICE DAILY ASNEEDED FOR SEVERA ANXIETY ATTACKS 180 capsule 0  . ketoconazole (NIZORAL) 2 % cream 1 application.    Marland Kitchen lamoTRIgine (LAMICTAL) 100 MG tablet Take 1 tablet (100 mg total) by mouth daily. To be combined with 25 mg 90 tablet 0  .  lamoTRIgine (LAMICTAL) 25 MG tablet TAKE ONE TABLET BY MOUTH DAILY. TO BE COMBINED WITH 100 MG. 90 tablet 0  . levothyroxine (SYNTHROID, LEVOTHROID) 100 MCG tablet Take 100 mcg by mouth daily.    Marland Kitchen loratadine (CLARITIN) 10 MG tablet Take 10 mg by mouth daily as needed for allergies.    . Magnesium 250 MG TABS Take by mouth.    Marland Kitchen MAGNESIUM PO Take 500 mg by mouth 2 (two) times daily.    . meloxicam (MOBIC) 7.5 MG tablet Take by mouth.    . metFORMIN (GLUCOPHAGE-XR) 500 MG 24 hr tablet Take 500 mg by mouth 2 (two) times daily.     . metoCLOPramide (REGLAN) 10 MG tablet Take 1 tablet (10 mg total) by mouth every 8 (eight) hours as needed for up to 3 days for nausea. 20 tablet 0  . metoprolol succinate (TOPROL-XL) 25 MG 24 hr tablet Take by mouth.    . naloxone (NARCAN) nasal spray 4 mg/0.1 mL 1 spray into nose for opioid overdose. If needed, may repeat spray in 2-3 min    . NUCYNTA ER 100 MG 12 hr tablet Take 1 tablet by mouth 2 (two) times daily.    . ondansetron (ZOFRAN) 4 MG tablet TAKE (1) TABLET BY MOUTH EVERY 8 HOURS AS NEEDED FOR NAUSEA AND VOMITING    . ondansetron (ZOFRAN-ODT) 4 MG disintegrating tablet Take 4 mg by mouth every 8 (eight) hours as needed for nausea or vomiting.    . pioglitazone (ACTOS) 15 MG tablet Take by mouth. Take 1 tablet by mouth once daily    . propranolol (INDERAL) 20 MG tablet     . quinapril (ACCUPRIL) 10 MG tablet Take 10 mg by mouth daily.    . ranitidine (ZANTAC) 150 MG tablet Take 150 mg by mouth 2 (two) times daily.    . Rimegepant Sulfate (NURTEC) 75 MG TBDP Take by mouth.    . rizatriptan (MAXALT) 10 MG tablet Take 1 tab at migraine onset. May repeat in 2 hours prn.  Limit to 2 in 24 hours and to 2 days weekly.    . sitaGLIPtin (JANUVIA) 100 MG tablet Take 100 mg by mouth daily.    . traZODone (DESYREL) 150 MG tablet Take 1 tablet (150 mg total) by mouth at bedtime. 90 tablet 1  . TRESIBA FLEXTOUCH 200 UNIT/ML SOPN      No current facility-administered  medications for this visit.      Musculoskeletal: Strength & Muscle Tone: UTA Gait & Station: Reports as WNL Patient leans: N/A  Psychiatric Specialty Exam: Review of Systems  Psychiatric/Behavioral: The patient is nervous/anxious and has insomnia.   All other systems reviewed and are negative.   There were no vitals taken for this visit.There is no height or weight on file to calculate BMI.  General Appearance: UTA  Eye Contact:  UTA  Speech:  Clear and Coherent  Volume:  Normal  Mood:  Anxious  Affect:  UTA  Thought Process:  Goal Directed and Descriptions of Associations: Intact  Orientation:  Full (Time, Place, and Person)  Thought Content: Logical   Suicidal Thoughts:  No  Homicidal Thoughts:  No  Memory:  Immediate;   Fair Recent;   Fair Remote;   Fair  Judgement:  Fair  Insight:  Fair  Psychomotor Activity:  UTA  Concentration:  Concentration: Fair and Attention Span: Fair  Recall:  [employer] of Knowledge: Fair  Language: Fair  Akathisia:  No  Handed:  Right  AIMS (if indicated): Denies tremors, rigidity  Assets:  Communication Skills Desire for Improvement Housing Social Support  ADL's:  Intact  Cognition: WNL  Sleep:  Poor   Screenings:   Assessment and Plan: Christine Mcneil is a 53 year old Caucasian female who has a history of MDD, PTSD, chronic pain, migraine headaches, history of CVA, iron deficiency, prothrombin gene mutation was evaluated by telemedicine today.  Patient continues to struggle with anxiety as well as sleep problems.  Patient does have psychosocial stressors of the current pandemic as well as her son's health issues.  Patient will benefit from medication readjustment as well as psychotherapy sessions.  Plan as noted below.  Plan MDD-unstable Increase Cymbalta to 60 mg p.o. twice daily Lamotrigine 125 mg p.o. daily Abilify 5 mg p.o. daily Discontinue Wellbutrin for lack of benefit  GAD-unstable Cymbalta increased to 60 mg p.o. twice  daily Continue hydroxyzine 25 mg p.o. twice daily as needed for severe anxiety symptoms Patient encouraged to restart psychotherapy sessions.  PTSD-stable Continue CBT  Insomnia-unstable Discussed sleep hygiene techniques Trazodone 150 mg p.o. nightly Advised patient to start taking hydroxyzine 25 mg as needed.  Follow-up in clinic in 4 weeks or sooner if needed.  December 22 at 1 PM  I have spent atleast 15 minutes non face to face with patient today. More than 50 % of the time was spent for psychoeducation and supportive psychotherapy and care coordination. This note was generated in part or whole with voice recognition software. Voice recognition is usually quite accurate but there are transcription errors that can and very often do occur. I apologize for any typographical errors that were not detected and corrected.       Ursula Alert, MD 04/12/2019, 3:16 PM

## 2019-05-09 ENCOUNTER — Other Ambulatory Visit: Payer: Self-pay | Admitting: Psychiatry

## 2019-05-15 ENCOUNTER — Ambulatory Visit (INDEPENDENT_AMBULATORY_CARE_PROVIDER_SITE_OTHER): Payer: Medicare Other | Admitting: Psychiatry

## 2019-05-15 ENCOUNTER — Encounter: Payer: Self-pay | Admitting: Psychiatry

## 2019-05-15 ENCOUNTER — Other Ambulatory Visit: Payer: Self-pay

## 2019-05-15 DIAGNOSIS — F411 Generalized anxiety disorder: Secondary | ICD-10-CM | POA: Diagnosis not present

## 2019-05-15 DIAGNOSIS — F5105 Insomnia due to other mental disorder: Secondary | ICD-10-CM | POA: Diagnosis not present

## 2019-05-15 DIAGNOSIS — F4310 Post-traumatic stress disorder, unspecified: Secondary | ICD-10-CM

## 2019-05-15 DIAGNOSIS — F331 Major depressive disorder, recurrent, moderate: Secondary | ICD-10-CM

## 2019-05-15 MED ORDER — MIRTAZAPINE 15 MG PO TABS: 7.5000 mg | ORAL_TABLET | Freq: Every day | ORAL | 2 refills | Status: DC

## 2019-05-15 MED ORDER — DULOXETINE HCL 60 MG PO CPEP: 60.0000 mg | ORAL_CAPSULE | Freq: Every day | ORAL | 1 refills | Status: DC

## 2019-05-15 NOTE — Progress Notes (Signed)
Virtual Visit via Telephone Note  I connected with Christine Mcneil on 05/15/19 at  1:00 PM EST by telephone and verified that I am speaking with the correct person using two identifiers.   I discussed the limitations, risks, security and privacy concerns of performing an evaluation and management service by telephone and the availability of in person appointments. I also discussed with the patient that there may be a patient responsible charge related to this service. The patient expressed understanding and agreed to proceed.      I discussed the assessment and treatment plan with the patient. The patient was provided an opportunity to ask questions and all were answered. The patient agreed with the plan and demonstrated an understanding of the instructions.   The patient was advised to call back or seek an in-person evaluation if the symptoms worsen or if the condition fails to improve as anticipated.   [facility] MD OP Progress Note  05/15/2019 2:29 PM Christine Mcneil  MRN:  [id number]  Chief Complaint:  Chief Complaint    Follow-up     HPI: Christine Mcneil is a 53 year old Caucasian female, divorced, lives in [place] , on [place], has a history of MDD, PTSD, insomnia, OSA-resolved, history of CVA, history of prothrombin gene mutation, chronic pain, migraine headaches, iron deficiency was evaluated by phone today.  Patient preferred to do a phone call.  Patient today reports she is currently struggling with anxiety as well as depressive symptoms.  She reports it started after she was taken off of the Klonopin.  She reports her pain provider took her off of the [place].  Ever since then she has been struggling with mood symptoms as well as sleep problems.  She reports the trazodone as not helpful for her sleep anymore.  She also reports she has been taking only Cymbalta 60 mg daily and is not using twice a day since she reports the pharmacy gave her only 1 tablet daily.  Patient denies any suicidality,  homicidality or perceptual disturbances.  Patient reports she has been noncompliant with her therapy sessions as well.   Visit Diagnosis:    ICD-10-CM   1. MDD (major depressive disorder), recurrent episode, moderate (HCC)  F33.1 DULoxetine (CYMBALTA) 60 MG capsule    mirtazapine (REMERON) 15 MG tablet  2. PTSD (post-traumatic stress disorder)  F43.10 DULoxetine (CYMBALTA) 60 MG capsule    mirtazapine (REMERON) 15 MG tablet  3. GAD (generalized anxiety disorder)  F41.1 DULoxetine (CYMBALTA) 60 MG capsule    mirtazapine (REMERON) 15 MG tablet  4. Insomnia due to mental disorder  F51.05     Past Psychiatric History: I have reviewed past psychiatric history from my progress note on 08/16/2017.  Past trials of Effexor, Klonopin.  Past Medical History:  Past Medical History:  Diagnosis Date  . Abdominal pain   . Anxiety and depression   . Atypical facial pain   . Bilateral occipital neuralgia   . Cervical spondylosis without myelopathy   . Chronic daily headache   . Chronic pain   . Chronic prostatitis   . Chronic thoracic back pain   . Depression   . Diabetes mellitus without complication ([place])   . Diabetes mellitus, type II ([place])   . Dysphagia   . Dysrhythmia   . Fibromyalgia   . Hyperlipidemia   . Hypertension   . Insomnia   . Iron deficiency anemia   . Left hip pain   . Low back pain   . Migraines   .  Numbness   . Optic neuropathy   . OSA (obstructive sleep apnea)   . Polyarthralgia   . Primary osteoarthritis of both hips   . Prothrombin gene mutation ([place])   . Pseudotumor cerebri   . Rectal bleeding   . Right shoulder pain   . Rotator cuff syndrome   . Stroke ([place])   . Thyroid disease   . TIA (transient ischemic attack) 05/27/2017    Past Surgical History:  Procedure Laterality Date  . ABDOMINAL HYSTERECTOMY     total  . CHOLECYSTECTOMY    . COLONOSCOPY WITH PROPOFOL N/A 03/21/2018   Procedure: COLONOSCOPY WITH PROPOFOL;  Surgeon: Lucilla Lame, MD;   Location: [facility] ENDOSCOPY;  Service: Endoscopy;  Laterality: N/A;  . ESOPHAGOGASTRODUODENOSCOPY (EGD) WITH PROPOFOL N/A 03/21/2018   Procedure: ESOPHAGOGASTRODUODENOSCOPY (EGD) WITH PROPOFOL;  Surgeon: Lucilla Lame, MD;  Location: [facility] ENDOSCOPY;  Service: Endoscopy;  Laterality: N/A;  . JOINT REPLACEMENT    . KNEE SURGERY Left   . ROTATOR CUFF REPAIR Right   . spinal neurostimulator      Family Psychiatric History: I have reviewed family psychiatric history from my progress note on 08/16/2017.  Family History:  Family History  Problem Relation Age of Onset  . Diabetes Mother   . Hyperlipidemia Mother   . Hypertension Mother   . COPD Mother   . CVA Mother   . Anemia Mother   . Diabetes Father   . Hyperlipidemia Father   . Hypertension Father   . Thyroid disease Father   . Alcohol abuse Father   . Peripheral vascular disease Sister   . Hypertension Sister   . Anxiety disorder Son   . Depression Son   . Bipolar disorder Son   . Seizures Son     Social History: Reviewed social history from my progress note on 08/16/2017. Social History   Socioeconomic History  . Marital status: Divorced    Spouse name: Not on file  . Number of children: 1  . Years of education: Not on file  . Highest education level: High school graduate  Occupational History    Comment: disablitiy  Tobacco Use  . Smoking status: Never Smoker  . Smokeless tobacco: Never Used  Substance and Sexual Activity  . Alcohol use: No  . Drug use: No  . Sexual activity: Not Currently  Other Topics Concern  . Not on file  Social History Narrative  . Not on file   Social Determinants of Health   Financial Resource Strain:   . Difficulty of Paying Living Expenses: Not on file  Food Insecurity:   . Worried About Charity fundraiser in the Last Year: Not on file  . Ran Out of Food in the Last Year: Not on file  Transportation Needs:   . Lack of Transportation (Medical): Not on file  . Lack of Transportation  (Non-Medical): Not on file  Physical Activity:   . Days of Exercise per Week: Not on file  . Minutes of Exercise per Session: Not on file  Stress:   . Feeling of Stress : Not on file  Social Connections:   . Frequency of Communication with Friends and Family: Not on file  . Frequency of Social Gatherings with Friends and Family: Not on file  . Attends Religious Services: Not on file  . Active Member of Clubs or Organizations: Not on file  . Attends Archivist Meetings: Not on file  . Marital Status: Not on file    Allergies:  Allergies  Allergen Reactions  . Amoxicillin Itching  . Amoxapine Itching  . Dapagliflozin     Other reaction(s): Other (See Comments) Thrush & vaginal itching  . Keflex [Cephalexin] Itching    Metabolic Disorder Labs: Lab Results  Component Value Date   HGBA1C 9.9 (H) 06/21/2017   MPG 237.43 06/21/2017   Lab Results  Component Value Date   PROLACTIN 5.5 09/06/2017   Lab Results  Component Value Date   CHOL 277 (H) 06/21/2017   TRIG 196 (H) 06/21/2017   HDL 64 06/21/2017   CHOLHDL 4.3 06/21/2017   VLDL 39 06/21/2017   LDLCALC 174 (H) 06/21/2017   Lab Results  Component Value Date   TSH 2.890 09/06/2017   TSH 3.176 01/04/2017    Therapeutic Level Labs: No results found for: LITHIUM No results found for: VALPROATE No components found for:  CBMZ  Current Medications: Current Outpatient Medications  Medication Sig Dispense Refill  . ACCU-CHEK AVIVA PLUS test strip     . ACCU-CHEK SOFTCLIX LANCETS lancets     . acetaZOLAMIDE (DIAMOX) 125 MG tablet     . AIMOVIG 70 MG/ML SOAJ Inject 70 mg into the skin every 28 (twenty-eight) days.    Marland Kitchen albuterol (VENTOLIN HFA) 108 (90 Base) MCG/ACT inhaler Inhale into the lungs.    Marland Kitchen albuterol (VENTOLIN HFA) 108 (90 Base) MCG/ACT inhaler Inhale 2 puffs into the lungs every 6 (six) hours as needed for wheezing or shortness of breath. 1 g 0  . ARIPiprazole (ABILIFY) 5 MG tablet Take 1 tablet (5  mg total) by mouth daily. 90 tablet 0  . aspirin-acetaminophen-caffeine (EXCEDRIN MIGRAINE) 250-250-65 MG tablet Take 2 tablets by mouth every 8 (eight) hours as needed for headache or migraine.     Marland Kitchen atorvastatin (LIPITOR) 80 MG tablet Take 80 mg by mouth daily at 6 PM.    . baclofen (LIORESAL) 10 MG tablet TAKE 1 TABLET BY MOUTH ONCE DAILY AS NEEDED FOR UP TO 30 DAYS. MAY TAKE 2 TABLETS IF NEEDED    . Blood Glucose Monitoring Suppl (ACCU-CHEK AVIVA PLUS) w/Device KIT     . Carboxymethylcellulose Sod PF (REFRESH PLUS) 0.5 % SOLN Apply to eye.    . Cholecalciferol (VITAMIN D3) 1000 units CAPS Take 1 capsule by mouth daily.    . clindamycin (CLEOCIN) 300 MG capsule     . clotrimazole (LOTRIMIN) 1 % cream     . cyclobenzaprine (FLEXERIL) 5 MG tablet     . diltiazem (TIAZAC) 240 MG 24 hr capsule TAKE ONE (1) CAPSULE EACH DAY.    Marland Kitchen esomeprazole (NEXIUM) 40 MG capsule Take 40 mg by mouth daily.    . famotidine (PEPCID) 40 MG tablet     . ferrous sulfate 325 (65 FE) MG tablet Take by mouth. Take 325 mg daily with breakfast    . fluticasone (FLONASE) 50 MCG/ACT nasal spray     . glipiZIDE (GLUCOTROL) 5 MG tablet Take 1 tablet (5 mg total) by mouth 2 (two) times daily before a meal. 60 tablet 0  . HUMALOG KWIKPEN 100 UNIT/ML KwikPen     . hydrOXYzine (VISTARIL) 25 MG capsule TAKE (1) CAPSULE BY MOUTH TWICE DAILY ASNEEDED FOR SEVERA ANXIETY ATTACKS 180 capsule 0  . ketoconazole (NIZORAL) 2 % cream 1 application.    Marland Kitchen lamoTRIgine (LAMICTAL) 150 MG tablet Take by mouth.    . lamoTRIgine (LAMICTAL) 25 MG tablet TAKE ONE TABLET BY MOUTH DAILY. TO BE COMBINED WITH 100 MG. 90 tablet 0  .  levothyroxine (SYNTHROID, LEVOTHROID) 100 MCG tablet Take 100 mcg by mouth daily.    Marland Kitchen loratadine (CLARITIN) 10 MG tablet Take 10 mg by mouth daily as needed for allergies.    . Magnesium 250 MG TABS Take by mouth.    Marland Kitchen MAGNESIUM PO Take 500 mg by mouth 2 (two) times daily.    . meloxicam (MOBIC) 7.5 MG tablet Take by  mouth.    . metFORMIN (GLUCOPHAGE-XR) 500 MG 24 hr tablet Take 500 mg by mouth 2 (two) times daily.     . metoprolol succinate (TOPROL-XL) 25 MG 24 hr tablet Take by mouth.    . naloxone (NARCAN) nasal spray 4 mg/0.1 mL 1 spray into nose for opioid overdose. If needed, may repeat spray in 2-3 min    . NUCYNTA ER 100 MG 12 hr tablet Take 1 tablet by mouth 2 (two) times daily.    . ondansetron (ZOFRAN) 4 MG tablet TAKE (1) TABLET BY MOUTH EVERY 8 HOURS AS NEEDED FOR NAUSEA AND VOMITING    . ondansetron (ZOFRAN-ODT) 4 MG disintegrating tablet Take 4 mg by mouth every 8 (eight) hours as needed for nausea or vomiting.    . propranolol (INDERAL) 20 MG tablet     . quinapril (ACCUPRIL) 10 MG tablet Take 10 mg by mouth daily.    . Rimegepant Sulfate (NURTEC) 75 MG TBDP Take by mouth.    . rizatriptan (MAXALT) 10 MG tablet Take 1 tab at migraine onset. May repeat in 2 hours prn.  Limit to 2 in 24 hours and to 2 days weekly.    . TRESIBA FLEXTOUCH 200 UNIT/ML SOPN     . azithromycin (ZITHROMAX) 250 MG tablet     . benzonatate (TESSALON PERLES) 100 MG capsule Take 1 capsule (100 mg total) by mouth 3 (three) times daily as needed for cough. (Patient not taking: Reported on 05/15/2019) 30 capsule 0  . dabigatran (PRADAXA) 150 MG CAPS capsule Take 150 mg by mouth 2 (two) times daily.    . diclofenac (FLECTOR) 1.3 % PTCH     . diltiazem (CARDIZEM CD) 240 MG 24 hr capsule Take by mouth. Take 1 capsule by mouth once daily    . DULoxetine (CYMBALTA) 60 MG capsule Take 1 capsule (60 mg total) by mouth daily. 90 capsule 1  . guaiFENesin-codeine 100-10 MG/5ML syrup Take by mouth.    . lamoTRIgine (LAMICTAL) 100 MG tablet Take 1 tablet (100 mg total) by mouth daily. To be combined with 25 mg (Patient not taking: Reported on 05/15/2019) 90 tablet 0  . mirtazapine (REMERON) 15 MG tablet Take 0.5 tablets (7.5 mg total) by mouth at bedtime. 15 tablet 2  . pioglitazone (ACTOS) 15 MG tablet Take by mouth. Take 1 tablet by  mouth once daily    . ranitidine (ZANTAC) 150 MG tablet Take 150 mg by mouth 2 (two) times daily.    . sitaGLIPtin (JANUVIA) 100 MG tablet Take 100 mg by mouth daily.     No current facility-administered medications for this visit.     Musculoskeletal: Strength & Muscle Tone: UTA Gait & Station: Reports as WNL Patient leans: N/A  Psychiatric Specialty Exam: Review of Systems  Psychiatric/Behavioral: Positive for dysphoric mood and sleep disturbance. The patient is nervous/anxious.   All other systems reviewed and are negative.   There were no vitals taken for this visit.There is no height or weight on file to calculate BMI.  General Appearance: UTA  Eye Contact:  UTA  Speech:  Clear and Coherent  Volume:  Normal  Mood:  Anxious and Depressed  Affect:  Congruent  Thought Process:  Goal Directed and Descriptions of Associations: Intact  Orientation:  Full (Time, Place, and Person)  Thought Content: Logical   Suicidal Thoughts:  No  Homicidal Thoughts:  No  Memory:  Immediate;   Fair Recent;   Fair Remote;   Fair  Judgement:  Fair  Insight:  Fair  Psychomotor Activity:  Normal  Concentration:  Concentration: Fair and Attention Span: Fair  Recall:  [employer] of Knowledge: Fair  Language: Fair  Akathisia:  No  Handed:  Right  AIMS (if indicated): Denies tremors, rigidity  Assets:  Communication Skills Desire for Improvement Housing Social Support  ADL's:  Intact  Cognition: WNL  Sleep:  Poor   Screenings:   Assessment and Plan: Christine Mcneil is a 53 year old Caucasian female, has a history of MDD, PTSD, chronic pain, migraine headaches, history of CVA, iron deficiency, prothrombin gene mutation was evaluated by telemedicine today.  Patient is currently struggling with sleep problems as well as depression and anxiety ever since being taken off of Klonopin.  Patient will benefit from medication readjustment and psychotherapy sessions.  Plan as noted  below.  Plan MDD-unstable Patient reports she continues to take Cymbalta 60 mg p.o. daily and did not increase the dosage as discussed last visit.  She reports her pharmacy gave her only 1 tablet daily.  Discussed with patient that her dosage was increased last visit and to call the clinic next time if she has concerns about dosing . Continue lamotrigine 125 mg p.o. daily Abilify 5 mg p.o. daily Add mirtazapine 7.5 mg p.o. nightly.   GAD-unstable Cymbalta 60 mg p.o. daily Add mirtazapine 7.5 mg p.o. nightly Restart psychotherapy session-patient encouraged to schedule appointment with therapist.  Writer will send message to our referral coordinator Mr. Gates Rigg.  PTSD-stable Continue CBT  Insomnia-unstable Discontinue trazodone. Start mirtazapine 7.5 mg p.o. nightly   Follow-up in clinic in 3 weeks or sooner if needed.  January 20 at 11:20 AM   I have spent atleast 15 minutes non face to face with patient today. More than 50 % of the time was spent for psychoeducation and supportive psychotherapy and care coordination. This note was generated in part or whole with voice recognition software. Voice recognition is usually quite accurate but there are transcription errors that can and very often do occur. I apologize for any typographical errors that were not detected and corrected.      Ursula Alert, MD 05/15/2019, 2:29 PM

## 2019-05-21 ENCOUNTER — Telehealth (HOSPITAL_COMMUNITY): Payer: Self-pay | Admitting: Clinical

## 2019-05-21 ENCOUNTER — Other Ambulatory Visit: Payer: Self-pay

## 2019-05-21 ENCOUNTER — Ambulatory Visit (HOSPITAL_COMMUNITY): Payer: Medicare Other | Admitting: Clinical

## 2019-05-21 NOTE — Telephone Encounter (Signed)
The OPT therapist attempted the text to session for Doxy Me. x2 at 10:00AM and 10:10AM. The patient did not respond and missed the scheduled session.

## 2019-06-06 DIAGNOSIS — I208 Other forms of angina pectoris: Secondary | ICD-10-CM | POA: Insufficient documentation

## 2019-06-11 ENCOUNTER — Other Ambulatory Visit: Payer: Self-pay | Admitting: Psychiatry

## 2019-06-11 DIAGNOSIS — F331 Major depressive disorder, recurrent, moderate: Secondary | ICD-10-CM

## 2019-06-12 ENCOUNTER — Inpatient Hospital Stay: Payer: Medicare Other

## 2019-06-12 ENCOUNTER — Other Ambulatory Visit: Payer: Self-pay

## 2019-06-12 ENCOUNTER — Inpatient Hospital Stay: Payer: Medicare Other | Attending: Hematology and Oncology

## 2019-06-12 DIAGNOSIS — D509 Iron deficiency anemia, unspecified: Secondary | ICD-10-CM | POA: Insufficient documentation

## 2019-06-12 DIAGNOSIS — D508 Other iron deficiency anemias: Secondary | ICD-10-CM

## 2019-06-12 LAB — IRON AND TIBC
Iron: 62 ug/dL (ref 28–170)
Saturation Ratios: 13 % (ref 10.4–31.8)
TIBC: 484 ug/dL — ABNORMAL HIGH (ref 250–450)
UIBC: 422 ug/dL

## 2019-06-12 LAB — CBC WITH DIFFERENTIAL/PLATELET
Abs Immature Granulocytes: 0.02 10*3/uL (ref 0.00–0.07)
Basophils Absolute: 0 10*3/uL (ref 0.0–0.1)
Basophils Relative: 1 %
Eosinophils Absolute: 0.1 10*3/uL (ref 0.0–0.5)
Eosinophils Relative: 1 %
HCT: 41.9 % (ref 36.0–46.0)
Hemoglobin: 13.7 g/dL (ref 12.0–15.0)
Immature Granulocytes: 0 %
Lymphocytes Relative: 34 %
Lymphs Abs: 2.9 10*3/uL (ref 0.7–4.0)
MCH: 28.6 pg (ref 26.0–34.0)
MCHC: 32.7 g/dL (ref 30.0–36.0)
MCV: 87.5 fL (ref 80.0–100.0)
Monocytes Absolute: 0.6 10*3/uL (ref 0.1–1.0)
Monocytes Relative: 7 %
Neutro Abs: 4.9 10*3/uL (ref 1.7–7.7)
Neutrophils Relative %: 57 %
Platelets: 215 10*3/uL (ref 150–400)
RBC: 4.79 MIL/uL (ref 3.87–5.11)
RDW: 13.2 % (ref 11.5–15.5)
WBC: 8.6 10*3/uL (ref 4.0–10.5)
nRBC: 0 % (ref 0.0–0.2)

## 2019-06-12 LAB — FERRITIN: Ferritin: 20 ng/mL (ref 11–307)

## 2019-06-12 NOTE — Progress Notes (Signed)
Confirmed name and DOB. Denies any concerns.  

## 2019-06-13 ENCOUNTER — Inpatient Hospital Stay: Payer: Medicare Other | Admitting: Hematology and Oncology

## 2019-06-13 ENCOUNTER — Inpatient Hospital Stay: Payer: Medicare Other

## 2019-06-13 ENCOUNTER — Encounter: Payer: Self-pay | Admitting: Psychiatry

## 2019-06-13 ENCOUNTER — Ambulatory Visit (INDEPENDENT_AMBULATORY_CARE_PROVIDER_SITE_OTHER): Payer: Medicare Other | Admitting: Psychiatry

## 2019-06-13 DIAGNOSIS — G44219 Episodic tension-type headache, not intractable: Secondary | ICD-10-CM | POA: Insufficient documentation

## 2019-06-13 DIAGNOSIS — F4310 Post-traumatic stress disorder, unspecified: Secondary | ICD-10-CM

## 2019-06-13 DIAGNOSIS — F331 Major depressive disorder, recurrent, moderate: Secondary | ICD-10-CM

## 2019-06-13 DIAGNOSIS — F5105 Insomnia due to other mental disorder: Secondary | ICD-10-CM

## 2019-06-13 DIAGNOSIS — F411 Generalized anxiety disorder: Secondary | ICD-10-CM | POA: Diagnosis not present

## 2019-06-13 MED ORDER — DULOXETINE HCL 30 MG PO CPEP: 30.0000 mg | ORAL_CAPSULE | Freq: Every day | ORAL | 0 refills | Status: DC

## 2019-06-13 MED ORDER — HYDROXYZINE PAMOATE 25 MG PO CAPS: ORAL_CAPSULE | ORAL | 0 refills | Status: DC

## 2019-06-13 MED ORDER — TRAZODONE HCL 100 MG PO TABS: 150.0000 mg | ORAL_TABLET | Freq: Every evening | ORAL | 0 refills | Status: DC | PRN

## 2019-06-13 MED ORDER — DULOXETINE HCL 60 MG PO CPEP: 60.0000 mg | ORAL_CAPSULE | Freq: Every day | ORAL | 0 refills | Status: DC
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
## 2019-06-16 NOTE — Progress Notes (Signed)
[facility]  [address], [place] [address], [place] [address] Phone: [phone]  Fax: [phone]   Clinic Day:  06/18/2019  Referring physician: Doughton, Latricia Heft, MD  Chief Complaint: Christine Mcneil is a 54 y.o. female with iron deficiency anemia  who is seen for 6 month assessment.   HPI: The patient was last seen in the hematology clinic via telemedicine on 12/13/2018. At that time, she felt "good".  She had some fatigue. Hemoglobin was 14.5 and MCV 89.8.  Ferritin 88. Patient did not received Venofer.   She had a follow up with Mack Guise, PA in neurology on 12/13/2018.  She was no longer having daily headaches since starting SPG blocks. She noted dull aching headaches.  Soft tissue neck CT with contrast on 03/01/2019 revealed no neck mass, acute abnormality, or cause of the patient's symptoms.  She was seen in [facility] ER on 03/08/2019 for acute bronchitis.  CXR showed no acute cardiopulmonary abnormality.She was given albuterol, benzonate, and doxycycline.  Labs on 06/12/2019 revealed hematocrit 41.9, hemoglobin 13.7, MCV 87.5, platelets 215,000, WBC 8,600.  Ferritin was 20 with an iron saturation of 13% and a TIBC of 484.   During the interim, she has felt "alright". She notes feeling fatigued x 1 month. She wants to sleep majority of the day. The patient notes her energy is better after getting Venofer. She denies any B symptoms. She denies ice pica. She has restless legs. She has shortness of breath on exertion.   She notes her fibromyalgia flares up are "bad". She remains on Pradaxa; she notes only hemorrhoidal bleeding. She has not been to pool therapy recently but would like to restart to relieve joint pain. Leg edema has resolved.   She received her first dose of COVID-19 vaccine 3 weeks ago. She notes side effects of nausea, dizziness, and fatigued. She will get her second dose on 06/20/2019.   Past Medical History:  Diagnosis Date  .  Abdominal pain   . Anxiety and depression   . Atypical facial pain   . Bilateral occipital neuralgia   . Cervical spondylosis without myelopathy   . Chronic daily headache   . Chronic pain   . Chronic prostatitis   . Chronic thoracic back pain   . Depression   . Diabetes mellitus without complication ([place])   . Diabetes mellitus, type II ([place])   . Dysphagia   . Dysrhythmia   . Fibromyalgia   . Hyperlipidemia   . Hypertension   . Insomnia   . Iron deficiency anemia   . Left hip pain   . Low back pain   . Migraines   . Numbness   . Optic neuropathy   . OSA (obstructive sleep apnea)   . Polyarthralgia   . Primary osteoarthritis of both hips   . Prothrombin gene mutation ([place])   . Pseudotumor cerebri   . Rectal bleeding   . Right shoulder pain   . Rotator cuff syndrome   . Stroke ([place])   . Thyroid disease   . TIA (transient ischemic attack) 05/27/2017    Past Surgical History:  Procedure Laterality Date  . ABDOMINAL HYSTERECTOMY     total  . CHOLECYSTECTOMY    . COLONOSCOPY WITH PROPOFOL N/A 03/21/2018   Procedure: COLONOSCOPY WITH PROPOFOL;  Surgeon: Lucilla Lame, MD;  [facility] ENDOSCOPY;  Service: Endoscopy;  Laterality: N/A;  . ESOPHAGOGASTRODUODENOSCOPY (EGD) WITH PROPOFOL N/A 03/21/2018   Procedure: ESOPHAGOGASTRODUODENOSCOPY (EGD) WITH PROPOFOL;  Surgeon: Allen Norris,  Darren, MD;  Location: [facility] ENDOSCOPY;  Service: Endoscopy;  Laterality: N/A;  . JOINT REPLACEMENT    . KNEE SURGERY Left   . ROTATOR CUFF REPAIR Right   . spinal neurostimulator      Family History  Problem Relation Age of Onset  . Diabetes Mother   . Hyperlipidemia Mother   . Hypertension Mother   . COPD Mother   . CVA Mother   . Anemia Mother   . Diabetes Father   . Hyperlipidemia Father   . Hypertension Father   . Thyroid disease Father   . Alcohol abuse Father   . Peripheral vascular disease Sister   . Hypertension Sister   . Anxiety disorder Son   . Depression Son   . Bipolar  disorder Son   . Seizures Son     Social History:  reports that she has never smoked. She has never used smokeless tobacco. She reports that she does not drink alcohol or use drugs. She has been on disability since 2013 to difficulty with ambulation and fibromyalgia. She lives in [place]. The patient is alone today.  Allergies:  Allergies  Allergen Reactions  . Amoxicillin Itching  . Amoxapine Itching  . Dapagliflozin     Other reaction(s): Other (See Comments) Thrush & vaginal itching  . Keflex [Cephalexin] Itching  . Mirtazapine Other (See Comments)    Pt states felling "like my body is outside of me."    Current Medications: Current Outpatient Medications  Medication Sig Dispense Refill  . ACCU-CHEK AVIVA PLUS test strip     . ACCU-CHEK SOFTCLIX LANCETS lancets     . acetaZOLAMIDE (DIAMOX) 125 MG tablet Take 125 mg by mouth daily.     Marland Kitchen AIMOVIG 70 MG/ML SOAJ Inject 70 mg into the skin every 28 (twenty-eight) days.    Marland Kitchen albuterol (VENTOLIN HFA) 108 (90 Base) MCG/ACT inhaler Inhale 2 puffs into the lungs every 6 (six) hours as needed for wheezing or shortness of breath. 1 g 0  . ARIPiprazole (ABILIFY) 5 MG tablet Take 1 tablet (5 mg total) by mouth daily. 90 tablet 0  . atorvastatin (LIPITOR) 80 MG tablet Take 80 mg by mouth daily at 6 PM.    . Blood Glucose Monitoring Suppl (ACCU-CHEK AVIVA PLUS) w/Device KIT     . Carboxymethylcellulose Sod PF (REFRESH PLUS) 0.5 % SOLN Apply to eye.    . Cholecalciferol (VITAMIN D3) 1000 units CAPS Take 1 capsule by mouth daily.    . cyclobenzaprine (FLEXERIL) 5 MG tablet Take 5 mg by mouth 3 (three) times daily as needed.     . dabigatran (PRADAXA) 150 MG CAPS capsule Take 150 mg by mouth 2 (two) times daily.    Marland Kitchen diltiazem (CARDIZEM CD) 240 MG 24 hr capsule Take by mouth. Take 1 capsule by mouth once daily    . DULoxetine (CYMBALTA) 30 MG capsule Take 1 capsule (30 mg total) by mouth daily. To be combined with 60 mg 90 capsule 0  . DULoxetine  (CYMBALTA) 60 MG capsule Take 1 capsule (60 mg total) by mouth daily. To be combined with 30 mg 90 capsule 0  . esomeprazole (NEXIUM) 40 MG capsule Take 40 mg by mouth daily.    . ferrous sulfate 325 (65 FE) MG tablet Take by mouth. Take 325 mg daily with breakfast    . fluticasone (FLONASE) 50 MCG/ACT nasal spray Place 2 sprays into both nostrils as needed.     . lamoTRIgine (LAMICTAL) 100 MG tablet Take  1 tablet (100 mg total) by mouth daily. To be combined with 25 mg 90 tablet 0  . lamoTRIgine (LAMICTAL) 25 MG tablet TAKE ONE TABLET BY MOUTH DAILY. TO BE COMBINED WITH 100 MG. 90 tablet 0  . levothyroxine (SYNTHROID, LEVOTHROID) 100 MCG tablet Take 100 mcg by mouth daily.    Marland Kitchen loratadine (CLARITIN) 10 MG tablet Take 10 mg by mouth daily as needed for allergies.    . Magnesium 250 MG TABS Take 1 tablet by mouth daily.     . meloxicam (MOBIC) 7.5 MG tablet Take 7.5 mg by mouth daily.     . metFORMIN (GLUCOPHAGE-XR) 500 MG 24 hr tablet Take 500 mg by mouth 2 (two) times daily.     Gean [place]chwood ER 100 MG 12 hr tablet Take 1 tablet by mouth 2 (two) times daily.    . pioglitazone (ACTOS) 15 MG tablet Take by mouth. Take 1 tablet by mouth once daily    . quinapril (ACCUPRIL) 10 MG tablet Take 10 mg by mouth daily.    . Rimegepant Sulfate (NURTEC) 75 MG TBDP Take 1 tablet by mouth as needed.     . rizatriptan (MAXALT) 10 MG tablet Take 1 tab at migraine onset. May repeat in 2 hours prn.  Limit to 2 in 24 hours and to 2 days weekly.    . traZODone (DESYREL) 100 MG tablet Take 1.5-2 tablets (150-200 mg total) by mouth at bedtime as needed for sleep. 180 tablet 0  . TRESIBA FLEXTOUCH 200 UNIT/ML SOPN Inject 32 Units into the skin at bedtime.     Marland Kitchen aspirin-acetaminophen-caffeine (EXCEDRIN MIGRAINE) 250-250-65 MG tablet Take 2 tablets by mouth every 8 (eight) hours as needed for headache or migraine.     . baclofen (LIORESAL) 10 MG tablet TAKE 1 TABLET BY MOUTH ONCE DAILY AS NEEDED FOR UP TO 30 DAYS. MAY TAKE 2  TABLETS IF NEEDED    . clotrimazole (LOTRIMIN) 1 % cream     . hydrOXYzine (VISTARIL) 25 MG capsule TAKE (1) CAPSULE BY MOUTH TWICE DAILY ASNEEDED FOR SEVERA ANXIETY ATTACKS (Patient not taking: Reported on 06/17/2019) 180 capsule 0  . naloxone (NARCAN) nasal spray 4 mg/0.1 mL 1 spray into nose for opioid overdose. If needed, may repeat spray in 2-3 min    . ondansetron (ZOFRAN) 4 MG tablet TAKE (1) TABLET BY MOUTH EVERY 8 HOURS AS NEEDED FOR NAUSEA AND VOMITING     No current facility-administered medications for this visit.    Review of Systems  Constitutional: Positive for malaise/fatigue (x 1 month). Negative for chills, diaphoresis, fever and weight loss (up 17 pounds since 06/08/2018).       Feels "alright".  Sleeping majority of the day.  No energy.  HENT: Negative.  Negative for congestion, ear pain, hearing loss, nosebleeds, sinus pain and sore throat.   Eyes: Negative.  Negative for blurred vision, double vision and photophobia.  Respiratory: Positive for shortness of breath (exertional). Negative for cough, hemoptysis and wheezing.   Cardiovascular: Negative.  Negative for chest pain, palpitations, orthopnea, leg swelling and PND.  Gastrointestinal: Negative.  Negative for abdominal pain, blood in stool, constipation, diarrhea, melena, nausea (s/p COVID-19 vaccine) and vomiting.  Genitourinary: Negative.  Negative for dysuria, frequency, hematuria and urgency.  Musculoskeletal: Positive for joint pain (Osteoarthritis in hips- undergoing pool therapy) and myalgias (fibromyalgia). Negative for back pain.  Skin: Negative.  Negative for rash.  Neurological: Negative for dizziness (s/p COVID-19 vaccine), tingling, sensory change, weakness and headaches (chronic).  Restless legs.  Endo/Heme/Allergies: Bruises/bleeds easily (on Pradaxa- hemorrhoidal bleeding).       Diabetes.  Thyroid disease on Synthroid.  Psychiatric/Behavioral: Negative.  Negative for depression, memory loss and  substance abuse. The patient is not nervous/anxious and does not have insomnia.   All other systems reviewed and are negative.  Performance status (ECOG): 1  Vitals Blood pressure 114/71, pulse 74, temperature (!) 96.9 F (36.1 C), temperature source Tympanic, resp. rate 16, weight 249 lb 5.4 oz (113.1 kg), SpO2 98 %.   Physical Exam  Constitutional: She is oriented to person, place, and time. She appears well-developed and well-nourished. No distress.  HENT:  Head: Normocephalic and atraumatic.  Mouth/Throat: Oropharynx is clear and moist. No oropharyngeal exudate.  Long red/auburn hair.  Mask.  Eyes: Pupils are equal, round, and reactive to light. Conjunctivae and EOM are normal. No scleral icterus.  Blue eyes.  Neck: No JVD present.  Cardiovascular: Normal rate, regular rhythm and normal heart sounds. Exam reveals no gallop.  No murmur heard. Pulmonary/Chest: Effort normal and breath sounds normal. No respiratory distress. She has no wheezes. She has no rales. She exhibits no tenderness.  Abdominal: Soft. Bowel sounds are normal. She exhibits no distension. There is no abdominal tenderness. There is no rebound and no guarding.  Musculoskeletal:        General: No tenderness or edema. Normal range of motion.     Cervical back: Normal range of motion and neck supple.  Lymphadenopathy:       Head (right side): No preauricular, no posterior auricular and no occipital adenopathy present.       Head (left side): No preauricular, no posterior auricular and no occipital adenopathy present.    She has no cervical adenopathy.    She has no axillary adenopathy.       Right: No inguinal and no supraclavicular adenopathy present.       Left: No inguinal and no supraclavicular adenopathy present.  Neurological: She is alert and oriented to person, place, and time.  Skin: Skin is warm and dry. No rash noted. She is not diaphoretic. No erythema. No pallor.  Psychiatric: She has a normal mood and  affect. Her behavior is normal. Judgment and thought content normal.  Nursing note and vitals reviewed.   No visits with results within 3 Day(s) from this visit.  Latest known visit with results is:  Appointment on 06/12/2019  Component Date Value Ref Range Status  . Iron 06/12/2019 62  28 - 170 ug/dL Final  . TIBC 06/12/2019 484* 250 - 450 ug/dL Final  . Saturation Ratios 06/12/2019 13  10.4 - 31.8 % Final  . UIBC 06/12/2019 422  ug/dL Final   Performed at [facility], [address] [place], [place] [address]  . Ferritin 06/12/2019 20  11 - 307 ng/mL Final   Performed at [facility], [address] [place], [place] [address]  . WBC 06/12/2019 8.6  4.0 - 10.5 K/uL Final  . RBC 06/12/2019 4.79  3.87 - 5.11 MIL/uL Final  . Hemoglobin 06/12/2019 13.7  12.0 - 15.0 g/dL Final  . HCT 06/12/2019 41.9  36.0 - 46.0 % Final  . MCV 06/12/2019 87.5  80.0 - 100.0 fL Final  . MCH 06/12/2019 28.6  26.0 - 34.0 pg Final  . MCHC 06/12/2019 32.7  30.0 - 36.0 g/dL Final  . RDW 06/12/2019 13.2  11.5 - 15.5 % Final  . Platelets 06/12/2019 215  150 - 400 K/uL Final  .  nRBC 06/12/2019 0.0  0.0 - 0.2 % Final  . Neutrophils Relative % 06/12/2019 57  % Final  . Neutro Abs 06/12/2019 4.9  1.7 - 7.7 K/uL Final  . Lymphocytes Relative 06/12/2019 34  % Final  . Lymphs Abs 06/12/2019 2.9  0.7 - 4.0 K/uL Final  . Monocytes Relative 06/12/2019 7  % Final  . Monocytes Absolute 06/12/2019 0.6  0.1 - 1.0 K/uL Final  . Eosinophils Relative 06/12/2019 1  % Final  . Eosinophils Absolute 06/12/2019 0.1  0.0 - 0.5 K/uL Final  . Basophils Relative 06/12/2019 1  % Final  . Basophils Absolute 06/12/2019 0.0  0.0 - 0.1 K/uL Final  . Immature Granulocytes 06/12/2019 0  % Final  . Abs Immature Granulocytes 06/12/2019 0.02  0.00 - 0.07 K/uL Final   Performed at [facility], [address] [place], [place] [address]    Assessment:  Christine Mcneil is a 54 y.o. female with iron deficiency  anemiaof unclear etiology. She denies any melena, hematochezia, hematuria or melena. Dietis fairly good.  Work-up on 08/22/2019revealed a hematocrit of 38.2, hemoglobin 11.8, MCV 73.1, platelets 320,000, and white count 8100. Reticulocyte count was 1.4%. Ferritinwas 10. Iron studies included a saturation of 55% and a TIBC of 576. Normal studies included: B12, folate, SPEP, intrinsic factor antibodies, and anti-parietal antibodies.  Urinalysis on 06/08/2018 revealed no hematuria.  She received Venoferweekly x 4 (01/20/2018 - 02/09/2018), 03/14/2018, and 10/10/2018.  Ferritinhas been followed: 10 on 01/12/2018, 7 on 01/18/2018, 48 on 03/07/2018, 53 on 06/07/2018, 17 on 09/06/2018, 17 on 10/05/2018, 88 on 12/07/2018, and 20 on 06/12/2019.  EGDon 03/21/2018 was normal. Colonoscopyon 03/21/2018 revealed for 1 to 2 mm polyps in the sigmoid colon and internal hemorrhoids. Pathology revealed hyperplastic polyps.  She has history of cerebral vein thrombosis(2014) and TIA. She has aprothrombin gene mutation. She is on long term anticoagulation with Pradaxa.  She received the COVID-19 vaccine 3 weeks ago.  Symptomatically, she feels tired.  She notes hemorrhoidal bleeding.  Exam is stable.  Plan: 1.   Review labs from 06/12/2019. 2.Iron deficiency anemia Hematocrit 41.9.  Hemoglobin 13.7.  MCV 87.5. Ferritin 20 with an iron saturation of 13% and a TIBC 484 (high). Urinalysis revealed no hematuria.               She notes hemorrhoidal bleeding only. Readdress consideration of capsule study- note to Dr Allen Norris. Ferritin goal 100.  Patient treated if ferritin< 30.   Venofer today and weekly x 1 (total 2). 3.RTC in 3 months for labs (CBC, ferritin).   4.   RTC in 6 months for MD assessment, labs (CBC with diff, ferritin-day before) and +/- Venofer.  I discussed the assessment and treatment plan with the patient.   The patient was provided an opportunity to ask questions and all were answered.  The patient agreed with the plan and demonstrated an understanding of the instructions.  The patient was advised to call back if the symptoms worsen or if the condition fails to improve as anticipated.   Lequita Asal, MD, PhD    06/18/2019, 1:10 PM     I, Selena Batten, am acting as a scribe for Lequita Asal, MD.  I, Willards Mike Gip, MD, have reviewed the above documentation for accuracy and completeness, and I agree with the above.

## 2019-06-17 ENCOUNTER — Encounter: Payer: Self-pay | Admitting: Hematology and Oncology

## 2019-06-17 ENCOUNTER — Other Ambulatory Visit: Payer: Self-pay

## 2019-06-17 NOTE — Progress Notes (Signed)
The patient c/o lower back pain ( pain level 5) occ.

## 2019-06-18 ENCOUNTER — Inpatient Hospital Stay: Payer: Medicare Other

## 2019-06-18 ENCOUNTER — Encounter: Payer: Self-pay | Admitting: Hematology and Oncology

## 2019-06-18 ENCOUNTER — Inpatient Hospital Stay (HOSPITAL_BASED_OUTPATIENT_CLINIC_OR_DEPARTMENT_OTHER): Payer: Medicare Other | Admitting: Hematology and Oncology

## 2019-06-18 VITALS — BP 102/65 | HR 71 | Temp 97.0°F | Resp 18

## 2019-06-18 VITALS — BP 114/71 | HR 74 | Temp 96.9°F | Resp 16 | Wt 249.3 lb

## 2019-06-18 DIAGNOSIS — D509 Iron deficiency anemia, unspecified: Secondary | ICD-10-CM

## 2019-06-18 DIAGNOSIS — D508 Other iron deficiency anemias: Secondary | ICD-10-CM

## 2019-06-18 MED ORDER — SODIUM CHLORIDE 0.9 % IV SOLN
Freq: Once | INTRAVENOUS | Status: AC
  Filled 2019-06-18: qty 250

## 2019-06-18 MED ORDER — SODIUM CHLORIDE 0.9 % IV SOLN: 200.0000 mg | Freq: Once | INTRAVENOUS | Status: DC

## 2019-06-18 MED ORDER — IRON SUCROSE 20 MG/ML IV SOLN
200.0000 mg | Freq: Once | INTRAVENOUS | Status: AC
  Administered 2019-06-18: 200 mg via INTRAVENOUS
  Filled 2019-06-18: qty 10

## 2019-06-18 NOTE — Progress Notes (Signed)
This encounter was created in error - please disregard.

## 2019-06-18 NOTE — Patient Instructions (Signed)

## 2019-06-25 ENCOUNTER — Inpatient Hospital Stay: Payer: Medicare Other | Attending: Hematology and Oncology

## 2019-06-28 DIAGNOSIS — R609 Edema, unspecified: Secondary | ICD-10-CM | POA: Insufficient documentation

## 2019-06-28 DIAGNOSIS — R600 Localized edema: Secondary | ICD-10-CM | POA: Insufficient documentation

## 2019-06-28 DIAGNOSIS — I480 Paroxysmal atrial fibrillation: Secondary | ICD-10-CM | POA: Insufficient documentation

## 2019-07-10 ENCOUNTER — Other Ambulatory Visit: Payer: Self-pay

## 2019-07-10 ENCOUNTER — Ambulatory Visit (INDEPENDENT_AMBULATORY_CARE_PROVIDER_SITE_OTHER): Payer: Medicare Other | Admitting: Psychiatry

## 2019-07-10 ENCOUNTER — Encounter: Payer: Self-pay | Admitting: Psychiatry

## 2019-07-10 DIAGNOSIS — F411 Generalized anxiety disorder: Secondary | ICD-10-CM

## 2019-07-10 DIAGNOSIS — F3341 Major depressive disorder, recurrent, in partial remission: Secondary | ICD-10-CM

## 2019-07-10 DIAGNOSIS — F5105 Insomnia due to other mental disorder: Secondary | ICD-10-CM

## 2019-07-10 DIAGNOSIS — F4310 Post-traumatic stress disorder, unspecified: Secondary | ICD-10-CM | POA: Diagnosis not present

## 2019-07-10 MED ORDER — LAMOTRIGINE 25 MG PO TABS: ORAL_TABLET | ORAL | 0 refills | Status: DC

## 2019-07-10 MED ORDER — HYDROXYZINE PAMOATE 25 MG PO CAPS: ORAL_CAPSULE | ORAL | 0 refills | Status: DC

## 2019-07-10 MED ORDER — LAMOTRIGINE 100 MG PO TABS: 100.0000 mg | ORAL_TABLET | Freq: Every day | ORAL | 0 refills | Status: DC

## 2019-07-10 MED ORDER — ARIPIPRAZOLE 5 MG PO TABS: 5.0000 mg | ORAL_TABLET | Freq: Every day | ORAL | 0 refills | Status: DC

## 2019-07-10 NOTE — Progress Notes (Signed)
Provider Location : [facility] Patient Location : Home  Virtual Visit via Telephone Note  I connected with Christine Mcneil on 07/10/19 at  4:40 PM EST by telephone and verified that I am speaking with the correct person using two identifiers.   I discussed the limitations, risks, security and privacy concerns of performing an evaluation and management service by telephone and the availability of in person appointments. I also discussed with the patient that there may be a patient responsible charge related to this service. The patient expressed understanding and agreed to proceed.       I discussed the assessment and treatment plan with the patient. The patient was provided an opportunity to ask questions and all were answered. The patient agreed with the plan and demonstrated an understanding of the instructions.   The patient was advised to call back or seek an in-person evaluation if the symptoms worsen or if the condition fails to improve as anticipated.  [facility] MD OP Progress Note  07/10/2019 4:49 PM Christine Mcneil  MRN:  [id number]  Chief Complaint:  Chief Complaint    Follow-up     HPI: Christine Mcneil is a 54 year old Caucasian female, divorced, lives in [place], and [place], has a history of MDD, PTSD, insomnia, OSA-resolved, history of CVA, history of prothrombin gene mutation, chronic pain, migraine headaches, iron deficiency was evaluated by phone today.  Patient preferred to do a phone call.  Patient today reports she is currently making progress with regards to her depressive symptoms.  She reports she feels more motivated and her sadness have improved.  She is sleeping better.  She reports she got melatonin over-the-counter and that helps her.  She is compliant on her medications as prescribed.  Patient denies any side effects.  Patient denies any other concerns today. Visit Diagnosis:    ICD-10-CM   1. MDD (major depressive disorder), recurrent, in partial remission (HCC)  F33.41  lamoTRIgine (LAMICTAL) 25 MG tablet    lamoTRIgine (LAMICTAL) 100 MG tablet    ARIPiprazole (ABILIFY) 5 MG tablet  2. PTSD (post-traumatic stress disorder)  F43.10 lamoTRIgine (LAMICTAL) 25 MG tablet    ARIPiprazole (ABILIFY) 5 MG tablet  3. GAD (generalized anxiety disorder)  F41.1 hydrOXYzine (VISTARIL) 25 MG capsule  4. Insomnia due to mental disorder  F51.05     Past Psychiatric History: I have reviewed past psychiatric history from my progress note on 08/16/2017.  Past trials of Effexor, Klonopin.  Past Medical History:  Past Medical History:  Diagnosis Date  . Abdominal pain   . Anxiety and depression   . Atypical facial pain   . Bilateral occipital neuralgia   . Cervical spondylosis without myelopathy   . Chronic daily headache   . Chronic pain   . Chronic prostatitis   . Chronic thoracic back pain   . Depression   . Diabetes mellitus without complication ([place])   . Diabetes mellitus, type II ([place])   . Dysphagia   . Dysrhythmia   . Fibromyalgia   . Hyperlipidemia   . Hypertension   . Insomnia   . Iron deficiency anemia   . Left hip pain   . Low back pain   . Migraines   . Numbness   . Optic neuropathy   . OSA (obstructive sleep apnea)   . Polyarthralgia   . Primary osteoarthritis of both hips   . Prothrombin gene mutation ([place])   . Pseudotumor cerebri   . Rectal bleeding   . Right shoulder pain   .  Rotator cuff syndrome   . Stroke ([place])   . Thyroid disease   . TIA (transient ischemic attack) 05/27/2017    Past Surgical History:  Procedure Laterality Date  . ABDOMINAL HYSTERECTOMY     total  . CHOLECYSTECTOMY    . COLONOSCOPY WITH PROPOFOL N/A 03/21/2018   Procedure: COLONOSCOPY WITH PROPOFOL;  Surgeon: Lucilla Lame, MD;  Location: [facility] ENDOSCOPY;  Service: Endoscopy;  Laterality: N/A;  . ESOPHAGOGASTRODUODENOSCOPY (EGD) WITH PROPOFOL N/A 03/21/2018   Procedure: ESOPHAGOGASTRODUODENOSCOPY (EGD) WITH PROPOFOL;  Surgeon: Lucilla Lame, MD;  Location: [facility]  ENDOSCOPY;  Service: Endoscopy;  Laterality: N/A;  . JOINT REPLACEMENT    . KNEE SURGERY Left   . ROTATOR CUFF REPAIR Right   . spinal neurostimulator      Family Psychiatric History: I have reviewed family psychiatric history from my progress note on 08/16/2017.  Family History:  Family History  Problem Relation Age of Onset  . Diabetes Mother   . Hyperlipidemia Mother   . Hypertension Mother   . COPD Mother   . CVA Mother   . Anemia Mother   . Diabetes Father   . Hyperlipidemia Father   . Hypertension Father   . Thyroid disease Father   . Alcohol abuse Father   . Peripheral vascular disease Sister   . Hypertension Sister   . Anxiety disorder Son   . Depression Son   . Bipolar disorder Son   . Seizures Son     Social History: Reviewed social history from my progress note on 08/16/2017. Social History   Socioeconomic History  . Marital status: Divorced    Spouse name: Not on file  . Number of children: 1  . Years of education: Not on file  . Highest education level: High school graduate  Occupational History    Comment: disablitiy  Tobacco Use  . Smoking status: Never Smoker  . Smokeless tobacco: Never Used  Substance and Sexual Activity  . Alcohol use: No  . Drug use: No  . Sexual activity: Not Currently  Other Topics Concern  . Not on file  Social History Narrative  . Not on file   Social Determinants of Health   Financial Resource Strain:   . Difficulty of Paying Living Expenses: Not on file  Food Insecurity:   . Worried About Charity fundraiser in the Last Year: Not on file  . Ran Out of Food in the Last Year: Not on file  Transportation Needs:   . Lack of Transportation (Medical): Not on file  . Lack of Transportation (Non-Medical): Not on file  Physical Activity:   . Days of Exercise per Week: Not on file  . Minutes of Exercise per Session: Not on file  Stress:   . Feeling of Stress : Not on file  Social Connections:   . Frequency of  Communication with Friends and Family: Not on file  . Frequency of Social Gatherings with Friends and Family: Not on file  . Attends Religious Services: Not on file  . Active Member of Clubs or Organizations: Not on file  . Attends Archivist Meetings: Not on file  . Marital Status: Not on file    Allergies:  Allergies  Allergen Reactions  . Amoxicillin Itching  . Amoxapine Itching  . Dapagliflozin     Other reaction(s): Other (See Comments) Thrush & vaginal itching  . Keflex [Cephalexin] Itching  . Mirtazapine Other (See Comments)    Pt states felling "like my body is outside  of me."    Metabolic Disorder Labs: Lab Results  Component Value Date   HGBA1C 9.9 (H) 06/21/2017   MPG 237.43 06/21/2017   Lab Results  Component Value Date   PROLACTIN 5.5 09/06/2017   Lab Results  Component Value Date   CHOL 277 (H) 06/21/2017   TRIG 196 (H) 06/21/2017   HDL 64 06/21/2017   CHOLHDL 4.3 06/21/2017   VLDL 39 06/21/2017   LDLCALC 174 (H) 06/21/2017   Lab Results  Component Value Date   TSH 2.890 09/06/2017   TSH 3.176 01/04/2017    Therapeutic Level Labs: No results found for: LITHIUM No results found for: VALPROATE No components found for:  CBMZ  Current Medications: Current Outpatient Medications  Medication Sig Dispense Refill  . furosemide (LASIX) 20 MG tablet Take by mouth.    . Insulin Degludec (TRESIBA FLEXTOUCH) 200 UNIT/ML SOPN Inject into the skin.    . metFORMIN (GLUCOPHAGE-XR) 500 MG 24 hr tablet     . ACCU-CHEK AVIVA PLUS test strip     . ACCU-CHEK SOFTCLIX LANCETS lancets     . acetaZOLAMIDE (DIAMOX) 125 MG tablet Take 125 mg by mouth daily.     Marland Kitchen AIMOVIG 70 MG/ML SOAJ Inject 70 mg into the skin every 28 (twenty-eight) days.    Marland Kitchen albuterol (VENTOLIN HFA) 108 (90 Base) MCG/ACT inhaler Inhale 2 puffs into the lungs every 6 (six) hours as needed for wheezing or shortness of breath. 1 g 0  . ARIPiprazole (ABILIFY) 5 MG tablet Take 1 tablet (5 mg  total) by mouth daily. 90 tablet 0  . aspirin-acetaminophen-caffeine (EXCEDRIN MIGRAINE) 250-250-65 MG tablet Take 2 tablets by mouth every 8 (eight) hours as needed for headache or migraine.     Marland Kitchen atorvastatin (LIPITOR) 80 MG tablet Take 80 mg by mouth daily at 6 PM.    . baclofen (LIORESAL) 10 MG tablet TAKE 1 TABLET BY MOUTH ONCE DAILY AS NEEDED FOR UP TO 30 DAYS. MAY TAKE 2 TABLETS IF NEEDED    . Blood Glucose Monitoring Suppl (ACCU-CHEK AVIVA PLUS) w/Device KIT     . Carboxymethylcellulose Sod PF (REFRESH PLUS) 0.5 % SOLN Apply to eye.    . Cholecalciferol (VITAMIN D3) 1000 units CAPS Take 1 capsule by mouth daily.    . clotrimazole (LOTRIMIN) 1 % cream     . cyclobenzaprine (FLEXERIL) 5 MG tablet Take 5 mg by mouth 3 (three) times daily as needed.     . dabigatran (PRADAXA) 150 MG CAPS capsule Take 150 mg by mouth 2 (two) times daily.    Marland Kitchen diltiazem (CARDIZEM CD) 240 MG 24 hr capsule Take by mouth. Take 1 capsule by mouth once daily    . DULoxetine (CYMBALTA) 30 MG capsule Take 1 capsule (30 mg total) by mouth daily. To be combined with 60 mg 90 capsule 0  . DULoxetine (CYMBALTA) 60 MG capsule Take 1 capsule (60 mg total) by mouth daily. To be combined with 30 mg 90 capsule 0  . esomeprazole (NEXIUM) 40 MG capsule Take 40 mg by mouth daily.    . ferrous sulfate 325 (65 FE) MG tablet Take by mouth. Take 325 mg daily with breakfast    . fluticasone (FLONASE) 50 MCG/ACT nasal spray Place 2 sprays into both nostrils as needed.     . hydrOXYzine (VISTARIL) 25 MG capsule TAKE (1) CAPSULE BY MOUTH TWICE DAILY ASNEEDED FOR SEVERA ANXIETY ATTACKS 180 capsule 0  . lamoTRIgine (LAMICTAL) 100 MG tablet Take 1 tablet (100  mg total) by mouth daily. To be combined with 25 mg 90 tablet 0  . lamoTRIgine (LAMICTAL) 25 MG tablet Take by mouth.    . lamoTRIgine (LAMICTAL) 25 MG tablet TAKE ONE TABLET BY MOUTH DAILY. TO BE COMBINED WITH 100 MG. 90 tablet 0  . levothyroxine (SYNTHROID, LEVOTHROID) 100 MCG tablet  Take 100 mcg by mouth daily.    Marland Kitchen loratadine (CLARITIN) 10 MG tablet Take 10 mg by mouth daily as needed for allergies.    . Magnesium (V-R MAGNESIUM) 250 MG TABS Take by mouth.    . Magnesium 250 MG TABS Take 1 tablet by mouth daily.     . meloxicam (MOBIC) 7.5 MG tablet Take 7.5 mg by mouth daily.     . metFORMIN (GLUCOPHAGE-XR) 500 MG 24 hr tablet Take 500 mg by mouth 2 (two) times daily.     . naloxone (NARCAN) nasal spray 4 mg/0.1 mL 1 spray into nose for opioid overdose. If needed, may repeat spray in 2-3 min    . NUCYNTA ER 100 MG 12 hr tablet Take 1 tablet by mouth 2 (two) times daily.    . ondansetron (ZOFRAN) 4 MG tablet TAKE (1) TABLET BY MOUTH EVERY 8 HOURS AS NEEDED FOR NAUSEA AND VOMITING    . pioglitazone (ACTOS) 15 MG tablet Take by mouth. Take 1 tablet by mouth once daily    . quinapril (ACCUPRIL) 10 MG tablet Take 10 mg by mouth daily.    . Rimegepant Sulfate (NURTEC) 75 MG TBDP Take 1 tablet by mouth as needed.     . rizatriptan (MAXALT) 10 MG tablet Take 1 tab at migraine onset. May repeat in 2 hours prn.  Limit to 2 in 24 hours and to 2 days weekly.    . traZODone (DESYREL) 100 MG tablet Take 1.5-2 tablets (150-200 mg total) by mouth at bedtime as needed for sleep. 180 tablet 0  . TRESIBA FLEXTOUCH 200 UNIT/ML SOPN Inject 32 Units into the skin at bedtime.      No current facility-administered medications for this visit.     Musculoskeletal: Strength & Muscle Tone: UTA Gait & Station: Reports as WNL Patient leans: N/A  Psychiatric Specialty Exam: Review of Systems  Psychiatric/Behavioral: Negative for agitation, behavioral problems, confusion, decreased concentration, dysphoric mood, hallucinations, self-injury, sleep disturbance and suicidal ideas. The patient is not nervous/anxious and is not hyperactive.   All other systems reviewed and are negative.   There were no vitals taken for this visit.There is no height or weight on file to calculate BMI.  General  Appearance: UTA  Eye Contact:  UTA  Speech:  Clear and Coherent  Volume:  Normal  Mood:  Euthymic  Affect:  UTA  Thought Process:  Goal Directed and Descriptions of Associations: Intact  Orientation:  Full (Time, Place, and Person)  Thought Content: Logical   Suicidal Thoughts:  No  Homicidal Thoughts:  No  Memory:  Immediate;   Fair Recent;   Fair Remote;   Fair  Judgement:  Fair  Insight:  Fair  Psychomotor Activity:  UTA  Concentration:  Concentration: Fair and Attention Span: Fair  Recall:  [employer] of Knowledge: Fair  Language: Fair  Akathisia:  No  Handed:  Right  AIMS (if indicated): UTA  Assets:  Communication Skills Desire for Improvement Housing Social Support  ADL's:  Intact  Cognition: WNL  Sleep:  Fair   Screenings:   Assessment and Plan: Neoma Laming is a 54 year old Caucasian female who has a  history of MDD, PTSD, chronic pain, migraine headaches, history of CVA, iron deficiency, prothrombin gene mutation was evaluated by telemedicine today.  Patient is currently making progress on the current medication regimen.  Patient will continue to benefit from medication management.  Plan as noted below.  Plan MDD in partial remission Cymbalta 90 mg p.o. daily Lamictal 125 mg p.o. daily  GAD-improving Cymbalta 90 mg p.o. daily Hydroxyzine 25 mg p.o. daily as needed for anxiety attacks Patient encouraged to restart psychotherapy sessions as needed.  PTSD-stable CBT as needed  Insomnia-improving Trazodone 150 to 200 mg p.o. nightly Melatonin 3 to 9 mg p.o. nightly  Follow-up in clinic in 2 months or sooner if needed.  April 19 at 4:40 pm  I have spent atleast 20 minutes non face to face with patient today. More than 50 % of the time was spent for ordering medications and test ,psychoeducation and supportive psychotherapy and care coordination,as well as documenting clinical information in electronic health record. This note was generated in part or whole with  voice recognition software. Voice recognition is usually quite accurate but there are transcription errors that can and very often do occur. I apologize for any typographical errors that were not detected and corrected.        Ursula Alert, MD 07/10/2019, 4:49 PM

## 2019-07-11 ENCOUNTER — Telehealth: Payer: Self-pay

## 2019-07-11 NOTE — Telephone Encounter (Signed)
needs rx for lamtrigine 150mg  take 1/2 tablet by mouth once daily,  diltiazem hcl er 240mg  take 1 po qd, and then the meloxicam 7.5mg  take 2 tablets by mouth

## 2019-07-11 NOTE — Telephone Encounter (Signed)
needs to speak with you about the changes to medications.  [phone].

## 2019-07-11 NOTE — Telephone Encounter (Signed)
Attempted to call patient to discuss medication concern - mail box full, could not leave VM.  I am not responsible for her meloxicam,. Diltiazem.  Lamictal dosage was changed and was sent to pharmacy.

## 2019-07-12 NOTE — Telephone Encounter (Signed)
Pharmacy had already been notified

## 2019-08-09 ENCOUNTER — Other Ambulatory Visit: Payer: Self-pay | Admitting: Otolaryngology

## 2019-08-09 ENCOUNTER — Ambulatory Visit
Admission: RE | Admit: 2019-08-09 | Discharge: 2019-08-09 | Disposition: A | Discharge status: Home / Self Care | Payer: Medicare Other | Source: Ambulatory Visit | Attending: Otolaryngology | Admitting: Otolaryngology

## 2019-08-09 ENCOUNTER — Other Ambulatory Visit: Payer: Self-pay

## 2019-08-09 ENCOUNTER — Ambulatory Visit
Admission: RE | Admit: 2019-08-09 | Discharge: 2019-08-09 | Disposition: A | Discharge status: Home / Self Care | Payer: Medicare Other | Attending: Otolaryngology | Admitting: Otolaryngology

## 2019-08-09 DIAGNOSIS — R042 Hemoptysis: Secondary | ICD-10-CM | POA: Diagnosis present

## 2019-08-13 ENCOUNTER — Telehealth: Payer: Self-pay

## 2019-08-13 ENCOUNTER — Telehealth: Payer: Self-pay | Admitting: Gastroenterology

## 2019-08-13 NOTE — Telephone Encounter (Signed)
Attempted to return patient's call. Left VM requesting callback.

## 2019-08-13 NOTE — Telephone Encounter (Signed)
-----   Message from Secundino Ginger sent at 08/13/2019  2:53 PM EDT ----- Regarding: Spitting up blood Contact: [phone] Patient left a msg that she is spitting up blood and wondered if she needed an appt or what dr to see?

## 2019-08-13 NOTE — Telephone Encounter (Signed)
Pt left vm stating she needs an apt  With Dr. Allen Norris because she is spitting up blood. Left vm for her to return our call

## 2019-08-13 NOTE — Telephone Encounter (Signed)
Pt called again we scheduled apt for 09/24/19 please call pt she is still spitting up blood

## 2019-08-13 NOTE — Telephone Encounter (Signed)
LVM for pt to return my call.

## 2019-08-13 NOTE — Telephone Encounter (Signed)
Pt left vm stating she is spitting up blood

## 2019-08-15 ENCOUNTER — Encounter: Payer: Self-pay | Admitting: Gastroenterology

## 2019-08-15 ENCOUNTER — Other Ambulatory Visit: Payer: Self-pay

## 2019-08-15 ENCOUNTER — Ambulatory Visit (INDEPENDENT_AMBULATORY_CARE_PROVIDER_SITE_OTHER): Payer: Medicare Other | Admitting: Gastroenterology

## 2019-08-15 ENCOUNTER — Other Ambulatory Visit
Admission: RE | Admit: 2019-08-15 | Discharge: 2019-08-15 | Disposition: A | Discharge status: Home / Self Care | Payer: Medicare Other | Attending: Gastroenterology | Admitting: Gastroenterology

## 2019-08-15 DIAGNOSIS — R042 Hemoptysis: Secondary | ICD-10-CM | POA: Diagnosis present

## 2019-08-15 LAB — HEMOGLOBIN: Hemoglobin: 13.6 g/dL (ref 12.0–15.0)

## 2019-08-15 NOTE — Telephone Encounter (Signed)
Pt was scheduled for a virtual appt with Dr. Bonna Gains today, 08/15/19.

## 2019-08-15 NOTE — Patient Instructions (Addendum)
Your [facility]/2019 [facility]  Nothing to eat or drank 3 hours before scan.

## 2019-08-15 NOTE — Progress Notes (Signed)
[name] [address]  [place], [place] [address]  Main: [phone]  Fax: [phone]   Gastroenterology Consultation  Referring Provider:     Doughton, Latricia Heft, MD Primary Care Physician:  Doughton, Latricia Heft, MD Reason for Consultation:     hemoptysis        HPI:   Virtual Visit via Telephone Note  I connected with patient on 08/15/19 at  1:15 PM EDT by telephone and verified that I am speaking with the correct person using two identifiers.   I discussed the limitations, risks, security and privacy concerns of performing an evaluation and management service by telephone and the availability of in person appointments. I also discussed with the patient that there may be a patient responsible charge related to this service. The patient expressed understanding and agreed to proceed.  Location of the patient: Home Location of provider: Home Participating persons: Patient and provider only   History of Present Illness: Chief Complaint  Patient presents with  . Nausea    Patient states she has some nausea all the time   . spitting up blood    Has been going on for 1 month      Christine Mcneil is a 54 y.o. y/o female referred for consultation & management  by Dr. Rene Paci, Latricia Heft, MD.  Patient reports 1 to 55-monthhistory of blood streaks in the sputum every morning.  Denies any nausea or vomiting or hematemesis.  States was seen by ENT for this and had a laryngoscopy which was negative.  We do not have the records from this.  No dysphagia.  No heartburn.  No weight loss.  No abdominal pain.  No blood in stool should  Had an upper endoscopy and colonoscopy in October 2019 with Dr. WAllen Norrisfor iron deficiency anemia that showed a normal EGD, and benign colon polyps with internal hemorrhoids.  Past Medical History:  Diagnosis Date  . Abdominal pain   . Anxiety and depression   . Atypical facial pain   . Bilateral occipital neuralgia   . Cervical spondylosis  without myelopathy   . Chronic daily headache   . Chronic pain   . Chronic prostatitis   . Chronic thoracic back pain   . Depression   . Diabetes mellitus without complication ([facility]   . Diabetes mellitus, type II ([facility]   . Dysphagia   . Dysrhythmia   . Fibromyalgia   . Hyperlipidemia   . Hypertension   . Insomnia   . Iron deficiency anemia   . Left hip pain   . Low back pain   . Migraines   . Numbness   . Optic neuropathy   . OSA (obstructive sleep apnea)   . Polyarthralgia   . Primary osteoarthritis of both hips   . Prothrombin gene mutation ([place]   . Pseudotumor cerebri   . Rectal bleeding   . Right shoulder pain   . Rotator cuff syndrome   . Stroke ([facility]   . Thyroid disease   . TIA (transient ischemic attack) 05/27/2017    Past Surgical History:  Procedure Laterality Date  . ABDOMINAL HYSTERECTOMY     total  . CHOLECYSTECTOMY    . COLONOSCOPY WITH PROPOFOL N/A 03/21/2018   Procedure: COLONOSCOPY WITH PROPOFOL;  Surgeon: WLucilla Lame MD;  [facility];  Service: Endoscopy;  Laterality: N/A;  . ESOPHAGOGASTRODUODENOSCOPY (EGD) WITH PROPOFOL N/A 03/21/2018   Procedure: ESOPHAGOGASTRODUODENOSCOPY (EGD) WITH PROPOFOL;  Surgeon: WLucilla Lame  MD;  Location: [facility] ENDOSCOPY;  Service: Endoscopy;  Laterality: N/A;  . JOINT REPLACEMENT    . KNEE SURGERY Left   . ROTATOR CUFF REPAIR Right   . spinal neurostimulator      Prior to Admission medications   Medication Sig Start Date End Date Taking? Authorizing Provider  ACCU-CHEK AVIVA PLUS test strip  03/27/18  Yes [provider]  ACCU-CHEK SOFTCLIX LANCETS lancets  11/07/17  Yes [provider]  acetaZOLAMIDE (DIAMOX) 125 MG tablet Take 125 mg by mouth daily.  11/25/17  Yes [provider]  AIMOVIG 70 MG/ML SOAJ Inject 70 mg into the skin every 28 (twenty-eight) days. 06/15/17  Yes [provider]  albuterol (VENTOLIN HFA) 108 (90 Base) MCG/ACT inhaler Inhale 2 puffs into the  lungs every 6 (six) hours as needed for wheezing or shortness of breath. 03/08/19  Yes Gregor Hams, MD  ARIPiprazole (ABILIFY) 5 MG tablet Take 1 tablet (5 mg total) by mouth daily. 07/10/19  Yes Ursula Alert, MD  aspirin-acetaminophen-caffeine (EXCEDRIN MIGRAINE) [id number] MG tablet Take 2 tablets by mouth every 8 (eight) hours as needed for headache or migraine.    Yes [provider]  atorvastatin (LIPITOR) 80 MG tablet Take 80 mg by mouth daily at 6 PM. 12/22/17  Yes [provider]  baclofen (LIORESAL) 10 MG tablet TAKE 1 TABLET BY MOUTH ONCE DAILY AS NEEDED FOR UP TO 30 DAYS. MAY TAKE 2 TABLETS IF NEEDED 04/17/19  Yes [provider]  Blood Glucose Monitoring Suppl (ACCU-CHEK AVIVA PLUS) w/Device KIT  11/07/17  Yes [provider]  Carboxymethylcellulose Sod PF (REFRESH PLUS) 0.5 % SOLN Apply to eye. 05/15/15  Yes [provider]  Cholecalciferol (VITAMIN D3) 1000 units CAPS Take 1 capsule by mouth daily.   Yes [provider]  clotrimazole (LOTRIMIN) 1 % cream  12/05/17  Yes [provider]  cyclobenzaprine (FLEXERIL) 5 MG tablet Take 5 mg by mouth 3 (three) times daily as needed.  12/08/17  Yes [provider]  dabigatran (PRADAXA) 150 MG CAPS capsule Take 150 mg by mouth 2 (two) times daily. 10/12/16 08/15/19 Yes [provider]  diltiazem (CARDIZEM CD) 240 MG 24 hr capsule Take by mouth. Take 1 capsule by mouth once daily 12/26/17 08/15/19 Yes [provider]  DULoxetine (CYMBALTA) 30 MG capsule Take 1 capsule (30 mg total) by mouth daily. To be combined with 60 mg 06/13/19  Yes Eappen, Ria Clock, MD  DULoxetine (CYMBALTA) 60 MG capsule Take 1 capsule (60 mg total) by mouth daily. To be combined with 30 mg 06/13/19  Yes Eappen, Ria Clock, MD  esomeprazole (NEXIUM) 40 MG capsule Take 40 mg by mouth daily.   Yes [provider]  ferrous sulfate 325 (65 FE) MG tablet Take by mouth. Take 325 mg daily with  breakfast   Yes [provider]  fluticasone (FLONASE) 50 MCG/ACT nasal spray Place 2 sprays into both nostrils as needed.  06/25/17  Yes [provider]  furosemide (LASIX) 20 MG tablet Take by mouth. 06/26/19 06/25/20 Yes [provider]  hydrOXYzine (VISTARIL) 25 MG capsule TAKE (1) CAPSULE BY MOUTH TWICE DAILY ASNEEDED FOR SEVERA ANXIETY ATTACKS 07/10/19  Yes Eappen, Ria Clock, MD  Insulin Degludec (TRESIBA FLEXTOUCH) 200 UNIT/ML SOPN Inject into the skin. 11/22/18  Yes [provider]  lamoTRIgine (LAMICTAL) 100 MG tablet Take 1 tablet (100 mg total) by mouth daily. To be combined with 25 mg 07/10/19  Yes Ursula Alert, MD  lamoTRIgine (LAMICTAL)  25 MG tablet TAKE ONE TABLET BY MOUTH DAILY. TO BE COMBINED WITH 100 MG. 07/10/19  Yes Eappen, Saramma, MD  levothyroxine (SYNTHROID, LEVOTHROID) 100 MCG tablet Take 100 mcg by mouth daily. 08/18/15  Yes [provider]  loratadine (CLARITIN) 10 MG tablet Take 10 mg by mouth daily as needed for allergies.   Yes [provider]  Magnesium (V-R MAGNESIUM) 250 MG TABS Take by mouth.   Yes [provider]  Magnesium 250 MG TABS Take 1 tablet by mouth daily.    Yes [provider]  meloxicam (MOBIC) 7.5 MG tablet Take 7.5 mg by mouth daily.  09/22/18 09/22/19 Yes [provider]  metFORMIN (GLUCOPHAGE-XR) 500 MG 24 hr tablet Take 500 mg by mouth 2 (two) times daily.  02/17/18  Yes [provider]  naloxone (NARCAN) nasal spray 4 mg/0.1 mL 1 spray into nose for opioid overdose. If needed, may repeat spray in 2-3 min 03/14/18  Yes [provider]  NUCYNTA ER 100 MG 12 hr tablet Take 1 tablet by mouth 2 (two) times daily. 05/30/17  Yes [provider]  ondansetron (ZOFRAN) 4 MG tablet TAKE (1) TABLET BY MOUTH EVERY 8 HOURS AS NEEDED FOR NAUSEA AND VOMITING 07/20/18  Yes [provider]  pioglitazone (ACTOS) 15 MG tablet Take by mouth. Take 1 tablet by mouth once  daily 11/07/17 08/15/19 Yes [provider]  quinapril (ACCUPRIL) 10 MG tablet Take 10 mg by mouth daily. 05/01/13  Yes [provider]  Rimegepant Sulfate (NURTEC) 75 MG TBDP Take 1 tablet by mouth as needed.  01/12/19  Yes [provider]  traZODone (DESYREL) 100 MG tablet Take 1.5-2 tablets (150-200 mg total) by mouth at bedtime as needed for sleep. 06/13/19  Yes Eappen, Ria Clock, MD  TRESIBA FLEXTOUCH 200 UNIT/ML SOPN Inject 32 Units into the skin at bedtime.  11/22/18  Yes [provider]    Family History  Problem Relation Age of Onset  . Diabetes Mother   . Hyperlipidemia Mother   . Hypertension Mother   . COPD Mother   . CVA Mother   . Anemia Mother   . Diabetes Father   . Hyperlipidemia Father   . Hypertension Father   . Thyroid disease Father   . Alcohol abuse Father   . Peripheral vascular disease Sister   . Hypertension Sister   . Anxiety disorder Son   . Depression Son   . Bipolar disorder Son   . Seizures Son      Social History   Tobacco Use  . Smoking status: Never Smoker  . Smokeless tobacco: Never Used  Substance Use Topics  . Alcohol use: No  . Drug use: No    Allergies as of 08/15/2019 - Review Complete 08/15/2019  Allergen Reaction Noted  . Amoxicillin Itching   . Amoxapine Itching 09/16/2015  . Dapagliflozin  11/07/2017  . Keflex [cephalexin] Itching 09/16/2015  . Mirtazapine Other (See Comments) 06/08/2019    Review of Systems:    All systems reviewed and negative except where noted in HPI.   Observations/Objective:  Labs: CBC    Component Value Date/Time   WBC 8.6 06/12/2019 1451   RBC 4.79 06/12/2019 1451   HGB 13.6 08/15/2019 1422   HGB 10.5 (L) 03/22/2014 1659   HCT 41.9 06/12/2019 1451   HCT 35.5 03/22/2014 1659   PLT 215 06/12/2019 1451   PLT 330 03/22/2014 1659   MCV 87.5 06/12/2019 1451   MCV 74 (L) 03/22/2014 1659  MCH 28.6 06/12/2019 1451   MCHC 32.7 06/12/2019 1451   RDW 13.2 06/12/2019  1451   RDW 17.2 (H) 03/22/2014 1659   LYMPHSABS 2.9 06/12/2019 1451   LYMPHSABS 3.6 09/15/2013 0131   MONOABS 0.6 06/12/2019 1451   MONOABS 0.7 09/15/2013 0131   EOSABS 0.1 06/12/2019 1451   EOSABS 0.1 09/15/2013 0131   BASOSABS 0.0 06/12/2019 1451   BASOSABS 0.1 09/15/2013 0131   CMP     Component Value Date/Time   NA 134 (L) 01/12/2018 1022   NA 138 03/22/2014 1659   K 3.6 01/12/2018 1022   K 3.7 03/22/2014 1659   CL 102 01/12/2018 1022   CL 104 03/22/2014 1659   CO2 22 01/12/2018 1022   CO2 23 03/22/2014 1659   GLUCOSE 144 (H) 01/12/2018 1022   GLUCOSE 156 (H) 03/22/2014 1659   BUN 9 01/12/2018 1022   BUN 7 03/22/2014 1659   CREATININE 0.96 03/01/2019 1309   CREATININE 0.98 03/22/2014 1659   CALCIUM 9.2 01/12/2018 1022   CALCIUM 9.0 03/22/2014 1659   PROT 8.5 (H) 01/12/2018 1022   PROT 8.8 (H) 03/22/2014 1659   ALBUMIN 4.5 01/12/2018 1022   ALBUMIN 4.5 03/22/2014 1659   AST 25 01/12/2018 1022   AST 34 03/22/2014 1659   ALT 29 01/12/2018 1022   ALT 37 03/22/2014 1659   ALKPHOS 118 01/12/2018 1022   ALKPHOS 147 (H) 03/22/2014 1659   BILITOT 0.5 01/12/2018 1022   BILITOT 0.3 03/22/2014 1659   GFRNONAA >60 03/01/2019 1309   GFRNONAA >60 03/22/2014 1659   GFRNONAA >60 09/15/2013 0131   GFRAA >60 03/01/2019 1309   GFRAA >60 03/22/2014 1659   GFRAA >60 09/15/2013 0131    Imaging Studies: DG Chest 2 View  Result Date: 08/09/2019 CLINICAL DATA:  Hemoptysis EXAM: CHEST - 2 VIEW COMPARISON:  March 07, 2019 FINDINGS: Lungs are clear. The heart size and pulmonary vascularity are normal. No adenopathy. A stimulator extends posteriorly along the medial left back region. There are foci of degenerative change in the thoracic spine. IMPRESSION: Lungs clear.  Cardiac silhouette normal.  No adenopathy. Electronically Signed   By: Lowella Grip III M.D.   On: 08/09/2019 11:56    Assessment and Plan:   Christine Mcneil is a 54 y.o. y/o female has been referred for  hemoptysis  Assessment and Plan: Hemoptysis is not an indication for GI work-up.  Chest x-ray recently did not show any lesions.  Will refer to pulmonary for further evaluation for hemoptysis  Hemoglobin today is normal which is reassuring  Patient advised to go to the ER if hemoptysis worsens or she develops shortness of breath  Recent upper endoscopy is reassuring  Obtain esophagram to rule out any large lesions  Can consider upper endoscopy if esophagram is abnormal  Discuss capsule endoscopy on next visit due to iron deficiency anemia.  Follows with Dr. Mike Gip for the same  Follow Up Instructions:   I discussed the assessment and treatment plan with the patient. The patient was provided an opportunity to ask questions and all were answered. The patient agreed with the plan and demonstrated an understanding of the instructions.   The patient was advised to call back or seek an in-person evaluation if the symptoms worsen or if the condition fails to improve as anticipated.  I provided 13 minutes of non-face-to-face time during this encounter.   Virgel Manifold, MD  Speech recognition software was used to dictate the above note.

## 2019-08-16 ENCOUNTER — Ambulatory Visit (INDEPENDENT_AMBULATORY_CARE_PROVIDER_SITE_OTHER): Payer: Medicare Other | Admitting: Gastroenterology

## 2019-08-16 DIAGNOSIS — Z5329 Procedure and treatment not carried out because of patient's decision for other reasons: Secondary | ICD-10-CM

## 2019-08-20 ENCOUNTER — Ambulatory Visit
Admission: RE | Admit: 2019-08-20 | Discharge: 2019-08-20 | Disposition: A | Discharge status: Home / Self Care | Payer: Medicare Other | Source: Ambulatory Visit | Attending: Gastroenterology | Admitting: Gastroenterology

## 2019-08-20 ENCOUNTER — Other Ambulatory Visit: Payer: Self-pay

## 2019-08-20 ENCOUNTER — Other Ambulatory Visit: Payer: Self-pay | Admitting: Gastroenterology

## 2019-08-20 DIAGNOSIS — R042 Hemoptysis: Secondary | ICD-10-CM | POA: Diagnosis present

## 2019-08-21 MED ORDER — OMEPRAZOLE 20 MG PO CPDR: 20.0000 mg | DELAYED_RELEASE_CAPSULE | Freq: Every day | ORAL | 0 refills | Status: DC

## 2019-08-27 ENCOUNTER — Telehealth: Payer: Self-pay

## 2019-08-27 DIAGNOSIS — R042 Hemoptysis: Secondary | ICD-10-CM

## 2019-08-27 NOTE — Telephone Encounter (Signed)
Patient verbalized understanding of results. Did referral to pulmonary.

## 2019-08-27 NOTE — Telephone Encounter (Signed)
-----   Message from Virgel Manifold, MD sent at 08/21/2019  2:16 PM EDT ----- Ginger please let the patient know, her esophagram shows reflux. I have sent omeprazole to her pharmacy. We had referred her to pulmonology due to hemoptysis. Please make sure she has an appointment with them

## 2019-08-29 ENCOUNTER — Other Ambulatory Visit: Payer: Self-pay | Admitting: Gastroenterology

## 2019-09-04 ENCOUNTER — Encounter: Payer: Self-pay | Admitting: Gastroenterology

## 2019-09-05 ENCOUNTER — Other Ambulatory Visit: Payer: Self-pay

## 2019-09-05 ENCOUNTER — Encounter: Payer: Self-pay | Admitting: Gastroenterology

## 2019-09-05 ENCOUNTER — Ambulatory Visit (INDEPENDENT_AMBULATORY_CARE_PROVIDER_SITE_OTHER): Payer: Medicare Other | Admitting: Gastroenterology

## 2019-09-05 DIAGNOSIS — D509 Iron deficiency anemia, unspecified: Secondary | ICD-10-CM

## 2019-09-05 NOTE — Addendum Note (Signed)
Addended by: Ulyess Blossom L on: 09/05/2019 02:03 PM   Modules accepted: Orders

## 2019-09-05 NOTE — Progress Notes (Signed)
Christine Antigua, MD [address]  [place]  [place], [place] [address]  Main: [phone]  Fax: [phone]   Primary Care Physician: Doughton, Latricia Heft, MD  Virtual Visit via Telephone Note  I connected with patient on 09/05/19 at  1:30 PM EDT by telephone and verified that I am speaking with the correct person using two identifiers.   I discussed the limitations, risks, security and privacy concerns of performing an evaluation and management service by telephone and the availability of in person appointments. I also discussed with the patient that there may be a patient responsible charge related to this service. The patient expressed understanding and agreed to proceed.  Location of Patient: Home Location of Provider: Home Persons involved: Patient and provider only during the visit (nursing staff and front desk staff was involved in communicating with the patient prior to the appointment, reviewing medications and checking them in)   History of Present Illness: Chief Complaint  Patient presents with  . coughing up blood    Patient states she has been really tired. Patient is still spitting up blood every day   . Appointment    Has appointment with Pulmonology on 09/26/2019     HPI: Christine Mcneil is a 54 y.o. female with iron deficiency anemia here for follow-up.  Work-up for this included EGD and colonoscopy in 2019 which was unrevealing.  She has been on iron replacement by hematology but ferritin remains low.  She has never had a pill camera study.  Denies any diarrhea, blood in stool, melena, hematochezia, abdominal pain, nausea or vomiting.  On last visit she was referred to pulmonary for hemoptysis and she has an upcoming appointment with them.  Her chest x-ray has been unrevealing.  Upper GI series showed reflux but no large lesions in the esophagus to attribute to her hemoptysis.  Hemoglobin was normal  Current Outpatient Medications  Medication Sig Dispense  Refill  . ACCU-CHEK AVIVA PLUS test strip     . ACCU-CHEK SOFTCLIX LANCETS lancets     . acetaZOLAMIDE (DIAMOX) 125 MG tablet Take 125 mg by mouth daily.     Marland Kitchen AIMOVIG 70 MG/ML SOAJ Inject 70 mg into the skin every 28 (twenty-eight) days.    Marland Kitchen albuterol (VENTOLIN HFA) 108 (90 Base) MCG/ACT inhaler Inhale 2 puffs into the lungs every 6 (six) hours as needed for wheezing or shortness of breath. 1 g 0  . ARIPiprazole (ABILIFY) 5 MG tablet Take 1 tablet (5 mg total) by mouth daily. 90 tablet 0  . aspirin-acetaminophen-caffeine (EXCEDRIN MIGRAINE) 250-250-65 MG tablet Take 2 tablets by mouth every 8 (eight) hours as needed for headache or migraine.     Marland Kitchen atorvastatin (LIPITOR) 80 MG tablet Take 80 mg by mouth daily at 6 PM.    . baclofen (LIORESAL) 10 MG tablet TAKE 1 TABLET BY MOUTH ONCE DAILY AS NEEDED FOR UP TO 30 DAYS. MAY TAKE 2 TABLETS IF NEEDED    . Blood Glucose Monitoring Suppl (ACCU-CHEK AVIVA PLUS) w/Device KIT     . Carboxymethylcellulose Sod PF (REFRESH PLUS) 0.5 % SOLN Apply to eye.    . Cholecalciferol (VITAMIN D3) 1000 units CAPS Take 1 capsule by mouth daily.    . clotrimazole (LOTRIMIN) 1 % cream     . cyclobenzaprine (FLEXERIL) 5 MG tablet Take 5 mg by mouth 3 (three) times daily as needed.     . DULoxetine (CYMBALTA) 30 MG capsule Take 1 capsule (30 mg total) by mouth  daily. To be combined with 60 mg 90 capsule 0  . DULoxetine (CYMBALTA) 60 MG capsule Take 1 capsule (60 mg total) by mouth daily. To be combined with 30 mg 90 capsule 0  . esomeprazole (NEXIUM) 40 MG capsule Take 40 mg by mouth daily.    . ferrous sulfate 325 (65 FE) MG tablet Take by mouth. Take 325 mg daily with breakfast    . fluticasone (FLONASE) 50 MCG/ACT nasal spray Place 2 sprays into both nostrils as needed.     . furosemide (LASIX) 20 MG tablet Take by mouth.    . hydrOXYzine (VISTARIL) 25 MG capsule TAKE (1) CAPSULE BY MOUTH TWICE DAILY ASNEEDED FOR SEVERA ANXIETY ATTACKS 180 capsule 0  . Insulin Degludec  (TRESIBA FLEXTOUCH) 200 UNIT/ML SOPN Inject into the skin.    Marland Kitchen lamoTRIgine (LAMICTAL) 100 MG tablet Take 1 tablet (100 mg total) by mouth daily. To be combined with 25 mg 90 tablet 0  . lamoTRIgine (LAMICTAL) 25 MG tablet TAKE ONE TABLET BY MOUTH DAILY. TO BE COMBINED WITH 100 MG. 90 tablet 0  . levothyroxine (SYNTHROID, LEVOTHROID) 100 MCG tablet Take 100 mcg by mouth daily.    Marland Kitchen loratadine (CLARITIN) 10 MG tablet Take 10 mg by mouth daily as needed for allergies.    . Magnesium (V-R MAGNESIUM) 250 MG TABS Take by mouth.    . Magnesium 250 MG TABS Take 1 tablet by mouth daily.     . meloxicam (MOBIC) 7.5 MG tablet Take 7.5 mg by mouth daily.     . metFORMIN (GLUCOPHAGE-XR) 500 MG 24 hr tablet Take 500 mg by mouth 2 (two) times daily.     . naloxone (NARCAN) nasal spray 4 mg/0.1 mL 1 spray into nose for opioid overdose. If needed, may repeat spray in 2-3 min    . NUCYNTA ER 100 MG 12 hr tablet Take 1 tablet by mouth 2 (two) times daily.    Marland Kitchen omeprazole (PRILOSEC) 20 MG capsule TAKE (1) CAPSULE BY MOUTH EVERY DAY 30 capsule 0  . ondansetron (ZOFRAN) 4 MG tablet TAKE (1) TABLET BY MOUTH EVERY 8 HOURS AS NEEDED FOR NAUSEA AND VOMITING    . quinapril (ACCUPRIL) 10 MG tablet Take 10 mg by mouth daily.    . Rimegepant Sulfate (NURTEC) 75 MG TBDP Take 1 tablet by mouth as needed.     . traZODone (DESYREL) 100 MG tablet Take 1.5-2 tablets (150-200 mg total) by mouth at bedtime as needed for sleep. 180 tablet 0  . TRESIBA FLEXTOUCH 200 UNIT/ML SOPN Inject 32 Units into the skin at bedtime.     . dabigatran (PRADAXA) 150 MG CAPS capsule Take 150 mg by mouth 2 (two) times daily.    Marland Kitchen diltiazem (CARDIZEM CD) 240 MG 24 hr capsule Take by mouth. Take 1 capsule by mouth once daily    . pioglitazone (ACTOS) 15 MG tablet Take by mouth. Take 1 tablet by mouth once daily     No current facility-administered medications for this visit.    Allergies as of 09/05/2019 - Review Complete 09/05/2019  Allergen  Reaction Noted  . Amoxicillin Itching   . Amoxapine Itching 09/16/2015  . Dapagliflozin  11/07/2017  . Keflex [cephalexin] Itching 09/16/2015  . Mirtazapine Other (See Comments) 06/08/2019    Review of Systems:    All systems reviewed and negative except where noted in HPI.   Observations/Objective:  Labs: CMP     Component Value Date/Time   NA 134 (L) 01/12/2018 1022  NA 138 03/22/2014 1659   K 3.6 01/12/2018 1022   K 3.7 03/22/2014 1659   CL 102 01/12/2018 1022   CL 104 03/22/2014 1659   CO2 22 01/12/2018 1022   CO2 23 03/22/2014 1659   GLUCOSE 144 (H) 01/12/2018 1022   GLUCOSE 156 (H) 03/22/2014 1659   BUN 9 01/12/2018 1022   BUN 7 03/22/2014 1659   CREATININE 0.96 03/01/2019 1309   CREATININE 0.98 03/22/2014 1659   CALCIUM 9.2 01/12/2018 1022   CALCIUM 9.0 03/22/2014 1659   PROT 8.5 (H) 01/12/2018 1022   PROT 8.8 (H) 03/22/2014 1659   ALBUMIN 4.5 01/12/2018 1022   ALBUMIN 4.5 03/22/2014 1659   AST 25 01/12/2018 1022   AST 34 03/22/2014 1659   ALT 29 01/12/2018 1022   ALT 37 03/22/2014 1659   ALKPHOS 118 01/12/2018 1022   ALKPHOS 147 (H) 03/22/2014 1659   BILITOT 0.5 01/12/2018 1022   BILITOT 0.3 03/22/2014 1659   GFRNONAA >60 03/01/2019 1309   GFRNONAA >60 03/22/2014 1659   GFRNONAA >60 09/15/2013 0131   GFRAA >60 03/01/2019 1309   GFRAA >60 03/22/2014 1659   GFRAA >60 09/15/2013 0131   Lab Results  Component Value Date   WBC 8.6 06/12/2019   HGB 13.6 08/15/2019   HCT 41.9 06/12/2019   MCV 87.5 06/12/2019   PLT 215 06/12/2019    Imaging Studies: DG Chest 2 View  Result Date: 08/09/2019 CLINICAL DATA:  Hemoptysis EXAM: CHEST - 2 VIEW COMPARISON:  March 07, 2019 FINDINGS: Lungs are clear. The heart size and pulmonary vascularity are normal. No adenopathy. A stimulator extends posteriorly along the medial left back region. There are foci of degenerative change in the thoracic spine. IMPRESSION: Lungs clear.  Cardiac silhouette normal.  No  adenopathy. Electronically Signed   By: Lowella Grip III M.D.   On: 08/09/2019 11:56   DG ESOPHAGUS W DOUBLE CM (HD)  Result Date: 08/20/2019 CLINICAL DATA:  Hemoptysis, dysphagia EXAM: ESOPHOGRAM / BARIUM SWALLOW / BARIUM TABLET STUDY TECHNIQUE: Combined double contrast and single contrast examination performed using effervescent crystals, thick barium liquid, and thin barium liquid. The patient was observed with fluoroscopy swallowing a 13 mm barium sulphate tablet. FLUOROSCOPY TIME:  Fluoroscopy Time:  1 minutes 24 seconds Radiation Exposure Index (if provided by the fluoroscopic device): 54.6 mGy Number of Acquired Spot Images: 14 COMPARISON:  CT 12/12/2016 FINDINGS: Normal primary peristaltic wave. Occasional mild intermittent tertiary contractions. Esophagus distends normally. No persisting stricture. No discrete mass or ulceration. Spontaneous gastroesophageal reflux was evident to the level of the midesophagus. Normal positioning of the gastroesophageal junction without hiatal hernia. At the conclusion of the study, a 13 mm barium tablet was administered. This passed without delay into the stomach. IMPRESSION: 1. Spontaneous gastroesophageal reflux to the level of the midesophagus. 2. Occasional mild intermittent tertiary contractions. Otherwise unremarkable motility. 3. No persisting stricture or focal mucosal abnormality. Electronically Signed   By: Davina Poke D.O.   On: 08/20/2019 09:50    Assessment and Plan:   STARNISHA BATREZ is a 54 y.o. y/o female with iron deficiency anemia  Assessment and Plan: Scheduled for capsule study to complete iron deficiency anemia work-up.  EGD and colonoscopy were completed in 2019 but a follow-up of camera study was not done at that time  No signs of active GI bleeding  Follow-up with pulmonary for hemoptysis.  However, if this gets worse patient, or she develops shortness of breath, she was advised to go to  the ER  Follow Up Instructions:      I discussed the assessment and treatment plan with the patient. The patient was provided an opportunity to ask questions and all were answered. The patient agreed with the plan and demonstrated an understanding of the instructions.   The patient was advised to call back or seek an in-person evaluation if the symptoms worsen or if the condition fails to improve as anticipated.  I provided 12 minutes of non-face-to-face time during this encounter. Additional time was spent in reviewing patient's chart, placing orders etc.   Virgel Manifold, MD  Speech recognition software was used to dictate this note.

## 2019-09-10 ENCOUNTER — Telehealth (INDEPENDENT_AMBULATORY_CARE_PROVIDER_SITE_OTHER): Payer: Medicare Other | Admitting: Psychiatry

## 2019-09-10 ENCOUNTER — Other Ambulatory Visit: Payer: Self-pay

## 2019-09-10 ENCOUNTER — Encounter: Payer: Self-pay | Admitting: Psychiatry

## 2019-09-10 DIAGNOSIS — F411 Generalized anxiety disorder: Secondary | ICD-10-CM

## 2019-09-10 DIAGNOSIS — F4310 Post-traumatic stress disorder, unspecified: Secondary | ICD-10-CM | POA: Diagnosis not present

## 2019-09-10 DIAGNOSIS — F3342 Major depressive disorder, recurrent, in full remission: Secondary | ICD-10-CM | POA: Diagnosis not present

## 2019-09-10 DIAGNOSIS — F5105 Insomnia due to other mental disorder: Secondary | ICD-10-CM | POA: Diagnosis not present

## 2019-09-10 MED ORDER — TRAZODONE HCL 100 MG PO TABS: 150.0000 mg | ORAL_TABLET | Freq: Every evening | ORAL | 0 refills | Status: DC | PRN

## 2019-09-10 MED ORDER — HYDROXYZINE PAMOATE 25 MG PO CAPS: ORAL_CAPSULE | ORAL | 0 refills | Status: DC

## 2019-09-10 MED ORDER — LAMOTRIGINE 100 MG PO TABS: 100.0000 mg | ORAL_TABLET | Freq: Every day | ORAL | 0 refills | Status: DC

## 2019-09-10 MED ORDER — DULOXETINE HCL 30 MG PO CPEP: 30.0000 mg | ORAL_CAPSULE | Freq: Every day | ORAL | 0 refills | Status: DC

## 2019-09-10 MED ORDER — DULOXETINE HCL 60 MG PO CPEP: 60.0000 mg | ORAL_CAPSULE | Freq: Every day | ORAL | 0 refills | Status: DC

## 2019-09-10 MED ORDER — LAMOTRIGINE 25 MG PO TABS: ORAL_TABLET | ORAL | 0 refills | Status: DC

## 2019-09-10 NOTE — Progress Notes (Signed)
Provider Location : [facility] Patient Location : Home  Virtual Visit via Video Note  I connected with Christine Mcneil on 09/10/19 at  4:40 PM EDT by a video enabled telemedicine application and verified that I am speaking with the correct person using two identifiers.   I discussed the limitations of evaluation and management by telemedicine and the availability of in person appointments. The patient expressed understanding and agreed to proceed.    I discussed the assessment and treatment plan with the patient. The patient was provided an opportunity to ask questions and all were answered. The patient agreed with the plan and demonstrated an understanding of the instructions.   The patient was advised to call back or seek an in-person evaluation if the symptoms worsen or if the condition fails to improve as anticipated.   [place] MD OP Progress Note  09/10/2019 4:48 PM EYLEEN RAWLINSON  MRN:  [id number]  Chief Complaint:  Chief Complaint    Follow-up     HPI: Christine Mcneil is a 54 year old Caucasian female, divorced, lives in [place] , has a history of MDD, PTSD, insomnia, OSA-resolved, history of CVA, history of prothrombin gene mutation, chronic pain migraine headaches, iron deficiency was evaluated by telemedicine today.  Patient today reports she is currently doing well with regards to her mood symptoms.  She denies any significant sadness, crying spells or anxiety attacks.  She is sleep has improved.  She is able to sleep around 8 hours at night.  She is compliant on all her medications.  She denies side effects.  She denies suicidality, homicidality or perceptual disturbances.  She does report having GI problems, she has been coughing up blood on and off.  She is currently following up with GI specialist.  I have reviewed medical records in EHR for Dr. Van Clines has upcoming upper EGD as well as other work-up scheduled.  Patient denies any other concerns today. Visit Diagnosis:     ICD-10-CM   1. MDD (major depressive disorder), recurrent, in full remission ([place])  F33.42 lamoTRIgine (LAMICTAL) 100 MG tablet    lamoTRIgine (LAMICTAL) 25 MG tablet  2. PTSD (post-traumatic stress disorder)  F43.10 traZODone (DESYREL) 100 MG tablet    lamoTRIgine (LAMICTAL) 25 MG tablet    DULoxetine (CYMBALTA) 60 MG capsule    DULoxetine (CYMBALTA) 30 MG capsule  3. GAD (generalized anxiety disorder)  F41.1 hydrOXYzine (VISTARIL) 25 MG capsule  4. Insomnia due to mental disorder  F51.05     Past Psychiatric History: I have reviewed past psychiatric history from my progress note on 08/16/2017.  Past trials of Effexor, Klonopin.  Past Medical History:  Past Medical History:  Diagnosis Date  . Abdominal pain   . Anxiety and depression   . Atypical facial pain   . Bilateral occipital neuralgia   . Cervical spondylosis without myelopathy   . Chronic daily headache   . Chronic pain   . Chronic prostatitis   . Chronic thoracic back pain   . Depression   . Diabetes mellitus without complication ([place])   . Diabetes mellitus, type II ([place])   . Dysphagia   . Dysrhythmia   . Fibromyalgia   . Hyperlipidemia   . Hypertension   . Insomnia   . Iron deficiency anemia   . Left hip pain   . Low back pain   . Migraines   . Numbness   . Optic neuropathy   . OSA (obstructive sleep apnea)   . Polyarthralgia   . Primary osteoarthritis  of both hips   . Prothrombin gene mutation ([place])   . Pseudotumor cerebri   . Rectal bleeding   . Right shoulder pain   . Rotator cuff syndrome   . Stroke ([place])   . Thyroid disease   . TIA (transient ischemic attack) 05/27/2017    Past Surgical History:  Procedure Laterality Date  . ABDOMINAL HYSTERECTOMY     total  . CHOLECYSTECTOMY    . COLONOSCOPY WITH PROPOFOL N/A 03/21/2018   Procedure: COLONOSCOPY WITH PROPOFOL;  Surgeon: Lucilla Lame, MD;  Location: [facility] ENDOSCOPY;  Service: Endoscopy;  Laterality: N/A;  . ESOPHAGOGASTRODUODENOSCOPY (EGD) WITH  PROPOFOL N/A 03/21/2018   Procedure: ESOPHAGOGASTRODUODENOSCOPY (EGD) WITH PROPOFOL;  Surgeon: Lucilla Lame, MD;  Location: [facility] ENDOSCOPY;  Service: Endoscopy;  Laterality: N/A;  . JOINT REPLACEMENT    . KNEE SURGERY Left   . ROTATOR CUFF REPAIR Right   . spinal neurostimulator      Family Psychiatric History: I have reviewed family psychiatric history from my progress note on 08/16/2017.  Family History:  Family History  Problem Relation Age of Onset  . Diabetes Mother   . Hyperlipidemia Mother   . Hypertension Mother   . COPD Mother   . CVA Mother   . Anemia Mother   . Diabetes Father   . Hyperlipidemia Father   . Hypertension Father   . Thyroid disease Father   . Alcohol abuse Father   . Peripheral vascular disease Sister   . Hypertension Sister   . Anxiety disorder Son   . Depression Son   . Bipolar disorder Son   . Seizures Son     Social History: I have reviewed social history from my progress note on 08/16/2017. Social History   Socioeconomic History  . Marital status: Divorced    Spouse name: Not on file  . Number of children: 1  . Years of education: Not on file  . Highest education level: High school graduate  Occupational History    Comment: disablitiy  Tobacco Use  . Smoking status: Never Smoker  . Smokeless tobacco: Never Used  Substance and Sexual Activity  . Alcohol use: No  . Drug use: No  . Sexual activity: Not Currently  Other Topics Concern  . Not on file  Social History Narrative  . Not on file   Social Determinants of Health   Financial Resource Strain:   . Difficulty of Paying Living Expenses:   Food Insecurity:   . Worried About Charity fundraiser in the Last Year:   . Arboriculturist in the Last Year:   Transportation Needs:   . Film/video editor (Medical):   Marland Kitchen Lack of Transportation (Non-Medical):   Physical Activity:   . Days of Exercise per Week:   . Minutes of Exercise per Session:   Stress:   . Feeling of Stress :    Social Connections:   . Frequency of Communication with Friends and Family:   . Frequency of Social Gatherings with Friends and Family:   . Attends Religious Services:   . Active Member of Clubs or Organizations:   . Attends Archivist Meetings:   Marland Kitchen Marital Status:     Allergies:  Allergies  Allergen Reactions  . Amoxicillin Itching  . Amoxapine Itching  . Dapagliflozin     Other reaction(s): Other (See Comments) Thrush & vaginal itching  . Keflex [Cephalexin] Itching  . Mirtazapine Other (See Comments)    Pt states felling "like my  body is outside of me."    Metabolic Disorder Labs: Lab Results  Component Value Date   HGBA1C 9.9 (H) 06/21/2017   MPG 237.43 06/21/2017   Lab Results  Component Value Date   PROLACTIN 5.5 09/06/2017   Lab Results  Component Value Date   CHOL 277 (H) 06/21/2017   TRIG 196 (H) 06/21/2017   HDL 64 06/21/2017   CHOLHDL 4.3 06/21/2017   VLDL 39 06/21/2017   LDLCALC 174 (H) 06/21/2017   Lab Results  Component Value Date   TSH 2.890 09/06/2017   TSH 3.176 01/04/2017    Therapeutic Level Labs: No results found for: LITHIUM No results found for: VALPROATE No components found for:  CBMZ  Current Medications: Current Outpatient Medications  Medication Sig Dispense Refill  . ACCU-CHEK AVIVA PLUS test strip     . ACCU-CHEK SOFTCLIX LANCETS lancets     . acetaZOLAMIDE (DIAMOX) 125 MG tablet Take 125 mg by mouth daily.     Marland Kitchen AIMOVIG 70 MG/ML SOAJ Inject 70 mg into the skin every 28 (twenty-eight) days.    Marland Kitchen albuterol (VENTOLIN HFA) 108 (90 Base) MCG/ACT inhaler Inhale 2 puffs into the lungs every 6 (six) hours as needed for wheezing or shortness of breath. 1 g 0  . ARIPiprazole (ABILIFY) 5 MG tablet Take 1 tablet (5 mg total) by mouth daily. 90 tablet 0  . aspirin-acetaminophen-caffeine (EXCEDRIN MIGRAINE) 250-250-65 MG tablet Take 2 tablets by mouth every 8 (eight) hours as needed for headache or migraine.     Marland Kitchen atorvastatin  (LIPITOR) 80 MG tablet Take 80 mg by mouth daily at 6 PM.    . baclofen (LIORESAL) 10 MG tablet TAKE 1 TABLET BY MOUTH ONCE DAILY AS NEEDED FOR UP TO 30 DAYS. MAY TAKE 2 TABLETS IF NEEDED    . Blood Glucose Monitoring Suppl (ACCU-CHEK AVIVA PLUS) w/Device KIT     . Carboxymethylcellulose Sod PF (REFRESH PLUS) 0.5 % SOLN Apply to eye.    . Cholecalciferol (VITAMIN D3) 1000 units CAPS Take 1 capsule by mouth daily.    . clotrimazole (LOTRIMIN) 1 % cream     . cyclobenzaprine (FLEXERIL) 5 MG tablet Take 5 mg by mouth 3 (three) times daily as needed.     . dabigatran (PRADAXA) 150 MG CAPS capsule Take 150 mg by mouth 2 (two) times daily.    Marland Kitchen diltiazem (CARDIZEM CD) 240 MG 24 hr capsule Take by mouth. Take 1 capsule by mouth once daily    . DULoxetine (CYMBALTA) 30 MG capsule Take 1 capsule (30 mg total) by mouth daily. To be combined with 60 mg 90 capsule 0  . DULoxetine (CYMBALTA) 60 MG capsule Take 1 capsule (60 mg total) by mouth daily. To be combined with 30 mg 90 capsule 0  . esomeprazole (NEXIUM) 40 MG capsule Take 40 mg by mouth daily.    . ferrous sulfate 325 (65 FE) MG tablet Take by mouth. Take 325 mg daily with breakfast    . fluticasone (FLONASE) 50 MCG/ACT nasal spray Place 2 sprays into both nostrils as needed.     . furosemide (LASIX) 20 MG tablet Take by mouth.    . hydrOXYzine (VISTARIL) 25 MG capsule TAKE (1) CAPSULE BY MOUTH TWICE DAILY ASNEEDED FOR SEVERA ANXIETY ATTACKS 180 capsule 0  . Insulin Degludec (TRESIBA FLEXTOUCH) 200 UNIT/ML SOPN Inject into the skin.    Marland Kitchen lamoTRIgine (LAMICTAL) 100 MG tablet Take 1 tablet (100 mg total) by mouth daily. To be combined with  25 mg 90 tablet 0  . lamoTRIgine (LAMICTAL) 25 MG tablet TAKE ONE TABLET BY MOUTH DAILY. TO BE COMBINED WITH 100 MG. 90 tablet 0  . levothyroxine (SYNTHROID, LEVOTHROID) 100 MCG tablet Take 100 mcg by mouth daily.    Marland Kitchen loratadine (CLARITIN) 10 MG tablet Take 10 mg by mouth daily as needed for allergies.    .  Magnesium (V-R MAGNESIUM) 250 MG TABS Take by mouth.    . Magnesium 250 MG TABS Take 1 tablet by mouth daily.     . meloxicam (MOBIC) 7.5 MG tablet Take 7.5 mg by mouth daily.     . metFORMIN (GLUCOPHAGE-XR) 500 MG 24 hr tablet Take 500 mg by mouth 2 (two) times daily.     . naloxone (NARCAN) nasal spray 4 mg/0.1 mL 1 spray into nose for opioid overdose. If needed, may repeat spray in 2-3 min    . NUCYNTA ER 100 MG 12 hr tablet Take 1 tablet by mouth 2 (two) times daily.    Marland Kitchen omeprazole (PRILOSEC) 20 MG capsule TAKE (1) CAPSULE BY MOUTH EVERY DAY 30 capsule 0  . ondansetron (ZOFRAN) 4 MG tablet TAKE (1) TABLET BY MOUTH EVERY 8 HOURS AS NEEDED FOR NAUSEA AND VOMITING    . pioglitazone (ACTOS) 15 MG tablet Take by mouth. Take 1 tablet by mouth once daily    . quinapril (ACCUPRIL) 10 MG tablet Take 10 mg by mouth daily.    . Rimegepant Sulfate (NURTEC) 75 MG TBDP Take 1 tablet by mouth as needed.     . traZODone (DESYREL) 100 MG tablet Take 1.5-2 tablets (150-200 mg total) by mouth at bedtime as needed for sleep. 180 tablet 0  . TRESIBA FLEXTOUCH 200 UNIT/ML SOPN Inject 32 Units into the skin at bedtime.      No current facility-administered medications for this visit.     Musculoskeletal: Strength & Muscle Tone: UTA Gait & Station: normal Patient leans: N/A  Psychiatric Specialty Exam: Review of Systems  Gastrointestinal:       Coughing up blood on and off  Psychiatric/Behavioral: Negative for agitation, behavioral problems, confusion, decreased concentration, dysphoric mood, hallucinations, self-injury, sleep disturbance and suicidal ideas. The patient is not nervous/anxious and is not hyperactive.   All other systems reviewed and are negative.   There were no vitals taken for this visit.There is no height or weight on file to calculate BMI.  General Appearance: Casual  Eye Contact:  Fair  Speech:  Clear and Coherent  Volume:  Normal  Mood:  Euthymic  Affect:  Congruent  Thought  Process:  Goal Directed and Descriptions of Associations: Intact  Orientation:  Full (Time, Place, and Person)  Thought Content: Logical   Suicidal Thoughts:  No  Homicidal Thoughts:  No  Memory:  Immediate;   Fair Recent;   Fair Remote;   Fair  Judgement:  Fair  Insight:  Fair  Psychomotor Activity:  Normal  Concentration:  Concentration: Fair and Attention Span: Fair  Recall:  [employer] of Knowledge: Fair  Language: Fair  Akathisia:  No  Handed:  Right  AIMS (if indicated): UTA  Assets:  Communication Skills Desire for Improvement Housing Social Support  ADL's:  Intact  Cognition: WNL  Sleep:  Fair   Screenings:   Assessment and Plan: Christine Mcneil is a 54 year old Caucasian female who has a history of MDD, PTSD, chronic pain, migraine headaches, history of CVA, iron deficiency, prothrombin gene mutation was evaluated by telemedicine today.  Patient is currently  making progress on the current medication regimen.  Patient however has GI symptoms and will continue to follow-up with her gastroenterologist.  Plan as noted below.  Plan MDD in full remission Cymbalta 90 mg p.o. daily Lamictal 125 mg p.o. daily  GAD-stable Cymbalta 90 mg p.o. daily Hydroxyzine 25 mg p.o. daily as needed for anxiety attacks   PTSD-stable CBT as needed  Insomnia-stable Trazodone 150 to 200 mg p.o. nightly Melatonin 3 to 9 mg p.o. nightly  I have reviewed medical records in E HR per Dr. Bonna Gains   Follow-up in clinic in 3 months or sooner if needed.  I have spent atleast 20 minutes non face to face with patient today. More than 50 % of the time was spent for preparing to see the patient ( e.g., review of test, records ), ordering medications and test ,psychoeducation and supportive psychotherapy and care coordination,as well as documenting clinical information in electronic health record. This note was generated in part or whole with voice recognition software. Voice recognition is usually  quite accurate but there are transcription errors that can and very often do occur. I apologize for any typographical errors that were not detected and corrected.       Ursula Alert, MD 09/10/2019, 4:48 PM

## 2019-09-10 NOTE — Patient Instructions (Signed)
Follow-up in clinic on July 26 at 4:20 PM

## 2019-09-17 ENCOUNTER — Inpatient Hospital Stay: Payer: Medicare Other | Attending: Hematology and Oncology

## 2019-09-17 ENCOUNTER — Other Ambulatory Visit: Payer: Self-pay

## 2019-09-17 DIAGNOSIS — D509 Iron deficiency anemia, unspecified: Secondary | ICD-10-CM | POA: Diagnosis present

## 2019-09-17 DIAGNOSIS — D508 Other iron deficiency anemias: Secondary | ICD-10-CM

## 2019-09-17 LAB — CBC WITH DIFFERENTIAL/PLATELET
Abs Immature Granulocytes: 0.03 10*3/uL (ref 0.00–0.07)
Basophils Absolute: 0 10*3/uL (ref 0.0–0.1)
Basophils Relative: 1 %
Eosinophils Absolute: 0.2 10*3/uL (ref 0.0–0.5)
Eosinophils Relative: 3 %
HCT: 40.4 % (ref 36.0–46.0)
Hemoglobin: 13 g/dL (ref 12.0–15.0)
Immature Granulocytes: 0 %
Lymphocytes Relative: 40 %
Lymphs Abs: 2.7 10*3/uL (ref 0.7–4.0)
MCH: 28.9 pg (ref 26.0–34.0)
MCHC: 32.2 g/dL (ref 30.0–36.0)
MCV: 89.8 fL (ref 80.0–100.0)
Monocytes Absolute: 0.4 10*3/uL (ref 0.1–1.0)
Monocytes Relative: 6 %
Neutro Abs: 3.3 10*3/uL (ref 1.7–7.7)
Neutrophils Relative %: 50 %
Platelets: 221 10*3/uL (ref 150–400)
RBC: 4.5 MIL/uL (ref 3.87–5.11)
RDW: 13.7 % (ref 11.5–15.5)
WBC: 6.7 10*3/uL (ref 4.0–10.5)
nRBC: 0 % (ref 0.0–0.2)

## 2019-09-17 LAB — FERRITIN: Ferritin: 42 ng/mL (ref 11–307)

## 2019-09-19 ENCOUNTER — Other Ambulatory Visit: Payer: Self-pay

## 2019-09-19 ENCOUNTER — Ambulatory Visit: Payer: Medicare Other | Attending: Neurology

## 2019-09-19 ENCOUNTER — Encounter: Payer: Self-pay | Admitting: Physical Therapy

## 2019-09-19 DIAGNOSIS — R293 Abnormal posture: Secondary | ICD-10-CM

## 2019-09-19 DIAGNOSIS — M542 Cervicalgia: Secondary | ICD-10-CM

## 2019-09-19 DIAGNOSIS — M436 Torticollis: Secondary | ICD-10-CM

## 2019-09-19 NOTE — Therapy (Signed)
[facility] [address]. [place], [place], [address] Phone: [phone]   Fax:  [phone]  Physical Therapy Evaluation  Patient Details  Name: Christine Mcneil MRN: [id number] Date of Birth: 1965/08/11 No data recorded  Encounter Date: 09/19/2019  PT End of Session - 09/19/19 1325    Visit Number  1    Number of Visits  12    Date for PT Re-Evaluation  10/31/19    Authorization - Visit Number  1    Authorization - Number of Visits  10    Progress Note Due on Visit  10    PT Start Time  0900    PT Stop Time  1000    PT Time Calculation (min)  60 min    Activity Tolerance  Patient tolerated treatment well;Patient limited by pain    Behavior During Therapy  [facility] for tasks assessed/performed       Past Medical History:  Diagnosis Date  . Abdominal pain   . Anxiety and depression   . Atypical facial pain   . Bilateral occipital neuralgia   . Cervical spondylosis without myelopathy   . Chronic daily headache   . Chronic pain   . Chronic prostatitis   . Chronic thoracic back pain   . Depression   . Diabetes mellitus without complication ([place])   . Diabetes mellitus, type II ([place])   . Dysphagia   . Dysrhythmia   . Fibromyalgia   . Hyperlipidemia   . Hypertension   . Insomnia   . Iron deficiency anemia   . Left hip pain   . Low back pain   . Migraines   . Numbness   . Optic neuropathy   . OSA (obstructive sleep apnea)   . Polyarthralgia   . Primary osteoarthritis of both hips   . Prothrombin gene mutation ([place])   . Pseudotumor cerebri   . Rectal bleeding   . Right shoulder pain   . Rotator cuff syndrome   . Stroke ([place])   . Thyroid disease   . TIA (transient ischemic attack) 05/27/2017    Past Surgical History:  Procedure Laterality Date  . ABDOMINAL HYSTERECTOMY     total  . CHOLECYSTECTOMY    . COLONOSCOPY WITH PROPOFOL N/A 03/21/2018   Procedure: COLONOSCOPY WITH PROPOFOL;  Surgeon: Lucilla Lame, MD;   Location: [facility] ENDOSCOPY;  Service: Endoscopy;  Laterality: N/A;  . ESOPHAGOGASTRODUODENOSCOPY (EGD) WITH PROPOFOL N/A 03/21/2018   Procedure: ESOPHAGOGASTRODUODENOSCOPY (EGD) WITH PROPOFOL;  Surgeon: Lucilla Lame, MD;  Location: [facility] ENDOSCOPY;  Service: Endoscopy;  Laterality: N/A;  . JOINT REPLACEMENT    . KNEE SURGERY Left   . ROTATOR CUFF REPAIR Right   . spinal neurostimulator      There were no vitals filed for this visit.   Subjective Assessment - 09/19/19 0910    Subjective  Pt c/o neck catching sensation/pain when she turns her head L>R; feels like stiffness in the morning.  Also notes some headaches in forehead/top of head.  The neck stiffness/catching began about a month ago.  She reports nothing is alleviating her sx's.  She saw her neurologist and was referred to PT.  Sleeping on her R side, turning her head L>R aggravate her sx.  She denies any dizziness/vertigo/vision changes with headaches, but states she has some radiating sx's from L neck into shoulder regoin and sometimes her "arm feels numb" when she sleeps in certain positions.    Pertinent History  She  has a spinal stimulator for her occipital nerve since 2014.  She had this done for occiptal headaches.    Limitations  Sitting    Patient Stated Goals  to be able to turn her head to look behind her and to be able to sleep without being limited by neck pain.    Currently in Pain?  Yes    Pain Score  5     Pain Location  Neck    Pain Orientation  Left    Pain Onset  More than a month ago    Pain Frequency  Intermittent        PT Evaluation Objective Findings: FOTO score 54  Observation: pt sits with increased thoracic kyphosis and increased lower cervical spine kyphosis, scar noted along posterior cervical spine from previous stimulator procedure in 2014  Shoulder AROM: WNL, pt notes some discomfort with shoulder flexion and abd at end ranges  Cervical Spine AROM: (degrees) Flexion 35 Ext 25 Rotation R  50 Rotation L 40* (*all painful, L is the most painful) PROM: not painful, firm end feel rotation R and L, pt reports relief of sx's with passive upper cervical flexion (+) relief with manual traction  Strength: UE Myotome testing 5/5 b/l 5/5 Shoulder IR/ER MMT b/l Pt not able to perform scapular retraction movement without excessive UT activation  (+) tender to palpation/tightness noted in L UT, lev scap, upper cervical mm, pec bilaterally L>R (+) hypomobility C3-5 with UPA b/l  Equal and in tact UE dermatome sensation    Objective measurements completed on examination: See above findings.         PT Education - 09/19/19 1325    Education Details  HEP; Access Code: [email or web address] (posture awareness, scap retraction)    Person(s) Educated  Patient    Methods  Explanation;Demonstration;Handout    Comprehension  Verbalized understanding;Returned demonstration       PT Short Term Goals - 09/19/19 1338      PT SHORT TERM GOAL #1   Title  Pt will improve cervical AROM rotation L x 5 degrees    Baseline  L 40, R 50    Time  3    Period  Weeks    Status  New      PT SHORT TERM GOAL #2   Title  pt will demonstrate sitting/sleeping posture that promote optimal cervical spine mechanics and pain <2/10    Baseline  pt lacks awareness    Time  3    Period  Weeks    Status  New        PT Long Term Goals - 09/19/19 1340      PT LONG TERM GOAL #1   Title  Pt will be independent with HEP for postural mm/UE/neck strength    Baseline  unable to perform scap retraction    Time  6    Period  Weeks    Status  New      PT LONG TERM GOAL #2   Title  Pt. will increase FOTO from 54 to 70 to improve functional mobility.    Baseline  54    Time  6    Period  Weeks    Status  New      PT LONG TERM GOAL #3   Title  Pt will improve L cervical spine AROM rotation >10 deg to facilitate being able to turn head while driving    Baseline  R 40 deg    Time  6    Period  Weeks    Status   New             Plan - 09/19/19 1330    Clinical Impression Statement  Pt presents to PT today with c/o neck pain L>R.  Impairements include: decreased neck ROM, decreased postural mm strength, abnormal posture, postural mm imbalance (upper cross syndrome pattern) with (+) overactive pec/UT/upper cervical extensor mm and underactive upper cervical flexors and low scap mm.  She is an appropriate candidate for PT to address these impairments and facilitate return to PLOF.    Examination-Activity Limitations  Sleep;Sit    Stability/Clinical Decision Making  Stable/Uncomplicated    Clinical Decision Making  Low    Rehab Potential  Good    PT Frequency  2x / week    PT Duration  6 weeks    PT Treatment/Interventions  ADLs/Self Care Home Management;Cryotherapy;Electrical Stimulation;Traction;Moist Heat;Functional mobility training;Therapeutic activities;Therapeutic exercise;Neuromuscular re-education;Manual techniques;Patient/family education;Passive range of motion    PT Next Visit Plan  postural mm retraining, PROM, man tx    PT Home Exercise Plan  Access Code: [email or web address]    Consulted and Agree with Plan of Care  Patient       Patient will benefit from skilled therapeutic intervention in order to improve the following deficits and impairments:  Improper body mechanics, Pain, Postural dysfunction, Decreased activity tolerance, Decreased endurance, Decreased range of motion, Decreased strength, Impaired flexibility, Decreased safety awareness, Obesity, Impaired UE functional use, Increased muscle spasms  Visit Diagnosis: Neck pain  Neck stiffness  Abnormal posture     Problem List Patient Active Problem List   Diagnosis Date Noted  . MDD (major depressive disorder), recurrent, in full remission ([place]) 09/10/2019  . Peripheral edema 06/28/2019  . Paroxysmal atrial fibrillation ([place]) 06/28/2019  . Episodic tension-type headache, not intractable 06/13/2019  . Atypical angina ([place])  06/06/2019  . Lumbar radiculopathy 02/21/2019  . MDD (major depressive disorder), recurrent episode, moderate ([place]) 11/28/2018  . PTSD (post-traumatic stress disorder) 11/28/2018  . GAD (generalized anxiety disorder) 11/28/2018  . Insomnia due to mental disorder 11/28/2018  . Severe obesity (BMI >= 40) ([place]) 11/27/2018  . Polyp of sigmoid colon   . Iron deficiency anemia 01/19/2018  . 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 TIA (transient ischemic attack) 06/20/2017  . Primary osteoarthritis of both hips 06/20/2017  . Periodic limb movements of sleep 05/30/2017  . Shortness of breath 01/03/2017  . 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 Chronic prostatitis/chronic pelvic pain syndrome 05/28/2016  . Migraine without aura and without status migrainosus, not intractable 05/11/2016  . Chronic anticoagulation 03/29/2016  . Encounter for monitoring opioid maintenance therapy 11/04/2015  . Dysphagia, neurologic 09/16/2015  . Numbness 09/16/2015  . Anti-cardiolipin antibody positive 09/01/2015  . Prothrombin gene mutation ([place]) 09/01/2015  . H/O: CVA (cerebrovascular accident) 06/27/2015  . Anemia, iron deficiency 10/02/2014  . Chronic thoracic back pain 07/24/2014  . Bilateral occipital neuralgia 04/11/2014  . Hypothyroidism, unspecified 10/17/2013  . Type 2 diabetes, HbA1c goal < 7% (H[facility]) 10/17/2013  . Cervicogenic headache 07/04/2013  . Cervical spondylosis without myelopathy 11/16/2012  . Rotator cuff syndrome 11/13/2012  . Sinus congestion 11/07/2012  . Abdominal pain 09/28/2012  . Rectal bleeding 09/28/2012  . Depression 02/22/2012  . GERD (gastroesophageal reflux disease) 02/22/2012  . Essential hypertension 02/22/2012  . Obesity, unspecified 02/22/2012  . Neuromuscular disorder ([place]) 02/22/2012  . Pain in joint, pelvic region and thigh 01/17/2012  . Insomnia  secondary to chronic pain 10/04/2011  . Right shoulder pain 10/04/2011  . Atypical  facial pain 10/03/2011  . Chronic daily headache 10/03/2011  . Fibromyalgia 10/03/2011  . Hyperlipidemia, unspecified 10/03/2011    Pincus Badder 09/19/2019, 1:44 PM  Merdis Delay, PT, DPT    [facility] [address] [place], [place], [address] Phone: [phone]   Fax:  [phone]  Name: Christine Mcneil MRN: [id number] Date of Birth: May 19, 1966

## 2019-09-22 ENCOUNTER — Other Ambulatory Visit: Payer: Self-pay

## 2019-09-22 DIAGNOSIS — T383X5A Adverse effect of insulin and oral hypoglycemic [antidiabetic] drugs, initial encounter: Secondary | ICD-10-CM | POA: Diagnosis not present

## 2019-09-22 DIAGNOSIS — E119 Type 2 diabetes mellitus without complications: Secondary | ICD-10-CM | POA: Insufficient documentation

## 2019-09-22 DIAGNOSIS — Z7984 Long term (current) use of oral hypoglycemic drugs: Secondary | ICD-10-CM | POA: Insufficient documentation

## 2019-09-22 DIAGNOSIS — R1084 Generalized abdominal pain: Secondary | ICD-10-CM | POA: Diagnosis present

## 2019-09-22 DIAGNOSIS — R112 Nausea with vomiting, unspecified: Secondary | ICD-10-CM | POA: Insufficient documentation

## 2019-09-22 DIAGNOSIS — I10 Essential (primary) hypertension: Secondary | ICD-10-CM | POA: Insufficient documentation

## 2019-09-22 LAB — COMPREHENSIVE METABOLIC PANEL
ALT: 73 U/L — ABNORMAL HIGH (ref 0–44)
AST: 69 U/L — ABNORMAL HIGH (ref 15–41)
Albumin: 4.4 g/dL (ref 3.5–5.0)
Alkaline Phosphatase: 168 U/L — ABNORMAL HIGH (ref 38–126)
Anion gap: 12 (ref 5–15)
BUN: 18 mg/dL (ref 6–20)
CO2: 26 mmol/L (ref 22–32)
Calcium: 10.3 mg/dL (ref 8.9–10.3)
Chloride: 100 mmol/L (ref 98–111)
Creatinine, Ser: 1.23 mg/dL — ABNORMAL HIGH (ref 0.44–1.00)
GFR calc Af Amer: 58 mL/min — ABNORMAL LOW (ref >60)
GFR calc non Af Amer: 50 mL/min — ABNORMAL LOW (ref >60)
Glucose, Bld: 141 mg/dL — ABNORMAL HIGH (ref 70–99)
Potassium: 4 mmol/L (ref 3.5–5.1)
Sodium: 138 mmol/L (ref 135–145)
Total Bilirubin: 0.6 mg/dL (ref 0.3–1.2)
Total Protein: 8.1 g/dL (ref 6.5–8.1)

## 2019-09-22 LAB — URINALYSIS, COMPLETE (UACMP) WITH MICROSCOPIC
Bilirubin Urine: NEGATIVE
Glucose, UA: NEGATIVE mg/dL
Hgb urine dipstick: NEGATIVE
Ketones, ur: NEGATIVE mg/dL
Leukocytes,Ua: NEGATIVE
Nitrite: NEGATIVE
Protein, ur: NEGATIVE mg/dL
Specific Gravity, Urine: 1.009 (ref 1.005–1.030)
pH: 6 (ref 5.0–8.0)

## 2019-09-22 LAB — CBC
HCT: 43.7 % (ref 36.0–46.0)
Hemoglobin: 14.5 g/dL (ref 12.0–15.0)
MCH: 29 pg (ref 26.0–34.0)
MCHC: 33.2 g/dL (ref 30.0–36.0)
MCV: 87.4 fL (ref 80.0–100.0)
Platelets: 249 10*3/uL (ref 150–400)
RBC: 5 MIL/uL (ref 3.87–5.11)
RDW: 13.8 % (ref 11.5–15.5)
WBC: 8.6 10*3/uL (ref 4.0–10.5)
nRBC: 0 % (ref 0.0–0.2)

## 2019-09-22 LAB — LIPASE, BLOOD: Lipase: 22 U/L (ref 11–51)

## 2019-09-22 LAB — GLUCOSE, CAPILLARY: Glucose-Capillary: 127 mg/dL — ABNORMAL HIGH (ref 70–99)

## 2019-09-22 NOTE — ED Triage Notes (Signed)
Patient started Ozempic 2 days ago. Patient c/o N/V, abdominal pain, and hypoglycemia (CBG 40) at home.

## 2019-09-22 NOTE — ED Notes (Signed)
Patient reports started a new medicine on Thursday and has been "sick" ever since.  Patient reports nausea, weak feeling and feeling that blood sugar is dropping.

## 2019-09-23 ENCOUNTER — Emergency Department: Payer: Medicare Other

## 2019-09-23 ENCOUNTER — Emergency Department
Admission: EM | Admit: 2019-09-23 | Discharge: 2019-09-23 | Disposition: A | Discharge status: Home / Self Care | Payer: Medicare Other | Attending: Emergency Medicine | Admitting: Emergency Medicine

## 2019-09-23 ENCOUNTER — Encounter: Payer: Self-pay | Admitting: Radiology

## 2019-09-23 DIAGNOSIS — T50905A Adverse effect of unspecified drugs, medicaments and biological substances, initial encounter: Secondary | ICD-10-CM

## 2019-09-23 DIAGNOSIS — R1084 Generalized abdominal pain: Secondary | ICD-10-CM

## 2019-09-23 DIAGNOSIS — R112 Nausea with vomiting, unspecified: Secondary | ICD-10-CM

## 2019-09-23 MED ORDER — LACTATED RINGERS IV BOLUS
1000.0000 mL | Freq: Once | INTRAVENOUS | Status: AC
  Administered 2019-09-23: 1000 mL via INTRAVENOUS

## 2019-09-23 MED ORDER — ONDANSETRON HCL 4 MG/2ML IJ SOLN
4.0000 mg | Freq: Once | INTRAMUSCULAR | Status: AC
  Administered 2019-09-23: 4 mg via INTRAVENOUS
  Filled 2019-09-23: qty 2

## 2019-09-23 MED ORDER — ONDANSETRON 4 MG PO TBDP: 4.0000 mg | ORAL_TABLET | Freq: Three times a day (TID) | ORAL | 0 refills | Status: DC | PRN

## 2019-09-23 MED ORDER — MORPHINE SULFATE (PF) 4 MG/ML IV SOLN
4.0000 mg | Freq: Once | INTRAVENOUS | Status: AC
  Administered 2019-09-23: 4 mg via INTRAVENOUS
  Filled 2019-09-23: qty 1

## 2019-09-23 MED ORDER — IOHEXOL 300 MG/ML  SOLN
125.0000 mL | Freq: Once | INTRAMUSCULAR | Status: AC | PRN
  Administered 2019-09-23: 150 mL via INTRAVENOUS

## 2019-09-23 NOTE — ED Notes (Signed)
Pt taken to CT.

## 2019-09-23 NOTE — ED Provider Notes (Signed)
[facility] Emergency Department Provider Note   ____________________________________________   First MD Initiated Contact with Patient 09/23/19 0202     (approximate)  I have reviewed the triage vital signs and the nursing notes.   HISTORY  Chief Complaint Medication Reaction    HPI Christine Mcneil is a 54 y.o. female with possible history of hypertension, hyperlipidemia, diabetes, and chronic pain who presents to the ED complaining of abdominal pain.  Patient reports that she developed diffuse abdominal pain 3 days ago, 1 day after starting Ozempic for her diabetes.  She describes the pain as crampy and constant, not exacerbated or alleviated by anything.  It has been associated with nausea as well as multiple episodes of vomiting, but she denies any constipation or diarrhea.  She was counseled to continue taking her Metformin and Tradjenta after taking the initial dose of once weekly Ozempic.  She noted she was hypoglycemic the evening after starting the medication, but has not had issues with hypoglycemia since then.  She denies any fevers, cough, chest pain, shortness of breath, dysuria, or hematuria.        Past Medical History:  Diagnosis Date  . Abdominal pain   . Anxiety and depression   . Atypical facial pain   . Bilateral occipital neuralgia   . Cervical spondylosis without myelopathy   . Chronic daily headache   . Chronic pain   . Chronic prostatitis   . Chronic thoracic back pain   . Depression   . Diabetes mellitus without complication ([place])   . Diabetes mellitus, type II ([place])   . Dysphagia   . Dysrhythmia   . Fibromyalgia   . Hyperlipidemia   . Hypertension   . Insomnia   . Iron deficiency anemia   . Left hip pain   . Low back pain   . Migraines   . Numbness   . Optic neuropathy   . OSA (obstructive sleep apnea)   . Polyarthralgia   . Primary osteoarthritis of both hips   . Prothrombin gene mutation ([place])   . Pseudotumor  cerebri   . Rectal bleeding   . Right shoulder pain   . Rotator cuff syndrome   . Stroke ([place])   . Thyroid disease   . TIA (transient ischemic attack) 05/27/2017    Patient Active Problem List   Diagnosis Date Noted  . MDD (major depressive disorder), recurrent, in full remission ([place]) 09/10/2019  . Peripheral edema 06/28/2019  . Paroxysmal atrial fibrillation ([place]) 06/28/2019  . Episodic tension-type headache, not intractable 06/13/2019  . Atypical angina ([place]) 06/06/2019  . Lumbar radiculopathy 02/21/2019  . MDD (major depressive disorder), recurrent episode, moderate ([place]) 11/28/2018  . PTSD (post-traumatic stress disorder) 11/28/2018  . GAD (generalized anxiety disorder) 11/28/2018  . Insomnia due to mental disorder 11/28/2018  . Severe obesity (BMI >= 40) ([place]) 11/27/2018  . Polyp of sigmoid colon   . Iron deficiency anemia 01/19/2018  . 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 TIA (transient ischemic attack) 06/20/2017  . Primary osteoarthritis of both hips 06/20/2017  . Periodic limb movements of sleep 05/30/2017  . Shortness of breath 01/03/2017  . 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 Chronic prostatitis/chronic pelvic pain syndrome 05/28/2016  . Migraine without aura and without status migrainosus, not intractable 05/11/2016  . Chronic anticoagulation 03/29/2016  . Encounter for monitoring opioid maintenance therapy 11/04/2015  . Dysphagia, neurologic 09/16/2015  . Numbness 09/16/2015  . Anti-cardiolipin antibody positive  09/01/2015  . 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 H/O: CVA (cerebrovascular accident) 06/27/2015  . Anemia, iron deficiency 10/02/2014  . Chronic thoracic back pain 07/24/2014  . Bilateral occipital neuralgia 04/11/2014  . Hypothyroidism, unspecified 10/17/2013  . Type 2 diabetes, HbA1c goal < 7% (HCC) 10/17/2013  . Cervicogenic headache 07/04/2013  . Cervical spondylosis without myelopathy 11/16/2012   . Rotator cuff syndrome 11/13/2012  . Sinus congestion 11/07/2012  . Abdominal pain 09/28/2012  . Rectal bleeding 09/28/2012  . Depression 02/22/2012  . GERD (gastroesophageal reflux disease) 02/22/2012  . Essential hypertension 02/22/2012  . Obesity, unspecified 02/22/2012  . Neuromuscular disorder ([place]) 02/22/2012  . Pain in joint, pelvic region and thigh 01/17/2012  . Insomnia secondary to chronic pain 10/04/2011  . Right shoulder pain 10/04/2011  . Atypical facial pain 10/03/2011  . Chronic daily headache 10/03/2011  . Fibromyalgia 10/03/2011  . Hyperlipidemia, unspecified 10/03/2011    Past Surgical History:  Procedure Laterality Date  . ABDOMINAL HYSTERECTOMY     total  . CHOLECYSTECTOMY    . COLONOSCOPY WITH PROPOFOL N/A 03/21/2018   Procedure: COLONOSCOPY WITH PROPOFOL;  Surgeon: Lucilla Lame, MD;  Location: [facility] ENDOSCOPY;  Service: Endoscopy;  Laterality: N/A;  . ESOPHAGOGASTRODUODENOSCOPY (EGD) WITH PROPOFOL N/A 03/21/2018   Procedure: ESOPHAGOGASTRODUODENOSCOPY (EGD) WITH PROPOFOL;  Surgeon: Lucilla Lame, MD;  Location: [facility] ENDOSCOPY;  Service: Endoscopy;  Laterality: N/A;  . JOINT REPLACEMENT    . KNEE SURGERY Left   . ROTATOR CUFF REPAIR Right   . spinal neurostimulator      Prior to Admission medications   Medication Sig Start Date End Date Taking? Authorizing Provider  ACCU-CHEK AVIVA PLUS test strip  03/27/18   [provider]  ACCU-CHEK SOFTCLIX LANCETS lancets  11/07/17   [provider]  acetaZOLAMIDE (DIAMOX) 125 MG tablet Take 125 mg by mouth daily.  11/25/17   [provider]  AIMOVIG 70 MG/ML SOAJ Inject 70 mg into the skin every 28 (twenty-eight) days. 06/15/17   [provider]  albuterol (VENTOLIN HFA) 108 (90 Base) MCG/ACT inhaler Inhale 2 puffs into the lungs every 6 (six) hours as needed for wheezing or shortness of breath. 03/08/19   Gregor Hams, MD  ARIPiprazole (ABILIFY) 5 MG tablet Take 1 tablet (5 mg  total) by mouth daily. 07/10/19   Ursula Alert, MD  aspirin-acetaminophen-caffeine (EXCEDRIN MIGRAINE) [phone] MG tablet Take 2 tablets by mouth every 8 (eight) hours as needed for headache or migraine.     [provider]  atorvastatin (LIPITOR) 80 MG tablet Take 80 mg by mouth daily at 6 PM. 12/22/17   [provider]  baclofen (LIORESAL) 10 MG tablet TAKE 1 TABLET BY MOUTH ONCE DAILY AS NEEDED FOR UP TO 30 DAYS. MAY TAKE 2 TABLETS IF NEEDED 04/17/19   [provider]  Blood Glucose Monitoring Suppl (ACCU-CHEK AVIVA PLUS) w/Device KIT  11/07/17   [provider]  Carboxymethylcellulose Sod PF (REFRESH PLUS) 0.5 % SOLN Apply to eye. 05/15/15   [provider]  Cholecalciferol (VITAMIN D3) 1000 units CAPS Take 1 capsule by mouth daily.    [provider]  clotrimazole (LOTRIMIN) 1 % cream  12/05/17   [provider]  cyclobenzaprine (FLEXERIL) 5 MG tablet Take 5 mg by mouth 3 (three) times daily as needed.  12/08/17   [provider]  dabigatran (PRADAXA) 150 MG CAPS capsule Take 150 mg by mouth 2 (two) times daily. 10/12/16 08/15/19  [provider]  diltiazem [place]  CD) 240 MG 24 hr capsule Take by mouth. Take 1 capsule by mouth once daily 12/26/17 08/15/19  [provider]  DULoxetine (CYMBALTA) 30 MG capsule Take 1 capsule (30 mg total) by mouth daily. To be combined with 60 mg 09/10/19   Ursula Alert, MD  DULoxetine (CYMBALTA) 60 MG capsule Take 1 capsule (60 mg total) by mouth daily. To be combined with 30 mg 09/10/19   Ursula Alert, MD  esomeprazole (NEXIUM) 40 MG capsule Take 40 mg by mouth daily.    [provider]  ferrous sulfate 325 (65 FE) MG tablet Take by mouth. Take 325 mg daily with breakfast    [provider]  fluticasone (FLONASE) 50 MCG/ACT nasal spray Place 2 sprays into both nostrils as needed.  06/25/17   [provider]  furosemide (LASIX) 20 MG tablet Take by  mouth. 06/26/19 06/25/20  [provider]  hydrOXYzine (VISTARIL) 25 MG capsule TAKE (1) CAPSULE BY MOUTH TWICE DAILY ASNEEDED FOR SEVERA ANXIETY ATTACKS 09/10/19   Ursula Alert, MD  Insulin Degludec (TRESIBA FLEXTOUCH) 200 UNIT/ML SOPN Inject into the skin. 11/22/18   [provider]  lamoTRIgine (LAMICTAL) 100 MG tablet Take 1 tablet (100 mg total) by mouth daily. To be combined with 25 mg 09/10/19   Ursula Alert, MD  lamoTRIgine (LAMICTAL) 25 MG tablet TAKE ONE TABLET BY MOUTH DAILY. TO BE COMBINED WITH 100 MG. 09/10/19   Eappen, Ria Clock, MD  levothyroxine (SYNTHROID, LEVOTHROID) 100 MCG tablet Take 100 mcg by mouth daily. 08/18/15   [provider]  loratadine (CLARITIN) 10 MG tablet Take 10 mg by mouth daily as needed for allergies.    [provider]  Magnesium (V-R MAGNESIUM) 250 MG TABS Take by mouth.    [provider]  Magnesium 250 MG TABS Take 1 tablet by mouth daily.     [provider]  metFORMIN (GLUCOPHAGE-XR) 500 MG 24 hr tablet Take 500 mg by mouth 2 (two) times daily.  02/17/18   [provider]  naloxone [facility]) nasal spray 4 mg/0.1 mL 1 spray into nose for opioid overdose. If needed, may repeat spray in 2-3 min 03/14/18   [provider]  NUCYNTA ER 100 MG 12 hr tablet Take 1 tablet by mouth 2 (two) times daily. 05/30/17   [provider]  omeprazole (PRILOSEC) 20 MG capsule TAKE (1) CAPSULE BY MOUTH EVERY DAY 08/30/19   Virgel Manifold, MD  ondansetron (ZOFRAN ODT) 4 MG disintegrating tablet Take 1 tablet (4 mg total) by mouth every 8 (eight) hours as needed for nausea or vomiting. 09/23/19   Blake Divine, MD  ondansetron (ZOFRAN) 4 MG tablet TAKE (1) TABLET BY MOUTH EVERY 8 HOURS AS NEEDED FOR NAUSEA AND VOMITING 07/20/18   [provider]  pioglitazone (ACTOS) 15 MG tablet Take by mouth. Take 1 tablet by mouth once daily 11/07/17 08/15/19  [provider]  quinapril (ACCUPRIL) 10 MG  tablet Take 10 mg by mouth daily. 05/01/13   [provider]  Rimegepant Sulfate (NURTEC) 75 MG TBDP Take 1 tablet by mouth as needed.  01/12/19   [provider]  traZODone (DESYREL) 100 MG tablet Take 1.5-2 tablets (150-200 mg total) by mouth at bedtime as needed for sleep. 09/10/19   Ursula Alert, MD  TRESIBA FLEXTOUCH 200 UNIT/ML SOPN Inject 32 Units into the skin at bedtime.  11/22/18   [provider]    Allergies Amoxicillin, Amoxapine, Dapagliflozin, Keflex [cephalexin], and Mirtazapine  Family History  Problem Relation Age of Onset  . Diabetes Mother   . Hyperlipidemia Mother   . Hypertension Mother   . COPD Mother   . CVA Mother   . Anemia Mother   . Diabetes Father   . Hyperlipidemia Father   . Hypertension Father   . Thyroid disease Father   . Alcohol abuse Father   . Peripheral vascular disease Sister   . Hypertension Sister   . Anxiety disorder Son   . Depression Son   . Bipolar disorder Son   . Seizures Son     Social History Social History   Tobacco Use  . Smoking status: Never Smoker  . Smokeless tobacco: Never Used  Substance Use Topics  . Alcohol use: No  . Drug use: No    Review of Systems  Constitutional: No fever/chills Eyes: No visual changes. ENT: No sore throat. Cardiovascular: Denies chest pain. Respiratory: Denies shortness of breath. Gastrointestinal: Positive for abdominal pain, nausea, and vomiting.  No diarrhea.  No constipation. Genitourinary: Negative for dysuria. Musculoskeletal: Negative for back pain. Skin: Negative for rash. Neurological: Negative for headaches, focal weakness or numbness.  ____________________________________________   PHYSICAL EXAM:  VITAL SIGNS: ED Triage Vitals  Enc Vitals Group     BP 09/22/19 2248 122/87     Pulse Rate 09/22/19 2248 92     Resp 09/22/19 2248 18     Temp 09/22/19 2248 97.9 F (36.6 C)     Temp src --      SpO2 09/22/19 2248 96 %     Weight 09/22/19  2249 250 lb (113.4 kg)     Height 09/22/19 2249 5' 6" (1.676 m)     Head Circumference --      Peak Flow --      Pain Score --      Pain Loc --      Pain Edu? --      Excl. in [place]? --     Constitutional: Alert and oriented. Eyes: Conjunctivae are normal. Head: Atraumatic. Nose: No congestion/rhinnorhea. Mouth/Throat: Mucous membranes are moist. Neck: Normal ROM Cardiovascular: Normal rate, regular rhythm. Grossly normal heart sounds. Respiratory: Normal respiratory effort.  No retractions. Lungs CTAB. Gastrointestinal: Soft and diffusely tender to palpation with no rebound or guarding. No distention. Genitourinary: deferred Musculoskeletal: No lower extremity tenderness nor edema. Neurologic:  Normal speech and language. No gross focal neurologic deficits are appreciated. Skin:  Skin is warm, dry and intact. No rash noted. Psychiatric: Mood and affect are normal. Speech and behavior are normal.  ____________________________________________   LABS (all labs ordered are listed, but only abnormal results are displayed)  Labs Reviewed  GLUCOSE, CAPILLARY - Abnormal; Notable for the following components:      Result Value   Glucose-Capillary 127 (*)    All other components within normal limits  COMPREHENSIVE METABOLIC PANEL - Abnormal; Notable for the following components:   Glucose, Bld 141 (*)    Creatinine, Ser 1.23 (*)    AST 69 (*)    ALT 73 (*)    Alkaline Phosphatase 168 (*)    GFR calc non Af Amer 50 (*)    GFR calc Af Amer 58 (*)    All other components within normal limits  URINALYSIS, COMPLETE (UACMP) WITH MICROSCOPIC - Abnormal; Notable for the following components:   Color, Urine YELLOW (*)    APPearance CLEAR (*)    Bacteria, UA RARE (*)    All other components within normal limits  LIPASE,  BLOOD  CBC    PROCEDURES  Procedure(s) performed (including Critical Care):  Procedures   ____________________________________________   INITIAL IMPRESSION /  ASSESSMENT AND PLAN / ED COURSE       54 year old female with history of diabetes presents to the ED complaining of 3 days of diffuse abdominal pain as well as nausea and vomiting, which came on 1 day after starting Ozempic.  Lab work is concerning for mild AKI as well as mild transaminitis.  Will hydrate with IV fluids, treat pain with IV morphine and also give IV Zofran.  It is possible that her symptoms are a side effect of Ozempic as it is known to cause abdominal pain as well as vomiting.  It is also known to cause pancreatitis, however her lipase is within normal limits.  We will further assess with CT scan for alternative pathology, but if this is negative symptoms may be attributed to the new medication.  UA shows no evidence of UTI.  CT scan negative for acute process, patient reports feeling better following morphine and Zofran.  At this point, I suspect her symptoms are an adverse effect related to her Ozempic and I have advised her to stop this medication until she can further discuss with her PCP.  She was counseled to otherwise continue her other diabetic medications and to return to the ED for new worsening symptoms.  Patient agrees with plan.      ____________________________________________   FINAL CLINICAL IMPRESSION(S) / ED DIAGNOSES  Final diagnoses:  Generalized abdominal pain  Non-intractable vomiting with nausea, unspecified vomiting type  Adverse effect of drug, initial encounter     ED Discharge Orders         Ordered    ondansetron (ZOFRAN ODT) 4 MG disintegrating tablet  Every 8 hours PRN     09/23/19 0458           Note:  This document was prepared using Dragon voice recognition software and may include unintentional dictation errors.   Blake Divine, MD 09/23/19 [phone]

## 2019-09-23 NOTE — ED Notes (Addendum)
Pt st that she was told to take her already previously prescribed diabetes medications (metformin and trajenda) and the newly prescribed Ozempic this past Thursday when the bulk of her sx started. (Pt st she was told that the Ozempic was meant to be taken once a week)

## 2019-09-24 ENCOUNTER — Ambulatory Visit: Payer: Medicare Other | Admitting: Anesthesiology

## 2019-09-24 ENCOUNTER — Ambulatory Visit: Payer: Medicare Other | Admitting: Gastroenterology

## 2019-09-24 ENCOUNTER — Encounter: Payer: Self-pay | Admitting: Gastroenterology

## 2019-09-24 ENCOUNTER — Encounter
Admission: RE | Disposition: A | Discharge status: Home / Self Care | Payer: Self-pay | Source: Home / Self Care | Attending: Gastroenterology

## 2019-09-24 ENCOUNTER — Other Ambulatory Visit: Payer: Self-pay

## 2019-09-24 ENCOUNTER — Ambulatory Visit
Admission: RE | Admit: 2019-09-24 | Discharge: 2019-09-24 | Disposition: A | Discharge status: Home / Self Care | Payer: Medicare Other | Attending: Gastroenterology | Admitting: Gastroenterology

## 2019-09-24 DIAGNOSIS — D509 Iron deficiency anemia, unspecified: Secondary | ICD-10-CM | POA: Insufficient documentation

## 2019-09-24 HISTORY — PX: GIVENS CAPSULE STUDY: SHX5432

## 2019-09-24 SURGERY — IMAGING PROCEDURE, GI TRACT, INTRALUMINAL, VIA CAPSULE

## 2019-09-24 NOTE — Anesthesia Preprocedure Evaluation (Deleted)
Anesthesia Evaluation  Patient identified by MRN, date of birth, ID band Patient awake    Reviewed: Allergy & Precautions, H&P , NPO status , reviewed documented beta blocker date and time   Airway Mallampati: III  TM Distance: <3 FB Neck ROM: limited    Dental  (+) Chipped   Pulmonary shortness of breath, sleep apnea ,    Pulmonary exam normal        Cardiovascular hypertension, + angina Normal cardiovascular exam+ dysrhythmias   2019 ECHO Study Conclusions   - Technically difficult study due to chest wall and/or lung  interference.  - Left ventricle: The cavity size was normal. Wall thickness was  increased in a pattern of moderate LVH. Systolic function was  normal. The estimated ejection fraction was in the range of 60%  to 65%. Wall motion was normal; there were no regional wall  motion abnormalities. The study is not technically sufficient to  allow evaluation of LV diastolic function.  - Right ventricle: The cavity size was normal. Systolic function  was normal.  - Limited evaluation of the valvular structures due to suboptimal  windows.    Neuro/Psych  Headaches, PSYCHIATRIC DISORDERS Anxiety Depression TIA Neuromuscular disease CVA    GI/Hepatic GERD  ,  Endo/Other  diabetesHypothyroidism Morbid obesity  Renal/GU      Musculoskeletal  (+) Arthritis , Fibromyalgia -  Abdominal   Peds  Hematology  (+) Blood dyscrasia, anemia ,   Anesthesia Other Findings Past Medical History: No date: Abdominal pain No date: Anxiety and depression No date: Atypical facial pain No date: Bilateral occipital neuralgia No date: Cervical spondylosis without myelopathy No date: Chronic daily headache No date: Chronic pain No date: Chronic prostatitis No date: Chronic thoracic back pain No date: Depression No date: Diabetes mellitus without complication (HCC) No date: Diabetes mellitus, type II (HCC) No  date: Dysphagia No date: Dysrhythmia No date: Fibromyalgia No date: Hyperlipidemia No date: Hypertension No date: Insomnia No date: Iron deficiency anemia No date: Left hip pain No date: Low back pain No date: Migraines No date: Numbness No date: Optic neuropathy No date: OSA (obstructive sleep apnea) No date: Polyarthralgia No date: Primary osteoarthritis of both hips No date: Prothrombin gene mutation (HCC) No date: Pseudotumor cerebri No date: Rectal bleeding No date: Right shoulder pain No date: Rotator cuff syndrome No date: Stroke [facility]) No date: Thyroid disease 05/27/2017: TIA (transient ischemic attack)  Past Surgical History: No date: ABDOMINAL HYSTERECTOMY     Comment:  total No date: CHOLECYSTECTOMY 03/21/2018: COLONOSCOPY WITH PROPOFOL; N/A     Comment:  Procedure: COLONOSCOPY WITH PROPOFOL;  Surgeon: Lucilla Lame, MD;  Location: [facility] ENDOSCOPY;  Service:               Endoscopy;  Laterality: N/A; 03/21/2018: ESOPHAGOGASTRODUODENOSCOPY (EGD) WITH PROPOFOL; N/A     Comment:  Procedure: ESOPHAGOGASTRODUODENOSCOPY (EGD) WITH               PROPOFOL;  Surgeon: Lucilla Lame, MD;  Location: [facility]               ENDOSCOPY;  Service: Endoscopy;  Laterality: N/A; No date: JOINT REPLACEMENT No date: KNEE SURGERY; Left No date: ROTATOR CUFF REPAIR; Right No date: spinal neurostimulator     Reproductive/Obstetrics  Anesthesia Physical Anesthesia Plan  ASA: III  Anesthesia Plan: General   Post-op Pain Management:    Induction: Intravenous  PONV Risk Score and Plan: 3 and Treatment may vary due to age or medical condition and TIVA  Airway Management Planned: Nasal Cannula and Natural Airway  Additional Equipment:   Intra-op Plan:   Post-operative Plan:   Informed Consent: I have reviewed the patients History and Physical, chart, labs and discussed the procedure including the risks, benefits  and alternatives for the proposed anesthesia with the patient or authorized representative who has indicated his/her understanding and acceptance.     Dental Advisory Given  Plan Discussed with: CRNA  Anesthesia Plan Comments:         Anesthesia Quick Evaluation

## 2019-09-25 ENCOUNTER — Ambulatory Visit (INDEPENDENT_AMBULATORY_CARE_PROVIDER_SITE_OTHER): Payer: Medicare Other | Admitting: Pulmonary Disease

## 2019-09-25 ENCOUNTER — Other Ambulatory Visit
Admission: RE | Admit: 2019-09-25 | Discharge: 2019-09-25 | Disposition: A | Discharge status: Home / Self Care | Payer: Medicare Other | Source: Ambulatory Visit | Attending: Pulmonary Disease | Admitting: Pulmonary Disease

## 2019-09-25 ENCOUNTER — Encounter: Payer: Self-pay | Admitting: *Deleted

## 2019-09-25 VITALS — BP 106/52 | HR 103 | Temp 97.1°F | Ht 66.0 in | Wt 244.6 lb

## 2019-09-25 DIAGNOSIS — R0601 Orthopnea: Secondary | ICD-10-CM

## 2019-09-25 DIAGNOSIS — R042 Hemoptysis: Secondary | ICD-10-CM | POA: Diagnosis present

## 2019-09-25 DIAGNOSIS — Z79899 Other long term (current) drug therapy: Secondary | ICD-10-CM

## 2019-09-25 LAB — CREATININE, SERUM
Creatinine, Ser: 1.02 mg/dL — ABNORMAL HIGH (ref 0.44–1.00)
GFR calc Af Amer: >60 mL/min (ref >60)
GFR calc non Af Amer: >60 mL/min (ref >60)

## 2019-09-25 LAB — BUN: BUN: 10 mg/dL (ref 6–20)

## 2019-09-25 NOTE — Progress Notes (Signed)
Subjective:    Patient ID: Christine Mcneil, female    DOB: 07-21-1965, 54 y.o.   MRN: [id number]  HPI The patient is a 54 year old lifelong never smoker who presents for evaluation of potential hemoptysis.  The patient is not a very reliable historian but it appears that she has been having hemoptysis in the mornings for approximately 6 months.  She states that over the last 2 weeks this has stopped.  She is referred by Dr. [name].  Her primary care physician is Dr. Jenny Reichmann Dr. Rene Paci.  She apparently has had extensive GI and ENT evaluation, I am not certain why she presents now after 6 months of onset of symptoms.  Note, she is on Pradaxa paroxysmal atrial fibrillation.  That the amount of blood is approximately 2 teaspoons.  Not mixed with any purulent material.  It comes and spontaneously without coughing.  When asked if she coughs up blood she states that she does not cough it up but that she "spits it" and it just "comes up".  She notes that this has occurred every morning for approximately 6 months but that none of this has occurred over the last 2 weeks.  Occasionally she will have a hard cough in the morning attributed to postnasal drip.  This cough does not produce bleeding, however the bleeding may occur afterwards.  She believes her cough in the mornings associated with postnasal drip.  She feels her throat is irritated at all times.  She has been evaluated by ENT that did not show any significant ENT source.  She has just undergone a capsule study, she had EGD and colonoscopy in 2019 but none recently.  A chest x-ray was performed on 09 August 2019 which showed no pulmonary lesions or abnormalities.  Esophagram and barium swallow study was performed on 29 March and this showed gastroesophageal reflux to the level of mid esophagus but no other abnormalities other than tertiary contractions of the esophagus.  Darted omeprazole after that.  The patient has not noticed anything that aggravates  or improves on this issues of hemoptysis that now have not occurred for the last 2 weeks.  She has used an albuterol inhaler prescribed previously without any effect.  Patient also reports orthopnea for "years".  She has to sleep propped up.  He does not describe dyspnea during active hours.  She has had no fevers, chills or sweats.  No purulent sputum production.  No chest pain.  No paroxysmal nocturnal dyspnea.  No lower extremity edema or calf tenderness.  Does have issues with significant postnasal drip which is "constant".  Her cardiologist is Dr. Clayborn Bigness.  She has been worked up with regards to her orthopnea.  2D echo in January normal.  Review of Systems A 10 point review of systems was performed and it is as noted above otherwise negative.  Past Medical History:  Diagnosis Date  . Abdominal pain   . Anxiety and depression   . Atypical facial pain   . Bilateral occipital neuralgia   . Cervical spondylosis without myelopathy   . Chronic daily headache   . Chronic pain   . Chronic pelvic pain in female   . Chronic thoracic back pain   . Depression   . Diabetes mellitus without complication ([place])   . Diabetes mellitus, type II ([place])   . Dysphagia   . Dysrhythmia   . Fibromyalgia   . Hyperlipidemia   . Hypertension   . Insomnia   . Iron deficiency  anemia   . Left hip pain   . Low back pain   . Migraines   . Numbness   . Optic neuropathy   . OSA (obstructive sleep apnea)   . Polyarthralgia   . Primary osteoarthritis of both hips   . Prothrombin gene mutation ([place])   . Pseudotumor cerebri   . Rectal bleeding   . Right shoulder pain   . Rotator cuff syndrome   . Stroke ([place])   . Thyroid disease   . TIA (transient ischemic attack) 05/27/2017    Past Surgical History:  Procedure Laterality Date  . ABDOMINAL HYSTERECTOMY     total  . CHOLECYSTECTOMY    . COLONOSCOPY WITH PROPOFOL N/A 03/21/2018   Procedure: COLONOSCOPY WITH PROPOFOL;  Surgeon: Lucilla Lame, MD;   [facility] ENDOSCOPY;  Service: Endoscopy;  Laterality: N/A;  . ESOPHAGOGASTRODUODENOSCOPY (EGD) WITH PROPOFOL N/A 03/21/2018   Procedure: ESOPHAGOGASTRODUODENOSCOPY (EGD) WITH PROPOFOL;  Surgeon: Lucilla Lame, MD;  Location: [facility] ENDOSCOPY;  Service: Endoscopy;  Laterality: N/A;  . GIVENS CAPSULE STUDY N/A 09/24/2019   Procedure: GIVENS CAPSULE STUDY;  Surgeon: Virgel Manifold, MD;  Location: [facility] ENDOSCOPY;  Service: Endoscopy;  Laterality: N/A;  . JOINT REPLACEMENT    . KNEE SURGERY Left   . ROTATOR CUFF REPAIR Right   . spinal neurostimulator     Family History  Problem Relation Age of Onset  . Diabetes Mother   . Hyperlipidemia Mother   . Hypertension Mother   . COPD Mother   . CVA Mother   . Anemia Mother   . Diabetes Father   . Hyperlipidemia Father   . Hypertension Father   . Thyroid disease Father   . Alcohol abuse Father   . Peripheral vascular disease Sister   . Hypertension Sister   . Anxiety disorder Son   . Depression Son   . Bipolar disorder Son   . Seizures Son     Social history: Lifelong never smoker.  No exotic pets in the home, enjoys fishing, no unusual hobbies.  No military history.  No travel outside of the country.  Has resided in [place] all her life.  No prior history of tuberculosis.  No exposures to tuberculosis.   Allergies  Allergen Reactions  . Amoxapine Itching  . Amoxicillin Itching  . Dapagliflozin Itching    Other reaction(s): Other (See Comments) Thrush & vaginal itching  . Keflex [Cephalexin] Itching  . Mirtazapine Other (See Comments)    Pt states felling "like my body is outside of me."   Current Meds  Medication Sig  . ACCU-CHEK AVIVA PLUS test strip   . ACCU-CHEK SOFTCLIX LANCETS lancets   . acetaZOLAMIDE (DIAMOX) 125 MG tablet Take 125 mg by mouth daily.   Marland Kitchen AIMOVIG 70 MG/ML SOAJ Inject 70 mg into the skin every 28 (twenty-eight) days.  Marland Kitchen albuterol (VENTOLIN HFA) 108 (90 Base) MCG/ACT inhaler Inhale 2 puffs  into the lungs every 6 (six) hours as needed for wheezing or shortness of breath.  . ARIPiprazole (ABILIFY) 5 MG tablet Take 1 tablet (5 mg total) by mouth daily.  Marland Kitchen aspirin-acetaminophen-caffeine (EXCEDRIN MIGRAINE) 250-250-65 MG tablet Take 2 tablets by mouth every 8 (eight) hours as needed for headache or migraine.   Marland Kitchen atorvastatin (LIPITOR) 80 MG tablet Take 80 mg by mouth daily at 6 PM.  . baclofen (LIORESAL) 10 MG tablet TAKE 1 TABLET BY MOUTH ONCE DAILY AS NEEDED FOR UP TO 30 DAYS. MAY TAKE 2 TABLETS IF NEEDED  .  Blood Glucose Monitoring Suppl (ACCU-CHEK AVIVA PLUS) w/Device KIT   . Carboxymethylcellulose Sod PF (REFRESH PLUS) 0.5 % SOLN Apply to eye.  . Cholecalciferol (VITAMIN D3) 1000 units CAPS Take 1 capsule by mouth daily.  . clotrimazole (LOTRIMIN) 1 % cream   . cyclobenzaprine (FLEXERIL) 5 MG tablet Take 5 mg by mouth 3 (three) times daily as needed.   . DULoxetine (CYMBALTA) 30 MG capsule Take 1 capsule (30 mg total) by mouth daily. To be combined with 60 mg  . DULoxetine (CYMBALTA) 60 MG capsule Take 1 capsule (60 mg total) by mouth daily. To be combined with 30 mg  . esomeprazole (NEXIUM) 40 MG capsule Take 40 mg by mouth daily.  . ferrous sulfate 325 (65 FE) MG tablet Take by mouth. Take 325 mg daily with breakfast  . fluticasone (FLONASE) 50 MCG/ACT nasal spray Place 2 sprays into both nostrils as needed.   . furosemide (LASIX) 20 MG tablet Take by mouth.  . hydrOXYzine (VISTARIL) 25 MG capsule TAKE (1) CAPSULE BY MOUTH TWICE DAILY ASNEEDED FOR SEVERA ANXIETY ATTACKS  . Insulin Degludec (TRESIBA FLEXTOUCH) 200 UNIT/ML SOPN Inject into the skin.  Marland Kitchen lamoTRIgine (LAMICTAL) 100 MG tablet Take 1 tablet (100 mg total) by mouth daily. To be combined with 25 mg  . lamoTRIgine (LAMICTAL) 25 MG tablet TAKE ONE TABLET BY MOUTH DAILY. TO BE COMBINED WITH 100 MG.  . levothyroxine (SYNTHROID, LEVOTHROID) 100 MCG tablet Take 100 mcg by mouth daily.  Marland Kitchen loratadine (CLARITIN) 10 MG tablet Take  10 mg by mouth daily as needed for allergies.  . Magnesium (V-R MAGNESIUM) 250 MG TABS Take by mouth.  . Magnesium 250 MG TABS Take 1 tablet by mouth daily.   . metFORMIN (GLUCOPHAGE-XR) 500 MG 24 hr tablet Take 500 mg by mouth 2 (two) times daily.   . naloxone (NARCAN) nasal spray 4 mg/0.1 mL 1 spray into nose for opioid overdose. If needed, may repeat spray in 2-3 min  . NUCYNTA ER 100 MG 12 hr tablet Take 1 tablet by mouth 2 (two) times daily.  Marland Kitchen omeprazole (PRILOSEC) 20 MG capsule TAKE (1) CAPSULE BY MOUTH EVERY DAY  . ondansetron (ZOFRAN ODT) 4 MG disintegrating tablet Take 1 tablet (4 mg total) by mouth every 8 (eight) hours as needed for nausea or vomiting.  . ondansetron (ZOFRAN) 4 MG tablet TAKE (1) TABLET BY MOUTH EVERY 8 HOURS AS NEEDED FOR NAUSEA AND VOMITING  . quinapril (ACCUPRIL) 10 MG tablet Take 10 mg by mouth daily.  . Rimegepant Sulfate (NURTEC) 75 MG TBDP Take 1 tablet by mouth as needed.   . traZODone (DESYREL) 100 MG tablet Take 1.5-2 tablets (150-200 mg total) by mouth at bedtime as needed for sleep.  . TRESIBA FLEXTOUCH 200 UNIT/ML SOPN Inject 32 Units into the skin at bedtime.    Significant polypharmacy with duplication of medications (PPI).   Objective:   Physical Exam BP (!) 106/52 (BP Location: Left Arm, Cuff Size: Large)   Pulse (!) 103   Temp (!) 97.1 F (36.2 C) (Temporal)   Ht 5' 6"  (1.676 m)   Wt 244 lb 9.6 oz (110.9 kg)   SpO2 95%   BMI 39.48 kg/m  GENERAL: Obese woman, no acute distress, no conversational dyspnea, fully ambulatory. HEAD: Normocephalic, atraumatic.  EYES: Pupils equal, round, reactive to light.  No scleral icterus.  MOUTH: Nose/mouth/throat not examined due to masking requirements for COVID 19. NECK: Supple. No thyromegaly. No nodules. No JVD.  Trachea midline, no  crepitus. PULMONARY: Lungs clear to auscultation bilaterally.  Symmetrical air entry.  No accessory muscle use. CARDIOVASCULAR: S1 and S2. Regular rate and rhythm.  No  rubs murmurs gallops heard. GASTROINTESTINAL: Obese abdomen otherwise benign. MUSCULOSKELETAL: No joint deformity, no clubbing, no edema.  NEUROLOGIC: Awake, alert, mild psychomotor retardation.  No overt focal deficits.  Speech is fluent. SKIN: Intact,warm,dry.  No overt rashes noted on limited exam. PSYCH: Flat affect.  Mild psychomotor retardation as noted above.  Recent chest x-ray performed 09 August 2019.  No pulmonary pathology evident there is straightening of the left cardiac border.  Implantable stimulator in place.     Assessment & Plan:   Presumed hemoptysis Patient has had symptoms for 6 months Dry cough in the mornings only related to postnasal drip none during the day Episode of "hemoptysis" does not correlate with cough Spontaneous "spitting up" of blood No episode for 2 weeks now Noted significant gastroesophageal reflux to mid level of the esophagus No EGD recently, last 2019 Patient is on Pradaxa which is associated with increased issues with bleeding Will obtain CT angio chest Follow-up after CT angio chest Bronchoscopy will be of low yield if CT angio negative and no "hemoptysis" currently Consider switching Pradaxa to apixaban  Orthopnea Negative cardiac work-up May be related to obesity No chronic respiratory issues noted  Polypharmacy There appears to be duplication of medications (PPI) Defer to primary care physician  Obesity, BMI 39.5 with comorbidity (diabetes, chronic pain, etc.) Weight loss recommended This issue adds complexity to her management   C. Derrill Kay, MD [facility]   *This note was dictated using voice recognition software/Dragon.  Despite best efforts to proofread, errors can occur which can change the meaning.  Any change was purely unintentional.

## 2019-09-25 NOTE — Patient Instructions (Signed)
We are going to order a CT scan of the chest with contrast to evaluate your issues with bleeding.  Some of the issues may be related to Pradaxa which is known to cause issues with bleeding.  We will see you in 3 to 4 weeks after CT scan is done.  Please call sooner should any new difficulties arise or should you start spitting up blood again.

## 2019-09-26 ENCOUNTER — Encounter: Payer: Medicare Other | Admitting: Physical Therapy

## 2019-09-27 ENCOUNTER — Other Ambulatory Visit: Payer: Self-pay

## 2019-09-27 ENCOUNTER — Encounter: Payer: Self-pay | Admitting: Physical Therapy

## 2019-09-27 ENCOUNTER — Ambulatory Visit: Payer: Medicare Other | Attending: Neurology | Admitting: Physical Therapy

## 2019-09-27 ENCOUNTER — Encounter: Payer: Self-pay | Admitting: Pulmonary Disease

## 2019-09-27 DIAGNOSIS — M542 Cervicalgia: Secondary | ICD-10-CM | POA: Insufficient documentation

## 2019-09-27 DIAGNOSIS — M436 Torticollis: Secondary | ICD-10-CM | POA: Insufficient documentation

## 2019-09-27 DIAGNOSIS — R293 Abnormal posture: Secondary | ICD-10-CM | POA: Diagnosis present

## 2019-09-27 NOTE — Therapy (Signed)
[place] [facility] [address] [place], [place], [address] Phone: [phone]   Fax:  [phone]  Physical Therapy Treatment  Patient Details  Name: Christine Mcneil MRN: [id number] Date of Birth: April 26, 1966 No data recorded  Encounter Date: 09/27/2019  PT End of Session - 09/27/19 0720    Visit Number  2    Number of Visits  12    Date for PT Re-Evaluation  10/31/19    Authorization - Visit Number  2    Authorization - Number of Visits  10    PT Start Time  0720    PT Stop Time  0808    PT Time Calculation (min)  48 min    Activity Tolerance  Patient tolerated treatment well;Patient limited by pain    Behavior During Therapy  [facility] for tasks assessed/performed       Past Medical History:  Diagnosis Date  . Abdominal pain   . Anxiety and depression   . Atypical facial pain   . Bilateral occipital neuralgia   . Cervical spondylosis without myelopathy   . Chronic daily headache   . Chronic pain   . Chronic prostatitis   . Chronic thoracic back pain   . Depression   . Diabetes mellitus without complication ([place])   . Diabetes mellitus, type II ([place])   . Dysphagia   . Dysrhythmia   . Fibromyalgia   . Hyperlipidemia   . Hypertension   . Insomnia   . Iron deficiency anemia   . Left hip pain   . Low back pain   . Migraines   . Numbness   . Optic neuropathy   . OSA (obstructive sleep apnea)   . Polyarthralgia   . Primary osteoarthritis of both hips   . Prothrombin gene mutation ([place])   . Pseudotumor cerebri   . Rectal bleeding   . Right shoulder pain   . Rotator cuff syndrome   . Stroke ([place])   . Thyroid disease   . TIA (transient ischemic attack) 05/27/2017    Past Surgical History:  Procedure Laterality Date  . ABDOMINAL HYSTERECTOMY     total  . CHOLECYSTECTOMY    . COLONOSCOPY WITH PROPOFOL N/A 03/21/2018   Procedure: COLONOSCOPY WITH PROPOFOL;  Surgeon: Lucilla Lame, MD;  Location: [facility] ENDOSCOPY;  Service:  Endoscopy;  Laterality: N/A;  . ESOPHAGOGASTRODUODENOSCOPY (EGD) WITH PROPOFOL N/A 03/21/2018   Procedure: ESOPHAGOGASTRODUODENOSCOPY (EGD) WITH PROPOFOL;  Surgeon: Lucilla Lame, MD;  Location: [facility] ENDOSCOPY;  Service: Endoscopy;  Laterality: N/A;  . GIVENS CAPSULE STUDY N/A 09/24/2019   Procedure: GIVENS CAPSULE STUDY;  Surgeon: Virgel Manifold, MD;  Location: [facility] ENDOSCOPY;  Service: Endoscopy;  Laterality: N/A;  . JOINT REPLACEMENT    . KNEE SURGERY Left   . ROTATOR CUFF REPAIR Right   . spinal neurostimulator      There were no vitals filed for this visit.  Subjective Assessment - 09/27/19 0719    Subjective  Pt. had to go ER since last PT visit due to a change in meds resulting in abdominal cramping/ pain/ vomiting.  Pt. reports "neck is sore".    Pertinent History  She has a spinal stimulator for her occipital nerve since 2014.  She had this done for occiptal headaches.    Limitations  Sitting    Patient Stated Goals  to be able to turn her head to look behind her and to be able to sleep without being limited by neck pain.  Currently in Pain?  Yes    Pain Score  3     Pain Location  Neck    Pain Orientation  Left    Pain Descriptors / Indicators  Aching    Pain Onset  More than a month ago    Pain Relieving Factors  Pt. discussed spinal stimulator placement/ location and electrodes are attached to occipital nerve.        MH to neck in seated posture while discussing sleeping position/ HEP/ ADL.    There.ex.:  Reviewed HEP Seated scap. Retraction/ posture correction.    Manual tx.:   Supine L/R UT, levator, rotn. Stretches 5x each direction with static holds (as tolerated). Supine manual traction 4x with holds STM to L/R UT, cervical paraspinals, B posterior deltoids 8 min.  Use of Hypervolt in seated posture to L/R upper back/ UT/ paraspinals "feels good"     PT Short Term Goals - 09/19/19 1338      PT SHORT TERM GOAL #1   Title  Pt will improve cervical  AROM rotation L x 5 degrees    Baseline  L 40, R 50    Time  3    Period  Weeks    Status  New      PT SHORT TERM GOAL #2   Title  pt will demonstrate sitting/sleeping posture that promote optimal cervical spine mechanics and pain <2/10    Baseline  pt lacks awareness    Time  3    Period  Weeks    Status  New        PT Long Term Goals - 09/19/19 1340      PT LONG TERM GOAL #1   Title  Pt will be independent with HEP for postural mm/UE/neck strength    Baseline  unable to perform scap retraction    Time  6    Period  Weeks    Status  New      PT LONG TERM GOAL #2   Title  Pt. will increase FOTO from 54 to 70 to improve functional mobility.    Baseline  54    Time  6    Period  Weeks    Status  New      PT LONG TERM GOAL #3   Title  Pt will improve L cervical spine AROM rotation >10 deg to facilitate being able to turn head while driving    Baseline  R 40 deg    Time  6    Period  Weeks    Status  New            Plan - 09/27/19 0720    Clinical Impression Statement  Pain limited cervical rotn. to L and lateral flexion R in seated posture.  (+) L UT muscle tenderness and tightness with palpation over a trigger point.  Marked decrease in reported pain during supine cervical traction/ stretches.  Pt. reports decrease L UT tenderness after tx. during STM/ use of Hypervolt.    Examination-Activity Limitations  Sleep;Sit    Stability/Clinical Decision Making  Stable/Uncomplicated    Clinical Decision Making  Low    Rehab Potential  Good    PT Frequency  2x / week    PT Duration  6 weeks    PT Treatment/Interventions  ADLs/Self Care Home Management;Cryotherapy;Electrical Stimulation;Traction;Moist Heat;Functional mobility training;Therapeutic activities;Therapeutic exercise;Neuromuscular re-education;Manual techniques;Patient/family education;Passive range of motion    PT Next Visit Plan  postural mm retraining, PROM, man tx  PT Home Exercise Plan  Access Code:  [email or web address]    Consulted and Agree with Plan of Care  Patient       Patient will benefit from skilled therapeutic intervention in order to improve the following deficits and impairments:  Improper body mechanics, Pain, Postural dysfunction, Decreased activity tolerance, Decreased endurance, Decreased range of motion, Decreased strength, Impaired flexibility, Decreased safety awareness, Obesity, Impaired UE functional use, Increased muscle spasms  Visit Diagnosis: Neck pain  Neck stiffness  Abnormal posture     Problem List Patient Active Problem List   Diagnosis Date Noted  . MDD (major depressive disorder), recurrent, in full remission ([place]) 09/10/2019  . Peripheral edema 06/28/2019  . Paroxysmal atrial fibrillation ([place]) 06/28/2019  . Episodic tension-type headache, not intractable 06/13/2019  . Atypical angina ([place]) 06/06/2019  . Lumbar radiculopathy 02/21/2019  . MDD (major depressive disorder), recurrent episode, moderate ([place]) 11/28/2018  . PTSD (post-traumatic stress disorder) 11/28/2018  . GAD (generalized anxiety disorder) 11/28/2018  . Insomnia due to mental disorder 11/28/2018  . Severe obesity (BMI >= 40) ([place]) 11/27/2018  . Polyp of sigmoid colon   . Iron deficiency anemia 01/19/2018  . 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 TIA (transient ischemic attack) 06/20/2017  . Primary osteoarthritis of both hips 06/20/2017  . Periodic limb movements of sleep 05/30/2017  . Shortness of breath 01/03/2017  . 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 Chronic prostatitis/chronic pelvic pain syndrome 05/28/2016  . Migraine without aura and without status migrainosus, not intractable 05/11/2016  . Chronic anticoagulation 03/29/2016  . Encounter for monitoring opioid maintenance therapy 11/04/2015  . Dysphagia, neurologic 09/16/2015  . Numbness 09/16/2015  . Anti-cardiolipin antibody positive 09/01/2015  . Prothrombin gene mutation ([place])  09/01/2015  . H/O: CVA (cerebrovascular accident) 06/27/2015  . Anemia, iron deficiency 10/02/2014  . Chronic thoracic back pain 07/24/2014  . Bilateral occipital neuralgia 04/11/2014  . Hypothyroidism, unspecified 10/17/2013  . Type 2 diabetes, HbA1c goal < 7% ([facility]) 10/17/2013  . Cervicogenic headache 07/04/2013  . Cervical spondylosis without myelopathy 11/16/2012  . Rotator cuff syndrome 11/13/2012  . Sinus congestion 11/07/2012  . Abdominal pain 09/28/2012  . Rectal bleeding 09/28/2012  . Depression 02/22/2012  . GERD (gastroesophageal reflux disease) 02/22/2012  . Essential hypertension 02/22/2012  . Obesity, unspecified 02/22/2012  . Neuromuscular disorder ([place]) 02/22/2012  . Pain in joint, pelvic region and thigh 01/17/2012  . Insomnia secondary to chronic pain 10/04/2011  . Right shoulder pain 10/04/2011  . Atypical facial pain 10/03/2011  . Chronic daily headache 10/03/2011  . Fibromyalgia 10/03/2011  . Hyperlipidemia, unspecified 10/03/2011   Pura Spice, PT, DPT # [phone] 09/27/2019, 8:09 AM  [place] [facility] [address] [place], [place], [address] Phone: [phone]   Fax:  [phone]  Name: Christine Mcneil MRN: [id number] Date of Birth: 09/25/1965

## 2019-10-02 ENCOUNTER — Ambulatory Visit
Admission: RE | Admit: 2019-10-02 | Discharge: 2019-10-02 | Disposition: A | Discharge status: Home / Self Care | Payer: Medicare Other | Source: Ambulatory Visit | Attending: Pulmonary Disease | Admitting: Pulmonary Disease

## 2019-10-02 ENCOUNTER — Telehealth: Payer: Self-pay | Admitting: Pulmonary Disease

## 2019-10-02 ENCOUNTER — Other Ambulatory Visit: Payer: Self-pay

## 2019-10-02 DIAGNOSIS — R042 Hemoptysis: Secondary | ICD-10-CM | POA: Diagnosis present

## 2019-10-02 MED ORDER — IOHEXOL 350 MG/ML SOLN
100.0000 mL | Freq: Once | INTRAVENOUS | Status: AC | PRN
  Administered 2019-10-02: 100 mL via INTRAVENOUS

## 2019-10-02 NOTE — Telephone Encounter (Signed)
Received call report from [facility] with [facility] on patient's CTA done on 10/02/19. LG please review the result/impression copied below:   IMPRESSION: No definite evidence of pulmonary embolus. No acute cardiopulmonary abnormality seen.    Please advise, thank you.

## 2019-10-03 ENCOUNTER — Ambulatory Visit: Payer: Medicare Other

## 2019-10-03 DIAGNOSIS — M436 Torticollis: Secondary | ICD-10-CM

## 2019-10-03 DIAGNOSIS — M542 Cervicalgia: Secondary | ICD-10-CM

## 2019-10-03 DIAGNOSIS — R293 Abnormal posture: Secondary | ICD-10-CM

## 2019-10-03 NOTE — Telephone Encounter (Signed)
She was evaluated for "hemoptysis".  I am not convinced that this is coming from the lung.  She is a lifelong never smoker.  Her chest CT was normal.  Her issue has resolved.  She is to call us if this recurs.  She has follow-up appointment.

## 2019-10-03 NOTE — Therapy (Signed)
[place] [facility] [address] [place], [place], [address] Phone: [phone]   Fax:  [phone]  Physical Therapy Treatment  Patient Details  Name: Christine Mcneil MRN: [id number] Date of Birth: [birth year] No data recorded  Encounter Date: 10/03/2019  PT End of Session - 10/03/19 0907    Visit Number  3    Number of Visits  12    Authorization - Visit Number  2    Authorization - Number of Visits  10    PT Start Time  0915    PT Stop Time  1003    PT Time Calculation (min)  48 min       Past Medical History:  Diagnosis Date  . Abdominal pain   . Anxiety and depression   . Atypical facial pain   . Bilateral occipital neuralgia   . Cervical spondylosis without myelopathy   . Chronic daily headache   . Chronic pain   . Chronic pelvic pain in female   . Chronic thoracic back pain   . Depression   . Diabetes mellitus without complication ([place])   . Diabetes mellitus, type II ([place])   . Dysphagia   . Dysrhythmia   . Fibromyalgia   . Hyperlipidemia   . Hypertension   . Insomnia   . Iron deficiency anemia   . Left hip pain   . Low back pain   . Migraines   . Numbness   . Optic neuropathy   . OSA (obstructive sleep apnea)   . Polyarthralgia   . Primary osteoarthritis of both hips   . Prothrombin gene mutation ([place])   . Pseudotumor cerebri   . Rectal bleeding   . Right shoulder pain   . Rotator cuff syndrome   . Stroke ([place])   . Thyroid disease   . TIA (transient ischemic attack) 05/27/2017    Past Surgical History:  Procedure Laterality Date  . ABDOMINAL HYSTERECTOMY     total  . CHOLECYSTECTOMY    . COLONOSCOPY WITH PROPOFOL N/A 03/21/2018   Procedure: COLONOSCOPY WITH PROPOFOL;  Surgeon: Lucilla Lame, MD;  Location: [facility] ENDOSCOPY;  Service: Endoscopy;  Laterality: N/A;  . ESOPHAGOGASTRODUODENOSCOPY (EGD) WITH PROPOFOL N/A 03/21/2018   Procedure: ESOPHAGOGASTRODUODENOSCOPY (EGD) WITH PROPOFOL;  Surgeon: Lucilla Lame, MD;  Location: [facility] ENDOSCOPY;  Service: Endoscopy;  Laterality: N/A;  . GIVENS CAPSULE STUDY N/A 09/24/2019   Procedure: GIVENS CAPSULE STUDY;  Surgeon: Virgel Manifold, MD;  Location: [facility] ENDOSCOPY;  Service: Endoscopy;  Laterality: N/A;  . JOINT REPLACEMENT    . KNEE SURGERY Left   . ROTATOR CUFF REPAIR Right   . spinal neurostimulator      There were no vitals filed for this visit.  Subjective Assessment - 10/03/19 0912    Subjective  Pt is starting to feel better; she is still having some trouble sleeping bc of her neck pain.  She felt really good after PT last time.         Treatment Today: There.ex.:  Reviewed HEP Seated scap. Retraction 2x10 Seated posture correction with lumbar pillow support Supine shoulder flexion x15 and chest press/scap retraction focusing on relaxing UT/lev scap x15  Manual tx.:   Supine L/R UT, levator, rotn. Stretches 5x each direction with static holds (as tolerated). Supine manual traction 5x with holds STM to L/R UT, cervical paraspinals, B posterior deltoids 8 min.  Use of Hypervolt in seated posture to L/R upper back/ UT/ paraspinals "feels good"  PT Education - 10/03/19 1009    Education Details  added UT stretch to HEP 3x30 sec, seated posture with pillow support for lumbar spine and "ears over shoulders"    Person(s) Educated  Patient    Methods  Explanation;Demonstration    Comprehension  Verbalized understanding;Returned demonstration       PT Short Term Goals - 09/19/19 1338      PT SHORT TERM GOAL #1   Title  Pt will improve cervical AROM rotation L x 5 degrees    Baseline  L 40, R 50    Time  3    Period  Weeks    Status  New      PT SHORT TERM GOAL #2   Title  pt will demonstrate sitting/sleeping posture that promote optimal cervical spine mechanics and pain <2/10    Baseline  pt lacks awareness    Time  3    Period  Weeks    Status  New        PT Long Term Goals - 09/19/19 1340      PT  LONG TERM GOAL #1   Title  Pt will be independent with HEP for postural mm/UE/neck strength    Baseline  unable to perform scap retraction    Time  6    Period  Weeks    Status  New      PT LONG TERM GOAL #2   Title  Pt. will increase FOTO from 54 to 70 to improve functional mobility.    Baseline  54    Time  6    Period  Weeks    Status  New      PT LONG TERM GOAL #3   Title  Pt will improve L cervical spine AROM rotation >10 deg to facilitate being able to turn head while driving    Baseline  R 40 deg    Time  6    Period  Weeks    Status  New            Plan - 10/03/19 1010    Clinical Impression Statement  (+) L UT muscle tightness/tenderness with palpation over trigger point.  Her L cervical rotation AROM improved from mod to min restricted after man tx to mm.  She overall tolerated session well.  Demonstrates improved ability to peform scap mm retraction compared to initial evaluation.    Examination-Activity Limitations  Sleep;Sit    Stability/Clinical Decision Making  Stable/Uncomplicated    Rehab Potential  Good    PT Frequency  2x / week    PT Duration  6 weeks    PT Treatment/Interventions  ADLs/Self Care Home Management;Cryotherapy;Electrical Stimulation;Traction;Moist Heat;Functional mobility training;Therapeutic activities;Therapeutic exercise;Neuromuscular re-education;Manual techniques;Patient/family education;Passive range of motion    PT Next Visit Plan  postural mm retraining, PROM, man tx    PT Home Exercise Plan  Access Code: [email or web address]    Consulted and Agree with Plan of Care  Patient       Patient will benefit from skilled therapeutic intervention in order to improve the following deficits and impairments:  Improper body mechanics, Pain, Postural dysfunction, Decreased activity tolerance, Decreased endurance, Decreased range of motion, Decreased strength, Impaired flexibility, Decreased safety awareness, Obesity, Impaired UE functional use, Increased  muscle spasms  Visit Diagnosis: Neck pain  Neck stiffness  Abnormal posture     Problem List Patient Active Problem List   Diagnosis Date Noted  . MDD (major depressive disorder), recurrent, in full  remission ([place]) 09/10/2019  . Peripheral edema 06/28/2019  . Paroxysmal atrial fibrillation ([place]) 06/28/2019  . Episodic tension-type headache, not intractable 06/13/2019  . Atypical angina ([place]) 06/06/2019  . Lumbar radiculopathy 02/21/2019  . MDD (major depressive disorder), recurrent episode, moderate ([place]) 11/28/2018  . PTSD (post-traumatic stress disorder) 11/28/2018  . GAD (generalized anxiety disorder) 11/28/2018  . Insomnia due to mental disorder 11/28/2018  . Severe obesity (BMI >= 40) ([place]) 11/27/2018  . Polyp of sigmoid colon   . Iron deficiency anemia 01/19/2018  . 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 TIA (transient ischemic attack) 06/20/2017  . Primary osteoarthritis of both hips 06/20/2017  . Periodic limb movements of sleep 05/30/2017  . Shortness of breath 01/03/2017  . 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 Chronic pelvic pain in female 05/28/2016  . Migraine without aura and without status migrainosus, not intractable 05/11/2016  . Chronic anticoagulation 03/29/2016  . Encounter for monitoring opioid maintenance therapy 11/04/2015  . Dysphagia, neurologic 09/16/2015  . Numbness 09/16/2015  . Anti-cardiolipin antibody positive 09/01/2015  . Prothrombin gene mutation ([place]) 09/01/2015  . H/O: CVA (cerebrovascular accident) 06/27/2015  . Anemia, iron deficiency 10/02/2014  . Chronic thoracic back pain 07/24/2014  . Bilateral occipital neuralgia 04/11/2014  . Hypothyroidism, unspecified 10/17/2013  . Type 2 diabetes, HbA1c goal < 7% (HCC) 10/17/2013  . Cervicogenic headache 07/04/2013  . Cervical spondylosis without myelopathy 11/16/2012  . Rotator cuff syndrome 11/13/2012  . Sinus congestion 11/07/2012  . Abdominal pain  09/28/2012  . Rectal bleeding 09/28/2012  . Depression 02/22/2012  . GERD (gastroesophageal reflux disease) 02/22/2012  . Essential hypertension 02/22/2012  . Obesity, unspecified 02/22/2012  . Neuromuscular disorder ([place]) 02/22/2012  . Pain in joint, pelvic region and thigh 01/17/2012  . Insomnia secondary to chronic pain 10/04/2011  . Right shoulder pain 10/04/2011  . Atypical facial pain 10/03/2011  . Chronic daily headache 10/03/2011  . Fibromyalgia 10/03/2011  . Hyperlipidemia, unspecified 10/03/2011    Pincus Badder 10/03/2019, 10:13 AM Merdis Delay, PT, DPT   [facility] [address] [place], [place], [address] Phone: [phone]   Fax:  [phone]  Name: Christine Mcneil MRN: [id number] Date of Birth: 03-12-1966

## 2019-10-10 ENCOUNTER — Ambulatory Visit: Payer: Medicare Other

## 2019-10-10 ENCOUNTER — Other Ambulatory Visit: Payer: Self-pay

## 2019-10-10 DIAGNOSIS — R293 Abnormal posture: Secondary | ICD-10-CM

## 2019-10-10 DIAGNOSIS — M542 Cervicalgia: Secondary | ICD-10-CM | POA: Diagnosis not present

## 2019-10-10 DIAGNOSIS — M436 Torticollis: Secondary | ICD-10-CM

## 2019-10-10 NOTE — Therapy (Signed)
[place] [facility] [address]. [place], [place], [address] Phone: [phone]   Fax:  [phone]  Physical Therapy Treatment  Patient Details  Name: Christine Mcneil MRN: [id number] Date of Birth: 02-05-1966 No data recorded  Encounter Date: 10/10/2019  PT End of Session - 10/10/19 1228    Visit Number  4    Number of Visits  12    Authorization - Visit Number  3    Authorization - Number of Visits  10    PT Start Time  0945    PT Stop Time  1030    PT Time Calculation (min)  45 min       Past Medical History:  Diagnosis Date  . Abdominal pain   . Anxiety and depression   . Atypical facial pain   . Bilateral occipital neuralgia   . Cervical spondylosis without myelopathy   . Chronic daily headache   . Chronic pain   . Chronic pelvic pain in female   . Chronic thoracic back pain   . Depression   . Diabetes mellitus without complication ([place])   . Diabetes mellitus, type II ([place])   . Dysphagia   . Dysrhythmia   . Fibromyalgia   . Hyperlipidemia   . Hypertension   . Insomnia   . Iron deficiency anemia   . Left hip pain   . Low back pain   . Migraines   . Numbness   . Optic neuropathy   . OSA (obstructive sleep apnea)   . Polyarthralgia   . Primary osteoarthritis of both hips   . Prothrombin gene mutation ([place])   . Pseudotumor cerebri   . Rectal bleeding   . Right shoulder pain   . Rotator cuff syndrome   . Stroke ([place])   . Thyroid disease   . TIA (transient ischemic attack) 05/27/2017    Past Surgical History:  Procedure Laterality Date  . ABDOMINAL HYSTERECTOMY     total  . CHOLECYSTECTOMY    . COLONOSCOPY WITH PROPOFOL N/A 03/21/2018   Procedure: COLONOSCOPY WITH PROPOFOL;  Surgeon: Lucilla Lame, MD;  Location[facility] ENDOSCOPY;  Service: Endoscopy;  Laterality: N/A;  . ESOPHAGOGASTRODUODENOSCOPY (EGD) WITH PROPOFOL N/A 03/21/2018   Procedure: ESOPHAGOGASTRODUODENOSCOPY (EGD) WITH PROPOFOL;  Surgeon: Lucilla Lame, MD;  Location: [facility] ENDOSCOPY;  Service: Endoscopy;  Laterality: N/A;  . GIVENS CAPSULE STUDY N/A 09/24/2019   Procedure: GIVENS CAPSULE STUDY;  Surgeon: Virgel Manifold, MD;  Location: [facility] ENDOSCOPY;  Service: Endoscopy;  Laterality: N/A;  . JOINT REPLACEMENT    . KNEE SURGERY Left   . ROTATOR CUFF REPAIR Right   . spinal neurostimulator      There were no vitals filed for this visit.  Subjective Assessment - 10/10/19 0948    Subjective  Pt is feeling better, she bought an Special educational needs teacher for her neck and that helps with the pain.  She is sleeping better.       Treatment Today: There.ex.:  Reviewed HEP Seated scap. Retraction 2x10 Seated posture correction with lumbar pillow support Scapular retraction with row using red t-band 2x10, added to HEP  Manual tx.:   Supine L/R UT, levator, rotn. Stretches 5x each direction with static holds (as tolerated). Supine manual traction 5x with holds STM to L/R UT, cervical paraspinals, B posterior deltoids, pec mm Use of Hypervolt in seated posture to L/R upper back/ UT/ paraspinals      PT Short Term Goals - 10/10/19 1229  PT SHORT TERM GOAL #1   Title  Pt will improve cervical AROM rotation L x 5 degrees    Baseline  L 40, R 50, 10/10/19: L rot 50, R 55    Time  3    Period  Weeks    Status  New      PT SHORT TERM GOAL #2   Title  pt will demonstrate sitting/sleeping posture that promote optimal cervical spine mechanics and pain <2/10    Baseline  pt lacks awareness    Time  3    Period  Weeks    Status  New        PT Long Term Goals - 09/19/19 1340      PT LONG TERM GOAL #1   Title  Pt will be independent with HEP for postural mm/UE/neck strength    Baseline  unable to perform scap retraction    Time  6    Period  Weeks    Status  New      PT LONG TERM GOAL #2   Title  Pt. will increase FOTO from 54 to 70 to improve functional mobility.    Baseline  54    Time  6    Period  Weeks     Status  New      PT LONG TERM GOAL #3   Title  Pt will improve L cervical spine AROM rotation >10 deg to facilitate being able to turn head while driving    Baseline  R 40 deg    Time  6    Period  Weeks    Status  New            Plan - 10/10/19 1228    Clinical Impression Statement  Pt's cervical spine AROM is improving.  Progressed scapular retraining exercises today.  Overall, making progress with tx.    Examination-Activity Limitations  Sleep;Sit    Stability/Clinical Decision Making  Stable/Uncomplicated    Rehab Potential  Good    PT Frequency  2x / week    PT Duration  6 weeks    PT Treatment/Interventions  ADLs/Self Care Home Management;Cryotherapy;Electrical Stimulation;Traction;Moist Heat;Functional mobility training;Therapeutic activities;Therapeutic exercise;Neuromuscular re-education;Manual techniques;Patient/family education;Passive range of motion    PT Next Visit Plan  postural mm retraining, PROM, man tx    PT Home Exercise Plan  Access Code: [email or web address]    Consulted and Agree with Plan of Care  Patient       Patient will benefit from skilled therapeutic intervention in order to improve the following deficits and impairments:  Improper body mechanics, Pain, Postural dysfunction, Decreased activity tolerance, Decreased endurance, Decreased range of motion, Decreased strength, Impaired flexibility, Decreased safety awareness, Obesity, Impaired UE functional use, Increased muscle spasms  Visit Diagnosis: Neck pain  Neck stiffness  Abnormal posture     Problem List Patient Active Problem List   Diagnosis Date Noted  . MDD (major depressive disorder), recurrent, in full remission ([place]) 09/10/2019  . Peripheral edema 06/28/2019  . Paroxysmal atrial fibrillation ([place]) 06/28/2019  . Episodic tension-type headache, not intractable 06/13/2019  . Atypical angina ([place]) 06/06/2019  . Lumbar radiculopathy 02/21/2019  . MDD (major depressive disorder), recurrent  episode, moderate ([place]) 11/28/2018  . PTSD (post-traumatic stress disorder) 11/28/2018  . GAD (generalized anxiety disorder) 11/28/2018  . Insomnia due to mental disorder 11/28/2018  . Severe obesity (BMI >= 40) ([place]) 11/27/2018  . Polyp of sigmoid colon   . Iron deficiency anemia 01/19/2018  .  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 TIA (transient ischemic attack) 06/20/2017  . Primary osteoarthritis of both hips 06/20/2017  . Periodic limb movements of sleep 05/30/2017  . Shortness of breath 01/03/2017  . 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 Chronic pelvic pain in female 05/28/2016  . Migraine without aura and without status migrainosus, not intractable 05/11/2016  . Chronic anticoagulation 03/29/2016  . Encounter for monitoring opioid maintenance therapy 11/04/2015  . Dysphagia, neurologic 09/16/2015  . Numbness 09/16/2015  . Anti-cardiolipin antibody positive 09/01/2015  . Prothrombin gene mutation ([place]) 09/01/2015  . H/O: CVA (cerebrovascular accident) 06/27/2015  . Anemia, iron deficiency 10/02/2014  . Chronic thoracic back pain 07/24/2014  . Bilateral occipital neuralgia 04/11/2014  . Hypothyroidism, unspecified 10/17/2013  . Type 2 diabetes, HbA1c goal < 7% (HCC) 10/17/2013  . Cervicogenic headache 07/04/2013  . Cervical spondylosis without myelopathy 11/16/2012  . Rotator cuff syndrome 11/13/2012  . Sinus congestion 11/07/2012  . Abdominal pain 09/28/2012  . Rectal bleeding 09/28/2012  . Depression 02/22/2012  . GERD (gastroesophageal reflux disease) 02/22/2012  . Essential hypertension 02/22/2012  . Obesity, unspecified 02/22/2012  . Neuromuscular disorder ([place]) 02/22/2012  . Pain in joint, pelvic region and thigh 01/17/2012  . Insomnia secondary to chronic pain 10/04/2011  . Right shoulder pain 10/04/2011  . Atypical facial pain 10/03/2011  . Chronic daily headache 10/03/2011  . Fibromyalgia 10/03/2011  . Hyperlipidemia,  unspecified 10/03/2011    Pincus Badder 10/10/2019, 12:30 PM Merdis Delay, PT, DPT    [facility] [address] [place], [place], [address] Phone: [phone]   Fax:  [phone]  Name: ANOOP AMEND MRN: [id number] Date of Birth: Sep 18, 1965

## 2019-10-11 ENCOUNTER — Other Ambulatory Visit: Payer: Self-pay | Admitting: Gastroenterology

## 2019-10-11 ENCOUNTER — Telehealth: Payer: Self-pay

## 2019-10-11 ENCOUNTER — Other Ambulatory Visit: Payer: Self-pay

## 2019-10-11 ENCOUNTER — Ambulatory Visit
Admission: RE | Admit: 2019-10-11 | Discharge: 2019-10-11 | Disposition: A | Discharge status: Home / Self Care | Payer: Medicare Other | Source: Ambulatory Visit | Attending: Gastroenterology | Admitting: Gastroenterology

## 2019-10-11 DIAGNOSIS — T189XXS Foreign body of alimentary tract, part unspecified, sequela: Secondary | ICD-10-CM | POA: Diagnosis not present

## 2019-10-11 NOTE — Telephone Encounter (Signed)
Patient was contacted and had to leave her a detailed message letting her know that her capsule study was inconclusive as the capsule did not enter the small bowel. Therefore, Dr. Bonna Gains had ordered an abdominal x-ray to verify location of capsule and to ensure it has exited her small bowel. Patient was told to go to the [facility] and that she did not need an appointment to have this done.

## 2019-10-11 NOTE — Telephone Encounter (Signed)
-----   Message from Virgel Manifold, MD sent at 10/11/2019  9:40 AM EDT ----- Please tell this patient her capsule study was inconclusive as the capsule did not enter the small bowel. I have ordered an abdominal xray to verify location of capsule and to ensure it has exited her small bowel. Please ask her to get this done. She should follow up in clinic as scheduled for further steps

## 2019-10-16 ENCOUNTER — Other Ambulatory Visit: Payer: Self-pay

## 2019-10-16 ENCOUNTER — Ambulatory Visit: Payer: Medicare Other | Admitting: Physical Therapy

## 2019-10-16 ENCOUNTER — Encounter: Payer: Self-pay | Admitting: Physical Therapy

## 2019-10-16 DIAGNOSIS — M542 Cervicalgia: Secondary | ICD-10-CM

## 2019-10-16 DIAGNOSIS — R293 Abnormal posture: Secondary | ICD-10-CM

## 2019-10-16 DIAGNOSIS — M436 Torticollis: Secondary | ICD-10-CM

## 2019-10-16 NOTE — Therapy (Signed)
[facility] [address] [place], [place], [address] Phone: [phone]   Fax:  [phone]  Physical Therapy Treatment  Patient Details  Name: Christine Mcneil MRN: [id number] Date of Birth: 08-31-1965 No data recorded  Encounter Date: 10/16/2019  PT End of Session - 10/18/19 1014    Visit Number  5    Number of Visits  12    Date for PT Re-Evaluation  10/31/19    Authorization - Visit Number  5    Authorization - Number of Visits  10    PT Start Time  [id number]    PT Stop Time  1440    PT Time Calculation (min)  48 min    Activity Tolerance  Patient tolerated treatment well    Behavior During Therapy  [facility] for tasks assessed/performed       Past Medical History:  Diagnosis Date  . Abdominal pain   . Anxiety and depression   . Atypical facial pain   . Bilateral occipital neuralgia   . Cervical spondylosis without myelopathy   . Chronic daily headache   . Chronic pain   . Chronic pelvic pain in female   . Chronic thoracic back pain   . Depression   . Diabetes mellitus without complication ([place])   . Diabetes mellitus, type II ([place])   . Dysphagia   . Dysrhythmia   . Fibromyalgia   . Hyperlipidemia   . Hypertension   . Insomnia   . Iron deficiency anemia   . Left hip pain   . Low back pain   . Migraines   . Numbness   . Optic neuropathy   . OSA (obstructive sleep apnea)   . Polyarthralgia   . Primary osteoarthritis of both hips   . Prothrombin gene mutation ([place])   . Pseudotumor cerebri   . Rectal bleeding   . Right shoulder pain   . Rotator cuff syndrome   . Stroke ([place])   . Thyroid disease   . TIA (transient ischemic attack) 05/27/2017    Past Surgical History:  Procedure Laterality Date  . ABDOMINAL HYSTERECTOMY     total  . CHOLECYSTECTOMY    . COLONOSCOPY WITH PROPOFOL N/A 03/21/2018   Procedure: COLONOSCOPY WITH PROPOFOL;  Surgeon: Lucilla Lame, MD;  Location: [facility] ENDOSCOPY;  Service: Endoscopy;   Laterality: N/A;  . ESOPHAGOGASTRODUODENOSCOPY (EGD) WITH PROPOFOL N/A 03/21/2018   Procedure: ESOPHAGOGASTRODUODENOSCOPY (EGD) WITH PROPOFOL;  Surgeon: Lucilla Lame, MD;  Location: [facility] ENDOSCOPY;  Service: Endoscopy;  Laterality: N/A;  . GIVENS CAPSULE STUDY N/A 09/24/2019   Procedure: GIVENS CAPSULE STUDY;  Surgeon: Virgel Manifold, MD;  Location: [facility] ENDOSCOPY;  Service: Endoscopy;  Laterality: N/A;  . JOINT REPLACEMENT    . KNEE SURGERY Left   . ROTATOR CUFF REPAIR Right   . spinal neurostimulator      There were no vitals filed for this visit.  Subjective Assessment - 10/18/19 1011    Subjective  Pt. reports no pain at this time and states she is doing better.  Pt. reports compliance with HEP.    Pertinent History  She has a spinal stimulator for her occipital nerve since 2014.  She had this done for occiptal headaches.    Patient Stated Goals  to be able to turn her head to look behind her and to be able to sleep without being limited by neck pain.    Currently in Pain?  No/denies  Treatment Today:  There.ex.:  Supine 3# chest press/ shoulder flexion/ horizontal abduction 20x.   Scapular retraction with row using red t-band 2x10. Reviewed HEP  Manual tx.:   Supine L/R UT, levator, rotn. Stretches 5x each direction with static holds (as tolerated). Supine manual cervical traction5x with holds STM to L/R UT, cervical paraspinals, B posterior deltoids, pec mm Use of Hypervolt in seated posture to L/R upper back/ UT/ paraspinals    PT Short Term Goals - 10/10/19 1229      PT SHORT TERM GOAL #1   Title  Pt will improve cervical AROM rotation L x 5 degrees    Baseline  L 40, R 50, 10/10/19: L rot 50, R 55    Time  3    Period  Weeks    Status  New      PT SHORT TERM GOAL #2   Title  pt will demonstrate sitting/sleeping posture that promote optimal cervical spine mechanics and pain <2/10    Baseline  pt lacks awareness    Time  3    Period  Weeks     Status  New        PT Long Term Goals - 09/19/19 1340      PT LONG TERM GOAL #1   Title  Pt will be independent with HEP for postural mm/UE/neck strength    Baseline  unable to perform scap retraction    Time  6    Period  Weeks    Status  New      PT LONG TERM GOAL #2   Title  Pt. will increase FOTO from 54 to 70 to improve functional mobility.    Baseline  54    Time  6    Period  Weeks    Status  New      PT LONG TERM GOAL #3   Title  Pt will improve L cervical spine AROM rotation >10 deg to facilitate being able to turn head while driving    Baseline  R 40 deg    Time  6    Period  Weeks    Status  New            Plan - 10/18/19 1016    Clinical Impression Statement  Decrease UT tenderness with palpation and marked increase in cervical rotn./ upper trap. ROM.  Pt. reports a good stretch/ decrease symptoms during static UT/levator stretches in supine position.  Pt. able to complete 3# UE ex. with proper technique and no increase neck pain.    Examination-Activity Limitations  Sleep;Sit    Stability/Clinical Decision Making  Stable/Uncomplicated    Clinical Decision Making  Low    Rehab Potential  Good    PT Frequency  2x / week    PT Duration  6 weeks    PT Treatment/Interventions  ADLs/Self Care Home Management;Cryotherapy;Electrical Stimulation;Traction;Moist Heat;Functional mobility training;Therapeutic activities;Therapeutic exercise;Neuromuscular re-education;Manual techniques;Patient/family education;Passive range of motion    PT Next Visit Plan  Progress posture/ strength training.  Issue new HEP    PT Home Exercise Plan  Access Code: [email or web address]    Consulted and Agree with Plan of Care  Patient       Patient will benefit from skilled therapeutic intervention in order to improve the following deficits and impairments:  Improper body mechanics, Pain, Postural dysfunction, Decreased activity tolerance, Decreased endurance, Decreased range of motion, Decreased  strength, Impaired flexibility, Decreased safety awareness, Obesity, Impaired UE functional use, Increased muscle spasms  Visit Diagnosis: Neck pain  Neck stiffness  Abnormal posture     Problem List Patient Active Problem List   Diagnosis Date Noted  . MDD (major depressive disorder), recurrent, in full remission ([place]) 09/10/2019  . Peripheral edema 06/28/2019  . Paroxysmal atrial fibrillation ([place]) 06/28/2019  . Episodic tension-type headache, not intractable 06/13/2019  . Atypical angina ([place]) 06/06/2019  . Lumbar radiculopathy 02/21/2019  . MDD (major depressive disorder), recurrent episode, moderate ([place]) 11/28/2018  . PTSD (post-traumatic stress disorder) 11/28/2018  . GAD (generalized anxiety disorder) 11/28/2018  . Insomnia due to mental disorder 11/28/2018  . Severe obesity (BMI >= 40) ([place]) 11/27/2018  . Polyp of sigmoid colon   . Iron deficiency anemia 01/19/2018  . 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 TIA (transient ischemic attack) 06/20/2017  . Primary osteoarthritis of both hips 06/20/2017  . Periodic limb movements of sleep 05/30/2017  . Shortness of breath 01/03/2017  . 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 Chronic pelvic pain in female 05/28/2016  . Migraine without aura and without status migrainosus, not intractable 05/11/2016  . Chronic anticoagulation 03/29/2016  . Encounter for monitoring opioid maintenance therapy 11/04/2015  . Dysphagia, neurologic 09/16/2015  . Numbness 09/16/2015  . Anti-cardiolipin antibody positive 09/01/2015  . Prothrombin gene mutation ([place]) 09/01/2015  . H/O: CVA (cerebrovascular accident) 06/27/2015  . Anemia, iron deficiency 10/02/2014  . Chronic thoracic back pain 07/24/2014  . Bilateral occipital neuralgia 04/11/2014  . Hypothyroidism, unspecified 10/17/2013  . Type 2 diabetes, HbA1c goal < 7% ([facility]) 10/17/2013  . Cervicogenic headache 07/04/2013  . Cervical spondylosis without  myelopathy 11/16/2012  . Rotator cuff syndrome 11/13/2012  . Sinus congestion 11/07/2012  . Abdominal pain 09/28/2012  . Rectal bleeding 09/28/2012  . Depression 02/22/2012  . GERD (gastroesophageal reflux disease) 02/22/2012  . Essential hypertension 02/22/2012  . Obesity, unspecified 02/22/2012  . Neuromuscular disorder ([place]) 02/22/2012  . Pain in joint, pelvic region and thigh 01/17/2012  . Insomnia secondary to chronic pain 10/04/2011  . Right shoulder pain 10/04/2011  . Atypical facial pain 10/03/2011  . Chronic daily headache 10/03/2011  . Fibromyalgia 10/03/2011  . Hyperlipidemia, unspecified 10/03/2011   Pura Spice, PT, DPT # [phone] 10/18/2019, 10:26 AM  [facility] [address] [place], [place], [address] Phone: [phone]   Fax:  [phone]  Name: Christine Mcneil MRN: [id number] Date of Birth: July 12, 1965

## 2019-10-23 ENCOUNTER — Encounter: Payer: Self-pay | Admitting: Physical Therapy

## 2019-10-23 ENCOUNTER — Other Ambulatory Visit: Payer: Self-pay

## 2019-10-23 ENCOUNTER — Ambulatory Visit: Payer: Medicare Other | Attending: Neurology

## 2019-10-23 DIAGNOSIS — M542 Cervicalgia: Secondary | ICD-10-CM

## 2019-10-23 DIAGNOSIS — R296 Repeated falls: Secondary | ICD-10-CM

## 2019-10-23 DIAGNOSIS — R269 Unspecified abnormalities of gait and mobility: Secondary | ICD-10-CM

## 2019-10-23 DIAGNOSIS — M436 Torticollis: Secondary | ICD-10-CM | POA: Diagnosis present

## 2019-10-23 DIAGNOSIS — M6281 Muscle weakness (generalized): Secondary | ICD-10-CM

## 2019-10-23 DIAGNOSIS — R293 Abnormal posture: Secondary | ICD-10-CM | POA: Diagnosis present

## 2019-10-23 NOTE — Therapy (Signed)
[facility] [address] [place], [place], [address] Phone: [phone]   Fax:  [phone]  Physical Therapy Treatment  Patient Details  Name: Christine Mcneil MRN: [id number] Date of Birth: 22-May-1966 No data recorded  Encounter Date: 10/23/2019  PT End of Session - 10/23/19 1444    Visit Number  6    Number of Visits  12    Date for PT Re-Evaluation  10/31/19    Authorization - Visit Number  6    Authorization - Number of Visits  10    PT Start Time  [id number]    PT Stop Time  1525    PT Time Calculation (min)  40 min    Activity Tolerance  Patient tolerated treatment well    Behavior During Therapy  [facility] for tasks assessed/performed       Past Medical History:  Diagnosis Date  . Abdominal pain   . Anxiety and depression   . Atypical facial pain   . Bilateral occipital neuralgia   . Cervical spondylosis without myelopathy   . Chronic daily headache   . Chronic pain   . Chronic pelvic pain in female   . Chronic thoracic back pain   . Depression   . Diabetes mellitus without complication ([place])   . Diabetes mellitus, type II ([place])   . Dysphagia   . Dysrhythmia   . Fibromyalgia   . Hyperlipidemia   . Hypertension   . Insomnia   . Iron deficiency anemia   . Left hip pain   . Low back pain   . Migraines   . Numbness   . Optic neuropathy   . OSA (obstructive sleep apnea)   . Polyarthralgia   . Primary osteoarthritis of both hips   . Prothrombin gene mutation ([place])   . Pseudotumor cerebri   . Rectal bleeding   . Right shoulder pain   . Rotator cuff syndrome   . Stroke ([place])   . Thyroid disease   . TIA (transient ischemic attack) 05/27/2017    Past Surgical History:  Procedure Laterality Date  . ABDOMINAL HYSTERECTOMY     total  . CHOLECYSTECTOMY    . COLONOSCOPY WITH PROPOFOL N/A 03/21/2018   Procedure: COLONOSCOPY WITH PROPOFOL;  Surgeon: Lucilla Lame, MD;  Location: [facility] ENDOSCOPY;  Service: Endoscopy;   Laterality: N/A;  . ESOPHAGOGASTRODUODENOSCOPY (EGD) WITH PROPOFOL N/A 03/21/2018   Procedure: ESOPHAGOGASTRODUODENOSCOPY (EGD) WITH PROPOFOL;  Surgeon: Lucilla Lame, MD;  Location: [facility] ENDOSCOPY;  Service: Endoscopy;  Laterality: N/A;  . GIVENS CAPSULE STUDY N/A 09/24/2019   Procedure: GIVENS CAPSULE STUDY;  Surgeon: Virgel Manifold, MD;  Location: [facility] ENDOSCOPY;  Service: Endoscopy;  Laterality: N/A;  . JOINT REPLACEMENT    . KNEE SURGERY Left   . ROTATOR CUFF REPAIR Right   . spinal neurostimulator      There were no vitals filed for this visit.  Subjective Assessment - 10/23/19 1448    Subjective  Patient reported a ton of chest muscle soreness that lasted about 48 hours. None currently. Took a break from her HEP for day, and then started again.    Pertinent History  She has a spinal stimulator for her occipital nerve since 2014.  She had this done for occiptal headaches.    Limitations  Sitting    Patient Stated Goals  to be able to turn her head to look behind her and to be able to sleep without being limited by neck pain.  Currently in Pain?  Yes    Pain Score  3     Pain Location  Head    Pain Orientation  Other (Comment)    Pain Descriptors / Indicators  Aching    Pain Type  Chronic pain    Pain Onset  More than a month ago    Pain Frequency  Intermittent         There.ex.:   Supine 2# chest press/ shoulder flexion/ horizontal abduction 20x.   Scapular retraction with row using red t-band 2x10.   Manual tx.:    Supine L/R UT, levator, rotn. Stretches 5x each direction with static holds (as tolerated). Supine manual cervical traction 5x with holds STM to L/R UT, cervical paraspinals, B posterior deltoids, pec mm Use of Hypervolt in seated posture to L/R upper back/ UT/ paraspinals    pt response/clinical impression: The patient reported resolvement of headache after STM and stretches. Did indicate UE soreness after exercise, educated on DOMs and expectations  of reasonable muscle soreness. Cueing for technique intermittently.      PT Education - 10/23/19 1443    Education Details  HEP, therapeutic exercises    Person(s) Educated  Patient    Methods  Explanation;Demonstration    Comprehension  Verbalized understanding;Returned demonstration       PT Short Term Goals - 10/10/19 1229      PT SHORT TERM GOAL #1   Title  Pt will improve cervical AROM rotation L x 5 degrees    Baseline  L 40, R 50, 10/10/19: L rot 50, R 55    Time  3    Period  Weeks    Status  New      PT SHORT TERM GOAL #2   Title  pt will demonstrate sitting/sleeping posture that promote optimal cervical spine mechanics and pain <2/10    Baseline  pt lacks awareness    Time  3    Period  Weeks    Status  New        PT Long Term Goals - 09/19/19 1340      PT LONG TERM GOAL #1   Title  Pt will be independent with HEP for postural mm/UE/neck strength    Baseline  unable to perform scap retraction    Time  6    Period  Weeks    Status  New      PT LONG TERM GOAL #2   Title  Pt. will increase FOTO from 54 to 70 to improve functional mobility.    Baseline  54    Time  6    Period  Weeks    Status  New      PT LONG TERM GOAL #3   Title  Pt will improve L cervical spine AROM rotation >10 deg to facilitate being able to turn head while driving    Baseline  R 40 deg    Time  6    Period  Weeks    Status  New            Plan - 10/23/19 1443    Clinical Impression Statement  The patient reported resolvement of headache after STM and stretches. Did indicate UE soreness after exercise, educated on DOMs and expectations of reasonable muscle soreness. Cueing for technique intermittently.    Examination-Activity Limitations  Sleep;Sit    Rehab Potential  Good    PT Frequency  2x / week    PT Duration  6  weeks    PT Treatment/Interventions  ADLs/Self Care Home Management;Cryotherapy;Electrical Stimulation;Traction;Moist Heat;Functional mobility  training;Therapeutic activities;Therapeutic exercise;Neuromuscular re-education;Manual techniques;Patient/family education;Passive range of motion    PT Next Visit Plan  Progress posture/ strength training.  Issue new HEP    PT Home Exercise Plan  Access Code: [email or web address]    Consulted and Agree with Plan of Care  Patient       Patient will benefit from skilled therapeutic intervention in order to improve the following deficits and impairments:  Improper body mechanics, Pain, Postural dysfunction, Decreased activity tolerance, Decreased endurance, Decreased range of motion, Decreased strength, Impaired flexibility, Decreased safety awareness, Obesity, Impaired UE functional use, Increased muscle spasms  Visit Diagnosis: Neck pain  Neck stiffness  Abnormal posture  Gait difficulty  Repeated falls  Muscle weakness (generalized)     Problem List Patient Active Problem List   Diagnosis Date Noted  . MDD (major depressive disorder), recurrent, in full remission ([place]) 09/10/2019  . Peripheral edema 06/28/2019  . Paroxysmal atrial fibrillation ([place]) 06/28/2019  . Episodic tension-type headache, not intractable 06/13/2019  . Atypical angina ([place]) 06/06/2019  . Lumbar radiculopathy 02/21/2019  . MDD (major depressive disorder), recurrent episode, moderate ([place]) 11/28/2018  . PTSD (post-traumatic stress disorder) 11/28/2018  . GAD (generalized anxiety disorder) 11/28/2018  . Insomnia due to mental disorder 11/28/2018  . Severe obesity (BMI >= 40) ([place]) 11/27/2018  . Polyp of sigmoid colon   . Iron deficiency anemia 01/19/2018  . 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 TIA (transient ischemic attack) 06/20/2017  . Primary osteoarthritis of both hips 06/20/2017  . Periodic limb movements of sleep 05/30/2017  . Shortness of breath 01/03/2017  . 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 Chronic pelvic pain in female 05/28/2016  . Migraine without aura and  without status migrainosus, not intractable 05/11/2016  . Chronic anticoagulation 03/29/2016  . Encounter for monitoring opioid maintenance therapy 11/04/2015  . Dysphagia, neurologic 09/16/2015  . Numbness 09/16/2015  . Anti-cardiolipin antibody positive 09/01/2015  . Prothrombin gene mutation ([place]) 09/01/2015  . H/O: CVA (cerebrovascular accident) 06/27/2015  . Anemia, iron deficiency 10/02/2014  . Chronic thoracic back pain 07/24/2014  . Bilateral occipital neuralgia 04/11/2014  . Hypothyroidism, unspecified 10/17/2013  . Type 2 diabetes, HbA1c goal < 7% ([facility]) 10/17/2013  . Cervicogenic headache 07/04/2013  . Cervical spondylosis without myelopathy 11/16/2012  . Rotator cuff syndrome 11/13/2012  . Sinus congestion 11/07/2012  . Abdominal pain 09/28/2012  . Rectal bleeding 09/28/2012  . Depression 02/22/2012  . GERD (gastroesophageal reflux disease) 02/22/2012  . Essential hypertension 02/22/2012  . Obesity, unspecified 02/22/2012  . Neuromuscular disorder ([place]) 02/22/2012  . Pain in joint, pelvic region and thigh 01/17/2012  . Insomnia secondary to chronic pain 10/04/2011  . Right shoulder pain 10/04/2011  . Atypical facial pain 10/03/2011  . Chronic daily headache 10/03/2011  . Fibromyalgia 10/03/2011  . Hyperlipidemia, unspecified 10/03/2011    Lieutenant Diego PT, DPT 3:37 PM,10/23/19   [facility] [address] [place], [place], [address] Phone: [phone]   Fax:  [phone]  Name: Christine Mcneil MRN: [id number] Date of Birth: [birth year]

## 2019-10-29 ENCOUNTER — Encounter: Payer: Self-pay | Admitting: Adult Health

## 2019-10-29 ENCOUNTER — Ambulatory Visit (INDEPENDENT_AMBULATORY_CARE_PROVIDER_SITE_OTHER): Payer: Medicare Other | Admitting: Adult Health

## 2019-10-29 ENCOUNTER — Other Ambulatory Visit: Payer: Self-pay

## 2019-10-29 DIAGNOSIS — R05 Cough: Secondary | ICD-10-CM

## 2019-10-29 DIAGNOSIS — R053 Chronic cough: Secondary | ICD-10-CM

## 2019-10-29 DIAGNOSIS — R042 Hemoptysis: Secondary | ICD-10-CM | POA: Insufficient documentation

## 2019-10-29 NOTE — Patient Instructions (Addendum)
Delsym 2 tsp Twice daily  As needed  Cough  Discuss with primary provider or cardiology that Accupril may aggravate your cough and may need to be changed.  Call back if your bleeding returns .  Follow up in 3 months with Dr. Patsey Berthold and As needed   Please contact office for sooner follow up if symptoms do not improve or worsen or seek emergency care

## 2019-10-29 NOTE — Assessment & Plan Note (Signed)
Patient complains of a daily dry cough.  She does have some postnasal drainage.  Recent CT chest was negative and unrevealing.  Have suggested that she continue on Flonase.  Add in Delsym for cough control. May need to consider changing ACE inhibitor. If symptoms do not improve will need further evaluation with possible breathing test and or further evaluation if hemoptysis returns.  Plan  Patient Instructions  Delsym 2 tsp Twice daily  As needed  Cough  Discuss with primary provider or cardiology that Accupril may aggravate your cough and may need to be changed.  Call back if your bleeding returns .  Follow up in 3 months with Dr. Patsey Berthold and As needed   Please contact office for sooner follow up if symptoms do not improve or worsen or seek emergency care

## 2019-10-29 NOTE — Assessment & Plan Note (Signed)
Hemoptysis x6 months.  Patient had no further episodes over the last 4 weeks.  Patient had extensive work-up through ENT and GI with no etiology noted.  She is on Pradaxa.  Recent pulmonary work-up showed a normal CT chest with no PE or other acute cardiopulmonary etiology.  Symptoms seem to have resolved over the last 4 weeks we will need to continue to monitor.  She is a never smoker. Patient does have an ongoing cough that appears to be chronic.  It is possible this could have caused some upper airway irritation.  May need to consider changing ACE inhibitor  Plan  Patient Instructions  Delsym 2 tsp Twice daily  As needed  Cough  Discuss with primary provider or cardiology that Accupril may aggravate your cough and may need to be changed.  Call back if your bleeding returns .  Follow up in 3 months with Dr. Patsey Berthold and As needed   Please contact office for sooner follow up if symptoms do not improve or worsen or seek emergency care

## 2019-10-29 NOTE — Progress Notes (Signed)
@Patient  ID: Christine Mcneil, female    DOB: April 09, 1966, 54 y.o.   MRN: [id number]  Chief Complaint  Patient presents with  . Follow-up    Hemoptysis     Referring provider: Doughton, Latricia Heft, MD  HPI: 54 year old female lifelong never smoker seen for pulmonary consult Sep 25, 2019 for hemoptysis. Echo history significant for A. Fib, TIA  on Pradaxa, Pseudotumor cerebri , DM ,  Clotting disorder (CMS-HCC)  Prothrombin gene mutation, Anti-cardiolipin antibody positive - IgM  Has Fibromyalgia on chronic pain meds.    TEST/EVENTS :  CBC Sep 22, 2019 normal hemoglobin and platelets  10/29/2019 Follow up : Hemoptysis Patient presents for a 1 month follow-up.  Patient was seen last visit for a pulmonary consult for hemoptysis x6 months.  Patient has a history of A. fib was on Pradaxa.  Prior to consult patient had been seen by ENT and GI with work-up unrevealing for source of hemoptysis.  A CT chest that showed lymph nodes.  Lungs were clear.  No sign of pulmonary embolism.  CT showed no acute cardiopulmonary abnormalities noted. Since last visit patient has had no episodes of hemoptysis .  Has clear sinus drainage , no bloody sinus drainage . Has dry cough each day for long time. Non productive. Not using any otc cough meds.  On ACE inhibitor .   Allergies  Allergen Reactions  . Amoxapine Itching  . Amoxicillin Itching  . Dapagliflozin Itching    Other reaction(s): Other (See Comments) Thrush & vaginal itching  . Keflex [Cephalexin] Itching  . Mirtazapine Other (See Comments)    Pt states felling "like my body is outside of me."    Immunization History  Administered Date(s) Administered  . Influenza,inj,Quad PF,6+ Mos 05/15/2015, 02/15/2018  . Influenza-Unspecified 04/08/2013  . PFIZER SARS-COV-2 Vaccination 05/30/2019, 06/20/2019  . PPD Test 01/30/2019  . Pneumococcal Polysaccharide-23 06/21/2017  . Zoster Recombinat (Shingrix) 04/17/2018, 06/23/2018    Past Medical History:    Diagnosis Date  . Abdominal pain   . Anxiety and depression   . Atypical facial pain   . Bilateral occipital neuralgia   . Cervical spondylosis without myelopathy   . Chronic daily headache   . Chronic pain   . Chronic pelvic pain in female   . Chronic thoracic back pain   . Depression   . Diabetes mellitus without complication ([place])   . Diabetes mellitus, type II ([place])   . Dysphagia   . Dysrhythmia   . Fibromyalgia   . Hyperlipidemia   . Hypertension   . Insomnia   . Iron deficiency anemia   . Left hip pain   . Low back pain   . Migraines   . Numbness   . Optic neuropathy   . OSA (obstructive sleep apnea)   . Polyarthralgia   . Primary osteoarthritis of both hips   . Prothrombin gene mutation ([place])   . Pseudotumor cerebri   . Rectal bleeding   . Right shoulder pain   . Rotator cuff syndrome   . Stroke ([place])   . Thyroid disease   . TIA (transient ischemic attack) 05/27/2017    Tobacco History: Social History   Tobacco Use  Smoking Status Never Smoker  Smokeless Tobacco Never Used   Counseling given: Not Answered   Outpatient Medications Prior to Visit  Medication Sig Dispense Refill  . ACCU-CHEK AVIVA PLUS test strip     . ACCU-CHEK SOFTCLIX LANCETS lancets     . acetaZOLAMIDE (DIAMOX)  125 MG tablet Take 125 mg by mouth daily.     Marland Kitchen AIMOVIG 70 MG/ML SOAJ Inject 70 mg into the skin every 28 (twenty-eight) days.    Marland Kitchen albuterol (VENTOLIN HFA) 108 (90 Base) MCG/ACT inhaler Inhale 2 puffs into the lungs every 6 (six) hours as needed for wheezing or shortness of breath. 1 g 0  . ARIPiprazole (ABILIFY) 5 MG tablet Take 1 tablet (5 mg total) by mouth daily. 90 tablet 0  . aspirin-acetaminophen-caffeine (EXCEDRIN MIGRAINE) 250-250-65 MG tablet Take 2 tablets by mouth every 8 (eight) hours as needed for headache or migraine.     Marland Kitchen atorvastatin (LIPITOR) 80 MG tablet Take 80 mg by mouth daily at 6 PM.    . baclofen (LIORESAL) 10 MG tablet TAKE 1 TABLET BY MOUTH ONCE  DAILY AS NEEDED FOR UP TO 30 DAYS. MAY TAKE 2 TABLETS IF NEEDED    . Blood Glucose Monitoring Suppl (ACCU-CHEK AVIVA PLUS) w/Device KIT     . Carboxymethylcellulose Sod PF (REFRESH PLUS) 0.5 % SOLN Apply to eye.    . Cholecalciferol (VITAMIN D3) 1000 units CAPS Take 1 capsule by mouth daily.    . clotrimazole (LOTRIMIN) 1 % cream     . cyclobenzaprine (FLEXERIL) 5 MG tablet Take 5 mg by mouth 3 (three) times daily as needed.     . DULoxetine (CYMBALTA) 30 MG capsule Take 1 capsule (30 mg total) by mouth daily. To be combined with 60 mg 90 capsule 0  . DULoxetine (CYMBALTA) 60 MG capsule Take 1 capsule (60 mg total) by mouth daily. To be combined with 30 mg 90 capsule 0  . esomeprazole (NEXIUM) 40 MG capsule Take 40 mg by mouth daily.    . ferrous sulfate 325 (65 FE) MG tablet Take by mouth. Take 325 mg daily with breakfast    . fluticasone (FLONASE) 50 MCG/ACT nasal spray Place 2 sprays into both nostrils as needed.     . furosemide (LASIX) 20 MG tablet Take by mouth.    . hydrOXYzine (VISTARIL) 25 MG capsule TAKE (1) CAPSULE BY MOUTH TWICE DAILY ASNEEDED FOR SEVERA ANXIETY ATTACKS 180 capsule 0  . Insulin Degludec (TRESIBA FLEXTOUCH) 200 UNIT/ML SOPN Inject into the skin.    Marland Kitchen lamoTRIgine (LAMICTAL) 100 MG tablet Take 1 tablet (100 mg total) by mouth daily. To be combined with 25 mg 90 tablet 0  . lamoTRIgine (LAMICTAL) 25 MG tablet TAKE ONE TABLET BY MOUTH DAILY. TO BE COMBINED WITH 100 MG. 90 tablet 0  . levothyroxine (SYNTHROID, LEVOTHROID) 100 MCG tablet Take 100 mcg by mouth daily.    Marland Kitchen loratadine (CLARITIN) 10 MG tablet Take 10 mg by mouth daily as needed for allergies.    . Magnesium (V-R MAGNESIUM) 250 MG TABS Take by mouth.    . Magnesium 250 MG TABS Take 1 tablet by mouth daily.     . metFORMIN (GLUCOPHAGE-XR) 500 MG 24 hr tablet Take 500 mg by mouth 2 (two) times daily.     . naloxone (NARCAN) nasal spray 4 mg/0.1 mL 1 spray into nose for opioid overdose. If needed, may repeat spray  in 2-3 min    . NUCYNTA ER 100 MG 12 hr tablet Take 1 tablet by mouth 2 (two) times daily.    Marland Kitchen omeprazole (PRILOSEC) 20 MG capsule TAKE (1) CAPSULE BY MOUTH EVERY DAY 30 capsule 0  . ondansetron (ZOFRAN ODT) 4 MG disintegrating tablet Take 1 tablet (4 mg total) by mouth every 8 (eight) hours as  needed for nausea or vomiting. 12 tablet 0  . ondansetron (ZOFRAN) 4 MG tablet TAKE (1) TABLET BY MOUTH EVERY 8 HOURS AS NEEDED FOR NAUSEA AND VOMITING    . quinapril (ACCUPRIL) 10 MG tablet Take 10 mg by mouth daily.    . Rimegepant Sulfate (NURTEC) 75 MG TBDP Take 1 tablet by mouth as needed.     . traZODone (DESYREL) 100 MG tablet Take 1.5-2 tablets (150-200 mg total) by mouth at bedtime as needed for sleep. 180 tablet 0  . TRESIBA FLEXTOUCH 200 UNIT/ML SOPN Inject 32 Units into the skin at bedtime.     . dabigatran (PRADAXA) 150 MG CAPS capsule Take 150 mg by mouth 2 (two) times daily.    Marland Kitchen diltiazem (CARDIZEM CD) 240 MG 24 hr capsule Take by mouth. Take 1 capsule by mouth once daily    . pioglitazone (ACTOS) 15 MG tablet Take by mouth. Take 1 tablet by mouth once daily     No facility-administered medications prior to visit.     Review of Systems:   Constitutional:   No  weight loss, night sweats,  Fevers, chills, +fatigue, or  lassitude.  HEENT:   No headaches,  Difficulty swallowing,  Tooth/dental problems, or  Sore throat,                No sneezing, itching, ear ache,  +nasal congestion, post nasal drip,   CV:  No chest pain,  Orthopnea, PND, swelling in lower extremities, anasarca, dizziness, palpitations, syncope.   GI  No heartburn, indigestion, abdominal pain, nausea, vomiting, diarrhea, change in bowel habits, loss of appetite, bloody stools.   Resp:   No chest wall deformity  Skin: no rash or lesions.  GU: no dysuria, change in color of urine, no urgency or frequency.  No flank pain, no hematuria   MS:  No joint pain or swelling.  No decreased range of motion.  No back pain.  + FM     Physical Exam  BP 134/74 (BP Location: Left Arm, Cuff Size: Normal)   Pulse 72   Temp 98 F (36.7 C) (Temporal)   Ht 5' 6"  (1.676 m)   Wt 248 lb (112.5 kg)   SpO2 96% Comment: on RA  BMI 40.03 kg/m   GEN: A/Ox3; pleasant , NAD, BMI 40   HEENT:  [place]/AT,    NOSE-clear, THROAT-clear, no lesions, no postnasal drip or exudate noted.   NECK:  Supple w/ fair ROM; no JVD; normal carotid impulses w/o bruits; no thyromegaly or nodules palpated; no lymphadenopathy.    RESP  Clear  P & A; w/o, wheezes/ rales/ or rhonchi. no accessory muscle use, no dullness to percussion  CARD:  RRR, no m/r/g, tr peripheral edema, pulses intact, no cyanosis or clubbing.  GI:   Soft & nt; nml bowel sounds; no organomegaly or masses detected.   Musco: Warm bil, no deformities or joint swelling noted.   Neuro: alert, no focal deficits noted.    Skin: Warm, no lesions or rashes    Lab Results:  CBC   BNP  ProBNP No results found for: PROBNP  Imaging: CT ANGIO CHEST PE W OR WO CONTRAST  Result Date: 10/02/2019 CLINICAL DATA:  Hemoptysis. EXAM: CT ANGIOGRAPHY CHEST WITH CONTRAST TECHNIQUE: Multidetector CT imaging of the chest was performed using the standard protocol during bolus administration of intravenous contrast. Multiplanar CT image reconstructions and MIPs were obtained to evaluate the vascular anatomy. CONTRAST:  114m OMNIPAQUE IOHEXOL 350 MG/ML SOLN  COMPARISON:  December 12, 2016. FINDINGS: Cardiovascular: Satisfactory opacification of the pulmonary arteries to the segmental level. No evidence of pulmonary embolism. Normal heart size. No pericardial effusion. Mediastinum/Nodes: No enlarged mediastinal, hilar, or axillary lymph nodes. Thyroid gland, trachea, and esophagus demonstrate no significant findings. Lungs/Pleura: Lungs are clear. No pleural effusion or pneumothorax. Upper Abdomen: No acute abnormality. Musculoskeletal: No chest wall abnormality. No acute or significant osseous  findings. Review of the MIP images confirms the above findings. IMPRESSION: No definite evidence of pulmonary embolus. No acute cardiopulmonary abnormality seen. Electronically Signed   By: Marijo Conception M.D.   On: 10/02/2019 10:24   DG Abd 2 Views  Result Date: 10/11/2019 CLINICAL DATA:  Swallowed a small-bowel capsule 2 weeks ago. EXAM: ABDOMEN - 2 VIEW COMPARISON:  August 20, 2012 FINDINGS: The bowel gas pattern is normal. There is no evidence of free air. A radiopaque stimulator and associated stimulator wire are seen overlying the mid and upper left abdomen. Radiopaque surgical clips are seen within the right upper quadrant and lateral aspect of the right lower quadrant. No radio-opaque calculi or other significant radiographic abnormality is seen. IMPRESSION: 1. Radiopaque stimulator and associated stimulator wire overlying the mid and upper left abdomen. 2. Evidence of prior cholecystectomy. Electronically Signed   By: Virgina Norfolk M.D.   On: 10/11/2019 23:50      No flowsheet data found.  No results found for: NITRICOXIDE      Assessment & Plan:   Hemoptysis Hemoptysis x6 months.  Patient had no further episodes over the last 4 weeks.  Patient had extensive work-up through ENT and GI with no etiology noted.  She is on Pradaxa.  Recent pulmonary work-up showed a normal CT chest with no PE or other acute cardiopulmonary etiology.  Symptoms seem to have resolved over the last 4 weeks we will need to continue to monitor.  She is a never smoker. Patient does have an ongoing cough that appears to be chronic.  It is possible this could have caused some upper airway irritation.  May need to consider changing ACE inhibitor  Plan  Patient Instructions  Delsym 2 tsp Twice daily  As needed  Cough  Discuss with primary provider or cardiology that Accupril may aggravate your cough and may need to be changed.  Call back if your bleeding returns .  Follow up in 3 months with Dr. Patsey Berthold  and As needed   Please contact office for sooner follow up if symptoms do not improve or worsen or seek emergency care       Chronic cough Patient complains of a daily dry cough.  She does have some postnasal drainage.  Recent CT chest was negative and unrevealing.  Have suggested that she continue on Flonase.  Add in Delsym for cough control. May need to consider changing ACE inhibitor. If symptoms do not improve will need further evaluation with possible breathing test and or further evaluation if hemoptysis returns.  Plan  Patient Instructions  Delsym 2 tsp Twice daily  As needed  Cough  Discuss with primary provider or cardiology that Accupril may aggravate your cough and may need to be changed.  Call back if your bleeding returns .  Follow up in 3 months with Dr. Patsey Berthold and As needed   Please contact office for sooner follow up if symptoms do not improve or worsen or seek emergency care          Rexene Edison, NP 10/29/2019

## 2019-10-30 ENCOUNTER — Encounter: Payer: Self-pay | Admitting: Physical Therapy

## 2019-10-30 ENCOUNTER — Ambulatory Visit: Payer: Medicare Other | Admitting: Physical Therapy

## 2019-10-30 DIAGNOSIS — M436 Torticollis: Secondary | ICD-10-CM

## 2019-10-30 DIAGNOSIS — I700 Atherosclerosis of aorta: Secondary | ICD-10-CM | POA: Insufficient documentation

## 2019-10-30 DIAGNOSIS — R293 Abnormal posture: Secondary | ICD-10-CM

## 2019-10-30 DIAGNOSIS — M542 Cervicalgia: Secondary | ICD-10-CM | POA: Diagnosis not present

## 2019-10-30 NOTE — Therapy (Signed)
[facility] [address] [place], [place], [address] Phone: [phone]   Fax:  [phone]  Physical Therapy Treatment  Patient Details  Name: Christine Mcneil MRN: [id number] Date of Birth: [birth year] Referring Provider (PT): Dr. Manuella Ghazi   Encounter Date: 10/30/2019  PT End of Session - 10/30/19 1519    Visit Number  7    Number of Visits  15    Date for PT Re-Evaluation  11/28/19    Authorization - Visit Number  1    Authorization - Number of Visits  10    PT Start Time  0233    PT Stop Time  0316    PT Time Calculation (min)  43 min    Activity Tolerance  Patient tolerated treatment well    Behavior During Therapy  [facility] for tasks assessed/performed       Past Medical History:  Diagnosis Date  . Abdominal pain   . Anxiety and depression   . Atypical facial pain   . Bilateral occipital neuralgia   . Cervical spondylosis without myelopathy   . Chronic daily headache   . Chronic pain   . Chronic pelvic pain in female   . Chronic thoracic back pain   . Depression   . Diabetes mellitus without complication ([place])   . Diabetes mellitus, type II ([place])   . Dysphagia   . Dysrhythmia   . Fibromyalgia   . Hyperlipidemia   . Hypertension   . Insomnia   . Iron deficiency anemia   . Left hip pain   . Low back pain   . Migraines   . Numbness   . Optic neuropathy   . OSA (obstructive sleep apnea)   . Polyarthralgia   . Primary osteoarthritis of both hips   . Prothrombin gene mutation ([place])   . Pseudotumor cerebri   . Rectal bleeding   . Right shoulder pain   . Rotator cuff syndrome   . Stroke ([place])   . Thyroid disease   . TIA (transient ischemic attack) 05/27/2017    Past Surgical History:  Procedure Laterality Date  . ABDOMINAL HYSTERECTOMY     total  . CHOLECYSTECTOMY    . COLONOSCOPY WITH PROPOFOL N/A 03/21/2018   Procedure: COLONOSCOPY WITH PROPOFOL;  Surgeon: Lucilla Lame, MD;  Location: [facility] ENDOSCOPY;  Service:  Endoscopy;  Laterality: N/A;  . ESOPHAGOGASTRODUODENOSCOPY (EGD) WITH PROPOFOL N/A 03/21/2018   Procedure: ESOPHAGOGASTRODUODENOSCOPY (EGD) WITH PROPOFOL;  Surgeon: Lucilla Lame, MD;  Location: [facility] ENDOSCOPY;  Service: Endoscopy;  Laterality: N/A;  . GIVENS CAPSULE STUDY N/A 09/24/2019   Procedure: GIVENS CAPSULE STUDY;  Surgeon: Virgel Manifold, MD;  Location: [facility] ENDOSCOPY;  Service: Endoscopy;  Laterality: N/A;  . JOINT REPLACEMENT    . KNEE SURGERY Left   . ROTATOR CUFF REPAIR Right   . spinal neurostimulator      There were no vitals filed for this visit.  Subjective Assessment - 10/30/19 1516    Subjective  Pt. reported having a slight headache at start of session, resolved after STM.  Pt. reports compliance with HEP.    Pertinent History  She has a spinal stimulator for her occipital nerve since 2014.  She had this done for occiptal headaches.    Limitations  Sitting    Patient Stated Goals  to be able to turn her head to look behind her and to be able to sleep without being limited by neck pain.    Currently in  Pain?  Yes   no subjective pain give, headache reported   Pain Onset  More than a month ago         [facility] PT Assessment - 10/30/19 0001      Assessment   Medical Diagnosis  Neck pain    Referring Provider (PT)  Dr. Manuella Ghazi    Onset Date/Surgical Date  05/25/19      Prior Function   Level of Independence  Independent      Cognition   Overall Cognitive Status  Within Functional Limits for tasks assessed        Therex.:   Rows with GTB: 10x reps Reviewed HEP  Manual tx.:  STM cervical muscles and trapezius (decrease pain) Cervical sidebending passive stretch.  Suboccipital release: 5 mins Bilateral grades 1-4 unilateral rotational thoracic mobs (T4-T12): 10 mins Hypervolt bilateral upper trap. In prone position: 3 minutes      PT Short Term Goals - 10/30/19 1715      PT SHORT TERM GOAL #1   Title  Pt will improve cervical AROM rotation L x 5  degrees    Baseline  L 40, R 50, 10/10/19: L rot 50, R 55    Time  4    Period  Weeks    Status  Partially Met    Target Date  11/27/19      PT SHORT TERM GOAL #2   Title  pt will demonstrate sitting/sleeping posture that promote optimal cervical spine mechanics and pain <2/10    Baseline  pt lacks awareness    Time  4    Period  Weeks    Status  Partially Met    Target Date  11/27/19        PT Long Term Goals - 10/30/19 1716      PT LONG TERM GOAL #1   Title  Pt will be independent with HEP for postural mm/UE/neck strength    Baseline  unable to perform scap retraction    Time  6    Period  Weeks    Status  Achieved    Target Date  10/30/19      PT LONG TERM GOAL #2   Title  Pt. will increase FOTO from 54 to 70 to improve functional mobility.    Baseline  54    Time  4    Period  Weeks    Status  On-going    Target Date  11/27/19      PT LONG TERM GOAL #3   Title  Pt will improve L cervical spine AROM rotation >10 deg to facilitate being able to turn head while driving    Baseline  R 40 deg    Time  4    Period  Weeks    Status  Partially Met    Target Date  11/27/19         Plan - 10/30/19 1537    Clinical Impression Statement  Pt. continues to present with moderate forward head/ thoracic kyphotic posture with headaches.  (+) L-sided  UT/neck pain with left cervical rotation.  Pt. had trigger points in bilateral trapezius and cervical paraspinals. Pt responded well to right thoracic unilateral rotational mobilizations. Pt responded well to Hypervolt on bilateral trapezius muscles with marked decrease in HA reported.  Pt. will continue to benefit from skilled PT services to increase upright posture/ cervical AROM to improve pain-free mobility/ headaches.    Stability/Clinical Decision Making  Stable/Uncomplicated    Clinical  Decision Making  Low    Rehab Potential  Good    PT Frequency  2x / week    PT Duration  4 weeks    PT Treatment/Interventions  ADLs/Self  Care Home Management;Cryotherapy;Electrical Stimulation;Traction;Moist Heat;Functional mobility training;Therapeutic activities;Therapeutic exercise;Neuromuscular re-education;Manual techniques;Patient/family education;Passive range of motion    PT Next Visit Plan  thoracic mobilizations and mobility, increase core and deep neck flexor strength.    PT Home Exercise Plan  Access Code: [email or web address]       Patient will benefit from skilled therapeutic intervention in order to improve the following deficits and impairments:  Improper body mechanics, Pain, Postural dysfunction, Decreased activity tolerance, Decreased endurance, Decreased range of motion, Decreased strength, Impaired flexibility, Decreased safety awareness, Obesity, Impaired UE functional use, Increased muscle spasms  Visit Diagnosis: Neck pain  Neck stiffness  Abnormal posture     Problem List Patient Active Problem List   Diagnosis Date Noted  . Aortic atherosclerosis ([place]) 10/30/2019  . Hemoptysis 10/29/2019  . Chronic cough 10/29/2019  . MDD (major depressive disorder), recurrent, in full remission ([place]) 09/10/2019  . Peripheral edema 06/28/2019  . Paroxysmal atrial fibrillation ([place]) 06/28/2019  . Episodic tension-type headache, not intractable 06/13/2019  . Atypical angina ([place]) 06/06/2019  . Lumbar radiculopathy 02/21/2019  . MDD (major depressive disorder), recurrent episode, moderate ([place]) 11/28/2018  . PTSD (post-traumatic stress disorder) 11/28/2018  . GAD (generalized anxiety disorder) 11/28/2018  . Insomnia due to mental disorder 11/28/2018  . Severe obesity (BMI >= 40) ([place]) 11/27/2018  . Polyp of sigmoid colon   . Iron deficiency anemia 01/19/2018  . 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 TIA (transient ischemic attack) 06/20/2017  . Primary osteoarthritis of both hips 06/20/2017  . Periodic limb movements of sleep 05/30/2017  . Shortness of breath 01/03/2017  . Cerebral venous  sinus thrombosis 10/12/2016  . Chronic pelvic pain in female 05/28/2016  . Migraine without aura and without status migrainosus, not intractable 05/11/2016  . Chronic anticoagulation 03/29/2016  . Encounter for monitoring opioid maintenance therapy 11/04/2015  . Dysphagia, neurologic 09/16/2015  . Numbness 09/16/2015  . Anti-cardiolipin antibody positive 09/01/2015  . Prothrombin gene mutation ([place]) 09/01/2015  . H/O: CVA (cerebrovascular accident) 06/27/2015  . Anemia, iron deficiency 10/02/2014  . Chronic thoracic back pain 07/24/2014  . Bilateral occipital neuralgia 04/11/2014  . Hypothyroidism, unspecified 10/17/2013  . Type 2 diabetes, HbA1c goal < 7% (HCC) 10/17/2013  . Cervicogenic headache 07/04/2013  . Cervical spondylosis without myelopathy 11/16/2012  . Rotator cuff syndrome 11/13/2012  . Sinus congestion 11/07/2012  . Abdominal pain 09/28/2012  . Rectal bleeding 09/28/2012  . Depression 02/22/2012  . GERD (gastroesophageal reflux disease) 02/22/2012  . Essential hypertension 02/22/2012  . Obesity, unspecified 02/22/2012  . Neuromuscular disorder ([place]) 02/22/2012  . Pain in joint, pelvic region and thigh 01/17/2012  . Insomnia secondary to chronic pain 10/04/2011  . Right shoulder pain 10/04/2011  . Atypical facial pain 10/03/2011  . Chronic daily headache 10/03/2011  . Fibromyalgia 10/03/2011  . Hyperlipidemia, unspecified 10/03/2011   Pura Spice, PT, DPT # [phone] 10/30/2019, 5:18 PM  [facility] [address] [place], [place], [address] Phone: [phone]   Fax:  [phone]  Name: Christine Mcneil MRN: [id number] Date of Birth: [birth year]

## 2019-10-31 ENCOUNTER — Other Ambulatory Visit: Payer: Self-pay

## 2019-10-31 ENCOUNTER — Encounter: Payer: Self-pay | Admitting: Gastroenterology

## 2019-10-31 ENCOUNTER — Ambulatory Visit (INDEPENDENT_AMBULATORY_CARE_PROVIDER_SITE_OTHER): Payer: Medicare Other | Admitting: Gastroenterology

## 2019-10-31 VITALS — BP 124/80 | HR 85 | Temp 97.8°F | Wt 251.4 lb

## 2019-10-31 DIAGNOSIS — R748 Abnormal levels of other serum enzymes: Secondary | ICD-10-CM

## 2019-10-31 NOTE — Progress Notes (Signed)
[name], MD [address]  [place]  [place], [place] [address]  Main: [phone]  Fax: [phone]   Primary Care Physician: Doughton, Latricia Heft, MD   Chief Complaint  Patient presents with  . Follow-up  . Abdominal pain    Patient stated that she had been doing well.  . Iron deficiency anemia    Patient stated that she believs that her iron might be low since she had been feeling tired and fatigued.    HPI: Christine Mcneil is a 54 y.o. female here for follow-up of iron deficiency anemia.  The pill camera study was inconclusive as capsule did not enter the small bowel.  Stomach was full of food.    Previous work-up for this included EGD and colonoscopy in 2019 which was unrevealing. She has never had a pill camera study.  Denies any diarrhea, blood in stool, melena, hematochezia, abdominal pain, nausea or vomiting.  Latest CBC was normal, with no anemia.  Patient following up with hematology  Patient was recently in the ER for abdominal pain and incidentally found to have elevated liver enzymes and was started on a new medication for diabetes, Ozempic at that time.  She has discontinued this since then.  No further abdominal pain at this time.  Current Outpatient Medications  Medication Sig Dispense Refill  . ACCU-CHEK AVIVA PLUS test strip     . ACCU-CHEK SOFTCLIX LANCETS lancets     . acetaZOLAMIDE (DIAMOX) 125 MG tablet Take 125 mg by mouth daily.     Marland Kitchen AIMOVIG 70 MG/ML SOAJ Inject 70 mg into the skin every 28 (twenty-eight) days.    Marland Kitchen albuterol (VENTOLIN HFA) 108 (90 Base) MCG/ACT inhaler Inhale 2 puffs into the lungs every 6 (six) hours as needed for wheezing or shortness of breath. 1 g 0  . ARIPiprazole (ABILIFY) 5 MG tablet Take 1 tablet (5 mg total) by mouth daily. 90 tablet 0  . aspirin-acetaminophen-caffeine (EXCEDRIN MIGRAINE) 250-250-65 MG tablet Take 2 tablets by mouth every 8 (eight) hours as needed for headache or migraine.     Marland Kitchen atorvastatin  (LIPITOR) 80 MG tablet Take 80 mg by mouth daily at 6 PM.    . baclofen (LIORESAL) 10 MG tablet TAKE 1 TABLET BY MOUTH ONCE DAILY AS NEEDED FOR UP TO 30 DAYS. MAY TAKE 2 TABLETS IF NEEDED    . Blood Glucose Monitoring Suppl (ACCU-CHEK AVIVA PLUS) w/Device KIT     . Carboxymethylcellulose Sod PF (REFRESH PLUS) 0.5 % SOLN Apply to eye.    . Cholecalciferol (VITAMIN D3) 1000 units CAPS Take 1 capsule by mouth daily.    . clotrimazole (LOTRIMIN) 1 % cream     . cyclobenzaprine (FLEXERIL) 5 MG tablet Take 5 mg by mouth 3 (three) times daily as needed.     . dabigatran (PRADAXA) 150 MG CAPS capsule Take 150 mg by mouth 2 (two) times daily.    Marland Kitchen diltiazem (CARDIZEM CD) 240 MG 24 hr capsule Take by mouth. Take 1 capsule by mouth once daily    . DULoxetine (CYMBALTA) 30 MG capsule Take 1 capsule (30 mg total) by mouth daily. To be combined with 60 mg 90 capsule 0  . DULoxetine (CYMBALTA) 60 MG capsule Take 1 capsule (60 mg total) by mouth daily. To be combined with 30 mg 90 capsule 0  . esomeprazole (NEXIUM) 40 MG capsule Take 40 mg by mouth daily.    . ferrous sulfate 325 (65 FE) MG tablet Take  by mouth. Take 325 mg daily with breakfast    . fluticasone (FLONASE) 50 MCG/ACT nasal spray Place 2 sprays into both nostrils as needed.     . furosemide (LASIX) 20 MG tablet Take by mouth.    . hydrOXYzine (VISTARIL) 25 MG capsule TAKE (1) CAPSULE BY MOUTH TWICE DAILY ASNEEDED FOR SEVERA ANXIETY ATTACKS 180 capsule 0  . Insulin Degludec (TRESIBA FLEXTOUCH) 200 UNIT/ML SOPN Inject into the skin.    Marland Kitchen lamoTRIgine (LAMICTAL) 100 MG tablet Take 1 tablet (100 mg total) by mouth daily. To be combined with 25 mg 90 tablet 0  . lamoTRIgine (LAMICTAL) 25 MG tablet TAKE ONE TABLET BY MOUTH DAILY. TO BE COMBINED WITH 100 MG. 90 tablet 0  . levothyroxine (SYNTHROID, LEVOTHROID) 100 MCG tablet Take 100 mcg by mouth daily.    Marland Kitchen loratadine (CLARITIN) 10 MG tablet Take 10 mg by mouth daily as needed for allergies.    .  Magnesium (V-R MAGNESIUM) 250 MG TABS Take by mouth.    . Magnesium 250 MG TABS Take 1 tablet by mouth daily.     . metFORMIN (GLUCOPHAGE-XR) 500 MG 24 hr tablet Take 500 mg by mouth 2 (two) times daily.     . naloxone (NARCAN) nasal spray 4 mg/0.1 mL 1 spray into nose for opioid overdose. If needed, may repeat spray in 2-3 min    . NUCYNTA ER 100 MG 12 hr tablet Take 1 tablet by mouth 2 (two) times daily.    Marland Kitchen omeprazole (PRILOSEC) 20 MG capsule TAKE (1) CAPSULE BY MOUTH EVERY DAY 30 capsule 0  . ondansetron (ZOFRAN ODT) 4 MG disintegrating tablet Take 1 tablet (4 mg total) by mouth every 8 (eight) hours as needed for nausea or vomiting. 12 tablet 0  . ondansetron (ZOFRAN) 4 MG tablet TAKE (1) TABLET BY MOUTH EVERY 8 HOURS AS NEEDED FOR NAUSEA AND VOMITING    . quinapril (ACCUPRIL) 10 MG tablet Take 10 mg by mouth daily.    . Rimegepant Sulfate (NURTEC) 75 MG TBDP Take 1 tablet by mouth as needed.     . traZODone (DESYREL) 100 MG tablet Take 1.5-2 tablets (150-200 mg total) by mouth at bedtime as needed for sleep. 180 tablet 0  . TRESIBA FLEXTOUCH 200 UNIT/ML SOPN Inject 32 Units into the skin at bedtime.     . pioglitazone (ACTOS) 15 MG tablet Take by mouth. Take 1 tablet by mouth once daily     No current facility-administered medications for this visit.    Allergies as of 10/31/2019 - Review Complete 10/31/2019  Allergen Reaction Noted  . Amoxapine Itching 09/16/2015  . Amoxicillin Itching   . Dapagliflozin Itching 11/07/2017  . Keflex [cephalexin] Itching 09/16/2015  . Mirtazapine Other (See Comments) 06/08/2019    ROS:  General: Negative for anorexia, weight loss, fever, chills, fatigue, weakness. ENT: Negative for hoarseness, difficulty swallowing , nasal congestion. CV: Negative for chest pain, angina, palpitations, dyspnea on exertion, peripheral edema.  Respiratory: Negative for dyspnea at rest, dyspnea on exertion, cough, sputum, wheezing.  GI: See history of present  illness. GU:  Negative for dysuria, hematuria, urinary incontinence, urinary frequency, nocturnal urination.  Endo: Negative for unusual weight change.    Physical Examination:   BP 124/80   Pulse 85   Temp 97.8 F (36.6 C) (Oral)   Wt 251 lb 6.4 oz (114 kg)   BMI 40.58 kg/m   General: Well-nourished, well-developed in no acute distress.  Eyes: No icterus. Conjunctivae pink. Mouth: Oropharyngeal  mucosa moist and pink , no lesions erythema or exudate. Neck: Supple, Trachea midline Abdomen: Bowel sounds are normal, nontender, nondistended, no hepatosplenomegaly or masses, no abdominal bruits or hernia , no rebound or guarding.   Extremities: No lower extremity edema. No clubbing or deformities. Neuro: Alert and oriented x 3.  Grossly intact. Skin: Warm and dry, no jaundice.   Psych: Alert and cooperative, normal mood and affect.   Labs: CMP     Component Value Date/Time   NA 138 09/22/2019 2251   NA 138 03/22/2014 1659   K 4.0 09/22/2019 2251   K 3.7 03/22/2014 1659   CL 100 09/22/2019 2251   CL 104 03/22/2014 1659   CO2 26 09/22/2019 2251   CO2 23 03/22/2014 1659   GLUCOSE 141 (H) 09/22/2019 2251   GLUCOSE 156 (H) 03/22/2014 1659   BUN 10 09/25/2019 1015   BUN 7 03/22/2014 1659   CREATININE 1.02 (H) 09/25/2019 1015   CREATININE 0.98 03/22/2014 1659   CALCIUM 10.3 09/22/2019 2251   CALCIUM 9.0 03/22/2014 1659   PROT 8.1 09/22/2019 2251   PROT 8.8 (H) 03/22/2014 1659   ALBUMIN 4.4 09/22/2019 2251   ALBUMIN 4.5 03/22/2014 1659   AST 69 (H) 09/22/2019 2251   AST 34 03/22/2014 1659   ALT 73 (H) 09/22/2019 2251   ALT 37 03/22/2014 1659   ALKPHOS 168 (H) 09/22/2019 2251   ALKPHOS 147 (H) 03/22/2014 1659   BILITOT 0.6 09/22/2019 2251   BILITOT 0.3 03/22/2014 1659   GFRNONAA >60 09/25/2019 1015   GFRNONAA >60 03/22/2014 1659   GFRNONAA >60 09/15/2013 0131   GFRAA >60 09/25/2019 1015   GFRAA >60 03/22/2014 1659   GFRAA >60 09/15/2013 0131   Lab Results  Component  Value Date   WBC 8.6 09/22/2019   HGB 14.5 09/22/2019   HCT 43.7 09/22/2019   MCV 87.4 09/22/2019   PLT 249 09/22/2019    Imaging Studies: CT ANGIO CHEST PE W OR WO CONTRAST  Result Date: 10/02/2019 CLINICAL DATA:  Hemoptysis. EXAM: CT ANGIOGRAPHY CHEST WITH CONTRAST TECHNIQUE: Multidetector CT imaging of the chest was performed using the standard protocol during bolus administration of intravenous contrast. Multiplanar CT image reconstructions and MIPs were obtained to evaluate the vascular anatomy. CONTRAST:  165m OMNIPAQUE IOHEXOL 350 MG/ML SOLN COMPARISON:  December 12, 2016. FINDINGS: Cardiovascular: Satisfactory opacification of the pulmonary arteries to the segmental level. No evidence of pulmonary embolism. Normal heart size. No pericardial effusion. Mediastinum/Nodes: No enlarged mediastinal, hilar, or axillary lymph nodes. Thyroid gland, trachea, and esophagus demonstrate no significant findings. Lungs/Pleura: Lungs are clear. No pleural effusion or pneumothorax. Upper Abdomen: No acute abnormality. Musculoskeletal: No chest wall abnormality. No acute or significant osseous findings. Review of the MIP images confirms the above findings. IMPRESSION: No definite evidence of pulmonary embolus. No acute cardiopulmonary abnormality seen. Electronically Signed   By: JMarijo ConceptionM.D.   On: 10/02/2019 10:24   DG Abd 2 Views  Result Date: 10/11/2019 CLINICAL DATA:  Swallowed a small-bowel capsule 2 weeks ago. EXAM: ABDOMEN - 2 VIEW COMPARISON:  August 20, 2012 FINDINGS: The bowel gas pattern is normal. There is no evidence of free air. A radiopaque stimulator and associated stimulator wire are seen overlying the mid and upper left abdomen. Radiopaque surgical clips are seen within the right upper quadrant and lateral aspect of the right lower quadrant. No radio-opaque calculi or other significant radiographic abnormality is seen. IMPRESSION: 1. Radiopaque stimulator and associated stimulator wire  overlying  the mid and upper left abdomen. 2. Evidence of prior cholecystectomy. Electronically Signed   By: Virgina Norfolk M.D.   On: 10/11/2019 23:50    Assessment and Plan:   EURETHA NAJARRO is a 54 y.o. y/o female here for follow-up of iron deficiency anemia with an inconclusive pill camera study  Repeat liver enzymes due to recent elevation Further work-up based on results  Anemia resolved at this time  Continue to monitor and can consider repeat pill camera study or repeat procedures if anemia reoccurs  Continue follow-up with hematology  Dr [name]

## 2019-11-01 LAB — HEPATIC FUNCTION PANEL
ALT: 47 IU/L — ABNORMAL HIGH (ref 0–32)
AST: 42 IU/L — ABNORMAL HIGH (ref 0–40)
Albumin: 4.4 g/dL (ref 3.8–4.9)
Alkaline Phosphatase: 143 IU/L — ABNORMAL HIGH (ref 48–121)
Bilirubin Total: 0.3 mg/dL (ref 0.0–1.2)
Bilirubin, Direct: 0.1 mg/dL (ref 0.00–0.40)
Total Protein: 7.4 g/dL (ref 6.0–8.5)

## 2019-11-05 NOTE — Addendum Note (Signed)
Addended by: [name] on: 11/05/2019 02:44 PM   Modules accepted: Orders

## 2019-11-06 ENCOUNTER — Ambulatory Visit: Payer: Medicare Other | Admitting: Physical Therapy

## 2019-11-06 ENCOUNTER — Other Ambulatory Visit: Payer: Self-pay

## 2019-11-06 ENCOUNTER — Encounter: Payer: Self-pay | Admitting: Physical Therapy

## 2019-11-06 DIAGNOSIS — R293 Abnormal posture: Secondary | ICD-10-CM

## 2019-11-06 DIAGNOSIS — M6281 Muscle weakness (generalized): Secondary | ICD-10-CM

## 2019-11-06 DIAGNOSIS — M436 Torticollis: Secondary | ICD-10-CM

## 2019-11-06 DIAGNOSIS — M542 Cervicalgia: Secondary | ICD-10-CM

## 2019-11-06 NOTE — Therapy (Signed)
[facility] [address] [place], [place], [address] Phone: [phone]   Fax:  [phone]  Physical Therapy Treatment  Patient Details  Name: Christine Mcneil MRN: [id number] Date of Birth: 15-Nov-1965 Referring Provider (PT): Dr. Manuella Ghazi   Encounter Date: 11/06/2019   PT End of Session - 11/06/19 1615    Visit Number 8    Number of Visits 15    Date for PT Re-Evaluation 11/28/19    Authorization - Visit Number 2    Authorization - Number of Visits 10    Progress Note Due on Visit --    PT Start Time 7893    PT Stop Time 1532    PT Time Calculation (min) 47 min    Activity Tolerance Patient tolerated treatment well    Behavior During Therapy [facility] for tasks assessed/performed           Past Medical History:  Diagnosis Date  . Abdominal pain   . Anxiety and depression   . Atypical facial pain   . Bilateral occipital neuralgia   . Cervical spondylosis without myelopathy   . Chronic daily headache   . Chronic pain   . Chronic pelvic pain in female   . Chronic thoracic back pain   . Depression   . Diabetes mellitus without complication ([place])   . Diabetes mellitus, type II ([place])   . Dysphagia   . Dysrhythmia   . Fibromyalgia   . Hyperlipidemia   . Hypertension   . Insomnia   . Iron deficiency anemia   . Left hip pain   . Low back pain   . Migraines   . Numbness   . Optic neuropathy   . OSA (obstructive sleep apnea)   . Polyarthralgia   . Primary osteoarthritis of both hips   . Prothrombin gene mutation ([place])   . Pseudotumor cerebri   . Rectal bleeding   . Right shoulder pain   . Rotator cuff syndrome   . Stroke ([place])   . Thyroid disease   . TIA (transient ischemic attack) 05/27/2017    Past Surgical History:  Procedure Laterality Date  . ABDOMINAL HYSTERECTOMY     total  . CHOLECYSTECTOMY    . COLONOSCOPY WITH PROPOFOL N/A 03/21/2018   Procedure: COLONOSCOPY WITH PROPOFOL;  Surgeon: Lucilla Lame, MD;   Location: [facility] ENDOSCOPY;  Service: Endoscopy;  Laterality: N/A;  . ESOPHAGOGASTRODUODENOSCOPY (EGD) WITH PROPOFOL N/A 03/21/2018   Procedure: ESOPHAGOGASTRODUODENOSCOPY (EGD) WITH PROPOFOL;  Surgeon: Lucilla Lame, MD;  Location: [facility] ENDOSCOPY;  Service: Endoscopy;  Laterality: N/A;  . GIVENS CAPSULE STUDY N/A 09/24/2019   Procedure: GIVENS CAPSULE STUDY;  Surgeon: Virgel Manifold, MD;  Location: [facility] ENDOSCOPY;  Service: Endoscopy;  Laterality: N/A;  . JOINT REPLACEMENT    . KNEE SURGERY Left   . ROTATOR CUFF REPAIR Right   . spinal neurostimulator      There were no vitals filed for this visit.   Subjective Assessment - 11/06/19 1612    Subjective Pt. reports neck is feeling a lot better, however left sciatic pain has been worsening. She will be seeing a neuro MD in 2 weeks.    Pertinent History She has a spinal stimulator for her occipital nerve since 2014.  She had this done for occiptal headaches.    Limitations Sitting    Patient Stated Goals to be able to turn her head to look behind her and to be able to sleep without being limited by  neck pain.    Currently in Pain? No/denies    Pain Onset More than a month ago            Therex:  Standing scap retractions with GTB: 15x  Standing shoulder ext with scap retraction with GTB:15x Standing thoracic extension on theraball on wall: 10x 3 second holds R Pec Door stretch: 3x15 sec holds  Manual:   STM to L low back  STM to L trap and cervical musculature.     Discussed HEP in depth.          PT Short Term Goals - 11/06/19 1653      PT SHORT TERM GOAL #1   Title Pt will improve cervical AROM rotation L x 5 degrees    Baseline L 40, R 50, 10/10/19: L rot 50, R 55    Time 4    Period Weeks    Status Partially Met    Target Date 11/06/19      PT SHORT TERM GOAL #2   Title pt will demonstrate sitting/sleeping posture that promote optimal cervical spine mechanics and pain <2/10    Baseline marked improvement     Time 4    Period Weeks    Status Achieved    Target Date 11/06/19             PT Long Term Goals - 11/06/19 1654      PT LONG TERM GOAL #1   Title Pt will be independent with HEP for postural mm/UE/neck strength    Baseline unable to perform scap retraction    Time 6    Period Weeks    Status Achieved    Target Date 10/30/19      PT LONG TERM GOAL #2   Title Pt. will increase FOTO from 54 to 70 to improve functional mobility.    Baseline 54.  6/15:  68    Time 4    Period Weeks    Status Achieved    Target Date 11/06/19      PT LONG TERM GOAL #3   Title Pt will improve L cervical spine AROM rotation >10 deg to facilitate being able to turn head while driving    Baseline R 40 deg    Time 4    Period Weeks    Status Partially Met    Target Date 11/06/19              Plan - 11/06/19 1636    Clinical Impression Statement Pt. presents to therapy with moderate forward head/thoracic kyphotic posture, however has expressed resolution of headaches. Pt. did well with postural exercises, and felt better after doing thoracic extension on a ball on the wall. Pt. still expresses some discomfort in her neck with L rotation, but it is much less. Pt. has many trigger points in L lumbar musculature, as well as L cervical paraspinals and trapezius. Pt. will contact PT if symptoms regress.  Probable discharge from PT at this time.    Stability/Clinical Decision Making Stable/Uncomplicated    Clinical Decision Making Low    Rehab Potential Good    PT Frequency 2x / week    PT Duration 4 weeks    PT Treatment/Interventions ADLs/Self Care Home Management;Cryotherapy;Electrical Stimulation;Traction;Moist Heat;Functional mobility training;Therapeutic activities;Therapeutic exercise;Neuromuscular re-education;Manual techniques;Patient/family education;Passive range of motion    PT Next Visit Plan Discharge visit.  Pt. will contact PT if any further issues.    PT Home Exercise Plan Access  Code: 7CHYIFOY  Patient will benefit from skilled therapeutic intervention in order to improve the following deficits and impairments:  Improper body mechanics, Pain, Postural dysfunction, Decreased activity tolerance, Decreased endurance, Decreased range of motion, Decreased strength, Impaired flexibility, Decreased safety awareness, Obesity, Impaired UE functional use, Increased muscle spasms  Visit Diagnosis: Neck pain  Neck stiffness  Abnormal posture  Muscle weakness (generalized)     Problem List Patient Active Problem List   Diagnosis Date Noted  . Aortic atherosclerosis ([place]) 10/30/2019  . Hemoptysis 10/29/2019  . Chronic cough 10/29/2019  . MDD (major depressive disorder), recurrent, in full remission ([place]) 09/10/2019  . Peripheral edema 06/28/2019  . Paroxysmal atrial fibrillation ([place]) 06/28/2019  . Episodic tension-type headache, not intractable 06/13/2019  . Atypical angina ([place]) 06/06/2019  . Lumbar radiculopathy 02/21/2019  . MDD (major depressive disorder), recurrent episode, moderate ([place]) 11/28/2018  . PTSD (post-traumatic stress disorder) 11/28/2018  . GAD (generalized anxiety disorder) 11/28/2018  . Insomnia due to mental disorder 11/28/2018  . Severe obesity (BMI >= 40) ([place]) 11/27/2018  . Polyp of sigmoid colon   . Iron deficiency anemia 01/19/2018  . 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 TIA (transient ischemic attack) 06/20/2017  . Primary osteoarthritis of both hips 06/20/2017  . Periodic limb movements of sleep 05/30/2017  . Shortness of breath 01/03/2017  . 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 Chronic pelvic pain in female 05/28/2016  . Migraine without aura and without status migrainosus, not intractable 05/11/2016  . Chronic anticoagulation 03/29/2016  . Encounter for monitoring opioid maintenance therapy 11/04/2015  . Dysphagia, neurologic 09/16/2015  . Numbness 09/16/2015  . Anti-cardiolipin  antibody positive 09/01/2015  . 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 H/O: CVA (cerebrovascular accident) 06/27/2015  . Anemia, iron deficiency 10/02/2014  . Chronic thoracic back pain 07/24/2014  . Bilateral occipital neuralgia 04/11/2014  . Hypothyroidism, unspecified 10/17/2013  . Type 2 diabetes, HbA1c goal < 7% ([facility]) 10/17/2013  . Cervicogenic headache 07/04/2013  . Cervical spondylosis without myelopathy 11/16/2012  . Rotator cuff syndrome 11/13/2012  . Sinus congestion 11/07/2012  . Abdominal pain 09/28/2012  . Rectal bleeding 09/28/2012  . Depression 02/22/2012  . GERD (gastroesophageal reflux disease) 02/22/2012  . Essential hypertension 02/22/2012  . Obesity, unspecified 02/22/2012  . Neuromuscular disorder ([place]) 02/22/2012  . Pain in joint, pelvic region and thigh 01/17/2012  . Insomnia secondary to chronic pain 10/04/2011  . Right shoulder pain 10/04/2011  . Atypical facial pain 10/03/2011  . Chronic daily headache 10/03/2011  . Fibromyalgia 10/03/2011  . Hyperlipidemia, unspecified 10/03/2011   Pura Spice, PT, DPT # [id number], SPT 11/06/2019, 4:55 PM  [place] [facility] [facility] [address] [place], [place], [address] Phone: [phone]   Fax:  [phone]  Name: Christine Mcneil MRN: [id number] Date of Birth: 1965/06/02

## 2019-11-07 ENCOUNTER — Telehealth: Payer: Self-pay

## 2019-11-07 DIAGNOSIS — F4310 Post-traumatic stress disorder, unspecified: Secondary | ICD-10-CM

## 2019-11-07 DIAGNOSIS — F3341 Major depressive disorder, recurrent, in partial remission: Secondary | ICD-10-CM

## 2019-11-07 NOTE — Telephone Encounter (Signed)
Received a fax requesting a refill on abilify   ARIPiprazole (ABILIFY) 5 MG tablet Medication Date:  07/10/2019 Department: [facility] Ordering/Authorizing: Ursula Alert, MD  Order Providers  Prescribing Provider Encounter Provider  Ursula Alert, MD Ursula Alert, MD  Outpatient Medication Detail   Disp Refills Start End   ARIPiprazole (ABILIFY) 5 MG tablet 90 tablet 0 07/10/2019    Sig - Route: Take 1 tablet (5 mg total) by mouth daily. - Oral   Sent to pharmacy as: ARIPiprazole (ABILIFY) 5 MG tablet   E-Prescribing Status: Receipt confirmed by pharmacy (07/10/2019 4:40 PM EST)

## 2019-11-07 NOTE — Telephone Encounter (Signed)
I don't know if she is supposed to continue Abilify since I dont see recommendation to continue Abilify in Dr. Charlcie Cradle last note. Dr. Shea Evans will be back and I will have her take a look and refill accordingly.

## 2019-11-08 ENCOUNTER — Telehealth: Payer: Self-pay

## 2019-11-08 ENCOUNTER — Other Ambulatory Visit: Payer: Self-pay

## 2019-11-08 ENCOUNTER — Other Ambulatory Visit
Admission: RE | Admit: 2019-11-08 | Discharge: 2019-11-08 | Disposition: A | Discharge status: Home / Self Care | Payer: Medicare Other | Attending: Gastroenterology | Admitting: Gastroenterology

## 2019-11-08 DIAGNOSIS — R748 Abnormal levels of other serum enzymes: Secondary | ICD-10-CM

## 2019-11-08 LAB — HEPATIC FUNCTION PANEL
ALT: 44 U/L (ref 0–44)
AST: 41 U/L (ref 15–41)
Albumin: 4.1 g/dL (ref 3.5–5.0)
Alkaline Phosphatase: 98 U/L (ref 38–126)
Bilirubin, Direct: <0.1 mg/dL (ref 0.0–0.2)
Total Bilirubin: 0.2 mg/dL — ABNORMAL LOW (ref 0.3–1.2)
Total Protein: 7.5 g/dL (ref 6.5–8.1)

## 2019-11-08 MED ORDER — DULOXETINE HCL 30 MG PO CPEP: 30.0000 mg | ORAL_CAPSULE | Freq: Every day | ORAL | 0 refills | Status: DC

## 2019-11-08 MED ORDER — DULOXETINE HCL 60 MG PO CPEP: 60.0000 mg | ORAL_CAPSULE | Freq: Every day | ORAL | 0 refills | Status: DC

## 2019-11-08 MED ORDER — TRAZODONE HCL 100 MG PO TABS: 150.0000 mg | ORAL_TABLET | Freq: Every evening | ORAL | 0 refills | Status: DC | PRN

## 2019-11-08 MED ORDER — ARIPIPRAZOLE 5 MG PO TABS: 5.0000 mg | ORAL_TABLET | Freq: Every day | ORAL | 1 refills | Status: DC

## 2019-11-08 NOTE — Progress Notes (Signed)
Labs

## 2019-11-08 NOTE — Telephone Encounter (Signed)
-----   Message from Virgel Manifold, MD sent at 11/05/2019  2:44 PM EDT ----- Herb Grays please let the patient know, her liver enzymes have improved from before but are still elevated.  I have ordered further work-up for this.  Besides the work-up that I have ordered, please also order repeat hepatic function panel to be done in 4 weeks.  Thank you

## 2019-11-08 NOTE — Telephone Encounter (Signed)
I have sent all her medications including Abilify to her pharmacy.  I spoke to patient and clarified medications.

## 2019-11-08 NOTE — Addendum Note (Signed)
Addended by: Memory Argue H on: 11/08/2019 03:05 PM   Modules accepted: Orders

## 2019-11-08 NOTE — Telephone Encounter (Signed)
Patient called me back and I informed her what Dr. Bonna Gains wanted me to tell her. I also told patient about additional labs that she wanted her to have. I offered her to come in to our office or she could go to any [facility] location and have them drawn. Patient stated that she would go to [facility] and have it done. I also told her that in 4 weeks she also wanted her to have her Hepatic Function Panel checked again and that I would call her to remind her. Patient agreed and had no further questions.

## 2019-11-08 NOTE — Telephone Encounter (Signed)
Called patient and left her a detailed message letting her know that she needs to come in for more labs to be drawn here or she could go to any [facility] location. I also stated that Dr. Bonna Gains would want her hepatic function panel to be repeated again in 4 weeks. Waiting on patient to call me back to confirm that she received this message.

## 2019-11-12 ENCOUNTER — Other Ambulatory Visit: Payer: Self-pay | Admitting: Physician Assistant

## 2019-11-12 DIAGNOSIS — M4807 Spinal stenosis, lumbosacral region: Secondary | ICD-10-CM

## 2019-11-12 DIAGNOSIS — G8929 Other chronic pain: Secondary | ICD-10-CM

## 2019-11-13 ENCOUNTER — Telehealth: Payer: Self-pay

## 2019-11-13 NOTE — Telephone Encounter (Signed)
Received request that she needs to stop her Abilify and Cymbalta 48 hours before and 24 hours after having her CT scan done.  They want an order faxed.   -Okay to discontinue Abilify 5 mg 48 hours prior to and 24 hours after CT.  -Okay to discontinue Cymbalta 30 mg and Cymbalta 60 mg 48 hours prior to and 24 hours after CMP.  We will have [facility] fax this information.

## 2019-11-13 NOTE — Telephone Encounter (Signed)
letter about discontinued abilify and cymbalta doing the ct scan was faxed and confirmed.

## 2019-11-13 NOTE — Telephone Encounter (Signed)
pt needs to stop her abilify and cymbalta 48 hours before and 24 hours after having ct done.  they want a order faxed to [phone] attn: [email or web address].  radiology scheduling   a doctor bar at [facility]

## 2019-11-13 NOTE — Telephone Encounter (Signed)
Okay to discontinue Abilify 5 mg 48 hours prior to when 24 hours after CT.  Okay to discontinue Cymbalta 30 mg and Cymbalta 60 mg 48 hours prior to and 24 hours after CT.

## 2019-11-22 ENCOUNTER — Ambulatory Visit
Admission: RE | Admit: 2019-11-22 | Discharge: 2019-11-22 | Disposition: A | Discharge status: Home / Self Care | Payer: Medicare Other | Source: Ambulatory Visit | Attending: Physician Assistant | Admitting: Physician Assistant

## 2019-11-22 ENCOUNTER — Other Ambulatory Visit: Payer: Self-pay

## 2019-11-22 DIAGNOSIS — M4807 Spinal stenosis, lumbosacral region: Secondary | ICD-10-CM | POA: Insufficient documentation

## 2019-11-22 DIAGNOSIS — M545 Low back pain, unspecified: Secondary | ICD-10-CM

## 2019-11-22 DIAGNOSIS — G8929 Other chronic pain: Secondary | ICD-10-CM

## 2019-11-22 DIAGNOSIS — R9389 Abnormal findings on diagnostic imaging of other specified body structures: Secondary | ICD-10-CM | POA: Insufficient documentation

## 2019-11-22 LAB — CBC WITH DIFFERENTIAL/PLATELET
Abs Immature Granulocytes: 0.02 10*3/uL (ref 0.00–0.07)
Basophils Absolute: 0.1 10*3/uL (ref 0.0–0.1)
Basophils Relative: 1 %
Eosinophils Absolute: 0.2 10*3/uL (ref 0.0–0.5)
Eosinophils Relative: 3 %
HCT: 41.2 % (ref 36.0–46.0)
Hemoglobin: 13.9 g/dL (ref 12.0–15.0)
Immature Granulocytes: 0 %
Lymphocytes Relative: 33 %
Lymphs Abs: 2.2 10*3/uL (ref 0.7–4.0)
MCH: 29.6 pg (ref 26.0–34.0)
MCHC: 33.7 g/dL (ref 30.0–36.0)
MCV: 87.7 fL (ref 80.0–100.0)
Monocytes Absolute: 0.6 10*3/uL (ref 0.1–1.0)
Monocytes Relative: 9 %
Neutro Abs: 3.7 10*3/uL (ref 1.7–7.7)
Neutrophils Relative %: 54 %
Platelets: 217 10*3/uL (ref 150–400)
RBC: 4.7 MIL/uL (ref 3.87–5.11)
RDW: 13.3 % (ref 11.5–15.5)
WBC: 6.7 10*3/uL (ref 4.0–10.5)
nRBC: 0 % (ref 0.0–0.2)

## 2019-11-22 LAB — APTT: aPTT: 31 seconds (ref 24–36)

## 2019-11-22 LAB — PROTIME-INR
INR: 1 (ref 0.8–1.2)
Prothrombin Time: 12.5 seconds (ref 11.4–15.2)

## 2019-11-22 LAB — GLUCOSE, CAPILLARY: Glucose-Capillary: 139 mg/dL — ABNORMAL HIGH (ref 70–99)

## 2019-11-22 MED ORDER — IOHEXOL 180 MG/ML  SOLN
10.0000 mL | Freq: Once | INTRAMUSCULAR | Status: AC | PRN
  Administered 2019-11-22: 10 mL

## 2019-11-22 NOTE — Progress Notes (Signed)
Agree with the details of the visit as noted below by Rexene Edison, NP.  C. Derrill Kay, MD [facility]

## 2019-11-22 NOTE — Progress Notes (Signed)
Pt stable after myelogram.Vss.Back stable.F/u with her MD.Thankyou.

## 2019-12-13 ENCOUNTER — Telehealth: Payer: Self-pay

## 2019-12-13 DIAGNOSIS — R748 Abnormal levels of other serum enzymes: Secondary | ICD-10-CM

## 2019-12-13 NOTE — Telephone Encounter (Signed)
-----   Message from [place], [place] sent at 12/03/2019  4:10 PM EDT ----- Regarding: RE: Lab  ----- Message ----- From: Wayna Chalet, CMA Sent: 12/03/2019 To: Wayna Chalet, CMA Subject: Lab                                            Order a Hepatic function panel by 12/05/2019. Call ppt to let her know.

## 2019-12-13 NOTE — Telephone Encounter (Signed)
Called patient but was not able to speak with her or leave her a voicemail. Patient has MyChart so I will send her a message thru there letting her know that she will be due for her hepatic function panel to be drawn again and that I am switching her appointment to the [facility] location since she lives there.

## 2019-12-17 ENCOUNTER — Other Ambulatory Visit: Payer: Self-pay

## 2019-12-17 ENCOUNTER — Encounter: Payer: Self-pay | Admitting: Psychiatry

## 2019-12-17 ENCOUNTER — Telehealth (INDEPENDENT_AMBULATORY_CARE_PROVIDER_SITE_OTHER): Payer: Medicare Other | Admitting: Psychiatry

## 2019-12-17 DIAGNOSIS — F3342 Major depressive disorder, recurrent, in full remission: Secondary | ICD-10-CM | POA: Diagnosis not present

## 2019-12-17 DIAGNOSIS — F411 Generalized anxiety disorder: Secondary | ICD-10-CM

## 2019-12-17 DIAGNOSIS — F5105 Insomnia due to other mental disorder: Secondary | ICD-10-CM

## 2019-12-17 DIAGNOSIS — F4310 Post-traumatic stress disorder, unspecified: Secondary | ICD-10-CM | POA: Diagnosis not present

## 2019-12-17 DIAGNOSIS — F3341 Major depressive disorder, recurrent, in partial remission: Secondary | ICD-10-CM | POA: Insufficient documentation

## 2019-12-17 MED ORDER — LAMOTRIGINE 25 MG PO TABS: ORAL_TABLET | ORAL | 0 refills | Status: DC

## 2019-12-17 MED ORDER — HYDROXYZINE PAMOATE 25 MG PO CAPS: ORAL_CAPSULE | ORAL | 0 refills | Status: DC

## 2019-12-17 MED ORDER — LAMOTRIGINE 100 MG PO TABS: 100.0000 mg | ORAL_TABLET | Freq: Every day | ORAL | 0 refills | Status: DC

## 2019-12-17 NOTE — Progress Notes (Signed)
Provider Location : [facility] Patient Location : [facility]  Virtual Visit via Video Note  I connected with Christine Mcneil on 12/17/19 at  4:20 PM EDT by a video enabled telemedicine application and verified that I am speaking with the correct person using two identifiers.   I discussed the limitations of evaluation and management by telemedicine and the availability of in person appointments. The patient expressed understanding and agreed to proceed    I discussed the assessment and treatment plan with the patient. The patient was provided an opportunity to ask questions and all were answered. The patient agreed with the plan and demonstrated an understanding of the instructions.   The patient was advised to call back or seek an in-person evaluation if the symptoms worsen or if the condition fails to improve as anticipated.  [facility] MD OP Progress Note  12/17/2019 5:13 PM Christine Mcneil  MRN:  [id number]  Chief Complaint:  Chief Complaint    Follow-up     HPI: Christine Mcneil is a 54 year old Caucasian female, divorced, lives in [place], has a history of MDD, PTSD, insomnia, OSA-resolved, history of CVA, history of prothrombin gene mutation, chronic pain-migraine headaches, iron deficiency was evaluated by telemedicine today.  Patient today reports she is currently doing well with regards to her mood symptoms.  She denies any significant mood lability, anxiety or sadness.  She reports she is compliant on medications as prescribed.  She reports sleep is improved on the combination of trazodone and melatonin 10 mg at bedtime.  She denies side effects.  She continues to have GI symptoms of coughing up blood and she is currently working with a GI specialist.  She also reports sciatica and reports pain as currently severe.  She will continue to follow-up with her providers for the same.  She reports she may need surgery to control her pain however she has to get her hemoglobin A1c under control prior to  that.  She is currently watching her diet.  Patient denies any suicidality, homicidality or perceptual disturbances.  Patient denies any other concerns today.  Visit Diagnosis:    ICD-10-CM   1. MDD (major depressive disorder), recurrent, in full remission ([place])  F33.42 lamoTRIgine (LAMICTAL) 100 MG tablet    lamoTRIgine (LAMICTAL) 25 MG tablet  2. PTSD (post-traumatic stress disorder)  F43.10 lamoTRIgine (LAMICTAL) 25 MG tablet  3. GAD (generalized anxiety disorder)  F41.1 hydrOXYzine (VISTARIL) 25 MG capsule  4. Insomnia due to mental disorder  F51.05     Past Psychiatric History: I have reviewed past psychiatric history from my progress note on 08/16/2017.  Past trials of Effexor, Klonopin  Past Medical History:  Past Medical History:  Diagnosis Date  . Abdominal pain   . Anxiety and depression   . Atypical facial pain   . Bilateral occipital neuralgia   . Cervical spondylosis without myelopathy   . Chronic daily headache   . Chronic pain   . Chronic pelvic pain in female   . Chronic thoracic back pain   . Depression   . Diabetes mellitus without complication ([place])   . Diabetes mellitus, type II ([place])   . Dysphagia   . Dysrhythmia   . Fibromyalgia   . Hyperlipidemia   . Hypertension   . Insomnia   . Iron deficiency anemia   . Left hip pain   . Low back pain   . Migraines   . Numbness   . Optic neuropathy   . OSA (obstructive sleep apnea)   .  Polyarthralgia   . Primary osteoarthritis of both hips   . Prothrombin gene mutation ([place])   . Pseudotumor cerebri   . Rectal bleeding   . Right shoulder pain   . Rotator cuff syndrome   . Stroke ([place])   . Thyroid disease   . TIA (transient ischemic attack) 05/27/2017    Past Surgical History:  Procedure Laterality Date  . ABDOMINAL HYSTERECTOMY     total  . CHOLECYSTECTOMY    . COLONOSCOPY WITH PROPOFOL N/A 03/21/2018   Procedure: COLONOSCOPY WITH PROPOFOL;  Surgeon: Lucilla Lame, MD;  Location: [facility] ENDOSCOPY;   Service: Endoscopy;  Laterality: N/A;  . ESOPHAGOGASTRODUODENOSCOPY (EGD) WITH PROPOFOL N/A 03/21/2018   Procedure: ESOPHAGOGASTRODUODENOSCOPY (EGD) WITH PROPOFOL;  Surgeon: Lucilla Lame, MD;  Location: [facility] ENDOSCOPY;  Service: Endoscopy;  Laterality: N/A;  . GIVENS CAPSULE STUDY N/A 09/24/2019   Procedure: GIVENS CAPSULE STUDY;  Surgeon: Virgel Manifold, MD;  Location: [facility] ENDOSCOPY;  Service: Endoscopy;  Laterality: N/A;  . JOINT REPLACEMENT    . KNEE SURGERY Left   . ROTATOR CUFF REPAIR Right   . spinal neurostimulator      Family Psychiatric History: I have reviewed family psychiatric history from my progress note on 08/16/2017  Family History:  Family History  Problem Relation Age of Onset  . Diabetes Mother   . Hyperlipidemia Mother   . Hypertension Mother   . COPD Mother   . CVA Mother   . Anemia Mother   . Diabetes Father   . Hyperlipidemia Father   . Hypertension Father   . Thyroid disease Father   . Alcohol abuse Father   . Peripheral vascular disease Sister   . Hypertension Sister   . Anxiety disorder Son   . Depression Son   . Bipolar disorder Son   . Seizures Son     Social History: I have reviewed social history from my progress note on 08/16/2017 Social History   Socioeconomic History  . Marital status: Divorced    Spouse name: Not on file  . Number of children: 1  . Years of education: Not on file  . Highest education level: High school graduate  Occupational History    Comment: disablitiy  Tobacco Use  . Smoking status: Never Smoker  . Smokeless tobacco: Never Used  Vaping Use  . Vaping Use: Never used  Substance and Sexual Activity  . Alcohol use: No  . Drug use: No  . Sexual activity: Not Currently  Other Topics Concern  . Not on file  Social History Narrative  . Not on file   Social Determinants of Health   Financial Resource Strain:   . Difficulty of Paying Living Expenses:   Food Insecurity:   . Worried About Sales executive in the Last Year:   . Arboriculturist in the Last Year:   Transportation Needs:   . Film/video editor (Medical):   Marland Kitchen Lack of Transportation (Non-Medical):   Physical Activity:   . Days of Exercise per Week:   . Minutes of Exercise per Session:   Stress:   . Feeling of Stress :   Social Connections:   . Frequency of Communication with Friends and Family:   . Frequency of Social Gatherings with Friends and Family:   . Attends Religious Services:   . Active Member of Clubs or Organizations:   . Attends Archivist Meetings:   Marland Kitchen Marital Status:     Allergies:  Allergies  Allergen  Reactions  . Amoxapine Itching  . Amoxicillin Itching  . Dapagliflozin Itching    Other reaction(s): Other (See Comments) Thrush & vaginal itching  . Keflex [Cephalexin] Itching  . Mirtazapine Other (See Comments)    Pt states felling "like my body is outside of me."    Metabolic Disorder Labs: Lab Results  Component Value Date   HGBA1C 9.9 (H) 06/21/2017   MPG 237.43 06/21/2017   Lab Results  Component Value Date   PROLACTIN 5.5 09/06/2017   Lab Results  Component Value Date   CHOL 277 (H) 06/21/2017   TRIG 196 (H) 06/21/2017   HDL 64 06/21/2017   CHOLHDL 4.3 06/21/2017   VLDL 39 06/21/2017   LDLCALC 174 (H) 06/21/2017   Lab Results  Component Value Date   TSH 2.890 09/06/2017   TSH 3.176 01/04/2017    Therapeutic Level Labs: No results found for: LITHIUM No results found for: VALPROATE No components found for:  CBMZ  Current Medications: Current Outpatient Medications  Medication Sig Dispense Refill  . ACCU-CHEK AVIVA PLUS test strip     . ACCU-CHEK SOFTCLIX LANCETS lancets     . acetaZOLAMIDE (DIAMOX) 125 MG tablet Take 125 mg by mouth daily.     Marland Kitchen AIMOVIG 70 MG/ML SOAJ Inject 70 mg into the skin every 28 (twenty-eight) days.    Marland Kitchen albuterol (VENTOLIN HFA) 108 (90 Base) MCG/ACT inhaler Inhale 2 puffs into the lungs every 6 (six) hours as needed for  wheezing or shortness of breath. 1 g 0  . ARIPiprazole (ABILIFY) 5 MG tablet Take 1 tablet (5 mg total) by mouth daily. 90 tablet 1  . aspirin-acetaminophen-caffeine (EXCEDRIN MIGRAINE) 250-250-65 MG tablet Take 2 tablets by mouth every 8 (eight) hours as needed for headache or migraine.     Marland Kitchen atorvastatin (LIPITOR) 80 MG tablet Take 80 mg by mouth daily at 6 PM.    . baclofen (LIORESAL) 10 MG tablet TAKE 1 TABLET BY MOUTH ONCE DAILY AS NEEDED FOR UP TO 30 DAYS. MAY TAKE 2 TABLETS IF NEEDED    . Blood Glucose Monitoring Suppl (ACCU-CHEK AVIVA PLUS) w/Device KIT     . Carboxymethylcellulose Sod PF (REFRESH PLUS) 0.5 % SOLN Apply to eye.    . Cholecalciferol (VITAMIN D3) 1000 units CAPS Take 1 capsule by mouth daily.    . clotrimazole (LOTRIMIN) 1 % cream     . cyclobenzaprine (FLEXERIL) 5 MG tablet Take 5 mg by mouth 3 (three) times daily as needed.     . DULoxetine (CYMBALTA) 30 MG capsule Take 1 capsule (30 mg total) by mouth daily. To be combined with 60 mg 90 capsule 0  . DULoxetine (CYMBALTA) 60 MG capsule Take 1 capsule (60 mg total) by mouth daily. To be combined with 30 mg 90 capsule 0  . esomeprazole (NEXIUM) 40 MG capsule Take 40 mg by mouth daily.    . famotidine (PEPCID) 40 MG tablet Take 40 mg by mouth 2 (two) times daily.    Marland Kitchen FARXIGA 5 MG TABS tablet Take by mouth every morning.    . ferrous sulfate 325 (65 FE) MG tablet Take by mouth. Take 325 mg daily with breakfast    . fluticasone (FLONASE) 50 MCG/ACT nasal spray Place 2 sprays into both nostrils as needed.     . furosemide (LASIX) 20 MG tablet Take by mouth.    . hydrOXYzine (VISTARIL) 25 MG capsule TAKE (1) CAPSULE BY MOUTH TWICE DAILY ASNEEDED FOR SEVERA ANXIETY ATTACKS 180 capsule 0  .  Insulin Degludec (TRESIBA FLEXTOUCH) 200 UNIT/ML SOPN Inject into the skin.    Marland Kitchen lamoTRIgine (LAMICTAL) 100 MG tablet Take 1 tablet (100 mg total) by mouth daily. To be combined with 25 mg 90 tablet 0  . lamoTRIgine (LAMICTAL) 25 MG tablet  TAKE ONE TABLET BY MOUTH DAILY. TO BE COMBINED WITH 100 MG. 90 tablet 0  . levothyroxine (SYNTHROID, LEVOTHROID) 100 MCG tablet Take 100 mcg by mouth daily.    Marland Kitchen loratadine (CLARITIN) 10 MG tablet Take 10 mg by mouth daily as needed for allergies.    . Magnesium (V-R MAGNESIUM) 250 MG TABS Take by mouth.    . Magnesium 250 MG TABS Take 1 tablet by mouth daily.     . Melatonin 10 MG TABS Take 2 tablets by mouth at bedtime.    . metFORMIN (GLUCOPHAGE-XR) 500 MG 24 hr tablet Take 500 mg by mouth 2 (two) times daily.     . naloxone (NARCAN) nasal spray 4 mg/0.1 mL 1 spray into nose for opioid overdose. If needed, may repeat spray in 2-3 min    . NUCYNTA ER 100 MG 12 hr tablet Take 1 tablet by mouth 2 (two) times daily.    Marland Kitchen nystatin (MYCOSTATIN) 100000 UNIT/ML suspension Take 5 mLs by mouth 4 (four) times daily.    Marland Kitchen omeprazole (PRILOSEC) 20 MG capsule TAKE (1) CAPSULE BY MOUTH EVERY DAY 30 capsule 0  . ondansetron (ZOFRAN ODT) 4 MG disintegrating tablet Take 1 tablet (4 mg total) by mouth every 8 (eight) hours as needed for nausea or vomiting. 12 tablet 0  . ondansetron (ZOFRAN) 4 MG tablet TAKE (1) TABLET BY MOUTH EVERY 8 HOURS AS NEEDED FOR NAUSEA AND VOMITING    . OZEMPIC, 0.25 OR 0.5 MG/DOSE, 2 MG/1.5ML SOPN Inject into the skin.    Marland Kitchen quinapril (ACCUPRIL) 10 MG tablet Take 10 mg by mouth daily.    . Rimegepant Sulfate (NURTEC) 75 MG TBDP Take 1 tablet by mouth as needed.     . traZODone (DESYREL) 100 MG tablet Take 1.5-2 tablets (150-200 mg total) by mouth at bedtime as needed for sleep. 180 tablet 0  . TRESIBA FLEXTOUCH 200 UNIT/ML SOPN Inject 32 Units into the skin at bedtime.     . dabigatran (PRADAXA) 150 MG CAPS capsule Take 150 mg by mouth 2 (two) times daily.    Marland Kitchen diltiazem (CARDIZEM CD) 240 MG 24 hr capsule Take by mouth. Take 1 capsule by mouth once daily    . pioglitazone (ACTOS) 15 MG tablet Take by mouth. Take 1 tablet by mouth once daily     No current facility-administered  medications for this visit.     Musculoskeletal: Strength & Muscle Tone: UTA Gait & Station: normal Patient leans: N/A  Psychiatric Specialty Exam: Review of Systems  Musculoskeletal: Positive for back pain.  Psychiatric/Behavioral: Negative for agitation, behavioral problems, confusion, decreased concentration, dysphoric mood, hallucinations, self-injury, sleep disturbance and suicidal ideas. The patient is not nervous/anxious and is not hyperactive.   All other systems reviewed and are negative.   There were no vitals taken for this visit.There is no height or weight on file to calculate BMI.  General Appearance: Casual  Eye Contact:  Fair  Speech:  Normal Rate  Volume:  Normal  Mood:  Euthymic  Affect:  Congruent  Thought Process:  Goal Directed and Descriptions of Associations: Intact  Orientation:  Full (Time, Place, and Person)  Thought Content: Logical   Suicidal Thoughts:  No  Homicidal Thoughts:  No  Memory:  Immediate;   Fair Recent;   Fair Remote;   Fair  Judgement:  Fair  Insight:  Fair  Psychomotor Activity:  Normal  Concentration:  Concentration: Fair and Attention Span: Fair  Recall:  [employer] of Knowledge: Fair  Language: Fair  Akathisia:  No  Handed:  Right  AIMS (if indicated): UTA  Assets:  Communication Skills Desire for Improvement Housing Social Support  ADL's:  Intact  Cognition: WNL  Sleep:  Fair   Screenings:   Assessment and Plan: KRIZIA FLIGHT is a 54 year old Caucasian female who has a history of MDD, PTSD, chronic pain, migraine headaches, history of CVA, iron deficiency, prothrombin gene mutation was evaluated by telemedicine today.  Patient is currently making progress on current medication regimen.  She however continues to have medical problems including GI symptoms as well as pain.  Plan as noted below.  Plan MDD in full remission Cymbalta 90 mg p.o. daily Lamictal 125 mg p.o. daily  GAD-stable Cymbalta 90 mg p.o.  daily Hydroxyzine 25 mg p.o. daily as needed for anxiety attacks  PTSD-stable CBT as needed  Insomnia-stable Trazodone 150 to 200 mg p.o. nightly Melatonin 3 to 9 mg p.o. nightly  Patient to continue to follow-up with her providers for management of her GI symptoms and pain.  Follow-up in clinic in 2 to 3 months or sooner if needed.  I have spent atleast 20 minutes non face to face with patient today. More than 50 % of the time was spent for preparing to see the patient ( e.g., review of test, records ), ordering medications and test ,psychoeducation and supportive psychotherapy and care coordination,as well as documenting clinical information in electronic health record.This note was generated in part or whole with voice recognition software. Voice recognition is usually quite accurate but there are transcription errors that can and very often do occur. I apologize for any typographical errors that were not detected and corrected.      Ursula Alert, MD 12/17/2019, 5:13 PM

## 2019-12-18 ENCOUNTER — Telehealth: Payer: Medicare Other | Admitting: Psychiatry

## 2019-12-18 ENCOUNTER — Other Ambulatory Visit: Payer: Self-pay

## 2019-12-18 ENCOUNTER — Inpatient Hospital Stay: Payer: Medicare Other | Attending: Hematology and Oncology

## 2019-12-18 DIAGNOSIS — D509 Iron deficiency anemia, unspecified: Secondary | ICD-10-CM | POA: Insufficient documentation

## 2019-12-18 DIAGNOSIS — D508 Other iron deficiency anemias: Secondary | ICD-10-CM

## 2019-12-18 LAB — CBC WITH DIFFERENTIAL/PLATELET
Abs Immature Granulocytes: 0.01 10*3/uL (ref 0.00–0.07)
Basophils Absolute: 0 10*3/uL (ref 0.0–0.1)
Basophils Relative: 1 %
Eosinophils Absolute: 0.2 10*3/uL (ref 0.0–0.5)
Eosinophils Relative: 3 %
HCT: 40 % (ref 36.0–46.0)
Hemoglobin: 13.1 g/dL (ref 12.0–15.0)
Immature Granulocytes: 0 %
Lymphocytes Relative: 36 %
Lymphs Abs: 2.2 10*3/uL (ref 0.7–4.0)
MCH: 29.3 pg (ref 26.0–34.0)
MCHC: 32.8 g/dL (ref 30.0–36.0)
MCV: 89.5 fL (ref 80.0–100.0)
Monocytes Absolute: 0.3 10*3/uL (ref 0.1–1.0)
Monocytes Relative: 6 %
Neutro Abs: 3.3 10*3/uL (ref 1.7–7.7)
Neutrophils Relative %: 54 %
Platelets: 198 10*3/uL (ref 150–400)
RBC: 4.47 MIL/uL (ref 3.87–5.11)
RDW: 13.2 % (ref 11.5–15.5)
WBC: 6.1 10*3/uL (ref 4.0–10.5)
nRBC: 0 % (ref 0.0–0.2)

## 2019-12-18 NOTE — Progress Notes (Signed)
[facility]  [address] [place], [place] [address] Phone: [phone]  Fax: [phone]   Clinic Day:  12/19/2019  Referring physician: Doughton, Latricia Heft, MD  Chief Complaint: Christine Mcneil is a 54 y.o. female with iron deficiency anemia who is seen for 6 month assessment.   HPI: The patient was last seen in the hematology clinic via telemedicine on 06/18/2019. At that time, she felt tired.  She noted hemorrhoidal bleeding.  Exam was stable. Hematocrit was 41.9, hemoglobin 13.7, platelets 215,000, WBC 8,600. Ferritin was 20 with an iron saturation of 13% and a TIBC of 484. She received Venofer.   The patient was seen in the [facility] ER on 09/23/2019 with abdominal pain, nausea, and vomiting. She was found to have elevated liver enzymes: AST 69, ALT 73, alkaline phosphatase 168. CBC was normal. CT abdomen and pelvic revealed no acute abnormality. It was felt that her symptoms were an adverse reaction to starting Ozempic.  The patient saw Rexene Edison, NP on 10/29/2019 for a follow up regarding hemoptysis x 6 months. She had not had any episodes of hemoptysis for 1 month prior to the visit. She had undergone extensive work-up by ENT and GI but no etiology was found.  The patient saw Dr. Bonna Gains on 10/31/2019 for a follow up regarding iron deficiency anemia. She felt tired and fatigued. She had had an inconclusive capsule endoscopy because the capsule did not enter the small bowel. She has a follow up appointment on 01/01/2020.  The patient saw Gladstone Pih, NP, cardiology, on 11/29/2019 and reported sciatic nerve pain. She was compliant with all of her medications and was attempting to follow a healthier diet. Will follow up in 6 months.  Labs followed: 09/17/2019: Hematocrit 40.4, hemoglobin 13.0, platelets 221,000, WBC 6,700. Ferritin 42. 11/22/2019: Hematocrit 41.2, hemoglobin 13.9, platelets 217,000, WBC 6,700.  Liver function testing was normal on  11/08/2019 with a AST of 41, ALT 44, alkaline phosphatase 98 and bilirubin 0.2.  During the interim, she has been "ok". The patient wakes up every morning and coughs and sees a small amount of brownish-red blood on the tissue. She tries to blow her nose, but nothing comes out. This stopped for a while but started again 2 weeks ago.   The patient has sciatic nerve pain in her back. She is planning to have surgery once she gets her blood sugar under control. She sleeps a lot and takes naps in the evenings. She is eating well. She reports leg swelling that goes away overnight. She still has joint pain, restless legs, and fibromyalgia. She is no longer taking Ozempic. Her shortness of breath has resolved. She notes that she had a sleep study that revealed moderate sleep apnea.  However, she states that she cannot use a CPAP machine.  The patient would like to receive Venofer today.   Past Medical History:  Diagnosis Date  . Abdominal pain   . Anxiety and depression   . Atypical facial pain   . Bilateral occipital neuralgia   . Cervical spondylosis without myelopathy   . Chronic daily headache   . Chronic pain   . Chronic pelvic pain in female   . Chronic thoracic back pain   . Depression   . Diabetes mellitus without complication ([place])   . Diabetes mellitus, type II ([place])   . Dysphagia   . Dysrhythmia   . Fibromyalgia   . Hyperlipidemia   . Hypertension   . Insomnia   .  Iron deficiency anemia   . Left hip pain   . Low back pain   . Migraines   . Numbness   . Optic neuropathy   . OSA (obstructive sleep apnea)   . Polyarthralgia   . Primary osteoarthritis of both hips   . 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 Pseudotumor cerebri   . Rectal bleeding   . Right shoulder pain   . Rotator cuff syndrome   . Stroke ([place])   . Thyroid disease   . TIA (transient ischemic attack) 05/27/2017    Past Surgical History:  Procedure Laterality Date  . ABDOMINAL HYSTERECTOMY     total  .  CHOLECYSTECTOMY    . COLONOSCOPY WITH PROPOFOL N/A 03/21/2018   Procedure: COLONOSCOPY WITH PROPOFOL;  Surgeon: Lucilla Lame, MD;  Location: [facility] ENDOSCOPY;  Service: Endoscopy;  Laterality: N/A;  . ESOPHAGOGASTRODUODENOSCOPY (EGD) WITH PROPOFOL N/A 03/21/2018   Procedure: ESOPHAGOGASTRODUODENOSCOPY (EGD) WITH PROPOFOL;  Surgeon: Lucilla Lame, MD;  Location: [facility] ENDOSCOPY;  Service: Endoscopy;  Laterality: N/A;  . GIVENS CAPSULE STUDY N/A 09/24/2019   Procedure: GIVENS CAPSULE STUDY;  Surgeon: Virgel Manifold, MD;  Location: [facility] ENDOSCOPY;  Service: Endoscopy;  Laterality: N/A;  . JOINT REPLACEMENT    . KNEE SURGERY Left   . ROTATOR CUFF REPAIR Right   . spinal neurostimulator      Family History  Problem Relation Age of Onset  . Diabetes Mother   . Hyperlipidemia Mother   . Hypertension Mother   . COPD Mother   . CVA Mother   . Anemia Mother   . Diabetes Father   . Hyperlipidemia Father   . Hypertension Father   . Thyroid disease Father   . Alcohol abuse Father   . Peripheral vascular disease Sister   . Hypertension Sister   . Anxiety disorder Son   . Depression Son   . Bipolar disorder Son   . Seizures Son     Social History:  reports that she has never smoked. She has never used smokeless tobacco. She reports that she does not drink alcohol and does not use drugs. She has been on disability since 2013 to difficulty with ambulation and fibromyalgia. She lives in [place]. The patient is alone today.  Allergies:  Allergies  Allergen Reactions  . Amoxapine Itching  . Amoxicillin Itching  . Dapagliflozin Itching    Other reaction(s): Other (See Comments) Thrush & vaginal itching  . Keflex [Cephalexin] Itching  . Mirtazapine Other (See Comments)    Pt states felling "like my body is outside of me."    Current Medications: Current Outpatient Medications  Medication Sig Dispense Refill  . ACCU-CHEK AVIVA PLUS test strip     . ACCU-CHEK SOFTCLIX LANCETS lancets      . acetaZOLAMIDE (DIAMOX) 125 MG tablet Take 125 mg by mouth daily.     Marland Kitchen AIMOVIG 70 MG/ML SOAJ Inject 70 mg into the skin every 28 (twenty-eight) days.    Marland Kitchen albuterol (VENTOLIN HFA) 108 (90 Base) MCG/ACT inhaler Inhale 2 puffs into the lungs every 6 (six) hours as needed for wheezing or shortness of breath. 1 g 0  . ARIPiprazole (ABILIFY) 5 MG tablet Take 1 tablet (5 mg total) by mouth daily. 90 tablet 1  . aspirin-acetaminophen-caffeine (EXCEDRIN MIGRAINE) 250-250-65 MG tablet Take 2 tablets by mouth every 8 (eight) hours as needed for headache or migraine.     Marland Kitchen atorvastatin (LIPITOR) 80 MG tablet Take 80 mg by mouth daily at 6 PM.    .  baclofen (LIORESAL) 10 MG tablet TAKE 1 TABLET BY MOUTH ONCE DAILY AS NEEDED FOR UP TO 30 DAYS. MAY TAKE 2 TABLETS IF NEEDED    . Blood Glucose Monitoring Suppl (ACCU-CHEK AVIVA PLUS) w/Device KIT     . Carboxymethylcellulose Sod PF (REFRESH PLUS) 0.5 % SOLN Apply to eye.    . Cholecalciferol (VITAMIN D3) 1000 units CAPS Take 1 capsule by mouth daily.    . clotrimazole (LOTRIMIN) 1 % cream     . cyclobenzaprine (FLEXERIL) 5 MG tablet Take 5 mg by mouth 3 (three) times daily as needed.     . DULoxetine (CYMBALTA) 30 MG capsule Take 1 capsule (30 mg total) by mouth daily. To be combined with 60 mg 90 capsule 0  . DULoxetine (CYMBALTA) 60 MG capsule Take 1 capsule (60 mg total) by mouth daily. To be combined with 30 mg 90 capsule 0  . esomeprazole (NEXIUM) 40 MG capsule Take 40 mg by mouth daily.    . famotidine (PEPCID) 40 MG tablet Take 40 mg by mouth 2 (two) times daily.    Marland Kitchen FARXIGA 5 MG TABS tablet Take by mouth every morning.    . ferrous sulfate 325 (65 FE) MG tablet Take by mouth. Take 325 mg daily with breakfast    . fluticasone (FLONASE) 50 MCG/ACT nasal spray Place 2 sprays into both nostrils as needed.     . furosemide (LASIX) 20 MG tablet Take by mouth.    . hydrOXYzine (VISTARIL) 25 MG capsule TAKE (1) CAPSULE BY MOUTH TWICE DAILY ASNEEDED FOR  SEVERA ANXIETY ATTACKS 180 capsule 0  . Insulin Degludec (TRESIBA FLEXTOUCH) 200 UNIT/ML SOPN Inject into the skin.    Marland Kitchen lamoTRIgine (LAMICTAL) 100 MG tablet Take 1 tablet (100 mg total) by mouth daily. To be combined with 25 mg 90 tablet 0  . lamoTRIgine (LAMICTAL) 25 MG tablet TAKE ONE TABLET BY MOUTH DAILY. TO BE COMBINED WITH 100 MG. 90 tablet 0  . levothyroxine (SYNTHROID, LEVOTHROID) 100 MCG tablet Take 100 mcg by mouth daily.    Marland Kitchen loratadine (CLARITIN) 10 MG tablet Take 10 mg by mouth daily as needed for allergies.    . Magnesium (V-R MAGNESIUM) 250 MG TABS Take by mouth.    . Magnesium 250 MG TABS Take 1 tablet by mouth daily.     . Melatonin 10 MG TABS Take 2 tablets by mouth at bedtime.    . metFORMIN (GLUCOPHAGE-XR) 500 MG 24 hr tablet Take 500 mg by mouth 2 (two) times daily.     Gean Birchwood ER 100 MG 12 hr tablet Take 1 tablet by mouth 2 (two) times daily.    Marland Kitchen nystatin (MYCOSTATIN) 100000 UNIT/ML suspension Take 5 mLs by mouth 4 (four) times daily.    Marland Kitchen omeprazole (PRILOSEC) 20 MG capsule TAKE (1) CAPSULE BY MOUTH EVERY DAY 30 capsule 0  . OZEMPIC, 0.25 OR 0.5 MG/DOSE, 2 MG/1.5ML SOPN Inject into the skin.    Marland Kitchen quinapril (ACCUPRIL) 10 MG tablet Take 10 mg by mouth daily.    . Rimegepant Sulfate (NURTEC) 75 MG TBDP Take 1 tablet by mouth as needed.     . traZODone (DESYREL) 100 MG tablet Take 1.5-2 tablets (150-200 mg total) by mouth at bedtime as needed for sleep. 180 tablet 0  . TRESIBA FLEXTOUCH 200 UNIT/ML SOPN Inject 32 Units into the skin at bedtime.     . dabigatran (PRADAXA) 150 MG CAPS capsule Take 150 mg by mouth 2 (two) times daily.    Marland Kitchen  diltiazem (CARDIZEM CD) 240 MG 24 hr capsule Take by mouth. Take 1 capsule by mouth once daily    . naloxone (NARCAN) nasal spray 4 mg/0.1 mL 1 spray into nose for opioid overdose. If needed, may repeat spray in 2-3 min (Patient not taking: Reported on 12/19/2019)    . ondansetron (ZOFRAN ODT) 4 MG disintegrating tablet Take 1 tablet (4 mg  total) by mouth every 8 (eight) hours as needed for nausea or vomiting. (Patient not taking: Reported on 12/19/2019) 12 tablet 0  . ondansetron (ZOFRAN) 4 MG tablet TAKE (1) TABLET BY MOUTH EVERY 8 HOURS AS NEEDED FOR NAUSEA AND VOMITING (Patient not taking: Reported on 12/19/2019)    . pioglitazone (ACTOS) 15 MG tablet Take by mouth. Take 1 tablet by mouth once daily     No current facility-administered medications for this visit.    Review of Systems  Constitutional: Positive for malaise/fatigue (takes naps in the evenings). Negative for chills, diaphoresis, fever and weight loss (up 17 pounds since 06/08/2018).       Feels "alright".  Sleeping majority of the day.  No energy.  HENT: Negative.  Negative for congestion, ear discharge, ear pain, hearing loss, nosebleeds, sinus pain, sore throat and tinnitus.   Eyes: Negative.  Negative for blurred vision.  Respiratory: Positive for hemoptysis (x 6 months, when she coughs in the morning). Negative for cough, shortness of breath and wheezing.   Cardiovascular: Positive for leg swelling. Negative for chest pain, palpitations and orthopnea.  Gastrointestinal: Negative.  Negative for abdominal pain, blood in stool, constipation, diarrhea, heartburn, melena, nausea and vomiting.       Eating well. Denies cravings.  Genitourinary: Negative.  Negative for dysuria, frequency, hematuria and urgency.  Musculoskeletal: Positive for back pain (sciatic nerve pain), joint pain (Osteoarthritis in hips- undergoing pool therapy) and myalgias (fibromyalgia). Negative for neck pain.  Skin: Negative.  Negative for rash.  Neurological: Negative for dizziness, tingling, sensory change, weakness and headaches.       Restless legs.  Endo/Heme/Allergies: Does not bruise/bleed easily.       Diabetes.  Thyroid disease on Synthroid.  Psychiatric/Behavioral: Negative.  Negative for depression, memory loss and substance abuse. The patient is not nervous/anxious and does not  have insomnia.   All other systems reviewed and are negative.  Performance status (ECOG): 1  Vitals Blood pressure 107/76, pulse 86, temperature (!) 95.7 F (35.4 C), temperature source Tympanic, resp. rate 18, weight (!) 244 lb 11.4 oz (111 kg), SpO2 97 %.   Physical Exam Vitals and nursing note reviewed.  Constitutional:      General: She is not in acute distress.    Appearance: She is well-developed. She is not diaphoretic.  HENT:     Head: Normocephalic and atraumatic.     Comments: Dark blonde hair pulled back.    Mouth/Throat:     Pharynx: No oropharyngeal exudate.  Eyes:     General: No scleral icterus.    Conjunctiva/sclera: Conjunctivae normal.     Pupils: Pupils are equal, round, and reactive to light.     Comments: Blue eyes.  Neck:     Vascular: No JVD.  Cardiovascular:     Rate and Rhythm: Normal rate and regular rhythm.     Heart sounds: Normal heart sounds. No murmur heard.  No gallop.   Pulmonary:     Effort: Pulmonary effort is normal. No respiratory distress.     Breath sounds: Normal breath sounds. No wheezing or rales.  Chest:     Chest wall: No tenderness.  Abdominal:     General: Bowel sounds are normal. There is no distension.     Palpations: Abdomen is soft.     Tenderness: There is no abdominal tenderness. There is no guarding or rebound.  Musculoskeletal:        General: No tenderness. Normal range of motion.     Cervical back: Normal range of motion and neck supple.     Right lower leg: Edema (trace) present.     Left lower leg: Edema (trace) present.  Lymphadenopathy:     Head:     Right side of head: No preauricular, posterior auricular or occipital adenopathy.     Left side of head: No preauricular, posterior auricular or occipital adenopathy.     Cervical: No cervical adenopathy.     Upper Body:     Right upper body: No supraclavicular or axillary adenopathy.     Left upper body: No supraclavicular or axillary adenopathy.     Lower  Body: No right inguinal adenopathy. No left inguinal adenopathy.  Skin:    General: Skin is warm and dry.     Coloration: Skin is not pale.     Findings: No erythema or rash.  Neurological:     Mental Status: She is alert and oriented to person, place, and time.  Psychiatric:        Behavior: Behavior normal.        Thought Content: Thought content normal.        Judgment: Judgment normal.    Appointment on 12/18/2019  Component Date Value Ref Range Status  . Ferritin 12/18/2019 29  11 - 307 ng/mL Final   Performed at [facility], [address] [place], [place] [address]  . WBC 12/18/2019 6.1  4.0 - 10.5 K/uL Final  . RBC 12/18/2019 4.47  3.87 - 5.11 MIL/uL Final  . Hemoglobin 12/18/2019 13.1  12.0 - 15.0 g/dL Final  . HCT 12/18/2019 40.0  36 - 46 % Final  . MCV 12/18/2019 89.5  80.0 - 100.0 fL Final  . MCH 12/18/2019 29.3  26.0 - 34.0 pg Final  . MCHC 12/18/2019 32.8  30.0 - 36.0 g/dL Final  . RDW 12/18/2019 13.2  11.5 - 15.5 % Final  . Platelets 12/18/2019 198  150 - 400 K/uL Final  . nRBC 12/18/2019 0.0  0.0 - 0.2 % Final  . Neutrophils Relative % 12/18/2019 54  % Final  . Neutro Abs 12/18/2019 3.3  1.7 - 7.7 K/uL Final  . Lymphocytes Relative 12/18/2019 36  % Final  . Lymphs Abs 12/18/2019 2.2  0.7 - 4.0 K/uL Final  . Monocytes Relative 12/18/2019 6  % Final  . Monocytes Absolute 12/18/2019 0.3  0 - 1 K/uL Final  . Eosinophils Relative 12/18/2019 3  % Final  . Eosinophils Absolute 12/18/2019 0.2  0 - 0 K/uL Final  . Basophils Relative 12/18/2019 1  % Final  . Basophils Absolute 12/18/2019 0.0  0 - 0 K/uL Final  . Immature Granulocytes 12/18/2019 0  % Final  . Abs Immature Granulocytes 12/18/2019 0.01  0.00 - 0.07 K/uL Final   Performed at [facility], [address] [place], [place] [address]    Assessment:  YANNELY KINTZEL is a 54 y.o. female with iron deficiency anemiaof unclear etiology. She denies any melena, hematochezia, hematuria or  melena. Dietis fairly good.  Work-up on 08/22/2019revealed a hematocrit of 38.2, hemoglobin 11.8,  MCV 73.1, platelets 320,000, and white count 8100. Reticulocyte count was 1.4%. Ferritinwas 10. Iron studies included a saturation of 55% and a TIBC of 576. Normal studies included: B12, folate, SPEP, intrinsic factor antibodies, and anti-parietal antibodies.  Urinalysis on 06/08/2018 revealed no hematuria.  She received Venoferweekly x 4 (01/20/2018 - 02/09/2018), 03/14/2018, 10/10/2018, and 06/18/2019.  Ferritinhas been followed: 10 on 01/12/2018, 7 on 01/18/2018, 48 on 03/07/2018, 53 on 06/07/2018, 17 on 09/06/2018, 17 on 10/05/2018, 88 on 12/07/2018, 20 on 06/12/2019, 42 on 09/17/2019 and 29 on 12/18/2019.  EGDon 03/21/2018 was normal. Colonoscopyon 03/21/2018 revealed for 1 to 2 mm polyps in the sigmoid colon and internal hemorrhoids. Pathology revealed hyperplastic polyps.  She has history of cerebral vein thrombosis(2014) and TIA. She has aprothrombin gene mutation. She is on long term anticoagulation with Pradaxa.  She received the COVID-19 vaccine in 05/2019.  Symptomatically, she notes a fatigue and requires a nap.  She describes scant hemoptysis with AM cough.  She denies any melena, hematochezia, hematuria or vaginal bleeding.  Exam is stable.  Plan: 1.   Review labs from 12/18/2019. 2.Iron deficiency anemia Hematocrit 41.9.  Hemoglobin 13.7.  MCV 87.5 on 06/12/2019.  Ferritin 20 with an iron saturation of 13% and a TIBC 484 (high).  Hematocrit 40.0.  Hemoglobin 13.1.  MCV 89.5 on 12/18/2019.   Ferritin 29. Urinalysis revealed no hematuria.               She notes hemorrhoidal bleeding only. She had an inconclusive capsule endoscopy because the capsule did not enter the small bowel.    She has a follow up appointment with GI on 01/01/2020. Ferritin goal is 100.  Patient treated if ferritin< 30.    Venofer today. 3.   Hemoptysis  Volume appears scant and is likely due to irritation.  Patient notes extensive work-up by ENT and GI.  Chest CT angiogram on 10/02/2019 revealed no evidence of pulmonary embolism or acute cardiopulmonary abnormality. 4.   RTC in 3 months for labs (CBC, ferritin).   5.   RTC in 6 months for MD assessment, labs (CBC with diff, ferritin- day before) and +/- Venofer.  I discussed the assessment and treatment plan with the patient.  The patient was provided an opportunity to ask questions and all were answered.  The patient agreed with the plan and demonstrated an understanding of the instructions.  The patient was advised to call back if the symptoms worsen or if the condition fails to improve as anticipated.  I provided 15 minutes of face-to-face time during this this encounter and > 50% was spent counseling as documented under my assessment and plan.  An additional 5-6 minutes were spent reviewing her chart (Epic and Care Everywhere) including notes, labs, and imaging studies.    Christine Asal, MD, PhD    12/19/2019, 2:19 PM  I, Mirian Mo Tufford, am acting as a Education administrator for Christine Asal, MD.  I, Homer Mike Gip, MD, have reviewed the above documentation for accuracy and completeness, and I agree with the above.

## 2019-12-19 ENCOUNTER — Inpatient Hospital Stay: Payer: Medicare Other

## 2019-12-19 ENCOUNTER — Encounter: Payer: Self-pay | Admitting: Hematology and Oncology

## 2019-12-19 ENCOUNTER — Inpatient Hospital Stay (HOSPITAL_BASED_OUTPATIENT_CLINIC_OR_DEPARTMENT_OTHER): Payer: Medicare Other | Admitting: Hematology and Oncology

## 2019-12-19 VITALS — BP 107/76 | HR 86 | Temp 95.7°F | Resp 18 | Wt 244.7 lb

## 2019-12-19 VITALS — BP 100/69 | HR 77 | Temp 96.0°F | Resp 18

## 2019-12-19 DIAGNOSIS — D509 Iron deficiency anemia, unspecified: Secondary | ICD-10-CM

## 2019-12-19 DIAGNOSIS — R042 Hemoptysis: Secondary | ICD-10-CM | POA: Diagnosis not present

## 2019-12-19 LAB — FERRITIN: Ferritin: 29 ng/mL (ref 11–307)

## 2019-12-19 MED ORDER — SODIUM CHLORIDE 0.9 % IV SOLN: 200.0000 mg | Freq: Once | INTRAVENOUS | Status: DC

## 2019-12-19 MED ORDER — IRON SUCROSE 20 MG/ML IV SOLN
200.0000 mg | Freq: Once | INTRAVENOUS | Status: AC
  Administered 2019-12-19: 200 mg via INTRAVENOUS

## 2019-12-19 MED ORDER — SODIUM CHLORIDE 0.9 % IV SOLN
Freq: Once | INTRAVENOUS | Status: AC
  Filled 2019-12-19: qty 250

## 2019-12-19 NOTE — Progress Notes (Signed)
Patient states that she is still coughing up blood. She has back pain and nausea. She is trying to get her blood sugar under control for back surgery.

## 2020-01-01 ENCOUNTER — Ambulatory Visit: Payer: Medicare Other | Admitting: Gastroenterology

## 2020-01-02 ENCOUNTER — Ambulatory Visit: Payer: Medicare Other | Admitting: Gastroenterology

## 2020-01-22 ENCOUNTER — Other Ambulatory Visit: Payer: Self-pay

## 2020-01-22 ENCOUNTER — Encounter: Payer: Self-pay | Admitting: Gastroenterology

## 2020-01-22 ENCOUNTER — Ambulatory Visit (INDEPENDENT_AMBULATORY_CARE_PROVIDER_SITE_OTHER): Payer: Medicare Other | Admitting: Gastroenterology

## 2020-01-22 ENCOUNTER — Telehealth: Payer: Self-pay | Admitting: Adult Health

## 2020-01-22 VITALS — BP 127/84 | HR 87 | Temp 98.0°F | Ht 66.0 in | Wt 247.4 lb

## 2020-01-22 DIAGNOSIS — R748 Abnormal levels of other serum enzymes: Secondary | ICD-10-CM

## 2020-01-22 DIAGNOSIS — D509 Iron deficiency anemia, unspecified: Secondary | ICD-10-CM | POA: Diagnosis not present

## 2020-01-22 DIAGNOSIS — R042 Hemoptysis: Secondary | ICD-10-CM

## 2020-01-22 NOTE — Telephone Encounter (Signed)
CT chest was negative for any source.

## 2020-01-22 NOTE — Telephone Encounter (Signed)
Message from GI , patient continues to have blood tinged mucus . H/H is stable  She is a never smoker . On Pradaxa  Refer to PCP to discuss change ACE inhibitor to help with cough  CT chest neg for PE and +clear lungs 09/2019 .  Needs ov with Dr. Patsey Berthold or Mortimer Fries as may Bronchoscopy to visualize the airway   Please contact office for sooner follow up if symptoms do not improve or worsen or seek emergency care   Please call patient for above.

## 2020-01-22 NOTE — Telephone Encounter (Signed)
I had also recommended switch from Pradaxa to Eliquis.  I have had patients bleed on Pradaxa but not on Eliquis.  She had a negative exam and her CT chest was negative for sleep.  Please see my original note from 25 Sep 2019.  We can see her in follow-up.  She has had this for over 6 months.  Seem to coincide with Pradaxa therapy.  Have her be scheduled for visit next available.

## 2020-01-22 NOTE — Telephone Encounter (Signed)
I have called the pt to get scheduled for appt and had to [facility]

## 2020-01-22 NOTE — Progress Notes (Signed)
Christine Antigua, MD [address]  [place]  [place], [place] [address]  Main: [phone]  Fax: [phone]   Primary Care Physician: Doughton, Latricia Heft, MD   Chief Complaint  Patient presents with  . Elevated Hepatic Enzymes    HPI: Christine Mcneil is a 54 y.o. female here for follow-up of elevated liver enzymes and iron deficiency anemia.  Patient following with hematology and anemia has completely resolved.  Patient denies any GI symptoms, but continues to report hemoptysis every morning.  Has been seeing pulmonary for this.  As of their last note that this had resolved and instructions were to follow-up back with them if symptoms resume, which they have at this time.  They were also recommending that he is in here cardiology consider discontinuing ACE inhibitor due to ongoing cough.  No nausea vomiting or hematemesis.  Previous history:  The pill camera study was inconclusive as capsule did not enter the small bowel.  Stomach was full of food.    Previous work-up for this included EGD and colonoscopy in 2019 which was unrevealing. She has never had a pill camera study.  Denies any diarrhea, blood in stool, melena, hematochezia, abdominal pain, nausea or vomiting.  Latest CBC was normal, with no anemia.  Patient following up with hematology  Patient was recently in the ER for abdominal pain and incidentally found to have elevated liver enzymes and was started on a new medication for diabetes, Ozempic at that time.  She has discontinued this since then.  No further abdominal pain at this time.  Current Outpatient Medications  Medication Sig Dispense Refill  . ACCU-CHEK AVIVA PLUS test strip     . ACCU-CHEK SOFTCLIX LANCETS lancets     . acetaZOLAMIDE (DIAMOX) 125 MG tablet Take 125 mg by mouth daily.     Marland Kitchen AIMOVIG 70 MG/ML SOAJ Inject 70 mg into the skin every 28 (twenty-eight) days.    Marland Kitchen albuterol (VENTOLIN HFA) 108 (90 Base) MCG/ACT inhaler Inhale 2 puffs into the  lungs every 6 (six) hours as needed for wheezing or shortness of breath. 1 g 0  . ARIPiprazole (ABILIFY) 5 MG tablet Take 1 tablet (5 mg total) by mouth daily. 90 tablet 1  . aspirin-acetaminophen-caffeine (EXCEDRIN MIGRAINE) 250-250-65 MG tablet Take 2 tablets by mouth every 8 (eight) hours as needed for headache or migraine.     Marland Kitchen atorvastatin (LIPITOR) 80 MG tablet Take 80 mg by mouth daily at 6 PM.    . baclofen (LIORESAL) 10 MG tablet TAKE 1 TABLET BY MOUTH ONCE DAILY AS NEEDED FOR UP TO 30 DAYS. MAY TAKE 2 TABLETS IF NEEDED    . Blood Glucose Monitoring Suppl (ACCU-CHEK AVIVA PLUS) w/Device KIT     . Carboxymethylcellulose Sod PF (REFRESH PLUS) 0.5 % SOLN Apply to eye.    . Cholecalciferol (VITAMIN D3) 1000 units CAPS Take 1 capsule by mouth daily.    . clotrimazole (LOTRIMIN) 1 % cream     . cyclobenzaprine (FLEXERIL) 5 MG tablet Take 5 mg by mouth 3 (three) times daily as needed.     . dabigatran (PRADAXA) 150 MG CAPS capsule Take 150 mg by mouth 2 (two) times daily.    Marland Kitchen diltiazem (CARDIZEM CD) 240 MG 24 hr capsule Take by mouth. Take 1 capsule by mouth once daily    . DULoxetine (CYMBALTA) 30 MG capsule Take 1 capsule (30 mg total) by mouth daily. To be combined with 60 mg 90 capsule 0  .  DULoxetine (CYMBALTA) 60 MG capsule Take 1 capsule (60 mg total) by mouth daily. To be combined with 30 mg 90 capsule 0  . esomeprazole (NEXIUM) 40 MG capsule Take 40 mg by mouth daily.    . famotidine (PEPCID) 40 MG tablet Take 40 mg by mouth 2 (two) times daily.    . ferrous sulfate 325 (65 FE) MG tablet Take by mouth. Take 325 mg daily with breakfast    . fluticasone (FLONASE) 50 MCG/ACT nasal spray Place 2 sprays into both nostrils as needed.     . furosemide (LASIX) 20 MG tablet Take by mouth.    . hydrOXYzine (VISTARIL) 25 MG capsule TAKE (1) CAPSULE BY MOUTH TWICE DAILY ASNEEDED FOR SEVERA ANXIETY ATTACKS 180 capsule 0  . Insulin Degludec (TRESIBA FLEXTOUCH) 200 UNIT/ML SOPN Inject into the  skin.    Marland Kitchen lamoTRIgine (LAMICTAL) 100 MG tablet Take 1 tablet (100 mg total) by mouth daily. To be combined with 25 mg 90 tablet 0  . lamoTRIgine (LAMICTAL) 25 MG tablet TAKE ONE TABLET BY MOUTH DAILY. TO BE COMBINED WITH 100 MG. 90 tablet 0  . levothyroxine (SYNTHROID, LEVOTHROID) 100 MCG tablet Take 100 mcg by mouth daily.    Marland Kitchen loratadine (CLARITIN) 10 MG tablet Take 10 mg by mouth daily as needed for allergies.    . Magnesium (V-R MAGNESIUM) 250 MG TABS Take by mouth.    . Magnesium 250 MG TABS Take 1 tablet by mouth daily.     . Melatonin 10 MG TABS Take 2 tablets by mouth at bedtime.    . metFORMIN (GLUCOPHAGE-XR) 500 MG 24 hr tablet Take 500 mg by mouth 2 (two) times daily.     . naloxone (NARCAN) nasal spray 4 mg/0.1 mL 1 spray into nose for opioid overdose. If needed, may repeat spray in 2-3 min    . NUCYNTA ER 100 MG 12 hr tablet Take 1 tablet by mouth 2 (two) times daily.    Marland Kitchen nystatin (MYCOSTATIN) 100000 UNIT/ML suspension Take 5 mLs by mouth 4 (four) times daily.    Marland Kitchen omeprazole (PRILOSEC) 20 MG capsule TAKE (1) CAPSULE BY MOUTH EVERY DAY 30 capsule 0  . ondansetron (ZOFRAN ODT) 4 MG disintegrating tablet Take 1 tablet (4 mg total) by mouth every 8 (eight) hours as needed for nausea or vomiting. 12 tablet 0  . ondansetron (ZOFRAN) 4 MG tablet TAKE (1) TABLET BY MOUTH EVERY 8 HOURS AS NEEDED FOR NAUSEA AND VOMITING    . OZEMPIC, 0.25 OR 0.5 MG/DOSE, 2 MG/1.5ML SOPN Inject into the skin.    . pioglitazone (ACTOS) 15 MG tablet Take by mouth. Take 1 tablet by mouth once daily    . quinapril (ACCUPRIL) 10 MG tablet Take 10 mg by mouth daily.    . Rimegepant Sulfate (NURTEC) 75 MG TBDP Take 1 tablet by mouth as needed.     . traZODone (DESYREL) 100 MG tablet Take 1.5-2 tablets (150-200 mg total) by mouth at bedtime as needed for sleep. 180 tablet 0  . TRESIBA FLEXTOUCH 200 UNIT/ML SOPN Inject 32 Units into the skin at bedtime.      No current facility-administered medications for this  visit.    Allergies as of 01/22/2020 - Review Complete 01/22/2020  Allergen Reaction Noted  . Amoxapine Itching 09/16/2015  . Amoxicillin Itching   . Dapagliflozin Itching 11/07/2017  . Keflex [cephalexin] Itching 09/16/2015  . Mirtazapine Other (See Comments) 06/08/2019    ROS:  General: Negative for anorexia, weight loss, fever,  chills, fatigue, weakness. ENT: Negative for hoarseness, difficulty swallowing , nasal congestion. CV: Negative for chest pain, angina, palpitations, dyspnea on exertion, peripheral edema.  Respiratory: Negative for dyspnea at rest, dyspnea on exertion, cough, sputum, wheezing.  GI: See history of present illness. GU:  Negative for dysuria, hematuria, urinary incontinence, urinary frequency, nocturnal urination.  Endo: Negative for unusual weight change.    Physical Examination:   BP 127/84   Pulse 87   Temp 98 F (36.7 C) (Oral)   Ht _0  (1.676 m)   Wt 247 lb 6.4 oz (112.2 kg)   BMI 39.93 kg/m   General: Well-nourished, well-developed in no acute distress.  Eyes: No icterus. Conjunctivae pink. Mouth: Oropharyngeal mucosa moist and pink , no lesions erythema or exudate. Neck: Supple, Trachea midline Abdomen: Bowel sounds are normal, nontender, nondistended, no hepatosplenomegaly or masses, no abdominal bruits or hernia , no rebound or guarding.   Extremities: No lower extremity edema. No clubbing or deformities. Neuro: Alert and oriented x 3.  Grossly intact. Skin: Warm and dry, no jaundice.   Psych: Alert and cooperative, normal mood and affect.   Labs: CMP     Component Value Date/Time   NA 138 09/22/2019 2251   NA 138 03/22/2014 1659   K 4.0 09/22/2019 2251   K 3.7 03/22/2014 1659   CL 100 09/22/2019 2251   CL 104 03/22/2014 1659   CO2 26 09/22/2019 2251   CO2 23 03/22/2014 1659   GLUCOSE 141 (H) 09/22/2019 2251   GLUCOSE 156 (H) 03/22/2014 1659   BUN 10 09/25/2019 1015   BUN 7 03/22/2014 1659   CREATININE 1.02 (H) 09/25/2019  1015   CREATININE 0.98 03/22/2014 1659   CALCIUM 10.3 09/22/2019 2251   CALCIUM 9.0 03/22/2014 1659   PROT 7.5 11/08/2019 1518   PROT 7.4 10/31/2019 1545   PROT 8.8 (H) 03/22/2014 1659   ALBUMIN 4.1 11/08/2019 1518   ALBUMIN 4.4 10/31/2019 1545   ALBUMIN 4.5 03/22/2014 1659   AST 41 11/08/2019 1518   AST 34 03/22/2014 1659   ALT 44 11/08/2019 1518   ALT 37 03/22/2014 1659   ALKPHOS 98 11/08/2019 1518   ALKPHOS 147 (H) 03/22/2014 1659   BILITOT 0.2 (L) 11/08/2019 1518   BILITOT 0.3 10/31/2019 1545   BILITOT 0.3 03/22/2014 1659   GFRNONAA >60 09/25/2019 1015   GFRNONAA >60 03/22/2014 1659   GFRNONAA >60 09/15/2013 0131   GFRAA >60 09/25/2019 1015   GFRAA >60 03/22/2014 1659   GFRAA >60 09/15/2013 0131   Lab Results  Component Value Date   WBC 6.1 12/18/2019   HGB 13.1 12/18/2019   HCT 40.0 12/18/2019   MCV 89.5 12/18/2019   PLT 198 12/18/2019    Imaging Studies: No results found.  Assessment and Plan:   Christine Mcneil is a 54 y.o. y/o female here for follow-up of elevated liver enzymes and iron deficiency anemia  Elevated liver enzymes are now completely normal They were due to drug-induced liver injury have now completely normal as of June 2021 labs  Anemia resolved as well If anemia reoccurs, can consider repeat capsule study, but not needed at this time  Have messaged her pulmonary team, tammy parrett,  about her hemoptysis.  I have also asked the patient to follow-up with her in this regard.  She does not have any hematemesis or any reason for repeat upper endoscopy at this time   Dr Christine Mcneil

## 2020-01-23 NOTE — Telephone Encounter (Signed)
Pt has been scheduled for an appt with TP 9/8. Nothing further needed.

## 2020-01-30 ENCOUNTER — Ambulatory Visit
Admission: RE | Admit: 2020-01-30 | Discharge: 2020-01-30 | Disposition: A | Discharge status: Home / Self Care | Payer: Medicare Other | Attending: Adult Health | Admitting: Adult Health

## 2020-01-30 ENCOUNTER — Encounter: Payer: Self-pay | Admitting: Adult Health

## 2020-01-30 ENCOUNTER — Other Ambulatory Visit: Payer: Self-pay

## 2020-01-30 ENCOUNTER — Ambulatory Visit (INDEPENDENT_AMBULATORY_CARE_PROVIDER_SITE_OTHER): Payer: Medicare Other | Admitting: Adult Health

## 2020-01-30 ENCOUNTER — Other Ambulatory Visit
Admission: RE | Admit: 2020-01-30 | Discharge: 2020-01-30 | Disposition: A | Discharge status: Home / Self Care | Payer: Medicare Other | Source: Home / Self Care | Attending: Adult Health | Admitting: Adult Health

## 2020-01-30 ENCOUNTER — Ambulatory Visit
Admission: RE | Admit: 2020-01-30 | Discharge: 2020-01-30 | Disposition: A | Discharge status: Home / Self Care | Payer: Medicare Other | Source: Ambulatory Visit | Attending: Adult Health | Admitting: Adult Health

## 2020-01-30 VITALS — BP 100/70 | HR 83 | Temp 97.3°F | Ht 66.0 in | Wt 244.0 lb

## 2020-01-30 DIAGNOSIS — R042 Hemoptysis: Secondary | ICD-10-CM | POA: Diagnosis not present

## 2020-01-30 DIAGNOSIS — R05 Cough: Secondary | ICD-10-CM | POA: Diagnosis not present

## 2020-01-30 DIAGNOSIS — R053 Chronic cough: Secondary | ICD-10-CM

## 2020-01-30 LAB — CBC
HCT: 36.3 % (ref 36.0–46.0)
Hemoglobin: 12.2 g/dL (ref 12.0–15.0)
MCH: 29.8 pg (ref 26.0–34.0)
MCHC: 33.6 g/dL (ref 30.0–36.0)
MCV: 88.5 fL (ref 80.0–100.0)
Platelets: 186 10*3/uL (ref 150–400)
RBC: 4.1 MIL/uL (ref 3.87–5.11)
RDW: 13.3 % (ref 11.5–15.5)
WBC: 6 10*3/uL (ref 4.0–10.5)
nRBC: 0 % (ref 0.0–0.2)

## 2020-01-30 NOTE — Assessment & Plan Note (Signed)
Chronic hemoptysis questionable etiology.  Suspect Pradaxa is contributing.  Also ongoing cough with underlying ACE inhibitor.  Previous CT chest showed no acute abnormalities.  Patient is a never smoker.  For now we will try to treat cough and have her discuss with her neurologist regarding Pradaxa to see if this can be changed to another options such as Eliquis which has less likelihood for bleeding.  We will recheck a CBC as previous CBC showed normal hemoglobin and platelets. Chest x-ray today  Plan  Patient Instructions  Chest xray and labs today .  Begin Delsym 2 tsp Twice daily  As needed  Cough  Discuss with primary provider or cardiology that Accupril may aggravate your cough and may need to be changed.  Follow up with Neurology regarding pradaxa and ongoing bloody mucus.  If bleeding increases or does not resolve in next 1-2 weeks call back  If worse got to ER .  Follow up in 4 weeks with Dr. Patsey Berthold and As needed   Please contact office for sooner follow up if symptoms do not improve or worsen or seek emergency care

## 2020-01-30 NOTE — Patient Instructions (Addendum)
Chest xray and labs today .  Begin Delsym 2 tsp Twice daily  As needed  Cough  Discuss with primary provider or cardiology that Accupril may aggravate your cough and may need to be changed.  Follow up with Neurology regarding pradaxa and ongoing bloody mucus.  If bleeding increases or does not resolve in next 1-2 weeks call back  If worse got to ER .  Follow up in 4 weeks with Dr. Patsey Berthold and As needed   Please contact office for sooner follow up if symptoms do not improve or worsen or seek emergency care

## 2020-01-30 NOTE — Progress Notes (Signed)
_0  ID: Epifania Gore, female    DOB: February 10, 1966, 54 y.o.   MRN: [id number]  Chief Complaint  Patient presents with  . Follow-up    Hemoptysis     Referring provider: Doughton, Latricia Heft, MD  HPI: 54 year old female never smoker seen for pulmonary consult Sep 25, 2019 for hemoptysis. Medical history significant for A. fib, TIA on Pradaxa, pseudotumor cerebri, diabetes, clotting disorder (CMS-HCC)  Prothrombin gene mutation, Anti-cardiolipin antibody positive - IgM  Has Fibromyalgia on chronic pain meds.   TEST/EVENTS :  CBC Sep 22, 2019 normal hemoglobin and platelets  01/30/2020 Follow up : Hemoptysis  Patient returns for a 44-monthfollow-up.  Patient was seen last visit for follow-up after pulmonary consult for hemoptysis.  Patient's had ongoing cough with blood-tinged mucus.  For the last several months.  She has a history of A. fib and clotting disorder and is on lifelong anticoagulation with Pradaxa.  Patient has had a previous ENT and GI work-up unrevealing for source of hemoptysis.  CT chest showed clear lungs no sign of pulmonary embolism.  Patient says since last visit she continues to have daily cough with blood-tinged mucus mostly white-colored with no bright red blood.  Usually coughs up about 3-4 times a day.  She has no nosebleed gum bleeding or bloody stools.  Patient is on ACE inhibitor she was recommended last visit to have ACE inhibitor change due to daily cough.  She also has been recommended to discuss with her neurologist about Pradaxa . Patient denies any weight loss, abdominal pain, leg swelling. Says that her breathing is doing well.  Denies any wheezing or fever.   Allergies  Allergen Reactions  . Amoxapine Itching  . Amoxicillin Itching  . Dapagliflozin Itching    Other reaction(s): Other (See Comments) Thrush & vaginal itching  . Keflex [Cephalexin] Itching  . Mirtazapine Other (See Comments)    Pt states felling "like my body is outside of me."     Immunization History  Administered Date(s) Administered  . Influenza,inj,Quad PF,6+ Mos 05/15/2015, 02/15/2018, 01/30/2019  . Influenza-Unspecified 04/08/2013  . PFIZER SARS-COV-2 Vaccination 05/30/2019, 06/20/2019  . PPD Test 01/30/2019  . Pneumococcal Polysaccharide-23 06/21/2017  . Zoster Recombinat (Shingrix) 04/17/2018, 06/23/2018    Past Medical History:  Diagnosis Date  . Abdominal pain   . Anxiety and depression   . Atypical facial pain   . Bilateral occipital neuralgia   . Cervical spondylosis without myelopathy   . Chronic daily headache   . Chronic pain   . Chronic pelvic pain in female   . Chronic thoracic back pain   . Depression   . Diabetes mellitus without complication ([place]   . Diabetes mellitus, type II ([place]   . Dysphagia   . Dysrhythmia   . Fibromyalgia   . Hyperlipidemia   . Hypertension   . Insomnia   . Iron deficiency anemia   . Left hip pain   . Low back pain   . Migraines   . Numbness   . Optic neuropathy   . OSA (obstructive sleep apnea)   . Polyarthralgia   . Primary osteoarthritis of both hips   . Prothrombin gene mutation ([place]   . Pseudotumor cerebri   . Rectal bleeding   . Right shoulder pain   . Rotator cuff syndrome   . Stroke ([place]   . Thyroid disease   . TIA (transient ischemic attack) 05/27/2017    Tobacco History: Social History   Tobacco Use  Smoking  Status Never Smoker  Smokeless Tobacco Never Used   Counseling given: Not Answered   Outpatient Medications Prior to Visit  Medication Sig Dispense Refill  . ACCU-CHEK AVIVA PLUS test strip     . ACCU-CHEK SOFTCLIX LANCETS lancets     . acetaZOLAMIDE (DIAMOX) 125 MG tablet Take 125 mg by mouth daily.     Marland Kitchen AIMOVIG 70 MG/ML SOAJ Inject 70 mg into the skin every 28 (twenty-eight) days.    Marland Kitchen albuterol (VENTOLIN HFA) 108 (90 Base) MCG/ACT inhaler Inhale 2 puffs into the lungs every 6 (six) hours as needed for wheezing or shortness of breath. 1 g 0  . ARIPiprazole  (ABILIFY) 5 MG tablet Take 1 tablet (5 mg total) by mouth daily. 90 tablet 1  . aspirin-acetaminophen-caffeine (EXCEDRIN MIGRAINE) 250-250-65 MG tablet Take 2 tablets by mouth every 8 (eight) hours as needed for headache or migraine.     Marland Kitchen atorvastatin (LIPITOR) 80 MG tablet Take 80 mg by mouth daily at 6 PM.    . baclofen (LIORESAL) 10 MG tablet TAKE 1 TABLET BY MOUTH ONCE DAILY AS NEEDED FOR UP TO 30 DAYS. MAY TAKE 2 TABLETS IF NEEDED    . Blood Glucose Monitoring Suppl (ACCU-CHEK AVIVA PLUS) w/Device KIT     . Carboxymethylcellulose Sod PF (REFRESH PLUS) 0.5 % SOLN Apply to eye.    . Cholecalciferol (VITAMIN D3) 1000 units CAPS Take 1 capsule by mouth daily.    . clotrimazole (LOTRIMIN) 1 % cream     . cyclobenzaprine (FLEXERIL) 5 MG tablet Take 5 mg by mouth 3 (three) times daily as needed.     . DULoxetine (CYMBALTA) 30 MG capsule Take 1 capsule (30 mg total) by mouth daily. To be combined with 60 mg 90 capsule 0  . DULoxetine (CYMBALTA) 60 MG capsule Take 1 capsule (60 mg total) by mouth daily. To be combined with 30 mg 90 capsule 0  . esomeprazole (NEXIUM) 40 MG capsule Take 40 mg by mouth daily.    . famotidine (PEPCID) 40 MG tablet Take 40 mg by mouth 2 (two) times daily.    . ferrous sulfate 325 (65 FE) MG tablet Take by mouth. Take 325 mg daily with breakfast    . fluticasone (FLONASE) 50 MCG/ACT nasal spray Place 2 sprays into both nostrils as needed.     . furosemide (LASIX) 20 MG tablet Take by mouth.    . hydrOXYzine (VISTARIL) 25 MG capsule TAKE (1) CAPSULE BY MOUTH TWICE DAILY ASNEEDED FOR SEVERA ANXIETY ATTACKS 180 capsule 0  . Insulin Degludec (TRESIBA FLEXTOUCH) 200 UNIT/ML SOPN Inject into the skin.    Marland Kitchen lamoTRIgine (LAMICTAL) 100 MG tablet Take 1 tablet (100 mg total) by mouth daily. To be combined with 25 mg 90 tablet 0  . lamoTRIgine (LAMICTAL) 25 MG tablet TAKE ONE TABLET BY MOUTH DAILY. TO BE COMBINED WITH 100 MG. 90 tablet 0  . levothyroxine (SYNTHROID, LEVOTHROID) 100  MCG tablet Take 100 mcg by mouth daily.    Marland Kitchen loratadine (CLARITIN) 10 MG tablet Take 10 mg by mouth daily as needed for allergies.    . Magnesium (V-R MAGNESIUM) 250 MG TABS Take by mouth.    . Magnesium 250 MG TABS Take 1 tablet by mouth daily.     . Melatonin 10 MG TABS Take 2 tablets by mouth at bedtime.    . metFORMIN (GLUCOPHAGE-XR) 500 MG 24 hr tablet Take 500 mg by mouth 2 (two) times daily.     . naloxone (  NARCAN) nasal spray 4 mg/0.1 mL 1 spray into nose for opioid overdose. If needed, may repeat spray in 2-3 min    . NUCYNTA ER 100 MG 12 hr tablet Take 1 tablet by mouth 2 (two) times daily.    Marland Kitchen nystatin (MYCOSTATIN) 100000 UNIT/ML suspension Take 5 mLs by mouth 4 (four) times daily.    Marland Kitchen omeprazole (PRILOSEC) 20 MG capsule TAKE (1) CAPSULE BY MOUTH EVERY DAY 30 capsule 0  . ondansetron (ZOFRAN ODT) 4 MG disintegrating tablet Take 1 tablet (4 mg total) by mouth every 8 (eight) hours as needed for nausea or vomiting. 12 tablet 0  . ondansetron (ZOFRAN) 4 MG tablet TAKE (1) TABLET BY MOUTH EVERY 8 HOURS AS NEEDED FOR NAUSEA AND VOMITING    . OZEMPIC, 0.25 OR 0.5 MG/DOSE, 2 MG/1.5ML SOPN Inject into the skin.    Marland Kitchen quinapril (ACCUPRIL) 10 MG tablet Take 10 mg by mouth daily.    . Rimegepant Sulfate (NURTEC) 75 MG TBDP Take 1 tablet by mouth as needed.     . traZODone (DESYREL) 100 MG tablet Take 1.5-2 tablets (150-200 mg total) by mouth at bedtime as needed for sleep. 180 tablet 0  . TRESIBA FLEXTOUCH 200 UNIT/ML SOPN Inject 32 Units into the skin at bedtime.     . dabigatran (PRADAXA) 150 MG CAPS capsule Take 150 mg by mouth 2 (two) times daily.    Marland Kitchen diltiazem (CARDIZEM CD) 240 MG 24 hr capsule Take by mouth. Take 1 capsule by mouth once daily    . pioglitazone (ACTOS) 15 MG tablet Take by mouth. Take 1 tablet by mouth once daily     No facility-administered medications prior to visit.     Review of Systems:   Constitutional:   No  weight loss, night sweats,  Fevers, chills,    +fatigue, or  lassitude.  HEENT:   No headaches,  Difficulty swallowing,  Tooth/dental problems, or  Sore throat,                No sneezing, itching, ear ache, nasal congestion, post nasal drip,   CV:  No chest pain,  Orthopnea, PND, swelling in lower extremities, anasarca, dizziness, palpitations, syncope.   GI  No heartburn, indigestion, abdominal pain, nausea, vomiting, diarrhea, change in bowel habits, loss of appetite, bloody stools.   Resp:  .  No chest wall deformity  Skin: no rash or lesions.  GU: no dysuria, change in color of urine, no urgency or frequency.  No flank pain, no hematuria   MS:  No joint pain or swelling.  No decreased range of motion.  No back pain.    Physical Exam  BP 100/70 (BP Location: Left Arm, Patient Position: Sitting, Cuff Size: Normal)   Pulse 83   Temp (!) 97.3 F (36.3 C) (Temporal)   Ht _0  (1.676 m)   Wt 244 lb (110.7 kg)   SpO2 96%   BMI 39.38 kg/m   GEN: A/Ox3; pleasant , NAD, well nourished    HEENT:  [place]/AT,  NOSE-clear, THROAT-clear, no lesions, no postnasal drip or exudate noted.   NECK:  Supple w/ fair ROM; no JVD; normal carotid impulses w/o bruits; no thyromegaly or nodules palpated; no lymphadenopathy.    RESP  Clear  P & A; w/o, wheezes/ rales/ or rhonchi. no accessory muscle use, no dullness to percussion  CARD:  RRR, no m/r/g, no peripheral edema, pulses intact, no cyanosis or clubbing.  GI:   Soft & nt; nml  bowel sounds; no organomegaly or masses detected.   Musco: Warm bil, no deformities or joint swelling noted.   Neuro: alert, no focal deficits noted.    Skin: Warm, no lesions or rashes    Lab Results:  CBC    Component Value Date/Time   WBC 6.0 01/30/2020 1657   RBC 4.10 01/30/2020 1657   HGB 12.2 01/30/2020 1657   HGB 10.5 (L) 03/22/2014 1659   HCT 36.3 01/30/2020 1657   HCT 35.5 03/22/2014 1659   PLT 186 01/30/2020 1657   PLT 330 03/22/2014 1659   MCV 88.5 01/30/2020 1657   MCV 74 (L)  03/22/2014 1659   MCH 29.8 01/30/2020 1657   MCHC 33.6 01/30/2020 1657   RDW 13.3 01/30/2020 1657   RDW 17.2 (H) 03/22/2014 1659   LYMPHSABS 2.2 12/18/2019 1305   LYMPHSABS 3.6 09/15/2013 0131   MONOABS 0.3 12/18/2019 1305   MONOABS 0.7 09/15/2013 0131   EOSABS 0.2 12/18/2019 1305   EOSABS 0.1 09/15/2013 0131   BASOSABS 0.0 12/18/2019 1305   BASOSABS 0.1 09/15/2013 0131    BMET    Component Value Date/Time   NA 138 09/22/2019 2251   NA 138 03/22/2014 1659   K 4.0 09/22/2019 2251   K 3.7 03/22/2014 1659   CL 100 09/22/2019 2251   CL 104 03/22/2014 1659   CO2 26 09/22/2019 2251   CO2 23 03/22/2014 1659   GLUCOSE 141 (H) 09/22/2019 2251   GLUCOSE 156 (H) 03/22/2014 1659   BUN 10 09/25/2019 1015   BUN 7 03/22/2014 1659   CREATININE 1.02 (H) 09/25/2019 1015   CREATININE 0.98 03/22/2014 1659   CALCIUM 10.3 09/22/2019 2251   CALCIUM 9.0 03/22/2014 1659   GFRNONAA >60 09/25/2019 1015   GFRNONAA >60 03/22/2014 1659   GFRNONAA >60 09/15/2013 0131   GFRAA >60 09/25/2019 1015   GFRAA >60 03/22/2014 1659   GFRAA >60 09/15/2013 0131    BNP    Component Value Date/Time   BNP 40.0 01/03/2017 1430    ProBNP No results found for: PROBNP  Imaging: DG Chest 2 View  Result Date: 01/30/2020 CLINICAL DATA:  Coughing up blood for 2 months.  Hemoptysis. EXAM: CHEST - 2 VIEW COMPARISON:  Chest CTA 10/02/2019 FINDINGS: Prominent heart size accentuated by mediastinal lipomatosis, stable from prior CT. No acute or focal airspace disease. No pulmonary edema, pleural effusion, or pneumothorax. Mild degenerative change in the thoracic spine without acute osseous abnormality. Stimulator courses in the soft tissues posteriorly, tip in battery pack not included in the field of view. IMPRESSION: No acute chest findings or explanation for hemoptysis. Electronically Signed   By: Keith Rake M.D.   On: 01/30/2020 17:37    iron sucrose (VENOFER) injection 200 mg    Date Action Dose Route User     12/19/2019 1446 Given  Intravenous Aleen Campi, RN    0.9 %  sodium chloride infusion    Date Action Dose Route User   12/19/2019 1500 Rate/Dose Verify  (none) Aleen Campi, RN   12/19/2019 1447 Rate/Dose Change  (none) Aleen Campi, RN   12/19/2019 1444 New Bag/Given  Intravenous Aleen Campi, RN      No flowsheet data found.  No results found for: NITRICOXIDE      Assessment & Plan:   Chronic cough Chronic cough recommend change of her ACE inhibitor to see if this will help.  Plan  Patient Instructions  Chest xray and labs today .  Begin  Delsym 2 tsp Twice daily  As needed  Cough  Discuss with primary provider or cardiology that Accupril may aggravate your cough and may need to be changed.  Follow up with Neurology regarding pradaxa and ongoing bloody mucus.  If bleeding increases or does not resolve in next 1-2 weeks call back  If worse got to ER .  Follow up in 4 weeks with Dr. Patsey Berthold and As needed   Please contact office for sooner follow up if symptoms do not improve or worsen or seek emergency care        Hemoptysis Chronic hemoptysis questionable etiology.  Suspect Pradaxa is contributing.  Also ongoing cough with underlying ACE inhibitor.  Previous CT chest showed no acute abnormalities.  Patient is a never smoker.  For now we will try to treat cough and have her discuss with her neurologist regarding Pradaxa to see if this can be changed to another options such as Eliquis which has less likelihood for bleeding.  We will recheck a CBC as previous CBC showed normal hemoglobin and platelets. Chest x-ray today  Plan  Patient Instructions  Chest xray and labs today .  Begin Delsym 2 tsp Twice daily  As needed  Cough  Discuss with primary provider or cardiology that Accupril may aggravate your cough and may need to be changed.  Follow up with Neurology regarding pradaxa and ongoing bloody mucus.  If bleeding increases or does not resolve in next 1-2 weeks  call back  If worse got to ER .  Follow up in 4 weeks with Dr. Patsey Berthold and As needed   Please contact office for sooner follow up if symptoms do not improve or worsen or seek emergency care           Rexene Edison, NP 01/30/2020

## 2020-01-30 NOTE — Assessment & Plan Note (Signed)
Chronic cough recommend change of her ACE inhibitor to see if this will help.  Plan  Patient Instructions  Chest xray and labs today .  Begin Delsym 2 tsp Twice daily  As needed  Cough  Discuss with primary provider or cardiology that Accupril may aggravate your cough and may need to be changed.  Follow up with Neurology regarding pradaxa and ongoing bloody mucus.  If bleeding increases or does not resolve in next 1-2 weeks call back  If worse got to ER .  Follow up in 4 weeks with Dr. Patsey Berthold and As needed   Please contact office for sooner follow up if symptoms do not improve or worsen or seek emergency care

## 2020-02-04 ENCOUNTER — Telehealth: Payer: Self-pay | Admitting: Pulmonary Disease

## 2020-02-04 NOTE — Telephone Encounter (Signed)
Called and spoke with patient and provided CXR results per Rexene Edison NP.  She verbalized understanding.  Nothing further needed.

## 2020-02-06 NOTE — Progress Notes (Signed)
Unable to reach patient, letter sent with result notes from Rexene Edison, NP. Due to condition and two calls letter sent prior to third call.

## 2020-02-08 NOTE — Progress Notes (Signed)
Agree with the details of the visit as noted by Tammy Parrett, NP.  C. Laura Kyrsten Deleeuw, MD [facility] 

## 2020-02-20 NOTE — Telephone Encounter (Signed)
Letter returned, incorrect address.

## 2020-02-21 ENCOUNTER — Other Ambulatory Visit: Payer: Self-pay | Admitting: Family Medicine

## 2020-02-21 DIAGNOSIS — Z1231 Encounter for screening mammogram for malignant neoplasm of breast: Secondary | ICD-10-CM

## 2020-03-03 ENCOUNTER — Encounter: Payer: Self-pay | Admitting: Pulmonary Disease

## 2020-03-03 ENCOUNTER — Other Ambulatory Visit: Payer: Self-pay

## 2020-03-03 ENCOUNTER — Ambulatory Visit (INDEPENDENT_AMBULATORY_CARE_PROVIDER_SITE_OTHER): Payer: Medicare Other | Admitting: Pulmonary Disease

## 2020-03-03 VITALS — BP 114/72 | HR 77 | Temp 97.1°F | Ht 66.0 in | Wt 246.4 lb

## 2020-03-03 DIAGNOSIS — R042 Hemoptysis: Secondary | ICD-10-CM

## 2020-03-03 DIAGNOSIS — D6852 Prothrombin gene mutation: Secondary | ICD-10-CM

## 2020-03-03 DIAGNOSIS — K068 Other specified disorders of gingiva and edentulous alveolar ridge: Secondary | ICD-10-CM

## 2020-03-03 DIAGNOSIS — Z86718 Personal history of other venous thrombosis and embolism: Secondary | ICD-10-CM | POA: Diagnosis not present

## 2020-03-03 NOTE — Progress Notes (Signed)
Subjective:    Patient ID: Christine Mcneil, female    DOB: 01-12-1966, 54 y.o.   MRN: [id number] Requesting MD/Service: Dr. Archie Balboa, GI service Date of initial consultation: 25 Sep 2019 Reason for consultation: Presumed hemoptysis   PT PROFILE: 54 year old female never smoker seen for pulmonary consult Sep 25, 2019 for presumed hemoptysis. Medical history significant for: A. 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 10/02/2019: CT angio chest: No lung parenchymal abnormalities.  No PE.  No adenopathy or other mediastinal finding.  Normal CT angio. 01/30/2020: Chest x-ray PA and lateral: Nothing acute.  Implantable stimulator in place.  HPI Patient is a 54 year old lifelong never smoker with a history of prothrombin gene mutation on chronic anticoagulation with Pradaxa.  First evaluated here in May 2021 after a 72-monthhistory of "spitting up blood".  Specifically patient does not endorse having cough prior to the blood appearing spontaneously in her mouth.  She has had negative GI and ENT work-ups.  No evidence of epistaxis.  She has had negative CT chest and negative chest x-ray x2.  Esophagram has shown spontaneous reflux to the level of mid esophagus.  The patient does not endorse any weight loss or anorexia.  No fevers, chills or sweats.  At the time of initial evaluation the bleeding had stopped for approximately 2 weeks.  Since then she has had intermittent bleeding that she notes as "blood-tinged spit".  Here recently she saw her dentist who identified gum bleeding she had both coagulated and new blood noted.  Given that her chest imaging is  normal, and that her bleeding is not preempted by cough, this is the likely culprit.  On prior evaluations we have recommended that Pradaxa be switched to apixaban.  Pradaxa is known for issues with spontaneous minor bleeding as well as major bleeding.  The patient does have a history of cerebral venous sinus thrombosis and will need chronic anticoagulation  The patient does not endorse any fevers, chills or sweats.  She is happy the source of her bleeding has been found.  She continues to endorse that she does not have cough per se.  She voices no other complaint.   Review of Systems A 10 point review of systems was performed and it is as noted above otherwise negative.  Allergies  Allergen Reactions  . Amoxapine Itching  . Amoxicillin Itching  . Dapagliflozin Itching    Other reaction(s): Other (See Comments) Thrush & vaginal itching  . Keflex [Cephalexin] Itching  . Mirtazapine Other (See Comments)    Pt states felling "like my body is outside of me."   Current Meds  Medication Sig  . ACCU-CHEK AVIVA PLUS test strip   . ACCU-CHEK SOFTCLIX LANCETS lancets   . acetaZOLAMIDE (DIAMOX) 125 MG tablet Take 125 mg by mouth daily.   .Marland KitchenAIMOVIG 70 MG/ML SOAJ Inject 70 mg into the skin every 28 (twenty-eight) days.  .Marland Kitchenalbuterol (VENTOLIN HFA) 108 (90 Base) MCG/ACT inhaler Inhale 2 puffs into the lungs every 6 (six) hours as needed for wheezing or shortness of breath.  . ARIPiprazole (ABILIFY) 5 MG tablet Take 1 tablet (5 mg total) by mouth daily.  .Marland Kitchenaspirin-acetaminophen-caffeine (EXCEDRIN MIGRAINE) 250-250-65 MG tablet Take 2 tablets by mouth  every 8 (eight) hours as needed for headache or migraine.   Marland Kitchen atorvastatin (LIPITOR) 80 MG tablet Take 80 mg by mouth daily at 6 PM.  . baclofen (LIORESAL) 10 MG tablet TAKE 1 TABLET BY MOUTH ONCE DAILY AS NEEDED FOR UP TO 30 DAYS. MAY TAKE 2 TABLETS IF NEEDED  . Blood Glucose Monitoring Suppl (ACCU-CHEK AVIVA PLUS) w/Device KIT   . Carboxymethylcellulose  Sod PF (REFRESH PLUS) 0.5 % SOLN Apply to eye.  . Cholecalciferol (VITAMIN D3) 1000 units CAPS Take 1 capsule by mouth daily.  . clotrimazole (LOTRIMIN) 1 % cream   . cyclobenzaprine (FLEXERIL) 5 MG tablet Take 5 mg by mouth 3 (three) times daily as needed.   . dabigatran (PRADAXA) 150 MG CAPS capsule Take 150 mg by mouth 2 (two) times daily.  . DULoxetine (CYMBALTA) 30 MG capsule Take 1 capsule (30 mg total) by mouth daily. To be combined with 60 mg  . DULoxetine (CYMBALTA) 60 MG capsule Take 1 capsule (60 mg total) by mouth daily. To be combined with 30 mg  . esomeprazole (NEXIUM) 40 MG capsule Take 40 mg by mouth daily.  . famotidine (PEPCID) 40 MG tablet Take 40 mg by mouth 2 (two) times daily.  . ferrous sulfate 325 (65 FE) MG tablet Take by mouth. Take 325 mg daily with breakfast  . fluticasone (FLONASE) 50 MCG/ACT nasal spray Place 2 sprays into both nostrils as needed.   . furosemide (LASIX) 20 MG tablet Take by mouth.  . hydrOXYzine (VISTARIL) 25 MG capsule TAKE (1) CAPSULE BY MOUTH TWICE DAILY ASNEEDED FOR SEVERA ANXIETY ATTACKS  . Insulin Degludec (TRESIBA FLEXTOUCH) 200 UNIT/ML SOPN Inject into the skin.  Marland Kitchen lamoTRIgine (LAMICTAL) 100 MG tablet Take 1 tablet (100 mg total) by mouth daily. To be combined with 25 mg  . lamoTRIgine (LAMICTAL) 25 MG tablet TAKE ONE TABLET BY MOUTH DAILY. TO BE COMBINED WITH 100 MG.  . levothyroxine (SYNTHROID, LEVOTHROID) 100 MCG tablet Take 100 mcg by mouth daily.  Marland Kitchen loratadine (CLARITIN) 10 MG tablet Take 10 mg by mouth daily as needed for allergies.  . Magnesium (V-R MAGNESIUM) 250 MG TABS Take by mouth.  . Magnesium 250 MG TABS Take 1 tablet by mouth daily.   . Melatonin 10 MG TABS Take 2 tablets by mouth at bedtime.  . metFORMIN (GLUCOPHAGE-XR) 500 MG 24 hr tablet Take 500 mg by mouth 2 (two) times daily.   . naloxone (NARCAN) nasal spray 4 mg/0.1 mL 1 spray into nose for opioid overdose. If needed, may repeat spray in 2-3 min  . NUCYNTA ER 100 MG  12 hr tablet Take 1 tablet by mouth 2 (two) times daily.  Marland Kitchen nystatin (MYCOSTATIN) 100000 UNIT/ML suspension Take 5 mLs by mouth 4 (four) times daily.  Marland Kitchen omeprazole (PRILOSEC) 20 MG capsule TAKE (1) CAPSULE BY MOUTH EVERY DAY  . ondansetron (ZOFRAN ODT) 4 MG disintegrating tablet Take 1 tablet (4 mg total) by mouth every 8 (eight) hours as needed for nausea or vomiting.  . ondansetron (ZOFRAN) 4 MG tablet TAKE (1) TABLET BY MOUTH EVERY 8 HOURS AS NEEDED FOR NAUSEA AND VOMITING  . OZEMPIC, 0.25 OR 0.5 MG/DOSE, 2 MG/1.5ML SOPN Inject into the skin.  Marland Kitchen quinapril (ACCUPRIL) 10 MG tablet Take 10 mg by mouth daily.  . Rimegepant Sulfate (NURTEC) 75 MG TBDP Take 1 tablet by mouth as needed.   . traZODone (DESYREL) 100 MG tablet Take 1.5-2 tablets (150-200 mg total) by mouth at bedtime as needed for  sleep.  . TRESIBA FLEXTOUCH 200 UNIT/ML SOPN Inject 32 Units into the skin at bedtime.      Immunization History  Administered Date(s) Administered  . Influenza,inj,Quad PF,6+ Mos 05/15/2015, 02/15/2018, 01/30/2019  . Influenza-Unspecified 04/08/2013, 02/11/2020  . PFIZER SARS-COV-2 Vaccination 05/30/2019, 06/20/2019  . PPD Test 01/30/2019  . Pneumococcal Polysaccharide-23 06/21/2017  . Zoster Recombinat (Shingrix) 04/17/2018, 06/23/2018       Objective:   Physical Exam BP 114/72 (BP Location: Left Arm, Patient Position: Sitting, Cuff Size: Normal)   Pulse 77   Temp (!) 97.1 F (36.2 C) (Temporal)   Ht 5' 6" (1.676 m)   Wt 246 lb 6.4 oz (111.8 kg)   SpO2 97%   BMI 39.77 kg/m   GENERAL: Obese woman, no acute distress, no conversational dyspnea, fully ambulatory. HEAD: Normocephalic, atraumatic.  EYES: Pupils equal, round, reactive to light.  No scleral icterus.  MOUTH: Nose/mouth/throat not examined due to masking requirements for COVID 19. NECK: Supple. No thyromegaly. No nodules. No JVD.  Trachea midline, no crepitus. PULMONARY: Lungs clear to auscultation bilaterally.  Symmetrical air  entry.  No accessory muscle use. CARDIOVASCULAR: S1 and S2. Regular rate and rhythm.  No rubs murmurs gallops heard. GASTROINTESTINAL: Obese abdomen otherwise benign. MUSCULOSKELETAL: No joint deformity, no clubbing, no edema.  NEUROLOGIC: Awake, alert, mild psychomotor retardation.  No overt focal deficits.  Speech is fluent. SKIN: Intact,warm,dry.  No overt rashes noted on limited exam. PSYCH: Flat affect.  Mild psychomotor retardation as noted above.      Assessment & Plan:     ICD-10-CM   1. Hemoptysis  R04.2    This is "pseudohemoptysis" Due to bleeding gums as per patient's dentist Patient is not coughing per se Describes "spitting up" blood  2. Prothrombin gene mutation ([employer])  D68.52    On chronic anticoagulation Highly recommend switching Pradaxa to Eliquis Pradaxa associated with spontaneous bleeding episodes  3. Bleeding gums  K06.8    Identified by the patient's dentist Likely source of "hemoptysis"  4. History of cerebral venous sinus thrombosis  Z86.718    History of cerebral venous sinus thrombosis On chronic anticoagulation as above   Discussion:  Patient continues to state that she does not have cough but that actually she spontaneously "spits up" blood.  Recently found to have bleeding gums with an area of coagulated and new blood.  Dentist recommends switching off of Pradaxa which we have also recommended on prior visits.  Recommend switching to Eliquis.  Pradaxa is associated with episodes of spontaneous bleeding minor and/or major.  The patient does not have any abnormalities noted on CT angio chest or repeated chest x-rays.  She has no history of smoking.  We will see the patient in follow-up in 3 months time hopefully after these changes have been made.  Patient is to call us prior to that time should any new difficulties arise.  Renold Don, MD [facility]   *This note was dictated using voice recognition software/Dragon.  Despite best efforts to  proofread, errors can occur which can change the meaning.  Any change was purely unintentional.

## 2020-03-03 NOTE — Patient Instructions (Signed)
I recommend switch from Pradaxa to Eliquis which has the least bleeding complications.  Would also avoid Xarelto in this situation.  Bleeding has been identified to be coming from the gums.   CT chest and chest x-rays remain negative.   We will see you in follow-up in a month's time call sooner should any new problems arise.

## 2020-03-11 ENCOUNTER — Other Ambulatory Visit: Payer: Self-pay

## 2020-03-11 ENCOUNTER — Inpatient Hospital Stay: Payer: Medicare Other | Attending: Hematology and Oncology

## 2020-03-11 DIAGNOSIS — D509 Iron deficiency anemia, unspecified: Secondary | ICD-10-CM | POA: Diagnosis not present

## 2020-03-11 DIAGNOSIS — D508 Other iron deficiency anemias: Secondary | ICD-10-CM

## 2020-03-11 LAB — CBC WITH DIFFERENTIAL/PLATELET
Abs Immature Granulocytes: 0.02 10*3/uL (ref 0.00–0.07)
Basophils Absolute: 0 10*3/uL (ref 0.0–0.1)
Basophils Relative: 0 %
Eosinophils Absolute: 0.2 10*3/uL (ref 0.0–0.5)
Eosinophils Relative: 3 %
HCT: 40.3 % (ref 36.0–46.0)
Hemoglobin: 13.1 g/dL (ref 12.0–15.0)
Immature Granulocytes: 0 %
Lymphocytes Relative: 33 %
Lymphs Abs: 2.5 10*3/uL (ref 0.7–4.0)
MCH: 29.1 pg (ref 26.0–34.0)
MCHC: 32.5 g/dL (ref 30.0–36.0)
MCV: 89.6 fL (ref 80.0–100.0)
Monocytes Absolute: 0.6 10*3/uL (ref 0.1–1.0)
Monocytes Relative: 8 %
Neutro Abs: 4.1 10*3/uL (ref 1.7–7.7)
Neutrophils Relative %: 56 %
Platelets: 215 10*3/uL (ref 150–400)
RBC: 4.5 MIL/uL (ref 3.87–5.11)
RDW: 13.1 % (ref 11.5–15.5)
WBC: 7.5 10*3/uL (ref 4.0–10.5)
nRBC: 0 % (ref 0.0–0.2)

## 2020-03-11 LAB — FERRITIN: Ferritin: 24 ng/mL (ref 11–307)

## 2020-03-14 ENCOUNTER — Telehealth: Payer: Self-pay

## 2020-03-14 NOTE — Telephone Encounter (Signed)
Left a message to inform the patient that her Ferritn was low and her hbg was normal. Per Dr Mike Gip her Ferritin need to be <30 consider Venofer x 1. I have asked for the patient to contact the office.

## 2020-03-19 ENCOUNTER — Encounter: Payer: Self-pay | Admitting: Psychiatry

## 2020-03-19 ENCOUNTER — Telehealth (INDEPENDENT_AMBULATORY_CARE_PROVIDER_SITE_OTHER): Payer: Medicare Other | Admitting: Psychiatry

## 2020-03-19 ENCOUNTER — Other Ambulatory Visit: Payer: Self-pay

## 2020-03-19 DIAGNOSIS — G4701 Insomnia due to medical condition: Secondary | ICD-10-CM

## 2020-03-19 DIAGNOSIS — F3342 Major depressive disorder, recurrent, in full remission: Secondary | ICD-10-CM

## 2020-03-19 DIAGNOSIS — F411 Generalized anxiety disorder: Secondary | ICD-10-CM

## 2020-03-19 DIAGNOSIS — F4310 Post-traumatic stress disorder, unspecified: Secondary | ICD-10-CM | POA: Diagnosis not present

## 2020-03-19 MED ORDER — DULOXETINE HCL 60 MG PO CPEP: 60.0000 mg | ORAL_CAPSULE | Freq: Every day | ORAL | 0 refills | Status: DC

## 2020-03-19 MED ORDER — LAMOTRIGINE 100 MG PO TABS: 100.0000 mg | ORAL_TABLET | Freq: Every day | ORAL | 0 refills | Status: DC

## 2020-03-19 MED ORDER — GABAPENTIN 300 MG PO CAPS: 300.0000 mg | ORAL_CAPSULE | Freq: Every day | ORAL | 0 refills | Status: DC

## 2020-03-19 MED ORDER — ARIPIPRAZOLE 5 MG PO TABS: 2.5000 mg | ORAL_TABLET | Freq: Every day | ORAL | 1 refills | Status: DC

## 2020-03-19 MED ORDER — DULOXETINE HCL 30 MG PO CPEP: 30.0000 mg | ORAL_CAPSULE | Freq: Every day | ORAL | 0 refills | Status: DC

## 2020-03-19 MED ORDER — LAMOTRIGINE 25 MG PO TABS: ORAL_TABLET | ORAL | 0 refills | Status: DC

## 2020-03-19 MED ORDER — HYDROXYZINE PAMOATE 25 MG PO CAPS: ORAL_CAPSULE | ORAL | 0 refills | Status: DC

## 2020-03-19 NOTE — Progress Notes (Signed)
Virtual Visit via Telephone Note  I connected with Christine Mcneil on 03/19/20 at  2:00 PM EDT by telephone and verified that I am speaking with the correct person using two identifiers.  Location Provider Location : [facility] Patient Location : Home  Participants: Patient , Provider    I discussed the limitations, risks, security and privacy concerns of performing an evaluation and management service by telephone and the availability of in person appointments. I also discussed with the patient that there may be a patient responsible charge related to this service. The patient expressed understanding and agreed to proceed.    I discussed the assessment and treatment plan with the patient. The patient was provided an opportunity to ask questions and all were answered. The patient agreed with the plan and demonstrated an understanding of the instructions.   The patient was advised to call back or seek an in-person evaluation if the symptoms worsen or if the condition fails to improve as anticipated.   [facility] MD OP Progress Note  03/19/2020 2:21 PM Christine Mcneil  MRN:  [id number]  Chief Complaint:  Chief Complaint    Follow-up     HPI: Christine Mcneil is a 54 year old Caucasian female, divorced, lives in [place], has a history of MDD, PTSD, GAD, insomnia, CVA, OSA, history of prothrombin gene mutation, chronic pain, migraine headaches, iron deficiency anemia was evaluated by phone today.  Patient today reports she is currently struggling with sleep problems.  She reports she struggles with neuropathy of her feet as well as some restlessness which could be contributing to her sleep issues.  She does have a neurology appointment however that is 3 months away.  She wonders whether she can be started back on gabapentin.  Patient otherwise reports mood symptoms is okay.  She does have a lot of psychosocial stressors.  She reports she has to take care of her mother who needs her help, her son who is special  needs and a friend who is coming out of a skilled nursing facility soon.  Patient however reports overall she is coping okay.  Patient denies any suicidality, homicidality or perceptual disturbances.  She is compliant on her medications and denies side effects.  Discussed reducing the dosage of Abilify now that her symptoms are more stable.  She is agreeable.  Patient denies any other concerns today.  Visit Diagnosis:    ICD-10-CM   1. MDD (major depressive disorder), recurrent, in full remission ([place])  F33.42 lamoTRIgine (LAMICTAL) 100 MG tablet    lamoTRIgine (LAMICTAL) 25 MG tablet  2. PTSD (post-traumatic stress disorder)  F43.10 ARIPiprazole (ABILIFY) 5 MG tablet    DULoxetine (CYMBALTA) 30 MG capsule    DULoxetine (CYMBALTA) 60 MG capsule    lamoTRIgine (LAMICTAL) 25 MG tablet  3. GAD (generalized anxiety disorder)  F41.1 hydrOXYzine (VISTARIL) 25 MG capsule  4. Insomnia due to medical condition  G47.01 gabapentin (NEURONTIN) 300 MG capsule   neuropathy pain    Past Psychiatric History: I have reviewed past psychiatric history from my progress note on 08/16/2017.  Past trials of Effexor, Klonopin.  Past Medical History:  Past Medical History:  Diagnosis Date  . Abdominal pain   . Anxiety and depression   . Atypical facial pain   . Bilateral occipital neuralgia   . Cervical spondylosis without myelopathy   . Chronic daily headache   . Chronic pain   . Chronic pelvic pain in female   . Chronic thoracic back pain   .  Depression   . Diabetes mellitus without complication ([place])   . Diabetes mellitus, type II ([place])   . Dysphagia   . Dysrhythmia   . Fibromyalgia   . Hyperlipidemia   . Hypertension   . Insomnia   . Iron deficiency anemia   . Left hip pain   . Low back pain   . Migraines   . Numbness   . Optic neuropathy   . OSA (obstructive sleep apnea)   . Polyarthralgia   . Primary osteoarthritis of both hips   . Prothrombin gene mutation ([place])   . Pseudotumor  cerebri   . Rectal bleeding   . Right shoulder pain   . Rotator cuff syndrome   . Stroke ([place])   . Thyroid disease   . TIA (transient ischemic attack) 05/27/2017    Past Surgical History:  Procedure Laterality Date  . ABDOMINAL HYSTERECTOMY     total  . CHOLECYSTECTOMY    . COLONOSCOPY WITH PROPOFOL N/A 03/21/2018   Procedure: COLONOSCOPY WITH PROPOFOL;  Surgeon: Lucilla Lame, MD;  Location[facility];  Service: Endoscopy;  Laterality: N/A;  . ESOPHAGOGASTRODUODENOSCOPY (EGD) WITH PROPOFOL N/A 03/21/2018   Procedure: ESOPHAGOGASTRODUODENOSCOPY (EGD) WITH PROPOFOL;  Surgeon: Lucilla Lame, MD;  Location: [facility] ENDOSCOPY;  Service: Endoscopy;  Laterality: N/A;  . GIVENS CAPSULE STUDY N/A 09/24/2019   Procedure: GIVENS CAPSULE STUDY;  Surgeon: Virgel Manifold, MD;  Location: [facility] ENDOSCOPY;  Service: Endoscopy;  Laterality: N/A;  . JOINT REPLACEMENT    . KNEE SURGERY Left   . ROTATOR CUFF REPAIR Right   . spinal neurostimulator      Family Psychiatric History: I have reviewed family psychiatric history from my progress note on 08/16/2017  Family History:  Family History  Problem Relation Age of Onset  . Diabetes Mother   . Hyperlipidemia Mother   . Hypertension Mother   . COPD Mother   . CVA Mother   . Anemia Mother   . Diabetes Father   . Hyperlipidemia Father   . Hypertension Father   . Thyroid disease Father   . Alcohol abuse Father   . Peripheral vascular disease Sister   . Hypertension Sister   . Anxiety disorder Son   . Depression Son   . Bipolar disorder Son   . Seizures Son     Social History: I have reviewed social history from my progress note on 08/16/2017 Social History   Socioeconomic History  . Marital status: Divorced    Spouse name: Not on file  . Number of children: 1  . Years of education: Not on file  . Highest education level: High school graduate  Occupational History    Comment: disablitiy  Tobacco Use  . Smoking status: Never Smoker   . Smokeless tobacco: Never Used  Vaping Use  . Vaping Use: Never used  Substance and Sexual Activity  . Alcohol use: No  . Drug use: No  . Sexual activity: Not Currently  Other Topics Concern  . Not on file  Social History Narrative  . Not on file   Social Determinants of Health   Financial Resource Strain:   . Difficulty of Paying Living Expenses: Not on file  Food Insecurity:   . Worried About Charity fundraiser in the Last Year: Not on file  . Ran Out of Food in the Last Year: Not on file  Transportation Needs:   . Lack of Transportation (Medical): Not on file  . Lack of Transportation (Non-Medical): Not on  file  Physical Activity:   . Days of Exercise per Week: Not on file  . Minutes of Exercise per Session: Not on file  Stress:   . Feeling of Stress : Not on file  Social Connections:   . Frequency of Communication with Friends and Family: Not on file  . Frequency of Social Gatherings with Friends and Family: Not on file  . Attends Religious Services: Not on file  . Active Member of Clubs or Organizations: Not on file  . Attends Archivist Meetings: Not on file  . Marital Status: Not on file    Allergies:  Allergies  Allergen Reactions  . Amoxapine Itching  . Amoxicillin Itching  . Dapagliflozin Itching    Other reaction(s): Other (See Comments) Thrush & vaginal itching  . Keflex [Cephalexin] Itching  . Mirtazapine Other (See Comments)    Pt states felling "like my body is outside of me."    Metabolic Disorder Labs: Lab Results  Component Value Date   HGBA1C 9.9 (H) 06/21/2017   MPG 237.43 06/21/2017   Lab Results  Component Value Date   PROLACTIN 5.5 09/06/2017   Lab Results  Component Value Date   CHOL 277 (H) 06/21/2017   TRIG 196 (H) 06/21/2017   HDL 64 06/21/2017   CHOLHDL 4.3 06/21/2017   VLDL 39 06/21/2017   LDLCALC 174 (H) 06/21/2017   Lab Results  Component Value Date   TSH 2.890 09/06/2017   TSH 3.176 01/04/2017     Therapeutic Level Labs: No results found for: LITHIUM No results found for: VALPROATE No components found for:  CBMZ  Current Medications: Current Outpatient Medications  Medication Sig Dispense Refill  . Insulin Pen Needle (BD ULTRA-FINE PEN NEEDLES) 29G X 12.7MM MISC Use 3 times daily as directed    . ACCU-CHEK AVIVA PLUS test strip     . ACCU-CHEK SOFTCLIX LANCETS lancets     . acetaZOLAMIDE (DIAMOX) 125 MG tablet Take 125 mg by mouth daily.     Marland Kitchen AIMOVIG 70 MG/ML SOAJ Inject 70 mg into the skin every 28 (twenty-eight) days.    Marland Kitchen albuterol (VENTOLIN HFA) 108 (90 Base) MCG/ACT inhaler Inhale 2 puffs into the lungs every 6 (six) hours as needed for wheezing or shortness of breath. 1 g 0  . ARIPiprazole (ABILIFY) 5 MG tablet Take 0.5 tablets (2.5 mg total) by mouth daily. 90 tablet 1  . aspirin-acetaminophen-caffeine (EXCEDRIN MIGRAINE) 250-250-65 MG tablet Take 2 tablets by mouth every 8 (eight) hours as needed for headache or migraine.     Marland Kitchen atorvastatin (LIPITOR) 80 MG tablet Take 80 mg by mouth daily at 6 PM.    . baclofen (LIORESAL) 10 MG tablet TAKE 1 TABLET BY MOUTH ONCE DAILY AS NEEDED FOR UP TO 30 DAYS. MAY TAKE 2 TABLETS IF NEEDED    . Blood Glucose Monitoring Suppl (ACCU-CHEK AVIVA PLUS) w/Device KIT     . Carboxymethylcellulose Sod PF (REFRESH PLUS) 0.5 % SOLN Apply to eye.    . Cholecalciferol (VITAMIN D3) 1000 units CAPS Take 1 capsule by mouth daily.    . clotrimazole (LOTRIMIN) 1 % cream     . cyclobenzaprine (FLEXERIL) 5 MG tablet Take 5 mg by mouth 3 (three) times daily as needed.     . dabigatran (PRADAXA) 150 MG CAPS capsule Take 150 mg by mouth 2 (two) times daily.    . dabigatran (PRADAXA) 150 MG CAPS capsule Take 150 mg by mouth 2 (two) times daily.    Marland Kitchen  diltiazem (CARDIZEM CD) 240 MG 24 hr capsule Take by mouth. Take 1 capsule by mouth once daily    . DULoxetine (CYMBALTA) 30 MG capsule Take 1 capsule (30 mg total) by mouth daily. To be combined with 60 mg 90  capsule 0  . DULoxetine (CYMBALTA) 60 MG capsule Take 1 capsule (60 mg total) by mouth daily. To be combined with 30 mg 90 capsule 0  . esomeprazole (NEXIUM) 40 MG capsule Take 40 mg by mouth daily.    . famotidine (PEPCID) 40 MG tablet Take 40 mg by mouth 2 (two) times daily.    . ferrous sulfate 325 (65 FE) MG tablet Take by mouth. Take 325 mg daily with breakfast    . fluticasone (FLONASE) 50 MCG/ACT nasal spray Place 2 sprays into both nostrils as needed.     . furosemide (LASIX) 20 MG tablet Take by mouth.    . gabapentin (NEURONTIN) 300 MG capsule Take 1 capsule (300 mg total) by mouth at bedtime. 90 capsule 0  . hydrOXYzine (VISTARIL) 25 MG capsule TAKE (1) CAPSULE BY MOUTH TWICE DAILY ASNEEDED FOR SEVERA ANXIETY ATTACKS 180 capsule 0  . Insulin Degludec (TRESIBA FLEXTOUCH) 200 UNIT/ML SOPN Inject into the skin.    Marland Kitchen lamoTRIgine (LAMICTAL) 100 MG tablet Take 1 tablet (100 mg total) by mouth daily. To be combined with 25 mg 90 tablet 0  . lamoTRIgine (LAMICTAL) 25 MG tablet TAKE ONE TABLET BY MOUTH DAILY. TO BE COMBINED WITH 100 MG. 90 tablet 0  . levothyroxine (SYNTHROID, LEVOTHROID) 100 MCG tablet Take 100 mcg by mouth daily.    Marland Kitchen loratadine (CLARITIN) 10 MG tablet Take 10 mg by mouth daily as needed for allergies.    . Magnesium (V-R MAGNESIUM) 250 MG TABS Take by mouth.    . Magnesium 250 MG TABS Take 1 tablet by mouth daily.     . Melatonin 10 MG TABS Take 2 tablets by mouth at bedtime.    . metFORMIN (GLUCOPHAGE-XR) 500 MG 24 hr tablet Take 500 mg by mouth 2 (two) times daily.     . naloxone (NARCAN) nasal spray 4 mg/0.1 mL 1 spray into nose for opioid overdose. If needed, may repeat spray in 2-3 min    . NUCYNTA ER 100 MG 12 hr tablet Take 1 tablet by mouth 2 (two) times daily.    Marland Kitchen nystatin (MYCOSTATIN) 100000 UNIT/ML suspension Take 5 mLs by mouth 4 (four) times daily.    Marland Kitchen omeprazole (PRILOSEC) 20 MG capsule TAKE (1) CAPSULE BY MOUTH EVERY DAY 30 capsule 0  . ondansetron (ZOFRAN  ODT) 4 MG disintegrating tablet Take 1 tablet (4 mg total) by mouth every 8 (eight) hours as needed for nausea or vomiting. 12 tablet 0  . ondansetron (ZOFRAN) 4 MG tablet TAKE (1) TABLET BY MOUTH EVERY 8 HOURS AS NEEDED FOR NAUSEA AND VOMITING    . OZEMPIC, 0.25 OR 0.5 MG/DOSE, 2 MG/1.5ML SOPN Inject into the skin.    . pioglitazone (ACTOS) 15 MG tablet Take by mouth. Take 1 tablet by mouth once daily    . quinapril (ACCUPRIL) 10 MG tablet Take 10 mg by mouth daily.    . Rimegepant Sulfate (NURTEC) 75 MG TBDP Take 1 tablet by mouth as needed.     . traZODone (DESYREL) 100 MG tablet Take 1.5-2 tablets (150-200 mg total) by mouth at bedtime as needed for sleep. 180 tablet 0  . TRESIBA FLEXTOUCH 200 UNIT/ML SOPN Inject 32 Units into the skin at bedtime.  No current facility-administered medications for this visit.     Musculoskeletal: Strength & Muscle Tone: UTA Gait & Station: UTA Patient leans: N/A  Psychiatric Specialty Exam: Review of Systems  Psychiatric/Behavioral: Positive for sleep disturbance. The patient is nervous/anxious.   All other systems reviewed and are negative.   There were no vitals taken for this visit.There is no height or weight on file to calculate BMI.  General Appearance: UTA  Eye Contact:  UTA  Speech:  Clear and Coherent  Volume:  Normal  Mood:  Anxious  Affect:  UTA  Thought Process:  Goal Directed and Descriptions of Associations: Intact  Orientation:  Full (Time, Place, and Person)  Thought Content: Logical   Suicidal Thoughts:  No  Homicidal Thoughts:  No  Memory:  Immediate;   Fair Recent;   Fair Remote;   Fair  Judgement:  Fair  Insight:  Fair  Psychomotor Activity:  UTA  Concentration:  Concentration: Fair and Attention Span: Fair  Recall:  [employer] of Knowledge: Fair  Language: Fair  Akathisia:  No  Handed:  Right  AIMS (if indicated): UTA  Assets:  Communication Skills Desire for Improvement Housing Social Support  ADL's:   Intact  Cognition: WNL  Sleep:  Poor   Screenings:   Assessment and Plan: CELINA SHILEY is a 54 year old Caucasian female who has a history of MDD, PTSD, migraine headaches, history of CVA, iron deficiency, prothrombin gene mutation was evaluated by phone today.  Patient is currently struggling with sleep problems due to neuropathy pain.  She also has psychosocial stressors of being the primary caretaker of her mother, having a special needs son as well as a friend who struggles with medical problems.  Patient will benefit from the following plan.  Plan MDD in full remission Cymbalta 90 mg p.o. daily Lamictal 125 mg p.o. daily We will reduce Abilify to 2.5 mg p.o. daily.  Long-term plan is to taper her off.  GAD-stable Cymbalta 90 mg p.o. daily Hydroxyzine 25 mg p.o. daily as needed for anxiety attacks.  PTSD-stable CBT as needed  Insomnia-unstable Continue trazodone 150 to 200 mg p.o. nightly Melatonin as prescribed. Start gabapentin 300 mg p.o. nightly. If she responds to gabapentin she can be tapered off of the Lamictal since gabapentin is also a mood stabilizer.  Follow-up in clinic in 3 to 4 weeks or sooner if needed.  I have spent atleast 20 minutes non face to face with patient today. More than 50 % of the time was spent for preparing to see the patient ( e.g., review of test, records ), ordering medications and test ,psychoeducation and supportive psychotherapy and care coordination,as well as documenting clinical information in electronic health record. This note was generated in part or whole with voice recognition software. Voice recognition is usually quite accurate but there are transcription errors that can and very often do occur. I apologize for any typographical errors that were not detected and corrected.      Ursula Alert, MD 03/20/2020, 8:25 AM

## 2020-04-03 ENCOUNTER — Ambulatory Visit: Payer: Medicare Other | Admitting: Pulmonary Disease

## 2020-04-14 ENCOUNTER — Emergency Department: Payer: Medicare Other

## 2020-04-14 ENCOUNTER — Emergency Department
Admission: EM | Admit: 2020-04-14 | Discharge: 2020-04-14 | Disposition: A | Discharge status: Home / Self Care | Payer: Medicare Other | Attending: Emergency Medicine | Admitting: Emergency Medicine

## 2020-04-14 ENCOUNTER — Other Ambulatory Visit: Payer: Self-pay

## 2020-04-14 ENCOUNTER — Encounter: Payer: Self-pay | Admitting: Emergency Medicine

## 2020-04-14 DIAGNOSIS — S20219A Contusion of unspecified front wall of thorax, initial encounter: Secondary | ICD-10-CM

## 2020-04-14 DIAGNOSIS — S301XXA Contusion of abdominal wall, initial encounter: Secondary | ICD-10-CM | POA: Insufficient documentation

## 2020-04-14 DIAGNOSIS — S62356A Nondisplaced fracture of shaft of fifth metacarpal bone, right hand, initial encounter for closed fracture: Secondary | ICD-10-CM

## 2020-04-14 DIAGNOSIS — Z7982 Long term (current) use of aspirin: Secondary | ICD-10-CM | POA: Insufficient documentation

## 2020-04-14 DIAGNOSIS — S0990XA Unspecified injury of head, initial encounter: Secondary | ICD-10-CM | POA: Diagnosis not present

## 2020-04-14 DIAGNOSIS — R079 Chest pain, unspecified: Secondary | ICD-10-CM | POA: Diagnosis not present

## 2020-04-14 DIAGNOSIS — I10 Essential (primary) hypertension: Secondary | ICD-10-CM | POA: Insufficient documentation

## 2020-04-14 DIAGNOSIS — E039 Hypothyroidism, unspecified: Secondary | ICD-10-CM | POA: Diagnosis not present

## 2020-04-14 DIAGNOSIS — Z7984 Long term (current) use of oral hypoglycemic drugs: Secondary | ICD-10-CM | POA: Diagnosis not present

## 2020-04-14 DIAGNOSIS — Z96652 Presence of left artificial knee joint: Secondary | ICD-10-CM | POA: Insufficient documentation

## 2020-04-14 DIAGNOSIS — S6991XA Unspecified injury of right wrist, hand and finger(s), initial encounter: Secondary | ICD-10-CM | POA: Diagnosis present

## 2020-04-14 DIAGNOSIS — Z79899 Other long term (current) drug therapy: Secondary | ICD-10-CM | POA: Diagnosis not present

## 2020-04-14 DIAGNOSIS — E119 Type 2 diabetes mellitus without complications: Secondary | ICD-10-CM | POA: Insufficient documentation

## 2020-04-14 DIAGNOSIS — Z8673 Personal history of transient ischemic attack (TIA), and cerebral infarction without residual deficits: Secondary | ICD-10-CM | POA: Insufficient documentation

## 2020-04-14 DIAGNOSIS — S3991XA Unspecified injury of abdomen, initial encounter: Secondary | ICD-10-CM

## 2020-04-14 DIAGNOSIS — Y92410 Unspecified street and highway as the place of occurrence of the external cause: Secondary | ICD-10-CM | POA: Diagnosis not present

## 2020-04-14 LAB — CBC WITH DIFFERENTIAL/PLATELET
Abs Immature Granulocytes: 0.04 10*3/uL (ref 0.00–0.07)
Basophils Absolute: 0 10*3/uL (ref 0.0–0.1)
Basophils Relative: 0 %
Eosinophils Absolute: 0.1 10*3/uL (ref 0.0–0.5)
Eosinophils Relative: 1 %
HCT: 40.6 % (ref 36.0–46.0)
Hemoglobin: 12.9 g/dL (ref 12.0–15.0)
Immature Granulocytes: 0 %
Lymphocytes Relative: 20 %
Lymphs Abs: 2.1 10*3/uL (ref 0.7–4.0)
MCH: 28.5 pg (ref 26.0–34.0)
MCHC: 31.8 g/dL (ref 30.0–36.0)
MCV: 89.8 fL (ref 80.0–100.0)
Monocytes Absolute: 0.7 10*3/uL (ref 0.1–1.0)
Monocytes Relative: 6 %
Neutro Abs: 7.5 10*3/uL (ref 1.7–7.7)
Neutrophils Relative %: 73 %
Platelets: 215 10*3/uL (ref 150–400)
RBC: 4.52 MIL/uL (ref 3.87–5.11)
RDW: 13 % (ref 11.5–15.5)
WBC: 10.4 10*3/uL (ref 4.0–10.5)
nRBC: 0 % (ref 0.0–0.2)

## 2020-04-14 LAB — BASIC METABOLIC PANEL
Anion gap: 12 (ref 5–15)
BUN: 14 mg/dL (ref 6–20)
CO2: 24 mmol/L (ref 22–32)
Calcium: 9.2 mg/dL (ref 8.9–10.3)
Chloride: 101 mmol/L (ref 98–111)
Creatinine, Ser: 1.1 mg/dL — ABNORMAL HIGH (ref 0.44–1.00)
GFR, Estimated: 59 mL/min — ABNORMAL LOW (ref >60)
Glucose, Bld: 157 mg/dL — ABNORMAL HIGH (ref 70–99)
Potassium: 3.9 mmol/L (ref 3.5–5.1)
Sodium: 137 mmol/L (ref 135–145)

## 2020-04-14 MED ORDER — OXYCODONE-ACETAMINOPHEN 5-325 MG PO TABS
1.0000 | ORAL_TABLET | ORAL | 0 refills | Status: AC | PRN
Start: 2020-04-14 — End: 2020-04-19

## 2020-04-14 MED ORDER — MORPHINE SULFATE (PF) 4 MG/ML IV SOLN
4.0000 mg | Freq: Once | INTRAVENOUS | Status: AC
  Administered 2020-04-14: 4 mg via INTRAVENOUS
  Filled 2020-04-14: qty 1

## 2020-04-14 MED ORDER — IOHEXOL 300 MG/ML  SOLN
125.0000 mL | Freq: Once | INTRAMUSCULAR | Status: AC | PRN
  Administered 2020-04-14: 125 mL via INTRAVENOUS

## 2020-04-14 MED ORDER — OXYCODONE-ACETAMINOPHEN 5-325 MG PO TABS
1.0000 | ORAL_TABLET | Freq: Once | ORAL | Status: AC
  Administered 2020-04-14: 1 via ORAL
  Filled 2020-04-14: qty 1

## 2020-04-14 MED ORDER — ONDANSETRON HCL 4 MG/2ML IJ SOLN
4.0000 mg | Freq: Once | INTRAMUSCULAR | Status: AC
  Administered 2020-04-14: 4 mg via INTRAVENOUS
  Filled 2020-04-14: qty 2

## 2020-04-14 NOTE — ED Notes (Signed)
[facility] sent to lab

## 2020-04-14 NOTE — Discharge Instructions (Addendum)
We have provided contact information for 2 of our area orthopedic specialist.  You should make an appointment to follow-up with one of them in approximately 1 week.  You may take the Percocet as needed over the next few days for pain, however you should transition to over-the-counter Tylenol or ibuprofen as soon as you are able to.  Return to the ER for new, worsening, or persistent severe pain, weakness or numbness, shortness of breath, or any other new or worsening symptoms that concern you.
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
## 2020-04-21 ENCOUNTER — Encounter: Payer: Self-pay | Admitting: Psychiatry

## 2020-04-21 ENCOUNTER — Other Ambulatory Visit: Payer: Self-pay

## 2020-04-21 ENCOUNTER — Telehealth (INDEPENDENT_AMBULATORY_CARE_PROVIDER_SITE_OTHER): Payer: Medicare Other | Admitting: Psychiatry

## 2020-04-21 DIAGNOSIS — F4310 Post-traumatic stress disorder, unspecified: Secondary | ICD-10-CM

## 2020-04-21 DIAGNOSIS — G4701 Insomnia due to medical condition: Secondary | ICD-10-CM

## 2020-04-21 DIAGNOSIS — F411 Generalized anxiety disorder: Secondary | ICD-10-CM

## 2020-04-21 DIAGNOSIS — F3342 Major depressive disorder, recurrent, in full remission: Secondary | ICD-10-CM | POA: Diagnosis not present

## 2020-04-21 MED ORDER — TRAZODONE HCL 100 MG PO TABS: 150.0000 mg | ORAL_TABLET | Freq: Every evening | ORAL | 0 refills | Status: DC | PRN

## 2020-04-21 MED ORDER — GABAPENTIN 300 MG PO CAPS: 300.0000 mg | ORAL_CAPSULE | Freq: Two times a day (BID) | ORAL | 0 refills | Status: DC
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
## 2020-04-30 ENCOUNTER — Encounter: Payer: Self-pay | Admitting: Pulmonary Disease

## 2020-04-30 ENCOUNTER — Other Ambulatory Visit: Payer: Self-pay

## 2020-04-30 ENCOUNTER — Ambulatory Visit (INDEPENDENT_AMBULATORY_CARE_PROVIDER_SITE_OTHER): Payer: Medicare Other | Admitting: Pulmonary Disease

## 2020-04-30 VITALS — BP 120/78 | HR 89 | Temp 97.8°F | Ht 66.0 in | Wt 256.6 lb

## 2020-04-30 DIAGNOSIS — Z86718 Personal history of other venous thrombosis and embolism: Secondary | ICD-10-CM | POA: Diagnosis not present

## 2020-04-30 DIAGNOSIS — D6852 Prothrombin gene mutation: Secondary | ICD-10-CM

## 2020-04-30 DIAGNOSIS — K068 Other specified disorders of gingiva and edentulous alveolar ridge: Secondary | ICD-10-CM

## 2020-04-30 DIAGNOSIS — R042 Hemoptysis: Secondary | ICD-10-CM | POA: Diagnosis not present
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
## 2020-04-30 NOTE — Patient Instructions (Signed)
Follow up here as needed

## 2020-05-05 ENCOUNTER — Other Ambulatory Visit: Payer: Self-pay | Admitting: Psychiatry

## 2020-05-05 DIAGNOSIS — F4310 Post-traumatic stress disorder, unspecified: Secondary | ICD-10-CM

## 2020-05-26 ENCOUNTER — Telehealth (INDEPENDENT_AMBULATORY_CARE_PROVIDER_SITE_OTHER): Payer: Medicare Other | Admitting: Psychiatry

## 2020-05-26 ENCOUNTER — Other Ambulatory Visit: Payer: Self-pay

## 2020-05-26 DIAGNOSIS — Z5329 Procedure and treatment not carried out because of patient's decision for other reasons: Secondary | ICD-10-CM

## 2020-05-26 NOTE — Progress Notes (Signed)
No response to call or text or video invite  

## 2020-06-09 ENCOUNTER — Other Ambulatory Visit: Payer: Self-pay | Admitting: Psychiatry

## 2020-06-09 DIAGNOSIS — F4310 Post-traumatic stress disorder, unspecified: Secondary | ICD-10-CM

## 2020-06-16 ENCOUNTER — Other Ambulatory Visit: Payer: Self-pay

## 2020-06-17 ENCOUNTER — Inpatient Hospital Stay: Payer: Medicare Other

## 2020-06-17 ENCOUNTER — Telehealth: Payer: Self-pay | Admitting: Hematology and Oncology

## 2020-06-17 ENCOUNTER — Other Ambulatory Visit: Payer: Self-pay

## 2020-06-17 ENCOUNTER — Telehealth (INDEPENDENT_AMBULATORY_CARE_PROVIDER_SITE_OTHER): Payer: Medicare Other | Admitting: Psychiatry

## 2020-06-17 ENCOUNTER — Encounter: Payer: Self-pay | Admitting: Psychiatry

## 2020-06-17 DIAGNOSIS — F4310 Post-traumatic stress disorder, unspecified: Secondary | ICD-10-CM | POA: Diagnosis not present

## 2020-06-17 DIAGNOSIS — G4701 Insomnia due to medical condition: Secondary | ICD-10-CM | POA: Diagnosis not present

## 2020-06-17 DIAGNOSIS — F411 Generalized anxiety disorder: Secondary | ICD-10-CM

## 2020-06-17 DIAGNOSIS — F3342 Major depressive disorder, recurrent, in full remission: Secondary | ICD-10-CM

## 2020-06-17 DIAGNOSIS — D509 Iron deficiency anemia, unspecified: Secondary | ICD-10-CM | POA: Insufficient documentation

## 2020-06-17 LAB — CBC WITH DIFFERENTIAL/PLATELET
Abs Immature Granulocytes: 0.03 10*3/uL (ref 0.00–0.07)
Basophils Absolute: 0 10*3/uL (ref 0.0–0.1)
Basophils Relative: 0 %
Eosinophils Absolute: 0.2 10*3/uL (ref 0.0–0.5)
Eosinophils Relative: 2 %
HCT: 36.6 % (ref 36.0–46.0)
Hemoglobin: 11.6 g/dL — ABNORMAL LOW (ref 12.0–15.0)
Immature Granulocytes: 0 %
Lymphocytes Relative: 28 %
Lymphs Abs: 2.2 10*3/uL (ref 0.7–4.0)
MCH: 27.5 pg (ref 26.0–34.0)
MCHC: 31.7 g/dL (ref 30.0–36.0)
MCV: 86.7 fL (ref 80.0–100.0)
Monocytes Absolute: 0.5 10*3/uL (ref 0.1–1.0)
Monocytes Relative: 6 %
Neutro Abs: 5 10*3/uL (ref 1.7–7.7)
Neutrophils Relative %: 64 %
Platelets: 235 10*3/uL (ref 150–400)
RBC: 4.22 MIL/uL (ref 3.87–5.11)
RDW: 14 % (ref 11.5–15.5)
WBC: 7.9 10*3/uL (ref 4.0–10.5)
nRBC: 0 % (ref 0.0–0.2)

## 2020-06-17 LAB — FERRITIN: Ferritin: 10 ng/mL — ABNORMAL LOW (ref 11–307)

## 2020-06-17 MED ORDER — DULOXETINE HCL 60 MG PO CPEP: 60.0000 mg | ORAL_CAPSULE | Freq: Every day | ORAL | 0 refills | Status: DC

## 2020-06-17 MED ORDER — DULOXETINE HCL 30 MG PO CPEP: 30.0000 mg | ORAL_CAPSULE | Freq: Every day | ORAL | 0 refills | Status: DC

## 2020-06-17 MED ORDER — GABAPENTIN 600 MG PO TABS: 600.0000 mg | ORAL_TABLET | Freq: Two times a day (BID) | ORAL | 0 refills | Status: DC
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
## 2020-06-17 NOTE — Telephone Encounter (Signed)
06/17/2020 Left vm informing pt about missed lab appt today, and to call back to r/s  SRW
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
## 2020-06-18 ENCOUNTER — Inpatient Hospital Stay: Payer: Medicare Other

## 2020-06-18 ENCOUNTER — Inpatient Hospital Stay: Payer: Medicare Other | Attending: Hematology and Oncology | Admitting: Hematology and Oncology

## 2020-06-18 ENCOUNTER — Encounter: Payer: Self-pay | Admitting: Hematology and Oncology

## 2020-06-18 VITALS — BP 105/74 | HR 72 | Temp 98.6°F | Resp 18 | Wt 252.6 lb

## 2020-06-18 VITALS — BP 105/73 | HR 73

## 2020-06-18 DIAGNOSIS — D509 Iron deficiency anemia, unspecified: Secondary | ICD-10-CM

## 2020-06-18 MED ORDER — SODIUM CHLORIDE 0.9 % IV SOLN
Freq: Once | INTRAVENOUS | Status: AC
  Filled 2020-06-18: qty 250

## 2020-06-18 MED ORDER — IRON SUCROSE 20 MG/ML IV SOLN
200.0000 mg | Freq: Once | INTRAVENOUS | Status: AC
  Administered 2020-06-18: 200 mg via INTRAVENOUS
  Filled 2020-06-18: qty 10

## 2020-06-18 MED ORDER — SODIUM CHLORIDE 0.9 % IV SOLN: 200.0000 mg | Freq: Once | INTRAVENOUS | Status: DC

## 2020-06-18 NOTE — Progress Notes (Signed)
Pt is more fatigued than usual, she had good BM most of time but she does have internal and external hemorrhoids and she does have bleeding with them at times.

## 2020-06-18 NOTE — Progress Notes (Signed)
Patient received prescribed treatment in clinic. IV Venofer. Tolerated well. Patient stable at discharge. 

## 2020-06-19 ENCOUNTER — Ambulatory Visit: Payer: Medicare Other | Admitting: Licensed Clinical Social Worker

## 2020-06-19 ENCOUNTER — Other Ambulatory Visit: Payer: Self-pay

## 2020-06-25 ENCOUNTER — Inpatient Hospital Stay: Payer: Medicare Other | Attending: Hematology and Oncology

## 2020-06-25 ENCOUNTER — Other Ambulatory Visit: Payer: Self-pay

## 2020-06-25 VITALS — BP 119/83 | HR 82 | Temp 96.1°F | Resp 16

## 2020-06-25 DIAGNOSIS — Z79899 Other long term (current) drug therapy: Secondary | ICD-10-CM | POA: Diagnosis not present

## 2020-06-25 DIAGNOSIS — D509 Iron deficiency anemia, unspecified: Secondary | ICD-10-CM

## 2020-06-25 MED ORDER — IRON SUCROSE 20 MG/ML IV SOLN
200.0000 mg | Freq: Once | INTRAVENOUS | Status: AC
  Administered 2020-06-25: 200 mg via INTRAVENOUS
  Filled 2020-06-25: qty 10

## 2020-06-25 MED ORDER — SODIUM CHLORIDE 0.9 % IV SOLN
Freq: Once | INTRAVENOUS | Status: AC
  Filled 2020-06-25: qty 250

## 2020-06-25 MED ORDER — SODIUM CHLORIDE 0.9 % IV SOLN: 200.0000 mg | Freq: Once | INTRAVENOUS | Status: DC

## 2020-06-25 NOTE — Progress Notes (Signed)
Patient received prescribed treatment in clinic. IV Venofer. Tolerated well. Patient stable at discharge. 

## 2020-07-02 ENCOUNTER — Inpatient Hospital Stay: Payer: Medicare Other

## 2020-07-02 ENCOUNTER — Other Ambulatory Visit: Payer: Self-pay

## 2020-07-02 VITALS — BP 106/74 | HR 84 | Temp 98.8°F | Resp 18

## 2020-07-02 DIAGNOSIS — D509 Iron deficiency anemia, unspecified: Secondary | ICD-10-CM | POA: Diagnosis not present

## 2020-07-02 MED ORDER — IRON SUCROSE 20 MG/ML IV SOLN
200.0000 mg | Freq: Once | INTRAVENOUS | Status: AC
  Administered 2020-07-02: 200 mg via INTRAVENOUS
  Filled 2020-07-02: qty 10

## 2020-07-02 MED ORDER — SODIUM CHLORIDE 0.9 % IV SOLN
Freq: Once | INTRAVENOUS | Status: AC
  Filled 2020-07-02: qty 250

## 2020-07-02 MED ORDER — SODIUM CHLORIDE 0.9 % IV SOLN: 200.0000 mg | Freq: Once | INTRAVENOUS | Status: DC

## 2020-07-02 NOTE — Progress Notes (Signed)
Pt received prescribed treatment in clinic, pt stable at d/c. 

## 2020-07-07 ENCOUNTER — Telehealth (INDEPENDENT_AMBULATORY_CARE_PROVIDER_SITE_OTHER): Payer: Medicare Other | Admitting: Psychiatry

## 2020-07-07 ENCOUNTER — Encounter: Payer: Self-pay | Admitting: Psychiatry

## 2020-07-07 ENCOUNTER — Other Ambulatory Visit: Payer: Self-pay

## 2020-07-07 DIAGNOSIS — G4701 Insomnia due to medical condition: Secondary | ICD-10-CM | POA: Diagnosis not present

## 2020-07-07 DIAGNOSIS — F3342 Major depressive disorder, recurrent, in full remission: Secondary | ICD-10-CM | POA: Diagnosis not present

## 2020-07-07 DIAGNOSIS — F411 Generalized anxiety disorder: Secondary | ICD-10-CM | POA: Diagnosis not present

## 2020-07-07 DIAGNOSIS — F4310 Post-traumatic stress disorder, unspecified: Secondary | ICD-10-CM

## 2020-07-07 MED ORDER — TRAZODONE HCL 100 MG PO TABS: 150.0000 mg | ORAL_TABLET | Freq: Every evening | ORAL | 0 refills | Status: DC | PRN

## 2020-07-07 NOTE — Progress Notes (Signed)
Virtual Visit via Video Note  I connected with Christine Mcneil on 07/07/20 at 10:20 AM EST by a video enabled telemedicine application and verified that I am speaking with the correct person using two identifiers.  Location Provider Location : [facility] Patient Location : Home  Participants: Patient , Provider    I discussed the limitations of evaluation and management by telemedicine and the availability of in person appointments. The patient expressed understanding and agreed to proceed.   I discussed the assessment and treatment plan with the patient. The patient was provided an opportunity to ask questions and all were answered. The patient agreed with the plan and demonstrated an understanding of the instructions.   The patient was advised to call back or seek an in-person evaluation if the symptoms worsen or if the condition fails to improve as anticipated.   [facility] [facility] MD OP Progress Note  07/07/2020 5:03 PM Christine Mcneil  MRN:  [id number]  Chief Complaint:  Chief Complaint    Follow-up     HPI: Christine Mcneil is a 55 year old Caucasian female, divorced, lives in [place], has a history of PTSD, MDD, GAD, insomnia, CVA, OSA, history of prothrombin gene mutation, chronic pain, migraine headaches, iron deficiency anemia was evaluated by telemedicine today.  Patient today reports she is currently feeling anxious about her situational stressors. She reports her family and herself are currently sick with flulike symptoms. She is worried about it. She is scheduled for doctor's visit for herself and her family later on today.  Patient otherwise reports her mood symptoms is improving. Denies any depression.  She reports sleep is overall okay on the current medication regimen. She does have pain that does affect her sleep at times however since she is currently on pain medication that does help.  Patient reports she is compliant with her medications as prescribed. She reports the gabapentin could be  contributing to some weight gain. She is agreeable to watching her diet. Otherwise she is doing well.  Patient denies any suicidality, homicidality or perceptual disturbances.  Patient reports she missed her last appointment with therapist however agrees to get in touch to schedule another appointment.  Patient denies any other concerns today.  Visit Diagnosis:    ICD-10-CM   1. MDD (major depressive disorder), recurrent, in full remission ([place])  F33.42   2. PTSD (post-traumatic stress disorder)  F43.10 traZODone (DESYREL) 100 MG tablet  3. GAD (generalized anxiety disorder)  F41.1   4. Insomnia due to medical condition  G47.01    neuropathy pain, back pain    Past Psychiatric History: I have reviewed past psychiatric history from my progress note on 08/16/2017.  Past trials of venlafaxine, Klonopin.  Past Medical History:  Past Medical History:  Diagnosis Date  . Abdominal pain   . Anxiety and depression   . Atypical facial pain   . Bilateral occipital neuralgia   . Cervical spondylosis without myelopathy   . Chronic daily headache   . Chronic pain   . Chronic pelvic pain in female   . Chronic thoracic back pain   . Depression   . Diabetes mellitus without complication ([place])   . Diabetes mellitus, type II ([place])   . Dysphagia   . Dysrhythmia   . Fibromyalgia   . Hyperlipidemia   . Hypertension   . Insomnia   . Iron deficiency anemia   . Left hip pain   . Low back pain   . Migraines   . Numbness   .  Optic neuropathy   . OSA (obstructive sleep apnea)   . Polyarthralgia   . Primary osteoarthritis of both hips   . Prothrombin gene mutation ([place])   . Pseudotumor cerebri   . Rectal bleeding   . Right shoulder pain   . Rotator cuff syndrome   . Stroke ([place])   . Thyroid disease   . TIA (transient ischemic attack) 05/27/2017    Past Surgical History:  Procedure Laterality Date  . ABDOMINAL HYSTERECTOMY     total  . CHOLECYSTECTOMY    . COLONOSCOPY WITH PROPOFOL  N/A 03/21/2018   Procedure: COLONOSCOPY WITH PROPOFOL;  Surgeon: Lucilla Lame, MD;  Location: [facility] ENDOSCOPY;  Service: Endoscopy;  Laterality: N/A;  . ESOPHAGOGASTRODUODENOSCOPY (EGD) WITH PROPOFOL N/A 03/21/2018   Procedure: ESOPHAGOGASTRODUODENOSCOPY (EGD) WITH PROPOFOL;  Surgeon: Lucilla Lame, MD;  Location: [facility] ENDOSCOPY;  Service: Endoscopy;  Laterality: N/A;  . GIVENS CAPSULE STUDY N/A 09/24/2019   Procedure: GIVENS CAPSULE STUDY;  Surgeon: Virgel Manifold, MD;  Location: [facility] ENDOSCOPY;  Service: Endoscopy;  Laterality: N/A;  . JOINT REPLACEMENT    . KNEE SURGERY Left   . ROTATOR CUFF REPAIR Right   . spinal neurostimulator      Family Psychiatric History: I have reviewed family psychiatric history from my progress note on 08/16/2017.  Family History:  Family History  Problem Relation Age of Onset  . Diabetes Mother   . Hyperlipidemia Mother   . Hypertension Mother   . COPD Mother   . CVA Mother   . Anemia Mother   . Diabetes Father   . Hyperlipidemia Father   . Hypertension Father   . Thyroid disease Father   . Alcohol abuse Father   . Peripheral vascular disease Sister   . Hypertension Sister   . Anxiety disorder Son   . Depression Son   . Bipolar disorder Son   . Seizures Son     Social History: I have reviewed social history from my progress note on 08/16/2017. Social History   Socioeconomic History  . Marital status: Divorced    Spouse name: Not on file  . Number of children: 1  . Years of education: Not on file  . Highest education level: High school graduate  Occupational History    Comment: disablitiy  Tobacco Use  . Smoking status: Never Smoker  . Smokeless tobacco: Never Used  Vaping Use  . Vaping Use: Never used  Substance and Sexual Activity  . Alcohol use: No  . Drug use: No  . Sexual activity: Not Currently  Other Topics Concern  . Not on file  Social History Narrative  . Not on file   Social Determinants of Health   Financial  Resource Strain: Not on file  Food Insecurity: Not on file  Transportation Needs: Not on file  Physical Activity: Not on file  Stress: Not on file  Social Connections: Not on file    Allergies:  Allergies  Allergen Reactions  . Amoxapine Itching  . Amoxicillin Itching  . Dapagliflozin Itching    Other reaction(s): Other (See Comments) Thrush & vaginal itching  . Keflex [Cephalexin] Itching  . Mirtazapine Other (See Comments)    Pt states felling "like my body is outside of me."    Metabolic Disorder Labs: Lab Results  Component Value Date   HGBA1C 9.9 (H) 06/21/2017   MPG 237.43 06/21/2017   Lab Results  Component Value Date   PROLACTIN 5.5 09/06/2017   Lab Results  Component Value Date  CHOL 277 (H) 06/21/2017   TRIG 196 (H) 06/21/2017   HDL 64 06/21/2017   CHOLHDL 4.3 06/21/2017   VLDL 39 06/21/2017   LDLCALC 174 (H) 06/21/2017   Lab Results  Component Value Date   TSH 2.890 09/06/2017   TSH 3.176 01/04/2017    Therapeutic Level Labs: No results found for: LITHIUM No results found for: VALPROATE No components found for:  CBMZ  Current Medications: Current Outpatient Medications  Medication Sig Dispense Refill  . ACCU-CHEK AVIVA PLUS test strip     . ACCU-CHEK SOFTCLIX LANCETS lancets     . acetaZOLAMIDE (DIAMOX) 250 MG tablet     . AIMOVIG 70 MG/ML SOAJ Inject 70 mg into the skin every 28 (twenty-eight) days.    Marland Kitchen albuterol (VENTOLIN HFA) 108 (90 Base) MCG/ACT inhaler Inhale 2 puffs into the lungs every 6 (six) hours as needed for wheezing or shortness of breath. 1 g 0  . ARIPiprazole (ABILIFY) 5 MG tablet TAKE (1) TABLET BY MOUTH EVERY DAY 90 tablet 1  . aspirin-acetaminophen-caffeine (EXCEDRIN MIGRAINE) 250-250-65 MG tablet Take 2 tablets by mouth every 8 (eight) hours as needed for headache or migraine.     Marland Kitchen atorvastatin (LIPITOR) 80 MG tablet Take 80 mg by mouth daily at 6 PM.    . Blood Glucose Monitoring Suppl (ACCU-CHEK AVIVA PLUS) w/Device KIT      . Carboxymethylcellulose Sod PF 0.5 % SOLN Apply 1 drop to eye daily as needed.    . Cholecalciferol (VITAMIN D3) 1000 units CAPS Take 1 capsule by mouth daily.    . clotrimazole (LOTRIMIN) 1 % cream Apply 1 application topically daily as needed.    . cyclobenzaprine (FLEXERIL) 5 MG tablet Take 5 mg by mouth 3 (three) times daily as needed.     . dabigatran (PRADAXA) 150 MG CAPS capsule Take 150 mg by mouth 2 (two) times daily.    Marland Kitchen diltiazem (CARDIZEM CD) 240 MG 24 hr capsule Take by mouth. Take 1 capsule by mouth once daily    . DULoxetine (CYMBALTA) 30 MG capsule Take 1 capsule (30 mg total) by mouth daily. To be combined with 60 mg 90 capsule 0  . DULoxetine (CYMBALTA) 60 MG capsule Take 1 capsule (60 mg total) by mouth daily. To be combined with 30 mg 90 capsule 0  . esomeprazole (NEXIUM) 40 MG capsule Take 40 mg by mouth daily.    . famotidine (PEPCID) 40 MG tablet Take 40 mg by mouth 2 (two) times daily.    . ferrous sulfate 325 (65 FE) MG tablet Take by mouth. Take 325 mg daily with breakfast    . fluticasone (FLONASE) 50 MCG/ACT nasal spray Place 2 sprays into both nostrils as needed.     . furosemide (LASIX) 20 MG tablet Take 20 mg by mouth daily.    Marland Kitchen gabapentin (NEURONTIN) 600 MG tablet Take 1 tablet (600 mg total) by mouth 2 (two) times daily. 180 tablet 0  . hydrOXYzine (VISTARIL) 25 MG capsule TAKE (1) CAPSULE BY MOUTH TWICE DAILY ASNEEDED FOR SEVERA ANXIETY ATTACKS 180 capsule 0  . Insulin Degludec (TRESIBA FLEXTOUCH) 200 UNIT/ML SOPN Inject 200 Units into the skin daily.    . insulin lispro (HUMALOG) 100 UNIT/ML KwikPen Inject up to 50 units daily in divided doses, as directed    . Insulin Pen Needle (BD ULTRA-FINE PEN NEEDLES) 29G X 12.7MM MISC Use 3 times daily as directed    . levothyroxine (SYNTHROID, LEVOTHROID) 100 MCG tablet Take 100 mcg  by mouth daily.    Marland Kitchen loratadine (CLARITIN) 10 MG tablet Take 10 mg by mouth daily as needed for allergies.    Marland Kitchen losartan (COZAAR) 25  MG tablet Take 12.5 mg by mouth daily.    . Magnesium 250 MG TABS Take 1 tablet by mouth daily.    . Melatonin 10 MG TABS Take 2 tablets by mouth at bedtime.    . meloxicam (MOBIC) 15 MG tablet Take 15 mg by mouth daily.    . metFORMIN (GLUCOPHAGE-XR) 500 MG 24 hr tablet Take 500 mg by mouth 2 (two) times daily.     . metoprolol succinate (TOPROL-XL) 25 MG 24 hr tablet Take 1 tablet by mouth daily.    . naloxone (NARCAN) nasal spray 4 mg/0.1 mL 1 spray into nose for opioid overdose. If needed, may repeat spray in 2-3 min    . NUCYNTA ER 100 MG 12 hr tablet Take 1 tablet by mouth 2 (two) times daily.    Marland Kitchen nystatin (MYCOSTATIN) 100000 UNIT/ML suspension Take 5 mLs by mouth 4 (four) times daily.    Marland Kitchen omeprazole (PRILOSEC) 20 MG capsule TAKE (1) CAPSULE BY MOUTH EVERY DAY 30 capsule 0  . ondansetron (ZOFRAN ODT) 4 MG disintegrating tablet Take 1 tablet (4 mg total) by mouth every 8 (eight) hours as needed for nausea or vomiting. 12 tablet 0  . oxyCODONE (OXY IR/ROXICODONE) 5 MG immediate release tablet Take 5 mg by mouth 2 (two) times daily as needed.    . pioglitazone (ACTOS) 15 MG tablet Take by mouth. Take 1 tablet by mouth once daily    . promethazine-dextromethorphan (PROMETHAZINE-DM) 6.25-15 MG/5ML syrup Take 5 mLs by mouth 4 (four) times daily as needed.    . quinapril (ACCUPRIL) 10 MG tablet Take 10 mg by mouth daily.    . Riboflavin 400 MG TABS Take 1 tablet by mouth every night  for headaches    . Rimegepant Sulfate (NURTEC) 75 MG TBDP Take 1 tablet by mouth as needed.     . traZODone (DESYREL) 100 MG tablet Take 1.5-2 tablets (150-200 mg total) by mouth at bedtime as needed for sleep. 180 tablet 0  . TRESIBA FLEXTOUCH 200 UNIT/ML SOPN Inject 76 Units into the skin at bedtime.     No current facility-administered medications for this visit.     Musculoskeletal: Strength & Muscle Tone: UTA Gait & Station: UTA Patient leans: N/A  Psychiatric Specialty Exam: Review of Systems  HENT:  Positive for sinus pressure, sneezing and sore throat.   Respiratory: Positive for cough.   Psychiatric/Behavioral: Positive for sleep disturbance (improving). The patient is nervous/anxious.   All other systems reviewed and are negative.   There were no vitals taken for this visit.There is no height or weight on file to calculate BMI.  General Appearance: Casual  Eye Contact:  Fair  Speech:  Clear and Coherent  Volume:  Normal  Mood:  Anxious  Affect:  Congruent  Thought Process:  Goal Directed and Descriptions of Associations: Intact  Orientation:  Full (Time, Place, and Person)  Thought Content: Logical   Suicidal Thoughts:  No  Homicidal Thoughts:  No  Memory:  Immediate;   Fair Recent;   Fair Remote;   Fair  Judgement:  Fair  Insight:  Fair  Psychomotor Activity:  Normal  Concentration:  Concentration: Fair and Attention Span: Fair  Recall:  [employer] of Knowledge: Fair  Language: Fair  Akathisia:  No  Handed:  Right  AIMS (if  indicated): UTA  Assets:  Communication Skills Desire for Improvement Housing Social Support  ADL's:  Intact  Cognition: WNL  Sleep:  Improving   Screenings: PHQ2-9   Flowsheet Row Video Visit from 07/07/2020 in [place]  PHQ-2 Total Score 1    Flowsheet Row Video Visit from 07/07/2020 in [place] No Risk       Assessment and Plan: Christine Mcneil is a 55 year old Caucasian female who has a history of MDD, PTSD, migraine headaches, history of CVA, iron deficiency, prothrombin gene mutation was evaluated by telemedicine today.  Patient is currently struggling with anxiety due to situational stressors of family and herself being sick possibly with flulike symptoms.  Patient is compliant on medications.  Discussed plan as noted below.  Plan MDD in full remission Continue Cymbalta 90 mg p.o. daily Abilify at reduced dosage of 2.5 mg p.o.  daily  GAD-improving Gabapentin 600 mg p.o. twice daily Cymbalta 90 mg p.o. daily Hydroxyzine 25 mg p.o. daily as needed for anxiety attacks Patient advised to reconnect with therapist to start psychotherapy sessions.  PTSD-stable Monitor closely  Insomnia-improving Trazodone 150-200 mg p.o. nightly Melatonin as prescribed Gabapentin will help with pain at night.  Patient to follow-up with her primary care provider for her flulike symptoms.  Follow-up in clinic in 4 weeks or sooner if needed.  I have spent atleast 20 minutes face to face by video with patient today. More than 50 % of the time was spent for preparing to see the patient ( e.g., review of test, records ), ordering medications and test ,psychoeducation and supportive psychotherapy and care coordination,as well as documenting clinical information in electronic health record. This note was generated in part or whole with voice recognition software. Voice recognition is usually quite accurate but there are transcription errors that can and very often do occur. I apologize for any typographical errors that were not detected and corrected.      Ursula Alert, MD 07/07/2020, 5:03 PM

## 2020-07-08 ENCOUNTER — Other Ambulatory Visit: Payer: Self-pay | Admitting: Psychiatry

## 2020-07-08 DIAGNOSIS — F411 Generalized anxiety disorder: Secondary | ICD-10-CM

## 2020-07-09 ENCOUNTER — Other Ambulatory Visit: Payer: Self-pay

## 2020-07-09 ENCOUNTER — Inpatient Hospital Stay: Payer: Medicare Other

## 2020-07-09 VITALS — BP 107/79 | HR 88 | Temp 97.0°F | Resp 16

## 2020-07-09 DIAGNOSIS — D509 Iron deficiency anemia, unspecified: Secondary | ICD-10-CM | POA: Diagnosis not present

## 2020-07-09 MED ORDER — SODIUM CHLORIDE 0.9 % IV SOLN: 200.0000 mg | Freq: Once | INTRAVENOUS | Status: DC

## 2020-07-09 MED ORDER — IRON SUCROSE 20 MG/ML IV SOLN
200.0000 mg | Freq: Once | INTRAVENOUS | Status: AC
  Administered 2020-07-09: 200 mg via INTRAVENOUS
  Filled 2020-07-09: qty 10

## 2020-07-09 MED ORDER — SODIUM CHLORIDE 0.9 % IV SOLN
Freq: Once | INTRAVENOUS | Status: AC
  Filled 2020-07-09: qty 250

## 2020-07-09 NOTE — Progress Notes (Signed)
Patient received prescribed treatment in clinic. IV Venofer. Tolerated well. Patient stable at discharge. 

## 2020-07-21 ENCOUNTER — Encounter: Payer: Self-pay | Admitting: Gastroenterology

## 2020-07-21 ENCOUNTER — Other Ambulatory Visit
Admission: RE | Admit: 2020-07-21 | Discharge: 2020-07-21 | Disposition: A | Discharge status: Home / Self Care | Payer: Medicare Other | Attending: Gastroenterology | Admitting: Gastroenterology

## 2020-07-21 ENCOUNTER — Ambulatory Visit (INDEPENDENT_AMBULATORY_CARE_PROVIDER_SITE_OTHER): Payer: Medicare Other | Admitting: Gastroenterology

## 2020-07-21 VITALS — BP 117/78 | HR 92 | Temp 97.7°F | Ht 66.0 in | Wt 254.0 lb

## 2020-07-21 DIAGNOSIS — R1011 Right upper quadrant pain: Secondary | ICD-10-CM | POA: Diagnosis present

## 2020-07-21 DIAGNOSIS — R748 Abnormal levels of other serum enzymes: Secondary | ICD-10-CM | POA: Diagnosis present

## 2020-07-21 LAB — HEPATIC FUNCTION PANEL
ALT: 37 U/L (ref 0–44)
AST: 24 U/L (ref 15–41)
Albumin: 3.6 g/dL (ref 3.5–5.0)
Alkaline Phosphatase: 127 U/L — ABNORMAL HIGH (ref 38–126)
Bilirubin, Direct: <0.1 mg/dL (ref 0.0–0.2)
Total Bilirubin: 0.4 mg/dL (ref 0.3–1.2)
Total Protein: 7.1 g/dL (ref 6.5–8.1)

## 2020-07-22 NOTE — Progress Notes (Signed)
Christine Antigua, MD [address]  [place]  [place], [place] [address]  Main: [phone]  Fax: [phone]   Primary Care Physician: Doughton, Latricia Heft, MD   Chief Complaint  Patient presents with  . Follow-up    ANEMIA    HPI: Christine Mcneil is a 55 y.o. female previously seen for iron deficiency anemia and elevated liver enzymes here for follow-up.  Patient is reporting new right upper quadrant abdominal pain over the last 2 to 3 months.  No nausea or vomiting.  Dull, 5/ 10, nonradiating.  Recent hemoglobin did show decreased to 11.6, with low ferritin  Previous history:  The pill camera study was inconclusive as capsule did not enter the small bowel.  Stomach was full of food.    Previous work-up for this included EGD and colonoscopy in 2019 which was unrevealing. She has never had a pill camera study.  Denies any diarrhea, blood in stool, melena, hematochezia, abdominal pain, nausea or vomiting.  Latest CBC was normal, with no anemia.  Patient following up with hematology  Patient was recently in the ER for abdominal pain and incidentally found to have elevated liver enzymes and was started on a new medication for diabetes, Ozempic at that time.  She has discontinued this since then.  No further abdominal pain at this time.  Current Outpatient Medications  Medication Sig Dispense Refill  . ACCU-CHEK AVIVA PLUS test strip     . ACCU-CHEK SOFTCLIX LANCETS lancets     . acetaZOLAMIDE (DIAMOX) 250 MG tablet     . AIMOVIG 70 MG/ML SOAJ Inject 70 mg into the skin every 28 (twenty-eight) days.    Marland Kitchen albuterol (VENTOLIN HFA) 108 (90 Base) MCG/ACT inhaler Inhale 2 puffs into the lungs every 6 (six) hours as needed for wheezing or shortness of breath. 1 g 0  . ARIPiprazole (ABILIFY) 5 MG tablet TAKE (1) TABLET BY MOUTH EVERY DAY 90 tablet 1  . aspirin-acetaminophen-caffeine (EXCEDRIN MIGRAINE) 250-250-65 MG tablet Take 2 tablets by mouth every 8 (eight) hours as needed for  headache or migraine.     Marland Kitchen atorvastatin (LIPITOR) 80 MG tablet Take 80 mg by mouth daily at 6 PM.    . Blood Glucose Monitoring Suppl (ACCU-CHEK AVIVA PLUS) w/Device KIT     . Carboxymethylcellulose Sod PF 0.5 % SOLN Apply 1 drop to eye daily as needed.    . Cholecalciferol (VITAMIN D3) 1000 units CAPS Take 1 capsule by mouth daily.    . clotrimazole (LOTRIMIN) 1 % cream Apply 1 application topically daily as needed.    . cyclobenzaprine (FLEXERIL) 5 MG tablet Take 5 mg by mouth 3 (three) times daily as needed.     . DULoxetine (CYMBALTA) 30 MG capsule Take 1 capsule (30 mg total) by mouth daily. To be combined with 60 mg 90 capsule 0  . DULoxetine (CYMBALTA) 60 MG capsule Take 1 capsule (60 mg total) by mouth daily. To be combined with 30 mg 90 capsule 0  . esomeprazole (NEXIUM) 40 MG capsule Take 40 mg by mouth daily.    . famotidine (PEPCID) 40 MG tablet Take 40 mg by mouth 2 (two) times daily.    . ferrous sulfate 325 (65 FE) MG tablet Take by mouth. Take 325 mg daily with breakfast    . fluticasone (FLONASE) 50 MCG/ACT nasal spray Place 2 sprays into both nostrils as needed.     . gabapentin (NEURONTIN) 600 MG tablet Take 1 tablet (600 mg total)  by mouth 2 (two) times daily. 180 tablet 0  . hydrOXYzine (VISTARIL) 25 MG capsule TAKE (1) CAPSULE BY MOUTH TWICE DAILY ASNEEDED FOR SEVERE ANXIETY ATTACKS. 180 capsule 0  . Insulin Degludec (TRESIBA FLEXTOUCH) 200 UNIT/ML SOPN Inject 200 Units into the skin daily.    . insulin lispro (HUMALOG) 100 UNIT/ML KwikPen Inject up to 50 units daily in divided doses, as directed    . Insulin Pen Needle (BD ULTRA-FINE PEN NEEDLES) 29G X 12.7MM MISC Use 3 times daily as directed    . lamoTRIgine (LAMICTAL) 150 MG tablet Take by mouth.    . levothyroxine (SYNTHROID, LEVOTHROID) 100 MCG tablet Take 100 mcg by mouth daily.    Marland Kitchen losartan (COZAAR) 25 MG tablet Take 12.5 mg by mouth daily.    . Magnesium 250 MG TABS Take 1 tablet by mouth daily.    . Melatonin  10 MG TABS Take 2 tablets by mouth at bedtime.    . meloxicam (MOBIC) 15 MG tablet Take 15 mg by mouth daily.    . metFORMIN (GLUCOPHAGE-XR) 500 MG 24 hr tablet Take 500 mg by mouth 2 (two) times daily.     . metoprolol succinate (TOPROL-XL) 25 MG 24 hr tablet Take 1 tablet by mouth daily.    . mupirocin ointment (BACTROBAN) 2 % SMARTSIG:1 Application Topical 2-3 Times Daily    . naloxone (NARCAN) nasal spray 4 mg/0.1 mL 1 spray into nose for opioid overdose. If needed, may repeat spray in 2-3 min    . NUCYNTA ER 100 MG 12 hr tablet Take 1 tablet by mouth 2 (two) times daily.    Marland Kitchen nystatin (MYCOSTATIN) 100000 UNIT/ML suspension Take 5 mLs by mouth 4 (four) times daily.    Marland Kitchen omeprazole (PRILOSEC) 20 MG capsule TAKE (1) CAPSULE BY MOUTH EVERY DAY 30 capsule 0  . ondansetron (ZOFRAN) 4 MG tablet Take 4 mg by mouth at bedtime.    Marland Kitchen oxyCODONE (OXY IR/ROXICODONE) 5 MG immediate release tablet Take 5 mg by mouth 2 (two) times daily as needed.    . promethazine-dextromethorphan (PROMETHAZINE-DM) 6.25-15 MG/5ML syrup Take 5 mLs by mouth 4 (four) times daily as needed.    . quinapril (ACCUPRIL) 10 MG tablet Take 10 mg by mouth daily.    . Riboflavin 400 MG TABS Take 1 tablet by mouth every night  for headaches    . Rimegepant Sulfate (NURTEC) 75 MG TBDP Take 1 tablet by mouth as needed.     . traZODone (DESYREL) 100 MG tablet Take 1.5-2 tablets (150-200 mg total) by mouth at bedtime as needed for sleep. 180 tablet 0  . TRESIBA FLEXTOUCH 200 UNIT/ML SOPN Inject 76 Units into the skin at bedtime.    . dabigatran (PRADAXA) 150 MG CAPS capsule Take 150 mg by mouth 2 (two) times daily.    Marland Kitchen diltiazem (CARDIZEM CD) 240 MG 24 hr capsule Take by mouth. Take 1 capsule by mouth once daily    . furosemide (LASIX) 20 MG tablet Take 20 mg by mouth daily.    . pioglitazone (ACTOS) 15 MG tablet Take by mouth. Take 1 tablet by mouth once daily     No current facility-administered medications for this visit.     Allergies as of 07/21/2020 - Review Complete 07/21/2020  Allergen Reaction Noted  . Amoxapine Itching 09/16/2015  . Amoxicillin Itching   . Dapagliflozin Itching 11/07/2017  . Keflex [cephalexin] Itching 09/16/2015  . Mirtazapine Other (See Comments) 06/08/2019    ROS:  General: Negative  for anorexia, weight loss, fever, chills, fatigue, weakness. ENT: Negative for hoarseness, difficulty swallowing , nasal congestion. CV: Negative for chest pain, angina, palpitations, dyspnea on exertion, peripheral edema.  Respiratory: Negative for dyspnea at rest, dyspnea on exertion, cough, sputum, wheezing.  GI: See history of present illness. GU:  Negative for dysuria, hematuria, urinary incontinence, urinary frequency, nocturnal urination.  Endo: Negative for unusual weight change.    Physical Examination:   BP 117/78   Pulse 92   Temp 97.7 F (36.5 C) (Temporal)   Ht 5' 6"  (1.676 m)   Wt 254 lb (115.2 kg)   BMI 41.00 kg/m   General: Well-nourished, well-developed in no acute distress.  Eyes: No icterus. Conjunctivae pink. Mouth: Oropharyngeal mucosa moist and pink , no lesions erythema or exudate. Neck: Supple, Trachea midline Abdomen: Bowel sounds are normal, nontender, nondistended, no hepatosplenomegaly or masses, no abdominal bruits or hernia , no rebound or guarding.   Extremities: No lower extremity edema. No clubbing or deformities. Neuro: Alert and oriented x 3.  Grossly intact. Skin: Warm and dry, no jaundice.   Psych: Alert and cooperative, normal mood and affect.   Labs: CMP     Component Value Date/Time   NA 137 04/14/2020 1717   NA 138 03/22/2014 1659   K 3.9 04/14/2020 1717   K 3.7 03/22/2014 1659   CL 101 04/14/2020 1717   CL 104 03/22/2014 1659   CO2 24 04/14/2020 1717   CO2 23 03/22/2014 1659   GLUCOSE 157 (H) 04/14/2020 1717   GLUCOSE 156 (H) 03/22/2014 1659   BUN 14 04/14/2020 1717   BUN 7 03/22/2014 1659   CREATININE 1.10 (H) 04/14/2020 1717    CREATININE 0.98 03/22/2014 1659   CALCIUM 9.2 04/14/2020 1717   CALCIUM 9.0 03/22/2014 1659   PROT 7.1 07/21/2020 1040   PROT 7.4 10/31/2019 1545   PROT 8.8 (H) 03/22/2014 1659   ALBUMIN 3.6 07/21/2020 1040   ALBUMIN 4.4 10/31/2019 1545   ALBUMIN 4.5 03/22/2014 1659   AST 24 07/21/2020 1040   AST 34 03/22/2014 1659   ALT 37 07/21/2020 1040   ALT 37 03/22/2014 1659   ALKPHOS 127 (H) 07/21/2020 1040   ALKPHOS 147 (H) 03/22/2014 1659   BILITOT 0.4 07/21/2020 1040   BILITOT 0.3 10/31/2019 1545   BILITOT 0.3 03/22/2014 1659   GFRNONAA 59 (L) 04/14/2020 1717   GFRNONAA >60 03/22/2014 1659   GFRNONAA >60 09/15/2013 0131   GFRAA >60 09/25/2019 1015   GFRAA >60 03/22/2014 1659   GFRAA >60 09/15/2013 0131   Lab Results  Component Value Date   WBC 7.9 06/17/2020   HGB 11.6 (L) 06/17/2020   HCT 36.6 06/17/2020   MCV 86.7 06/17/2020   PLT 235 06/17/2020    Imaging Studies: No results found.  Assessment and Plan:   Christine Mcneil is a 55 y.o. y/o female with a history of iron deficiency anemia here for follow-up and reporting new right upper quadrant abdominal pain  Obtain right upper quadrant ultrasound Labs today show normal transaminases but mildly elevated alk phos  Check GGT with next labs, alk phos has been elevated before and then normalized, may not be liver etiology  After ultrasound results are available, consider EGD and colonoscopy given recurrent anemia and abdominal pain  Dr Christine Mcneil

## 2020-07-31 ENCOUNTER — Other Ambulatory Visit: Payer: Self-pay

## 2020-07-31 ENCOUNTER — Ambulatory Visit
Admission: RE | Admit: 2020-07-31 | Discharge: 2020-07-31 | Disposition: A | Discharge status: Home / Self Care | Payer: Medicare Other | Source: Ambulatory Visit | Attending: Gastroenterology | Admitting: Gastroenterology

## 2020-07-31 DIAGNOSIS — R748 Abnormal levels of other serum enzymes: Secondary | ICD-10-CM | POA: Insufficient documentation

## 2020-07-31 DIAGNOSIS — R1011 Right upper quadrant pain: Secondary | ICD-10-CM | POA: Diagnosis present

## 2020-08-04 ENCOUNTER — Other Ambulatory Visit: Payer: Self-pay

## 2020-08-04 ENCOUNTER — Encounter: Payer: Self-pay | Admitting: Psychiatry

## 2020-08-04 ENCOUNTER — Telehealth (INDEPENDENT_AMBULATORY_CARE_PROVIDER_SITE_OTHER): Payer: Medicare Other | Admitting: Psychiatry

## 2020-08-04 DIAGNOSIS — G4701 Insomnia due to medical condition: Secondary | ICD-10-CM | POA: Diagnosis not present

## 2020-08-04 DIAGNOSIS — F411 Generalized anxiety disorder: Secondary | ICD-10-CM | POA: Diagnosis not present

## 2020-08-04 DIAGNOSIS — F4310 Post-traumatic stress disorder, unspecified: Secondary | ICD-10-CM

## 2020-08-04 DIAGNOSIS — F3342 Major depressive disorder, recurrent, in full remission: Secondary | ICD-10-CM

## 2020-08-04 MED ORDER — ARIPIPRAZOLE 5 MG PO TABS: 2.5000 mg | ORAL_TABLET | Freq: Every day | ORAL | 1 refills | Status: DC

## 2020-08-04 NOTE — Progress Notes (Unsigned)
Virtual Visit via Video Note  I connected with Christine Mcneil on 08/04/20 at  2:20 PM EDT by a video enabled telemedicine application and verified that I am speaking with the correct person using two identifiers.  Location Provider Location : [facility] Patient Location : Home  Participants: Patient , Provider   I discussed the limitations of evaluation and management by telemedicine and the availability of in person appointments. The patient expressed understanding and agreed to proceed.   I discussed the assessment and treatment plan with the patient. The patient was provided an opportunity to ask questions and all were answered. The patient agreed with the plan and demonstrated an understanding of the instructions.   The patient was advised to call back or seek an in-person evaluation if the symptoms worsen or if the condition fails to improve as anticipated.   [place] MD OP Progress Note  08/04/2020 2:38 PM Christine Mcneil  MRN:  [id number]  Chief Complaint:  Chief Complaint    Follow-up; Anxiety; Insomnia     HPI: Christine Mcneil is a 55 year old Caucasian female, divorced, lives in [place], has a history of PTSD, MDD, GAD, insomnia, CVA, OSA, history of prothrombin gene mutation, chronic pain, migraine headache, iron deficiency anemia was evaluated by telemedicine today.  Patient today reports she recovered from her bronchiolitis.  She reports her son was infected with COVID-19.  Since she was vaccinated she did not get it.  Patient reports she is tolerating the medications well.  Denies any significant mood symptoms.  She takes trazodone 200 mg at bedtime for sleep and that helps.  She denies any suicidality, homicidality or perceptual disturbances.  She continues to struggle with chronic pain.  She reports she has upcoming appointment with [facility] tomorrow for neuro stimulation.  She is hopeful that will help her.  Patient denies any other concerns today.  Visit Diagnosis:     ICD-10-CM   1. MDD (major depressive disorder), recurrent, in full remission ([place])  F33.42   2. PTSD (post-traumatic stress disorder)  F43.10 ARIPiprazole (ABILIFY) 5 MG tablet  3. GAD (generalized anxiety disorder)  F41.1   4. Insomnia due to medical condition  G47.01    neuropathy,back pain    Past Psychiatric History: I have reviewed past psychiatric history from my progress note on 08/16/2017.  Past trials of venlafaxine, Klonopin.  Past Medical History:  Past Medical History:  Diagnosis Date  . Abdominal pain   . Anxiety and depression   . Atypical facial pain   . Bilateral occipital neuralgia   . Cervical spondylosis without myelopathy   . Chronic daily headache   . Chronic pain   . Chronic pelvic pain in female   . Chronic thoracic back pain   . Depression   . Diabetes mellitus without complication ([place])   . Diabetes mellitus, type II ([place])   . Dysphagia   . Dysrhythmia   . Fibromyalgia   . Hyperlipidemia   . Hypertension   . Insomnia   . Iron deficiency anemia   . Left hip pain   . Low back pain   . Migraines   . Numbness   . Optic neuropathy   . OSA (obstructive sleep apnea)   . Polyarthralgia   . Primary osteoarthritis of both hips   . Prothrombin gene mutation ([place])   . Pseudotumor cerebri   . Rectal bleeding   . Right shoulder pain   . Rotator cuff syndrome   . Stroke ([place])   .  Thyroid disease   . TIA (transient ischemic attack) 05/27/2017    Past Surgical History:  Procedure Laterality Date  . ABDOMINAL HYSTERECTOMY     total  . CHOLECYSTECTOMY    . COLONOSCOPY WITH PROPOFOL N/A 03/21/2018   Procedure: COLONOSCOPY WITH PROPOFOL;  Surgeon: Lucilla Lame, MD;  Location: [facility];  Service: Endoscopy;  Laterality: N/A;  . ESOPHAGOGASTRODUODENOSCOPY (EGD) WITH PROPOFOL N/A 03/21/2018   Procedure: ESOPHAGOGASTRODUODENOSCOPY (EGD) WITH PROPOFOL;  Surgeon: Lucilla Lame, MD;  Location: [facility] ENDOSCOPY;  Service: Endoscopy;  Laterality: N/A;  . GIVENS  CAPSULE STUDY N/A 09/24/2019   Procedure: GIVENS CAPSULE STUDY;  Surgeon: Virgel Manifold, MD;  Location: [facility] ENDOSCOPY;  Service: Endoscopy;  Laterality: N/A;  . JOINT REPLACEMENT    . KNEE SURGERY Left   . ROTATOR CUFF REPAIR Right   . spinal neurostimulator      Family Psychiatric History: I have reviewed family psychiatric history from my progress note on 08/16/2017.  Family History:  Family History  Problem Relation Age of Onset  . Diabetes Mother   . Hyperlipidemia Mother   . Hypertension Mother   . COPD Mother   . CVA Mother   . Anemia Mother   . Diabetes Father   . Hyperlipidemia Father   . Hypertension Father   . Thyroid disease Father   . Alcohol abuse Father   . Peripheral vascular disease Sister   . Hypertension Sister   . Anxiety disorder Son   . Depression Son   . Bipolar disorder Son   . Seizures Son     Social History: Reviewed social history from my progress note on 08/16/2017. Social History   Socioeconomic History  . Marital status: Divorced    Spouse name: Not on file  . Number of children: 1  . Years of education: Not on file  . Highest education level: High school graduate  Occupational History    Comment: disablitiy  Tobacco Use  . Smoking status: Never Smoker  . Smokeless tobacco: Never Used  Vaping Use  . Vaping Use: Never used  Substance and Sexual Activity  . Alcohol use: No  . Drug use: No  . Sexual activity: Not Currently  Other Topics Concern  . Not on file  Social History Narrative  . Not on file   Social Determinants of Health   Financial Resource Strain: Not on file  Food Insecurity: Not on file  Transportation Needs: Not on file  Physical Activity: Not on file  Stress: Not on file  Social Connections: Not on file    Allergies:  Allergies  Allergen Reactions  . Amoxapine Itching  . Amoxicillin Itching  . Dapagliflozin Itching    Other reaction(s): Other (See Comments) Thrush & vaginal itching  . Keflex  [Cephalexin] Itching  . Mirtazapine Other (See Comments)    Pt states felling "like my body is outside of me."    Metabolic Disorder Labs: Lab Results  Component Value Date   HGBA1C 9.9 (H) 06/21/2017   MPG 237.43 06/21/2017   Lab Results  Component Value Date   PROLACTIN 5.5 09/06/2017   Lab Results  Component Value Date   CHOL 277 (H) 06/21/2017   TRIG 196 (H) 06/21/2017   HDL 64 06/21/2017   CHOLHDL 4.3 06/21/2017   VLDL 39 06/21/2017   LDLCALC 174 (H) 06/21/2017   Lab Results  Component Value Date   TSH 2.890 09/06/2017   TSH 3.176 01/04/2017    Therapeutic Level Labs: No results found for:  LITHIUM No results found for: VALPROATE No components found for:  CBMZ  Current Medications: Current Outpatient Medications  Medication Sig Dispense Refill  . ACCU-CHEK AVIVA PLUS test strip     . ACCU-CHEK SOFTCLIX LANCETS lancets     . acetaZOLAMIDE (DIAMOX) 250 MG tablet     . AIMOVIG 70 MG/ML SOAJ Inject 70 mg into the skin every 28 (twenty-eight) days.    Marland Kitchen albuterol (VENTOLIN HFA) 108 (90 Base) MCG/ACT inhaler Inhale 2 puffs into the lungs every 6 (six) hours as needed for wheezing or shortness of breath. 1 g 0  . ARIPiprazole (ABILIFY) 5 MG tablet Take 0.5 tablets (2.5 mg total) by mouth daily. 90 tablet 1  . aspirin-acetaminophen-caffeine (EXCEDRIN MIGRAINE) 250-250-65 MG tablet Take 2 tablets by mouth every 8 (eight) hours as needed for headache or migraine.     Marland Kitchen atorvastatin (LIPITOR) 80 MG tablet Take 80 mg by mouth daily at 6 PM.    . Blood Glucose Monitoring Suppl (ACCU-CHEK AVIVA PLUS) w/Device KIT     . Carboxymethylcellulose Sod PF 0.5 % SOLN Apply 1 drop to eye daily as needed.    . Cholecalciferol (VITAMIN D3) 1000 units CAPS Take 1 capsule by mouth daily.    . clotrimazole (LOTRIMIN) 1 % cream Apply 1 application topically daily as needed.    . cyclobenzaprine (FLEXERIL) 5 MG tablet Take 5 mg by mouth 3 (three) times daily as needed.     . dabigatran  (PRADAXA) 150 MG CAPS capsule Take 150 mg by mouth 2 (two) times daily.    Marland Kitchen diltiazem (CARDIZEM CD) 240 MG 24 hr capsule Take by mouth. Take 1 capsule by mouth once daily    . DULoxetine (CYMBALTA) 30 MG capsule Take 1 capsule (30 mg total) by mouth daily. To be combined with 60 mg 90 capsule 0  . DULoxetine (CYMBALTA) 60 MG capsule Take 1 capsule (60 mg total) by mouth daily. To be combined with 30 mg 90 capsule 0  . esomeprazole (NEXIUM) 40 MG capsule Take 40 mg by mouth daily.    . famotidine (PEPCID) 40 MG tablet Take 40 mg by mouth 2 (two) times daily.    . ferrous sulfate 325 (65 FE) MG tablet Take by mouth. Take 325 mg daily with breakfast    . fluticasone (FLONASE) 50 MCG/ACT nasal spray Place 2 sprays into both nostrils as needed.     . furosemide (LASIX) 20 MG tablet Take 20 mg by mouth daily.    Marland Kitchen gabapentin (NEURONTIN) 600 MG tablet Take 1 tablet (600 mg total) by mouth 2 (two) times daily. 180 tablet 0  . hydrOXYzine (VISTARIL) 25 MG capsule TAKE (1) CAPSULE BY MOUTH TWICE DAILY ASNEEDED FOR SEVERE ANXIETY ATTACKS. 180 capsule 0  . Insulin Degludec (TRESIBA FLEXTOUCH) 200 UNIT/ML SOPN Inject 200 Units into the skin daily.    . insulin lispro (HUMALOG) 100 UNIT/ML KwikPen Inject up to 50 units daily in divided doses, as directed    . Insulin Pen Needle (BD ULTRA-FINE PEN NEEDLES) 29G X 12.7MM MISC Use 3 times daily as directed    . lamoTRIgine (LAMICTAL) 150 MG tablet Take by mouth.    . levothyroxine (SYNTHROID, LEVOTHROID) 100 MCG tablet Take 100 mcg by mouth daily.    Marland Kitchen losartan (COZAAR) 25 MG tablet Take 12.5 mg by mouth daily.    . Magnesium 250 MG TABS Take 1 tablet by mouth daily.    . Melatonin 10 MG TABS Take 2 tablets by mouth  at bedtime.    . meloxicam (MOBIC) 15 MG tablet Take 15 mg by mouth daily.    . metFORMIN (GLUCOPHAGE-XR) 500 MG 24 hr tablet Take 500 mg by mouth 2 (two) times daily.     . metoprolol succinate (TOPROL-XL) 25 MG 24 hr tablet Take 1 tablet by mouth  daily.    . mupirocin ointment (BACTROBAN) 2 % SMARTSIG:1 Application Topical 2-3 Times Daily    . naloxone (NARCAN) nasal spray 4 mg/0.1 mL 1 spray into nose for opioid overdose. If needed, may repeat spray in 2-3 min    . NUCYNTA ER 100 MG 12 hr tablet Take 1 tablet by mouth 2 (two) times daily.    Marland Kitchen nystatin (MYCOSTATIN) 100000 UNIT/ML suspension Take 5 mLs by mouth 4 (four) times daily.    Marland Kitchen omeprazole (PRILOSEC) 20 MG capsule TAKE (1) CAPSULE BY MOUTH EVERY DAY 30 capsule 0  . ondansetron (ZOFRAN) 4 MG tablet Take 4 mg by mouth at bedtime.    Marland Kitchen oxyCODONE (OXY IR/ROXICODONE) 5 MG immediate release tablet Take 5 mg by mouth 2 (two) times daily as needed.    . pioglitazone (ACTOS) 15 MG tablet Take by mouth. Take 1 tablet by mouth once daily    . promethazine-dextromethorphan (PROMETHAZINE-DM) 6.25-15 MG/5ML syrup Take 5 mLs by mouth 4 (four) times daily as needed.    . quinapril (ACCUPRIL) 10 MG tablet Take 10 mg by mouth daily.    . Riboflavin 400 MG TABS Take 1 tablet by mouth every night  for headaches    . Rimegepant Sulfate (NURTEC) 75 MG TBDP Take 1 tablet by mouth as needed.     . traZODone (DESYREL) 100 MG tablet Take 1.5-2 tablets (150-200 mg total) by mouth at bedtime as needed for sleep. 180 tablet 0  . TRESIBA FLEXTOUCH 200 UNIT/ML SOPN Inject 76 Units into the skin at bedtime.     No current facility-administered medications for this visit.     Musculoskeletal: Strength & Muscle Tone: UTA Gait & Station: UTA Patient leans: N/A  Psychiatric Specialty Exam: Review of Systems  Musculoskeletal: Positive for back pain.  Psychiatric/Behavioral: Negative for agitation, behavioral problems, confusion, decreased concentration, dysphoric mood, hallucinations, self-injury, sleep disturbance and suicidal ideas. The patient is not nervous/anxious and is not hyperactive.   All other systems reviewed and are negative.   There were no vitals taken for this visit.There is no height or  weight on file to calculate BMI.  General Appearance: Casual  Eye Contact:  Fair  Speech:  Clear and Coherent  Volume:  Normal  Mood:  Euthymic  Affect:  Congruent  Thought Process:  Goal Directed and Descriptions of Associations: Intact  Orientation:  Full (Time, Place, and Person)  Thought Content: Logical   Suicidal Thoughts:  No  Homicidal Thoughts:  No  Memory:  Immediate;   Fair Recent;   Fair Remote;   Fair  Judgement:  Fair  Insight:  Fair  Psychomotor Activity:  Normal  Concentration:  Concentration: Fair and Attention Span: Fair  Recall:  [employer] of Knowledge: Fair  Language: Fair  Akathisia:  No  Handed:  Right  AIMS (if indicated): not done  Assets:  Communication Skills Desire for Improvement Housing Social Support  ADL's:  Intact  Cognition: WNL  Sleep:  Fair   Screenings: PHQ2-9   Flowsheet Row Video Visit from 08/04/2020 in [facility] Video Visit from 07/07/2020 in [facility]  PHQ-2 Total Score 1 1  Flowsheet Row Video Visit from 08/04/2020 in [facility] Video Visit from 07/07/2020 in [facility] No Risk No Risk       Assessment and Plan: Christine Mcneil is a 55 year old Caucasian female who has a history of MDD, PTSD, migraine headaches, history of CVA, iron deficiency, prothrombin gene mutation was evaluated by telemedicine today.  Patient is currently making progress with regards to her mood.  She does struggle with pain and has upcoming appointment with her pain provider.  Plan as noted below.  Plan MDD in full remission PHQ 9 today equals 1 Cymbalta 90 mg p.o. daily Abilify at reduced dose of 2.5 mg p.o. daily.  Long-term plan is to taper it off.  GAD-improving Gabapentin 600 mg p.o. twice daily Cymbalta 90 mg p.o. daily Hydroxyzine 25 mg p.o. daily as needed for anxiety attacks  PTSD-stable Continue  CBT as needed  Insomnia-improving Trazodone 150-200 mg p.o. nightly Melatonin as prescribed Gabapentin will also help.  Follow-up in clinic in 1 month or sooner if needed.  This note was generated in part or whole with voice recognition software. Voice recognition is usually quite accurate but there are transcription errors that can and very often do occur. I apologize for any typographical errors that were not detected and corrected.      Ursula Alert, MD 08/05/2020, 9:46 AM

## 2020-08-11 ENCOUNTER — Telehealth: Payer: Self-pay | Admitting: *Deleted

## 2020-08-11 DIAGNOSIS — R748 Abnormal levels of other serum enzymes: Secondary | ICD-10-CM

## 2020-08-11 NOTE — Telephone Encounter (Signed)
Called patient to inform her that we need to reschedule appointment t that was scheduledfor 09/08/20 in [place] d/t provider scheduled for procedures that day. No answer LVM for patient to call back. ([place] office if still available please schedule for 4/12).

## 2020-08-12 ENCOUNTER — Other Ambulatory Visit
Admission: RE | Admit: 2020-08-12 | Discharge: 2020-08-12 | Disposition: A | Discharge status: Home / Self Care | Payer: Medicare Other | Attending: Gastroenterology | Admitting: Gastroenterology

## 2020-08-12 ENCOUNTER — Other Ambulatory Visit: Payer: Self-pay

## 2020-08-12 DIAGNOSIS — D509 Iron deficiency anemia, unspecified: Secondary | ICD-10-CM | POA: Diagnosis present

## 2020-08-12 LAB — CBC WITH DIFFERENTIAL/PLATELET
Abs Immature Granulocytes: 0.02 10*3/uL (ref 0.00–0.07)
Basophils Absolute: 0 10*3/uL (ref 0.0–0.1)
Basophils Relative: 1 %
Eosinophils Absolute: 0.2 10*3/uL (ref 0.0–0.5)
Eosinophils Relative: 2 %
HCT: 41.7 % (ref 36.0–46.0)
Hemoglobin: 13.6 g/dL (ref 12.0–15.0)
Immature Granulocytes: 0 %
Lymphocytes Relative: 33 %
Lymphs Abs: 2.2 10*3/uL (ref 0.7–4.0)
MCH: 28.2 pg (ref 26.0–34.0)
MCHC: 32.6 g/dL (ref 30.0–36.0)
MCV: 86.3 fL (ref 80.0–100.0)
Monocytes Absolute: 0.3 10*3/uL (ref 0.1–1.0)
Monocytes Relative: 5 %
Neutro Abs: 3.9 10*3/uL (ref 1.7–7.7)
Neutrophils Relative %: 59 %
Platelets: 212 10*3/uL (ref 150–400)
RBC: 4.83 MIL/uL (ref 3.87–5.11)
RDW: 14.8 % (ref 11.5–15.5)
WBC: 6.7 10*3/uL (ref 4.0–10.5)
nRBC: 0 % (ref 0.0–0.2)

## 2020-08-12 LAB — FERRITIN: Ferritin: 65 ng/mL (ref 11–307)

## 2020-08-13 ENCOUNTER — Telehealth: Payer: Self-pay

## 2020-08-13 NOTE — Telephone Encounter (Signed)
LVM for pt to return my call to schedule ERCP.

## 2020-08-13 NOTE — Telephone Encounter (Signed)
-----   Message from [place], [place] sent at 08/12/2020 11:12 AM EDT ----- Regarding: ERCP Christine Mcneil, patient is in agreeable of doing the ERCP. Can you please schedule and let the patient know if you can. If not,  just let me know.  Thank you!  ----- Message ----- From: Virgel Manifold, MD Sent: 08/01/2020  10:07 AM EDT To: Wayna Chalet, [place] please let the patient know, she has a stone in her bile duct and needs an ERCP. Please schedule with Dr. Allen Norris

## 2020-08-14 ENCOUNTER — Other Ambulatory Visit: Payer: Self-pay

## 2020-08-14 ENCOUNTER — Telehealth: Payer: Self-pay

## 2020-08-14 DIAGNOSIS — K8050 Calculus of bile duct without cholangitis or cholecystitis without obstruction: Secondary | ICD-10-CM

## 2020-08-14 DIAGNOSIS — K838 Other specified diseases of biliary tract: Secondary | ICD-10-CM

## 2020-08-14 NOTE — Telephone Encounter (Signed)
-----   Message from [place], [place] sent at 08/12/2020 11:12 AM EDT ----- Regarding: ERCP Kathia C[place], patient is in agreeable of doing the ERCP. Can you please schedule and let the patient know if you can. If not,  just let me know.  Thank you!  ----- Message ----- From: Virgel Manifold, MD Sent: 08/01/2020  10:07 AM EDT To: Wayna Chalet, [place] please let the patient know, she has a stone in her bile duct and needs an ERCP. Please schedule with Dr. Allen Norris

## 2020-08-14 NOTE — Telephone Encounter (Signed)
ERCP with [facility] at [facility] on 08/19/20. Instructions have been sent to pt through [place]. Pt verbalized understanding of instructions, date, preparation for her procedure.

## 2020-08-15 ENCOUNTER — Other Ambulatory Visit
Admission: RE | Admit: 2020-08-15 | Discharge: 2020-08-15 | Disposition: A | Discharge status: Home / Self Care | Payer: Medicare Other | Source: Ambulatory Visit | Attending: Gastroenterology | Admitting: Gastroenterology

## 2020-08-15 ENCOUNTER — Other Ambulatory Visit: Payer: Self-pay

## 2020-08-15 DIAGNOSIS — Z20822 Contact with and (suspected) exposure to covid-19: Secondary | ICD-10-CM | POA: Insufficient documentation

## 2020-08-15 DIAGNOSIS — Z01812 Encounter for preprocedural laboratory examination: Secondary | ICD-10-CM | POA: Diagnosis present

## 2020-08-15 LAB — GAMMA GT: GGT: 70 IU/L — ABNORMAL HIGH (ref 0–60)

## 2020-08-15 LAB — SARS CORONAVIRUS 2 (TAT 6-24 HRS): SARS Coronavirus 2: NEGATIVE

## 2020-08-15 NOTE — Telephone Encounter (Signed)
Pt returned my call and we have scheduled her for an ERCP on Tuesday, March 29th.

## 2020-08-18 ENCOUNTER — Encounter: Payer: Self-pay | Admitting: Gastroenterology

## 2020-08-19 ENCOUNTER — Encounter: Payer: Self-pay | Admitting: Gastroenterology

## 2020-08-19 ENCOUNTER — Ambulatory Visit: Payer: Medicare Other | Admitting: Anesthesiology

## 2020-08-19 ENCOUNTER — Encounter
Admission: RE | Disposition: A | Discharge status: Home / Self Care | Payer: Self-pay | Source: Home / Self Care | Attending: Gastroenterology

## 2020-08-19 ENCOUNTER — Ambulatory Visit
Admission: RE | Admit: 2020-08-19 | Discharge: 2020-08-19 | Disposition: A | Discharge status: Home / Self Care | Payer: Medicare Other | Attending: Gastroenterology | Admitting: Gastroenterology

## 2020-08-19 ENCOUNTER — Ambulatory Visit: Payer: Medicare Other

## 2020-08-19 DIAGNOSIS — Z79899 Other long term (current) drug therapy: Secondary | ICD-10-CM | POA: Diagnosis not present

## 2020-08-19 DIAGNOSIS — Z79890 Hormone replacement therapy (postmenopausal): Secondary | ICD-10-CM | POA: Insufficient documentation

## 2020-08-19 DIAGNOSIS — Z888 Allergy status to other drugs, medicaments and biological substances status: Secondary | ICD-10-CM | POA: Insufficient documentation

## 2020-08-19 DIAGNOSIS — Z880 Allergy status to penicillin: Secondary | ICD-10-CM | POA: Insufficient documentation

## 2020-08-19 DIAGNOSIS — K838 Other specified diseases of biliary tract: Secondary | ICD-10-CM

## 2020-08-19 DIAGNOSIS — E119 Type 2 diabetes mellitus without complications: Secondary | ICD-10-CM | POA: Insufficient documentation

## 2020-08-19 DIAGNOSIS — Z8249 Family history of ischemic heart disease and other diseases of the circulatory system: Secondary | ICD-10-CM | POA: Diagnosis not present

## 2020-08-19 DIAGNOSIS — D6852 Prothrombin gene mutation: Secondary | ICD-10-CM | POA: Diagnosis not present

## 2020-08-19 DIAGNOSIS — Z7984 Long term (current) use of oral hypoglycemic drugs: Secondary | ICD-10-CM | POA: Diagnosis not present

## 2020-08-19 DIAGNOSIS — Z90710 Acquired absence of both cervix and uterus: Secondary | ICD-10-CM | POA: Diagnosis not present

## 2020-08-19 DIAGNOSIS — I10 Essential (primary) hypertension: Secondary | ICD-10-CM | POA: Insufficient documentation

## 2020-08-19 DIAGNOSIS — K8050 Calculus of bile duct without cholangitis or cholecystitis without obstruction: Secondary | ICD-10-CM | POA: Insufficient documentation

## 2020-08-19 DIAGNOSIS — E785 Hyperlipidemia, unspecified: Secondary | ICD-10-CM | POA: Diagnosis not present

## 2020-08-19 DIAGNOSIS — Z833 Family history of diabetes mellitus: Secondary | ICD-10-CM | POA: Diagnosis not present

## 2020-08-19 DIAGNOSIS — R932 Abnormal findings on diagnostic imaging of liver and biliary tract: Secondary | ICD-10-CM | POA: Diagnosis not present

## 2020-08-19 DIAGNOSIS — G4733 Obstructive sleep apnea (adult) (pediatric): Secondary | ICD-10-CM | POA: Diagnosis not present

## 2020-08-19 DIAGNOSIS — Z9682 Presence of neurostimulator: Secondary | ICD-10-CM | POA: Diagnosis not present

## 2020-08-19 DIAGNOSIS — E039 Hypothyroidism, unspecified: Secondary | ICD-10-CM | POA: Diagnosis not present

## 2020-08-19 DIAGNOSIS — Z794 Long term (current) use of insulin: Secondary | ICD-10-CM | POA: Diagnosis not present

## 2020-08-19 DIAGNOSIS — Z881 Allergy status to other antibiotic agents status: Secondary | ICD-10-CM | POA: Diagnosis not present

## 2020-08-19 DIAGNOSIS — Z8673 Personal history of transient ischemic attack (TIA), and cerebral infarction without residual deficits: Secondary | ICD-10-CM | POA: Insufficient documentation

## 2020-08-19 DIAGNOSIS — Z7982 Long term (current) use of aspirin: Secondary | ICD-10-CM | POA: Diagnosis not present

## 2020-08-19 DIAGNOSIS — Z9049 Acquired absence of other specified parts of digestive tract: Secondary | ICD-10-CM | POA: Diagnosis not present

## 2020-08-19 HISTORY — DX: Hypothyroidism, unspecified: E03.9

## 2020-08-19 HISTORY — PX: ERCP: SHX5425

## 2020-08-19 LAB — GLUCOSE, CAPILLARY: Glucose-Capillary: 163 mg/dL — ABNORMAL HIGH (ref 70–99)

## 2020-08-19 SURGERY — ERCP, WITH INTERVENTION IF INDICATED
Anesthesia: General

## 2020-08-19 MED ORDER — PHENYLEPHRINE HCL (PRESSORS) 10 MG/ML IV SOLN
INTRAVENOUS | Status: AC
  Filled 2020-08-19: qty 1

## 2020-08-19 MED ORDER — FENTANYL CITRATE (PF) 100 MCG/2ML IJ SOLN
INTRAMUSCULAR | Status: AC
  Filled 2020-08-19: qty 2

## 2020-08-19 MED ORDER — ROCURONIUM BROMIDE 100 MG/10ML IV SOLN
INTRAVENOUS | Status: DC | PRN
  Administered 2020-08-19: 30 mg via INTRAVENOUS

## 2020-08-19 MED ORDER — LIDOCAINE HCL (CARDIAC) PF 100 MG/5ML IV SOSY
PREFILLED_SYRINGE | INTRAVENOUS | Status: DC | PRN
  Administered 2020-08-19: 60 mg via INTRAVENOUS

## 2020-08-19 MED ORDER — SUGAMMADEX SODIUM 200 MG/2ML IV SOLN
INTRAVENOUS | Status: DC | PRN
  Administered 2020-08-19: 400 mg via INTRAVENOUS

## 2020-08-19 MED ORDER — LIDOCAINE HCL (PF) 2 % IJ SOLN
INTRAMUSCULAR | Status: AC
  Filled 2020-08-19: qty 5

## 2020-08-19 MED ORDER — PROPOFOL 10 MG/ML IV BOLUS
INTRAVENOUS | Status: DC | PRN
  Administered 2020-08-19: 160 mg via INTRAVENOUS

## 2020-08-19 MED ORDER — LACTATED RINGERS IV SOLN: INTRAVENOUS | Status: DC

## 2020-08-19 MED ORDER — SUCCINYLCHOLINE CHLORIDE 20 MG/ML IJ SOLN
INTRAMUSCULAR | Status: DC | PRN
  Administered 2020-08-19: 120 mg via INTRAVENOUS

## 2020-08-19 MED ORDER — ALBUTEROL SULFATE HFA 108 (90 BASE) MCG/ACT IN AERS
INHALATION_SPRAY | RESPIRATORY_TRACT | Status: DC | PRN
  Administered 2020-08-19: 4 via RESPIRATORY_TRACT

## 2020-08-19 MED ORDER — SODIUM CHLORIDE 0.9 % IV SOLN: INTRAVENOUS | Status: DC

## 2020-08-19 MED ORDER — FENTANYL CITRATE (PF) 100 MCG/2ML IJ SOLN
INTRAMUSCULAR | Status: DC | PRN
  Administered 2020-08-19: 25 ug via INTRAVENOUS

## 2020-08-19 NOTE — Transfer of Care (Signed)
Immediate Anesthesia Transfer of Care Note  Patient: Christine Mcneil  Procedure(s) Performed: ENDOSCOPIC RETROGRADE CHOLANGIOPANCREATOGRAPHY (ERCP) (N/A )  Patient Location: PACU  Anesthesia Type:General  Level of Consciousness: sedated  Airway & Oxygen Therapy: Patient Spontanous Breathing and Patient connected to face mask oxygen  Post-op Assessment: Report given to RN and Post -op Vital signs reviewed and stable  Post vital signs: Reviewed and stable  Last Vitals:  Vitals Value Taken Time  BP 121/73 08/19/20 1300  Temp    Pulse 76 08/19/20 1305  Resp 17 08/19/20 1305  SpO2 99 % 08/19/20 1305  Vitals shown include unvalidated device data.  Last Pain:  Vitals:   08/19/20 1052  TempSrc: Skin  PainSc: 5          Complications: No complications documented.

## 2020-08-19 NOTE — H&P (Signed)
Lucilla Lame, MD [address] [place], [place] [address] Phone:[phone] Fax : [phone]  Primary Care Physician:  Doughton, Latricia Heft, MD Primary Gastroenterologist:  Dr. Allen Norris  Pre-Procedure History & Physical: HPI:  Christine Mcneil is a 54 y.o. female is here for an ERCP.   Past Medical History:  Diagnosis Date  . Abdominal pain   . Anxiety and depression   . Atypical facial pain   . Bilateral occipital neuralgia   . Cervical spondylosis without myelopathy   . Chronic daily headache   . Chronic pain   . Chronic pelvic pain in female   . Chronic thoracic back pain   . Depression   . Diabetes mellitus without complication ([place])   . Diabetes mellitus, type II ([place])   . Dysphagia   . Dysrhythmia   . Fibromyalgia   . Hyperlipidemia   . Hypertension   . Hypothyroidism   . Insomnia   . Iron deficiency anemia   . Left hip pain   . Low back pain   . Migraines   . Numbness   . Optic neuropathy   . OSA (obstructive sleep apnea)   . Polyarthralgia   . Primary osteoarthritis of both hips   . Prothrombin gene mutation ([place])   . Pseudotumor cerebri   . Rectal bleeding   . Right shoulder pain   . Rotator cuff syndrome   . Stroke ([place])   . Thyroid disease   . TIA (transient ischemic attack) 05/27/2017    Past Surgical History:  Procedure Laterality Date  . ABDOMINAL HYSTERECTOMY     total  . CHOLECYSTECTOMY    . COLONOSCOPY WITH PROPOFOL N/A 03/21/2018   Procedure: COLONOSCOPY WITH PROPOFOL;  Surgeon: Lucilla Lame, MD;  Location[facility] ENDOSCOPY;  Service: Endoscopy;  Laterality: N/A;  . ESOPHAGOGASTRODUODENOSCOPY (EGD) WITH PROPOFOL N/A 03/21/2018   Procedure: ESOPHAGOGASTRODUODENOSCOPY (EGD) WITH PROPOFOL;  Surgeon: Lucilla Lame, MD;  Location: [facility] ENDOSCOPY;  Service: Endoscopy;  Laterality: N/A;  . GIVENS CAPSULE STUDY N/A 09/24/2019   Procedure: GIVENS CAPSULE STUDY;  Surgeon: Virgel Manifold, MD;  Location: [facility] ENDOSCOPY;  Service: Endoscopy;   Laterality: N/A;  . JOINT REPLACEMENT    . KNEE SURGERY Left   . ROTATOR CUFF REPAIR Right   . spinal neurostimulator      Prior to Admission medications   Medication Sig Start Date End Date Taking? Authorizing Provider  acetaZOLAMIDE (DIAMOX) 250 MG tablet  06/09/20  Yes [provider]  ARIPiprazole (ABILIFY) 5 MG tablet Take 0.5 tablets (2.5 mg total) by mouth daily. 08/04/20  Yes Ursula Alert, MD  atorvastatin (LIPITOR) 80 MG tablet Take 80 mg by mouth daily at 6 PM. 12/22/17  Yes [provider]  cyclobenzaprine (FLEXERIL) 5 MG tablet Take 5 mg by mouth 3 (three) times daily as needed.  12/08/17  Yes [provider]  dabigatran (PRADAXA) 150 MG CAPS capsule Take 150 mg by mouth 2 (two) times daily. 10/12/16 08/19/20 Yes [provider]  diltiazem (CARDIZEM CD) 240 MG 24 hr capsule Take by mouth. Take 1 capsule by mouth once daily 12/26/17 08/19/20 Yes [provider]  DULoxetine (CYMBALTA) 30 MG capsule Take 1 capsule (30 mg total) by mouth daily. To be combined with 60 mg 06/17/20  Yes Eappen, Ria Clock, MD  esomeprazole (NEXIUM) 40 MG capsule Take 40 mg by mouth daily.   Yes [provider]  famotidine (PEPCID) 40 MG tablet Take 40 mg by mouth 2 (two) times daily.  10/10/19  Yes [provider]  ferrous sulfate 325 (65 FE) MG tablet Take by mouth. Take 325 mg daily with breakfast   Yes [provider]  gabapentin (NEURONTIN) 600 MG tablet Take 1 tablet (600 mg total) by mouth 2 (two) times daily. 06/17/20  Yes Ursula Alert, MD  hydrOXYzine (VISTARIL) 25 MG capsule TAKE (1) CAPSULE BY MOUTH TWICE DAILY ASNEEDED FOR SEVERE ANXIETY ATTACKS. 07/08/20  Yes Eappen, Ria Clock, MD  Insulin Degludec (TRESIBA FLEXTOUCH) 200 UNIT/ML SOPN Inject 200 Units into the skin daily. 11/22/18  Yes [provider]  insulin lispro (HUMALOG) 100 UNIT/ML KwikPen Inject up to 50 units daily in divided doses, as directed 04/14/20  Yes [provider]  lamoTRIgine (LAMICTAL) 150 MG tablet Take by mouth. 07/08/20  Yes [provider]  levothyroxine (SYNTHROID, LEVOTHROID) 100 MCG tablet Take 100 mcg by mouth daily. 08/18/15  Yes [provider]  losartan (COZAAR) 25 MG tablet Take 12.5 mg by mouth daily. 04/11/20  Yes [provider]  Magnesium 250 MG TABS Take 1 tablet by mouth daily.   Yes [provider]  meloxicam (MOBIC) 15 MG tablet Take 15 mg by mouth daily. 06/09/20  Yes [provider]  metFORMIN (GLUCOPHAGE-XR) 500 MG 24 hr tablet Take 500 mg by mouth 2 (two) times daily.  02/17/18  Yes [provider]  metoprolol succinate (TOPROL-XL) 25 MG 24 hr tablet Take 1 tablet by mouth daily. 05/13/20  Yes [provider]  NUCYNTA ER 100 MG 12 hr tablet Take 1 tablet by mouth 2 (two) times daily. 05/30/17  Yes [provider]  omeprazole (PRILOSEC) 20 MG capsule TAKE (1) CAPSULE BY MOUTH EVERY DAY 08/30/19  Yes [name] B, MD  oxyCODONE (OXY IR/ROXICODONE) 5 MG immediate release tablet Take 5 mg by mouth 2 (two) times daily as needed. 06/06/20  Yes [provider]  pioglitazone (ACTOS) 15 MG tablet Take by mouth. Take 1 tablet by mouth once daily 11/07/17 08/19/20 Yes [provider]  quinapril (ACCUPRIL) 10 MG tablet Take 10 mg by mouth daily. 05/01/13  Yes [provider]  Riboflavin 400 MG TABS Take 1 tablet by mouth every night  for headaches 06/11/20  Yes [provider]  traZODone (DESYREL) 100 MG tablet Take 1.5-2 tablets (150-200 mg total) by mouth at bedtime as needed for sleep. 07/07/20  Yes Eappen, Ria Clock, MD  TRESIBA FLEXTOUCH 200 UNIT/ML SOPN Inject 76 Units into the skin at bedtime. 11/22/18  Yes [provider]  ACCU-CHEK AVIVA PLUS test strip  03/27/18   [provider]  ACCU-CHEK SOFTCLIX LANCETS lancets  11/07/17   [provider]  AIMOVIG 70 MG/ML SOAJ Inject 70 mg into the skin every  28 (twenty-eight) days. 06/15/17   [provider]  albuterol (VENTOLIN HFA) 108 (90 Base) MCG/ACT inhaler Inhale 2 puffs into the lungs every 6 (six) hours as needed for wheezing or shortness of breath. 03/08/19   Gregor Hams, MD  aspirin-acetaminophen-caffeine (EXCEDRIN MIGRAINE) [phone] MG tablet Take 2 tablets by mouth every 8 (eight) hours as needed for headache or migraine.     [provider]  Blood Glucose Monitoring Suppl (ACCU-CHEK AVIVA PLUS) w/Device KIT  11/07/17   [provider]  Carboxymethylcellulose Sod PF 0.5 % SOLN Apply 1 drop to eye daily as needed. 05/15/15   [provider]  Cholecalciferol (VITAMIN D3) 1000 units CAPS Take 1 capsule by mouth daily.    [provider]  clotrimazole (  LOTRIMIN) 1 % cream Apply 1 application topically daily as needed. 12/05/17   [provider]  DULoxetine (CYMBALTA) 60 MG capsule Take 1 capsule (60 mg total) by mouth daily. To be combined with 30 mg 06/17/20   Ursula Alert, MD  fluticasone (FLONASE) 50 MCG/ACT nasal spray Place 2 sprays into both nostrils as needed.  06/25/17   [provider]  furosemide (LASIX) 20 MG tablet Take 20 mg by mouth daily. 06/26/19 06/25/20  [provider]  Insulin Pen Needle (BD ULTRA-FINE PEN NEEDLES) 29G X 12.7MM MISC Use 3 times daily as directed 03/07/20   [provider]  Melatonin 10 MG TABS Take 2 tablets by mouth at bedtime. 08/15/19   [provider]  mupirocin ointment (BACTROBAN) 2 % SMARTSIG:1 Application Topical 2-3 Times Daily 07/11/20   [provider]  naloxone [facility]) nasal spray 4 mg/0.1 mL 1 spray into nose for opioid overdose. If needed, may repeat spray in 2-3 min 03/14/18   [provider]  nystatin (MYCOSTATIN) 100000 UNIT/ML suspension Take 5 mLs by mouth 4 (four) times daily. 09/06/19   [provider]  ondansetron (ZOFRAN) 4 MG tablet Take 4 mg by mouth at bedtime. 07/14/20    [provider]  promethazine-dextromethorphan (PROMETHAZINE-DM) 6.25-15 MG/5ML syrup Take 5 mLs by mouth 4 (four) times daily as needed. 05/22/20   [provider]  Rimegepant Sulfate (NURTEC) 75 MG TBDP Take 1 tablet by mouth as needed.  01/12/19   [provider]    Allergies as of 08/14/2020 - Review Complete 08/04/2020  Allergen Reaction Noted  . Amoxapine Itching 09/16/2015  . Amoxicillin Itching   . Dapagliflozin Itching 11/07/2017  . Keflex [cephalexin] Itching 09/16/2015  . Mirtazapine Other (See Comments) 06/08/2019    Family History  Problem Relation Age of Onset  . Diabetes Mother   . Hyperlipidemia Mother   . Hypertension Mother   . COPD Mother   . CVA Mother   . Anemia Mother   . Diabetes Father   . Hyperlipidemia Father   . Hypertension Father   . Thyroid disease Father   . Alcohol abuse Father   . Peripheral vascular disease Sister   . Hypertension Sister   . Anxiety disorder Son   . Depression Son   . Bipolar disorder Son   . Seizures Son     Social History   Socioeconomic History  . Marital status: Divorced    Spouse name: Not on file  . Number of children: 1  . Years of education: Not on file  . Highest education level: High school graduate  Occupational History    Comment: disablitiy  Tobacco Use  . Smoking status: Never Smoker  . Smokeless tobacco: Never Used  Vaping Use  . Vaping Use: Never used  Substance and Sexual Activity  . Alcohol use: No  . Drug use: No  . Sexual activity: Not Currently  Other Topics Concern  . Not on file  Social History Narrative  . Not on file   Social Determinants of Health   Financial Resource Strain: Not on file  Food Insecurity: Not on file  Transportation Needs: Not on file  Physical Activity: Not on file  Stress: Not on file  Social Connections: Not on file  Intimate Partner Violence: Not on file    Review of Systems: See HPI, otherwise negative ROS  Physical  Exam: BP 105/67   Pulse 75   Temp (!) 96.1 F (35.6 C) (Skin)  Ht 5' 6"  (1.676 m)   Wt 113.4 kg   LMP  (LMP Unknown)   SpO2 95%   BMI 40.35 kg/m  General:   Alert,  pleasant and cooperative in NAD Head:  Normocephalic and atraumatic. Neck:  Supple; no masses or thyromegaly. Lungs:  Clear throughout to auscultation.    Heart:  Regular rate and rhythm. Abdomen:  Soft, nontender and nondistended. Normal bowel sounds, without guarding, and without rebound.   Neurologic:  Alert and  oriented x4;  grossly normal neurologically.  Impression/Plan: Christine Mcneil is here for an ERCP to be performed for choledocholithiasis  Risks, benefits, limitations, and alternatives regarding  ERCP have been reviewed with the patient.  Questions have been answered.  All parties agreeable.   Lucilla Lame, MD  08/19/2020, 10:57 AM

## 2020-08-19 NOTE — Op Note (Signed)
[facility] Gastroenterology Patient Name: Christine Mcneil Procedure Date: 08/19/2020 11:50 AM MRN: [id number] Account #: [id number] Date of Birth: [birth year] Admit Type: Outpatient Age: 55 Room: [facility] ENDO ROOM 4 Gender: Female Note Status: Finalized Procedure:             ERCP Indications:           Common bile duct stone(s) Providers:             Lucilla Lame MD, MD Referring MD:          No Local Md, MD (Referring MD) Medicines:             General Anesthesia Complications:         No immediate complications. Procedure:             Pre-Anesthesia Assessment:                        - Prior to the procedure, a History and Physical was                         performed, and patient medications and allergies were                         reviewed. The patient's tolerance of previous                         anesthesia was also reviewed. The risks and benefits                         of the procedure and the sedation options and risks                         were discussed with the patient. All questions were                         answered, and informed consent was obtained. Prior                         Anticoagulants: The patient has taken no previous                         anticoagulant or antiplatelet agents. ASA Grade                         Assessment: II - A patient with mild systemic disease.                         After reviewing the risks and benefits, the patient                         was deemed in satisfactory condition to undergo the                         procedure.                        After obtaining informed consent, the scope was passed  under direct vision. Throughout the procedure, the                         patient's blood pressure, pulse, and oxygen                         saturations were monitored continuously. The [employer] D single use duodenoscope was                          introduced through the mouth, and used to inject                         contrast into and used to inject contrast into the                         bile duct. Findings:      A scout film of the abdomen was obtained. Surgical clips, consistent       with a previous cholecystectomy, were seen in the area of the right       upper quadrant of the abdomen. The major papilla was located entirely       within a diverticulum. The bile duct was deeply cannulated with the       short-nosed traction sphincterotome. Contrast was injected. I personally       interpreted the bile duct images. There was brisk flow of contrast       through the ducts. Image quality was excellent. Contrast extended to the       entire biliary tree. The lower third of the main bile duct contained       filling defect(s). A wire was passed into the biliary tree. A 5 mm       biliary sphincterotomy was made with a traction (standard)       sphincterotome using ERBE electrocautery. There was no       post-sphincterotomy bleeding. The biliary tree was swept with a 15 mm       balloon starting at the bifurcation. Sludge was swept from the duct. All       stones were removed. Impression:            - The major papilla was located entirely within a                         diverticulum.                        - A filling defect was seen on the cholangiogram.                        - Choledocholithiasis was found. Complete removal was                         accomplished by biliary sphincterotomy and balloon                         extraction.                        -  A biliary sphincterotomy was performed.                        - The biliary tree was swept. Recommendation:        - Discharge patient to home.                        - Resume previous diet.                        - Continue present medications.                        - Watch for pancreatitis, bleeding, perforation, and                          cholangitis. Procedure Code(s):     --- Professional ---                        [phone], Endoscopic retrograde cholangiopancreatography                         (ERCP); with removal of calculi/debris from                         biliary/pancreatic duct(s)                        [id number], Endoscopic retrograde cholangiopancreatography                         (ERCP); with sphincterotomy/papillotomy                        [phone], Endoscopic catheterization of the biliary                         ductal system, radiological supervision and                         interpretation Diagnosis Code(s):     --- Professional ---                        K80.50, Calculus of bile duct without cholangitis or                         cholecystitis without obstruction                        R93.2, Abnormal findings on diagnostic imaging of                         liver and biliary tract CPT copyright 2019 American Medical Association. All rights reserved. The codes documented in this report are preliminary and upon coder review may  be revised to meet current compliance requirements. Lucilla Lame MD, MD 08/19/2020 12:50:03 PM This report has been signed electronically. Number of Addenda: 0 Note Initiated On: 08/19/2020 11:50 AM Estimated Blood Loss:  Estimated blood loss: none.      [facility]

## 2020-08-19 NOTE — Anesthesia Procedure Notes (Signed)
Procedure Name: Intubation Date/Time: 08/19/2020 12:15 PM Performed by: Justus Memory, CRNA Pre-anesthesia Checklist: Patient identified, Patient being monitored, Timeout performed, Emergency Drugs available and Suction available Patient Re-evaluated:Patient Re-evaluated prior to induction Oxygen Delivery Method: Circle system utilized Preoxygenation: Pre-oxygenation with 100% oxygen Induction Type: IV induction Ventilation: Mask ventilation without difficulty Laryngoscope Size: 3 and McGraph Grade View: Grade I Tube type: Oral Tube size: 7.0 mm Number of attempts: 1 Airway Equipment and Method: Stylet and Video-laryngoscopy Placement Confirmation: ETT inserted through vocal cords under direct vision,  positive ETCO2 and breath sounds checked- equal and bilateral Secured at: 20 cm Tube secured with: Tape Dental Injury: Teeth and Oropharynx as per pre-operative assessment  Difficulty Due To: Difficulty was anticipated, Difficult Airway- due to anterior larynx, Difficult Airway- due to large tongue and Difficult Airway- due to reduced neck mobility Future Recommendations: Recommend- induction with short-acting agent, and alternative techniques readily available

## 2020-08-19 NOTE — Anesthesia Preprocedure Evaluation (Signed)
Anesthesia Evaluation  Patient identified by MRN, date of birth, ID band Patient awake    Reviewed: Allergy & Precautions, H&P , NPO status , Patient's Chart, lab work & pertinent test results, reviewed documented beta blocker date and time   Airway Mallampati: III  TM Distance: >3 FB Neck ROM: full    Dental  (+) Teeth Intact   Pulmonary shortness of breath and with exertion, sleep apnea and Continuous Positive Airway Pressure Ventilation ,    Pulmonary exam normal        Cardiovascular Exercise Tolerance: Poor hypertension, On Medications + angina with exertion Normal cardiovascular exam+ dysrhythmias  Rhythm:regular Rate:Normal     Neuro/Psych  Headaches, PSYCHIATRIC DISORDERS Anxiety Depression TIA Neuromuscular disease CVA    GI/Hepatic Neg liver ROS, GERD  Medicated,  Endo/Other  diabetes, Poorly ControlledHypothyroidism   Renal/GU negative Renal ROS  negative genitourinary   Musculoskeletal   Abdominal   Peds  Hematology  (+) Blood dyscrasia, anemia ,   Anesthesia Other Findings Past Medical History: No date: Abdominal pain No date: Anxiety and depression No date: Atypical facial pain No date: Bilateral occipital neuralgia No date: Cervical spondylosis without myelopathy No date: Chronic daily headache No date: Chronic pain No date: Chronic pelvic pain in female No date: Chronic thoracic back pain No date: Depression No date: Diabetes mellitus without complication ([facility]) No date: Diabetes mellitus, type II ([facility]) No date: Dysphagia No date: Dysrhythmia No date: Fibromyalgia No date: Hyperlipidemia No date: Hypertension No date: Insomnia No date: Iron deficiency anemia No date: Left hip pain No date: Low back pain No date: Migraines No date: Numbness No date: Optic neuropathy No date: OSA (obstructive sleep apnea) No date: Polyarthralgia No date: Primary osteoarthritis of both hips No date:  Prothrombin gene mutation ([facility]) No date: Pseudotumor cerebri No date: Rectal bleeding No date: Right shoulder pain No date: Rotator cuff syndrome No date: Stroke [facility]) No date: Thyroid disease 05/27/2017: TIA (transient ischemic attack) Past Surgical History: No date: ABDOMINAL HYSTERECTOMY     Comment:  total No date: CHOLECYSTECTOMY 03/21/2018: COLONOSCOPY WITH PROPOFOL; N/A     Comment:  Procedure: COLONOSCOPY WITH PROPOFOL;  Surgeon: Lucilla Lame, MD;  Location: [facility] ENDOSCOPY;  Service:               Endoscopy;  Laterality: N/A; 03/21/2018: ESOPHAGOGASTRODUODENOSCOPY (EGD) WITH PROPOFOL; N/A     Comment:  Procedure: ESOPHAGOGASTRODUODENOSCOPY (EGD) WITH               PROPOFOL;  Surgeon: Lucilla Lame, MD;  Location: [facility]               ENDOSCOPY;  Service: Endoscopy;  Laterality: N/A; 09/24/2019: GIVENS CAPSULE STUDY; N/A     Comment:  Procedure: GIVENS CAPSULE STUDY;  Surgeon: Virgel Manifold, MD;  Location: [facility] ENDOSCOPY;  Service:               Endoscopy;  Laterality: N/A; No date: JOINT REPLACEMENT No date: KNEE SURGERY; Left No date: ROTATOR CUFF REPAIR; Right No date: spinal neurostimulator   Reproductive/Obstetrics negative OB ROS                             Anesthesia Physical Anesthesia Plan  ASA: III  Anesthesia Plan: General ETT   Post-op Pain  Management:    Induction:   PONV Risk Score and Plan:   Airway Management Planned:   Additional Equipment:   Intra-op Plan:   Post-operative Plan:   Informed Consent: I have reviewed the patients History and Physical, chart, labs and discussed the procedure including the risks, benefits and alternatives for the proposed anesthesia with the patient or authorized representative who has indicated his/her understanding and acceptance.     Dental Advisory Given  Plan Discussed with: CRNA  Anesthesia Plan Comments:         Anesthesia Quick  Evaluation

## 2020-08-20 ENCOUNTER — Encounter: Payer: Self-pay | Admitting: Gastroenterology

## 2020-08-20 NOTE — Anesthesia Postprocedure Evaluation (Signed)
Anesthesia Post Note  Patient: Christine Mcneil  Procedure(s) Performed: ENDOSCOPIC RETROGRADE CHOLANGIOPANCREATOGRAPHY (ERCP) (N/A )  Patient location during evaluation: PACU Anesthesia Type: General Level of consciousness: awake and alert Pain management: pain level controlled Vital Signs Assessment: post-procedure vital signs reviewed and stable Respiratory status: spontaneous breathing, nonlabored ventilation, respiratory function stable and patient connected to nasal cannula oxygen Cardiovascular status: blood pressure returned to baseline and stable Postop Assessment: no apparent nausea or vomiting Anesthetic complications: no   No complications documented.   Last Vitals:  Vitals:   08/19/20 1315 08/19/20 1330  BP: 112/65 116/69  Pulse: 73 69  Resp: 13 13  Temp: 36.8 C   SpO2: 97% 94%    Last Pain:  Vitals:   08/20/20 0807  TempSrc:   PainSc: 0-No pain                 Molli Barrows

## 2020-08-29 ENCOUNTER — Telehealth (INDEPENDENT_AMBULATORY_CARE_PROVIDER_SITE_OTHER): Payer: Medicare Other | Admitting: Psychiatry

## 2020-08-29 ENCOUNTER — Other Ambulatory Visit: Payer: Self-pay

## 2020-08-29 ENCOUNTER — Encounter: Payer: Self-pay | Admitting: Psychiatry

## 2020-08-29 DIAGNOSIS — G4701 Insomnia due to medical condition: Secondary | ICD-10-CM

## 2020-08-29 DIAGNOSIS — F4310 Post-traumatic stress disorder, unspecified: Secondary | ICD-10-CM | POA: Diagnosis not present

## 2020-08-29 DIAGNOSIS — F3342 Major depressive disorder, recurrent, in full remission: Secondary | ICD-10-CM | POA: Diagnosis not present

## 2020-08-29 DIAGNOSIS — F411 Generalized anxiety disorder: Secondary | ICD-10-CM | POA: Diagnosis not present

## 2020-08-29 MED ORDER — DULOXETINE HCL 30 MG PO CPEP: 30.0000 mg | ORAL_CAPSULE | Freq: Every day | ORAL | 0 refills | Status: DC

## 2020-08-29 MED ORDER — DULOXETINE HCL 60 MG PO CPEP: 60.0000 mg | ORAL_CAPSULE | Freq: Every day | ORAL | 0 refills | Status: DC

## 2020-08-29 MED ORDER — GABAPENTIN 600 MG PO TABS: 600.0000 mg | ORAL_TABLET | Freq: Two times a day (BID) | ORAL | 0 refills | Status: DC

## 2020-08-29 NOTE — Patient Instructions (Signed)
Please increase gabapentin to 600 mg p.o. twice daily Please stop gabapentin 100 mg at bedtime. Discontinue lamotrigine. Continue Cymbalta 90 mg p.o. daily Continue Abilify 2.5 mg p.o. nightly Continue trazodone 150-200 mg at bedtime

## 2020-08-29 NOTE — Progress Notes (Signed)
Virtual Visit via Video Note  I connected with Christine Mcneil on 08/29/20 at 10:00 AM EDT by a video enabled telemedicine application and verified that I am speaking with the correct person using two identifiers.  Location Provider Location : [facility] Patient Location : Home  Participants: Patient , Provider   I discussed the limitations of evaluation and management by telemedicine and the availability of in person appointments. The patient expressed understanding and agreed to proceed.   I discussed the assessment and treatment plan with the patient. The patient was provided an opportunity to ask questions and all were answered. The patient agreed with the plan and demonstrated an understanding of the instructions.   The patient was advised to call back or seek an in-person evaluation if the symptoms worsen or if the condition fails to improve as anticipated.    [facility] MD OP Progress Note  08/29/2020 10:43 AM Christine Mcneil  MRN:  [id number]  Chief Complaint:  Chief Complaint    Follow-up; Depression     HPI: Christine Mcneil is a 55 year old Caucasian female, divorced, lives in [place], has a history of PTSD, MDD, GAD, insomnia, CVA, OSA, history of prothrombin gene mutation, chronic pain, migraine headaches, iron deficiency anemia was evaluated by telemedicine today.  Patient today reports she is currently struggling with a lot of back pain.  She reports this has been going on since the past 6 months or so.  She reports she is currently following up with her providers and she was started on gabapentin 100 mg at night.  She reports she also has been taking a 600 mg prescribed by Probation officer during the day.  She reports she has been noncompliant with the 600 mg twice a day since she did not know that was her correct dosage.  She also reports she has been taking the Lamictal as prescribed and never stopped taking it as discussed several months ago.  She reports she does not recollect being asked to stop it.   She however reports she is about to run out of that prescription and is willing to stop it.  Patient reports the back pain does have an impact on her sleep.  She  has been taking naps during the day.  She otherwise denies any significant depression, mood swings.  Patient denies any suicidality, homicidality or perceptual disturbances.  Patient denies any other concerns today.    Visit Diagnosis:    ICD-10-CM   1. MDD (major depressive disorder), recurrent, in full remission ([place])  F33.42   2. PTSD (post-traumatic stress disorder)  F43.10 gabapentin (NEURONTIN) 600 MG tablet    DULoxetine (CYMBALTA) 60 MG capsule    DULoxetine (CYMBALTA) 30 MG capsule  3. GAD (generalized anxiety disorder)  F41.1 gabapentin (NEURONTIN) 600 MG tablet  4. Insomnia due to medical condition  G47.01    neuropathy,back pain    Past Psychiatric History: I have reviewed past psychiatric history from my progress note on 08/16/2017.  Past trials of venlafaxine, Klonopin  Past Medical History:  Past Medical History:  Diagnosis Date  . Abdominal pain   . Anxiety and depression   . Atypical facial pain   . Bilateral occipital neuralgia   . Cervical spondylosis without myelopathy   . Chronic daily headache   . Chronic pain   . Chronic pelvic pain in female   . Chronic thoracic back pain   . Depression   . Diabetes mellitus without complication ([place])   . Diabetes mellitus, type II (  [place])   . Dysphagia   . Dysrhythmia   . Fibromyalgia   . Hyperlipidemia   . Hypertension   . Hypothyroidism   . Insomnia   . Iron deficiency anemia   . Left hip pain   . Low back pain   . Migraines   . Numbness   . Optic neuropathy   . OSA (obstructive sleep apnea)   . Polyarthralgia   . Primary osteoarthritis of both hips   . Prothrombin gene mutation ([place])   . Pseudotumor cerebri   . Rectal bleeding   . Right shoulder pain   . Rotator cuff syndrome   . Stroke ([place])   . Thyroid disease   . TIA (transient  ischemic attack) 05/27/2017    Past Surgical History:  Procedure Laterality Date  . ABDOMINAL HYSTERECTOMY     total  . CHOLECYSTECTOMY    . COLONOSCOPY WITH PROPOFOL N/A 03/21/2018   Procedure: COLONOSCOPY WITH PROPOFOL;  Surgeon: Lucilla Lame, MD;  Location: [facility] ENDOSCOPY;  Service: Endoscopy;  Laterality: N/A;  . ERCP N/A 08/19/2020   Procedure: ENDOSCOPIC RETROGRADE CHOLANGIOPANCREATOGRAPHY (ERCP);  Surgeon: Lucilla Lame, MD;  Location: [facility] ENDOSCOPY;  Service: Endoscopy;  Laterality: N/A;  . ESOPHAGOGASTRODUODENOSCOPY (EGD) WITH PROPOFOL N/A 03/21/2018   Procedure: ESOPHAGOGASTRODUODENOSCOPY (EGD) WITH PROPOFOL;  Surgeon: Lucilla Lame, MD;  Location: [facility] ENDOSCOPY;  Service: Endoscopy;  Laterality: N/A;  . GIVENS CAPSULE STUDY N/A 09/24/2019   Procedure: GIVENS CAPSULE STUDY;  Surgeon: Virgel Manifold, MD;  Location: [facility] ENDOSCOPY;  Service: Endoscopy;  Laterality: N/A;  . JOINT REPLACEMENT    . KNEE SURGERY Left   . ROTATOR CUFF REPAIR Right   . spinal neurostimulator      Family Psychiatric History: I have reviewed family psychiatric history from my progress note on 08/16/2017  Family History:  Family History  Problem Relation Age of Onset  . Diabetes Mother   . Hyperlipidemia Mother   . Hypertension Mother   . COPD Mother   . CVA Mother   . Anemia Mother   . Diabetes Father   . Hyperlipidemia Father   . Hypertension Father   . Thyroid disease Father   . Alcohol abuse Father   . Peripheral vascular disease Sister   . Hypertension Sister   . Anxiety disorder Son   . Depression Son   . Bipolar disorder Son   . Seizures Son     Social History: Reviewed social history from my progress note on 08/16/2017 Social History   Socioeconomic History  . Marital status: Divorced    Spouse name: Not on file  . Number of children: 1  . Years of education: Not on file  . Highest education level: High school graduate  Occupational History    Comment: disablitiy   Tobacco Use  . Smoking status: Never Smoker  . Smokeless tobacco: Never Used  Vaping Use  . Vaping Use: Never used  Substance and Sexual Activity  . Alcohol use: No  . Drug use: No  . Sexual activity: Not Currently  Other Topics Concern  . Not on file  Social History Narrative  . Not on file   Social Determinants of Health   Financial Resource Strain: Not on file  Food Insecurity: Not on file  Transportation Needs: Not on file  Physical Activity: Not on file  Stress: Not on file  Social Connections: Not on file    Allergies:  Allergies  Allergen Reactions  . Amoxapine Itching  . Amoxicillin Itching  .  Dapagliflozin Itching    Other reaction(s): Other (See Comments) Thrush & vaginal itching  . Keflex [Cephalexin] Itching  . Mirtazapine Other (See Comments)    Pt states felling "like my body is outside of me."    Metabolic Disorder Labs: Lab Results  Component Value Date   HGBA1C 9.9 (H) 06/21/2017   MPG 237.43 06/21/2017   Lab Results  Component Value Date   PROLACTIN 5.5 09/06/2017   Lab Results  Component Value Date   CHOL 277 (H) 06/21/2017   TRIG 196 (H) 06/21/2017   HDL 64 06/21/2017   CHOLHDL 4.3 06/21/2017   VLDL 39 06/21/2017   LDLCALC 174 (H) 06/21/2017   Lab Results  Component Value Date   TSH 2.890 09/06/2017   TSH 3.176 01/04/2017    Therapeutic Level Labs: No results found for: LITHIUM No results found for: VALPROATE No components found for:  CBMZ  Current Medications: Current Outpatient Medications  Medication Sig Dispense Refill  . ACCU-CHEK AVIVA PLUS test strip     . ACCU-CHEK SOFTCLIX LANCETS lancets     . acetaZOLAMIDE (DIAMOX) 250 MG tablet     . AIMOVIG 70 MG/ML SOAJ Inject 70 mg into the skin every 28 (twenty-eight) days.    Marland Kitchen albuterol (VENTOLIN HFA) 108 (90 Base) MCG/ACT inhaler Inhale 2 puffs into the lungs every 6 (six) hours as needed for wheezing or shortness of breath. 1 g 0  . ARIPiprazole (ABILIFY) 5 MG tablet  Take 0.5 tablets (2.5 mg total) by mouth daily. 90 tablet 1  . aspirin-acetaminophen-caffeine (EXCEDRIN MIGRAINE) 250-250-65 MG tablet Take 2 tablets by mouth every 8 (eight) hours as needed for headache or migraine.     Marland Kitchen atorvastatin (LIPITOR) 80 MG tablet Take 80 mg by mouth daily at 6 PM.    . Blood Glucose Monitoring Suppl (ACCU-CHEK AVIVA PLUS) w/Device KIT     . Carboxymethylcellulose Sod PF 0.5 % SOLN Apply 1 drop to eye daily as needed.    . Cholecalciferol (VITAMIN D3) 1000 units CAPS Take 1 capsule by mouth daily.    . clotrimazole (LOTRIMIN) 1 % cream Apply 1 application topically daily as needed.    . cyclobenzaprine (FLEXERIL) 5 MG tablet Take 5 mg by mouth 3 (three) times daily as needed.     . dabigatran (PRADAXA) 150 MG CAPS capsule Take 150 mg by mouth 2 (two) times daily.    Marland Kitchen diltiazem (CARDIZEM CD) 240 MG 24 hr capsule Take by mouth. Take 1 capsule by mouth once daily    . esomeprazole (NEXIUM) 40 MG capsule Take 40 mg by mouth daily.    . famotidine (PEPCID) 40 MG tablet Take 40 mg by mouth 2 (two) times daily.    . ferrous sulfate 325 (65 FE) MG tablet Take by mouth. Take 325 mg daily with breakfast    . fluticasone (FLONASE) 50 MCG/ACT nasal spray Place 2 sprays into both nostrils as needed.     . hydrOXYzine (VISTARIL) 25 MG capsule TAKE (1) CAPSULE BY MOUTH TWICE DAILY ASNEEDED FOR SEVERE ANXIETY ATTACKS. 180 capsule 0  . Insulin Degludec (TRESIBA FLEXTOUCH) 200 UNIT/ML SOPN Inject 200 Units into the skin daily.    . insulin lispro (HUMALOG) 100 UNIT/ML KwikPen Inject up to 50 units daily in divided doses, as directed    . Insulin Pen Needle (BD ULTRA-FINE PEN NEEDLES) 29G X 12.7MM MISC Use 3 times daily as directed    . levothyroxine (SYNTHROID, LEVOTHROID) 100 MCG tablet Take 100 mcg by  mouth daily.    Marland Kitchen losartan (COZAAR) 25 MG tablet Take 12.5 mg by mouth daily.    . Magnesium 250 MG TABS Take 1 tablet by mouth daily.    . Melatonin 10 MG TABS Take 2 tablets by mouth  at bedtime.    . meloxicam (MOBIC) 15 MG tablet Take 15 mg by mouth daily.    . metFORMIN (GLUCOPHAGE-XR) 500 MG 24 hr tablet Take 500 mg by mouth 2 (two) times daily.     . metoprolol succinate (TOPROL-XL) 25 MG 24 hr tablet Take 1 tablet by mouth daily.    . naloxone (NARCAN) nasal spray 4 mg/0.1 mL 1 spray into nose for opioid overdose. If needed, may repeat spray in 2-3 min    . NUCYNTA ER 100 MG 12 hr tablet Take 1 tablet by mouth 2 (two) times daily.    Marland Kitchen nystatin (MYCOSTATIN) 100000 UNIT/ML suspension Take 5 mLs by mouth 4 (four) times daily.    Marland Kitchen omeprazole (PRILOSEC) 20 MG capsule TAKE (1) CAPSULE BY MOUTH EVERY DAY 30 capsule 0  . ondansetron (ZOFRAN) 4 MG tablet Take 4 mg by mouth at bedtime.    Marland Kitchen oxyCODONE (OXY IR/ROXICODONE) 5 MG immediate release tablet Take 5 mg by mouth 2 (two) times daily as needed.    . pioglitazone (ACTOS) 15 MG tablet Take by mouth. Take 1 tablet by mouth once daily    . quinapril (ACCUPRIL) 10 MG tablet Take 10 mg by mouth daily.    . Riboflavin 400 MG TABS Take 1 tablet by mouth every night  for headaches    . Rimegepant Sulfate (NURTEC) 75 MG TBDP Take 1 tablet by mouth as needed.     . traZODone (DESYREL) 100 MG tablet Take 1.5-2 tablets (150-200 mg total) by mouth at bedtime as needed for sleep. 180 tablet 0  . TRESIBA FLEXTOUCH 200 UNIT/ML SOPN Inject 76 Units into the skin at bedtime.    . DULoxetine (CYMBALTA) 30 MG capsule Take 1 capsule (30 mg total) by mouth daily. To be combined with 60 mg 90 capsule 0  . DULoxetine (CYMBALTA) 60 MG capsule Take 1 capsule (60 mg total) by mouth daily. To be combined with 30 mg 90 capsule 0  . furosemide (LASIX) 20 MG tablet Take 20 mg by mouth daily.    Marland Kitchen gabapentin (NEURONTIN) 600 MG tablet Take 1 tablet (600 mg total) by mouth 2 (two) times daily. 180 tablet 0   No current facility-administered medications for this visit.     Musculoskeletal: Strength & Muscle Tone: UTA Gait & Station: UTA Patient leans:  N/A  Psychiatric Specialty Exam: Review of Systems  Musculoskeletal: Positive for back pain.  Psychiatric/Behavioral: Positive for sleep disturbance. The patient is nervous/anxious.   All other systems reviewed and are negative.   There were no vitals taken for this visit.There is no height or weight on file to calculate BMI.  General Appearance: Casual  Eye Contact:  Fair  Speech:  Clear and Coherent  Volume:  Normal  Mood:  Anxious improving  Affect:  Congruent  Thought Process:  Goal Directed and Descriptions of Associations: Intact  Orientation:  Full (Time, Place, and Person)  Thought Content: Logical   Suicidal Thoughts:  No  Homicidal Thoughts:  No  Memory:  Immediate;   Fair Recent;   Fair Remote;   Fair  Judgement:  Fair  Insight:  Fair  Psychomotor Activity:  Normal  Concentration:  Concentration: Fair and Attention Span: Fair  Recall:  Smiley [place]man of Knowledge: Fair  Language: Fair  Akathisia:  No  Handed:  Right  AIMS (if indicated): UTA  Assets:  Communication Skills Desire for Improvement Housing Resilience Social Support  ADL's:  Intact  Cognition: WNL  Sleep:  Poor due to pain   Screenings: PHQ2-9   Flowsheet Row Video Visit from 08/04/2020 in [facility] Video Visit from 07/07/2020 in [facility]  PHQ-2 Total Score 1 1    Flowsheet Row Admission (Discharged) from 08/19/2020 in [facility] Video Visit from 08/04/2020 in [facility] Video Visit from 07/07/2020 in [facility] No Risk No Risk No Risk       Assessment and Plan: Christine Mcneil is a 55 year old Caucasian female who has a history of MDD, PTSD, migraine headaches, history of CVA, iron deficiency, prothrombin gene mutation was evaluated by telemedicine today.  Patient is currently struggling with back pain which does have an impact  on her sleep.  Plan as noted below.  Plan MDD in full remission Cymbalta 90 mg p.o. daily Abilify at reduced dose of 2.5 mg p.o. daily.  Long-term plan is to taper it off.  GAD-improving Patient has been taking gabapentin 600 mg p.o. daily and gabapentin 100 mg p.o. nightly , the 100 mg at bedtime dose was recently started by pain management provider. Advised patient to stop the gabapentin 100 mg p.o. nightly since she was supposed to take gabapentin 600 mg p.o. twice daily.  She has been noncompliant.  Provided education, encouraged compliance. Also discussed to stop Lamictal which was advised to stop several months ago and she has not followed through with recommendations. Cymbalta 90 mg p.o. daily Hydroxyzine 25 mg p.o. daily as needed for anxiety attacks  PTSD-stable Continue CBT as needed  Insomnia-unstable Trazodone 150 to 200 mg p.o. nightly Melatonin as prescribed Patient to take a higher dosage of gabapentin at night, 600 mg which will help with her sleep and pain.  Her sleep problems are likely due to her back pain.  She will benefit from sufficient pain management.  She will continue to follow-up with her providers for pain management.  Provided patient with clear instructions about medication changes.    Follow-up in clinic in 1 month in office.  This note was generated in part or whole with voice recognition software. Voice recognition is usually quite accurate but there are transcription errors that can and very often do occur. I apologize for any typographical errors that were not detected and corrected.      Ursula Alert, MD 08/29/2020, 10:43 AM

## 2020-09-08 ENCOUNTER — Ambulatory Visit: Payer: Medicare Other | Admitting: Gastroenterology

## 2020-09-16 ENCOUNTER — Other Ambulatory Visit: Payer: Medicare Other

## 2020-09-16 ENCOUNTER — Other Ambulatory Visit: Payer: Self-pay

## 2020-09-16 DIAGNOSIS — D509 Iron deficiency anemia, unspecified: Secondary | ICD-10-CM

## 2020-09-25 ENCOUNTER — Other Ambulatory Visit: Payer: Self-pay

## 2020-09-25 ENCOUNTER — Encounter: Payer: Self-pay | Admitting: Gastroenterology

## 2020-09-25 ENCOUNTER — Ambulatory Visit (INDEPENDENT_AMBULATORY_CARE_PROVIDER_SITE_OTHER): Payer: Medicare Other | Admitting: Gastroenterology

## 2020-09-25 VITALS — BP 131/86 | HR 83 | Temp 98.1°F | Ht 66.0 in | Wt 258.0 lb

## 2020-09-25 DIAGNOSIS — D509 Iron deficiency anemia, unspecified: Secondary | ICD-10-CM

## 2020-09-25 DIAGNOSIS — R748 Abnormal levels of other serum enzymes: Secondary | ICD-10-CM | POA: Insufficient documentation

## 2020-09-26 LAB — CBC
Hematocrit: 42.2 % (ref 34.0–46.6)
Hemoglobin: 13.7 g/dL (ref 11.1–15.9)
MCH: 28.6 pg (ref 26.6–33.0)
MCHC: 32.5 g/dL (ref 31.5–35.7)
MCV: 88 fL (ref 79–97)
Platelets: 191 10*3/uL (ref 150–450)
RBC: 4.79 x10E6/uL (ref 3.77–5.28)
RDW: 13.7 % (ref 11.7–15.4)
WBC: 5.8 10*3/uL (ref 3.4–10.8)

## 2020-09-26 LAB — HEPATIC FUNCTION PANEL
ALT: 24 IU/L (ref 0–32)
AST: 19 IU/L (ref 0–40)
Albumin: 4.5 g/dL (ref 3.8–4.9)
Alkaline Phosphatase: 109 IU/L (ref 44–121)
Bilirubin Total: 0.3 mg/dL (ref 0.0–1.2)
Bilirubin, Direct: <0.1 mg/dL (ref 0.00–0.40)
Total Protein: 6.8 g/dL (ref 6.0–8.5)

## 2020-09-26 LAB — FERRITIN: Ferritin: 75 ng/mL (ref 15–150)

## 2020-09-26 NOTE — Progress Notes (Signed)
[name], MD [address]  [place]  [place],  [address]  Main: [phone]  Fax: [phone]   Primary Care Physician: Doughton, Latricia Heft, MD   Chief Complaint  Patient presents with  . Abdominal Pain    HPI: CARIA TRANSUE is a 55 y.o. female here for follow-up of abdominal pain.  Patient was found to have choledocholithiasis and underwent ERCP with biliary sphincterotomy.  Doing well postprocedure with no further abdominal pain.  Also has a history of iron deficiency anemia with a previously inconclusive small bowel capsule study due to a stomach full of food.  Follows with hematology.  States missed her last appointment for repeat blood work after previous IV iron infusion.  Current Outpatient Medications  Medication Sig Dispense Refill  . ACCU-CHEK AVIVA PLUS test strip     . ACCU-CHEK SOFTCLIX LANCETS lancets     . acetaZOLAMIDE (DIAMOX) 250 MG tablet     . AIMOVIG 70 MG/ML SOAJ Inject 70 mg into the skin every 28 (twenty-eight) days.    Marland Kitchen albuterol (VENTOLIN HFA) 108 (90 Base) MCG/ACT inhaler Inhale 2 puffs into the lungs every 6 (six) hours as needed for wheezing or shortness of breath. 1 g 0  . ARIPiprazole (ABILIFY) 5 MG tablet Take 0.5 tablets (2.5 mg total) by mouth daily. 90 tablet 1  . aspirin-acetaminophen-caffeine (EXCEDRIN MIGRAINE) 250-250-65 MG tablet Take 2 tablets by mouth every 8 (eight) hours as needed for headache or migraine.     Marland Kitchen atorvastatin (LIPITOR) 80 MG tablet Take 80 mg by mouth daily at 6 PM.    . Blood Glucose Monitoring Suppl (ACCU-CHEK AVIVA PLUS) w/Device KIT     . Carboxymethylcellulose Sod PF 0.5 % SOLN Apply 1 drop to eye daily as needed.    . Cholecalciferol (VITAMIN D3) 1000 units CAPS Take 1 capsule by mouth daily.    . clotrimazole (LOTRIMIN) 1 % cream Apply 1 application topically daily as needed.    . cyclobenzaprine (FLEXERIL) 5 MG tablet Take 5 mg by mouth 3 (three) times daily as needed.     . dabigatran  (PRADAXA) 150 MG CAPS capsule Take 150 mg by mouth 2 (two) times daily.    Marland Kitchen diltiazem (CARDIZEM CD) 240 MG 24 hr capsule Take by mouth. Take 1 capsule by mouth once daily    . DULoxetine (CYMBALTA) 30 MG capsule Take 1 capsule (30 mg total) by mouth daily. To be combined with 60 mg 90 capsule 0  . DULoxetine (CYMBALTA) 60 MG capsule Take 1 capsule (60 mg total) by mouth daily. To be combined with 30 mg 90 capsule 0  . esomeprazole (NEXIUM) 40 MG capsule Take 40 mg by mouth daily.    . famotidine (PEPCID) 40 MG tablet Take 40 mg by mouth 2 (two) times daily.    . ferrous sulfate 325 (65 FE) MG tablet Take by mouth. Take 325 mg daily with breakfast    . fluticasone (FLONASE) 50 MCG/ACT nasal spray Place 2 sprays into both nostrils as needed.     . furosemide (LASIX) 20 MG tablet Take 20 mg by mouth daily.    Marland Kitchen gabapentin (NEURONTIN) 600 MG tablet Take 1 tablet (600 mg total) by mouth 2 (two) times daily. 180 tablet 0  . hydrOXYzine (VISTARIL) 25 MG capsule TAKE (1) CAPSULE BY MOUTH TWICE DAILY ASNEEDED FOR SEVERE ANXIETY ATTACKS. 180 capsule 0  . Insulin Degludec (TRESIBA FLEXTOUCH) 200 UNIT/ML SOPN Inject 200 Units into the skin daily.    Marland Kitchen  insulin lispro (HUMALOG) 100 UNIT/ML KwikPen Inject up to 50 units daily in divided doses, as directed    . Insulin Pen Needle (BD ULTRA-FINE PEN NEEDLES) 29G X 12.7MM MISC Use 3 times daily as directed    . levothyroxine (SYNTHROID, LEVOTHROID) 100 MCG tablet Take 100 mcg by mouth daily.    Marland Kitchen losartan (COZAAR) 25 MG tablet Take 12.5 mg by mouth daily.    . Magnesium 250 MG TABS Take 1 tablet by mouth daily.    . Melatonin 10 MG TABS Take 2 tablets by mouth at bedtime.    . meloxicam (MOBIC) 15 MG tablet Take 15 mg by mouth daily.    . metFORMIN (GLUCOPHAGE-XR) 500 MG 24 hr tablet Take 500 mg by mouth 2 (two) times daily.     . metoprolol succinate (TOPROL-XL) 25 MG 24 hr tablet Take 1 tablet by mouth daily.    . naloxone (NARCAN) nasal spray 4 mg/0.1 mL 1  spray into nose for opioid overdose. If needed, may repeat spray in 2-3 min    . NUCYNTA ER 100 MG 12 hr tablet Take 1 tablet by mouth 2 (two) times daily.    Marland Kitchen nystatin (MYCOSTATIN) 100000 UNIT/ML suspension Take 5 mLs by mouth 4 (four) times daily.    Marland Kitchen omeprazole (PRILOSEC) 20 MG capsule TAKE (1) CAPSULE BY MOUTH EVERY DAY 30 capsule 0  . ondansetron (ZOFRAN) 4 MG tablet Take 4 mg by mouth at bedtime.    Marland Kitchen oxyCODONE (OXY IR/ROXICODONE) 5 MG immediate release tablet Take 5 mg by mouth 2 (two) times daily as needed.    . pioglitazone (ACTOS) 15 MG tablet Take by mouth. Take 1 tablet by mouth once daily    . quinapril (ACCUPRIL) 10 MG tablet Take 10 mg by mouth daily.    . Riboflavin 400 MG TABS Take 1 tablet by mouth every night  for headaches    . Rimegepant Sulfate (NURTEC) 75 MG TBDP Take 1 tablet by mouth as needed.     . traZODone (DESYREL) 100 MG tablet Take 1.5-2 tablets (150-200 mg total) by mouth at bedtime as needed for sleep. 180 tablet 0  . TRESIBA FLEXTOUCH 200 UNIT/ML SOPN Inject 76 Units into the skin at bedtime.     No current facility-administered medications for this visit.    Allergies as of 09/25/2020 - Review Complete 08/29/2020  Allergen Reaction Noted  . Amoxapine Itching 09/16/2015  . Amoxicillin Itching   . Dapagliflozin Itching 11/07/2017  . Keflex [cephalexin] Itching 09/16/2015  . Mirtazapine Other (See Comments) 06/08/2019    ROS:  General: Negative for anorexia, weight loss, fever, chills, fatigue, weakness. ENT: Negative for hoarseness, difficulty swallowing , nasal congestion. CV: Negative for chest pain, angina, palpitations, dyspnea on exertion, peripheral edema.  Respiratory: Negative for dyspnea at rest, dyspnea on exertion, cough, sputum, wheezing.  GI: See history of present illness. GU:  Negative for dysuria, hematuria, urinary incontinence, urinary frequency, nocturnal urination.  Endo: Negative for unusual weight change.    Physical  Examination:   BP 131/86   Pulse 83   Temp 98.1 F (36.7 C) (Oral)   Ht _0  (1.676 m)   Wt 258 lb (117 kg)   LMP  (LMP Unknown)   BMI 41.64 kg/m   General: Well-nourished, well-developed in no acute distress.  Eyes: No icterus. Conjunctivae pink. Mouth: Oropharyngeal mucosa moist and pink , no lesions erythema or exudate. Neck: Supple, Trachea midline Abdomen: Bowel sounds are normal, nontender, nondistended, no hepatosplenomegaly  or masses, no abdominal bruits or hernia , no rebound or guarding.   Extremities: No lower extremity edema. No clubbing or deformities. Neuro: Alert and oriented x 3.  Grossly intact. Skin: Warm and dry, no jaundice.   Psych: Alert and cooperative, normal mood and affect.   Labs: CMP     Component Value Date/Time   NA 137 04/14/2020 1717   NA 138 03/22/2014 1659   K 3.9 04/14/2020 1717   K 3.7 03/22/2014 1659   CL 101 04/14/2020 1717   CL 104 03/22/2014 1659   CO2 24 04/14/2020 1717   CO2 23 03/22/2014 1659   GLUCOSE 157 (H) 04/14/2020 1717   GLUCOSE 156 (H) 03/22/2014 1659   BUN 14 04/14/2020 1717   BUN 7 03/22/2014 1659   CREATININE 1.10 (H) 04/14/2020 1717   CREATININE 0.98 03/22/2014 1659   CALCIUM 9.2 04/14/2020 1717   CALCIUM 9.0 03/22/2014 1659   PROT 6.8 09/25/2020 1550   PROT 8.8 (H) 03/22/2014 1659   ALBUMIN 4.5 09/25/2020 1550   ALBUMIN 4.5 03/22/2014 1659   AST 19 09/25/2020 1550   AST 34 03/22/2014 1659   ALT 24 09/25/2020 1550   ALT 37 03/22/2014 1659   ALKPHOS 109 09/25/2020 1550   ALKPHOS 147 (H) 03/22/2014 1659   BILITOT 0.3 09/25/2020 1550   BILITOT 0.3 03/22/2014 1659   GFRNONAA 59 (L) 04/14/2020 1717   GFRNONAA >60 03/22/2014 1659   GFRNONAA >60 09/15/2013 0131   GFRAA >60 09/25/2019 1015   GFRAA >60 03/22/2014 1659   GFRAA >60 09/15/2013 0131   Lab Results  Component Value Date   WBC 5.8 09/25/2020   HGB 13.7 09/25/2020   HCT 42.2 09/25/2020   MCV 88 09/25/2020   PLT 191 09/25/2020    Imaging  Studies: No results found.  Assessment and Plan:   JESSINA MARSE is a 55 y.o. y/o female here for follow-up of abdominal pain, choledocholithiasis, iron deficiency anemia  Patient has completed ERCP with biliary sphincterotomy and stone extraction and doing well postprocedure.  Repeat liver enzymes are normal  CBC and ferritin are now normal on repeat blood work I ordered today  Since pt responding well to iron replacement will hold off on repeating capsule study since no signs of active bleeding.   Continue follow up with hematology    Dr [name]

## 2020-10-02 ENCOUNTER — Telehealth: Payer: Self-pay

## 2020-10-02 NOTE — Telephone Encounter (Signed)
Patient left a vmail. Says she's having pain on her right side. Believes it may be a gall stone issue. Wants an order for an ultrasound. Please call patient.

## 2020-10-07 ENCOUNTER — Other Ambulatory Visit: Payer: Self-pay

## 2020-10-07 ENCOUNTER — Telehealth: Payer: Self-pay | Admitting: Gastroenterology

## 2020-10-07 ENCOUNTER — Encounter: Payer: Self-pay | Admitting: *Deleted

## 2020-10-07 DIAGNOSIS — R110 Nausea: Secondary | ICD-10-CM | POA: Insufficient documentation

## 2020-10-07 DIAGNOSIS — Z79899 Other long term (current) drug therapy: Secondary | ICD-10-CM | POA: Insufficient documentation

## 2020-10-07 DIAGNOSIS — Z7984 Long term (current) use of oral hypoglycemic drugs: Secondary | ICD-10-CM | POA: Diagnosis not present

## 2020-10-07 DIAGNOSIS — I10 Essential (primary) hypertension: Secondary | ICD-10-CM | POA: Diagnosis not present

## 2020-10-07 DIAGNOSIS — Z794 Long term (current) use of insulin: Secondary | ICD-10-CM | POA: Diagnosis not present

## 2020-10-07 DIAGNOSIS — E039 Hypothyroidism, unspecified: Secondary | ICD-10-CM | POA: Diagnosis not present

## 2020-10-07 DIAGNOSIS — Z7982 Long term (current) use of aspirin: Secondary | ICD-10-CM | POA: Insufficient documentation

## 2020-10-07 DIAGNOSIS — R1011 Right upper quadrant pain: Secondary | ICD-10-CM | POA: Diagnosis present

## 2020-10-07 DIAGNOSIS — K219 Gastro-esophageal reflux disease without esophagitis: Secondary | ICD-10-CM | POA: Diagnosis not present

## 2020-10-07 DIAGNOSIS — E119 Type 2 diabetes mellitus without complications: Secondary | ICD-10-CM | POA: Insufficient documentation

## 2020-10-07 LAB — CBC
HCT: 41.6 % (ref 36.0–46.0)
Hemoglobin: 13.7 g/dL (ref 12.0–15.0)
MCH: 29.1 pg (ref 26.0–34.0)
MCHC: 32.9 g/dL (ref 30.0–36.0)
MCV: 88.3 fL (ref 80.0–100.0)
Platelets: 188 10*3/uL (ref 150–400)
RBC: 4.71 MIL/uL (ref 3.87–5.11)
RDW: 13.4 % (ref 11.5–15.5)
WBC: 5.9 10*3/uL (ref 4.0–10.5)
nRBC: 0 % (ref 0.0–0.2)

## 2020-10-07 LAB — COMPREHENSIVE METABOLIC PANEL
ALT: 32 U/L (ref 0–44)
AST: 25 U/L (ref 15–41)
Albumin: 4.2 g/dL (ref 3.5–5.0)
Alkaline Phosphatase: 99 U/L (ref 38–126)
Anion gap: 11 (ref 5–15)
BUN: 10 mg/dL (ref 6–20)
CO2: 24 mmol/L (ref 22–32)
Calcium: 9.2 mg/dL (ref 8.9–10.3)
Chloride: 101 mmol/L (ref 98–111)
Creatinine, Ser: 0.94 mg/dL (ref 0.44–1.00)
GFR, Estimated: >60 mL/min (ref >60)
Glucose, Bld: 192 mg/dL — ABNORMAL HIGH (ref 70–99)
Potassium: 3.9 mmol/L (ref 3.5–5.1)
Sodium: 136 mmol/L (ref 135–145)
Total Bilirubin: 0.6 mg/dL (ref 0.3–1.2)
Total Protein: 7.4 g/dL (ref 6.5–8.1)

## 2020-10-07 LAB — URINALYSIS, COMPLETE (UACMP) WITH MICROSCOPIC
Bacteria, UA: NONE SEEN
Bilirubin Urine: NEGATIVE
Glucose, UA: NEGATIVE mg/dL
Hgb urine dipstick: NEGATIVE
Ketones, ur: NEGATIVE mg/dL
Leukocytes,Ua: NEGATIVE
Nitrite: NEGATIVE
Protein, ur: NEGATIVE mg/dL
Specific Gravity, Urine: 1.006 (ref 1.005–1.030)
Squamous Epithelial / HPF: NONE SEEN (ref 0–5)
pH: 7 (ref 5.0–8.0)

## 2020-10-07 LAB — LIPASE, BLOOD: Lipase: 28 U/L (ref 11–51)

## 2020-10-07 NOTE — Telephone Encounter (Signed)
Second message..  Patient walked into clinic today, states that she would like a call back.    Patient left a vmail. Says she's having pain on her right side. Believes it may be a gall stone issue. Wants an order for an ultrasound. Please call patient

## 2020-10-07 NOTE — ED Triage Notes (Signed)
Pt reports right side abd pain for 2 weeks.  Pt has nausea.  Intermittent back pain.  Denies urinary sx.  Pt alert  Speech clear.

## 2020-10-08 ENCOUNTER — Other Ambulatory Visit: Payer: Self-pay | Admitting: Psychiatry

## 2020-10-08 ENCOUNTER — Emergency Department
Admission: EM | Admit: 2020-10-08 | Discharge: 2020-10-08 | Disposition: A | Discharge status: Home / Self Care | Payer: Medicare Other | Attending: Emergency Medicine | Admitting: Emergency Medicine

## 2020-10-08 ENCOUNTER — Emergency Department: Payer: Medicare Other

## 2020-10-08 DIAGNOSIS — R1011 Right upper quadrant pain: Secondary | ICD-10-CM

## 2020-10-08 DIAGNOSIS — F411 Generalized anxiety disorder: Secondary | ICD-10-CM

## 2020-10-08 DIAGNOSIS — F4310 Post-traumatic stress disorder, unspecified: Secondary | ICD-10-CM

## 2020-10-08 NOTE — ED Provider Notes (Signed)
[facility] Emergency Department Provider Note   ____________________________________________   Event Date/Time   First MD Initiated Contact with Patient 10/08/20 0113     (approximate)  I have reviewed the triage vital signs and the nursing notes.   HISTORY  Chief Complaint Abdominal Pain    HPI Christine Mcneil is a 55 y.o. female with past medical history of hypertension, hyperlipidemia, diabetes, stroke, and chronic pain syndrome who presents to the ED complaining of abdominal pain.  Patient reports that she has had intermittent pain in the right upper quadrant of her abdomen rating around to her right flank for the past 2 weeks.  Pain is exacerbated with certain positions and she reports associated nausea, but she has not had any vomiting.  She has not had any fevers, dysuria, or hematuria.  She describes current symptoms as similar to when she was diagnosed with a choledocholithiasis requiring recent ERCP with stone removal.        Past Medical History:  Diagnosis Date  . Abdominal pain   . Anxiety and depression   . Atypical facial pain   . Bilateral occipital neuralgia   . Cervical spondylosis without myelopathy   . Chronic daily headache   . Chronic pain   . Chronic pelvic pain in female   . Chronic thoracic back pain   . Depression   . Diabetes mellitus without complication ([place])   . Diabetes mellitus, type II ([place])   . Dysphagia   . Dysrhythmia   . Fibromyalgia   . Hyperlipidemia   . Hypertension   . Hypothyroidism   . Insomnia   . Iron deficiency anemia   . Left hip pain   . Low back pain   . Migraines   . Numbness   . Optic neuropathy   . OSA (obstructive sleep apnea)   . Polyarthralgia   . Primary osteoarthritis of both hips   . Prothrombin gene mutation ([place])   . Pseudotumor cerebri   . Rectal bleeding   . Right shoulder pain   . Rotator cuff syndrome   . Stroke ([place])   . Thyroid disease   . TIA (transient ischemic  attack) 05/27/2017    Patient Active Problem List   Diagnosis Date Noted  . Elevated liver enzymes 09/25/2020  . Choledocholithiasis   . MDD (major depressive disorder), recurrent, in partial remission ([place]) 12/17/2019  . Aortic atherosclerosis ([place]) 10/30/2019  . Hemoptysis 10/29/2019  . Chronic cough 10/29/2019  . MDD (major depressive disorder), recurrent, in full remission ([place]) 09/10/2019  . Peripheral edema 06/28/2019  . Paroxysmal atrial fibrillation ([place]) 06/28/2019  . Episodic tension-type headache, not intractable 06/13/2019  . Atypical angina ([place]) 06/06/2019  . Lumbar radiculopathy 02/21/2019  . MDD (major depressive disorder), recurrent episode, moderate ([place]) 11/28/2018  . PTSD (post-traumatic stress disorder) 11/28/2018  . GAD (generalized anxiety disorder) 11/28/2018  . Insomnia due to mental disorder 11/28/2018  . Severe obesity (BMI >= 40) ([place]) 11/27/2018  . Polyp of sigmoid colon   . Iron deficiency anemia 01/19/2018  . 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 TIA (transient ischemic attack) 06/20/2017  . Primary osteoarthritis of both hips 06/20/2017  . Periodic limb movements of sleep 05/30/2017  . Shortness of breath 01/03/2017  . 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 Chronic pelvic pain in female 05/28/2016  . Migraine without aura and without status migrainosus, not intractable 05/11/2016  . Chronic anticoagulation 03/29/2016  . Encounter for monitoring  opioid maintenance therapy 11/04/2015  . Dysphagia, neurologic 09/16/2015  . Numbness 09/16/2015  . Anti-cardiolipin antibody positive 09/01/2015  . 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 H/O: CVA (cerebrovascular accident) 06/27/2015  . Anemia, iron deficiency 10/02/2014  . Chronic thoracic back pain 07/24/2014  . Bilateral occipital neuralgia 04/11/2014  . Hypothyroidism, unspecified 10/17/2013  . Type 2 diabetes, HbA1c goal < 7% (H[facility]) 10/17/2013  .  Cervicogenic headache 07/04/2013  . Cervical spondylosis without myelopathy 11/16/2012  . Rotator cuff syndrome 11/13/2012  . Sinus congestion 11/07/2012  . Abdominal pain 09/28/2012  . Rectal bleeding 09/28/2012  . Depression 02/22/2012  . GERD (gastroesophageal reflux disease) 02/22/2012  . Essential hypertension 02/22/2012  . Obesity, unspecified 02/22/2012  . Neuromuscular disorder ([place]) 02/22/2012  . Pain in joint, pelvic region and thigh 01/17/2012  . Insomnia due to medical condition 10/04/2011  . Right shoulder pain 10/04/2011  . Atypical facial pain 10/03/2011  . Chronic daily headache 10/03/2011  . Fibromyalgia 10/03/2011  . Hyperlipidemia, unspecified 10/03/2011    Past Surgical History:  Procedure Laterality Date  . ABDOMINAL HYSTERECTOMY     total  . CHOLECYSTECTOMY    . COLONOSCOPY WITH PROPOFOL N/A 03/21/2018   Procedure: COLONOSCOPY WITH PROPOFOL;  Surgeon: Lucilla Lame, MD;  Location: [facility] ENDOSCOPY;  Service: Endoscopy;  Laterality: N/A;  . ERCP N/A 08/19/2020   Procedure: ENDOSCOPIC RETROGRADE CHOLANGIOPANCREATOGRAPHY (ERCP);  Surgeon: Lucilla Lame, MD;  Location: [facility] ENDOSCOPY;  Service: Endoscopy;  Laterality: N/A;  . ESOPHAGOGASTRODUODENOSCOPY (EGD) WITH PROPOFOL N/A 03/21/2018   Procedure: ESOPHAGOGASTRODUODENOSCOPY (EGD) WITH PROPOFOL;  Surgeon: Lucilla Lame, MD;  Location: [facility] ENDOSCOPY;  Service: Endoscopy;  Laterality: N/A;  . GIVENS CAPSULE STUDY N/A 09/24/2019   Procedure: GIVENS CAPSULE STUDY;  Surgeon: Virgel Manifold, MD;  Location: [facility] ENDOSCOPY;  Service: Endoscopy;  Laterality: N/A;  . JOINT REPLACEMENT    . KNEE SURGERY Left   . ROTATOR CUFF REPAIR Right   . spinal neurostimulator      Prior to Admission medications   Medication Sig Start Date End Date Taking? Authorizing Provider  ACCU-CHEK AVIVA PLUS test strip  03/27/18   [provider]  ACCU-CHEK SOFTCLIX LANCETS lancets  11/07/17   [provider]  acetaZOLAMIDE  (DIAMOX) 250 MG tablet  06/09/20   [provider]  AIMOVIG 70 MG/ML SOAJ Inject 70 mg into the skin every 28 (twenty-eight) days. 06/15/17   [provider]  albuterol (VENTOLIN HFA) 108 (90 Base) MCG/ACT inhaler Inhale 2 puffs into the lungs every 6 (six) hours as needed for wheezing or shortness of breath. 03/08/19   Gregor Hams, MD  ARIPiprazole (ABILIFY) 5 MG tablet Take 0.5 tablets (2.5 mg total) by mouth daily. 08/04/20   Ursula Alert, MD  aspirin-acetaminophen-caffeine (EXCEDRIN MIGRAINE) [phone] MG tablet Take 2 tablets by mouth every 8 (eight) hours as needed for headache or migraine.     [provider]  atorvastatin (LIPITOR) 80 MG tablet Take 80 mg by mouth daily at 6 PM. 12/22/17   [provider]  Blood Glucose Monitoring Suppl (ACCU-CHEK AVIVA PLUS) w/Device KIT  11/07/17   [provider]  Carboxymethylcellulose Sod PF 0.5 % SOLN Apply 1 drop to eye daily as needed. 05/15/15   [provider]  Cholecalciferol (VITAMIN D3) 1000 units CAPS Take 1 capsule by mouth daily.    [provider]  clotrimazole (LOTRIMIN) 1 % cream Apply 1 application topically daily as needed. 12/05/17   [provider]  cyclobenzaprine (FLEXERIL) 5  MG tablet Take 5 mg by mouth 3 (three) times daily as needed.  12/08/17   [provider]  dabigatran (PRADAXA) 150 MG CAPS capsule Take 150 mg by mouth 2 (two) times daily. 10/12/16 09/25/20  [provider]  diltiazem (CARDIZEM CD) 240 MG 24 hr capsule Take by mouth. Take 1 capsule by mouth once daily 12/26/17 09/25/20  [provider]  DULoxetine (CYMBALTA) 30 MG capsule Take 1 capsule (30 mg total) by mouth daily. To be combined with 60 mg 08/29/20   Ursula Alert, MD  DULoxetine (CYMBALTA) 60 MG capsule Take 1 capsule (60 mg total) by mouth daily. To be combined with 30 mg 08/29/20   Ursula Alert, MD  esomeprazole (NEXIUM) 40 MG capsule Take 40 mg by mouth daily.     [provider]  famotidine (PEPCID) 40 MG tablet Take 40 mg by mouth 2 (two) times daily. 10/10/19   [provider]  ferrous sulfate 325 (65 FE) MG tablet Take by mouth. Take 325 mg daily with breakfast    [provider]  fluticasone (FLONASE) 50 MCG/ACT nasal spray Place 2 sprays into both nostrils as needed.  06/25/17   [provider]  furosemide (LASIX) 20 MG tablet Take 20 mg by mouth daily. 06/26/19 09/25/20  [provider]  gabapentin (NEURONTIN) 600 MG tablet Take 1 tablet (600 mg total) by mouth 2 (two) times daily. 08/29/20   Ursula Alert, MD  hydrOXYzine (VISTARIL) 25 MG capsule TAKE (1) CAPSULE BY MOUTH TWICE DAILY ASNEEDED FOR SEVERE ANXIETY ATTACKS. 07/08/20   Ursula Alert, MD  Insulin Degludec (TRESIBA FLEXTOUCH) 200 UNIT/ML SOPN Inject 200 Units into the skin daily. 11/22/18   [provider]  insulin lispro (HUMALOG) 100 UNIT/ML KwikPen Inject up to 50 units daily in divided doses, as directed 04/14/20   [provider]  Insulin Pen Needle (BD ULTRA-FINE PEN NEEDLES) 29G X 12.7MM MISC Use 3 times daily as directed 03/07/20   [provider]  levothyroxine (SYNTHROID, LEVOTHROID) 100 MCG tablet Take 100 mcg by mouth daily. 08/18/15   [provider]  losartan (COZAAR) 25 MG tablet Take 12.5 mg by mouth daily. 04/11/20   [provider]  Magnesium 250 MG TABS Take 1 tablet by mouth daily.    [provider]  Melatonin 10 MG TABS Take 2 tablets by mouth at bedtime. 08/15/19   [provider]  meloxicam (MOBIC) 15 MG tablet Take 15 mg by mouth daily. 06/09/20   [provider]  metFORMIN (GLUCOPHAGE-XR) 500 MG 24 hr tablet Take 500 mg by mouth 2 (two) times daily.  02/17/18   [provider]  metoprolol succinate (TOPROL-XL) 25 MG 24 hr tablet Take 1 tablet by mouth daily. 05/13/20   [provider]  naloxone [facility]) nasal spray 4 mg/0.1 mL 1 spray into nose  for opioid overdose. If needed, may repeat spray in 2-3 min 03/14/18   [provider]  NUCYNTA ER 100 MG 12 hr tablet Take 1 tablet by mouth 2 (two) times daily. 05/30/17   [provider]  nystatin (MYCOSTATIN) 100000 UNIT/ML suspension Take 5 mLs by mouth 4 (four) times daily. 09/06/19   [provider]  omeprazole (PRILOSEC) 20 MG capsule TAKE (1) CAPSULE BY MOUTH EVERY DAY 08/30/19   [name] B, MD  ondansetron (ZOFRAN) 4 MG tablet Take 4 mg by mouth at bedtime. 07/14/20   [provider]  oxyCODONE (OXY IR/ROXICODONE) 5 MG immediate release tablet Take 5  mg by mouth 2 (two) times daily as needed. 06/06/20   [provider]  pioglitazone (ACTOS) 15 MG tablet Take by mouth. Take 1 tablet by mouth once daily 11/07/17 09/25/20  [provider]  quinapril (ACCUPRIL) 10 MG tablet Take 10 mg by mouth daily. 05/01/13   [provider]  Riboflavin 400 MG TABS Take 1 tablet by mouth every night  for headaches 06/11/20   [provider]  Rimegepant Sulfate (NURTEC) 75 MG TBDP Take 1 tablet by mouth as needed.  01/12/19   [provider]  traZODone (DESYREL) 100 MG tablet Take 1.5-2 tablets (150-200 mg total) by mouth at bedtime as needed for sleep. 07/07/20   Ursula Alert, MD  TRESIBA FLEXTOUCH 200 UNIT/ML SOPN Inject 76 Units into the skin at bedtime. 11/22/18   [provider]    Allergies Amoxapine, Amoxicillin, Dapagliflozin, Keflex [cephalexin], and Mirtazapine  Family History  Problem Relation Age of Onset  . Diabetes Mother   . Hyperlipidemia Mother   . Hypertension Mother   . COPD Mother   . CVA Mother   . Anemia Mother   . Diabetes Father   . Hyperlipidemia Father   . Hypertension Father   . Thyroid disease Father   . Alcohol abuse Father   . Peripheral vascular disease Sister   . Hypertension Sister   . Anxiety disorder Son   . Depression Son   . Bipolar disorder Son   . Seizures Son      Social History Social History   Tobacco Use  . Smoking status: Never Smoker  . Smokeless tobacco: Never Used  Vaping Use  . Vaping Use: Never used  Substance Use Topics  . Alcohol use: No  . Drug use: No    Review of Systems  Constitutional: No fever/chills Eyes: No visual changes. ENT: No sore throat. Cardiovascular: Denies chest pain. Respiratory: Denies shortness of breath. Gastrointestinal: Positive for flank pain, abdominal pain, and nausea, no vomiting.  No diarrhea.  No constipation. Genitourinary: Negative for dysuria. Musculoskeletal: Negative for back pain. Skin: Negative for rash. Neurological: Negative for headaches, focal weakness or numbness.  ____________________________________________   PHYSICAL EXAM:  VITAL SIGNS: ED Triage Vitals  Enc Vitals Group     BP 10/07/20 2102 (!) 126/91     Pulse Rate 10/07/20 2102 88     Resp 10/07/20 2102 20     Temp 10/07/20 2102 97.8 F (36.6 C)     Temp Source 10/07/20 2102 Oral     SpO2 10/07/20 2102 95 %     Weight 10/07/20 2103 260 lb (117.9 kg)     Height 10/07/20 2103 5' 6"  (1.676 m)     Head Circumference --      Peak Flow --      Pain Score 10/07/20 2107 7     Pain Loc --      Pain Edu? --      Excl. in [place]? --     Constitutional: Alert and oriented. Eyes: Conjunctivae are normal. Head: Atraumatic. Nose: No congestion/rhinnorhea. Mouth/Throat: Mucous membranes are moist. Neck: Normal ROM Cardiovascular: Normal rate, regular rhythm. Grossly normal heart sounds. Respiratory: Normal respiratory effort.  No retractions. Lungs CTAB. Gastrointestinal: Soft and tender to palpation in the right upper quadrant with no rebound or guarding.  No CVA tenderness bilaterally. No distention. Genitourinary: deferred Musculoskeletal: No lower extremity tenderness nor edema. Neurologic:  Normal speech and language. No gross focal neurologic deficits are appreciated. Skin:  Skin is  warm, dry and intact. No rash  noted. Psychiatric: Mood and affect are normal. Speech and behavior are normal.  ____________________________________________   LABS (all labs ordered are listed, but only abnormal results are displayed)  Labs Reviewed  COMPREHENSIVE METABOLIC PANEL - Abnormal; Notable for the following components:      Result Value   Glucose, Bld 192 (*)    All other components within normal limits  URINALYSIS, COMPLETE (UACMP) WITH MICROSCOPIC - Abnormal; Notable for the following components:   Color, Urine YELLOW (*)    APPearance HAZY (*)    All other components within normal limits  LIPASE, BLOOD  CBC    PROCEDURES  Procedure(s) performed (including Critical Care):  Procedures   ____________________________________________   INITIAL IMPRESSION / ASSESSMENT AND PLAN / ED COURSE       55 year old female with past medical history of hypertension, hyperlipidemia, diabetes, stroke, and chronic pain syndrome who presents to the ED for 2 weeks of intermittent pain in the right upper quadrant of her abdomen radiating to towards her right flank.  She has some tenderness to palpation in the right upper quadrant but no CVA tenderness on either side.  She did recently have choledocholithiasis requiring ERCP and removal.  Labs today are reassuring, no elevation in bilirubin or transaminitis to suggest recurrent choledocholithiasis.  UA shows no signs of infection.  We will further assess with CT scan to rule out kidney stone or other signs of biliary dilatation.  She is status post cholecystectomy.  CT scan shows no right-sided kidney stones and no biliary dilatation.  No obvious source of patient's right upper quadrant pain at this time but given reassuring work-up, she is appropriate for discharge home with PCP follow-up.  She was counseled to return to the ED for new or worsening symptoms, patient agrees with plan.      ____________________________________________   FINAL CLINICAL  IMPRESSION(S) / ED DIAGNOSES  Final diagnoses:  Right upper quadrant abdominal pain     ED Discharge Orders    None       Note:  This document was prepared using Dragon voice recognition software and may include unintentional dictation errors.   Blake Divine, MD 10/08/20 [phone]

## 2020-10-10 ENCOUNTER — Telehealth: Payer: Medicare Other | Admitting: Psychiatry

## 2020-10-14 NOTE — Telephone Encounter (Signed)
Patient was contacted until 10/14/2020 due to me being out of the office. However, when I called the patient I had seen that she had been to the ED on Oct 08, 2020 and they didn't find anything on her labs and images. I then asked patient how she was feeling and she stated that her symptoms had resolved. Patient denied any abdominal pain, nausea, vomiting, chills or fever. I told patient to call us back in case her symptoms developed again. Patient understood and had no further questions.

## 2020-10-15 ENCOUNTER — Telehealth: Payer: Self-pay | Admitting: Gastroenterology

## 2020-10-15 NOTE — Telephone Encounter (Signed)
Patient would like call back to discuss her right abdominal pain going into her back

## 2020-10-15 NOTE — Telephone Encounter (Signed)
Called patient and had to leave her a voicemail to call us back.

## 2020-10-16 DIAGNOSIS — R1011 Right upper quadrant pain: Secondary | ICD-10-CM

## 2020-10-16 DIAGNOSIS — R748 Abnormal levels of other serum enzymes: Secondary | ICD-10-CM

## 2020-10-17 ENCOUNTER — Telehealth: Payer: Self-pay

## 2020-10-17 NOTE — Telephone Encounter (Signed)
Pt is aware... lab future ordered and pt is agreeable to coming in on May 31 for labs between 8:30-4

## 2020-10-21 ENCOUNTER — Other Ambulatory Visit: Payer: Self-pay

## 2020-10-21 DIAGNOSIS — R1011 Right upper quadrant pain: Secondary | ICD-10-CM

## 2020-10-21 DIAGNOSIS — R748 Abnormal levels of other serum enzymes: Secondary | ICD-10-CM

## 2020-10-22 LAB — COMPREHENSIVE METABOLIC PANEL
ALT: 34 IU/L — ABNORMAL HIGH (ref 0–32)
AST: 31 IU/L (ref 0–40)
Albumin/Globulin Ratio: 1.8 (ref 1.2–2.2)
Albumin: 4.7 g/dL (ref 3.8–4.9)
Alkaline Phosphatase: 119 IU/L (ref 44–121)
BUN/Creatinine Ratio: 7 — ABNORMAL LOW (ref 9–23)
BUN: 7 mg/dL (ref 6–24)
Bilirubin Total: 0.3 mg/dL (ref 0.0–1.2)
CO2: 21 mmol/L (ref 20–29)
Calcium: 8.9 mg/dL (ref 8.7–10.2)
Chloride: 97 mmol/L (ref 96–106)
Creatinine, Ser: 0.99 mg/dL (ref 0.57–1.00)
Globulin, Total: 2.6 g/dL (ref 1.5–4.5)
Glucose: 233 mg/dL — ABNORMAL HIGH (ref 65–99)
Potassium: 4.1 mmol/L (ref 3.5–5.2)
Sodium: 137 mmol/L (ref 134–144)
Total Protein: 7.3 g/dL (ref 6.0–8.5)
eGFR: 67 mL/min/{1.73_m2} (ref >59)

## 2020-12-08 ENCOUNTER — Telehealth (INDEPENDENT_AMBULATORY_CARE_PROVIDER_SITE_OTHER): Payer: Medicare Other | Admitting: Psychiatry

## 2020-12-08 ENCOUNTER — Other Ambulatory Visit: Payer: Self-pay

## 2020-12-08 ENCOUNTER — Encounter: Payer: Self-pay | Admitting: Psychiatry

## 2020-12-08 DIAGNOSIS — G4701 Insomnia due to medical condition: Secondary | ICD-10-CM

## 2020-12-08 DIAGNOSIS — F3342 Major depressive disorder, recurrent, in full remission: Secondary | ICD-10-CM

## 2020-12-08 DIAGNOSIS — F4310 Post-traumatic stress disorder, unspecified: Secondary | ICD-10-CM | POA: Diagnosis not present

## 2020-12-08 DIAGNOSIS — F411 Generalized anxiety disorder: Secondary | ICD-10-CM

## 2020-12-08 DIAGNOSIS — Z634 Disappearance and death of family member: Secondary | ICD-10-CM

## 2020-12-08 MED ORDER — DULOXETINE HCL 60 MG PO CPEP: 60.0000 mg | ORAL_CAPSULE | Freq: Every day | ORAL | 0 refills | Status: DC

## 2020-12-08 MED ORDER — DULOXETINE HCL 30 MG PO CPEP: 30.0000 mg | ORAL_CAPSULE | Freq: Every day | ORAL | 0 refills | Status: DC

## 2020-12-08 MED ORDER — GABAPENTIN 600 MG PO TABS: 600.0000 mg | ORAL_TABLET | Freq: Two times a day (BID) | ORAL | 0 refills | Status: DC

## 2020-12-08 MED ORDER — ARIPIPRAZOLE 5 MG PO TABS: 5.0000 mg | ORAL_TABLET | Freq: Every day | ORAL | 0 refills | Status: DC

## 2020-12-08 MED ORDER — TRAZODONE HCL 100 MG PO TABS
ORAL_TABLET | ORAL | 0 refills | Status: DC
Start: 2020-12-08 — End: 2021-02-23

## 2020-12-08 NOTE — Progress Notes (Signed)
Virtual Visit via Telephone Note  I connected with Christine Mcneil on 12/08/20 at  9:30 AM EDT by telephone and verified that I am speaking with the correct person using two identifiers.  Location Provider Location : [facility] Patient Location : Home  Participants: Patient , Provider   I discussed the limitations, risks, security and privacy concerns of performing an evaluation and management service by telephone and the availability of in person appointments. I also discussed with the patient that there may be a patient responsible charge related to this service. The patient expressed understanding and agreed to proceed.    I discussed the assessment and treatment plan with the patient. The patient was provided an opportunity to ask questions and all were answered. The patient agreed with the plan and demonstrated an understanding of the instructions.   The patient was advised to call back or seek an in-person evaluation if the symptoms worsen or if the condition fails to improve as anticipated.    [place] MD OP Progress Note  12/08/2020 11:56 AM Christine Mcneil  MRN:  [id number]  Chief Complaint:  Chief Complaint   Follow-up; Anxiety; Depression    HPI: Christine Mcneil is a 55 year old Caucasian female, divorced, lives in [place], has a history of MDD, PTSD, insomnia, OSA-resolved, history of CVA, history of prothrombin gene mutation, chronic pain, migraine headaches, iron deficiency was evaluated by telemedicine today.  Patient today reports she lost her pet dog of 17 years recently.  She reports she is currently grieving the loss of her pet.  She often has crying spells, sadness.  She also had sleep problems although the sleep issues are improving some.  Patient reports appetite as affected.  She however has been making an attempt to eat.  She denies any suicidality, homicidality or perceptual disturbances.  She is compliant on her medications.  She was advised to reduce the Abilify dosage last  visit with plan to taper off.  Patient however today reports she stayed on the 5 mg.  Patient reports she was also continued on the Lamictal  by her neurologist for trigeminal neuralgia.  Patient denies any other concerns today.  Visit Diagnosis:    ICD-10-CM   1. MDD (major depressive disorder), recurrent, in full remission ([place])  F33.42 ARIPiprazole (ABILIFY) 5 MG tablet    2. PTSD (post-traumatic stress disorder)  F43.10 traZODone (DESYREL) 100 MG tablet    DULoxetine (CYMBALTA) 30 MG capsule    DULoxetine (CYMBALTA) 60 MG capsule    gabapentin (NEURONTIN) 600 MG tablet    3. GAD (generalized anxiety disorder)  F41.1 gabapentin (NEURONTIN) 600 MG tablet    4. Insomnia due to medical condition  G47.01    pain, mood    5. Bereavement  Z63.4       Past Psychiatric History: I have reviewed past psychiatric history from progress note on 08/16/2017.  Past trials of Effexor, Klonopin  Past Medical History:  Past Medical History:  Diagnosis Date   Abdominal pain    Anxiety and depression    Atypical facial pain    Bilateral occipital neuralgia    Cervical spondylosis without myelopathy    Chronic daily headache    Chronic pain    Chronic pelvic pain in female    Chronic thoracic back pain    Depression    Diabetes mellitus without complication ([place])    Diabetes mellitus, type II (HCC)    Dysphagia    Dysrhythmia    Fibromyalgia  Hyperlipidemia    Hypertension    Hypothyroidism    Insomnia    Iron deficiency anemia    Left hip pain    Low back pain    Migraines    Numbness    Optic neuropathy    OSA (obstructive sleep apnea)    Polyarthralgia    Primary osteoarthritis of both hips    Prothrombin gene mutation (HCC)    Pseudotumor cerebri    Rectal bleeding    Right shoulder pain    Rotator cuff syndrome    Stroke [facility])    Thyroid disease    TIA (transient ischemic attack) 05/27/2017    Past Surgical History:  Procedure Laterality Date   ABDOMINAL  HYSTERECTOMY     total   CHOLECYSTECTOMY     COLONOSCOPY WITH PROPOFOL N/A 03/21/2018   Procedure: COLONOSCOPY WITH PROPOFOL;  Surgeon: Lucilla Lame, MD;  Location: [facility] ENDOSCOPY;  Service: Endoscopy;  Laterality: N/A;   ERCP N/A 08/19/2020   Procedure: ENDOSCOPIC RETROGRADE CHOLANGIOPANCREATOGRAPHY (ERCP);  Surgeon: Lucilla Lame, MD;  Location: [facility] ENDOSCOPY;  Service: Endoscopy;  Laterality: N/A;   ESOPHAGOGASTRODUODENOSCOPY (EGD) WITH PROPOFOL N/A 03/21/2018   Procedure: ESOPHAGOGASTRODUODENOSCOPY (EGD) WITH PROPOFOL;  Surgeon: Lucilla Lame, MD;  Location: [facility] ENDOSCOPY;  Service: Endoscopy;  Laterality: N/A;   GIVENS CAPSULE STUDY N/A 09/24/2019   Procedure: GIVENS CAPSULE STUDY;  Surgeon: Virgel Manifold, MD;  Location: [facility] ENDOSCOPY;  Service: Endoscopy;  Laterality: N/A;   JOINT REPLACEMENT     KNEE SURGERY Left    ROTATOR CUFF REPAIR Right    spinal neurostimulator      Family Psychiatric History: I have reviewed family psychiatric history from progress note on 08/16/2017  Family History:  Family History  Problem Relation Age of Onset   Diabetes Mother    Hyperlipidemia Mother    Hypertension Mother    COPD Mother    CVA Mother    Anemia Mother    Diabetes Father    Hyperlipidemia Father    Hypertension Father    Thyroid disease Father    Alcohol abuse Father    Peripheral vascular disease Sister    Hypertension Sister    Anxiety disorder Son    Depression Son    Bipolar disorder Son    Seizures Son     Social History: Reviewed social history from progress note on 08/16/2017 Social History   Socioeconomic History   Marital status: Divorced    Spouse name: Not on file   Number of children: 1   Years of education: Not on file   Highest education level: High school graduate  Occupational History    Comment: disablitiy  Tobacco Use   Smoking status: Never   Smokeless tobacco: Never  Vaping Use   Vaping Use: Never used  Substance and Sexual Activity    Alcohol use: No   Drug use: No   Sexual activity: Not Currently  Other Topics Concern   Not on file  Social History Narrative   Not on file   Social Determinants of Health   Financial Resource Strain: Not on file  Food Insecurity: Not on file  Transportation Needs: Not on file  Physical Activity: Not on file  Stress: Not on file  Social Connections: Not on file    Allergies:  Allergies  Allergen Reactions   Amoxapine Itching   Amoxicillin Itching   Dapagliflozin Itching    Other reaction(s): Other (See Comments) Thrush & vaginal itching   Keflex [Cephalexin] Itching  Mirtazapine Other (See Comments)    Pt states felling "like my body is outside of me."    Metabolic Disorder Labs: Lab Results  Component Value Date   HGBA1C 9.9 (H) 06/21/2017   MPG 237.43 06/21/2017   Lab Results  Component Value Date   PROLACTIN 5.5 09/06/2017   Lab Results  Component Value Date   CHOL 277 (H) 06/21/2017   TRIG 196 (H) 06/21/2017   HDL 64 06/21/2017   CHOLHDL 4.3 06/21/2017   VLDL 39 06/21/2017   LDLCALC 174 (H) 06/21/2017   Lab Results  Component Value Date   TSH 2.890 09/06/2017   TSH 3.176 01/04/2017    Therapeutic Level Labs: No results found for: LITHIUM No results found for: VALPROATE No components found for:  CBMZ  Current Medications: Current Outpatient Medications  Medication Sig Dispense Refill   acetaZOLAMIDE (DIAMOX) 250 MG tablet Take by mouth.     ACCU-CHEK AVIVA PLUS test strip      ACCU-CHEK SOFTCLIX LANCETS lancets  (Patient not taking: Reported on 12/08/2020)     acetaZOLAMIDE (DIAMOX) 250 MG tablet  (Patient not taking: Reported on 12/08/2020)     AIMOVIG 70 MG/ML SOAJ Inject 70 mg into the skin every 28 (twenty-eight) days.     albuterol (VENTOLIN HFA) 108 (90 Base) MCG/ACT inhaler Inhale 2 puffs into the lungs every 6 (six) hours as needed for wheezing or shortness of breath. 1 g 0   ARIPiprazole (ABILIFY) 5 MG tablet Take 1 tablet (5 mg total)  by mouth daily. 90 tablet 0   aspirin-acetaminophen-caffeine (EXCEDRIN MIGRAINE) 250-250-65 MG tablet Take 2 tablets by mouth every 8 (eight) hours as needed for headache or migraine.      atorvastatin (LIPITOR) 80 MG tablet Take 80 mg by mouth daily at 6 PM.     Blood Glucose Monitoring Suppl (ACCU-CHEK AVIVA PLUS) w/Device KIT      Carboxymethylcellulose Sod PF 0.5 % SOLN Apply 1 drop to eye daily as needed.     Cholecalciferol (VITAMIN D3) 1000 units CAPS Take 1 capsule by mouth daily.     clotrimazole (LOTRIMIN) 1 % cream Apply 1 application topically daily as needed.     cyclobenzaprine (FLEXERIL) 5 MG tablet Take 5 mg by mouth 3 (three) times daily as needed.      dabigatran (PRADAXA) 150 MG CAPS capsule Take 150 mg by mouth 2 (two) times daily.     diltiazem (CARDIZEM CD) 240 MG 24 hr capsule Take by mouth. Take 1 capsule by mouth once daily     DULoxetine (CYMBALTA) 30 MG capsule Take 1 capsule (30 mg total) by mouth daily. To be combined with 60 mg 90 capsule 0   DULoxetine (CYMBALTA) 60 MG capsule Take 1 capsule (60 mg total) by mouth daily. To be combined with 30 mg 90 capsule 0   esomeprazole (NEXIUM) 40 MG capsule Take 40 mg by mouth daily.     famotidine (PEPCID) 40 MG tablet Take 40 mg by mouth 2 (two) times daily.     ferrous sulfate 325 (65 FE) MG tablet Take by mouth. Take 325 mg daily with breakfast     fluticasone (FLONASE) 50 MCG/ACT nasal spray Place 2 sprays into both nostrils as needed.      furosemide (LASIX) 20 MG tablet Take 20 mg by mouth daily.     gabapentin (NEURONTIN) 600 MG tablet Take 1 tablet (600 mg total) by mouth 2 (two) times daily. 180 tablet 0   hydrOXYzine (VISTARIL)  25 MG capsule TAKE (1) CAPSULE BY MOUTH TWICE DAILY ASNEEDED FOR SEVERE ANXIETY ATTACKS. 180 capsule 0   Insulin Degludec (TRESIBA FLEXTOUCH) 200 UNIT/ML SOPN Inject 200 Units into the skin daily.     insulin lispro (HUMALOG) 100 UNIT/ML KwikPen Inject up to 50 units daily in divided doses,  as directed     Insulin Pen Needle (BD ULTRA-FINE PEN NEEDLES) 29G X 12.7MM MISC Use 3 times daily as directed     lamoTRIgine (LAMICTAL) 100 MG tablet Take 100 mg by mouth daily.     levothyroxine (SYNTHROID) 112 MCG tablet Take 112 mcg by mouth daily.     levothyroxine (SYNTHROID, LEVOTHROID) 100 MCG tablet Take 100 mcg by mouth daily. (Patient not taking: Reported on 12/08/2020)     losartan (COZAAR) 25 MG tablet Take 12.5 mg by mouth daily.     losartan (COZAAR) 25 MG tablet Take by mouth. (Patient not taking: Reported on 12/08/2020)     Magnesium 250 MG TABS Take 1 tablet by mouth daily.     magnesium oxide (MAG-OX) 400 MG tablet Take 1 tablet by mouth daily.     Melatonin 10 MG TABS Take 2 tablets by mouth at bedtime.     meloxicam (MOBIC) 15 MG tablet Take 15 mg by mouth daily.     metFORMIN (GLUCOPHAGE-XR) 500 MG 24 hr tablet Take 500 mg by mouth 2 (two) times daily.      metoprolol succinate (TOPROL-XL) 25 MG 24 hr tablet Take 1 tablet by mouth daily.     naloxone (NARCAN) nasal spray 4 mg/0.1 mL 1 spray into nose for opioid overdose. If needed, may repeat spray in 2-3 min     NUCYNTA ER 100 MG 12 hr tablet Take 1 tablet by mouth 2 (two) times daily.     nystatin (MYCOSTATIN) 100000 UNIT/ML suspension Take 5 mLs by mouth 4 (four) times daily.     omeprazole (PRILOSEC) 20 MG capsule TAKE (1) CAPSULE BY MOUTH EVERY DAY 30 capsule 0   ondansetron (ZOFRAN) 4 MG tablet Take 4 mg by mouth at bedtime.     oxyCODONE (OXY IR/ROXICODONE) 5 MG immediate release tablet Take 5 mg by mouth 2 (two) times daily as needed.     pioglitazone (ACTOS) 15 MG tablet Take by mouth. Take 1 tablet by mouth once daily     quinapril (ACCUPRIL) 10 MG tablet Take 10 mg by mouth daily.     Riboflavin 400 MG TABS Take 1 tablet by mouth every night  for headaches     Rimegepant Sulfate (NURTEC) 75 MG TBDP Take 1 tablet by mouth as needed.      traZODone (DESYREL) 100 MG tablet TAKE 1.5-2 TABLETS BY MOUTH AT BEDTIME AS  NEEDED FOR SLEEP 180 tablet 0   TRESIBA FLEXTOUCH 200 UNIT/ML SOPN Inject 76 Units into the skin at bedtime.     No current facility-administered medications for this visit.     Musculoskeletal: Strength & Muscle Tone:  UTA Gait & Station:  UTA Patient leans: N/A  Psychiatric Specialty Exam: Review of Systems  Psychiatric/Behavioral:  Positive for dysphoric mood and sleep disturbance.   All other systems reviewed and are negative.  There were no vitals taken for this visit.There is no height or weight on file to calculate BMI.  General Appearance:  UTA  Eye Contact:   UTA  Speech:  Clear and Coherent  Volume:  Normal  Mood:  Depressed  Affect:   UTA  Thought Process:  Goal  Directed and Descriptions of Associations: Intact  Orientation:  Full (Time, Place, and Person)  Thought Content: Logical   Suicidal Thoughts:  No  Homicidal Thoughts:  No  Memory:  Immediate;   Fair Recent;   Fair Remote;   Fair  Judgement:  Fair  Insight:  Fair  Psychomotor Activity:   UTA  Concentration:  Concentration: Fair and Attention Span: Fair  Recall:  [employer] of Knowledge: Fair  Language: Fair  Akathisia:  No  Handed:  Right  AIMS (if indicated): not done  Assets:  Communication Skills Desire for Improvement Social Support Transportation  ADL's:  Intact  Cognition: WNL  Sleep:  Poor   Screenings: PHQ2-9    Flowsheet Row Video Visit from 12/08/2020 in [facility] [place] Video Visit from 08/04/2020 in [place] Video Visit from 07/07/2020 in [facility]  PHQ-2 Total Score 5 1 1   PHQ-9 Total Score 10 -- --      Flowsheet Row Video Visit from 12/08/2020 in [facility] from 10/08/2020 in [place] Admission (Discharged) from 08/19/2020 in [place] Low Risk No Risk No Risk         Assessment and Plan: Christine Mcneil is a 55 year old Caucasian female who has a history of MDD, PTSD, chronic pain, migraine headaches, history of CVA, iron deficiency, prothrombin gene mutation was evaluated by telemedicine today.  Patient is currently grieving the loss of her pet.  She will benefit from continued medication management and psychotherapy sessions.  Plan MDD in full remission Cymbalta 90 mg p.o. daily Lamictal 125 mg p.o. daily Continue Abilify 5 mg p.o. daily. Long-term plan will be to taper off.  GAD-stable Cymbalta 90 mg p.o. daily Hydroxyzine 25 mg daily as needed for anxiety attacks  Insomnia-unstable Continue sleep hygiene techniques Advised patient to use hydroxyzine 25 mg as needed for sleep as well. Trazodone 150-200 mg p.o. nightly Melatonin 3 to 9 mg p.o. nightly  Bereavement-unstable Continue medications as noted above. Refer patient for psychotherapy sessions.  I have sent a referral to our staff.  Follow-up in clinic in 4 weeks or sooner if needed.   I have spent at least 22 minutes non face to face with patient today.   This note was generated in part or whole with voice recognition software. Voice recognition is usually quite accurate but there are transcription errors that can and very often do occur. I apologize for any typographical errors that were not detected and corrected.     Ursula Alert, MD 12/08/2020, 11:56 AM

## 2020-12-16 ENCOUNTER — Other Ambulatory Visit: Payer: Self-pay

## 2020-12-16 ENCOUNTER — Inpatient Hospital Stay: Payer: Medicare Other | Attending: Oncology

## 2020-12-16 DIAGNOSIS — D6852 Prothrombin gene mutation: Secondary | ICD-10-CM | POA: Diagnosis not present

## 2020-12-16 DIAGNOSIS — D509 Iron deficiency anemia, unspecified: Secondary | ICD-10-CM

## 2020-12-16 DIAGNOSIS — Z90710 Acquired absence of both cervix and uterus: Secondary | ICD-10-CM | POA: Insufficient documentation

## 2020-12-16 DIAGNOSIS — Z86718 Personal history of other venous thrombosis and embolism: Secondary | ICD-10-CM | POA: Insufficient documentation

## 2020-12-16 DIAGNOSIS — Z8673 Personal history of transient ischemic attack (TIA), and cerebral infarction without residual deficits: Secondary | ICD-10-CM | POA: Diagnosis not present

## 2020-12-16 LAB — CBC
HCT: 39 % (ref 36.0–46.0)
Hemoglobin: 12.8 g/dL (ref 12.0–15.0)
MCH: 29.4 pg (ref 26.0–34.0)
MCHC: 32.8 g/dL (ref 30.0–36.0)
MCV: 89.7 fL (ref 80.0–100.0)
Platelets: 186 10*3/uL (ref 150–400)
RBC: 4.35 MIL/uL (ref 3.87–5.11)
RDW: 13.3 % (ref 11.5–15.5)
WBC: 5.3 10*3/uL (ref 4.0–10.5)
nRBC: 0 % (ref 0.0–0.2)

## 2020-12-16 LAB — FERRITIN: Ferritin: 41 ng/mL (ref 11–307)

## 2020-12-17 ENCOUNTER — Inpatient Hospital Stay: Payer: Medicare Other

## 2020-12-17 ENCOUNTER — Inpatient Hospital Stay (HOSPITAL_BASED_OUTPATIENT_CLINIC_OR_DEPARTMENT_OTHER): Payer: Medicare Other | Admitting: Oncology

## 2020-12-17 ENCOUNTER — Encounter: Payer: Self-pay | Admitting: Oncology

## 2020-12-17 VITALS — BP 112/66 | HR 78 | Temp 97.2°F | Resp 18 | Wt 254.9 lb

## 2020-12-17 DIAGNOSIS — D509 Iron deficiency anemia, unspecified: Secondary | ICD-10-CM

## 2020-12-17 DIAGNOSIS — D508 Other iron deficiency anemias: Secondary | ICD-10-CM

## 2020-12-17 NOTE — Progress Notes (Signed)
Hematology/Oncology Consult note [facility]  [phone] [phone]  Patient Care Team: Doughton, Latricia Heft, MD as PCP - General (Student) Lucilla Lame, MD as Consulting Physician (Gastroenterology) Lequita Asal, MD as Referring Physician (Hematology and Oncology)   Name of the patient: Christine Mcneil  [id number]  15-Apr-1966   Date of visit: 12/17/20  Diagnosis-history of iron deficiency anemia  Chief complaint/ Reason for visit-routine follow-up of iron deficiency anemia   Heme/Onc history: Patient is a 55 year old female is a transfer of care from Dr. Mike Gip for history of iron deficiency anemia which has been followed since 2019.EGD on 03/21/2018 was normal.  Colonoscopy on 03/21/2018 revealed for 1 to 2 mm polyps in the sigmoid colon and internal hemorrhoids.  Pathology revealed hyperplastic polyps.  Patient also had capsule study in May 2021 which was inconclusive as it did not enter the small bowel.   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  She has a prothrombin gene mutation.  She is on long term anticoagulation with Pradaxa.   Interval history-patient reports feeling some fatigue and some ongoing dehydration due to lack of water intake.  States that her blood pressure is somewhat lower as compared to her baseline when her systolic blood pressures around 130.  Denies any bleeding in her stool or urine.  Denies any dark melanotic stools  ECOG PS- 1 Pain scale- 0  Review of systems- Review of Systems  Constitutional:  Negative for chills, fever, malaise/fatigue and weight loss.  HENT:  Negative for congestion, ear discharge and nosebleeds.   Eyes:  Negative for blurred vision.  Respiratory:  Negative for cough, hemoptysis, sputum production, shortness of breath and wheezing.   Cardiovascular:  Negative for chest pain, palpitations, orthopnea and claudication.  Gastrointestinal:  Negative for abdominal pain,  blood in stool, constipation, diarrhea, heartburn, melena, nausea and vomiting.  Genitourinary:  Negative for dysuria, flank pain, frequency, hematuria and urgency.  Musculoskeletal:  Negative for back pain, joint pain and myalgias.  Skin:  Negative for rash.  Neurological:  Negative for dizziness, tingling, focal weakness, seizures, weakness and headaches.  Endo/Heme/Allergies:  Does not bruise/bleed easily.  Psychiatric/Behavioral:  Negative for depression and suicidal ideas. The patient does not have insomnia.      Allergies  Allergen Reactions   Amoxapine Itching   Amoxicillin Itching   Dapagliflozin Itching    Other reaction(s): Other (See Comments) Thrush & vaginal itching   Keflex [Cephalexin] Itching   Mirtazapine Other (See Comments)    Pt states felling "like my body is outside of me."     Past Medical History:  Diagnosis Date   Abdominal pain    Anxiety and depression    Atypical facial pain    Bilateral occipital neuralgia    Cervical spondylosis without myelopathy    Chronic daily headache    Chronic pain    Chronic pelvic pain in female    Chronic thoracic back pain    Depression    Diabetes mellitus without complication (HCC)    Diabetes mellitus, type II (HCC)    Dysphagia    Dysrhythmia    Fibromyalgia    Hyperlipidemia    Hypertension    Hypothyroidism    Insomnia    Iron deficiency anemia    Left hip pain    Low back pain    Migraines    Numbness    Optic neuropathy    OSA (obstructive sleep apnea)  Polyarthralgia    Primary osteoarthritis of both hips    Prothrombin gene mutation (HCC)    Pseudotumor cerebri    Rectal bleeding    Right shoulder pain    Rotator cuff syndrome    Stroke [facility])    Thyroid disease    TIA (transient ischemic attack) 05/27/2017     Past Surgical History:  Procedure Laterality Date   ABDOMINAL HYSTERECTOMY     total   CHOLECYSTECTOMY     COLONOSCOPY WITH PROPOFOL N/A 03/21/2018   Procedure: COLONOSCOPY  WITH PROPOFOL;  Surgeon: Lucilla Lame, MD;  Location: [facility] ENDOSCOPY;  Service: Endoscopy;  Laterality: N/A;   ERCP N/A 08/19/2020   Procedure: ENDOSCOPIC RETROGRADE CHOLANGIOPANCREATOGRAPHY (ERCP);  Surgeon: Lucilla Lame, MD;  Location: [facility] ENDOSCOPY;  Service: Endoscopy;  Laterality: N/A;   ESOPHAGOGASTRODUODENOSCOPY (EGD) WITH PROPOFOL N/A 03/21/2018   Procedure: ESOPHAGOGASTRODUODENOSCOPY (EGD) WITH PROPOFOL;  Surgeon: Lucilla Lame, MD;  Location: [facility] ENDOSCOPY;  Service: Endoscopy;  Laterality: N/A;   GIVENS CAPSULE STUDY N/A 09/24/2019   Procedure: GIVENS CAPSULE STUDY;  Surgeon: Virgel Manifold, MD;  Location: [facility] ENDOSCOPY;  Service: Endoscopy;  Laterality: N/A;   JOINT REPLACEMENT     KNEE SURGERY Left    ROTATOR CUFF REPAIR Right    spinal neurostimulator      Social History   Socioeconomic History   Marital status: Divorced    Spouse name: Not on file   Number of children: 1   Years of education: Not on file   Highest education level: High school graduate  Occupational History    Comment: disablitiy  Tobacco Use   Smoking status: Never   Smokeless tobacco: Never  Vaping Use   Vaping Use: Never used  Substance and Sexual Activity   Alcohol use: No   Drug use: No   Sexual activity: Not Currently  Other Topics Concern   Not on file  Social History Narrative   Not on file   Social Determinants of Health   Financial Resource Strain: Not on file  Food Insecurity: Not on file  Transportation Needs: Not on file  Physical Activity: Not on file  Stress: Not on file  Social Connections: Not on file  Intimate Partner Violence: Not on file    Family History  Problem Relation Age of Onset   Diabetes Mother    Hyperlipidemia Mother    Hypertension Mother    COPD Mother    CVA Mother    Anemia Mother    Diabetes Father    Hyperlipidemia Father    Hypertension Father    Thyroid disease Father    Alcohol abuse Father    Peripheral vascular disease Sister     Hypertension Sister    Anxiety disorder Son    Depression Son    Bipolar disorder Son    Seizures Son      Current Outpatient Medications:    ACCU-CHEK AVIVA PLUS test strip, , Disp: , Rfl:    ACCU-CHEK SOFTCLIX LANCETS lancets, , Disp: , Rfl:    acetaZOLAMIDE (DIAMOX) 250 MG tablet, , Disp: , Rfl:    acetaZOLAMIDE (DIAMOX) 250 MG tablet, Take by mouth., Disp: , Rfl:    AIMOVIG 70 MG/ML SOAJ, Inject 70 mg into the skin every 28 (twenty-eight) days., Disp: , Rfl:    albuterol (VENTOLIN HFA) 108 (90 Base) MCG/ACT inhaler, Inhale 2 puffs into the lungs every 6 (six) hours as needed for wheezing or shortness of breath., Disp: 1 g, Rfl: 0   ARIPiprazole (  ABILIFY) 5 MG tablet, Take 1 tablet (5 mg total) by mouth daily., Disp: 90 tablet, Rfl: 0   aspirin-acetaminophen-caffeine (EXCEDRIN MIGRAINE) 250-250-65 MG tablet, Take 2 tablets by mouth every 8 (eight) hours as needed for headache or migraine. , Disp: , Rfl:    atorvastatin (LIPITOR) 80 MG tablet, Take 80 mg by mouth daily at 6 PM., Disp: , Rfl:    Blood Glucose Monitoring Suppl (ACCU-CHEK AVIVA PLUS) w/Device KIT, , Disp: , Rfl:    Carboxymethylcellulose Sod PF 0.5 % SOLN, Apply 1 drop to eye daily as needed., Disp: , Rfl:    Cholecalciferol (VITAMIN D3) 1000 units CAPS, Take 1 capsule by mouth daily., Disp: , Rfl:    clotrimazole (LOTRIMIN) 1 % cream, Apply 1 application topically daily as needed., Disp: , Rfl:    cyclobenzaprine (FLEXERIL) 5 MG tablet, Take 5 mg by mouth 3 (three) times daily as needed. , Disp: , Rfl:    dabigatran (PRADAXA) 150 MG CAPS capsule, Take 150 mg by mouth 2 (two) times daily., Disp: , Rfl:    diltiazem (CARDIZEM CD) 240 MG 24 hr capsule, Take by mouth. Take 1 capsule by mouth once daily, Disp: , Rfl:    DULoxetine (CYMBALTA) 30 MG capsule, Take 1 capsule (30 mg total) by mouth daily. To be combined with 60 mg, Disp: 90 capsule, Rfl: 0   DULoxetine (CYMBALTA) 60 MG capsule, Take 1 capsule (60 mg total) by mouth  daily. To be combined with 30 mg, Disp: 90 capsule, Rfl: 0   esomeprazole (NEXIUM) 40 MG capsule, Take 40 mg by mouth daily., Disp: , Rfl:    famotidine (PEPCID) 40 MG tablet, Take 40 mg by mouth 2 (two) times daily., Disp: , Rfl:    ferrous sulfate 325 (65 FE) MG tablet, Take by mouth. Take 325 mg daily with breakfast, Disp: , Rfl:    fluticasone (FLONASE) 50 MCG/ACT nasal spray, Place 2 sprays into both nostrils as needed. , Disp: , Rfl:    furosemide (LASIX) 20 MG tablet, Take 20 mg by mouth daily., Disp: , Rfl:    gabapentin (NEURONTIN) 600 MG tablet, Take 1 tablet (600 mg total) by mouth 2 (two) times daily., Disp: 180 tablet, Rfl: 0   hydrOXYzine (VISTARIL) 25 MG capsule, TAKE (1) CAPSULE BY MOUTH TWICE DAILY ASNEEDED FOR SEVERE ANXIETY ATTACKS., Disp: 180 capsule, Rfl: 0   Insulin Degludec (TRESIBA FLEXTOUCH) 200 UNIT/ML SOPN, Inject 200 Units into the skin daily., Disp: , Rfl:    insulin lispro (HUMALOG) 100 UNIT/ML KwikPen, Inject up to 50 units daily in divided doses, as directed, Disp: , Rfl:    Insulin Pen Needle (BD ULTRA-FINE PEN NEEDLES) 29G X 12.7MM MISC, Use 3 times daily as directed, Disp: , Rfl:    lamoTRIgine (LAMICTAL) 100 MG tablet, Take 100 mg by mouth daily., Disp: , Rfl:    levothyroxine (SYNTHROID) 112 MCG tablet, Take 112 mcg by mouth daily., Disp: , Rfl:    levothyroxine (SYNTHROID, LEVOTHROID) 100 MCG tablet, Take 100 mcg by mouth daily. (Patient not taking: Reported on 12/08/2020), Disp: , Rfl:    losartan (COZAAR) 25 MG tablet, Take 12.5 mg by mouth daily., Disp: , Rfl:    losartan (COZAAR) 25 MG tablet, Take by mouth. (Patient not taking: Reported on 12/08/2020), Disp: , Rfl:    Magnesium 250 MG TABS, Take 1 tablet by mouth daily., Disp: , Rfl:    magnesium oxide (MAG-OX) 400 MG tablet, Take 1 tablet by mouth daily., Disp: , Rfl:  Melatonin 10 MG TABS, Take 2 tablets by mouth at bedtime., Disp: , Rfl:    meloxicam (MOBIC) 15 MG tablet, Take 15 mg by mouth daily.,  Disp: , Rfl:    metFORMIN (GLUCOPHAGE-XR) 500 MG 24 hr tablet, Take 500 mg by mouth 2 (two) times daily. , Disp: , Rfl:    metoprolol succinate (TOPROL-XL) 25 MG 24 hr tablet, Take 1 tablet by mouth daily., Disp: , Rfl:    naloxone (NARCAN) nasal spray 4 mg/0.1 mL, 1 spray into nose for opioid overdose. If needed, may repeat spray in 2-3 min, Disp: , Rfl:    NUCYNTA ER 100 MG 12 hr tablet, Take 1 tablet by mouth 2 (two) times daily., Disp: , Rfl:    nystatin (MYCOSTATIN) 100000 UNIT/ML suspension, Take 5 mLs by mouth 4 (four) times daily., Disp: , Rfl:    omeprazole (PRILOSEC) 20 MG capsule, TAKE (1) CAPSULE BY MOUTH EVERY DAY, Disp: 30 capsule, Rfl: 0   ondansetron (ZOFRAN) 4 MG tablet, Take 4 mg by mouth at bedtime., Disp: , Rfl:    oxyCODONE (OXY IR/ROXICODONE) 5 MG immediate release tablet, Take 5 mg by mouth 2 (two) times daily as needed., Disp: , Rfl:    pioglitazone (ACTOS) 15 MG tablet, Take by mouth. Take 1 tablet by mouth once daily, Disp: , Rfl:    quinapril (ACCUPRIL) 10 MG tablet, Take 10 mg by mouth daily., Disp: , Rfl:    Riboflavin 400 MG TABS, Take 1 tablet by mouth every night  for headaches, Disp: , Rfl:    Rimegepant Sulfate (NURTEC) 75 MG TBDP, Take 1 tablet by mouth as needed. , Disp: , Rfl:    traZODone (DESYREL) 100 MG tablet, TAKE 1.5-2 TABLETS BY MOUTH AT BEDTIME AS NEEDED FOR SLEEP, Disp: 180 tablet, Rfl: 0   TRESIBA FLEXTOUCH 200 UNIT/ML SOPN, Inject 76 Units into the skin at bedtime., Disp: , Rfl:   Physical exam:  Vitals:   12/17/20 1307  BP: 112/66  Pulse: 78  Resp: 18  Temp: (!) 97.2 F (36.2 C)  SpO2: 96%  Weight: 254 lb 13.6 oz (115.6 kg)   Physical Exam HENT:     Head: Normocephalic and atraumatic.  Eyes:     Pupils: Pupils are equal, round, and reactive to light.  Cardiovascular:     Rate and Rhythm: Normal rate and regular rhythm.     Heart sounds: Normal heart sounds.  Pulmonary:     Effort: Pulmonary effort is normal.     Breath sounds:  Normal breath sounds.  Abdominal:     General: Bowel sounds are normal.     Palpations: Abdomen is soft.  Musculoskeletal:     Cervical back: Normal range of motion.  Skin:    General: Skin is warm and dry.  Neurological:     Mental Status: She is alert and oriented to person, place, and time.     CMP Latest Ref Rng & Units 10/21/2020  Glucose 65 - 99 mg/dL 233(H)  BUN 6 - 24 mg/dL 7  Creatinine 0.57 - 1.00 mg/dL 0.99  Sodium 134 - 144 mmol/L 137  Potassium 3.5 - 5.2 mmol/L 4.1  Chloride 96 - 106 mmol/L 97  CO2 20 - 29 mmol/L 21  Calcium 8.7 - 10.2 mg/dL 8.9  Total Protein 6.0 - 8.5 g/dL 7.3  Total Bilirubin 0.0 - 1.2 mg/dL 0.3  Alkaline Phos 44 - 121 IU/L 119  AST 0 - 40 IU/L 31  ALT 0 - 32 IU/L  34(H)   CBC Latest Ref Rng & Units 12/16/2020  WBC 4.0 - 10.5 K/uL 5.3  Hemoglobin 12.0 - 15.0 g/dL 12.8  Hematocrit 36.0 - 46.0 % 39.0  Platelets 150 - 400 K/uL 186     Assessment and plan- Patient is a 55 y.o. female with history of iron deficiency anemia here for a routine follow-up  Iron deficiency anemia: Most recent hemoglobin was 12.8With a hematocrit of 39.  Ferritin levels are normal at 41.  She does not require any IV iron at this time.  She last required IV iron in February 2022.  Repeat CBC ferritin and iron studies in 3 in 6 months and I will see her back in 6 months.  We will also check B12 level in 3 months   Visit Diagnosis 1. Iron deficiency anemia, unspecified iron deficiency anemia type      Dr. Randa Evens, MD, MPH [facility] at [facility] [phone] 12/17/2020 1:31 PM

## 2020-12-23 ENCOUNTER — Telehealth (HOSPITAL_COMMUNITY): Payer: Self-pay

## 2020-12-23 DIAGNOSIS — F3342 Major depressive disorder, recurrent, in full remission: Secondary | ICD-10-CM

## 2020-12-23 DIAGNOSIS — F419 Anxiety disorder, unspecified: Secondary | ICD-10-CM

## 2020-12-23 MED ORDER — DULOXETINE HCL 60 MG PO CPEP: 60.0000 mg | ORAL_CAPSULE | Freq: Two times a day (BID) | ORAL | 1 refills | Status: DC

## 2020-12-23 NOTE — Telephone Encounter (Signed)
pt called states she is nervious about having a nerve stimulation implant surgery done and she wanted to know if you can send her something in for her stressing out of the surgery

## 2020-12-23 NOTE — Telephone Encounter (Signed)
Returned call to patient.  She reports she is going to have a procedure done and feels anxious.  Discussed increasing Cymbalta to 60 mg twice a day.  Also advised her to take an extra dosage of hydroxyzine 25 mg as needed.

## 2021-01-01 ENCOUNTER — Telehealth (INDEPENDENT_AMBULATORY_CARE_PROVIDER_SITE_OTHER): Payer: Medicare Other | Admitting: Psychiatry

## 2021-01-01 ENCOUNTER — Encounter: Payer: Self-pay | Admitting: Psychiatry

## 2021-01-01 ENCOUNTER — Other Ambulatory Visit: Payer: Self-pay

## 2021-01-01 DIAGNOSIS — Z634 Disappearance and death of family member: Secondary | ICD-10-CM

## 2021-01-01 DIAGNOSIS — F411 Generalized anxiety disorder: Secondary | ICD-10-CM

## 2021-01-01 DIAGNOSIS — F331 Major depressive disorder, recurrent, moderate: Secondary | ICD-10-CM

## 2021-01-01 DIAGNOSIS — G4701 Insomnia due to medical condition: Secondary | ICD-10-CM

## 2021-01-01 DIAGNOSIS — F4310 Post-traumatic stress disorder, unspecified: Secondary | ICD-10-CM
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
## 2021-01-06 ENCOUNTER — Ambulatory Visit: Payer: Medicare Other | Admitting: Gastroenterology

## 2021-01-06 ENCOUNTER — Encounter: Payer: Self-pay | Admitting: *Deleted

## 2021-01-07 ENCOUNTER — Telehealth: Payer: Self-pay

## 2021-01-07 ENCOUNTER — Other Ambulatory Visit: Payer: Self-pay | Admitting: Psychiatry

## 2021-01-07 DIAGNOSIS — F411 Generalized anxiety disorder: Secondary | ICD-10-CM

## 2021-01-07 MED ORDER — HYDROXYZINE PAMOATE 25 MG PO CAPS: 25.0000 mg | ORAL_CAPSULE | Freq: Two times a day (BID) | ORAL | 0 refills | Status: DC | PRN

## 2021-01-07 NOTE — Telephone Encounter (Signed)
pt called left message that she needs refills on the hydroxyzine

## 2021-01-07 NOTE — Telephone Encounter (Signed)
ORdered.

## 2021-01-19 ENCOUNTER — Other Ambulatory Visit: Payer: Self-pay

## 2021-01-19 ENCOUNTER — Ambulatory Visit (INDEPENDENT_AMBULATORY_CARE_PROVIDER_SITE_OTHER): Payer: Medicare Other | Admitting: Licensed Clinical Social Worker

## 2021-01-19 DIAGNOSIS — F4310 Post-traumatic stress disorder, unspecified: Secondary | ICD-10-CM | POA: Diagnosis not present

## 2021-01-19 NOTE — Progress Notes (Signed)
Virtual Visit via Video Note  I connected with Christine Mcneil on 01/19/21 at  4:00 PM EDT by a video enabled telemedicine application and verified that I am speaking with the correct person using two identifiers.  Location: Patient: home Provider: remote office [place], [place])   I discussed the limitations of evaluation and management by telemedicine and the availability of in person appointments. The patient expressed understanding and agreed to proceed.  I discussed the assessment and treatment plan with the patient. The patient was provided an opportunity to ask questions and all were answered. The patient agreed with the plan and demonstrated an understanding of the instructions.   The patient was advised to call back or seek an in-person evaluation if the symptoms worsen or if the condition fails to improve as anticipated.  I provided 40 minutes of non-face-to-face time during this encounter.   Darianny Momon R Shonte Soderlund, LCSW  THERAPIST PROGRESS NOTE  Session Time: 4-4:40p  Participation Level: Active  Behavioral Response: NAAlertAnxious and Depressed  Type of Therapy: Individual Therapy  Treatment Goals addressed: Anxiety and Coping  Interventions: CBT and Supportive  Summary: Christine Mcneil is a 55 y.o. female who presents with continuing symptoms related to depression diagnosis. Patient reports that overall mood has been stable recently, and that she feels that she is managing stress and anxiety symptoms well. Patient reports that recent stress has been triggered by ongoing caregiver related stress. 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 Patient reports that she has a sister that could potentially share responsibility, but her sister does not help with her mother. Patient reports that she finds this frustrating because most of the duties involving her mother's care fall to her. Patient reports that she has a boyfriend  that is extremely helpful. Patient reports that she feels her family makes the assumption that because she is not currently working outside the home that means that she is available to help her mother. Allow patient safe space to express thoughts and feelings associated with these recent external stressors and life events. Reviewed healthy coping mechanisms for stress, anxiety, and depression. Reviewed sleep hygiene. Continued recommendations are as follows: self care behaviors, positive social engagements, focusing on overall work/home/life balance, and focusing on positive physical and emotional wellness.  .   Suicidal/Homicidal: No  Therapist Response: Initial session with pt--reviewed and updated CCA and reviewed Tx plan.   Plan: Return again in 4 weeks.  Diagnosis: Axis I: Post Traumatic Stress Disorder    Axis II: No diagnosis    [place], LCSW 01/19/2021

## 2021-01-22 ENCOUNTER — Telehealth (INDEPENDENT_AMBULATORY_CARE_PROVIDER_SITE_OTHER): Payer: Medicare Other | Admitting: Psychiatry

## 2021-01-22 ENCOUNTER — Other Ambulatory Visit: Payer: Self-pay

## 2021-01-22 ENCOUNTER — Encounter: Payer: Self-pay | Admitting: Psychiatry

## 2021-01-22 DIAGNOSIS — G4701 Insomnia due to medical condition: Secondary | ICD-10-CM | POA: Diagnosis not present

## 2021-01-22 DIAGNOSIS — Z634 Disappearance and death of family member: Secondary | ICD-10-CM

## 2021-01-22 DIAGNOSIS — F3342 Major depressive disorder, recurrent, in full remission: Secondary | ICD-10-CM | POA: Diagnosis not present

## 2021-01-22 DIAGNOSIS — F411 Generalized anxiety disorder: Secondary | ICD-10-CM | POA: Diagnosis not present

## 2021-01-22 DIAGNOSIS — F4310 Post-traumatic stress disorder, unspecified: Secondary | ICD-10-CM

## 2021-01-22 NOTE — Progress Notes (Signed)
Virtual Visit via Video Note  I connected with Christine Mcneil on 01/22/21 at  1:40 PM EDT by a video enabled telemedicine application and verified that I am speaking with the correct person using two identifiers.    I discussed the limitations of evaluation and management by telemedicine and the availability of in person appointments. The patient expressed understanding and agreed to proceed. Location Provider Location : [facility] Patient Location : Home  Participants: Patient , Provider    I discussed the assessment and treatment plan with the patient. The patient was provided an opportunity to ask questions and all were answered. The patient agreed with the plan and demonstrated an understanding of the instructions.   The patient was advised to call back or seek an in-person evaluation if the symptoms worsen or if the condition fails to improve as anticipated.   [place] MD OP Progress Note  01/22/2021 1:58 PM Christine Mcneil  MRN:  [id number]  Chief Complaint:  Chief Complaint   Follow-up; Anxiety; Depression    HPI: Christine Mcneil is a 56 year old Caucasian female, divorced, lives in [place], has a history of MDD, PTSD, insomnia, OSA-resolved, history of CVA, history of prothrombin gene mutation, chronic pain, migraine headaches, iron deficiency was evaluated by telemedicine today.  Patient today reports she is tolerating the spinal cord stimulator well, had spinal cord stimulator placement surgery on 12/31/2020.  Currently rates her back pain at a 0 out of 10, 10 being the worst.  She reports she has been able to get out more and has been doing a lot more activities, recently went out for fishing and that was a huge step for her.She currently does have a headache, does have a history of migraine headaches.  She currently rates her headache at a 6 out of 10, 10 being the worst.  She agrees to get in touch with her provider if her headache does not get better.  Patient denies any significant depression  at this time.  She reports anxiety symptoms is improving.  She is tolerating the medications well, reports she is compliant on them.  Patient continues to be in psychotherapy sessions.  Patient denies any suicidality, homicidality or perceptual disturbances.  Patient denies any other concerns today.    Visit Diagnosis:    ICD-10-CM   1. MDD (major depressive disorder), recurrent, in full remission ([place])  F33.42     2. PTSD (post-traumatic stress disorder)  F43.10     3. GAD (generalized anxiety disorder)  F41.1     4. Insomnia due to medical condition  G47.01    pain, mood    5. Bereavement  Z63.4       Past Psychiatric History: Reviewed past psychiatric history from progress note on 08/16/2017.  Past trials of Effexor, Klonopin  Past Medical History:  Past Medical History:  Diagnosis Date   Abdominal pain    Anxiety and depression    Atypical facial pain    Bilateral occipital neuralgia    Cervical spondylosis without myelopathy    Chronic daily headache    Chronic pain    Chronic pelvic pain in female    Chronic thoracic back pain    Depression    Diabetes mellitus without complication (HCC)    Diabetes mellitus, type II (HCC)    Dysphagia    Dysrhythmia    Fibromyalgia    Hyperlipidemia    Hypertension    Hypothyroidism    Insomnia    Iron deficiency anemia  Left hip pain    Low back pain    Migraines    Numbness    Optic neuropathy    OSA (obstructive sleep apnea)    Polyarthralgia    Primary osteoarthritis of both hips    Prothrombin gene mutation (HCC)    Pseudotumor cerebri    Rectal bleeding    Right shoulder pain    Rotator cuff syndrome    Stroke [facility])    Thyroid disease    TIA (transient ischemic attack) 05/27/2017    Past Surgical History:  Procedure Laterality Date   ABDOMINAL HYSTERECTOMY     total   CHOLECYSTECTOMY     COLONOSCOPY WITH PROPOFOL N/A 03/21/2018   Procedure: COLONOSCOPY WITH PROPOFOL;  Surgeon: Lucilla Lame, MD;   Location: [facility] ENDOSCOPY;  Service: Endoscopy;  Laterality: N/A;   ERCP N/A 08/19/2020   Procedure: ENDOSCOPIC RETROGRADE CHOLANGIOPANCREATOGRAPHY (ERCP);  Surgeon: Lucilla Lame, MD;  Location: [facility] ENDOSCOPY;  Service: Endoscopy;  Laterality: N/A;   ESOPHAGOGASTRODUODENOSCOPY (EGD) WITH PROPOFOL N/A 03/21/2018   Procedure: ESOPHAGOGASTRODUODENOSCOPY (EGD) WITH PROPOFOL;  Surgeon: Lucilla Lame, MD;  Location: [facility] ENDOSCOPY;  Service: Endoscopy;  Laterality: N/A;   GIVENS CAPSULE STUDY N/A 09/24/2019   Procedure: GIVENS CAPSULE STUDY;  Surgeon: Virgel Manifold, MD;  Location: [facility] ENDOSCOPY;  Service: Endoscopy;  Laterality: N/A;   JOINT REPLACEMENT     KNEE SURGERY Left    ROTATOR CUFF REPAIR Right    spinal neurostimulator      Family Psychiatric History: Manage family psychiatric history from progress note on 08/16/2017  Family History:  Family History  Problem Relation Age of Onset   Diabetes Mother    Hyperlipidemia Mother    Hypertension Mother    COPD Mother    CVA Mother    Anemia Mother    Diabetes Father    Hyperlipidemia Father    Hypertension Father    Thyroid disease Father    Alcohol abuse Father    Peripheral vascular disease Sister    Hypertension Sister    Anxiety disorder Son    Depression Son    Bipolar disorder Son    Seizures Son     Social History: Reviewed social history from progress note on 08/16/2017 Social History   Socioeconomic History   Marital status: Divorced    Spouse name: Not on file   Number of children: 1   Years of education: Not on file   Highest education level: High school graduate  Occupational History    Comment: disablitiy  Tobacco Use   Smoking status: Never   Smokeless tobacco: Never  Vaping Use   Vaping Use: Never used  Substance and Sexual Activity   Alcohol use: No   Drug use: No   Sexual activity: Not Currently  Other Topics Concern   Not on file  Social History Narrative   Not on file   Social  Determinants of Health   Financial Resource Strain: Not on file  Food Insecurity: Not on file  Transportation Needs: Not on file  Physical Activity: Not on file  Stress: Not on file  Social Connections: Not on file    Allergies:  Allergies  Allergen Reactions   Amoxapine Itching   Amoxicillin Itching   Dapagliflozin Itching    Other reaction(s): Other (See Comments) Thrush & vaginal itching   Keflex [Cephalexin] Itching   Mirtazapine Other (See Comments)    Pt states felling "like my body is outside of me."    Metabolic Disorder Labs:  Lab Results  Component Value Date   HGBA1C 9.9 (H) 06/21/2017   MPG 237.43 06/21/2017   Lab Results  Component Value Date   PROLACTIN 5.5 09/06/2017   Lab Results  Component Value Date   CHOL 277 (H) 06/21/2017   TRIG 196 (H) 06/21/2017   HDL 64 06/21/2017   CHOLHDL 4.3 06/21/2017   VLDL 39 06/21/2017   LDLCALC 174 (H) 06/21/2017   Lab Results  Component Value Date   TSH 2.890 09/06/2017   TSH 3.176 01/04/2017    Therapeutic Level Labs: No results found for: LITHIUM No results found for: VALPROATE No components found for:  CBMZ  Current Medications: Current Outpatient Medications  Medication Sig Dispense Refill   cyclobenzaprine (FLEXERIL) 5 MG tablet Take 1 tablet by mouth 3 (three) times daily as needed.     naloxone (NARCAN) nasal spray 4 mg/0.1 mL Place into the nose.     ACCU-CHEK AVIVA PLUS test strip      ACCU-CHEK SOFTCLIX LANCETS lancets  (Patient not taking: Reported on 12/08/2020)     Acetaminophen (TYLENOL ARTHRITIS EXT RELIEF PO) Take 600 mg by mouth daily.     acetaZOLAMIDE (DIAMOX) 250 MG tablet  (Patient not taking: Reported on 12/08/2020)     acetaZOLAMIDE (DIAMOX) 250 MG tablet Take by mouth.     AIMOVIG 70 MG/ML SOAJ Inject 70 mg into the skin every 28 (twenty-eight) days.     albuterol (VENTOLIN HFA) 108 (90 Base) MCG/ACT inhaler Inhale 2 puffs into the lungs every 6 (six) hours as needed for wheezing or  shortness of breath. 1 g 0   ARIPiprazole (ABILIFY) 5 MG tablet Take 1 tablet (5 mg total) by mouth daily. 90 tablet 0   aspirin-acetaminophen-caffeine (EXCEDRIN MIGRAINE) 250-250-65 MG tablet Take 2 tablets by mouth every 8 (eight) hours as needed for headache or migraine.      atorvastatin (LIPITOR) 80 MG tablet Take 80 mg by mouth daily at 6 PM.     Blood Glucose Monitoring Suppl (ACCU-CHEK AVIVA PLUS) w/Device KIT      Carboxymethylcellulose Sod PF 0.5 % SOLN Apply 1 drop to eye daily as needed.     Cholecalciferol (VITAMIN D3) 1000 units CAPS Take 1 capsule by mouth daily.     clotrimazole (LOTRIMIN) 1 % cream Apply 1 application topically daily as needed.     cyclobenzaprine (FLEXERIL) 5 MG tablet Take 5 mg by mouth 3 (three) times daily as needed.      dabigatran (PRADAXA) 150 MG CAPS capsule Take 150 mg by mouth 2 (two) times daily.     diltiazem (CARDIZEM CD) 240 MG 24 hr capsule Take by mouth. Take 1 capsule by mouth once daily     DULoxetine (CYMBALTA) 60 MG capsule Take 1 capsule (60 mg total) by mouth 2 (two) times daily. 60 capsule 1   esomeprazole (NEXIUM) 40 MG capsule Take 40 mg by mouth daily.     famotidine (PEPCID) 40 MG tablet Take 40 mg by mouth 2 (two) times daily.     ferrous sulfate 325 (65 FE) MG tablet Take by mouth. Take 325 mg daily with breakfast     fluticasone (FLONASE) 50 MCG/ACT nasal spray Place 2 sprays into both nostrils as needed.      furosemide (LASIX) 20 MG tablet Take 20 mg by mouth daily.     gabapentin (NEURONTIN) 600 MG tablet Take 1 tablet (600 mg total) by mouth 2 (two) times daily. 180 tablet 0   hydrOXYzine (VISTARIL)  25 MG capsule Take 1 capsule (25 mg total) by mouth 2 (two) times daily as needed for anxiety. 180 capsule 0   Insulin Degludec (TRESIBA FLEXTOUCH) 200 UNIT/ML SOPN Inject 200 Units into the skin daily.     insulin lispro (HUMALOG) 100 UNIT/ML KwikPen Inject up to 50 units daily in divided doses, as directed     Insulin Pen Needle  (BD ULTRA-FINE PEN NEEDLES) 29G X 12.7MM MISC Use 3 times daily as directed     lamoTRIgine (LAMICTAL) 100 MG tablet Take 100 mg by mouth daily. (Patient not taking: Reported on 12/17/2020)     lamoTRIgine (LAMICTAL) 150 MG tablet Take 150 mg by mouth daily.     levothyroxine (SYNTHROID) 112 MCG tablet Take 112 mcg by mouth daily.     levothyroxine (SYNTHROID, LEVOTHROID) 100 MCG tablet Take 100 mcg by mouth daily. (Patient not taking: No sig reported)     losartan (COZAAR) 25 MG tablet Take 12.5 mg by mouth daily.     losartan (COZAAR) 25 MG tablet Take by mouth. (Patient not taking: No sig reported)     Magnesium 250 MG TABS Take 1 tablet by mouth daily.     magnesium oxide (MAG-OX) 400 MG tablet Take 1 tablet by mouth daily.     Melatonin 10 MG TABS Take 2 tablets by mouth at bedtime.     meloxicam (MOBIC) 15 MG tablet Take 15 mg by mouth daily.     metFORMIN (GLUCOPHAGE-XR) 500 MG 24 hr tablet Take 500 mg by mouth 2 (two) times daily.      metoprolol succinate (TOPROL-XL) 25 MG 24 hr tablet Take 1 tablet by mouth daily.     naloxone (NARCAN) nasal spray 4 mg/0.1 mL 1 spray into nose for opioid overdose. If needed, may repeat spray in 2-3 min     NUCYNTA ER 100 MG 12 hr tablet Take 1 tablet by mouth 2 (two) times daily.     nystatin (MYCOSTATIN) 100000 UNIT/ML suspension Take 5 mLs by mouth 4 (four) times daily.     omeprazole (PRILOSEC) 20 MG capsule TAKE (1) CAPSULE BY MOUTH EVERY DAY 30 capsule 0   ondansetron (ZOFRAN) 4 MG tablet Take 4 mg by mouth at bedtime.     oxyCODONE (OXY IR/ROXICODONE) 5 MG immediate release tablet Take 5 mg by mouth 2 (two) times daily as needed.     pioglitazone (ACTOS) 15 MG tablet Take by mouth. Take 1 tablet by mouth once daily     quinapril (ACCUPRIL) 10 MG tablet Take 10 mg by mouth daily.     Riboflavin 400 MG TABS Take 1 tablet by mouth every night  for headaches     Rimegepant Sulfate (NURTEC) 75 MG TBDP Take 1 tablet by mouth as needed.      traZODone  (DESYREL) 100 MG tablet TAKE 1.5-2 TABLETS BY MOUTH AT BEDTIME AS NEEDED FOR SLEEP 180 tablet 0   TRESIBA FLEXTOUCH 200 UNIT/ML SOPN Inject 76 Units into the skin at bedtime.     No current facility-administered medications for this visit.     Musculoskeletal: Strength & Muscle Tone:  UTA Gait & Station: normal Patient leans: N/A  Psychiatric Specialty Exam: Review of Systems  Neurological:  Positive for headaches.  Psychiatric/Behavioral:  Negative for agitation and behavioral problems. The patient is nervous/anxious.   All other systems reviewed and are negative.  There were no vitals taken for this visit.There is no height or weight on file to calculate BMI.  General  Appearance: Casual  Eye Contact:  Good  Speech:  Clear and Coherent  Volume:  Normal  Mood:  Anxious Coping better  Affect:  Congruent  Thought Process:  Goal Directed and Descriptions of Associations: Intact  Orientation:  Full (Time, Place, and Person)  Thought Content: Logical   Suicidal Thoughts:  No  Homicidal Thoughts:  No  Memory:  Immediate;   Fair Recent;   Fair Remote;   Fair  Judgement:  Fair  Insight:  Fair  Psychomotor Activity:  Normal  Concentration:  Concentration: Fair and Attention Span: Fair  Recall:  [employer] of Knowledge: Fair  Language: Fair  Akathisia:  No  Handed:  Right  AIMS (if indicated): done  Assets:  Communication Skills Desire for Improvement Housing Social Support  ADL's:  Intact  Cognition: WNL  Sleep:  Fair   Screenings: GAD-7    Flowsheet Row Video Visit from 01/22/2021 in [place] from 01/19/2021 in [place]  Total GAD-7 Score 2 21      PHQ2-9    [place] Video Visit from 01/22/2021 in [place] from 01/19/2021 in [place] Video Visit from 12/08/2020 in [place] Video Visit from  08/04/2020 in [place] Video Visit from 07/07/2020 in [place]  PHQ-2 Total Score _0 PHQ-9 Total Score _1 -- --      Flowsheet Row Video Visit from 01/22/2021 in [place] from 01/19/2021 in [place] Video Visit from 12/08/2020 in [place] Low Risk No Risk Low Risk        Assessment and Plan: Christine Mcneil is a 55 year old Caucasian female who has a history of MDD, PTSD, chronic pain, migraine headache, history of CVA, iron deficiency anemia, prothrombin gene mutation was evaluated by telemedicine today.  Patient is currently improving, will benefit from the following plan.  Plan MDD in remission Cymbalta 120 mg p.o. daily in divided dosage Lamictal 125 mg p.o. daily Abilify 5 mg p.o. daily AIMS - 0   GAD-improving Cymbalta as prescribed Hydroxyzine 25 mg p.o. daily as needed for severe anxiety attacks  Insomnia-improving Trazodone 150-200 mg p.o. nightly Melatonin 3 to 9 mg p.o. nightly Hydroxyzine 25 mg as needed for sleep.  Bereavement-improving Continue CBT.  Patient to reach out to her provider for her migraine headache.  Follow-up in clinic in 1 month or sooner if needed.  This note was generated in part or whole with voice recognition software. Voice recognition is usually quite accurate but there are transcription errors that can and very often do occur. I apologize for any typographical errors that were not detected and corrected.      Ursula Alert, MD 01/22/2021, 1:58 PM

## 2021-02-11 ENCOUNTER — Other Ambulatory Visit: Payer: Self-pay | Admitting: Psychiatry

## 2021-02-11 DIAGNOSIS — F4310 Post-traumatic stress disorder, unspecified: Secondary | ICD-10-CM

## 2021-02-11 DIAGNOSIS — F411 Generalized anxiety disorder: Secondary | ICD-10-CM

## 2021-02-19 ENCOUNTER — Ambulatory Visit: Payer: Medicare Other | Admitting: Licensed Clinical Social Worker

## 2021-02-23 ENCOUNTER — Other Ambulatory Visit: Payer: Self-pay

## 2021-02-23 ENCOUNTER — Encounter: Payer: Self-pay | Admitting: Psychiatry

## 2021-02-23 ENCOUNTER — Telehealth (INDEPENDENT_AMBULATORY_CARE_PROVIDER_SITE_OTHER): Payer: Medicare Other | Admitting: Psychiatry

## 2021-02-23 DIAGNOSIS — F411 Generalized anxiety disorder: Secondary | ICD-10-CM | POA: Diagnosis not present

## 2021-02-23 DIAGNOSIS — F3342 Major depressive disorder, recurrent, in full remission: Secondary | ICD-10-CM | POA: Diagnosis not present

## 2021-02-23 DIAGNOSIS — G4701 Insomnia due to medical condition: Secondary | ICD-10-CM

## 2021-02-23 DIAGNOSIS — F4310 Post-traumatic stress disorder, unspecified: Secondary | ICD-10-CM

## 2021-02-23 DIAGNOSIS — Z634 Disappearance and death of family member: Secondary | ICD-10-CM

## 2021-02-23 MED ORDER — TRAZODONE HCL 100 MG PO TABS: ORAL_TABLET | ORAL | 0 refills | Status: DC

## 2021-02-23 MED ORDER — DULOXETINE HCL 60 MG PO CPEP: 60.0000 mg | ORAL_CAPSULE | Freq: Two times a day (BID) | ORAL | 0 refills | Status: DC

## 2021-02-23 MED ORDER — ARIPIPRAZOLE 5 MG PO TABS: 5.0000 mg | ORAL_TABLET | Freq: Every day | ORAL | 0 refills | Status: DC

## 2021-02-23 NOTE — Progress Notes (Signed)
Virtual Visit via Video Note  I connected with Christine Mcneil on 02/23/21 at  4:00 PM EDT by a video enabled telemedicine application and verified that I am speaking with the correct person using two identifiers.  Location Provider Location : [facility] Patient Location : Home  Participants: Patient , Provider   I discussed the limitations of evaluation and management by telemedicine and the availability of in person appointments. The patient expressed understanding and agreed to proceed.    I discussed the assessment and treatment plan with the patient. The patient was provided an opportunity to ask questions and all were answered. The patient agreed with the plan and demonstrated an understanding of the instructions.   The patient was advised to call back or seek an in-person evaluation if the symptoms worsen or if the condition fails to improve as anticipated.   [facility] MD OP Progress Note  02/23/2021 4:21 PM Christine Mcneil  MRN:  [id number]  Chief Complaint:  Chief Complaint   Follow-up; Anxiety; Depression    HPI: Christine Mcneil is a 55 year old Caucasian female, divorced, lives in [place], has a history of MDD, PTSD, insomnia, OSA-resolved, history of CVA, history of prothrombin gene mutation, chronic pain, migraine headache, iron deficiency was evaluated by telemedicine today.  Patient today reports mood wise she is doing well.  Denies any significant mood swings, sadness.  Reports anxiety symptoms are under control.  Reports she is sleeping better.  Patient is compliant on medications.  Denies side effects.  Patient denies any suicidality, homicidality or perceptual disturbances.  She reports her pain is more under control and she is currently trying to be off of the pain medication.  Patient reports grief is improved.  Patient denies any other concerns today.  Visit Diagnosis:    ICD-10-CM   1. MDD (major depressive disorder), recurrent, in full remission ([place])  F33.42  ARIPiprazole (ABILIFY) 5 MG tablet    2. PTSD (post-traumatic stress disorder)  F43.10 traZODone (DESYREL) 100 MG tablet    3. GAD (generalized anxiety disorder)  F41.1     4. Insomnia due to medical condition  G47.01    pain, mood     5. Bereavement  Z63.4 DULoxetine (CYMBALTA) 60 MG capsule      Past Psychiatric History: Reviewed past psychiatric history from progress note on 08/16/2017.  Past trials of Effexor, Klonopin  Past Medical History:  Past Medical History:  Diagnosis Date   Abdominal pain    Anxiety and depression    Atypical facial pain    Bilateral occipital neuralgia    Cervical spondylosis without myelopathy    Chronic daily headache    Chronic pain    Chronic pelvic pain in female    Chronic thoracic back pain    Depression    Diabetes mellitus without complication (HCC)    Diabetes mellitus, type II (HCC)    Dysphagia    Dysrhythmia    Fibromyalgia    Hyperlipidemia    Hypertension    Hypothyroidism    Insomnia    Iron deficiency anemia    Left hip pain    Low back pain    Migraines    Numbness    Optic neuropathy    OSA (obstructive sleep apnea)    Polyarthralgia    Primary osteoarthritis of both hips    Prothrombin gene mutation (HCC)    Pseudotumor cerebri    Rectal bleeding    Right shoulder pain    Rotator cuff syndrome  Stroke [facility])    Thyroid disease    TIA (transient ischemic attack) 05/27/2017    Past Surgical History:  Procedure Laterality Date   ABDOMINAL HYSTERECTOMY     total   CHOLECYSTECTOMY     COLONOSCOPY WITH PROPOFOL N/A 03/21/2018   Procedure: COLONOSCOPY WITH PROPOFOL;  Surgeon: Lucilla Lame, MD;  Location: [facility] ENDOSCOPY;  Service: Endoscopy;  Laterality: N/A;   ERCP N/A 08/19/2020   Procedure: ENDOSCOPIC RETROGRADE CHOLANGIOPANCREATOGRAPHY (ERCP);  Surgeon: Lucilla Lame, MD;  Location: [facility] ENDOSCOPY;  Service: Endoscopy;  Laterality: N/A;   ESOPHAGOGASTRODUODENOSCOPY (EGD) WITH PROPOFOL N/A 03/21/2018    Procedure: ESOPHAGOGASTRODUODENOSCOPY (EGD) WITH PROPOFOL;  Surgeon: Lucilla Lame, MD;  Location: [facility] ENDOSCOPY;  Service: Endoscopy;  Laterality: N/A;   GIVENS CAPSULE STUDY N/A 09/24/2019   Procedure: GIVENS CAPSULE STUDY;  Surgeon: Virgel Manifold, MD;  Location: [facility] ENDOSCOPY;  Service: Endoscopy;  Laterality: N/A;   JOINT REPLACEMENT     KNEE SURGERY Left    ROTATOR CUFF REPAIR Right    spinal neurostimulator      Family Psychiatric History: Reviewed family psychiatric history from progress note on 08/16/2017  Family History:  Family History  Problem Relation Age of Onset   Diabetes Mother    Hyperlipidemia Mother    Hypertension Mother    COPD Mother    CVA Mother    Anemia Mother    Diabetes Father    Hyperlipidemia Father    Hypertension Father    Thyroid disease Father    Alcohol abuse Father    Peripheral vascular disease Sister    Hypertension Sister    Anxiety disorder Son    Depression Son    Bipolar disorder Son    Seizures Son     Social History: Reviewed social history from progress note on 08/16/2017 Social History   Socioeconomic History   Marital status: Divorced    Spouse name: Not on file   Number of children: 1   Years of education: Not on file   Highest education level: High school graduate  Occupational History    Comment: disablitiy  Tobacco Use   Smoking status: Never   Smokeless tobacco: Never  Vaping Use   Vaping Use: Never used  Substance and Sexual Activity   Alcohol use: No   Drug use: No   Sexual activity: Not Currently  Other Topics Concern   Not on file  Social History Narrative   Not on file   Social Determinants of Health   Financial Resource Strain: Not on file  Food Insecurity: Not on file  Transportation Needs: Not on file  Physical Activity: Not on file  Stress: Not on file  Social Connections: Not on file    Allergies:  Allergies  Allergen Reactions   Amoxapine Itching   Amoxicillin Itching    Dapagliflozin Itching    Other reaction(s): Other (See Comments) Thrush & vaginal itching   Keflex [Cephalexin] Itching   Mirtazapine Other (See Comments)    Pt states felling "like my body is outside of me."    Metabolic Disorder Labs: Lab Results  Component Value Date   HGBA1C 9.9 (H) 06/21/2017   MPG 237.43 06/21/2017   Lab Results  Component Value Date   PROLACTIN 5.5 09/06/2017   Lab Results  Component Value Date   CHOL 277 (H) 06/21/2017   TRIG 196 (H) 06/21/2017   HDL 64 06/21/2017   CHOLHDL 4.3 06/21/2017   VLDL 39 06/21/2017   LDLCALC 174 (H) 06/21/2017  Lab Results  Component Value Date   TSH 2.890 09/06/2017   TSH 3.176 01/04/2017    Therapeutic Level Labs: No results found for: LITHIUM No results found for: VALPROATE No components found for:  CBMZ  Current Medications: Current Outpatient Medications  Medication Sig Dispense Refill   diltiazem (TIAZAC) 240 MG 24 hr capsule TAKE (1) CAPSULE BY MOUTH EVERY DAY     nitroGLYCERIN (NITROSTAT) 0.4 MG SL tablet Place under the tongue.     ACCU-CHEK AVIVA PLUS test strip      ACCU-CHEK SOFTCLIX LANCETS lancets  (Patient not taking: Reported on 12/08/2020)     Acetaminophen (TYLENOL ARTHRITIS EXT RELIEF PO) Take 600 mg by mouth daily.     acetaZOLAMIDE (DIAMOX) 250 MG tablet  (Patient not taking: Reported on 12/08/2020)     acetaZOLAMIDE (DIAMOX) 250 MG tablet Take by mouth.     AIMOVIG 70 MG/ML SOAJ Inject 70 mg into the skin every 28 (twenty-eight) days.     albuterol (VENTOLIN HFA) 108 (90 Base) MCG/ACT inhaler Inhale 2 puffs into the lungs every 6 (six) hours as needed for wheezing or shortness of breath. 1 g 0   ARIPiprazole (ABILIFY) 5 MG tablet Take 1 tablet (5 mg total) by mouth daily. 90 tablet 0   aspirin-acetaminophen-caffeine (EXCEDRIN MIGRAINE) 250-250-65 MG tablet Take 2 tablets by mouth every 8 (eight) hours as needed for headache or migraine.      atorvastatin (LIPITOR) 80 MG tablet Take 80 mg by  mouth daily at 6 PM.     Blood Glucose Monitoring Suppl (ACCU-CHEK AVIVA PLUS) w/Device KIT      Carboxymethylcellulose Sod PF 0.5 % SOLN Apply 1 drop to eye daily as needed.     Cholecalciferol (VITAMIN D3) 1000 units CAPS Take 1 capsule by mouth daily.     clotrimazole (LOTRIMIN) 1 % cream Apply 1 application topically daily as needed.     cyclobenzaprine (FLEXERIL) 5 MG tablet Take 5 mg by mouth 3 (three) times daily as needed.      cyclobenzaprine (FLEXERIL) 5 MG tablet Take 1 tablet by mouth 3 (three) times daily as needed.     dabigatran (PRADAXA) 150 MG CAPS capsule Take 150 mg by mouth 2 (two) times daily.     diltiazem (CARDIZEM CD) 240 MG 24 hr capsule Take by mouth. Take 1 capsule by mouth once daily     DULoxetine (CYMBALTA) 60 MG capsule Take 1 capsule (60 mg total) by mouth 2 (two) times daily. 180 capsule 0   esomeprazole (NEXIUM) 40 MG capsule Take 40 mg by mouth daily.     famotidine (PEPCID) 40 MG tablet Take 40 mg by mouth 2 (two) times daily.     ferrous sulfate 325 (65 FE) MG tablet Take by mouth. Take 325 mg daily with breakfast     fluticasone (FLONASE) 50 MCG/ACT nasal spray Place 2 sprays into both nostrils as needed.      furosemide (LASIX) 20 MG tablet Take 20 mg by mouth daily.     gabapentin (NEURONTIN) 600 MG tablet TAKE 1 TABLET BY MOUTH TWICE DAILY 180 tablet 0   hydrOXYzine (VISTARIL) 25 MG capsule Take 1 capsule (25 mg total) by mouth 2 (two) times daily as needed for anxiety. 180 capsule 0   Insulin Degludec (TRESIBA FLEXTOUCH) 200 UNIT/ML SOPN Inject 200 Units into the skin daily.     insulin lispro (HUMALOG) 100 UNIT/ML KwikPen Inject up to 50 units daily in divided doses, as directed  Insulin Pen Needle (BD ULTRA-FINE PEN NEEDLES) 29G X 12.7MM MISC Use 3 times daily as directed     lamoTRIgine (LAMICTAL) 100 MG tablet Take 100 mg by mouth daily. (Patient not taking: Reported on 12/17/2020)     lamoTRIgine (LAMICTAL) 150 MG tablet Take 150 mg by mouth  daily.     levothyroxine (SYNTHROID) 112 MCG tablet Take 112 mcg by mouth daily.     levothyroxine (SYNTHROID, LEVOTHROID) 100 MCG tablet Take 100 mcg by mouth daily. (Patient not taking: No sig reported)     losartan (COZAAR) 25 MG tablet Take 12.5 mg by mouth daily.     losartan (COZAAR) 25 MG tablet Take by mouth. (Patient not taking: No sig reported)     Magnesium 250 MG TABS Take 1 tablet by mouth daily.     magnesium oxide (MAG-OX) 400 MG tablet Take 1 tablet by mouth daily.     Melatonin 10 MG TABS Take 2 tablets by mouth at bedtime.     meloxicam (MOBIC) 15 MG tablet Take 15 mg by mouth daily.     metFORMIN (GLUCOPHAGE-XR) 500 MG 24 hr tablet Take 500 mg by mouth 2 (two) times daily.      metoprolol succinate (TOPROL-XL) 25 MG 24 hr tablet Take 1 tablet by mouth daily.     naloxone (NARCAN) nasal spray 4 mg/0.1 mL 1 spray into nose for opioid overdose. If needed, may repeat spray in 2-3 min     naloxone (NARCAN) nasal spray 4 mg/0.1 mL Place into the nose.     NUCYNTA ER 100 MG 12 hr tablet Take 1 tablet by mouth 2 (two) times daily.     nystatin (MYCOSTATIN) 100000 UNIT/ML suspension Take 5 mLs by mouth 4 (four) times daily.     omeprazole (PRILOSEC) 20 MG capsule TAKE (1) CAPSULE BY MOUTH EVERY DAY 30 capsule 0   ondansetron (ZOFRAN) 4 MG tablet Take 4 mg by mouth at bedtime.     oxyCODONE (OXY IR/ROXICODONE) 5 MG immediate release tablet Take 5 mg by mouth 2 (two) times daily as needed.     pioglitazone (ACTOS) 15 MG tablet Take by mouth. Take 1 tablet by mouth once daily     quinapril (ACCUPRIL) 10 MG tablet Take 10 mg by mouth daily.     Riboflavin 400 MG TABS Take 1 tablet by mouth every night  for headaches     Rimegepant Sulfate (NURTEC) 75 MG TBDP Take 1 tablet by mouth as needed.      traZODone (DESYREL) 100 MG tablet TAKE 1.5-2 TABLETS BY MOUTH AT BEDTIME AS NEEDED FOR SLEEP 180 tablet 0   TRESIBA FLEXTOUCH 200 UNIT/ML SOPN Inject 76 Units into the skin at bedtime.     No  current facility-administered medications for this visit.     Musculoskeletal: Strength & Muscle Tone:  UTA Gait & Station:  Seated Patient leans: N/A  Psychiatric Specialty Exam: Review of Systems  Musculoskeletal:  Positive for back pain.  Psychiatric/Behavioral:  Negative for agitation, decreased concentration, sleep disturbance and suicidal ideas. The patient is not nervous/anxious.   All other systems reviewed and are negative.  There were no vitals taken for this visit.There is no height or weight on file to calculate BMI.  General Appearance: Casual  Eye Contact:  Fair  Speech:  Normal Rate  Volume:  Normal  Mood:  Euthymic  Affect:  Congruent  Thought Process:  Goal Directed and Descriptions of Associations: Intact  Orientation:  Full (Time, Place,  and Person)  Thought Content: Logical   Suicidal Thoughts:  No  Homicidal Thoughts:  No  Memory:  Immediate;   Fair Recent;   Fair Remote;   Fair  Judgement:  Fair  Insight:  Fair  Psychomotor Activity:  Normal  Concentration:  Concentration: Fair and Attention Span: Fair  Recall:  [employer] of Knowledge: Fair  Language: Fair  Akathisia:  No  Handed:  Right  AIMS (if indicated): done  Assets:  Communication Skills Desire for Improvement Housing Social Support Transportation  ADL's:  Intact  Cognition: WNL  Sleep:  Fair   Screenings: GAD-7    Flowsheet Row Video Visit from 01/22/2021 in [facility] from 01/19/2021 in [facility]  Total GAD-7 Score 2 21      PHQ2-9    [place] Video Visit from 01/22/2021 in [facility] from 01/19/2021 in [facility] Video Visit from 12/08/2020 in [facility] Video Visit from 08/04/2020 in [facility] Video Visit from 07/07/2020 in [facility]  PHQ-2 Total  Score _0 PHQ-9 Total Score _1 -- --      Flowsheet Row Video Visit from 01/22/2021 in [facility] from 01/19/2021 in [facility] Video Visit from 12/08/2020 in [facility] Low Risk No Risk Low Risk        Assessment and Plan: BRAYLEIGH RYBACKI is a 55 year old Caucasian female who has a history of MDD, PTSD, chronic pain, migraine headache, history of CVA, iron deficiency, prothrombin gene mutation was evaluated by telemedicine today.  Patient is currently improved, stable on medications.  Plan as noted below.  Plan MDD in remission Cymbalta 120 mg p.o. daily in divided dosage Lamotrigine 125 mg p.o. daily Abilify 5 mg p.o. daily  GAD-improving Cymbalta as prescribed Hydroxyzine 25 mg p.o. daily as needed for severe anxiety attacks  Insomnia-stable Trazodone 150-200 mg p.o. nightly Melatonin 3 to 9 mg p.o. nightly Hydroxyzine 25 mg as needed for sleep  Follow-up in clinic in 2 months in person.  This note was generated in part or whole with voice recognition software. Voice recognition is usually quite accurate but there are transcription errors that can and very often do occur. I apologize for any typographical errors that were not detected and corrected.       Ursula Alert, MD 02/23/2021, 4:21 PM

## 2021-03-18 ENCOUNTER — Other Ambulatory Visit: Payer: Self-pay

## 2021-03-18 ENCOUNTER — Inpatient Hospital Stay: Payer: Medicare Other | Attending: Oncology

## 2021-03-18 DIAGNOSIS — D509 Iron deficiency anemia, unspecified: Secondary | ICD-10-CM | POA: Insufficient documentation

## 2021-03-18 DIAGNOSIS — D508 Other iron deficiency anemias: Secondary | ICD-10-CM

## 2021-03-18 LAB — CBC WITH DIFFERENTIAL/PLATELET
Abs Immature Granulocytes: 0.02 10*3/uL (ref 0.00–0.07)
Basophils Absolute: 0 10*3/uL (ref 0.0–0.1)
Basophils Relative: 1 %
Eosinophils Absolute: 0.1 10*3/uL (ref 0.0–0.5)
Eosinophils Relative: 2 %
HCT: 41.9 % (ref 36.0–46.0)
Hemoglobin: 13.4 g/dL (ref 12.0–15.0)
Immature Granulocytes: 0 %
Lymphocytes Relative: 40 %
Lymphs Abs: 2.2 10*3/uL (ref 0.7–4.0)
MCH: 28.7 pg (ref 26.0–34.0)
MCHC: 32 g/dL (ref 30.0–36.0)
MCV: 89.7 fL (ref 80.0–100.0)
Monocytes Absolute: 0.4 10*3/uL (ref 0.1–1.0)
Monocytes Relative: 6 %
Neutro Abs: 2.9 10*3/uL (ref 1.7–7.7)
Neutrophils Relative %: 51 %
Platelets: 189 10*3/uL (ref 150–400)
RBC: 4.67 MIL/uL (ref 3.87–5.11)
RDW: 12.7 % (ref 11.5–15.5)
WBC: 5.7 10*3/uL (ref 4.0–10.5)
nRBC: 0 % (ref 0.0–0.2)

## 2021-03-18 LAB — IRON AND TIBC
Iron: 87 ug/dL (ref 28–170)
Saturation Ratios: 18 % (ref 10.4–31.8)
TIBC: 473 ug/dL — ABNORMAL HIGH (ref 250–450)
UIBC: 386 ug/dL

## 2021-03-18 LAB — FERRITIN: Ferritin: 29 ng/mL (ref 11–307)

## 2021-03-18 LAB — VITAMIN B12: Vitamin B-12: 209 pg/mL (ref 180–914)

## 2021-03-23 ENCOUNTER — Other Ambulatory Visit: Payer: Self-pay

## 2021-03-23 ENCOUNTER — Ambulatory Visit (INDEPENDENT_AMBULATORY_CARE_PROVIDER_SITE_OTHER): Payer: Medicare Other | Admitting: Licensed Clinical Social Worker

## 2021-03-23 DIAGNOSIS — F4310 Post-traumatic stress disorder, unspecified: Secondary | ICD-10-CM | POA: Diagnosis not present

## 2021-03-23 NOTE — Plan of Care (Signed)
  Problem: Reduce the negative impact trauma related symptoms have on social, occupational, and family functioning. Goal: LTG: Reduce frequency, intensity, and duration of PTSD symptoms so daily functioning is improved: Input needed on appropriate metric.  pt self report Outcome: Progressing Goal: STG: Practice interpersonal effectiveness skills 7 times per week for the next 16 weeks Outcome: Progressing Intervention: Discuss self-management skills Intervention: Encourage patient to identify triggers Intervention: REVIEW PLEASE SKILLS (TREAT PHYSICAL ILLNESS, BALANCE EATING, AVOID MOOD-ALTERING SUBSTANCES, BALANCE SLEEP AND GET EXERCISE) WITH Christine Mcneil Intervention: Educate patient on: Stress management

## 2021-03-23 NOTE — Progress Notes (Signed)
Virtual Visit via Video Note  I connected with Christine Mcneil on 03/23/21 at  8:00 AM EDT by a video enabled telemedicine application and verified that I am speaking with the correct person using two identifiers.  Location: Patient: home Provider: remote office [place], [place])   I discussed the limitations of evaluation and management by telemedicine and the availability of in person appointments. The patient expressed understanding and agreed to proceed.   I discussed the assessment and treatment plan with the patient. The patient was provided an opportunity to ask questions and all were answered. The patient agreed with the plan and demonstrated an understanding of the instructions.   The patient was advised to call back or seek an in-person evaluation if the symptoms worsen or if the condition fails to improve as anticipated.  I provided 30 minutes of non-face-to-face time during this encounter.   [place], LCSW   THERAPIST PROGRESS NOTE  Session Time: 8-830a  Participation Level: Active  Behavioral Response: Neat and Well GroomedAlertDepressed  Type of Therapy: Individual Therapy  Treatment Goals addressed:  Goal: LTG: Reduce frequency, intensity, and duration of PTSD symptoms so daily functioning is improved: Input needed on appropriate metric.  pt self report Outcome: Progressing Goal: STG: Practice interpersonal effectiveness skills 7 times per week for the next 16 weeks Outcome: Progressing Interventions:  Intervention: Discuss self-management skills Intervention: Encourage patient to identify triggers Intervention: REVIEW PLEASE SKILLS (TREAT PHYSICAL ILLNESS, BALANCE EATING, AVOID MOOD-ALTERING SUBSTANCES, BALANCE SLEEP AND GET EXERCISE) WITH [place] Intervention: Educate patient on: Stress management   Summary: Christine Mcneil is a 55 y.o. female who presents with improving symptoms related to PTSD  and depression diagnoses. Pt reports that overall mood has  been stable and that she is managing her symptoms well. Reviewed coping skills for depression and anxiety symptoms. Pt reports that she is doing better with sleep quality, but waking up in the middle of the night at times.   Allowed pt to explore and express thoughts and feelings associated with recent life situations and external stressors. Discussed relationship with sister, relationship with mother, relationship with boyfriend, health-related concerns, and ways to manage caregiver stress. Pt enjoys crafting--encouraged pt to have days where she can exclusively take care of herself to help recharge her energy.   Continued recommendations are as follows: self care behaviors, positive social engagements, focusing on overall work/home/life balance, and focusing on positive physical and emotional wellness. .   Suicidal/Homicidal: No  Therapist Response: Pt is continuing to apply interventions learned in session into daily life situations. Pt is currently on track to meet goals utilizing interventions mentioned above. Personal growth and progress noted. Treatment to continue as indicated.    Plan: Return again in 4 weeks.  Diagnosis: Axis I: PTSD    Axis II: No diagnosis    [place], LCSW 03/23/2021

## 2021-03-25 ENCOUNTER — Telehealth: Payer: Self-pay

## 2021-03-25 NOTE — Telephone Encounter (Signed)
-----   Message from Rochele Raring sent at 03/24/2021  8:48 AM EDT ----- Bonne Dolores to contact patient--no answer and her mailbox is full. Sent her a Mychart message also to see if she wants to try oral supplements or come in for infusion.  Anderson Malta   ----- Message ----- From: Sindy Guadel[place], MD Sent: 03/20/2021  11:54 AM EDT To: Rochele Raring, #  Pt is not anemic but has low iron and b12. Does she want to try po iron and b12 or come for infusion?

## 2021-03-25 NOTE — Telephone Encounter (Signed)
Reached out to pt and informed her of low b12 and iron. Pt states she will like to come in for infusions for both iron and b12. Informed pt Anderson Malta will reach out with the scheduled appointments. Pt agreed and prefers mornings.

## 2021-04-01 ENCOUNTER — Other Ambulatory Visit: Payer: Self-pay

## 2021-04-01 ENCOUNTER — Inpatient Hospital Stay: Payer: Medicare Other | Attending: Oncology

## 2021-04-01 ENCOUNTER — Other Ambulatory Visit: Payer: Self-pay | Admitting: Oncology

## 2021-04-01 VITALS — BP 114/72 | HR 77 | Temp 96.0°F | Resp 18

## 2021-04-01 DIAGNOSIS — D509 Iron deficiency anemia, unspecified: Secondary | ICD-10-CM | POA: Diagnosis not present

## 2021-04-01 DIAGNOSIS — E538 Deficiency of other specified B group vitamins: Secondary | ICD-10-CM | POA: Insufficient documentation

## 2021-04-01 MED ORDER — SODIUM CHLORIDE 0.9 % IV SOLN: 200.0000 mg | INTRAVENOUS | Status: DC

## 2021-04-01 MED ORDER — CYANOCOBALAMIN 1000 MCG/ML IJ SOLN
1000.0000 ug | INTRAMUSCULAR | Status: DC
  Administered 2021-04-01: 1000 ug via INTRAMUSCULAR
  Filled 2021-04-01: qty 1

## 2021-04-01 MED ORDER — IRON SUCROSE 20 MG/ML IV SOLN
200.0000 mg | Freq: Once | INTRAVENOUS | Status: AC
  Administered 2021-04-01: 200 mg via INTRAVENOUS
  Filled 2021-04-01: qty 10

## 2021-04-01 MED ORDER — SODIUM CHLORIDE 0.9 % IV SOLN
Freq: Once | INTRAVENOUS | Status: AC
  Filled 2021-04-01: qty 250

## 2021-04-01 NOTE — Patient Instructions (Signed)

## 2021-04-03 ENCOUNTER — Other Ambulatory Visit: Payer: Self-pay

## 2021-04-03 ENCOUNTER — Inpatient Hospital Stay: Payer: Medicare Other

## 2021-04-03 VITALS — BP 100/71 | HR 77 | Temp 96.6°F | Resp 20

## 2021-04-03 DIAGNOSIS — D509 Iron deficiency anemia, unspecified: Secondary | ICD-10-CM | POA: Diagnosis not present

## 2021-04-03 MED ORDER — SODIUM CHLORIDE 0.9 % IV SOLN
Freq: Once | INTRAVENOUS | Status: AC
  Filled 2021-04-03: qty 250

## 2021-04-03 MED ORDER — SODIUM CHLORIDE 0.9 % IV SOLN: 200.0000 mg | INTRAVENOUS | Status: DC

## 2021-04-03 MED ORDER — IRON SUCROSE 20 MG/ML IV SOLN
200.0000 mg | Freq: Once | INTRAVENOUS | Status: AC
  Administered 2021-04-03: 200 mg via INTRAVENOUS
  Filled 2021-04-03: qty 10

## 2021-04-03 NOTE — Patient Instructions (Signed)
[facility]   Discharge Instructions: Thank you for choosing [facility] to provide your oncology and hematology care.  If you have a lab appointment with the Cancer Center, please go directly to the Cancer Center and check in at the registration area.  We strive to give you quality time with your provider. You may need to reschedule your appointment if you arrive late (15 or more minutes).  Arriving late affects you and other patients whose appointments are after yours.  Also, if you miss three or more appointments without notifying the office, you may be dismissed from the clinic at the provider's discretion.      For prescription refill requests, have your pharmacy contact our office and allow 72 hours for refills to be completed.    Today you received the following: [place].      BELOW ARE SYMPTOMS THAT SHOULD BE REPORTED IMMEDIATELY: *FEVER GREATER THAN 100.4 F (38 C) OR HIGHER *CHILLS OR SWEATING *NAUSEA AND VOMITING THAT IS NOT CONTROLLED WITH YOUR NAUSEA MEDICATION *UNUSUAL SHORTNESS OF BREATH *UNUSUAL BRUISING OR BLEEDING *URINARY PROBLEMS (pain or burning when urinating, or frequent urination) *BOWEL PROBLEMS (unusual diarrhea, constipation, pain near the anus) TENDERNESS IN MOUTH AND THROAT WITH OR WITHOUT PRESENCE OF ULCERS (sore throat, sores in mouth, or a toothache) UNUSUAL RASH, SWELLING OR PAIN  UNUSUAL VAGINAL DISCHARGE OR ITCHING   Items with * indicate a potential emergency and should be followed up as soon as possible or go to the Emergency Department if any problems should occur.  Should you have questions after your visit or need to cancel or reschedule your appointment, please contact [facility]  [phone] and follow the prompts.  Office hours are 8:00 a.m. to 4:30 p.m. Monday - Friday. Please note that voicemails left after 4:00 p.m. may not be returned until the following  business day.  We are closed weekends and major holidays. You have access to a nurse at all times for urgent questions. Please call the main number to the clinic [phone] and follow the prompts.  For any non-urgent questions, you may also contact your provider using MyChart. We now offer e-Visits for anyone 18 and older to request care online for non-urgent symptoms. For details visit [email or web address].   Also download the MyChart app! Go to the app store, search "MyChart", open the app, select [facility], and log in with your MyChart username and password.  Due to Covid, a mask is required upon entering the hospital/clinic. If you do not have a mask, one will be given to you upon arrival. For doctor visits, patients may have 1 support person aged 18 or older with them. For treatment visits, patients cannot have anyone with them due to current Covid guidelines and our immunocompromised population.  

## 2021-04-08 ENCOUNTER — Other Ambulatory Visit: Payer: Self-pay

## 2021-04-08 ENCOUNTER — Inpatient Hospital Stay: Payer: Medicare Other

## 2021-04-08 VITALS — BP 121/73 | HR 86 | Resp 20

## 2021-04-08 DIAGNOSIS — D509 Iron deficiency anemia, unspecified: Secondary | ICD-10-CM

## 2021-04-08 MED ORDER — SODIUM CHLORIDE 0.9 % IV SOLN
Freq: Once | INTRAVENOUS | Status: AC
  Filled 2021-04-08: qty 250

## 2021-04-08 MED ORDER — CYANOCOBALAMIN 1000 MCG/ML IJ SOLN
1000.0000 ug | INTRAMUSCULAR | Status: DC
  Administered 2021-04-08: 1000 ug via INTRAMUSCULAR
  Filled 2021-04-08: qty 1

## 2021-04-08 MED ORDER — IRON SUCROSE 20 MG/ML IV SOLN
200.0000 mg | Freq: Once | INTRAVENOUS | Status: AC
  Administered 2021-04-08: 200 mg via INTRAVENOUS
  Filled 2021-04-08: qty 10

## 2021-04-08 MED ORDER — SODIUM CHLORIDE 0.9 % IV SOLN: 200.0000 mg | INTRAVENOUS | Status: DC

## 2021-04-08 NOTE — Patient Instructions (Signed)
[facility]  Discharge Instructions: Thank you for choosing [facility] to provide your oncology and hematology care.  If you have a lab appointment with the [facility], please go directly to the [facility] and check in at the registration area.  Wear comfortable clothing and clothing appropriate for easy access to any Portacath or PICC line.   We strive to give you quality time with your provider. You may need to reschedule your appointment if you arrive late (15 or more minutes).  Arriving late affects you and other patients whose appointments are after yours.  Also, if you miss three or more appointments without notifying the office, you may be dismissed from the clinic at the provider's discretion.      For prescription refill requests, have your pharmacy contact our office and allow 72 hours for refills to be completed.    Today you received the following : venofer / B12 shot   To help prevent nausea and vomiting after your treatment, we encourage you to take your nausea medication as directed.  BELOW ARE SYMPTOMS THAT SHOULD BE REPORTED IMMEDIATELY: *FEVER GREATER THAN 100.4 F (38 C) OR HIGHER *CHILLS OR SWEATING *NAUSEA AND VOMITING THAT IS NOT CONTROLLED WITH YOUR NAUSEA MEDICATION *UNUSUAL SHORTNESS OF BREATH *UNUSUAL BRUISING OR BLEEDING *URINARY PROBLEMS (pain or burning when urinating, or frequent urination) *BOWEL PROBLEMS (unusual diarrhea, constipation, pain near the anus) TENDERNESS IN MOUTH AND THROAT WITH OR WITHOUT PRESENCE OF ULCERS (sore throat, sores in mouth, or a toothache) UNUSUAL RASH, SWELLING OR PAIN  UNUSUAL VAGINAL DISCHARGE OR ITCHING   Items with * indicate a potential emergency and should be followed up as soon as possible or go to the Emergency Department if any problems should occur.  Please show the CHEMOTHERAPY ALERT CARD or IMMUNOTHERAPY ALERT CARD at check-in to the Emergency Department and  triage nurse.  Should you have questions after your visit or need to cancel or reschedule your appointment, please contact [facility]  [phone] and follow the prompts.  Office hours are 8:00 a.m. to 4:30 p.m. Monday - Friday. Please note that voicemails left after 4:00 p.m. may not be returned until the following business day.  We are closed weekends and major holidays. You have access to a nurse at all times for urgent questions. Please call the main number to the clinic [phone] and follow the prompts.  For any non-urgent questions, you may also contact your provider using MyChart. We now offer e-Visits for anyone 75 and older to request care online for non-urgent symptoms. For details visit [email or web address].   Also download the MyChart app! Go to the app store, search "MyChart", open the app, select [facility], and log in with your MyChart username and password.  Due to Covid, a mask is required upon entering the hospital/clinic. If you do not have a mask, one will be given to you upon arrival. For doctor visits, patients may have 1 support person aged 59 or older with them. For treatment visits, patients cannot have anyone with them due to current Covid guidelines and our immunocompromised population.

## 2021-04-10 ENCOUNTER — Inpatient Hospital Stay: Payer: Medicare Other

## 2021-04-10 ENCOUNTER — Other Ambulatory Visit: Payer: Self-pay

## 2021-04-10 VITALS — BP 122/81 | HR 87 | Temp 96.9°F | Resp 18

## 2021-04-10 DIAGNOSIS — D509 Iron deficiency anemia, unspecified: Secondary | ICD-10-CM | POA: Diagnosis not present

## 2021-04-10 MED ORDER — IRON SUCROSE 20 MG/ML IV SOLN
200.0000 mg | Freq: Once | INTRAVENOUS | Status: AC
  Administered 2021-04-10: 200 mg via INTRAVENOUS
  Filled 2021-04-10: qty 10

## 2021-04-10 MED ORDER — SODIUM CHLORIDE 0.9 % IV SOLN
Freq: Once | INTRAVENOUS | Status: AC
  Filled 2021-04-10: qty 250

## 2021-04-10 MED ORDER — SODIUM CHLORIDE 0.9 % IV SOLN: 200.0000 mg | INTRAVENOUS | Status: DC

## 2021-04-10 NOTE — Patient Instructions (Signed)
[facility]  Discharge Instructions: Thank you for choosing [facility] to provide your oncology and hematology care.  If you have a lab appointment with the [facility], please go directly to the [facility] and check in at the registration area.  Wear comfortable clothing and clothing appropriate for easy access to any Portacath or PICC line.   We strive to give you quality time with your provider. You may need to reschedule your appointment if you arrive late (15 or more minutes).  Arriving late affects you and other patients whose appointments are after yours.  Also, if you miss three or more appointments without notifying the office, you may be dismissed from the clinic at the provider's discretion.      For prescription refill requests, have your pharmacy contact our office and allow 72 hours for refills to be completed.    Today you received the following chemotherapy and/or immunotherapy agents VENOFEr      To help prevent nausea and vomiting after your treatment, we encourage you to take your nausea medication as directed.  BELOW ARE SYMPTOMS THAT SHOULD BE REPORTED IMMEDIATELY: *FEVER GREATER THAN 100.4 F (38 C) OR HIGHER *CHILLS OR SWEATING *NAUSEA AND VOMITING THAT IS NOT CONTROLLED WITH YOUR NAUSEA MEDICATION *UNUSUAL SHORTNESS OF BREATH *UNUSUAL BRUISING OR BLEEDING *URINARY PROBLEMS (pain or burning when urinating, or frequent urination) *BOWEL PROBLEMS (unusual diarrhea, constipation, pain near the anus) TENDERNESS IN MOUTH AND THROAT WITH OR WITHOUT PRESENCE OF ULCERS (sore throat, sores in mouth, or a toothache) UNUSUAL RASH, SWELLING OR PAIN  UNUSUAL VAGINAL DISCHARGE OR ITCHING   Items with * indicate a potential emergency and should be followed up as soon as possible or go to the Emergency Department if any problems should occur.  Please show the CHEMOTHERAPY ALERT CARD or IMMUNOTHERAPY ALERT CARD at check-in to  the Emergency Department and triage nurse.  Should you have questions after your visit or need to cancel or reschedule your appointment, please contact [facility]  [phone] and follow the prompts.  Office hours are 8:00 a.m. to 4:30 p.m. Monday - Friday. Please note that voicemails left after 4:00 p.m. may not be returned until the following business day.  We are closed weekends and major holidays. You have access to a nurse at all times for urgent questions. Please call the main number to the clinic [phone] and follow the prompts.  For any non-urgent questions, you may also contact your provider using MyChart. We now offer e-Visits for anyone 6 and older to request care online for non-urgent symptoms. For details visit [email or web address].   Also download the MyChart app! Go to the app store, search "MyChart", open the app, select [facility], and log in with your MyChart username and password.  Due to Covid, a mask is required upon entering the hospital/clinic. If you do not have a mask, one will be given to you upon arrival. For doctor visits, patients may have 1 support person aged 57 or older with them. For treatment visits, patients cannot have anyone with them due to current Covid guidelines and our immunocompromised population.   Iron Sucrose Injection What is this medication? IRON SUCROSE (EYE ern SOO krose) treats low levels of iron (iron deficiency anemia) in people with kidney disease. Iron is a mineral that plays an important role in making red blood cells, which carry oxygen from your lungs to the rest of your body. This medicine may  be used for other purposes; ask your health care provider or pharmacist if you have questions. COMMON BRAND NAME(S): Venofer What should I tell my care team before I take this medication? They need to know if you have any of these conditions: Anemia not caused by low iron levels Heart disease High levels  of iron in the blood Kidney disease Liver disease An unusual or allergic reaction to iron, other medications, foods, dyes, or preservatives Pregnant or trying to get pregnant Breast-feeding How should I use this medication? This medication is for infusion into a vein. It is given in a hospital or clinic setting. Talk to your care team about the use of this medication in children. While this medication may be prescribed for children as young as 2 years for selected conditions, precautions do apply. Overdosage: If you think you have taken too much of this medicine contact a poison control center or emergency room at once. NOTE: This medicine is only for you. Do not share this medicine with others. What if I miss a dose? It is important not to miss your dose. Call your care team if you are unable to keep an appointment. What may interact with this medication? Do not take this medication with any of the following: Deferoxamine Dimercaprol Other iron products This medication may also interact with the following: Chloramphenicol Deferasirox This list may not describe all possible interactions. Give your health care provider a list of all the medicines, herbs, non-prescription drugs, or dietary supplements you use. Also tell them if you smoke, drink alcohol, or use illegal drugs. Some items may interact with your medicine. What should I watch for while using this medication? Visit your care team regularly. Tell your care team if your symptoms do not start to get better or if they get worse. You may need blood work done while you are taking this medication. You may need to follow a special diet. Talk to your care team. Foods that contain iron include: whole grains/cereals, dried fruits, beans, or peas, leafy green vegetables, and organ meats (liver, kidney). What side effects may I notice from receiving this medication? Side effects that you should report to your care team as soon as  possible: Allergic reactions--skin rash, itching, hives, swelling of the face, lips, tongue, or throat Low blood pressure--dizziness, feeling faint or lightheaded, blurry vision Shortness of breath Side effects that usually do not require medical attention (report to your care team if they continue or are bothersome): Flushing Headache Joint pain Muscle pain Nausea Pain, redness, or irritation at injection site This list may not describe all possible side effects. Call your doctor for medical advice about side effects. You may report side effects to FDA at 1-800-FDA-1088. Where should I keep my medication? This medication is given in a hospital or clinic and will not be stored at home. NOTE: This sheet is a summary. It may not cover all possible information. If you have questions about this medicine, talk to your doctor, pharmacist, or health care provider.  2022 Elsevier/Gold Standard (2020-10-03 00:00:00)  

## 2021-04-13 ENCOUNTER — Other Ambulatory Visit: Payer: Self-pay | Admitting: Psychiatry

## 2021-04-13 DIAGNOSIS — F411 Generalized anxiety disorder: Secondary | ICD-10-CM

## 2021-04-13 NOTE — Telephone Encounter (Signed)
Will defer this to you

## 2021-04-20 ENCOUNTER — Other Ambulatory Visit: Payer: Self-pay | Admitting: Psychiatry

## 2021-04-20 DIAGNOSIS — F411 Generalized anxiety disorder: Secondary | ICD-10-CM

## 2021-04-20 DIAGNOSIS — F4310 Post-traumatic stress disorder, unspecified: Secondary | ICD-10-CM

## 2021-04-23 ENCOUNTER — Other Ambulatory Visit: Payer: Self-pay

## 2021-04-23 ENCOUNTER — Inpatient Hospital Stay: Payer: Medicare Other | Attending: Oncology

## 2021-04-23 VITALS — BP 106/73 | HR 78 | Temp 96.2°F | Resp 18

## 2021-04-23 DIAGNOSIS — D509 Iron deficiency anemia, unspecified: Secondary | ICD-10-CM | POA: Diagnosis not present

## 2021-04-23 MED ORDER — SODIUM CHLORIDE 0.9 % IV SOLN
Freq: Once | INTRAVENOUS | Status: AC
Start: 2021-04-23 — End: 2021-04-23
  Filled 2021-04-23: qty 250

## 2021-04-23 MED ORDER — SODIUM CHLORIDE 0.9 % IV SOLN: 200.0000 mg | INTRAVENOUS | Status: DC

## 2021-04-23 MED ORDER — IRON SUCROSE 20 MG/ML IV SOLN
200.0000 mg | Freq: Once | INTRAVENOUS | Status: AC
  Administered 2021-04-23: 200 mg via INTRAVENOUS
  Filled 2021-04-23: qty 10

## 2021-04-23 NOTE — Progress Notes (Signed)
Pt tolerated venofer infusion well with no problems or complaints.  Pt left infusion suite stable and ambulatory.

## 2021-04-23 NOTE — Patient Instructions (Signed)

## 2021-04-24 ENCOUNTER — Ambulatory Visit (INDEPENDENT_AMBULATORY_CARE_PROVIDER_SITE_OTHER): Payer: Medicare Other | Admitting: Licensed Clinical Social Worker

## 2021-04-24 DIAGNOSIS — F4310 Post-traumatic stress disorder, unspecified: Secondary | ICD-10-CM

## 2021-04-24 NOTE — Plan of Care (Signed)
  Problem: Reduce the negative impact trauma related symptoms have on social, occupational, and family functioning. Goal: LTG: Reduce frequency, intensity, and duration of PTSD symptoms so daily functioning is improved: Input needed on appropriate metric.  pt self report Outcome: Progressing Goal: STG: Practice interpersonal effectiveness skills 7 times per week for the next 16 weeks Outcome: Progressing Intervention: Assess emotional status and coping mechanisms Intervention: Educate patient on: Ways to improve sleep Intervention: Perform motivational interviewing regarding physical activity

## 2021-04-24 NOTE — Progress Notes (Signed)
Virtual Visit via Video Note  I connected with Christine Mcneil on 04/24/21 at  8:00 AM EST by a video enabled telemedicine application and verified that I am speaking with the correct person using two identifiers.  Location: Patient: home Provider: remote office [place], [place])   I discussed the limitations of evaluation and management by telemedicine and the availability of in person appointments. The patient expressed understanding and agreed to proceed.   I discussed the assessment and treatment plan with the patient. The patient was provided an opportunity to ask questions and all were answered. The patient agreed with the plan and demonstrated an understanding of the instructions.   The patient was advised to call back or seek an in-person evaluation if the symptoms worsen or if the condition fails to improve as anticipated.  I provided 30 minutes of non-face-to-face time during this encounter.   [place], LCSW   THERAPIST PROGRESS NOTE  Session Time: 8-830a  Participation Level: Active  Behavioral Response: Neat and Well GroomedAlertDepressed  Type of Therapy: Individual Therapy  Treatment Goals addressed:  Problem: Reduce the negative impact trauma related symptoms have on social, occupational, and family functioning.  Goal: LTG: Reduce frequency, intensity, and duration of PTSD symptoms so daily functioning is improved: Input needed on appropriate metric.  pt self report Outcome: Progressing  Goal: STG: Practice interpersonal effectiveness skills 7 times per week for the next 16 weeks Outcome: Progressing  Interventions:  Intervention: Assess emotional status and coping mechanisms  Intervention: Educate patient on: Ways to improve sleep  Intervention: Perform motivational   interviewing regarding physical activity   Summary: Christine Mcneil is a 55 y.o. female who presents with improving symptoms related to PTSD  and depression diagnoses. Pt reports that  overall mood has been stable and that she is managing her anxiety symptoms well. Pt reports decreased amount of anxiety since last session. Reviewed coping skills for depression and anxiety symptoms. Pt reports that she is doing better with sleep quality, but waking up in the middle of the night at times. Reviewed sleep hygiene.   Allowed pt to explore and express thoughts and feelings associated with recent life situations and external stressors. Pt reports that her and sister have solved their issues, which has decreased stress in patient significantly. Allowed pt to explore the conversation that happened between herself and sister. Pt reports good relationship with mother. Explored Thanksgiving experience recently--pt states that it was a very positive experience and that she is looking forward to Christmas.   Continued recommendations are as follows: self care behaviors, positive social engagements, focusing on overall work/home/life balance, and focusing on positive physical and emotional wellness. .   Suicidal/Homicidal: No  Therapist Response: Pt is continuing to apply interventions learned in session into daily life situations. Pt is currently on track to meet goals utilizing interventions mentioned above. Personal growth and progress noted. Treatment to continue as indicated.    Plan: Return again in 4 weeks.  Diagnosis: Axis I: PTSD    Axis II: No diagnosis    Christine Bo Holden Maniscalco, LCSW 04/24/2021

## 2021-04-28 ENCOUNTER — Telehealth (INDEPENDENT_AMBULATORY_CARE_PROVIDER_SITE_OTHER): Payer: Medicare Other | Admitting: Psychiatry

## 2021-04-28 ENCOUNTER — Encounter: Payer: Self-pay | Admitting: Psychiatry

## 2021-04-28 ENCOUNTER — Other Ambulatory Visit: Payer: Self-pay

## 2021-04-28 DIAGNOSIS — F4310 Post-traumatic stress disorder, unspecified: Secondary | ICD-10-CM | POA: Diagnosis not present

## 2021-04-28 DIAGNOSIS — F3342 Major depressive disorder, recurrent, in full remission: Secondary | ICD-10-CM

## 2021-04-28 DIAGNOSIS — G4701 Insomnia due to medical condition: Secondary | ICD-10-CM | POA: Diagnosis not present

## 2021-04-28 DIAGNOSIS — Z634 Disappearance and death of family member: Secondary | ICD-10-CM

## 2021-04-28 DIAGNOSIS — F411 Generalized anxiety disorder: Secondary | ICD-10-CM | POA: Diagnosis not present

## 2021-04-28 DIAGNOSIS — Z79899 Other long term (current) drug therapy: Secondary | ICD-10-CM | POA: Insufficient documentation

## 2021-04-28 MED ORDER — ARIPIPRAZOLE 5 MG PO TABS
2.5000 mg | ORAL_TABLET | Freq: Every day | ORAL | 0 refills | Status: DC
Start: 2021-04-28 — End: 2021-05-26

## 2021-04-28 MED ORDER — GABAPENTIN 600 MG PO TABS: 600.0000 mg | ORAL_TABLET | Freq: Two times a day (BID) | ORAL | 0 refills | Status: DC

## 2021-04-28 NOTE — Progress Notes (Signed)
Virtual Visit via Video Note  I connected with Christine Mcneil on 04/28/21 at 11:40 AM EST by a video enabled telemedicine application and verified that I am speaking with the correct person using two identifiers.  Location Provider Location : [facility] Patient Location : Home  Participants: Patient , Provider   I discussed the limitations of evaluation and management by telemedicine and the availability of in person appointments. The patient expressed understanding and agreed to proceed.   I discussed the assessment and treatment plan with the patient. The patient was provided an opportunity to ask questions and all were answered. The patient agreed with the plan and demonstrated an understanding of the instructions.   The patient was advised to call back or seek an in-person evaluation if the symptoms worsen or if the condition fails to improve as anticipated.    [facility] MD OP Progress Note  04/28/2021 1:12 PM Christine Mcneil  MRN:  [id number]  Chief Complaint:  Chief Complaint   Follow-up; Anxiety; Depression    HPI: Christine Mcneil is a 55 year old Caucasian female, divorced, lives in [place], has a history of MDD, PTSD, insomnia, OSA-resolved, history of CVA, history of prothrombin gene mutation, chronic pain, migraine headache, iron deficiency was evaluated by telemedicine today.  Patient today reports she is currently doing well on the current medication regimen.  Denies any significant mood lability.  Denies any sadness, crying spells or anxiety.  Patient reports sleep as good.  Patient denies any suicidality, homicidality or perceptual disturbances.  She is compliant on her medications, denies side effects.  Patient with elevated hemoglobin A1c level-dated 01/27/2021-8.2.  Discussed reducing the dosage of Abilify with the plan to taper it off given long-term adverse side effects.  Patient agrees with plan.  Patient denies any other concerns today.  Visit Diagnosis:    ICD-10-CM   1. MDD  (major depressive disorder), recurrent, in full remission ([place])  F33.42 ARIPiprazole (ABILIFY) 5 MG tablet    2. PTSD (post-traumatic stress disorder)  F43.10 gabapentin (NEURONTIN) 600 MG tablet    3. GAD (generalized anxiety disorder)  F41.1 gabapentin (NEURONTIN) 600 MG tablet    4. Insomnia due to medical condition  G47.01    Pain, mood    5. Bereavement  Z63.4     6. High risk medication use  Z79.899 Lipid panel    Prolactin      Past Psychiatric History: Reviewed past psychiatric history from progress note on 08/16/2017.  Past trials of Effexor, Klonopin  Past Medical History:  Past Medical History:  Diagnosis Date   Abdominal pain    Anxiety and depression    Atypical facial pain    Bilateral occipital neuralgia    Cervical spondylosis without myelopathy    Chronic daily headache    Chronic pain    Chronic pelvic pain in female    Chronic thoracic back pain    Depression    Diabetes mellitus without complication (HCC)    Diabetes mellitus, type II (HCC)    Dysphagia    Dysrhythmia    Fibromyalgia    Hyperlipidemia    Hypertension    Hypothyroidism    Insomnia    Iron deficiency anemia    Left hip pain    Low back pain    Migraines    Numbness    Optic neuropathy    OSA (obstructive sleep apnea)    Polyarthralgia    Primary osteoarthritis of both hips    Prothrombin gene mutation ([place])  Pseudotumor cerebri    Rectal bleeding    Right shoulder pain    Rotator cuff syndrome    Stroke [facility])    Thyroid disease    TIA (transient ischemic attack) 05/27/2017    Past Surgical History:  Procedure Laterality Date   ABDOMINAL HYSTERECTOMY     total   CHOLECYSTECTOMY     COLONOSCOPY WITH PROPOFOL N/A 03/21/2018   Procedure: COLONOSCOPY WITH PROPOFOL;  Surgeon: Lucilla Lame, MD;  Location: [facility] ENDOSCOPY;  Service: Endoscopy;  Laterality: N/A;   ERCP N/A 08/19/2020   Procedure: ENDOSCOPIC RETROGRADE CHOLANGIOPANCREATOGRAPHY (ERCP);  Surgeon: Lucilla Lame,  MD;  Location: [facility] ENDOSCOPY;  Service: Endoscopy;  Laterality: N/A;   ESOPHAGOGASTRODUODENOSCOPY (EGD) WITH PROPOFOL N/A 03/21/2018   Procedure: ESOPHAGOGASTRODUODENOSCOPY (EGD) WITH PROPOFOL;  Surgeon: Lucilla Lame, MD;  Location: [facility] ENDOSCOPY;  Service: Endoscopy;  Laterality: N/A;   GIVENS CAPSULE STUDY N/A 09/24/2019   Procedure: GIVENS CAPSULE STUDY;  Surgeon: Virgel Manifold, MD;  Location: [facility] ENDOSCOPY;  Service: Endoscopy;  Laterality: N/A;   JOINT REPLACEMENT     KNEE SURGERY Left    ROTATOR CUFF REPAIR Right    spinal neurostimulator      Family Psychiatric History: Reviewed family psychiatric history from progress note on 08/16/2017.  Family History:  Family History  Problem Relation Age of Onset   Diabetes Mother    Hyperlipidemia Mother    Hypertension Mother    COPD Mother    CVA Mother    Anemia Mother    Diabetes Father    Hyperlipidemia Father    Hypertension Father    Thyroid disease Father    Alcohol abuse Father    Peripheral vascular disease Sister    Hypertension Sister    Anxiety disorder Son    Depression Son    Bipolar disorder Son    Seizures Son     Social History: Reviewed social history from progress note on 08/16/2017. Social History   Socioeconomic History   Marital status: Divorced    Spouse name: Not on file   Number of children: 1   Years of education: Not on file   Highest education level: High school graduate  Occupational History    Comment: disablitiy  Tobacco Use   Smoking status: Never   Smokeless tobacco: Never  Vaping Use   Vaping Use: Never used  Substance and Sexual Activity   Alcohol use: No   Drug use: No   Sexual activity: Not Currently  Other Topics Concern   Not on file  Social History Narrative   Not on file   Social Determinants of Health   Financial Resource Strain: Not on file  Food Insecurity: Not on file  Transportation Needs: Not on file  Physical Activity: Not on file  Stress: Not on file   Social Connections: Not on file    Allergies:  Allergies  Allergen Reactions   Amoxapine Itching   Amoxicillin Itching   Dapagliflozin Itching    Other reaction(s): Other (See Comments) Thrush & vaginal itching   Keflex [Cephalexin] Itching   Mirtazapine Other (See Comments)    Pt states felling "like my body is outside of me."    Metabolic Disorder Labs: Lab Results  Component Value Date   HGBA1C 9.9 (H) 06/21/2017   MPG 237.43 06/21/2017   Lab Results  Component Value Date   PROLACTIN 5.5 09/06/2017   Lab Results  Component Value Date   CHOL 277 (H) 06/21/2017   TRIG 196 (H) 06/21/2017  HDL 64 06/21/2017   CHOLHDL 4.3 06/21/2017   VLDL 39 06/21/2017   LDLCALC 174 (H) 06/21/2017   Lab Results  Component Value Date   TSH 2.890 09/06/2017   TSH 3.176 01/04/2017    Therapeutic Level Labs: No results found for: LITHIUM No results found for: VALPROATE No components found for:  CBMZ  Current Medications: Current Outpatient Medications  Medication Sig Dispense Refill   Semaglutide 3 MG TABS Take by mouth.     Semaglutide 7 MG TABS Take by mouth.     ACCU-CHEK AVIVA PLUS test strip      ACCU-CHEK SOFTCLIX LANCETS lancets  (Patient not taking: Reported on 12/08/2020)     Acetaminophen (TYLENOL ARTHRITIS EXT RELIEF PO) Take 600 mg by mouth daily.     acetaZOLAMIDE (DIAMOX) 250 MG tablet  (Patient not taking: Reported on 12/08/2020)     acetaZOLAMIDE (DIAMOX) 250 MG tablet Take by mouth.     AIMOVIG 70 MG/ML SOAJ Inject 70 mg into the skin every 28 (twenty-eight) days.     albuterol (VENTOLIN HFA) 108 (90 Base) MCG/ACT inhaler Inhale 2 puffs into the lungs every 6 (six) hours as needed for wheezing or shortness of breath. 1 g 0   ARIPiprazole (ABILIFY) 5 MG tablet Take 0.5 tablets (2.5 mg total) by mouth daily. 45 tablet 0   aspirin-acetaminophen-caffeine (EXCEDRIN MIGRAINE) 250-250-65 MG tablet Take 2 tablets by mouth every 8 (eight) hours as needed for headache or  migraine.      atorvastatin (LIPITOR) 80 MG tablet Take 80 mg by mouth daily at 6 PM.     Blood Glucose Monitoring Suppl (ACCU-CHEK AVIVA PLUS) w/Device KIT      Carboxymethylcellulose Sod PF 0.5 % SOLN Apply 1 drop to eye daily as needed.     Cholecalciferol (VITAMIN D3) 1000 units CAPS Take 1 capsule by mouth daily.     clotrimazole (LOTRIMIN) 1 % cream Apply 1 application topically daily as needed.     cyclobenzaprine (FLEXERIL) 5 MG tablet Take 5 mg by mouth 3 (three) times daily as needed.      cyclobenzaprine (FLEXERIL) 5 MG tablet Take 1 tablet by mouth 3 (three) times daily as needed.     dabigatran (PRADAXA) 150 MG CAPS capsule Take 150 mg by mouth 2 (two) times daily.     diltiazem (CARDIZEM CD) 240 MG 24 hr capsule Take by mouth. Take 1 capsule by mouth once daily     diltiazem (TIAZAC) 240 MG 24 hr capsule TAKE (1) CAPSULE BY MOUTH EVERY DAY     DULoxetine (CYMBALTA) 60 MG capsule Take 1 capsule (60 mg total) by mouth 2 (two) times daily. 180 capsule 0   esomeprazole (NEXIUM) 40 MG capsule Take 40 mg by mouth daily.     famotidine (PEPCID) 40 MG tablet Take 40 mg by mouth 2 (two) times daily.     ferrous sulfate 325 (65 FE) MG tablet Take by mouth. Take 325 mg daily with breakfast     fluticasone (FLONASE) 50 MCG/ACT nasal spray Place 2 sprays into both nostrils as needed.      furosemide (LASIX) 20 MG tablet Take 20 mg by mouth daily.     gabapentin (NEURONTIN) 600 MG tablet Take 1 tablet (600 mg total) by mouth 2 (two) times daily. 180 tablet 0   hydrOXYzine (VISTARIL) 25 MG capsule TAKE 1 CAPSULE BY MOUTH TWICE DAILY AS NEEDED. FOR ANXIETY 180 capsule 0   Insulin Degludec (TRESIBA FLEXTOUCH) 200 UNIT/ML SOPN Inject  200 Units into the skin daily.     insulin lispro (HUMALOG) 100 UNIT/ML KwikPen Inject up to 50 units daily in divided doses, as directed     Insulin Pen Needle (BD ULTRA-FINE PEN NEEDLES) 29G X 12.7MM MISC Use 3 times daily as directed     lamoTRIgine (LAMICTAL) 100  MG tablet Take 100 mg by mouth daily. (Patient not taking: Reported on 12/17/2020)     lamoTRIgine (LAMICTAL) 150 MG tablet Take 150 mg by mouth daily.     levothyroxine (SYNTHROID) 112 MCG tablet Take 112 mcg by mouth daily.     levothyroxine (SYNTHROID, LEVOTHROID) 100 MCG tablet Take 100 mcg by mouth daily. (Patient not taking: No sig reported)     losartan (COZAAR) 25 MG tablet Take 12.5 mg by mouth daily.     losartan (COZAAR) 25 MG tablet Take by mouth. (Patient not taking: No sig reported)     Magnesium 250 MG TABS Take 1 tablet by mouth daily.     magnesium oxide (MAG-OX) 400 MG tablet Take 1 tablet by mouth daily.     Melatonin 10 MG TABS Take 2 tablets by mouth at bedtime.     meloxicam (MOBIC) 15 MG tablet Take 15 mg by mouth daily.     metFORMIN (GLUCOPHAGE-XR) 500 MG 24 hr tablet Take 500 mg by mouth 2 (two) times daily.      metoprolol succinate (TOPROL-XL) 25 MG 24 hr tablet Take 1 tablet by mouth daily.     naloxone (NARCAN) nasal spray 4 mg/0.1 mL 1 spray into nose for opioid overdose. If needed, may repeat spray in 2-3 min     naloxone (NARCAN) nasal spray 4 mg/0.1 mL Place into the nose.     nitroGLYCERIN (NITROSTAT) 0.4 MG SL tablet Place under the tongue.     NUCYNTA ER 100 MG 12 hr tablet Take 1 tablet by mouth 2 (two) times daily.     nystatin (MYCOSTATIN) 100000 UNIT/ML suspension Take 5 mLs by mouth 4 (four) times daily.     omeprazole (PRILOSEC) 20 MG capsule TAKE (1) CAPSULE BY MOUTH EVERY DAY 30 capsule 0   ondansetron (ZOFRAN) 4 MG tablet Take 4 mg by mouth at bedtime.     oxyCODONE (OXY IR/ROXICODONE) 5 MG immediate release tablet Take 5 mg by mouth 2 (two) times daily as needed.     pioglitazone (ACTOS) 15 MG tablet Take by mouth. Take 1 tablet by mouth once daily     quinapril (ACCUPRIL) 10 MG tablet Take 10 mg by mouth daily.     Riboflavin 400 MG TABS Take 1 tablet by mouth every night  for headaches     Rimegepant Sulfate (NURTEC) 75 MG TBDP Take 1 tablet by  mouth as needed.      traZODone (DESYREL) 100 MG tablet TAKE 1.5-2 TABLETS BY MOUTH AT BEDTIME AS NEEDED FOR SLEEP 180 tablet 0   TRESIBA FLEXTOUCH 200 UNIT/ML SOPN Inject 76 Units into the skin at bedtime.     No current facility-administered medications for this visit.     Musculoskeletal: Strength & Muscle Tone:  UTA Gait & Station:  Seated Patient leans: N/A  Psychiatric Specialty Exam: Review of Systems  Psychiatric/Behavioral:  Negative for agitation, behavioral problems, confusion, decreased concentration, dysphoric mood, hallucinations, self-injury, sleep disturbance and suicidal ideas. The patient is not nervous/anxious and is not hyperactive.   All other systems reviewed and are negative.  There were no vitals taken for this visit.There is no height or  weight on file to calculate BMI.  General Appearance: Casual  Eye Contact:  Fair  Speech:  Clear and Coherent  Volume:  Normal  Mood:  Euthymic  Affect:  Congruent  Thought Process:  Goal Directed and Descriptions of Associations: Intact  Orientation:  Full (Time, Place, and Person)  Thought Content: Logical   Suicidal Thoughts:  No  Homicidal Thoughts:  No  Memory:  Immediate;   Fair Recent;   Fair Remote;   Fair  Judgement:  Fair  Insight:  Fair  Psychomotor Activity:  Normal  Concentration:  Concentration: Fair and Attention Span: Fair  Recall:  [employer] of Knowledge: Fair  Language: Fair  Akathisia:  No  Handed:  Right  AIMS (if indicated): done, 0  Assets:  Communication Skills Desire for Improvement Housing Social Support  ADL's:  Intact  Cognition: WNL  Sleep:  Fair   Screenings: GAD-7    Flowsheet Row Video Visit from 01/22/2021 in [place] from 01/19/2021 in [facility] [place]  Total GAD-7 Score 2 21      PHQ2-9    [place] Video Visit from 01/22/2021 in [place] from 01/19/2021 in  [place] Video Visit from 12/08/2020 in [place] Video Visit from 08/04/2020 in [place] Video Visit from 07/07/2020 in [place]  PHQ-2 Total Score _0 PHQ-9 Total Score _1 -- --      Flowsheet Row Counselor from 03/23/2021 in [place] Video Visit from 01/22/2021 in [facility] from 01/19/2021 in [place] Low Risk Low Risk No Risk        Assessment and Plan: Christine Mcneil is a 55 year old Caucasian female who has a history of MDD, PTSD, chronic pain, migraine headache, history of CVA, iron deficiency, prothrombin gene mutation was evaluated by telemedicine today.  Patient with elevated hemoglobin A1c level, will benefit from being tapered off of the Abilify due to risk of long-term side effects.  Discussed plan as noted below.  Plan MDD in remission Cymbalta 120 mg p.o. daily in divided dosage Lamotrigine 125 mg p.o. daily Reduce Abilify to 2.5 mg p.o. daily.  Long-term plan is to taper it off.  Patient to monitor her mood symptoms closely.  GAD-stable Cymbalta as prescribed Hydroxyzine 25 mg p.o. daily as needed for severe anxiety attacks  Insomnia-stable Trazodone 150-200 mg p.o. nightly Melatonin 3 to 9 mg p.o. nightly Hydroxyzine 25 mg as needed for sleep  High risk medication use-will order lipid panel, prolactin level.  Patient to go to [facility].  Follow-up in clinic in 4 weeks or sooner in person.  This note was generated in part or whole with voice recognition software. Voice recognition is usually quite accurate but there are transcription errors that can and very often do occur. I apologize for any typographical errors that were not detected and corrected.       Ursula Alert, MD 04/28/2021, 1:12 PM

## 2021-04-29 ENCOUNTER — Other Ambulatory Visit: Payer: Self-pay | Admitting: Psychiatry

## 2021-04-29 DIAGNOSIS — F4310 Post-traumatic stress disorder, unspecified: Secondary | ICD-10-CM

## 2021-05-26 ENCOUNTER — Telehealth: Payer: Self-pay

## 2021-05-26 ENCOUNTER — Ambulatory Visit (INDEPENDENT_AMBULATORY_CARE_PROVIDER_SITE_OTHER): Payer: 59 | Admitting: Psychiatry

## 2021-05-26 ENCOUNTER — Other Ambulatory Visit: Payer: Self-pay

## 2021-05-26 ENCOUNTER — Encounter: Payer: Self-pay | Admitting: Oncology

## 2021-05-26 ENCOUNTER — Encounter: Payer: Self-pay | Admitting: Psychiatry

## 2021-05-26 VITALS — BP 137/96 | HR 94 | Temp 98.3°F | Wt 249.8 lb

## 2021-05-26 DIAGNOSIS — G4701 Insomnia due to medical condition: Secondary | ICD-10-CM | POA: Diagnosis not present

## 2021-05-26 DIAGNOSIS — Z79899 Other long term (current) drug therapy: Secondary | ICD-10-CM

## 2021-05-26 DIAGNOSIS — F4310 Post-traumatic stress disorder, unspecified: Secondary | ICD-10-CM

## 2021-05-26 DIAGNOSIS — F411 Generalized anxiety disorder: Secondary | ICD-10-CM | POA: Diagnosis not present

## 2021-05-26 DIAGNOSIS — Z634 Disappearance and death of family member: Secondary | ICD-10-CM

## 2021-05-26 DIAGNOSIS — F331 Major depressive disorder, recurrent, moderate: Secondary | ICD-10-CM

## 2021-05-26 MED ORDER — DULOXETINE HCL 60 MG PO CPEP: 60.0000 mg | ORAL_CAPSULE | Freq: Two times a day (BID) | ORAL | 0 refills | Status: DC

## 2021-05-26 MED ORDER — ARIPIPRAZOLE 5 MG PO TABS: 5.0000 mg | ORAL_TABLET | Freq: Every day | ORAL | 0 refills | Status: DC

## 2021-05-26 MED ORDER — BELSOMRA 5 MG PO TABS: 5.0000 mg | ORAL_TABLET | Freq: Every day | ORAL | 1 refills | Status: DC

## 2021-05-26 NOTE — Telephone Encounter (Signed)
Please find out why pharmacy does not have this particular medication. Please ask her to check with another pharmacy and we could send script there .  Thank you

## 2021-05-26 NOTE — Patient Instructions (Signed)
Suvorexant Tablets °What is this medication? °SUVOREXANT (SOO voe REX ant) treats insomnia. It helps you go to sleep faster and stay asleep through the night. °This medicine may be used for other purposes; ask your health care provider or pharmacist if you have questions. °COMMON BRAND NAME(S): Belsomra °What should I tell my care team before I take this medication? °They need to know if you have any of these conditions: °Depression °History of substance abuse or addiction °History of a sudden onset of muscle weakness (cataplexy) °History of falling asleep often at unexpected times (narcolepsy) °If you often drink alcohol °Liver disease °Lung or breathing disease °Sleep apnea °Sleep-walking, driving, eating or other activity while not fully awake after taking a sleep medication °Suicidal thoughts, plans, or attempt; a previous suicide attempt by you or a family member °An unusual or allergic reaction to suvorexant, other medications, foods, dyes, or preservatives °Pregnant or trying to get pregnant °Breast-feeding °How should I use this medication? °Take this medication by mouth with water 30 minutes before going to bed. Follow the directions on the prescription label. It is better to take this medication on an empty stomach. Do not take your medication more often than directed. °A special MedGuide will be given to you by the pharmacist with each prescription and refill. Be sure to read this information carefully each time. °Talk to your care team about the use of this medication in children. Special care may be needed. °Overdosage: If you think you have taken too much of this medicine contact a poison control center or emergency room at once. °NOTE: This medicine is only for you. Do not share this medicine with others. °What if I miss a dose? °This does not apply. This medication should only be taken as directed before going to sleep. Do not take double or extra doses. °What may interact with this  medication? °Alcohol °Antihistamines for allergy, cough, or cold °Certain antibiotics, such as erythromycin or clarithromycin °Certain antivirals for HIV or hepatitis °Certain medications for fungal infections, such as itraconazole, ketoconazole, posaconazole °Certain medications for mental health conditions °Certain medications for seizures, such as carbamazepine, phenytoin, phenobarbital °Conivaptan °Diltiazem °General anesthetics, such as halothane, isoflurane, methoxyflurane, propofol °Grapefruit juice °Medications that relax muscles for surgery °Opioid medications for pain °Other medications for sleep °Rifampin °St. John's Wort °Verapamil °This list may not describe all possible interactions. Give your health care provider a list of all the medicines, herbs, non-prescription drugs, or dietary supplements you use. Also tell them if you smoke, drink alcohol, or use illegal drugs. Some items may interact with your medicine. °What should I watch for while using this medication? °Visit your care team for regular checks on your progress. Keep a regular sleep schedule by going to bed at about the same time each night. Avoid caffeine-containing drinks in the evening hours. Talk to your care team if your insomnia worsens or is not better within 7 to 10 days. °After taking this medication, you may get up out of bed and do an activity that you do not know you are doing. The next morning, you may have no memory of this. Activities include driving a car ("sleep-driving"), making and eating food, talking on the phone, sexual activity, and sleep-walking. Serious injuries have occurred. Stop the medication and call your care team right away if you find out you have done any of these activities. Do not take this medication if you have used alcohol that evening. Do not take it if you have taken another   medication for sleep. The risk of doing these sleep-related activities is higher. °Do not take this medication unless you are  able to stay in bed for a full night (7 to 8 hours) before you must be active again. Tell your care team if you will need to perform activities requiring full alertness, such as driving, the next day. You may have a decrease in mental alertness the day after use, even if you feel that you are fully awake. Do not stand or sit up quickly after taking this medication, especially if you are an older patient. This reduces the risk of dizzy or fainting spells. °If you or your family notice any changes in your moods or behavior, such as new or worsening depression, thoughts of harming yourself, anxiety, other unusual or disturbing thoughts, or memory loss, call your care team right away. °After you stop taking this medication, you may have trouble falling asleep. This is called rebound insomnia. This problem usually goes away on its own after 1 or 2 nights. °What side effects may I notice from receiving this medication? °Side effects that you should report to your care team as soon as possible: °Allergic reactions--skin rash, itching, hives, swelling of the face, lips, tongue, or throat °CNS depression--slow or shallow breathing, shortness of breath, feeling faint, dizziness, confusion, trouble staying awake °Mood and behavior changes--anxiety, nervousness, confusion, hallucinations, irritability, hostility, thoughts of suicide or self-harm, worsening mood, feelings of depression °Sudden and temporary muscle weakness °Unable to move or speak for several minutes upon waking or going to sleep °Unusual sleep behaviors or activities you do not remember, such as driving, eating, or sexual activity °Side effects that usually do not require medical attention (report these to your care team if they continue or are bothersome): °Drowsiness the day after use °Vivid dreams or nightmares °This list may not describe all possible side effects. Call your doctor for medical advice about side effects. You may report side effects to FDA at  1-800-FDA-1088. °Where should I keep my medication? °Keep out of the reach of children and pets. This medication can be abused. Keep it in a safe place to protect it from theft. Do not share it with anyone. It is only for you. Selling or giving away this medication is dangerous and against the law. °Store between 20 and 25 degrees C (68 and 77 degrees F). Protect from light and moisture. Keep the container tightly closed. Get rid of any unused medication after the expiration date. °This medication may cause harm and death if it is taken by other adults, children, or pets. It is important to get rid of the medication as soon as you no longer need it or it is expired. You can do this in two ways: °Take the medication to a medication take-back program. Check with your pharmacy or law enforcement to find a location. °If you cannot return the medication, check the label or package insert to see if the medication should be thrown out in the garbage or flushed down the toilet. If you are not sure, ask your care team. If it is safe to put it in the trash, take the medication out of the container. Mix the medication with cat litter, dirt, coffee grounds, or other unwanted substance. Seal the mixture in a bag or container. Put it in the trash. °NOTE: This sheet is a summary. It may not cover all possible information. If you have questions about this medicine, talk to your doctor, pharmacist, or health care provider. °©   2022 [employer] (2021-01-01 00:00:00)

## 2021-05-26 NOTE — Telephone Encounter (Signed)
pt called left message that the pharmacy does not have the new sleeping medicaiton you wanted her to take. is there something else she can take.

## 2021-05-26 NOTE — Progress Notes (Signed)
BH MD OP Progress Note ° °05/26/2021 3:20 PM °Christine Mcneil  °MRN:  [id number] ° °Chief Complaint:  °Chief Complaint   °Follow-up; Depression; Anxiety °  ° °HPI: Christine Mcneil is a 56-year-old Caucasian female, divorced, lives in [place], has a history of MDD, PTSD, insomnia, OSA-resolved, history of CVA, history of prothrombin gene mutation, chronic pain, migraine headache, iron deficiency was evaluated in office today. ° °Patient reports she did not tolerate coming off of the Abilify and had to go back on the 5 mg.  She started having mood lability, anger outbursts when she tried to do that.  In spite of going back on the Abilify a week ago she continues to have sadness, low motivation, concentration problems, low energy.  She is currently on Cymbalta and multiple other mood stabilizers like Lamictal and gabapentin which is prescribed for other reasons by her other providers. ° °Patient reports sleep as restless.  She reports she gets only around 4 hours of sleep in spite of being on the higher dosage of trazodone.  She has been having nightmares and that also has an effect on her sleep. ° °She denies any suicidality, homicidality or perceptual disturbances. ° °She does have situational stressors, reports she is the primary caregiver for her mother and it is overwhelming for her.  She does continue to follow-up with her therapist which is beneficial. ° °Patient denies any other concerns today. ° °Visit Diagnosis:  °  ICD-10-CM   °1. MDD (major depressive disorder), recurrent episode, moderate (HCC)  F33.1 ARIPiprazole (ABILIFY) 5 MG tablet  °  °2. PTSD (post-traumatic stress disorder)  F43.10   °  °3. GAD (generalized anxiety disorder)  F41.1   °  °4. Insomnia due to medical condition  G47.01 Suvorexant (BELSOMRA) 5 MG TABS  ° pain,mood  °  °5. Bereavement  Z63.4 DULoxetine (CYMBALTA) 60 MG capsule  °  °6. High risk medication use  Z79.899   °  ° ° °Past Psychiatric History: I have reviewed past psychiatric history from  progress note on 08/16/2017.  Past trials of Effexor, Klonopin. ° °Past Medical History:  °Past Medical History:  °Diagnosis Date  ° Abdominal pain   ° Anxiety and depression   ° Atypical facial pain   ° Bilateral occipital neuralgia   ° Cervical spondylosis without myelopathy   ° Chronic daily headache   ° Chronic pain   ° Chronic pelvic pain in female   ° Chronic thoracic back pain   ° Depression   ° Diabetes mellitus without complication (HCC)   ° Diabetes mellitus, type II (HCC)   ° Dysphagia   ° Dysrhythmia   ° Fibromyalgia   ° Hyperlipidemia   ° Hypertension   ° Hypothyroidism   ° Insomnia   ° Iron deficiency anemia   ° Left hip pain   ° Low back pain   ° Migraines   ° Numbness   ° Optic neuropathy   ° OSA (obstructive sleep apnea)   ° Polyarthralgia   ° Primary osteoarthritis of both hips   ° Prothrombin gene mutation (HCC)   ° Pseudotumor cerebri   ° Rectal bleeding   ° Right shoulder pain   ° Rotator cuff syndrome   ° Stroke (HCC)   ° Thyroid disease   ° TIA (transient ischemic attack) 05/27/2017  °  °Past Surgical History:  °Procedure Laterality Date  ° ABDOMINAL HYSTERECTOMY    ° total  ° CHOLECYSTECTOMY    ° COLONOSCOPY WITH PROPOFOL N/A   03/21/2018   Procedure: COLONOSCOPY WITH PROPOFOL;  Surgeon: Lucilla Lame, MD;  Location: [facility] ENDOSCOPY;  Service: Endoscopy;  Laterality: N/A;   ERCP N/A 08/19/2020   Procedure: ENDOSCOPIC RETROGRADE CHOLANGIOPANCREATOGRAPHY (ERCP);  Surgeon: Lucilla Lame, MD;  Location[facility] ENDOSCOPY;  Service: Endoscopy;  Laterality: N/A;   ESOPHAGOGASTRODUODENOSCOPY (EGD) WITH PROPOFOL N/A 03/21/2018   Procedure: ESOPHAGOGASTRODUODENOSCOPY (EGD) WITH PROPOFOL;  Surgeon: Lucilla Lame, MD;  Location: [facility] ENDOSCOPY;  Service: Endoscopy;  Laterality: N/A;   GIVENS CAPSULE STUDY N/A 09/24/2019   Procedure: GIVENS CAPSULE STUDY;  Surgeon: Virgel Manifold, MD;  Location: [facility] ENDOSCOPY;  Service: Endoscopy;  Laterality: N/A;   JOINT REPLACEMENT     KNEE SURGERY Left    ROTATOR  CUFF REPAIR Right    spinal neurostimulator      Family Psychiatric History: Reviewed family psychiatric history from progress note on 08/16/2017.  Family History:  Family History  Problem Relation Age of Onset   Diabetes Mother    Hyperlipidemia Mother    Hypertension Mother    COPD Mother    CVA Mother    Anemia Mother    Diabetes Father    Hyperlipidemia Father    Hypertension Father    Thyroid disease Father    Alcohol abuse Father    Peripheral vascular disease Sister    Hypertension Sister    Anxiety disorder Son    Depression Son    Bipolar disorder Son    Seizures Son     Social History: Reviewed social history from progress note on 08/16/2017. Social History   Socioeconomic History   Marital status: Divorced    Spouse name: Not on file   Number of children: 1   Years of education: Not on file   Highest education level: High school graduate  Occupational History    Comment: disablitiy  Tobacco Use   Smoking status: Never   Smokeless tobacco: Never  Vaping Use   Vaping Use: Never used  Substance and Sexual Activity   Alcohol use: No   Drug use: No   Sexual activity: Not Currently  Other Topics Concern   Not on file  Social History Narrative   Not on file   Social Determinants of Health   Financial Resource Strain: Not on file  Food Insecurity: Not on file  Transportation Needs: Not on file  Physical Activity: Not on file  Stress: Not on file  Social Connections: Not on file    Allergies:  Allergies  Allergen Reactions   Amoxapine Itching   Amoxicillin Itching   Dapagliflozin Itching    Other reaction(s): Other (See Comments) Thrush & vaginal itching   Keflex [Cephalexin] Itching   Mirtazapine Other (See Comments)    Pt states felling "like my body is outside of me."    Metabolic Disorder Labs: Lab Results  Component Value Date   HGBA1C 9.9 (H) 06/21/2017   MPG 237.43 06/21/2017   Lab Results  Component Value Date   PROLACTIN 5.5  09/06/2017   Lab Results  Component Value Date   CHOL 277 (H) 06/21/2017   TRIG 196 (H) 06/21/2017   HDL 64 06/21/2017   CHOLHDL 4.3 06/21/2017   VLDL 39 06/21/2017   LDLCALC 174 (H) 06/21/2017   Lab Results  Component Value Date   TSH 2.890 09/06/2017   TSH 3.176 01/04/2017    Therapeutic Level Labs: No results found for: LITHIUM No results found for: VALPROATE No components found for:  CBMZ  Current Medications: Current Outpatient Medications  Medication Sig Dispense Refill   ACCU-CHEK AVIVA PLUS test strip      ACCU-CHEK SOFTCLIX LANCETS lancets      Acetaminophen (TYLENOL ARTHRITIS EXT RELIEF PO) Take 600 mg by mouth daily.     acetaZOLAMIDE (DIAMOX) 250 MG tablet      acetaZOLAMIDE (DIAMOX) 250 MG tablet Take by mouth.     AIMOVIG 70 MG/ML SOAJ Inject 70 mg into the skin every 28 (twenty-eight) days.     albuterol (VENTOLIN HFA) 108 (90 Base) MCG/ACT inhaler Inhale 2 puffs into the lungs every 6 (six) hours as needed for wheezing or shortness of breath. 1 g 0   aspirin-acetaminophen-caffeine (EXCEDRIN MIGRAINE) 633-354-56 MG tablet Take 2 tablets by mouth every 8 (eight) hours as needed for headache or migraine.      atorvastatin (LIPITOR) 80 MG tablet Take 80 mg by mouth daily at 6 PM.     Blood Glucose Monitoring Suppl (ACCU-CHEK AVIVA PLUS) w/Device KIT      Carboxymethylcellulose Sod PF 0.5 % SOLN Apply 1 drop to eye daily as needed.     Cholecalciferol (VITAMIN D3) 1000 units CAPS Take 1 capsule by mouth daily.     clotrimazole (LOTRIMIN) 1 % cream Apply 1 application topically daily as needed.     cyclobenzaprine (FLEXERIL) 5 MG tablet Take 5 mg by mouth 3 (three) times daily as needed.      cyclobenzaprine (FLEXERIL) 5 MG tablet Take 1 tablet by mouth 3 (three) times daily as needed.     diltiazem (TIAZAC) 240 MG 24 hr capsule TAKE (1) CAPSULE BY MOUTH EVERY DAY     esomeprazole (NEXIUM) 40 MG capsule Take 40 mg by mouth daily.     famotidine (PEPCID) 40 MG  tablet Take 40 mg by mouth 2 (two) times daily.     ferrous sulfate 325 (65 FE) MG tablet Take by mouth. Take 325 mg daily with breakfast     fluticasone (FLONASE) 50 MCG/ACT nasal spray Place 2 sprays into both nostrils as needed.      gabapentin (NEURONTIN) 600 MG tablet Take 1 tablet (600 mg total) by mouth 2 (two) times daily. 180 tablet 0   hydrOXYzine (VISTARIL) 25 MG capsule TAKE 1 CAPSULE BY MOUTH TWICE DAILY AS NEEDED. FOR ANXIETY 180 capsule 0   Insulin Degludec (TRESIBA FLEXTOUCH) 200 UNIT/ML SOPN Inject 200 Units into the skin daily.     insulin lispro (HUMALOG) 100 UNIT/ML KwikPen Inject up to 50 units daily in divided doses, as directed     Insulin Pen Needle (BD ULTRA-FINE PEN NEEDLES) 29G X 12.7MM MISC Use 3 times daily as directed     lamoTRIgine (LAMICTAL) 100 MG tablet Take 100 mg by mouth daily.     lamoTRIgine (LAMICTAL) 150 MG tablet Take 150 mg by mouth daily.     levothyroxine (SYNTHROID) 112 MCG tablet Take 112 mcg by mouth daily.     levothyroxine (SYNTHROID, LEVOTHROID) 100 MCG tablet Take 100 mcg by mouth daily.     losartan (COZAAR) 25 MG tablet Take 12.5 mg by mouth daily.     losartan (COZAAR) 25 MG tablet Take by mouth.     Magnesium 250 MG TABS Take 1 tablet by mouth daily.     magnesium oxide (MAG-OX) 400 MG tablet Take 1 tablet by mouth daily.     Melatonin 10 MG TABS Take 2 tablets by mouth at bedtime.     meloxicam (MOBIC) 15 MG tablet Take 15 mg by mouth daily.  metFORMIN (GLUCOPHAGE-XR) 500 MG 24 hr tablet Take 500 mg by mouth 2 (two) times daily.     ° metFORMIN (GLUCOPHAGE-XR) 500 MG 24 hr tablet Take by mouth.    ° metoprolol succinate (TOPROL-XL) 25 MG 24 hr tablet Take 1 tablet by mouth daily.    ° naloxone (NARCAN) nasal spray 4 mg/0.1 mL 1 spray into nose for opioid overdose. If needed, may repeat spray in 2-3 min    ° naloxone (NARCAN) nasal spray 4 mg/0.1 mL Place into the nose.    ° nitroGLYCERIN (NITROSTAT) 0.4 MG SL tablet Place under the  tongue.    ° NUCYNTA ER 100 MG 12 hr tablet Take 1 tablet by mouth 2 (two) times daily.    ° nystatin (MYCOSTATIN) 100000 UNIT/ML suspension Take 5 mLs by mouth 4 (four) times daily.    ° omeprazole (PRILOSEC) 20 MG capsule TAKE (1) CAPSULE BY MOUTH EVERY DAY 30 capsule 0  ° ondansetron (ZOFRAN) 4 MG tablet Take 4 mg by mouth at bedtime.    ° oxyCODONE (OXY IR/ROXICODONE) 5 MG immediate release tablet Take 5 mg by mouth 2 (two) times daily as needed.    ° quinapril (ACCUPRIL) 10 MG tablet Take 10 mg by mouth daily.    ° Riboflavin 400 MG TABS Take 1 tablet by mouth every night  for headaches    ° Rimegepant Sulfate (NURTEC) 75 MG TBDP Take 1 tablet by mouth as needed.     ° Semaglutide 3 MG TABS Take by mouth.    ° Semaglutide 7 MG TABS Take by mouth.    ° Suvorexant (BELSOMRA) 5 MG TABS Take 5 mg by mouth at bedtime. 10 tablet 1  ° traZODone (DESYREL) 100 MG tablet TAKE 1.5-2 TABLETS BY MOUTH AT BEDTIME AS NEEDED FOR SLEEP 180 tablet 0  ° TRESIBA FLEXTOUCH 200 UNIT/ML SOPN Inject 76 Units into the skin at bedtime.    ° ARIPiprazole (ABILIFY) 5 MG tablet Take 1 tablet (5 mg total) by mouth daily. 30 tablet 0  ° dabigatran (PRADAXA) 150 MG CAPS capsule Take 150 mg by mouth 2 (two) times daily.    ° diltiazem (CARDIZEM CD) 240 MG 24 hr capsule Take by mouth. Take 1 capsule by mouth once daily    ° DULoxetine (CYMBALTA) 60 MG capsule Take 1 capsule (60 mg total) by mouth 2 (two) times daily. 60 capsule 0  ° furosemide (LASIX) 20 MG tablet Take 20 mg by mouth daily.    ° mometasone (ELOCON) 0.1 % cream Apply topically.    ° pioglitazone (ACTOS) 15 MG tablet Take by mouth. Take 1 tablet by mouth once daily    ° °No current facility-administered medications for this visit.  ° ° ° °Musculoskeletal: °Strength & Muscle Tone: within normal limits °Gait & Station: normal °Patient leans: N/A ° °Psychiatric Specialty Exam: °Review of Systems  °Psychiatric/Behavioral:  Positive for decreased concentration, dysphoric mood and  sleep disturbance. The patient is nervous/anxious.   °All other systems reviewed and are negative.  °Blood pressure (!) 137/96, pulse 94, temperature 98.3 °F (36.8 °C), temperature source Temporal, weight 249 lb 12.8 oz (113.3 kg).Body mass index is 40.32 kg/m².  °General Appearance: Casual  °Eye Contact:  Good  °Speech:  Clear and Coherent  °Volume:  Normal  °Mood:  Anxious and Depressed  °Affect:  Congruent  °Thought Process:  Goal Directed and Descriptions of Associations: Intact  °Orientation:  Full (Time, Place, and Person)  °Thought Content: Logical   °Suicidal Thoughts:    No  °Homicidal Thoughts:  No  °Memory:  Immediate;   Fair °Recent;   Fair °Remote;   Fair  °Judgement:  Fair  °Insight:  Fair  °Psychomotor Activity:  Normal  °Concentration:  Concentration: Fair and Attention Span: Fair  °Recall:  Fair  °Fund of Knowledge: Fair  °Language: Fair  °Akathisia:  No  °Handed:  Right  °AIMS (if indicated): done, 0  °Assets:  Communication Skills °Desire for Improvement °Housing °Social Support  °ADL's:  Intact  °Cognition: WNL  °Sleep:  Poor  ° °Screenings: °GAD-7   ° °Flowsheet Row Office Visit from 05/26/2021 in [facility] Video Visit from 01/22/2021 in [facility] Counselor from 01/19/2021 in [facility]  °Total GAD-7 Score 17 2 21  ° °  ° °PHQ2-9   ° °Flowsheet Row Office Visit from 05/26/2021 in [facility] Video Visit from 01/22/2021 in [facility] Counselor from 01/19/2021 in [facility] Video Visit from 12/08/2020 in [facility] Video Visit from 08/04/2020 in [facility]  °PHQ-2 Total Score 1 1 5 5 1  °PHQ-9 Total Score 7 2 18 10 --  ° °  ° °Flowsheet Row Office Visit from 05/26/2021 in [facility] Counsel[facility]  °C-SSRS RISK CATEGORY No Risk Low Risk Low Risk  ° °  ° ° ° °Assessment and Plan: Christine Mcneil is a 56-year-old Caucasian female who has a history of MDD, PTSD, chronic pain, migraine headache, history of CVA, iron deficiency, prothrombin gene mutation was evaluated in office today.  Patient continues to struggle with mood, sleep problems, will benefit from the following plan. ° °Plan °MDD-unstable °Cymbalta 120 mg p.o. daily in divided dosage °Lamotrigine 125 mg p.o. daily °Continue Abilify 5 mg p.o. daily-patient did not tolerate being tapered off and had to go back on it.  Attempted the tapering off since her hemoglobin A1c was high. ° °GAD-unstable °We will consider changing her Cymbalta to another SSRI or SNRI. °However will give the Abilify more time since she just went back on it. °Hydroxyzine 25 mg p.o. daily for severe anxiety attacks °Continue CBT ° °Insomnia-unstable °Discontinue trazodone for lack of benefit °Start Belsomra 5 mg p.o. nightly °Continue melatonin 3 to 9 mg manage nightly °Provided medication education including drug to drug interaction with oxycodone. ° °High risk medication use-patient has been noncompliant.  Will order lipid panel, prolactin level again.  Patient provided lab slip. ° °Follow-up in clinic in 1 week or sooner if needed. ° °This note was generated in part or whole with voice recognition software. Voice recognition is usually quite accurate but there are transcription errors that can and very often do occur. I apologize for any typographical errors that were not detected and corrected. ° ° ° ° ° ° ° , MD °05/26/2021, 3:20 PM ° °

## 2021-05-27 LAB — LIPID PANEL
Chol/HDL Ratio: 4.9 ratio — ABNORMAL HIGH (ref 0.0–4.4)
Cholesterol, Total: 157 mg/dL (ref 100–199)
HDL: 32 mg/dL — ABNORMAL LOW (ref >39)
LDL Chol Calc (NIH): 84 mg/dL (ref 0–99)
Triglycerides: 245 mg/dL — ABNORMAL HIGH (ref 0–149)
VLDL Cholesterol Cal: 41 mg/dL — ABNORMAL HIGH (ref 5–40)

## 2021-05-27 LAB — PROLACTIN: Prolactin: 4.3 ng/mL — ABNORMAL LOW (ref 4.8–23.3)

## 2021-05-29 ENCOUNTER — Telehealth: Payer: Self-pay | Admitting: Psychiatry

## 2021-05-29 NOTE — Telephone Encounter (Signed)
Contacted patient, discussed abnormal lipid panel, discussed prolactin level.  Patient will follow up with primary care provider.

## 2021-06-02 ENCOUNTER — Encounter: Payer: Self-pay | Admitting: Psychiatry

## 2021-06-02 ENCOUNTER — Telehealth (INDEPENDENT_AMBULATORY_CARE_PROVIDER_SITE_OTHER): Payer: 59 | Admitting: Psychiatry

## 2021-06-02 ENCOUNTER — Other Ambulatory Visit: Payer: Self-pay

## 2021-06-02 DIAGNOSIS — F331 Major depressive disorder, recurrent, moderate: Secondary | ICD-10-CM | POA: Diagnosis not present

## 2021-06-02 DIAGNOSIS — Z634 Disappearance and death of family member: Secondary | ICD-10-CM

## 2021-06-02 DIAGNOSIS — G4701 Insomnia due to medical condition: Secondary | ICD-10-CM

## 2021-06-02 DIAGNOSIS — F411 Generalized anxiety disorder: Secondary | ICD-10-CM | POA: Diagnosis not present

## 2021-06-02 DIAGNOSIS — F4310 Post-traumatic stress disorder, unspecified: Secondary | ICD-10-CM | POA: Diagnosis not present

## 2021-06-02 DIAGNOSIS — Z79899 Other long term (current) drug therapy: Secondary | ICD-10-CM

## 2021-06-02 NOTE — Progress Notes (Signed)
Virtual Visit via Video Note  I connected with Christine Mcneil on 06/02/21 at 11:40 AM EST by a video enabled telemedicine application and verified that I am speaking with the correct person using two identifiers.  Location Provider Location : [facility] Patient Location : Home  Participants: Patient , Provider    I discussed the limitations of evaluation and management by telemedicine and the availability of in person appointments. The patient expressed understanding and agreed to proceed.   I discussed the assessment and treatment plan with the patient. The patient was provided an opportunity to ask questions and all were answered. The patient agreed with the plan and demonstrated an understanding of the instructions.   The patient was advised to call back or seek an in-person evaluation if the symptoms worsen or if the condition fails to improve as anticipated.   [facility] MD OP Progress Note  06/02/2021 5:35 PM [name]  MRN:  [id number]  Chief Complaint:  Chief Complaint   Follow-up; Anxiety; Depression    HPI: Christine Mcneil is 56 year old Caucasian female, divorced, lives in [place], has a history of MDD, PTSD, insomnia, OSA-resolved, history of CVA, history of prothrombin gene mutation, chronic pain, migraine headache, iron deficiency was evaluated by telemedicine today.  Patient today reports she continues to struggle with sleep.  She was unable to get her Belsomra medication which was recently started few days ago.  She reports her pharmacy did not carry it.  Patient agrees to check with her and the pharmacy and let writer know.  Patient reports not having enough sleep does have an impact on her mood.  She however continues to be on her medications like Cymbalta, Lamictal, gabapentin as well as Abilify and is managing her mood symptoms okay.  Denies side effects to her medications.  Denies any suicidality, homicidality or perceptual disturbances.  Denies any other concerns  today.  Visit Diagnosis:    ICD-10-CM   1. MDD (major depressive disorder), recurrent episode, moderate (HCC)  F33.1     2. PTSD (post-traumatic stress disorder)  F43.10     3. GAD (generalized anxiety disorder)  F41.1     4. Insomnia due to medical condition  G47.01    Pain, mood    5. Bereavement  Z63.4     6. High risk medication use  Z79.899       Past Psychiatric History: Reviewed past psychiatric history from progress note on 08/16/2017.  Past trials of Effexor, Klonopin.  Past Medical History:  Past Medical History:  Diagnosis Date   Abdominal pain    Anxiety and depression    Atypical facial pain    Bilateral occipital neuralgia    Cervical spondylosis without myelopathy    Chronic daily headache    Chronic pain    Chronic pelvic pain in female    Chronic thoracic back pain    Depression    Diabetes mellitus without complication (HCC)    Diabetes mellitus, type II (HCC)    Dysphagia    Dysrhythmia    Fibromyalgia    Hyperlipidemia    Hypertension    Hypothyroidism    Insomnia    Iron deficiency anemia    Left hip pain    Low back pain    Migraines    Numbness    Optic neuropathy    OSA (obstructive sleep apnea)    Polyarthralgia    Primary osteoarthritis of both hips    Prothrombin gene mutation ([place])  Pseudotumor cerebri    Rectal bleeding    Right shoulder pain    Rotator cuff syndrome    Stroke [facility])    Thyroid disease    TIA (transient ischemic attack) 05/27/2017    Past Surgical History:  Procedure Laterality Date   ABDOMINAL HYSTERECTOMY     total   CHOLECYSTECTOMY     COLONOSCOPY WITH PROPOFOL N/A 03/21/2018   Procedure: COLONOSCOPY WITH PROPOFOL;  Surgeon: Lucilla Lame, MD;  Location: [facility] ENDOSCOPY;  Service: Endoscopy;  Laterality: N/A;   ERCP N/A 08/19/2020   Procedure: ENDOSCOPIC RETROGRADE CHOLANGIOPANCREATOGRAPHY (ERCP);  Surgeon: Lucilla Lame, MD;  Location: [facility] ENDOSCOPY;  Service: Endoscopy;  Laterality: N/A;    ESOPHAGOGASTRODUODENOSCOPY (EGD) WITH PROPOFOL N/A 03/21/2018   Procedure: ESOPHAGOGASTRODUODENOSCOPY (EGD) WITH PROPOFOL;  Surgeon: Lucilla Lame, MD;  Location: [facility] ENDOSCOPY;  Service: Endoscopy;  Laterality: N/A;   GIVENS CAPSULE STUDY N/A 09/24/2019   Procedure: GIVENS CAPSULE STUDY;  Surgeon: Virgel Manifold, MD;  Location: [facility] ENDOSCOPY;  Service: Endoscopy;  Laterality: N/A;   JOINT REPLACEMENT     KNEE SURGERY Left    ROTATOR CUFF REPAIR Right    spinal neurostimulator      Family Psychiatric History: Reviewed family psychiatric history from progress note on 08/16/2017.  Family History:  Family History  Problem Relation Age of Onset   Diabetes Mother    Hyperlipidemia Mother    Hypertension Mother    COPD Mother    CVA Mother    Anemia Mother    Diabetes Father    Hyperlipidemia Father    Hypertension Father    Thyroid disease Father    Alcohol abuse Father    Peripheral vascular disease Sister    Hypertension Sister    Anxiety disorder Son    Depression Son    Bipolar disorder Son    Seizures Son     Social History: Reviewed social history from progress note on 08/16/2017. Social History   Socioeconomic History   Marital status: Divorced    Spouse name: Not on file   Number of children: 1   Years of education: Not on file   Highest education level: High school graduate  Occupational History    Comment: disablitiy  Tobacco Use   Smoking status: Never   Smokeless tobacco: Never  Vaping Use   Vaping Use: Never used  Substance and Sexual Activity   Alcohol use: No   Drug use: No   Sexual activity: Not Currently  Other Topics Concern   Not on file  Social History Narrative   Not on file   Social Determinants of Health   Financial Resource Strain: Not on file  Food Insecurity: Not on file  Transportation Needs: Not on file  Physical Activity: Not on file  Stress: Not on file  Social Connections: Not on file    Allergies:  Allergies   Allergen Reactions   Amoxapine Itching   Amoxicillin Itching   Dapagliflozin Itching    Other reaction(s): Other (See Comments) Thrush & vaginal itching   Keflex [Cephalexin] Itching   Mirtazapine Other (See Comments)    Pt states felling "like my body is outside of me."    Metabolic Disorder Labs: Lab Results  Component Value Date   HGBA1C 9.9 (H) 06/21/2017   MPG 237.43 06/21/2017   Lab Results  Component Value Date   PROLACTIN 4.3 (L) 05/26/2021   PROLACTIN 5.5 09/06/2017   Lab Results  Component Value Date   CHOL 157 05/26/2021  TRIG 245 (H) 05/26/2021   HDL 32 (L) 05/26/2021   CHOLHDL 4.9 (H) 05/26/2021   VLDL 39 06/21/2017   LDLCALC 84 05/26/2021   LDLCALC 174 (H) 06/21/2017   Lab Results  Component Value Date   TSH 2.890 09/06/2017   TSH 3.176 01/04/2017    Therapeutic Level Labs: No results found for: LITHIUM No results found for: VALPROATE No components found for:  CBMZ  Current Medications: Current Outpatient Medications  Medication Sig Dispense Refill   ACCU-CHEK AVIVA PLUS test strip      ACCU-CHEK SOFTCLIX LANCETS lancets      Acetaminophen (TYLENOL ARTHRITIS EXT RELIEF PO) Take 600 mg by mouth daily.     acetaZOLAMIDE (DIAMOX) 250 MG tablet      acetaZOLAMIDE (DIAMOX) 250 MG tablet Take by mouth.     AIMOVIG 70 MG/ML SOAJ Inject 70 mg into the skin every 28 (twenty-eight) days.     albuterol (VENTOLIN HFA) 108 (90 Base) MCG/ACT inhaler Inhale 2 puffs into the lungs every 6 (six) hours as needed for wheezing or shortness of breath. 1 g 0   ARIPiprazole (ABILIFY) 5 MG tablet Take 1 tablet (5 mg total) by mouth daily. 30 tablet 0   aspirin-acetaminophen-caffeine (EXCEDRIN MIGRAINE) 250-250-65 MG tablet Take 2 tablets by mouth every 8 (eight) hours as needed for headache or migraine.      atorvastatin (LIPITOR) 80 MG tablet Take 80 mg by mouth daily at 6 PM.     Blood Glucose Monitoring Suppl (ACCU-CHEK AVIVA PLUS) w/Device KIT       Carboxymethylcellulose Sod PF 0.5 % SOLN Apply 1 drop to eye daily as needed.     Cholecalciferol (VITAMIN D3) 1000 units CAPS Take 1 capsule by mouth daily.     clotrimazole (LOTRIMIN) 1 % cream Apply 1 application topically daily as needed. (Patient not taking: Reported on 06/02/2021)     cyclobenzaprine (FLEXERIL) 5 MG tablet Take 5 mg by mouth 3 (three) times daily as needed.      cyclobenzaprine (FLEXERIL) 5 MG tablet Take 1 tablet by mouth 3 (three) times daily as needed.     dabigatran (PRADAXA) 150 MG CAPS capsule Take 150 mg by mouth 2 (two) times daily.     diltiazem (CARDIZEM CD) 240 MG 24 hr capsule Take by mouth. Take 1 capsule by mouth once daily     diltiazem (TIAZAC) 240 MG 24 hr capsule TAKE (1) CAPSULE BY MOUTH EVERY DAY     DULoxetine (CYMBALTA) 60 MG capsule Take 1 capsule (60 mg total) by mouth 2 (two) times daily. 60 capsule 0   esomeprazole (NEXIUM) 40 MG capsule Take 40 mg by mouth daily.     famotidine (PEPCID) 40 MG tablet Take 40 mg by mouth 2 (two) times daily.     ferrous sulfate 325 (65 FE) MG tablet Take by mouth. Take 325 mg daily with breakfast     fluticasone (FLONASE) 50 MCG/ACT nasal spray Place 2 sprays into both nostrils as needed.      furosemide (LASIX) 20 MG tablet Take 20 mg by mouth daily.     gabapentin (NEURONTIN) 600 MG tablet Take 1 tablet (600 mg total) by mouth 2 (two) times daily. 180 tablet 0   hydrOXYzine (VISTARIL) 25 MG capsule TAKE 1 CAPSULE BY MOUTH TWICE DAILY AS NEEDED. FOR ANXIETY 180 capsule 0   Insulin Degludec (TRESIBA FLEXTOUCH) 200 UNIT/ML SOPN Inject 200 Units into the skin daily.     insulin lispro (HUMALOG) 100 UNIT/ML  KwikPen Inject up to 50 units daily in divided doses, as directed     Insulin Pen Needle (BD ULTRA-FINE PEN NEEDLES) 29G X 12.7MM MISC Use 3 times daily as directed     lamoTRIgine (LAMICTAL) 100 MG tablet Take 100 mg by mouth daily.     lamoTRIgine (LAMICTAL) 150 MG tablet Take 150 mg by mouth daily.      levothyroxine (SYNTHROID) 112 MCG tablet Take 112 mcg by mouth daily.     levothyroxine (SYNTHROID, LEVOTHROID) 100 MCG tablet Take 100 mcg by mouth daily.     losartan (COZAAR) 25 MG tablet Take 12.5 mg by mouth daily.     losartan (COZAAR) 25 MG tablet Take by mouth.     Magnesium 250 MG TABS Take 1 tablet by mouth daily.     magnesium oxide (MAG-OX) 400 MG tablet Take 1 tablet by mouth daily.     Melatonin 10 MG TABS Take 2 tablets by mouth at bedtime.     meloxicam (MOBIC) 15 MG tablet Take 15 mg by mouth daily.     metFORMIN (GLUCOPHAGE-XR) 500 MG 24 hr tablet Take 500 mg by mouth 2 (two) times daily.      metFORMIN (GLUCOPHAGE-XR) 500 MG 24 hr tablet Take by mouth. (Patient not taking: Reported on 06/02/2021)     metoprolol succinate (TOPROL-XL) 25 MG 24 hr tablet Take 1 tablet by mouth daily.     mometasone (ELOCON) 0.1 % cream Apply topically.     naloxone (NARCAN) nasal spray 4 mg/0.1 mL 1 spray into nose for opioid overdose. If needed, may repeat spray in 2-3 min     naloxone (NARCAN) nasal spray 4 mg/0.1 mL Place into the nose.     nitroGLYCERIN (NITROSTAT) 0.4 MG SL tablet Place under the tongue.     NUCYNTA ER 100 MG 12 hr tablet Take 1 tablet by mouth 2 (two) times daily.     nystatin (MYCOSTATIN) 100000 UNIT/ML suspension Take 5 mLs by mouth 4 (four) times daily.     omeprazole (PRILOSEC) 20 MG capsule TAKE (1) CAPSULE BY MOUTH EVERY DAY 30 capsule 0   ondansetron (ZOFRAN) 4 MG tablet Take 4 mg by mouth at bedtime.     oxyCODONE (OXY IR/ROXICODONE) 5 MG immediate release tablet Take 5 mg by mouth 2 (two) times daily as needed.     pioglitazone (ACTOS) 15 MG tablet Take by mouth. Take 1 tablet by mouth once daily     quinapril (ACCUPRIL) 10 MG tablet Take 10 mg by mouth daily.     Riboflavin 400 MG TABS Take 1 tablet by mouth every night  for headaches     Rimegepant Sulfate (NURTEC) 75 MG TBDP Take 1 tablet by mouth as needed.      Semaglutide 3 MG TABS Take by mouth. (Patient  not taking: Reported on 06/02/2021)     Semaglutide 7 MG TABS Take by mouth.     Suvorexant (BELSOMRA) 5 MG TABS Take 5 mg by mouth at bedtime. (Patient not taking: Reported on 06/02/2021) 10 tablet 1   traZODone (DESYREL) 100 MG tablet TAKE 1.5-2 TABLETS BY MOUTH AT BEDTIME AS NEEDED FOR SLEEP 180 tablet 0   TRESIBA FLEXTOUCH 200 UNIT/ML SOPN Inject 76 Units into the skin at bedtime.     No current facility-administered medications for this visit.     Musculoskeletal: Strength & Muscle Tone:  UTA Gait & Station:  Seated Patient leans: N/A  Psychiatric Specialty Exam: Review of Systems  Musculoskeletal:  Positive for back pain.  Psychiatric/Behavioral:  Positive for decreased concentration, dysphoric mood and sleep disturbance. The patient is nervous/anxious.   All other systems reviewed and are negative.  There were no vitals taken for this visit.There is no height or weight on file to calculate BMI.  General Appearance: Casual  Eye Contact:  Fair  Speech:  Clear and Coherent  Volume:  Normal  Mood:  Anxious and Depressed  Affect:  Congruent  Thought Process:  Goal Directed and Descriptions of Associations: Intact  Orientation:  Full (Time, Place, and Person)  Thought Content: Logical   Suicidal Thoughts:  No  Homicidal Thoughts:  No  Memory:  Immediate;   Fair Recent;   Fair Remote;   Fair  Judgement:  Fair  Insight:  Fair  Psychomotor Activity:  Normal  Concentration:  Concentration: Fair and Attention Span: Fair  Recall:  [employer] of Knowledge: Fair  Language: Fair  Akathisia:  No  Handed:  Right  AIMS (if indicated): done,0  Assets:  Communication Skills Desire for Improvement Housing Social Support  ADL's:  Intact  Cognition: WNL  Sleep:  Poor   Screenings: GAD-7    Flowsheet Row Office Visit from 05/26/2021 in [facility] Video Visit from 01/22/2021 in [facility] from 01/19/2021 in [place]  Total GAD-7 Score 17 2 21       PHQ2-9    [place] Visit from 05/26/2021 in [facility] Video Visit from 01/22/2021 in [facility] from 01/19/2021 in [facility] Video Visit from 12/08/2020 in [place] Video Visit from 08/04/2020 in [facility]  PHQ-2 Total Score 1 1 5 5 1   PHQ-9 Total Score 7 2 18 10  --      [place] Office Visit from 05/26/2021 in [facility] Counselor from 03/23/2021 in [place] Video Visit from 01/22/2021 in [place] No Risk Low Risk Low Risk        Assessment and Plan: EARLISHA SHARPLES is a 56 year old Caucasian female who has a history of MDD, PTSD, chronic pain, migraine headaches, history of CVA, iron deficiency, prothrombin gene mutation was evaluated by telemedicine today.  Patient is currently struggling with sleep, was unable to start [place].  Discussed plan as noted below.  Plan MDD-unstable Continue Cymbalta 120 mg p.o. daily in divided dosage Lamotrigine 125 mg p.o. daily Continue Abilify 5 mg p.o. daily-she did not tolerate being tapered off of the medication in the past.  GAD-unstable We will consider changing Cymbalta to another SSRI or SNRI. Continue Abilify for now. Hydroxyzine 25 mg p.o. daily for severe anxiety attacks Continue CBT  Insomnia-unstable Start Belsomra 5 mg p.o. nightly Continue melatonin 3 to 9 mg p.o. nightly Other options will be Lunesta, Sonata. Patient advised to let writer know if she is unable to fill this prescription.  Bereavement-improving Will monitor closely.   High risk medication use-reviewed and discussed prolactin-4.3-low, likely due to Abilify-we will consider tapering off Abilify in the future, lipid  panel--abnormal.  Patient to follow up with primary care provider for management of lipid panel.  Follow-up in clinic in 2 weeks or sooner if needed.  This note was generated in part or whole with voice recognition software. Voice recognition is usually quite accurate but there are transcription errors that can and very often do occur. I apologize for  any typographical errors that were not detected and corrected.      Ursula Alert, MD 06/03/2021, 9:43 AM

## 2021-06-04 ENCOUNTER — Encounter: Payer: Self-pay | Admitting: Oncology

## 2021-06-05 ENCOUNTER — Telehealth (INDEPENDENT_AMBULATORY_CARE_PROVIDER_SITE_OTHER): Payer: 59 | Admitting: Licensed Clinical Social Worker

## 2021-06-05 ENCOUNTER — Telehealth: Payer: Self-pay | Admitting: Psychiatry

## 2021-06-05 ENCOUNTER — Encounter: Payer: Self-pay | Admitting: Licensed Clinical Social Worker

## 2021-06-05 ENCOUNTER — Other Ambulatory Visit: Payer: Self-pay

## 2021-06-05 DIAGNOSIS — F4310 Post-traumatic stress disorder, unspecified: Secondary | ICD-10-CM

## 2021-06-05 DIAGNOSIS — G4701 Insomnia due to medical condition: Secondary | ICD-10-CM

## 2021-06-05 MED ORDER — BELSOMRA 5 MG PO TABS: 5.0000 mg | ORAL_TABLET | Freq: Every day | ORAL | 0 refills | Status: DC

## 2021-06-05 NOTE — Progress Notes (Signed)
Virtual Visit via Video Note  I connected with Christine Mcneil on 06/05/21 at  8:00 AM EST by a video enabled telemedicine application and verified that I am speaking with the correct person using two identifiers.  Location: Patient: home Provider: remote office [place], [place])   I discussed the limitations of evaluation and management by telemedicine and the availability of in person appointments. The patient expressed understanding and agreed to proceed.   I discussed the assessment and treatment plan with the patient. The patient was provided an opportunity to ask questions and all were answered. The patient agreed with the plan and demonstrated an understanding of the instructions.   The patient was advised to call back or seek an in-person evaluation if the symptoms worsen or if the condition fails to improve as anticipated.  I provided 30 minutes of non-face-to-face time during this encounter.   [name], LCSW   THERAPIST PROGRESS NOTE  Session Time: 8-830a  Participation Level: Active  Behavioral Response: Neat and Well GroomedAlertDepressed  Type of Therapy: Individual Therapy  Treatment Goals addressed:  Problem: Reduce the negative impact trauma related symptoms have on social, occupational, and family functioning.  Goal: LTG: Reduce frequency, intensity, and duration of PTSD symptoms so daily functioning is improved: Input needed on appropriate metric.  pt self report Outcome: Progressing  Goal: STG: Practice interpersonal effectiveness skills 7 times per week for the next 16 weeks Outcome: Progressing  Interventions:  Intervention: Assess emotional status and coping mechanisms  Intervention: Educate patient on: Ways to improve sleep  Intervention: Perform motivational   interviewing regarding physical activity   Summary: Christine Mcneil is a 56 y.o. female who presents with improving symptoms related to PTSD  and depression diagnoses. Pt reports that  overall mood has been stable and that she is managing her anxiety symptoms well. Pt reports decreased amount of anxiety since last session. Pt reports poor quality and quantity of sleep--psychiatrist recently prescribed medication to manage insomnia symptoms and pt is trying to get rx filled.  Allowed pt to explore and express thoughts and feelings associated with recent life situations and external stressors. Pt reports that her and sister have solved their issues, which has decreased stress in patient significantly. Pt reports that she had some stress about her mother needing some repairs around her home and nobody offered to help financially.  Pt states that her boyfriend stepped in and helped, and pt is happy about that. Pt reports relationship going well with boyfriend, sister, and mom. Pt has some stress associated with pain meds--insurance denied one fibromyalgia med and pt is having to take morphine which pt states doesn't take all of her pain away. Reviewed pain management strategies including good balance of rest and activity.   Continued recommendations are as follows: self care behaviors, positive social engagements, focusing on overall work/home/life balance, and focusing on positive physical and emotional wellness. .   Suicidal/Homicidal: No  Therapist Response: Pt is continuing to apply interventions learned in session into daily life situations. Pt is currently on track to meet goals utilizing interventions mentioned above. Personal growth and progress noted. Treatment to continue as indicated.    Plan: Return again in 4 weeks.  Diagnosis: Axis I: PTSD    Axis II: No diagnosis    Christine Bo Gumecindo Hopkin, LCSW 06/05/2021

## 2021-06-05 NOTE — Telephone Encounter (Signed)
spoke with patient dr. Lamonte Richer sent medication in

## 2021-06-05 NOTE — Telephone Encounter (Signed)
I have sent Belsomra 5 mg to [facility] in [place] per request from patient.  We will have Janett Billow CMA contact this patient and let her know.

## 2021-06-09 ENCOUNTER — Encounter: Payer: Self-pay | Admitting: Oncology

## 2021-06-16 ENCOUNTER — Encounter: Payer: Self-pay | Admitting: Oncology

## 2021-06-17 ENCOUNTER — Other Ambulatory Visit: Payer: Self-pay | Admitting: *Deleted

## 2021-06-17 DIAGNOSIS — D509 Iron deficiency anemia, unspecified: Secondary | ICD-10-CM

## 2021-06-22 ENCOUNTER — Telehealth: Payer: Self-pay | Admitting: Oncology

## 2021-06-22 NOTE — Telephone Encounter (Signed)
Spoke with patient and got her rescheduled.   Christine Mcneil

## 2021-06-22 NOTE — Telephone Encounter (Signed)
Pt called to reschedule her appt for July 15, 2022 and 2-1. Has death in family. Call back at [phone]

## 2021-06-23 ENCOUNTER — Other Ambulatory Visit: Payer: Medicare Other

## 2021-06-23 ENCOUNTER — Other Ambulatory Visit: Payer: Self-pay

## 2021-06-23 ENCOUNTER — Inpatient Hospital Stay: Payer: Medicare Other

## 2021-06-23 ENCOUNTER — Telehealth (INDEPENDENT_AMBULATORY_CARE_PROVIDER_SITE_OTHER): Payer: Medicare Other | Admitting: Psychiatry

## 2021-06-23 ENCOUNTER — Encounter: Payer: Self-pay | Admitting: Psychiatry

## 2021-06-23 DIAGNOSIS — F4310 Post-traumatic stress disorder, unspecified: Secondary | ICD-10-CM | POA: Diagnosis not present

## 2021-06-23 DIAGNOSIS — Z634 Disappearance and death of family member: Secondary | ICD-10-CM

## 2021-06-23 DIAGNOSIS — G4701 Insomnia due to medical condition: Secondary | ICD-10-CM

## 2021-06-23 DIAGNOSIS — F3342 Major depressive disorder, recurrent, in full remission: Secondary | ICD-10-CM | POA: Diagnosis not present

## 2021-06-23 DIAGNOSIS — F411 Generalized anxiety disorder: Secondary | ICD-10-CM | POA: Diagnosis not present

## 2021-06-23 DIAGNOSIS — F331 Major depressive disorder, recurrent, moderate: Secondary | ICD-10-CM

## 2021-06-23 MED ORDER — DULOXETINE HCL 60 MG PO CPEP: 60.0000 mg | ORAL_CAPSULE | Freq: Two times a day (BID) | ORAL | 0 refills | Status: DC

## 2021-06-23 MED ORDER — BELSOMRA 5 MG PO TABS: 5.0000 mg | ORAL_TABLET | Freq: Every day | ORAL | 2 refills | Status: DC

## 2021-06-23 NOTE — Progress Notes (Signed)
Virtual Visit via Video Note  I connected with Christine Mcneil on 06/23/21 at  3:00 PM EST by a video enabled telemedicine application and verified that I am speaking with the correct person using two identifiers.  Location Provider Location : [facility] Patient Location : Home  Participants: Patient , Provider   I discussed the limitations of evaluation and management by telemedicine and the availability of in person appointments. The patient expressed understanding and agreed to proceed.    I discussed the assessment and treatment plan with the patient. The patient was provided an opportunity to ask questions and all were answered. The patient agreed with the plan and demonstrated an understanding of the instructions.   The patient was advised to call back or seek an in-person evaluation if the symptoms worsen or if the condition fails to improve as anticipated.   [facility] MD OP Progress Note  06/23/2021 3:28 PM Christine Mcneil  MRN:  [id number]  Chief Complaint:  Chief Complaint   Follow-up 56 year old Caucasian female, history of MDD, PTSD, GAD, insomnia, bereavement, presented for medication management.    HPI: Christine Mcneil is a 56 year old Caucasian femalemale, divorced, lives in [place], has a history of MDD, PTSD, insomnia, GAD, OSA, history of CVA, history of prothrombin gene mutation, chronic pain, migraine headaches, iron deficiency was evaluated by telemedicine today.  Patient today reports depression has improved.  She does not feel as anxious as she used to before and reports she has been coping better.  Patient reports she is sleeping better and currently takes the [place].  She is no longer on the trazodone.  Tolerating the Belsomra well.  Her pain is more under control and currently she is on extampza prescribed by her pain management.  That also helps with her mood and sleep.  Denies suicidality, homicidality or perceptual disturbances.  Patient denies any other concerns  today.  Visit Diagnosis:    ICD-10-CM   1. MDD (major depressive disorder), recurrent, in full remission (Christine Mcneil)  F33.42     2. PTSD (post-traumatic stress disorder)  F43.10     3. GAD (generalized anxiety disorder)  F41.1     4. Insomnia due to medical condition  G47.01 Suvorexant (BELSOMRA) 5 MG TABS   pain,mood    5. Bereavement  Z63.4 DULoxetine (CYMBALTA) 60 MG capsule      Past Psychiatric History: Reviewed past psychiatric history from progress note on 08/16/2017.  Past trials of Effexor, Klonopin.  Past Medical History:  Past Medical History:  Diagnosis Date   Abdominal pain    Anxiety and depression    Atypical facial pain    Bilateral occipital neuralgia    Cervical spondylosis without myelopathy    Chronic daily headache    Chronic pain    Chronic pelvic pain in female    Chronic thoracic back pain    Depression    Diabetes mellitus without complication (HCC)    Diabetes mellitus, type II (HCC)    Dysphagia    Dysrhythmia    Fibromyalgia    Hyperlipidemia    Hypertension    Hypothyroidism    Insomnia    Iron deficiency anemia    Left hip pain    Low back pain    Migraines    Numbness    Optic neuropathy    OSA (obstructive sleep apnea)    Polyarthralgia    Primary osteoarthritis of both hips    Prothrombin gene mutation (HCC)    Pseudotumor cerebri  Rectal bleeding    Right shoulder pain    Rotator cuff syndrome    Stroke [facility])    Thyroid disease    TIA (transient ischemic attack) 05/27/2017    Past Surgical History:  Procedure Laterality Date   ABDOMINAL HYSTERECTOMY     total   CHOLECYSTECTOMY     COLONOSCOPY WITH PROPOFOL N/A 03/21/2018   Procedure: COLONOSCOPY WITH PROPOFOL;  Surgeon: Lucilla Lame, MD;  Location: [facility] ENDOSCOPY;  Service: Endoscopy;  Laterality: N/A;   ERCP N/A 08/19/2020   Procedure: ENDOSCOPIC RETROGRADE CHOLANGIOPANCREATOGRAPHY (ERCP);  Surgeon: Lucilla Lame, MD;  Location: [facility] ENDOSCOPY;  Service: Endoscopy;   Laterality: N/A;   ESOPHAGOGASTRODUODENOSCOPY (EGD) WITH PROPOFOL N/A 03/21/2018   Procedure: ESOPHAGOGASTRODUODENOSCOPY (EGD) WITH PROPOFOL;  Surgeon: Lucilla Lame, MD;  Location: [facility] ENDOSCOPY;  Service: Endoscopy;  Laterality: N/A;   GIVENS CAPSULE STUDY N/A 09/24/2019   Procedure: GIVENS CAPSULE STUDY;  Surgeon: Virgel Manifold, MD;  Location: [facility] ENDOSCOPY;  Service: Endoscopy;  Laterality: N/A;   JOINT REPLACEMENT     KNEE SURGERY Left    ROTATOR CUFF REPAIR Right    spinal neurostimulator      Family Psychiatric History: Reviewed family psychiatric history from progress note on 08/16/2017.  Family History:  Family History  Problem Relation Age of Onset   Diabetes Mother    Hyperlipidemia Mother    Hypertension Mother    COPD Mother    CVA Mother    Anemia Mother    Diabetes Father    Hyperlipidemia Father    Hypertension Father    Thyroid disease Father    Alcohol abuse Father    Peripheral vascular disease Sister    Hypertension Sister    Anxiety disorder Son    Depression Son    Bipolar disorder Son    Seizures Son     Social History: Reviewed social history from my progress note on 08/16/2017. Social History   Socioeconomic History   Marital status: Divorced    Spouse name: Not on file   Number of children: 1   Years of education: Not on file   Highest education level: High school graduate  Occupational History    Comment: disablitiy  Tobacco Use   Smoking status: Never   Smokeless tobacco: Never  Vaping Use   Vaping Use: Never used  Substance and Sexual Activity   Alcohol use: No   Drug use: No   Sexual activity: Not Currently  Other Topics Concern   Not on file  Social History Narrative   Not on file   Social Determinants of Health   Financial Resource Strain: Not on file  Food Insecurity: Not on file  Transportation Needs: Not on file  Physical Activity: Not on file  Stress: Not on file  Social Connections: Not on file     Allergies:  Allergies  Allergen Reactions   Amoxapine Itching   Amoxicillin Itching   Dapagliflozin Itching    Other reaction(s): Other (See Comments) Thrush & vaginal itching   Keflex [Cephalexin] Itching   Mirtazapine Other (See Comments)    Pt states felling "like my body is outside of me."    Metabolic Disorder Labs: Lab Results  Component Value Date   HGBA1C 9.9 (H) 06/21/2017   MPG 237.43 06/21/2017   Lab Results  Component Value Date   PROLACTIN 4.3 (L) 05/26/2021   PROLACTIN 5.5 09/06/2017   Lab Results  Component Value Date   CHOL 157 05/26/2021   TRIG 245 (H) 05/26/2021  HDL 32 (L) 05/26/2021   CHOLHDL 4.9 (H) 05/26/2021   VLDL 39 06/21/2017   LDLCALC 84 05/26/2021   LDLCALC 174 (H) 06/21/2017   Lab Results  Component Value Date   TSH 2.890 09/06/2017   TSH 3.176 01/04/2017    Therapeutic Level Labs: No results found for: LITHIUM No results found for: VALPROATE No components found for:  CBMZ  Current Medications: Current Outpatient Medications  Medication Sig Dispense Refill   oxyCODONE ER 9 MG C12A Take by mouth.     ACCU-CHEK AVIVA PLUS test strip      ACCU-CHEK SOFTCLIX LANCETS lancets      Acetaminophen (TYLENOL ARTHRITIS EXT RELIEF PO) Take 600 mg by mouth daily.     acetaZOLAMIDE (DIAMOX) 250 MG tablet      acetaZOLAMIDE (DIAMOX) 250 MG tablet Take by mouth.     AIMOVIG 70 MG/ML SOAJ Inject 70 mg into the skin every 28 (twenty-eight) days.     albuterol (VENTOLIN HFA) 108 (90 Base) MCG/ACT inhaler Inhale 2 puffs into the lungs every 6 (six) hours as needed for wheezing or shortness of breath. 1 g 0   ARIPiprazole (ABILIFY) 5 MG tablet Take 1 tablet (5 mg total) by mouth daily. 30 tablet 0   aspirin-acetaminophen-caffeine (EXCEDRIN MIGRAINE) 250-250-65 MG tablet Take 2 tablets by mouth every 8 (eight) hours as needed for headache or migraine.      atorvastatin (LIPITOR) 80 MG tablet Take 80 mg by mouth daily at 6 PM.     Blood Glucose  Monitoring Suppl (ACCU-CHEK AVIVA PLUS) w/Device KIT      Carboxymethylcellulose Sod PF 0.5 % SOLN Apply 1 drop to eye daily as needed.     Cholecalciferol (VITAMIN D3) 1000 units CAPS Take 1 capsule by mouth daily.     clobetasol ointment (TEMOVATE) 0.05 % Apply topically.     clotrimazole (LOTRIMIN) 1 % cream Apply 1 application topically daily as needed. (Patient not taking: Reported on 06/02/2021)     cyclobenzaprine (FLEXERIL) 5 MG tablet Take 5 mg by mouth 3 (three) times daily as needed.      cyclobenzaprine (FLEXERIL) 5 MG tablet Take 1 tablet by mouth 3 (three) times daily as needed.     dabigatran (PRADAXA) 150 MG CAPS capsule Take 150 mg by mouth 2 (two) times daily.     diltiazem (CARDIZEM CD) 240 MG 24 hr capsule Take by mouth. Take 1 capsule by mouth once daily     diltiazem (TIAZAC) 240 MG 24 hr capsule TAKE (1) CAPSULE BY MOUTH EVERY DAY     DULoxetine (CYMBALTA) 60 MG capsule Take 1 capsule (60 mg total) by mouth 2 (two) times daily. 180 capsule 0   esomeprazole (NEXIUM) 40 MG capsule Take 40 mg by mouth daily.     ezetimibe (ZETIA) 10 MG tablet ezetimibe 10 mg tablet     famotidine (PEPCID) 40 MG tablet Take 40 mg by mouth 2 (two) times daily.     ferrous sulfate 325 (65 FE) MG tablet Take by mouth. Take 325 mg daily with breakfast     fluticasone (FLONASE) 50 MCG/ACT nasal spray Place 2 sprays into both nostrils as needed.      furosemide (LASIX) 20 MG tablet Take 20 mg by mouth daily.     gabapentin (NEURONTIN) 100 MG capsule gabapentin 100 mg capsule     gabapentin (NEURONTIN) 600 MG tablet Take 1 tablet (600 mg total) by mouth 2 (two) times daily. 180 tablet 0   hydrOXYzine (  VISTARIL) 25 MG capsule TAKE 1 CAPSULE BY MOUTH TWICE DAILY AS NEEDED. FOR ANXIETY 180 capsule 0   Insulin Degludec (TRESIBA FLEXTOUCH) 200 UNIT/ML SOPN Inject 200 Units into the skin daily.     insulin lispro (HUMALOG) 100 UNIT/ML KwikPen Inject up to 50 units daily in divided doses, as directed      Insulin Pen Needle (BD ULTRA-FINE PEN NEEDLES) 29G X 12.7MM MISC Use 3 times daily as directed     lamoTRIgine (LAMICTAL) 100 MG tablet Take 100 mg by mouth daily.     lamoTRIgine (LAMICTAL) 150 MG tablet Take 150 mg by mouth daily.     levothyroxine (SYNTHROID) 112 MCG tablet Take 112 mcg by mouth daily.     levothyroxine (SYNTHROID, LEVOTHROID) 100 MCG tablet Take 100 mcg by mouth daily.     losartan (COZAAR) 25 MG tablet Take 12.5 mg by mouth daily.     losartan (COZAAR) 25 MG tablet Take by mouth.     Magnesium 250 MG TABS Take 1 tablet by mouth daily.     magnesium oxide (MAG-OX) 400 MG tablet Take 1 tablet by mouth daily.     Melatonin 10 MG TABS Take 2 tablets by mouth at bedtime.     meloxicam (MOBIC) 15 MG tablet Take 15 mg by mouth daily.     metFORMIN (GLUCOPHAGE-XR) 500 MG 24 hr tablet Take 500 mg by mouth 2 (two) times daily.      metFORMIN (GLUCOPHAGE-XR) 500 MG 24 hr tablet Take by mouth. (Patient not taking: Reported on 06/02/2021)     metoprolol succinate (TOPROL-XL) 25 MG 24 hr tablet Take 1 tablet by mouth daily.     mometasone (ELOCON) 0.1 % cream Apply topically.     mupirocin ointment (BACTROBAN) 2 % mupirocin 2 % topical ointment     naloxone (NARCAN) nasal spray 4 mg/0.1 mL 1 spray into nose for opioid overdose. If needed, may repeat spray in 2-3 min     naloxone (NARCAN) nasal spray 4 mg/0.1 mL Place into the nose.     nitroGLYCERIN (NITROSTAT) 0.4 MG SL tablet Place under the tongue.     NUCYNTA ER 100 MG 12 hr tablet Take 1 tablet by mouth 2 (two) times daily.     nystatin (MYCOSTATIN) 100000 UNIT/ML suspension Take 5 mLs by mouth 4 (four) times daily.     omeprazole (PRILOSEC) 20 MG capsule TAKE (1) CAPSULE BY MOUTH EVERY DAY 30 capsule 0   ondansetron (ZOFRAN) 4 MG tablet Take 4 mg by mouth at bedtime.     oxyCODONE (OXY IR/ROXICODONE) 5 MG immediate release tablet Take 5 mg by mouth 2 (two) times daily as needed. (Patient not taking: Reported on 06/23/2021)      oxyCODONE (OXYCONTIN) 10 mg 12 hr tablet Take by mouth. (Patient not taking: Reported on 06/23/2021)     pioglitazone (ACTOS) 15 MG tablet Take by mouth. Take 1 tablet by mouth once daily     quinapril (ACCUPRIL) 10 MG tablet Take 10 mg by mouth daily.     Riboflavin 400 MG TABS Take 1 tablet by mouth every night  for headaches     Rimegepant Sulfate (NURTEC) 75 MG TBDP Take 1 tablet by mouth as needed.      Semaglutide 3 MG TABS Take by mouth. (Patient not taking: Reported on 06/02/2021)     Semaglutide 7 MG TABS Take by mouth.     [START ON 07/06/2021] Suvorexant (BELSOMRA) 5 MG TABS Take 5 mg by mouth  at bedtime. 30 tablet 2   TRESIBA FLEXTOUCH 200 UNIT/ML SOPN Inject 76 Units into the skin at bedtime.     XTAMPZA ER 9 MG C12A PLEASE SEE ATTACHED FOR DETAILED DIRECTIONS     No current facility-administered medications for this visit.     Musculoskeletal: Strength & Muscle Tone:  UTA Gait & Station:  Seated Patient leans: N/A  Psychiatric Specialty Exam: Review of Systems  Psychiatric/Behavioral:  The patient is nervous/anxious.   All other systems reviewed and are negative.  There were no vitals taken for this visit.There is no height or weight on file to calculate BMI.  General Appearance: Casual  Eye Contact:  Fair  Speech:  Clear and Coherent  Volume:  Normal  Mood:  Anxious improving  Affect:  Appropriate  Thought Process:  Goal Directed and Descriptions of Associations: Intact  Orientation:  Full (Time, Place, and Person)  Thought Content: Logical   Suicidal Thoughts:  No  Homicidal Thoughts:  No  Memory:  Immediate;   Fair Recent;   Fair Remote;   Fair  Judgement:  Fair  Insight:  Fair  Psychomotor Activity:  Normal  Concentration:  Concentration: Fair and Attention Span: Fair  Recall:  [employer] of Knowledge: Fair  Language: Fair  Akathisia:  No  Handed:  Right  AIMS (if indicated): done,0  Assets:  Communication Skills Desire for Improvement Housing Social  Support  ADL's:  Intact  Cognition: WNL  Sleep:  Fair   Screenings: GAD-7    Flowsheet Row Video Visit from 06/23/2021 in [facility] Office Visit from 05/26/2021 in [facility] Video Visit from 01/22/2021 in [facility] from 01/19/2021 in [place]  Total GAD-7 Score 0 17 2 21       PHQ2-9    Flowsheet Row Video Visit from 06/23/2021 in [place] Office Visit from 05/26/2021 in [facility] Video Visit from 01/22/2021 in [facility] from 01/19/2021 in [place] Video Visit from 12/08/2020 in [place]  PHQ-2 Total Score 0 1 1 5 5   PHQ-9 Total Score -- 7 2 18 10       Flowsheet Row Video Visit from 06/05/2021 in [place] Office Visit from 05/26/2021 in [place] Counselor from 03/23/2021 in [facility] No Risk No Risk Low Risk        Assessment and Plan: Christine Mcneil is a 56 year old Caucasian female who has a history of MDD, PTSD, chronic pain, migraine headaches, history of CVA, iron deficiency, prothrombin gene mutation was evaluated by telemedicine today.  Patient is currently improving with regards to her mood and sleep, will benefit from the following plan.  Plan MDD in remission Cymbalta 120 mg p.o. daily in divided dosage Lamotrigine 125 mg p.o. daily Abilify 5 mg p.o. daily  GAD-stable Cymbalta as prescribed Continue CBT  Insomnia-improving Belsomra 5 mg p.o. nightly   Bereavement-improving We will monitor closely Continue CBT  Follow-up in clinic in 2 to 3 months or sooner if needed.  This note was generated in part or whole with voice recognition software. Voice recognition is  usually quite accurate but there are transcription errors that can and very often do occur. I apologize for any typographical errors that were not detected and corrected.      Ursula Alert, MD 06/24/2021, 12:32 PM

## 2021-06-24 ENCOUNTER — Ambulatory Visit: Payer: Medicare Other | Admitting: Oncology

## 2021-06-24 ENCOUNTER — Other Ambulatory Visit: Payer: Medicare Other

## 2021-06-24 ENCOUNTER — Encounter: Payer: Self-pay | Admitting: Oncology

## 2021-07-10 ENCOUNTER — Other Ambulatory Visit: Payer: Self-pay | Admitting: Psychiatry

## 2021-07-10 DIAGNOSIS — F411 Generalized anxiety disorder: Secondary | ICD-10-CM

## 2021-07-13 ENCOUNTER — Telehealth: Payer: Self-pay | Admitting: Licensed Clinical Social Worker

## 2021-07-13 ENCOUNTER — Other Ambulatory Visit: Payer: Self-pay

## 2021-07-13 ENCOUNTER — Ambulatory Visit (INDEPENDENT_AMBULATORY_CARE_PROVIDER_SITE_OTHER): Payer: 59 | Admitting: Licensed Clinical Social Worker

## 2021-07-13 DIAGNOSIS — Z91199 Patient's noncompliance with other medical treatment and regimen due to unspecified reason: Secondary | ICD-10-CM

## 2021-07-13 NOTE — Progress Notes (Signed)
LCSW counselor tried to connect with patient for scheduled appointment via MyChart video text request x 2 and email request; also tried to connect via phone without success. LCSW counselor left message for patient to call office number to reschedule OPT appointment.   Attempt 1: Text and email: 9:04am  Attempt 2: Text and email: 9:08am  Attempt 3: phone call and left message:  9:15am

## 2021-07-13 NOTE — Telephone Encounter (Signed)
LCSW counselor tried to connect with patient for scheduled appointment via MyChart video text request x 2 and email request; also tried to connect via phone without success. LCSW counselor left message for patient to call office number to reschedule OPT appointment.   Attempt 1: Text and email: 9:04am  Attempt 2: Text and email: 9:08am  Attempt 3: phone call and left message:  9:15am

## 2021-07-15 ENCOUNTER — Encounter: Payer: Self-pay | Admitting: Oncology

## 2021-07-16 ENCOUNTER — Inpatient Hospital Stay: Payer: Medicare Other | Attending: Oncology

## 2021-07-16 ENCOUNTER — Other Ambulatory Visit: Payer: Self-pay

## 2021-07-16 DIAGNOSIS — D6852 Prothrombin gene mutation: Secondary | ICD-10-CM | POA: Diagnosis not present

## 2021-07-16 DIAGNOSIS — D509 Iron deficiency anemia, unspecified: Secondary | ICD-10-CM | POA: Insufficient documentation

## 2021-07-16 DIAGNOSIS — Z79899 Other long term (current) drug therapy: Secondary | ICD-10-CM | POA: Diagnosis not present

## 2021-07-16 DIAGNOSIS — Z8673 Personal history of transient ischemic attack (TIA), and cerebral infarction without residual deficits: Secondary | ICD-10-CM | POA: Diagnosis not present

## 2021-07-16 DIAGNOSIS — E039 Hypothyroidism, unspecified: Secondary | ICD-10-CM | POA: Insufficient documentation

## 2021-07-16 DIAGNOSIS — E538 Deficiency of other specified B group vitamins: Secondary | ICD-10-CM | POA: Insufficient documentation

## 2021-07-16 LAB — CBC WITH DIFFERENTIAL/PLATELET
Abs Immature Granulocytes: 0.05 10*3/uL (ref 0.00–0.07)
Basophils Absolute: 0 10*3/uL (ref 0.0–0.1)
Basophils Relative: 1 %
Eosinophils Absolute: 0.2 10*3/uL (ref 0.0–0.5)
Eosinophils Relative: 3 %
HCT: 44.1 % (ref 36.0–46.0)
Hemoglobin: 14.6 g/dL (ref 12.0–15.0)
Immature Granulocytes: 1 %
Lymphocytes Relative: 36 %
Lymphs Abs: 2.1 10*3/uL (ref 0.7–4.0)
MCH: 29.9 pg (ref 26.0–34.0)
MCHC: 33.1 g/dL (ref 30.0–36.0)
MCV: 90.2 fL (ref 80.0–100.0)
Monocytes Absolute: 0.4 10*3/uL (ref 0.1–1.0)
Monocytes Relative: 7 %
Neutro Abs: 3.1 10*3/uL (ref 1.7–7.7)
Neutrophils Relative %: 52 %
Platelets: 213 10*3/uL (ref 150–400)
RBC: 4.89 MIL/uL (ref 3.87–5.11)
RDW: 12.8 % (ref 11.5–15.5)
WBC: 5.8 10*3/uL (ref 4.0–10.5)
nRBC: 0 % (ref 0.0–0.2)

## 2021-07-16 LAB — FERRITIN: Ferritin: 108 ng/mL (ref 11–307)

## 2021-07-16 LAB — IRON AND TIBC
Iron: 92 ug/dL (ref 28–170)
Saturation Ratios: 21 % (ref 10.4–31.8)
TIBC: 433 ug/dL (ref 250–450)
UIBC: 341 ug/dL

## 2021-07-17 ENCOUNTER — Other Ambulatory Visit: Payer: Self-pay

## 2021-07-17 ENCOUNTER — Inpatient Hospital Stay (HOSPITAL_BASED_OUTPATIENT_CLINIC_OR_DEPARTMENT_OTHER): Payer: Medicare Other | Admitting: Nurse Practitioner

## 2021-07-17 ENCOUNTER — Encounter: Payer: Self-pay | Admitting: Nurse Practitioner

## 2021-07-17 VITALS — BP 113/79 | HR 68 | Temp 98.6°F | Wt 239.1 lb

## 2021-07-17 DIAGNOSIS — E538 Deficiency of other specified B group vitamins: Secondary | ICD-10-CM | POA: Diagnosis not present

## 2021-07-17 DIAGNOSIS — D509 Iron deficiency anemia, unspecified: Secondary | ICD-10-CM

## 2021-07-17 MED ORDER — VITAMIN B-12 100 MCG PO TABS: 100.0000 ug | ORAL_TABLET | Freq: Every day | ORAL | 1 refills | Status: AC

## 2021-07-17 NOTE — Progress Notes (Signed)
Hematology/Oncology Consult Note [facility]  Telephone:([phone] [phone]  Patient Care Team: Doughton, Latricia Heft, MD as PCP - General (Student) Lucilla Lame, MD as Consulting Physician (Gastroenterology)   Name of the patient: Christine Mcneil  [id number]  Dec 22, 1965   Date of visit: 07/17/21  Diagnosis-history of iron deficiency anemia  Chief complaint/ Reason for visit-routine follow-up of iron deficiency anemia   Heme/Onc history: Patient is a 56 year old female is a transfer of care from Dr. Mike Gip for history of iron deficiency anemia which has been followed since 2019.EGD on 03/21/2018 was normal.  Colonoscopy on 03/21/2018 revealed for 1 to 2 mm polyps in the sigmoid colon and internal hemorrhoids.  Pathology revealed hyperplastic polyps.  Patient also had capsule study in May 2021 which was inconclusive as it did not enter the small bowel.   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  She has a prothrombin gene mutation.  She is on long term anticoagulation with Pradaxa.   Interval history-patient is 56 year old female who returns to clinic for follow-up of iron deficiency anemia.  Previously, she was found to have low B12 level and took B12 injections twice.  She continues oral iron.  Tolerating well.  No black or bloody stools.  Denies blood in her urine.  She feels well today and denies significant fatigue, pica, restless leg.  ECOG PS- 1 Pain scale- 0  Review of systems- Review of Systems  Constitutional:  Negative for chills, fever, malaise/fatigue and weight loss.  HENT:  Negative for congestion, ear discharge and nosebleeds.   Eyes:  Negative for blurred vision.  Respiratory:  Negative for cough, hemoptysis, sputum production, shortness of breath and wheezing.   Cardiovascular:  Negative for chest pain, palpitations, orthopnea and claudication.  Gastrointestinal:  Negative for abdominal pain, blood in stool,  constipation, diarrhea, heartburn, melena, nausea and vomiting.  Genitourinary:  Negative for dysuria, flank pain, frequency, hematuria and urgency.  Musculoskeletal:  Negative for back pain, joint pain and myalgias.  Skin:  Negative for rash.  Neurological:  Negative for dizziness, tingling, focal weakness, seizures, weakness and headaches.  Endo/Heme/Allergies:  Does not bruise/bleed easily.  Psychiatric/Behavioral:  Negative for depression and suicidal ideas. The patient does not have insomnia.      Allergies  Allergen Reactions   Amoxapine Itching   Amoxicillin Itching   Dapagliflozin Itching    Other reaction(s): Other (See Comments) Thrush & vaginal itching   Keflex [Cephalexin] Itching   Mirtazapine Other (See Comments)    Pt states felling "like my body is outside of me."     Past Medical History:  Diagnosis Date   Abdominal pain    Anxiety and depression    Atypical facial pain    Bilateral occipital neuralgia    Cervical spondylosis without myelopathy    Chronic daily headache    Chronic pain    Chronic pelvic pain in female    Chronic thoracic back pain    Depression    Diabetes mellitus without complication (HCC)    Diabetes mellitus, type II (HCC)    Dysphagia    Dysrhythmia    Fibromyalgia    Hyperlipidemia    Hypertension    Hypothyroidism    Insomnia    Iron deficiency anemia    Left hip pain    Low back pain    Migraines    Numbness    Optic neuropathy    OSA (obstructive sleep apnea)    Polyarthralgia  Primary osteoarthritis of both hips    Prothrombin gene mutation (HCC)    Pseudotumor cerebri    Rectal bleeding    Right shoulder pain    Rotator cuff syndrome    Stroke [facility])    Thyroid disease    TIA (transient ischemic attack) 05/27/2017     Past Surgical History:  Procedure Laterality Date   ABDOMINAL HYSTERECTOMY     total   CHOLECYSTECTOMY     COLONOSCOPY WITH PROPOFOL N/A 03/21/2018   Procedure: COLONOSCOPY WITH PROPOFOL;   Surgeon: Lucilla Lame, MD;  Location: [facility] ENDOSCOPY;  Service: Endoscopy;  Laterality: N/A;   ERCP N/A 08/19/2020   Procedure: ENDOSCOPIC RETROGRADE CHOLANGIOPANCREATOGRAPHY (ERCP);  Surgeon: Lucilla Lame, MD;  Location: [facility] ENDOSCOPY;  Service: Endoscopy;  Laterality: N/A;   ESOPHAGOGASTRODUODENOSCOPY (EGD) WITH PROPOFOL N/A 03/21/2018   Procedure: ESOPHAGOGASTRODUODENOSCOPY (EGD) WITH PROPOFOL;  Surgeon: Lucilla Lame, MD;  Location: [facility] ENDOSCOPY;  Service: Endoscopy;  Laterality: N/A;   GIVENS CAPSULE STUDY N/A 09/24/2019   Procedure: GIVENS CAPSULE STUDY;  Surgeon: Virgel Manifold, MD;  Location: [facility] ENDOSCOPY;  Service: Endoscopy;  Laterality: N/A;   JOINT REPLACEMENT     KNEE SURGERY Left    ROTATOR CUFF REPAIR Right    spinal neurostimulator      Social History   Socioeconomic History   Marital status: Divorced    Spouse name: Not on file   Number of children: 1   Years of education: Not on file   Highest education level: High school graduate  Occupational History    Comment: disablitiy  Tobacco Use   Smoking status: Never   Smokeless tobacco: Never  Vaping Use   Vaping Use: Never used  Substance and Sexual Activity   Alcohol use: No   Drug use: No   Sexual activity: Not Currently  Other Topics Concern   Not on file  Social History Narrative   Not on file   Social Determinants of Health   Financial Resource Strain: Not on file  Food Insecurity: Not on file  Transportation Needs: Not on file  Physical Activity: Not on file  Stress: Not on file  Social Connections: Not on file  Intimate Partner Violence: Not on file    Family History  Problem Relation Age of Onset   Diabetes Mother    Hyperlipidemia Mother    Hypertension Mother    COPD Mother    CVA Mother    Anemia Mother    Diabetes Father    Hyperlipidemia Father    Hypertension Father    Thyroid disease Father    Alcohol abuse Father    Peripheral vascular disease Sister    Hypertension  Sister    Anxiety disorder Son    Depression Son    Bipolar disorder Son    Seizures Son      Current Outpatient Medications:    ACCU-CHEK AVIVA PLUS test strip, , Disp: , Rfl:    ACCU-CHEK SOFTCLIX LANCETS lancets, , Disp: , Rfl:    Acetaminophen (TYLENOL ARTHRITIS EXT RELIEF PO), Take 600 mg by mouth daily., Disp: , Rfl:    acetaZOLAMIDE (DIAMOX) 250 MG tablet, , Disp: , Rfl:    acetaZOLAMIDE (DIAMOX) 250 MG tablet, Take by mouth., Disp: , Rfl:    AIMOVIG 70 MG/ML SOAJ, Inject 70 mg into the skin every 28 (twenty-eight) days., Disp: , Rfl:    albuterol (VENTOLIN HFA) 108 (90 Base) MCG/ACT inhaler, Inhale 2 puffs into the lungs every 6 (six) hours as needed  for wheezing or shortness of breath., Disp: 1 g, Rfl: 0   ARIPiprazole (ABILIFY) 5 MG tablet, Take 1 tablet (5 mg total) by mouth daily., Disp: 30 tablet, Rfl: 0   aspirin-acetaminophen-caffeine (EXCEDRIN MIGRAINE) 250-250-65 MG tablet, Take 2 tablets by mouth every 8 (eight) hours as needed for headache or migraine. , Disp: , Rfl:    atorvastatin (LIPITOR) 80 MG tablet, Take 80 mg by mouth daily at 6 PM., Disp: , Rfl:    Blood Glucose Monitoring Suppl (ACCU-CHEK AVIVA PLUS) w/Device KIT, , Disp: , Rfl:    Carboxymethylcellulose Sod PF 0.5 % SOLN, Apply 1 drop to eye daily as needed., Disp: , Rfl:    Cholecalciferol (VITAMIN D3) 1000 units CAPS, Take 1 capsule by mouth daily., Disp: , Rfl:    clobetasol ointment (TEMOVATE) 0.05 %, Apply topically., Disp: , Rfl:    clotrimazole (LOTRIMIN) 1 % cream, Apply 1 application topically daily as needed., Disp: , Rfl:    cyclobenzaprine (FLEXERIL) 5 MG tablet, Take 5 mg by mouth 3 (three) times daily as needed. , Disp: , Rfl:    cyclobenzaprine (FLEXERIL) 5 MG tablet, Take 1 tablet by mouth 3 (three) times daily as needed., Disp: , Rfl:    diltiazem (TIAZAC) 240 MG 24 hr capsule, TAKE (1) CAPSULE BY MOUTH EVERY DAY, Disp: , Rfl:    DULoxetine (CYMBALTA) 60 MG capsule, Take 1 capsule (60 mg  total) by mouth 2 (two) times daily., Disp: 180 capsule, Rfl: 0   esomeprazole (NEXIUM) 40 MG capsule, Take 40 mg by mouth daily., Disp: , Rfl:    ezetimibe (ZETIA) 10 MG tablet, ezetimibe 10 mg tablet, Disp: , Rfl:    famotidine (PEPCID) 40 MG tablet, Take 40 mg by mouth 2 (two) times daily., Disp: , Rfl:    ferrous sulfate 325 (65 FE) MG tablet, Take by mouth. Take 325 mg daily with breakfast, Disp: , Rfl:    fluticasone (FLONASE) 50 MCG/ACT nasal spray, Place 2 sprays into both nostrils as needed. , Disp: , Rfl:    gabapentin (NEURONTIN) 100 MG capsule, gabapentin 100 mg capsule, Disp: , Rfl:    gabapentin (NEURONTIN) 600 MG tablet, Take 1 tablet (600 mg total) by mouth 2 (two) times daily., Disp: 180 tablet, Rfl: 0   hydrOXYzine (VISTARIL) 25 MG capsule, TAKE 1 CAPSULE BY MOUTH TWICE DAILY AS NEEDED. FOR ANXIETY, Disp: 180 capsule, Rfl: 0   Insulin Degludec (TRESIBA FLEXTOUCH) 200 UNIT/ML SOPN, Inject 200 Units into the skin daily., Disp: , Rfl:    insulin lispro (HUMALOG) 100 UNIT/ML KwikPen, Inject up to 50 units daily in divided doses, as directed, Disp: , Rfl:    Insulin Pen Needle (BD ULTRA-FINE PEN NEEDLES) 29G X 12.7MM MISC, Use 3 times daily as directed, Disp: , Rfl:    lamoTRIgine (LAMICTAL) 100 MG tablet, Take 100 mg by mouth daily., Disp: , Rfl:    lamoTRIgine (LAMICTAL) 150 MG tablet, Take 150 mg by mouth daily., Disp: , Rfl:    levothyroxine (SYNTHROID) 112 MCG tablet, Take 112 mcg by mouth daily., Disp: , Rfl:    levothyroxine (SYNTHROID, LEVOTHROID) 100 MCG tablet, Take 100 mcg by mouth daily., Disp: , Rfl:    losartan (COZAAR) 25 MG tablet, Take 12.5 mg by mouth daily., Disp: , Rfl:    losartan (COZAAR) 25 MG tablet, Take by mouth., Disp: , Rfl:    Magnesium 250 MG TABS, Take 1 tablet by mouth daily., Disp: , Rfl:    magnesium oxide (MAG-OX) 400  MG tablet, Take 1 tablet by mouth daily., Disp: , Rfl:    Melatonin 10 MG TABS, Take 2 tablets by mouth at bedtime., Disp: , Rfl:     meloxicam (MOBIC) 15 MG tablet, Take 15 mg by mouth daily., Disp: , Rfl:    metFORMIN (GLUCOPHAGE-XR) 500 MG 24 hr tablet, Take 500 mg by mouth 2 (two) times daily. , Disp: , Rfl:    metFORMIN (GLUCOPHAGE-XR) 500 MG 24 hr tablet, Take by mouth., Disp: , Rfl:    metoprolol succinate (TOPROL-XL) 25 MG 24 hr tablet, Take 1 tablet by mouth daily., Disp: , Rfl:    mometasone (ELOCON) 0.1 % cream, Apply topically., Disp: , Rfl:    mupirocin ointment (BACTROBAN) 2 %, mupirocin 2 % topical ointment, Disp: , Rfl:    naloxone (NARCAN) nasal spray 4 mg/0.1 mL, 1 spray into nose for opioid overdose. If needed, may repeat spray in 2-3 min, Disp: , Rfl:    naloxone (NARCAN) nasal spray 4 mg/0.1 mL, Place into the nose., Disp: , Rfl:    nitroGLYCERIN (NITROSTAT) 0.4 MG SL tablet, Place under the tongue., Disp: , Rfl:    NUCYNTA ER 100 MG 12 hr tablet, Take 1 tablet by mouth 2 (two) times daily., Disp: , Rfl:    nystatin (MYCOSTATIN) 100000 UNIT/ML suspension, Take 5 mLs by mouth 4 (four) times daily., Disp: , Rfl:    omeprazole (PRILOSEC) 20 MG capsule, TAKE (1) CAPSULE BY MOUTH EVERY DAY, Disp: 30 capsule, Rfl: 0   ondansetron (ZOFRAN) 4 MG tablet, Take 4 mg by mouth at bedtime., Disp: , Rfl:    oxyCODONE (OXY IR/ROXICODONE) 5 MG immediate release tablet, Take 5 mg by mouth 2 (two) times daily as needed., Disp: , Rfl:    quinapril (ACCUPRIL) 10 MG tablet, Take 10 mg by mouth daily., Disp: , Rfl:    Riboflavin 400 MG TABS, Take 1 tablet by mouth every night  for headaches, Disp: , Rfl:    Rimegepant Sulfate (NURTEC) 75 MG TBDP, Take 1 tablet by mouth as needed. , Disp: , Rfl:    Semaglutide 3 MG TABS, Take by mouth., Disp: , Rfl:    Semaglutide 7 MG TABS, Take by mouth., Disp: , Rfl:    Suvorexant (BELSOMRA) 5 MG TABS, Take 5 mg by mouth at bedtime., Disp: 30 tablet, Rfl: 2   TRESIBA FLEXTOUCH 200 UNIT/ML SOPN, Inject 76 Units into the skin at bedtime., Disp: , Rfl:    XTAMPZA ER 9 MG C12A, PLEASE SEE ATTACHED  FOR DETAILED DIRECTIONS, Disp: , Rfl:    dabigatran (PRADAXA) 150 MG CAPS capsule, Take 150 mg by mouth 2 (two) times daily., Disp: , Rfl:    diltiazem (CARDIZEM CD) 240 MG 24 hr capsule, Take by mouth. Take 1 capsule by mouth once daily, Disp: , Rfl:    furosemide (LASIX) 20 MG tablet, Take 20 mg by mouth daily., Disp: , Rfl:    pioglitazone (ACTOS) 15 MG tablet, Take by mouth. Take 1 tablet by mouth once daily, Disp: , Rfl:   Physical exam:  Vitals:   07/17/21 0936  BP: 113/79  Pulse: 68  Temp: 98.6 F (37 C)  TempSrc: Tympanic  Weight: 239 lb 1.6 oz (108.5 kg)   Physical Exam Constitutional:      Appearance: She is not ill-appearing.  Cardiovascular:     Rate and Rhythm: Normal rate and regular rhythm.  Pulmonary:     Effort: Pulmonary effort is normal.  Abdominal:  Tenderness: There is no abdominal tenderness. There is no guarding.  Skin:    General: Skin is warm and dry.     Coloration: Skin is not pale.  Neurological:     Mental Status: She is alert and oriented to person, place, and time.  Psychiatric:        Mood and Affect: Mood normal.        Behavior: Behavior normal.     CMP Latest Ref Rng & Units 10/21/2020  Glucose 65 - 99 mg/dL 233(H)  BUN 6 - 24 mg/dL 7  Creatinine 0.57 - 1.00 mg/dL 0.99  Sodium 134 - 144 mmol/L 137  Potassium 3.5 - 5.2 mmol/L 4.1  Chloride 96 - 106 mmol/L 97  CO2 20 - 29 mmol/L 21  Calcium 8.7 - 10.2 mg/dL 8.9  Total Protein 6.0 - 8.5 g/dL 7.3  Total Bilirubin 0.0 - 1.2 mg/dL 0.3  Alkaline Phos 44 - 121 IU/L 119  AST 0 - 40 IU/L 31  ALT 0 - 32 IU/L 34(H)   CBC Latest Ref Rng & Units 07/16/2021  WBC 4.0 - 10.5 K/uL 5.8  Hemoglobin 12.0 - 15.0 g/dL 14.6  Hematocrit 36.0 - 46.0 % 44.1  Platelets 150 - 400 K/uL 213   Iron/TIBC/Ferritin/ %Sat    Component Value Date/Time   IRON 92 07/16/2021 1404   TIBC 433 07/16/2021 1404   FERRITIN 108 07/16/2021 1404   FERRITIN 75 09/25/2020 1550   IRONPCTSAT 21 07/16/2021 1404    Component Ref Range & Units 4 mo ago 3 yr ago  Vitamin B-12 180 - 914 pg/mL 209  1,927 High     Assessment and plan- Patient is a 56 y.o. female with   Iron deficiency anemia: Hemoglobin 14.6. Normocytic. Ferritin 108, iron sat 21%. Tolerating oral iron well without significant side effects. No indication for IV iron at this time. She last required IV iron in February 2022. Continue to monitor.  B12 Deficiency- b12 subtherapeutic 4 months ago, ~ 200. Goal >/= 500. S/p parenteral B12 injection x 2. Last November 2022. Trial oral b12, 1000 mcg daily. Plan to recheck b12 at next visit. If low, consider monthly injections.   Disposition 3 mo- lab only (cbc, ferritin, iron studies, b12) 6 mo- lab (cbc, ferritin, iron studies, b12) Day to week later, see Dr. Janese Banks (ok for virtual visit if patient prefers)- [place]  Visit Diagnosis 1. Iron deficiency anemia, unspecified iron deficiency anemia type   2. B12 deficiency    Beckey Rutter, DNP, AGNP-C [place] [facility] [phone] (clinic) 07/17/2021

## 2021-07-17 NOTE — Addendum Note (Signed)
Addended by: Wilford Corner on: 07/17/2021 10:42 AM   Modules accepted: Orders

## 2021-07-23 ENCOUNTER — Other Ambulatory Visit: Payer: Self-pay | Admitting: Psychiatry

## 2021-07-23 DIAGNOSIS — F331 Major depressive disorder, recurrent, moderate: Secondary | ICD-10-CM

## 2021-07-27 ENCOUNTER — Telehealth: Payer: Self-pay

## 2021-07-27 NOTE — Telephone Encounter (Signed)
Recevied a medical records request from [facility] for medical records from 04/14/21 to 01/20/21. Printed and faxed records to them at [phone] ?

## 2021-08-31 ENCOUNTER — Other Ambulatory Visit: Payer: Self-pay | Admitting: Psychiatry

## 2021-08-31 DIAGNOSIS — F411 Generalized anxiety disorder: Secondary | ICD-10-CM

## 2021-08-31 DIAGNOSIS — F4310 Post-traumatic stress disorder, unspecified: Secondary | ICD-10-CM

## 2021-09-03 ENCOUNTER — Telehealth (INDEPENDENT_AMBULATORY_CARE_PROVIDER_SITE_OTHER): Payer: Medicare Other | Admitting: Psychiatry

## 2021-09-03 ENCOUNTER — Encounter: Payer: Self-pay | Admitting: Psychiatry

## 2021-09-03 DIAGNOSIS — F4310 Post-traumatic stress disorder, unspecified: Secondary | ICD-10-CM

## 2021-09-03 DIAGNOSIS — G4701 Insomnia due to medical condition: Secondary | ICD-10-CM | POA: Diagnosis not present

## 2021-09-03 DIAGNOSIS — F411 Generalized anxiety disorder: Secondary | ICD-10-CM

## 2021-09-03 DIAGNOSIS — F3342 Major depressive disorder, recurrent, in full remission: Secondary | ICD-10-CM | POA: Diagnosis not present

## 2021-09-03 DIAGNOSIS — Z634 Disappearance and death of family member: Secondary | ICD-10-CM

## 2021-09-03 MED ORDER — ARIPIPRAZOLE 5 MG PO TABS: 2.5000 mg | ORAL_TABLET | Freq: Every day | ORAL | 0 refills | Status: DC

## 2021-09-03 MED ORDER — DULOXETINE HCL 60 MG PO CPEP: 60.0000 mg | ORAL_CAPSULE | Freq: Two times a day (BID) | ORAL | 0 refills | Status: DC

## 2021-09-03 NOTE — Progress Notes (Signed)
Virtual Visit via Video Note ? ?I connected with [name] on 09/03/21 at  2:00 PM EDT by a video enabled telemedicine application and verified that I am speaking with the correct person using two identifiers. ? ?Location ?Provider Location : [facility] ?Patient Location : Car ? ?Participants: Patient , Provider ? ?  ?I discussed the limitations of evaluation and management by telemedicine and the availability of in person appointments. The patient expressed understanding and agreed to proceed. ? ? ?  ?I discussed the assessment and treatment plan with the patient. The patient was provided an opportunity to ask questions and all were answered. The patient agreed with the plan and demonstrated an understanding of the instructions. ?  ?The patient was advised to call back or seek an in-person evaluation if the symptoms worsen or if the condition fails to improve as anticipated. ? ?[place] MD OP Progress Note ? ?09/03/2021 2:31 PM ?Christine Mcneil  ?MRN:  [id number] ? ?Chief Complaint:  ?Chief Complaint  ?Patient presents with  ? Follow-up: 56 year old Caucasian female, with history of depression, anxiety, presented for medication management.  ? ?HPI: Christine Mcneil is a 56 year old Caucasian female, divorced, lives in [place], has a history of MDD, PTSD, insomnia, OSA-resolved, history of CVA, history of prothrombin gene mutation, chronic pain, migraine headaches, iron deficiency was evaluated by telemedicine today. ? ?Patient today reports overall she is doing well with regards to her mood.  Denies any sadness or anxiety symptoms. ? ?Currently compliant on medications.  Does report jerks of her lower right extremity.  Reports it comes and goes, happens during the day as well as night.  Agreeable to reducing the dosage of her Abilify, unknown if this could be contributing to it. ? ?Patient reports sleep is good on the Belsomra. ? ?Denies any suicidality, homicidality or perceptual disturbances. ? ?Patient denies any other concerns  today. ? ?Visit Diagnosis:  ?  ICD-10-CM   ?1. MDD (major depressive disorder), recurrent, in full remission ([place])  F33.42 ARIPiprazole (ABILIFY) 5 MG tablet  ?  ?2. PTSD (post-traumatic stress disorder)  F43.10   ?  ?3. GAD (generalized anxiety disorder)  F41.1   ?  ?4. Insomnia due to medical condition  G47.01   ? pain, mood  ?  ?5. Bereavement  Z63.4 DULoxetine (CYMBALTA) 60 MG capsule  ?  ? ? ?Past Psychiatric History: Reviewed past psychiatric history from progress note on 08/16/2017.  Past trials of Effexor, Klonopin. ? ?Past Medical History:  ?Past Medical History:  ?Diagnosis Date  ? Abdominal pain   ? Anxiety and depression   ? Atypical facial pain   ? Bilateral occipital neuralgia   ? Cervical spondylosis without myelopathy   ? Chronic daily headache   ? Chronic pain   ? Chronic pelvic pain in female   ? Chronic thoracic back pain   ? Depression   ? Diabetes mellitus without complication ([place])   ? Diabetes mellitus, type II ([place])   ? Dysphagia   ? Dysrhythmia   ? Fibromyalgia   ? Hyperlipidemia   ? Hypertension   ? Hypothyroidism   ? Insomnia   ? Iron deficiency anemia   ? Left hip pain   ? Low back pain   ? Migraines   ? Numbness   ? Optic neuropathy   ? OSA (obstructive sleep apnea)   ? Polyarthralgia   ? Primary osteoarthritis of both hips   ? Prothrombin gene mutation ([place])   ? Pseudotumor cerebri   ?  Rectal bleeding   ? Right shoulder pain   ? Rotator cuff syndrome   ? Stroke [facility])   ? Thyroid disease   ? TIA (transient ischemic attack) 05/27/2017  ?  ?Past Surgical History:  ?Procedure Laterality Date  ? ABDOMINAL HYSTERECTOMY    ? total  ? CHOLECYSTECTOMY    ? COLONOSCOPY WITH PROPOFOL N/A 03/21/2018  ? Procedure: COLONOSCOPY WITH PROPOFOL;  Surgeon: Lucilla Lame, MD;  Location: [facility] ENDOSCOPY;  Service: Endoscopy;  Laterality: N/A;  ? ERCP N/A 08/19/2020  ? Procedure: ENDOSCOPIC RETROGRADE CHOLANGIOPANCREATOGRAPHY (ERCP);  Surgeon: Lucilla Lame, MD;  Location: [facility] ENDOSCOPY;  Service: Endoscopy;   Laterality: N/A;  ? ESOPHAGOGASTRODUODENOSCOPY (EGD) WITH PROPOFOL N/A 03/21/2018  ? Procedure: ESOPHAGOGASTRODUODENOSCOPY (EGD) WITH PROPOFOL;  Surgeon: Lucilla Lame, MD;  [facility] END[facility]COPY;  Service: Endoscopy;  Laterality: N/A;  ? GIVENS CAPSULE STUDY N/A 09/24/2019  ? Procedure: GIVENS CAPSULE STUDY;  Surgeon: Virgel Manifold, MD;  Location: [facility] ENDOSCOPY;  Service: Endoscopy;  Laterality: N/A;  ? JOINT REPLACEMENT    ? KNEE SURGERY Left   ? ROTATOR CUFF REPAIR Right   ? spinal neurostimulator    ? ? ?Family Psychiatric History: Reviewed family psychiatric history from progress note on 08/16/2017. ? ?Family History:  ?Family History  ?Problem Relation Age of Onset  ? Diabetes Mother   ? Hyperlipidemia Mother   ? Hypertension Mother   ? COPD Mother   ? CVA Mother   ? Anemia Mother   ? Diabetes Father   ? Hyperlipidemia Father   ? Hypertension Father   ? Thyroid disease Father   ? Alcohol abuse Father   ? Peripheral vascular disease Sister   ? Hypertension Sister   ? Anxiety disorder Son   ? Depression Son   ? Bipolar disorder Son   ? Seizures Son   ? ? ?Social History: Reviewed social history from progress note on 08/16/2017. ?Social History  ? ?Socioeconomic History  ? Marital status: Divorced  ?  Spouse name: Not on file  ? Number of children: 1  ? Years of education: Not on file  ? Highest education level: High school graduate  ?Occupational History  ?  Comment: disablitiy  ?Tobacco Use  ? Smoking status: Never  ? Smokeless tobacco: Never  ?Vaping Use  ? Vaping Use: Never used  ?Substance and Sexual Activity  ? Alcohol use: No  ? Drug use: No  ? Sexual activity: Not Currently  ?Other Topics Concern  ? Not on file  ?Social History Narrative  ? Not on file  ? ?Social Determinants of Health  ? ?Financial Resource Strain: Not on file  ?Food Insecurity: Not on file  ?Transportation Needs: Not on file  ?Physical Activity: Not on file  ?Stress: Not on file  ?Social Connections: Not on file  ? ? ?Allergies:   ?Allergies  ?Allergen Reactions  ? Amoxapine Itching  ? Amoxicillin Itching  ? Dapagliflozin Itching  ?  Other reaction(s): Other (See Comments) ?Thrush & vaginal itching  ? Keflex [Cephalexin] Itching  ? Mirtazapine Other (See Comments)  ?  Pt states felling "like my body is outside of me."  ? ? ?Metabolic Disorder Labs: ?Lab Results  ?Component Value Date  ? HGBA1C 9.9 (H) 06/21/2017  ? MPG 237.43 06/21/2017  ? ?Lab Results  ?Component Value Date  ? PROLACTIN 4.3 (L) 05/26/2021  ? PROLACTIN 5.5 09/06/2017  ? ?Lab Results  ?Component Value Date  ? CHOL 157 05/26/2021  ? TRIG 245 (H) 05/26/2021  ?  HDL 32 (L) 05/26/2021  ? CHOLHDL 4.9 (H) 05/26/2021  ? VLDL 39 06/21/2017  ? [place] 84 05/26/2021  ? LDLCALC 174 (H) 06/21/2017  ? ?Lab Results  ?Component Value Date  ? TSH 2.890 09/06/2017  ? TSH 3.176 01/04/2017  ? ? ?Therapeutic Level Labs: ?No results found for: LITHIUM ?No results found for: VALPROATE ?No components found for:  CBMZ ? ?Current Medications: ?Current Outpatient Medications  ?Medication Sig Dispense Refill  ? Continuous Blood Gluc Receiver (DEXCOM G6 RECEIVER) DEVI Use to monitor blood sugar. [place] [phone]    ? Continuous Blood Gluc Sensor (DEXCOM G6 SENSOR) MISC Use to monitor blood sugar.  Replace every 10 days. [place] [phone].    ? Continuous Blood Gluc Transmit (DEXCOM G6 TRANSMITTER) MISC Use to monitor blood sugar.  Replace every 3 months. [place] [phone].    ? ACCU-CHEK AVIVA PLUS test strip     ? ACCU-CHEK SOFTCLIX LANCETS lancets     ? Acetaminophen (TYLENOL ARTHRITIS EXT RELIEF PO) Take 600 mg by mouth daily.    ? acetaZOLAMIDE (DIAMOX) 250 MG tablet     ? acetaZOLAMIDE (DIAMOX) 250 MG tablet Take by mouth.    ? AIMOVIG 70 MG/ML SOAJ Inject 70 mg into the skin every 28 (twenty-eight) days.    ? albuterol (VENTOLIN HFA) 108 (90 Base) MCG/ACT inhaler Inhale 2 puffs into the lungs every 6 (six) hours as needed for wheezing or shortness of breath. 1 g 0  ? ARIPiprazole (ABILIFY) 5 MG  tablet Take 0.5 tablets (2.5 mg total) by mouth daily. 90 tablet 0  ? aspirin-acetaminophen-caffeine (EXCEDRIN MIGRAINE) 250-250-65 MG tablet Take 2 tablets by mouth every 8 (eight) hours as needed for heada

## 2021-10-09 ENCOUNTER — Other Ambulatory Visit: Payer: Self-pay | Admitting: Psychiatry

## 2021-10-09 DIAGNOSIS — F411 Generalized anxiety disorder: Secondary | ICD-10-CM

## 2021-10-15 ENCOUNTER — Inpatient Hospital Stay: Payer: Medicare Other | Attending: Oncology

## 2021-10-20 ENCOUNTER — Other Ambulatory Visit: Payer: Self-pay | Admitting: Psychiatry

## 2021-10-20 DIAGNOSIS — F3342 Major depressive disorder, recurrent, in full remission: Secondary | ICD-10-CM

## 2021-10-23 ENCOUNTER — Other Ambulatory Visit: Payer: Self-pay | Admitting: Internal Medicine

## 2021-10-23 DIAGNOSIS — M7989 Other specified soft tissue disorders: Secondary | ICD-10-CM

## 2021-10-23 DIAGNOSIS — M79601 Pain in right arm: Secondary | ICD-10-CM

## 2021-10-29 ENCOUNTER — Ambulatory Visit
Admission: RE | Admit: 2021-10-29 | Discharge: 2021-10-29 | Disposition: A | Discharge status: Home / Self Care | Payer: Medicare Other | Source: Ambulatory Visit | Attending: Internal Medicine | Admitting: Internal Medicine

## 2021-10-29 DIAGNOSIS — M7989 Other specified soft tissue disorders: Secondary | ICD-10-CM | POA: Diagnosis present

## 2021-10-29 DIAGNOSIS — M79601 Pain in right arm: Secondary | ICD-10-CM

## 2021-11-13 ENCOUNTER — Telehealth: Payer: Self-pay

## 2021-11-13 DIAGNOSIS — G4701 Insomnia due to medical condition: Secondary | ICD-10-CM

## 2021-11-13 MED ORDER — BELSOMRA 5 MG PO TABS: 5.0000 mg | ORAL_TABLET | Freq: Every day | ORAL | 1 refills | Status: DC

## 2021-11-18 ENCOUNTER — Encounter: Payer: Self-pay | Admitting: Psychiatry

## 2021-11-18 ENCOUNTER — Ambulatory Visit (INDEPENDENT_AMBULATORY_CARE_PROVIDER_SITE_OTHER): Payer: Medicare Other | Admitting: Psychiatry

## 2021-11-18 VITALS — BP 137/85 | HR 93 | Temp 98.5°F | Wt 237.6 lb

## 2021-11-18 DIAGNOSIS — Z634 Disappearance and death of family member: Secondary | ICD-10-CM

## 2021-11-18 DIAGNOSIS — F3342 Major depressive disorder, recurrent, in full remission: Secondary | ICD-10-CM

## 2021-11-18 DIAGNOSIS — F5105 Insomnia due to other mental disorder: Secondary | ICD-10-CM

## 2021-11-18 DIAGNOSIS — F411 Generalized anxiety disorder: Secondary | ICD-10-CM | POA: Diagnosis not present

## 2021-11-18 DIAGNOSIS — F4310 Post-traumatic stress disorder, unspecified: Secondary | ICD-10-CM

## 2021-11-18 MED ORDER — ARIPIPRAZOLE 2 MG PO TABS: 1.0000 mg | ORAL_TABLET | Freq: Every day | ORAL | 0 refills | Status: DC

## 2021-11-18 MED ORDER — GABAPENTIN 600 MG PO TABS: ORAL_TABLET | ORAL | 0 refills | Status: DC

## 2021-11-18 MED ORDER — DULOXETINE HCL 60 MG PO CPEP: 60.0000 mg | ORAL_CAPSULE | Freq: Two times a day (BID) | ORAL | 0 refills | Status: DC

## 2021-11-18 NOTE — Progress Notes (Signed)
[facility] MD OP Progress Note  11/18/2021 12:16 PM Christine Mcneil  MRN:  [id number]  Chief Complaint:  Chief Complaint  Patient presents with   Follow-up: 56 year old Caucasian female with history of depression, anxiety, currently being tapered off of the Abilify presented for medication management.   HPI: Christine Mcneil is a 56 year old Caucasian female, divorced, lives in [place], has a history of MDD, PTSD, insomnia, OSA-resolved, history of CVA, history of prothrombin gene mutation, chronic pain, migraine headaches, iron deficiency was evaluated in office today.  Patient today reports she is currently tolerating the lower dosage of Abilify.  Has not had any significant depression or anxiety symptoms since being on the lower dosage.  She currently denies any muscular jerks on the lower dosage of Abilify.  Patient reports sleep as good.  Reports appetite is fair.  Denies any suicidality, homicidality or perceptual disturbances.  Currently compliant on medications.  Denies side effects.  Continues to struggle with pain, low back pain.  Currently on Xtampza ER.  Reports her medication is not effective for the pain.  She is currently planning to get an x-ray of her back.  Continues to follow-up with pain provider.  Denies any other concerns today.  Visit Diagnosis:    ICD-10-CM   1. MDD (major depressive disorder), recurrent, in full remission ([place])  F33.42 ARIPiprazole (ABILIFY) 2 MG tablet    2. PTSD (post-traumatic stress disorder)  F43.10 gabapentin (NEURONTIN) 600 MG tablet    3. GAD (generalized anxiety disorder)  F41.1 gabapentin (NEURONTIN) 600 MG tablet    4. Insomnia due to mental disorder  F51.05    Pain, mood    5. Bereavement  Z63.4 DULoxetine (CYMBALTA) 60 MG capsule      Past Psychiatric History: Reviewed past psychiatric history from progress note on 08/16/2017.  Past trials of Effexor, Klonopin.  Past Medical History:  Past Medical History:  Diagnosis Date   Abdominal  pain    Anxiety and depression    Atypical facial pain    Bilateral occipital neuralgia    Cervical spondylosis without myelopathy    Chronic daily headache    Chronic pain    Chronic pelvic pain in female    Chronic thoracic back pain    Depression    Diabetes mellitus without complication (HCC)    Diabetes mellitus, type II (HCC)    Dysphagia    Dysrhythmia    Fibromyalgia    Hyperlipidemia    Hypertension    Hypothyroidism    Insomnia    Iron deficiency anemia    Left hip pain    Low back pain    Migraines    Numbness    Optic neuropathy    OSA (obstructive sleep apnea)    Polyarthralgia    Primary osteoarthritis of both hips    Prothrombin gene mutation (HCC)    Pseudotumor cerebri    Rectal bleeding    Right shoulder pain    Rotator cuff syndrome    Stroke [facility])    Thyroid disease    TIA (transient ischemic attack) 05/27/2017    Past Surgical History:  Procedure Laterality Date   ABDOMINAL HYSTERECTOMY     total   CHOLECYSTECTOMY     COLONOSCOPY WITH PROPOFOL N/A 03/21/2018   Procedure: COLONOSCOPY WITH PROPOFOL;  Surgeon: Lucilla Lame, MD;  Location: [facility] ENDOSCOPY;  Service: Endoscopy;  Laterality: N/A;   ERCP N/A 08/19/2020   Procedure: ENDOSCOPIC RETROGRADE CHOLANGIOPANCREATOGRAPHY (ERCP);  Surgeon: Lucilla Lame, MD;  Location: [facility]  ENDOSCOPY;  Service: Endoscopy;  Laterality: N/A;   ESOPHAGOGASTRODUODENOSCOPY (EGD) WITH PROPOFOL N/A 03/21/2018   Procedure: ESOPHAGOGASTRODUODENOSCOPY (EGD) WITH PROPOFOL;  Surgeon: Lucilla Lame, MD;  Location: [facility] ENDOSCOPY;  Service: Endoscopy;  Laterality: N/A;   GIVENS CAPSULE STUDY N/A 09/24/2019   Procedure: GIVENS CAPSULE STUDY;  Surgeon: Virgel Manifold, MD;  Location: [facility] ENDOSCOPY;  Service: Endoscopy;  Laterality: N/A;   JOINT REPLACEMENT     KNEE SURGERY Left    ROTATOR CUFF REPAIR Right    spinal neurostimulator      Family Psychiatric History: Reviewed family psychiatric history from progress note on  08/16/2017.  Family History:  Family History  Problem Relation Age of Onset   Diabetes Mother    Hyperlipidemia Mother    Hypertension Mother    COPD Mother    CVA Mother    Anemia Mother    Diabetes Father    Hyperlipidemia Father    Hypertension Father    Thyroid disease Father    Alcohol abuse Father    Peripheral vascular disease Sister    Hypertension Sister    Anxiety disorder Son    Depression Son    Bipolar disorder Son    Seizures Son     Social History: Reviewed social history from progress note on 08/16/2017. Social History   Socioeconomic History   Marital status: Divorced    Spouse name: Not on file   Number of children: 1   Years of education: Not on file   Highest education level: High school graduate  Occupational History    Comment: disablitiy  Tobacco Use   Smoking status: Never   Smokeless tobacco: Never  Vaping Use   Vaping Use: Never used  Substance and Sexual Activity   Alcohol use: No   Drug use: No   Sexual activity: Not Currently  Other Topics Concern   Not on file  Social History Narrative   Not on file   Social Determinants of Health   Financial Resource Strain: Low Risk  (07/19/2017)   Overall Financial Resource Strain (CARDIA)    Difficulty of Paying Living Expenses: Not hard at all  Food Insecurity: No Food Insecurity (07/19/2017)   Hunger Vital Sign    Worried About Running Out of Food in the Last Year: Never true    [place] in the Last Year: Never true  Transportation Needs: No Transportation Needs (07/19/2017)   PRAPARE - Hydrologist (Medical): No    Lack of Transportation (Non-Medical): No  Physical Activity: Inactive (07/19/2017)   Exercise Vital Sign    Days of Exercise per Week: 0 days    Minutes of Exercise per Session: 0 min  Stress: Stress Concern Present (07/19/2017)   [place]    Feeling of Stress : Very much   Social Connections: Moderately Isolated (07/19/2017)   Social Connection and Isolation Panel [NHANES]    Frequency of Communication with Friends and Family: Once a week    Frequency of Social Gatherings with Friends and Family: Never    Attends Religious Services: More than 4 times per year    Active Member of [employer] or Organizations: No    Attends Archivist Meetings: Never    Marital Status: Divorced    Allergies:  Allergies  Allergen Reactions   Amoxapine Itching   Amoxicillin Itching   Dapagliflozin Itching    Other reaction(s): Other (See Comments) Thrush & vaginal  itching   Keflex [Cephalexin] Itching   Mirtazapine Other (See Comments)    Pt states felling "like my body is outside of me."    Metabolic Disorder Labs: Lab Results  Component Value Date   HGBA1C 9.9 (H) 06/21/2017   MPG 237.43 06/21/2017   Lab Results  Component Value Date   PROLACTIN 4.3 (L) 05/26/2021   PROLACTIN 5.5 09/06/2017   Lab Results  Component Value Date   CHOL 157 05/26/2021   TRIG 245 (H) 05/26/2021   HDL 32 (L) 05/26/2021   CHOLHDL 4.9 (H) 05/26/2021   VLDL 39 06/21/2017   LDLCALC 84 05/26/2021   LDLCALC 174 (H) 06/21/2017   Lab Results  Component Value Date   TSH 2.890 09/06/2017   TSH 3.176 01/04/2017    Therapeutic Level Labs: No results found for: "LITHIUM" No results found for: "VALPROATE" No results found for: "CBMZ"  Current Medications: Current Outpatient Medications  Medication Sig Dispense Refill   ACCU-CHEK AVIVA PLUS test strip      ACCU-CHEK SOFTCLIX LANCETS lancets      Acetaminophen (TYLENOL ARTHRITIS EXT RELIEF PO) Take 600 mg by mouth daily.     acetaZOLAMIDE (DIAMOX) 250 MG tablet Take by mouth.     AIMOVIG 70 MG/ML SOAJ Inject 70 mg into the skin every 28 (twenty-eight) days.     albuterol (VENTOLIN HFA) 108 (90 Base) MCG/ACT inhaler Inhale 2 puffs into the lungs every 6 (six) hours as needed for wheezing or shortness of breath. 1 g 0    ARIPiprazole (ABILIFY) 2 MG tablet Take 0.5 tablets (1 mg total) by mouth daily. 45 tablet 0   aspirin-acetaminophen-caffeine (EXCEDRIN MIGRAINE) 250-250-65 MG tablet Take 2 tablets by mouth every 8 (eight) hours as needed for headache or migraine.      atorvastatin (LIPITOR) 80 MG tablet Take 80 mg by mouth daily at 6 PM.     Blood Glucose Monitoring Suppl (ACCU-CHEK AVIVA PLUS) w/Device KIT      Carboxymethylcellulose Sod PF 0.5 % SOLN Apply 1 drop to eye daily as needed.     Cholecalciferol (VITAMIN D3) 1000 units CAPS Take 1 capsule by mouth daily.     clobetasol ointment (TEMOVATE) 0.05 % Apply topically.     clotrimazole (LOTRIMIN) 1 % cream Apply 1 application topically daily as needed.     Continuous Blood Gluc Receiver (DEXCOM G6 RECEIVER) DEVI Use to monitor blood sugar. [place] [phone]     Continuous Blood Gluc Sensor (DEXCOM G6 SENSOR) MISC Use to monitor blood sugar.  Replace every 10 days. [place] [phone].     Continuous Blood Gluc Transmit (DEXCOM G6 TRANSMITTER) MISC Use to monitor blood sugar.  Replace every 3 months. [place] [phone].     cyclobenzaprine (FLEXERIL) 5 MG tablet Take 1 tablet by mouth 3 (three) times daily as needed.     diltiazem (TIAZAC) 240 MG 24 hr capsule TAKE (1) CAPSULE BY MOUTH EVERY DAY     EMGALITY 120 MG/ML SOAJ Inject into the skin.     esomeprazole (NEXIUM) 40 MG capsule Take 40 mg by mouth daily.     ezetimibe (ZETIA) 10 MG tablet ezetimibe 10 mg tablet     famotidine (PEPCID) 40 MG tablet Take 40 mg by mouth 2 (two) times daily.     ferrous sulfate 325 (65 FE) MG tablet Take by mouth. Take 325 mg daily with breakfast     fluticasone (FLONASE) 50 MCG/ACT nasal spray Place 2 sprays into both nostrils as needed.  furosemide (LASIX) 40 MG tablet TAKE (1) TABLET BY MOUTH EVERY DAY     hydrOXYzine (VISTARIL) 25 MG capsule TAKE 1 CAPSULE BY MOUTH TWICE DAILY AS NEEDED. FOR ANXIETY 180 capsule 0   insulin degludec (TRESIBA FLEXTOUCH) 200 UNIT/ML  FlexTouch Pen Inject into the skin.     Insulin Lispro w/ Trans Port 100 UNIT/ML SOPN INJECT UP TO 50 UNITS DAILY DIVIDED DOSES, AS DIRECTED     Insulin Pen Needle (TRUEPLUS PEN NEEDLES) 29G X 12MM MISC      lamoTRIgine (LAMICTAL) 100 MG tablet Take 100 mg by mouth daily.     levothyroxine (SYNTHROID, LEVOTHROID) 100 MCG tablet Take 100 mcg by mouth daily.     losartan (COZAAR) 25 MG tablet Take 12.5 mg by mouth daily.     magnesium oxide (MAG-OX) 400 MG tablet Take 1 tablet by mouth daily.     Melatonin 10 MG TABS Take 2 tablets by mouth at bedtime.     meloxicam (MOBIC) 15 MG tablet Take 15 mg by mouth daily.     metFORMIN (GLUCOPHAGE-XR) 500 MG 24 hr tablet Take 500 mg by mouth 2 (two) times daily.      metoprolol succinate (TOPROL-XL) 25 MG 24 hr tablet Take 1 tablet by mouth daily.     mometasone (ELOCON) 0.1 % cream Apply topically.     naloxone (NARCAN) nasal spray 4 mg/0.1 mL Place into the nose.     nitroGLYCERIN (NITROSTAT) 0.4 MG SL tablet Place under the tongue.     nystatin (MYCOSTATIN) 100000 UNIT/ML suspension Take 5 mLs by mouth 4 (four) times daily.     omeprazole (PRILOSEC) 20 MG capsule TAKE (1) CAPSULE BY MOUTH EVERY DAY 30 capsule 0   ondansetron (ZOFRAN) 4 MG tablet Take 4 mg by mouth at bedtime.     quinapril (ACCUPRIL) 10 MG tablet Take 10 mg by mouth daily.     Riboflavin 400 MG TABS Take 1 tablet by mouth every night  for headaches     Rimegepant Sulfate (NURTEC) 75 MG TBDP Take 1 tablet by mouth as needed.      Semaglutide 7 MG TABS Take by mouth.     Suvorexant (BELSOMRA) 5 MG TABS Take 5 mg by mouth at bedtime. 30 tablet 1   vitamin B-12 (CYANOCOBALAMIN) 100 MCG tablet Take 1 tablet (100 mcg total) by mouth daily. 90 tablet 1   XTAMPZA ER 9 MG C12A PLEASE SEE ATTACHED FOR DETAILED DIRECTIONS     dabigatran (PRADAXA) 150 MG CAPS capsule Take 150 mg by mouth 2 (two) times daily.     DULoxetine (CYMBALTA) 60 MG capsule Take 1 capsule (60 mg total) by mouth 2 (two)  times daily. 180 capsule 0   gabapentin (NEURONTIN) 600 MG tablet TAKE (1) TABLET BY MOUTH TWICE DAILY 180 tablet 0   mupirocin ointment (BACTROBAN) 2 % mupirocin 2 % topical ointment (Patient not taking: Reported on 11/18/2021)     pioglitazone (ACTOS) 15 MG tablet Take by mouth. Take 1 tablet by mouth once daily     No current facility-administered medications for this visit.     Musculoskeletal: Strength & Muscle Tone: within normal limits Gait & Station: normal Patient leans: N/A  Psychiatric Specialty Exam: Review of Systems  Musculoskeletal:  Positive for back pain.  Psychiatric/Behavioral: Negative.    All other systems reviewed and are negative.   Blood pressure 137/85, pulse 93, temperature 98.5 F (36.9 C), temperature source Temporal, weight 237 lb 9.6 oz (107.8 kg).Body  mass index is 38.35 kg/m.  General Appearance: Casual  Eye Contact:  Good  Speech:  Clear and Coherent  Volume:  Normal  Mood:  Euthymic  Affect:  Congruent  Thought Process:  Goal Directed and Descriptions of Associations: Intact  Orientation:  Full (Time, Place, and Person)  Thought Content: Logical   Suicidal Thoughts:  No  Homicidal Thoughts:  No  Memory:  Immediate;   Fair Recent;   Fair Remote;   Fair  Judgement:  Fair  Insight:  Fair  Psychomotor Activity:  Normal  Concentration:  Concentration: Fair and Attention Span: Fair  Recall:  [employer] of Knowledge: Fair  Language: Fair  Akathisia:  No  Handed:  Right  AIMS (if indicated): done  Assets:  Communication Skills Desire for Improvement Housing Social Support  ADL's:  Intact  Cognition: WNL  Sleep:  Fair   Screenings: [place] Office Visit from 11/18/2021 in [place] Video Visit from 09/03/2021 in [place] Total Score 0 3      GAD-7    Flowsheet [place] Video Visit from 06/23/2021 in [place] Office Visit  from 05/26/2021 in [place] Video Visit from 01/22/2021 in [place] from 01/19/2021 in [place]  Total GAD-7 Score 0 _0 PHQ2-9    [place] Visit from 11/18/2021 in [place] Video Visit from 09/03/2021 in [place] Video Visit from 06/23/2021 in [place] from 05/26/2021 in [place] Video Visit from 01/22/2021 in [place]  PHQ-2 Total Score 2 0 0 1 1  PHQ-9 Total Score 5 -- -- 7 2      [place] Office Visit from 11/18/2021 in [place] Video Visit from 09/03/2021 in [place] Video Visit from 06/05/2021 in [place] No Risk Low Risk No Risk        Assessment and Plan: Christine Mcneil is a 56 year old Caucasian female who has a history of MDD, PTSD, chronic pain, migraine headaches, history of CVA, iron deficiency, prothrombin gene mutation was evaluated in office today.  Patient continues to struggle with pain, currently tolerating lower dosage of Abilify, will benefit from the following plan.  Plan MDD in remission Cymbalta 120 mg p.o. daily in divided dosage Lamotrigine 100 mg p.o. daily-prescribed by neurology Gabapentin 600 mg p.o. twice daily Reduce Abilify to 1 mg p.o. daily.  Long-term plan is to taper it off. AIMS - 0  GAD-stable Cymbalta and gabapentin as prescribed  Insomnia-stable Belsomra 5 mg p.o. nightly Reviewed [place] PMP aware  Bereavement-in remission Continue CBT  Follow-up in clinic in 2 months or sooner if needed.  This note was generated in part or whole with voice recognition software. Voice recognition is usually quite accurate but there are transcription errors  that can and very often do occur. I apologize for any typographical errors that were not detected and corrected.      Ursula Alert, MD 11/18/2021, 12:16 PM

## 2021-12-01 IMAGING — RF DG ESOPHAGUS
9 of 12 series · 14 of 23 positions shown · non-contrast
Comparison: CT 12/12/2016

CLINICAL DATA: Hemoptysis, dysphagia

EXAM:
ESOPHOGRAM / BARIUM SWALLOW / BARIUM TABLET STUDY
TECHNIQUE: Combined double contrast and single contrast examination performed
using effervescent crystals, thick barium liquid, and thin barium
liquid. The patient was observed with fluoroscopy swallowing a 13 mm
barium sulphate tablet.
FLUOROSCOPY TIME:  Fluoroscopy Time:  1 minutes 24 seconds
Radiation Exposure Index (if provided by the fluoroscopic device):
54.6 mGy
Number of Acquired Spot Images: 14

[Series 1: fluoro_barium 2fps_bw · 0.17mm/px · 2 of 3 frames shown (1 of 6)]
[frame 1/3]
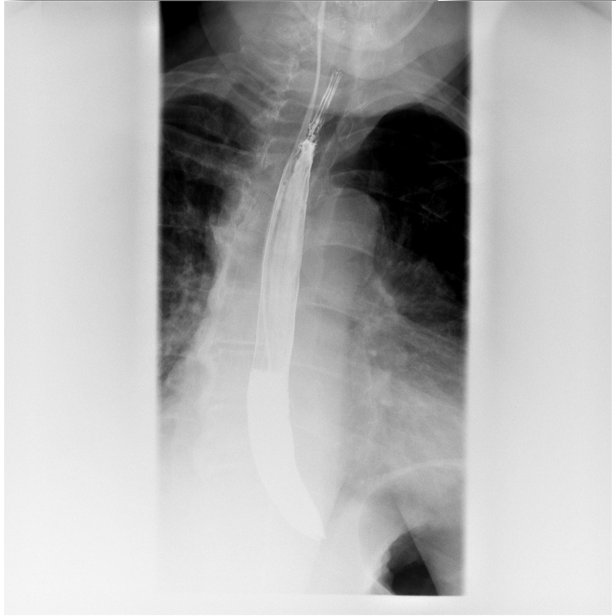
[frame 3/3]
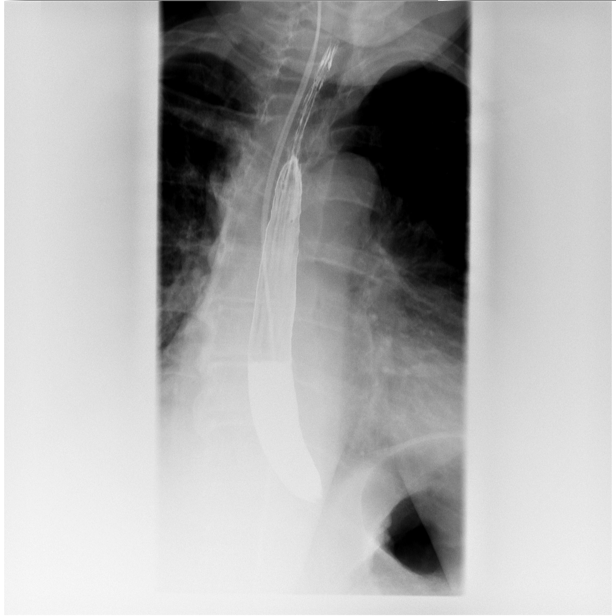

[Series 2: fluoro_barium 2fps_bw · 0.17mm/px · 2 of 5 frames shown (2 of 6)]
[frame 3/5]
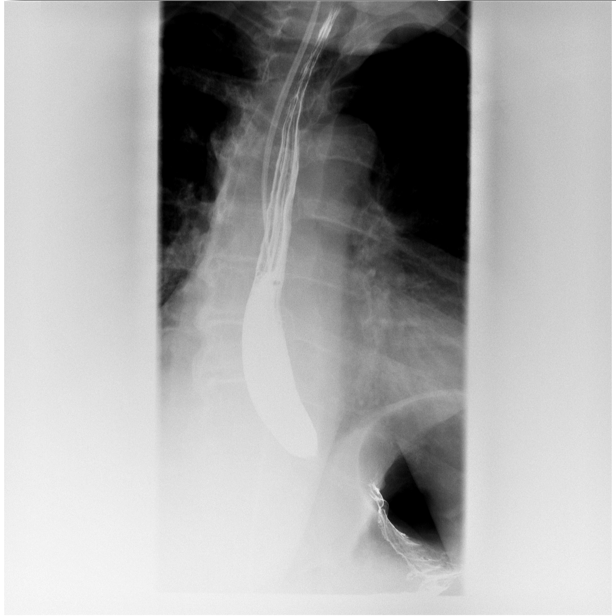
[frame 5/5]
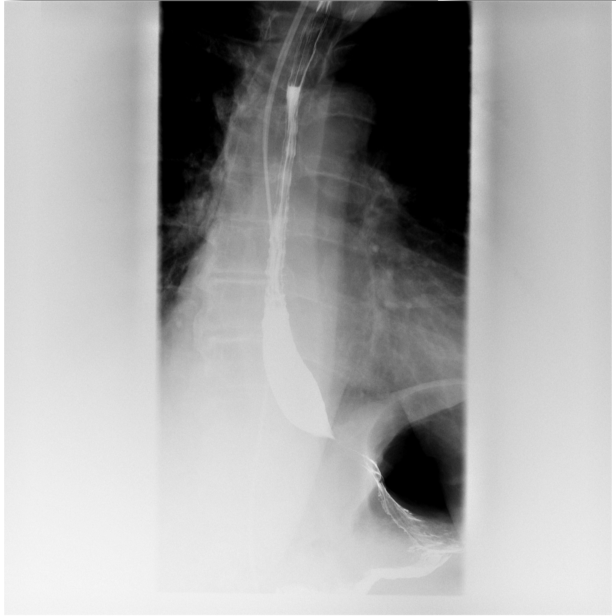

[Series 5: fluoro_barium 2fps_bw · 0.17mm/px · 1 of 2 frames shown (3 of 6)]
[frame 1/2]
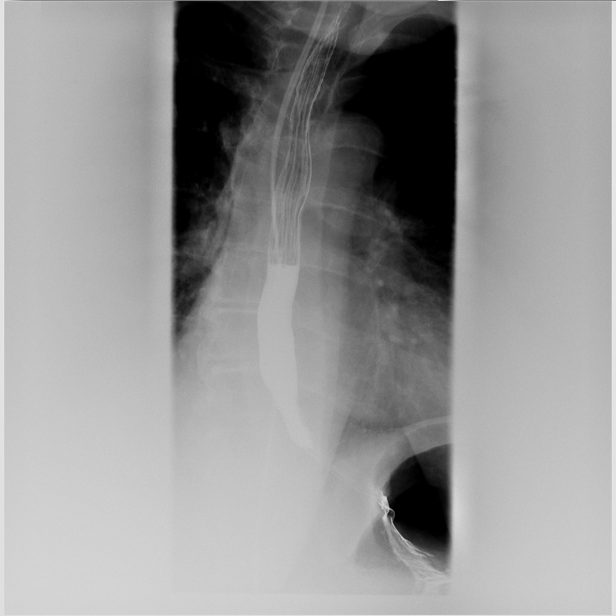

[Series 6: fluoro_barium 2fps_bw · 0.17mm/px · 1 of 1 slices shown (4 of 6)]
[im 1/1]
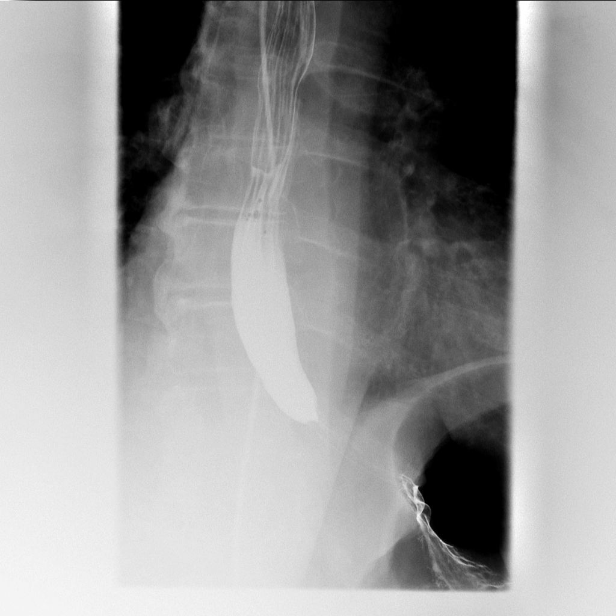

[Series 7: fluoro_barium 2fps_bw · 0.17mm/px · 2 of 4 frames shown (5 of 6)]
[frame 1/4]
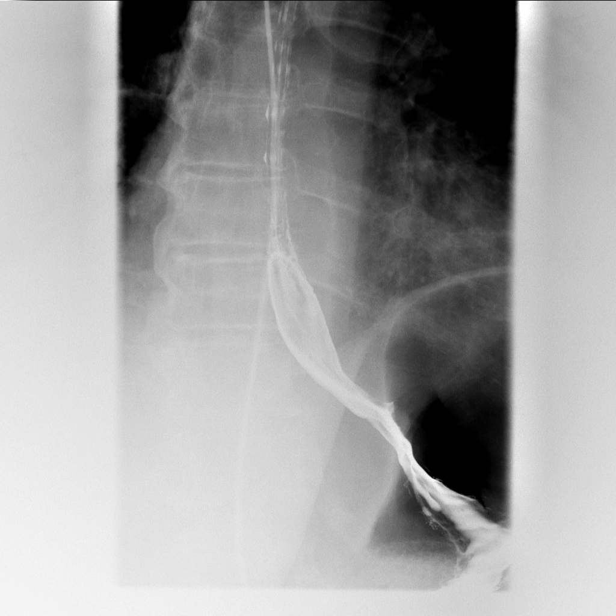
[frame 4/4]
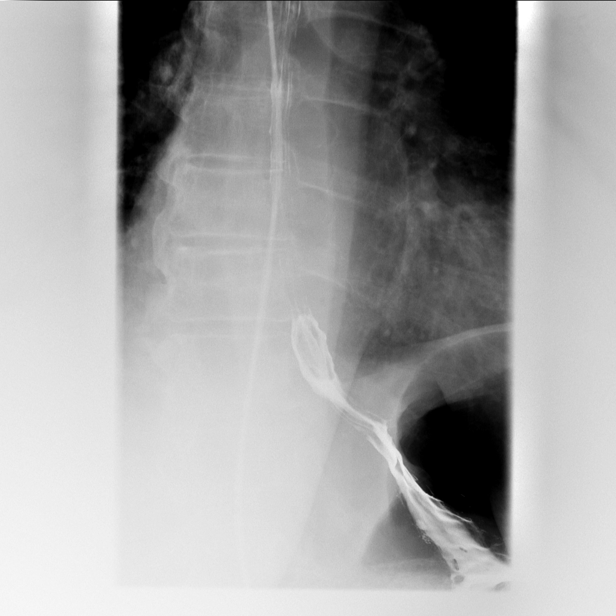

[Series 8: fluoro_barium 2fps_bw · 0.17mm/px · 1 of 2 frames shown (6 of 6)]
[frame 1/2]
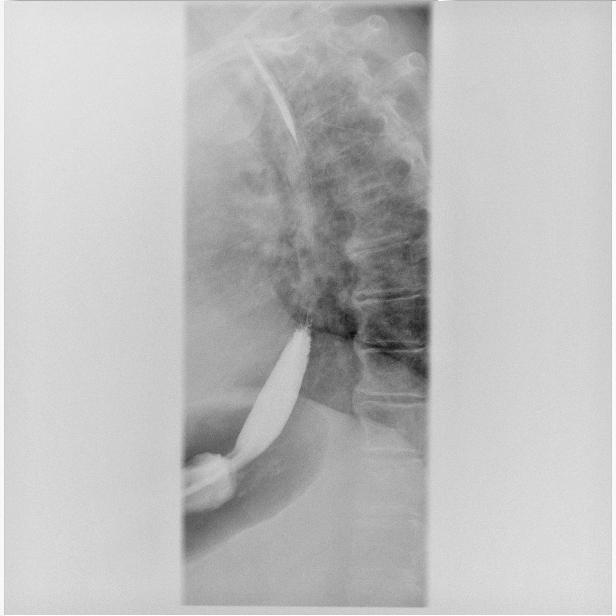

[Series 9: cp_standard · 0.26mm/px · 3 of 29 frames shown (1 of 3)]
[frame 5/29]
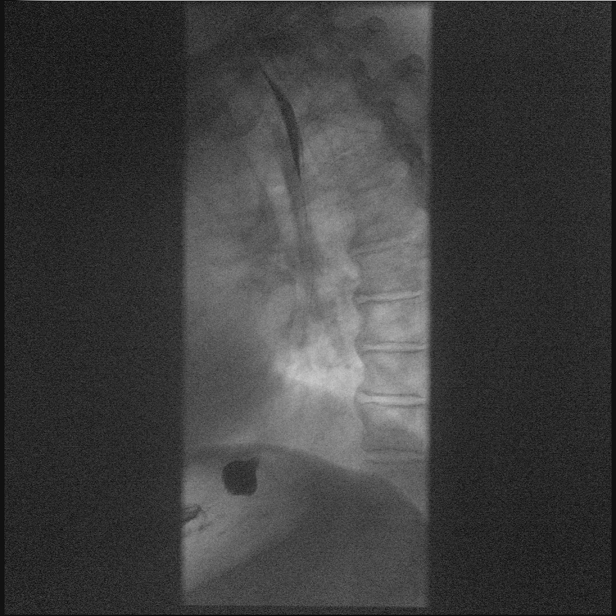
[frame 15/29]
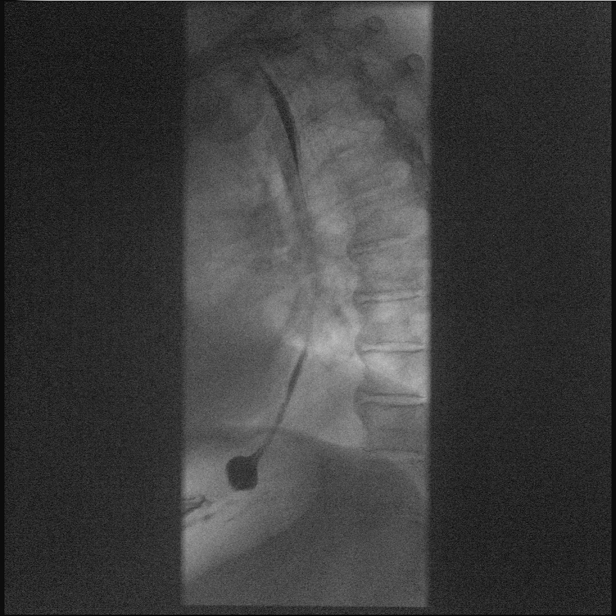
[frame 25/29]
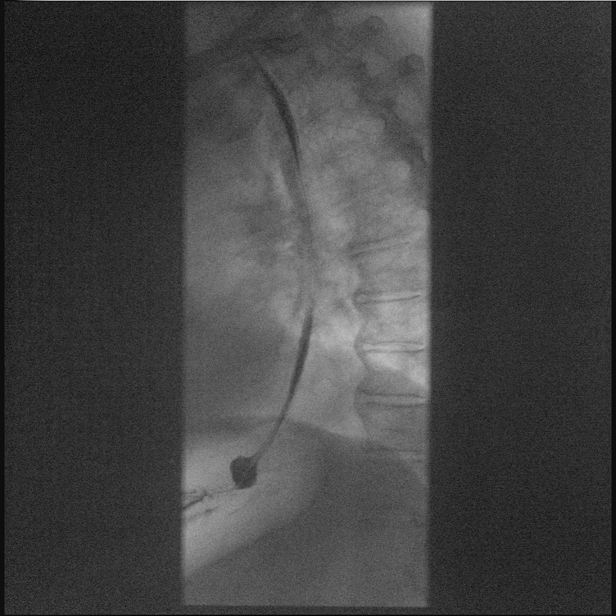

[Series 11: cp_standard · 0.26mm/px · 1 of 1 slices shown (2 of 3)]
[im 1/1]
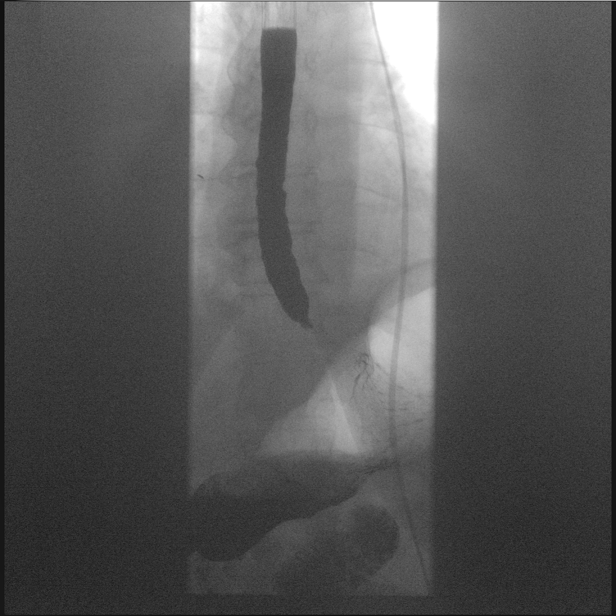

[Series 13: cp_standard · 0.26mm/px · 1 of 1 slices shown (3 of 3)]
[im 1/1]
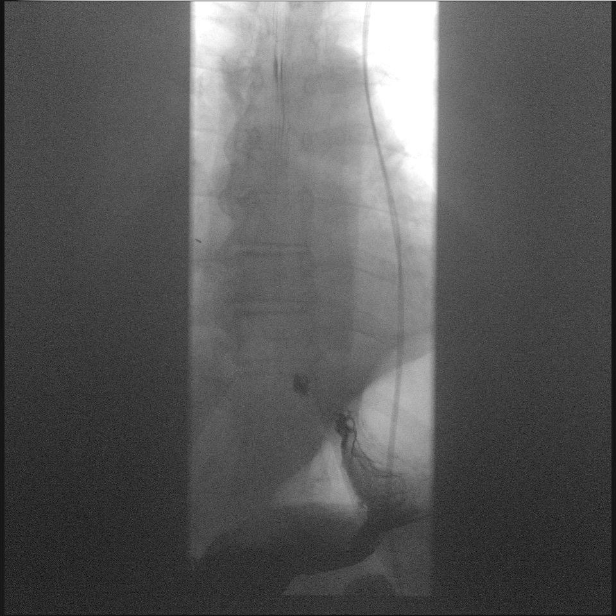

[14 of 23 positions shown; findings below may reference images not displayed]

FINDINGS: Normal primary peristaltic wave. Occasional mild intermittent
tertiary contractions. Esophagus distends normally. No persisting
stricture. No discrete mass or ulceration. Spontaneous
gastroesophageal reflux was evident to the level of the
midesophagus. Normal positioning of the gastroesophageal junction
without hiatal hernia. At the conclusion of the study, a 13 mm
barium tablet was administered. This passed without delay into the
stomach.
IMPRESSION: 1. Spontaneous gastroesophageal reflux to the level of the
midesophagus.
2. Occasional mild intermittent tertiary contractions. Otherwise
unremarkable motility.
3. No persisting stricture or focal mucosal abnormality.

## 2021-12-30 ENCOUNTER — Ambulatory Visit (INDEPENDENT_AMBULATORY_CARE_PROVIDER_SITE_OTHER): Payer: Medicare Other | Admitting: Psychiatry

## 2021-12-30 ENCOUNTER — Encounter: Payer: Self-pay | Admitting: Psychiatry

## 2021-12-30 VITALS — BP 144/90 | HR 86 | Temp 97.9°F | Wt 232.4 lb

## 2021-12-30 DIAGNOSIS — F411 Generalized anxiety disorder: Secondary | ICD-10-CM | POA: Diagnosis not present

## 2021-12-30 DIAGNOSIS — F4310 Post-traumatic stress disorder, unspecified: Secondary | ICD-10-CM

## 2021-12-30 DIAGNOSIS — G4701 Insomnia due to medical condition: Secondary | ICD-10-CM | POA: Diagnosis not present

## 2021-12-30 DIAGNOSIS — F3342 Major depressive disorder, recurrent, in full remission: Secondary | ICD-10-CM

## 2021-12-30 DIAGNOSIS — Z634 Disappearance and death of family member: Secondary | ICD-10-CM

## 2021-12-30 MED ORDER — HYDROXYZINE PAMOATE 25 MG PO CAPS: ORAL_CAPSULE | ORAL | 0 refills | Status: DC

## 2021-12-30 MED ORDER — BELSOMRA 10 MG PO TABS: 10.0000 mg | ORAL_TABLET | Freq: Every evening | ORAL | 0 refills | Status: DC | PRN

## 2021-12-30 NOTE — Progress Notes (Unsigned)
[facility] Progress Note  12/30/2021 11:03 AM Christine Mcneil  MRN:  [id number]  Chief Complaint:  Chief Complaint  Patient presents with   Follow-up: 56 year old Caucasian female with history of depression, anxiety, currently presents with sleep problems.   HPI: Christine Mcneil is a 56 year old Caucasian female, divorced, lives in [place], has a history of MDD, PTSD, insomnia, OSA-resolved, history of CVA, history of prothrombin gene mutation, chronic pain, migraine headaches, iron deficiency was evaluated in office today.  Patient today reports since being on the lower dosage of Abilify she has not had any worsening mood symptoms.  Patient however reports she continues to have racing thoughts at night although it has not worsened since being on the lower dosage of Abilify.  She reports she sleeps only for 3 to 4 hours at night and when she wakes up she has a lot of racing thoughts.  She is in a lot of pain of her back right now.  She reports she has upcoming appointment with pain management.  Patient could also be affecting her sleep.  Currently taking Belsomra 5 mg.  Tolerating it well.  Agreeable to dosage increase.  Patient otherwise compliant on medications.  Denies side effects.  Denies any suicidality, homicidality or perceptual disturbances.  Patient denies any other concerns today.  Visit Diagnosis:    ICD-10-CM   1. MDD (major depressive disorder), recurrent, in full remission ([place])  F33.42     2. PTSD (post-traumatic stress disorder)  F43.10     3. GAD (generalized anxiety disorder)  F41.1 hydrOXYzine (VISTARIL) 25 MG capsule    4. Insomnia due to medical condition  G47.01 Suvorexant (BELSOMRA) 10 MG TABS   mood, pain    5. Bereavement  Z63.4       Past Psychiatric History: Reviewed past psychiatric history from progress note on 08/16/2017.  Past trials of Effexor, Klonopin.  Past Medical History:  Past Medical History:  Diagnosis Date   Abdominal pain    Anxiety and  depression    Atypical facial pain    Bilateral occipital neuralgia    Cervical spondylosis without myelopathy    Chronic daily headache    Chronic pain    Chronic pelvic pain in female    Chronic thoracic back pain    Depression    Diabetes mellitus without complication (HCC)    Diabetes mellitus, type II (HCC)    Dysphagia    Dysrhythmia    Fibromyalgia    Hyperlipidemia    Hypertension    Hypothyroidism    Insomnia    Iron deficiency anemia    Left hip pain    Low back pain    Migraines    Numbness    Optic neuropathy    OSA (obstructive sleep apnea)    Polyarthralgia    Primary osteoarthritis of both hips    Prothrombin gene mutation (HCC)    Pseudotumor cerebri    Rectal bleeding    Right shoulder pain    Rotator cuff syndrome    Stroke [facility])    Thyroid disease    TIA (transient ischemic attack) 05/27/2017    Past Surgical History:  Procedure Laterality Date   ABDOMINAL HYSTERECTOMY     total   CHOLECYSTECTOMY     COLONOSCOPY WITH PROPOFOL N/A 03/21/2018   Procedure: COLONOSCOPY WITH PROPOFOL;  Surgeon: Lucilla Lame, MD;  Location: [facility] ENDOSCOPY;  Service: Endoscopy;  Laterality: N/A;   ERCP N/A 08/19/2020   Procedure: ENDOSCOPIC RETROGRADE CHOLANGIOPANCREATOGRAPHY (ERCP);  Surgeon: Lucilla Lame, MD;  Location: [facility] ENDOSCOPY;  Service: Endoscopy;  Laterality: N/A;   ESOPHAGOGASTRODUODENOSCOPY (EGD) WITH PROPOFOL N/A 03/21/2018   Procedure: ESOPHAGOGASTRODUODENOSCOPY (EGD) WITH PROPOFOL;  Surgeon: Lucilla Lame, MD;  Location: [facility] ENDOSCOPY;  Service: Endoscopy;  Laterality: N/A;   GIVENS CAPSULE STUDY N/A 09/24/2019   Procedure: GIVENS CAPSULE STUDY;  Surgeon: Virgel Manifold, MD;  Location: [facility] ENDOSCOPY;  Service: Endoscopy;  Laterality: N/A;   JOINT REPLACEMENT     KNEE SURGERY Left    ROTATOR CUFF REPAIR Right    spinal neurostimulator      Family Psychiatric History: Reviewed family psychiatric history from progress note on 08/16/2017.  Family  History:  Family History  Problem Relation Age of Onset   Diabetes Mother    Hyperlipidemia Mother    Hypertension Mother    COPD Mother    CVA Mother    Anemia Mother    Diabetes Father    Hyperlipidemia Father    Hypertension Father    Thyroid disease Father    Alcohol abuse Father    Peripheral vascular disease Sister    Hypertension Sister    Anxiety disorder Son    Depression Son    Bipolar disorder Son    Seizures Son     Social History: Reviewed social history from progress note on 08/16/2017. Social History   Socioeconomic History   Marital status: Divorced    Spouse name: Not on file   Number of children: 1   Years of education: Not on file   Highest education level: High school graduate  Occupational History    Comment: disablitiy  Tobacco Use   Smoking status: Never   Smokeless tobacco: Never  Vaping Use   Vaping Use: Never used  Substance and Sexual Activity   Alcohol use: No   Drug use: No   Sexual activity: Not Currently  Other Topics Concern   Not on file  Social History Narrative   Not on file   Social Determinants of Health   Financial Resource Strain: Low Risk  (07/19/2017)   Overall Financial Resource Strain (CARDIA)    Difficulty of Paying Living Expenses: Not hard at all  Food Insecurity: No Food Insecurity (07/19/2017)   Hunger Vital Sign    Worried About Running Out of Food in the Last Year: Never true    [place] in the Last Year: Never true  Transportation Needs: No Transportation Needs (07/19/2017)   PRAPARE - Hydrologist (Medical): No    Lack of Transportation (Non-Medical): No  Physical Activity: Inactive (07/19/2017)   Exercise Vital Sign    Days of Exercise per Week: 0 days    Minutes of Exercise per Session: 0 min  Stress: Stress Concern Present (07/19/2017)   [place]    Feeling of Stress : Very much  Social Connections:  Moderately Isolated (07/19/2017)   Social Connection and Isolation Panel [NHANES]    Frequency of Communication with Friends and Family: Once a week    Frequency of Social Gatherings with Friends and Family: Never    Attends Religious Services: More than 4 times per year    Active Member of [employer] or Organizations: No    Attends Archivist Meetings: Never    Marital Status: Divorced    Allergies:  Allergies  Allergen Reactions   Amoxapine Itching   Amoxicillin Itching   Dapagliflozin Itching    Other  reaction(s): Other (See Comments) Thrush & vaginal itching   Keflex [Cephalexin] Itching   Mirtazapine Other (See Comments)    Pt states felling "like my body is outside of me."    Metabolic Disorder Labs: Lab Results  Component Value Date   HGBA1C 9.9 (H) 06/21/2017   MPG 237.43 06/21/2017   Lab Results  Component Value Date   PROLACTIN 4.3 (L) 05/26/2021   PROLACTIN 5.5 09/06/2017   Lab Results  Component Value Date   CHOL 157 05/26/2021   TRIG 245 (H) 05/26/2021   HDL 32 (L) 05/26/2021   CHOLHDL 4.9 (H) 05/26/2021   VLDL 39 06/21/2017   LDLCALC 84 05/26/2021   LDLCALC 174 (H) 06/21/2017   Lab Results  Component Value Date   TSH 2.890 09/06/2017   TSH 3.176 01/04/2017    Therapeutic Level Labs: No results found for: "LITHIUM" No results found for: "VALPROATE" No results found for: "CBMZ"  Current Medications: Current Outpatient Medications  Medication Sig Dispense Refill   ACCU-CHEK AVIVA PLUS test strip      ACCU-CHEK SOFTCLIX LANCETS lancets      Acetaminophen (TYLENOL ARTHRITIS EXT RELIEF PO) Take 600 mg by mouth daily.     acetaZOLAMIDE (DIAMOX) 250 MG tablet Take by mouth.     albuterol (VENTOLIN HFA) 108 (90 Base) MCG/ACT inhaler Inhale 2 puffs into the lungs every 6 (six) hours as needed for wheezing or shortness of breath. 1 g 0   ARIPiprazole (ABILIFY) 2 MG tablet Take 0.5 tablets (1 mg total) by mouth daily. 45 tablet 0    aspirin-acetaminophen-caffeine (EXCEDRIN MIGRAINE) 250-250-65 MG tablet Take 2 tablets by mouth every 8 (eight) hours as needed for headache or migraine.      atorvastatin (LIPITOR) 80 MG tablet Take 80 mg by mouth daily at 6 PM.     Blood Glucose Monitoring Suppl (ACCU-CHEK AVIVA PLUS) w/Device KIT      Carboxymethylcellulose Sod PF 0.5 % SOLN Apply 1 drop to eye daily as needed.     Cholecalciferol (VITAMIN D3) 1000 units CAPS Take 1 capsule by mouth daily.     clobetasol ointment (TEMOVATE) 0.05 % Apply topically.     clotrimazole (LOTRIMIN) 1 % cream Apply 1 application topically daily as needed.     Continuous Blood Gluc Receiver (DEXCOM G6 RECEIVER) DEVI Use to monitor blood sugar. [place] [phone]     Continuous Blood Gluc Sensor (DEXCOM G6 SENSOR) MISC Use to monitor blood sugar.  Replace every 10 days. [place] [phone].     Continuous Blood Gluc Transmit (DEXCOM G6 TRANSMITTER) MISC Use to monitor blood sugar.  Replace every 3 months. [place] [phone].     cyclobenzaprine (FLEXERIL) 5 MG tablet Take 1 tablet by mouth 3 (three) times daily as needed.     diltiazem (TIAZAC) 240 MG 24 hr capsule TAKE (1) CAPSULE BY MOUTH EVERY DAY     DULoxetine (CYMBALTA) 60 MG capsule Take 1 capsule (60 mg total) by mouth 2 (two) times daily. 180 capsule 0   EMGALITY 120 MG/ML SOAJ Inject into the skin.     esomeprazole (NEXIUM) 40 MG capsule Take 40 mg by mouth daily.     ezetimibe (ZETIA) 10 MG tablet ezetimibe 10 mg tablet     famotidine (PEPCID) 40 MG tablet Take 40 mg by mouth 2 (two) times daily.     ferrous sulfate 325 (65 FE) MG tablet Take by mouth. Take 325 mg daily with breakfast     fluticasone (FLONASE) 50 MCG/ACT nasal  spray Place 2 sprays into both nostrils as needed.      furosemide (LASIX) 40 MG tablet TAKE (1) TABLET BY MOUTH EVERY DAY     gabapentin (NEURONTIN) 600 MG tablet TAKE (1) TABLET BY MOUTH TWICE DAILY 180 tablet 0   insulin degludec (TRESIBA FLEXTOUCH) 200 UNIT/ML  FlexTouch Pen Inject into the skin.     Insulin Lispro w/ Trans Port 100 UNIT/ML SOPN INJECT UP TO 50 UNITS DAILY DIVIDED DOSES, AS DIRECTED     Insulin Pen Needle (TRUEPLUS PEN NEEDLES) 29G X 12MM MISC      lamoTRIgine (LAMICTAL) 100 MG tablet Take 100 mg by mouth daily.     levothyroxine (SYNTHROID, LEVOTHROID) 100 MCG tablet Take 100 mcg by mouth daily.     losartan (COZAAR) 25 MG tablet Take 12.5 mg by mouth daily.     magnesium oxide (MAG-OX) 400 MG tablet Take 1 tablet by mouth daily.     Melatonin 10 MG TABS Take 2 tablets by mouth at bedtime.     meloxicam (MOBIC) 15 MG tablet Take 15 mg by mouth daily.     metFORMIN (GLUCOPHAGE-XR) 500 MG 24 hr tablet Take 500 mg by mouth 2 (two) times daily.      metoprolol succinate (TOPROL-XL) 25 MG 24 hr tablet Take 1 tablet by mouth daily.     mometasone (ELOCON) 0.1 % cream Apply topically.     mupirocin ointment (BACTROBAN) 2 %      naloxone (NARCAN) nasal spray 4 mg/0.1 mL Place into the nose.     nitroGLYCERIN (NITROSTAT) 0.4 MG SL tablet Place under the tongue.     nystatin (MYCOSTATIN) 100000 UNIT/ML suspension Take 5 mLs by mouth 4 (four) times daily.     omeprazole (PRILOSEC) 20 MG capsule TAKE (1) CAPSULE BY MOUTH EVERY DAY 30 capsule 0   ondansetron (ZOFRAN) 4 MG tablet Take 4 mg by mouth at bedtime.     quinapril (ACCUPRIL) 10 MG tablet Take 10 mg by mouth daily.     Riboflavin 400 MG TABS Take 1 tablet by mouth every night  for headaches     Rimegepant Sulfate (NURTEC) 75 MG TBDP Take 1 tablet by mouth as needed.      Semaglutide 7 MG TABS Take by mouth.     Suvorexant (BELSOMRA) 10 MG TABS Take 10 mg by mouth at bedtime as needed. 30 tablet 0   vitamin B-12 (CYANOCOBALAMIN) 100 MCG tablet Take 1 tablet (100 mcg total) by mouth daily. 90 tablet 1   XTAMPZA ER 9 MG C12A PLEASE SEE ATTACHED FOR DETAILED DIRECTIONS     dabigatran (PRADAXA) 150 MG CAPS capsule Take 150 mg by mouth 2 (two) times daily.     hydrOXYzine (VISTARIL) 25 MG  capsule TAKE 1 CAPSULE BY MOUTH TWICE DAILY AS NEEDED. FOR ANXIETY 180 capsule 0   pioglitazone (ACTOS) 15 MG tablet Take by mouth. Take 1 tablet by mouth once daily     No current facility-administered medications for this visit.     Musculoskeletal: Strength & Muscle Tone: within normal limits Gait & Station: normal Patient leans: N/A  Psychiatric Specialty Exam: Review of Systems  Musculoskeletal:  Positive for back pain.  Psychiatric/Behavioral:  Positive for sleep disturbance.   All other systems reviewed and are negative.   Blood pressure (!) 144/90, pulse 86, temperature 97.9 F (36.6 C), temperature source Temporal, weight 232 lb 6.4 oz (105.4 kg).Body mass index is 37.51 kg/m.  General Appearance: Casual  Eye Contact:  Fair  Speech:  Clear and Coherent  Volume:  Normal  Mood:  Euthymic  Affect:  Congruent  Thought Process:  Goal Directed and Descriptions of Associations: Intact  Orientation:  Full (Time, Place, and Person)  Thought Content: Logical   Suicidal Thoughts:  No  Homicidal Thoughts:  No  Memory:  Immediate;   Fair Recent;   Fair Remote;   Fair  Judgement:  Fair  Insight:  Fair  Psychomotor Activity:  Normal  Concentration:  Concentration: Fair and Attention Span: Fair  Recall:  [employer] of Knowledge: Fair  Language: Fair  Akathisia:  No  Handed:  Right  AIMS (if indicated): done  Assets:  Communication Skills Desire for Improvement Social Support Talents/Skills Transportation  ADL's:  Intact  Cognition: WNL  Sleep:  Poor   Screenings: [place] Office Visit from 12/30/2021 in [place] Office Visit from 11/18/2021 in [place] Video Visit from 09/03/2021 in [place] Total Score 0 0 3      GAD-7    [place] Office Visit from 12/30/2021 in [place] Video Visit from 06/23/2021 in [facility] Office Visit from 05/26/2021 in [facility] Video Visit from 01/22/2021 in [facility] from 01/19/2021 in [facility]  Total GAD-7 Score 1 0 _0 PHQ2-9    [place] Visit from 12/30/2021 in [facility] from 11/18/2021 in [place] Video Visit from 09/03/2021 in [place] Video Visit from 06/23/2021 in [place] Office Visit from 05/26/2021 in [place]  PHQ-2 Total Score 1 2 0 0 1  PHQ-9 Total Score 7 5 -- -- 7      [place] Office Visit from 12/30/2021 in [facility] Office Visit from 11/18/2021 in [place] Video Visit from 09/03/2021 in [place] No Risk No Risk Low Risk        Assessment and Plan: CORTASIA SCREWS is a 56 year old Caucasian female who has a history of MDD, PTSD, chronic pain, migraine headaches, history of CVA, iron deficiency, prothrombin gene mutation was evaluated in office today.  Patient is currently struggling with sleep, although tolerating the lower dosage of Abilify.  Plan MDD in remission Cymbalta 120 mg p.o. daily in divided dosage. Lamotrigine 100 mg p.o. daily-prescribed by neurology. Gabapentin 600 mg p.o. twice daily Reduced Abilify to 1 mg p.o. daily-long-term plan to taper off.  GAD-stable Cymbalta and gabapentin as prescribed  Insomnia-unstable Increase Belsomra to 10 mg p.o. nightly Reviewed [place] PMP aware Will benefit from sufficient pain management.  Bereavement in remission Continue CBT as needed  Follow-up in clinic in 4 weeks or sooner if needed.  This note was generated in part or whole with voice recognition software. Voice  recognition is usually quite accurate but there are transcription errors that can and very often do occur. I apologize for any typographical errors that were not detected and corrected.     Ursula Alert, MD 12/31/2021, 8:45 AM

## 2022-01-20 ENCOUNTER — Inpatient Hospital Stay: Payer: Medicare Other | Attending: Oncology

## 2022-01-20 ENCOUNTER — Encounter: Payer: Self-pay | Admitting: Oncology

## 2022-01-20 ENCOUNTER — Telehealth: Payer: Self-pay

## 2022-01-20 ENCOUNTER — Inpatient Hospital Stay (HOSPITAL_BASED_OUTPATIENT_CLINIC_OR_DEPARTMENT_OTHER): Payer: Medicare Other | Admitting: Oncology

## 2022-01-20 VITALS — BP 126/86 | HR 89 | Temp 96.5°F | Resp 20 | Wt 229.0 lb

## 2022-01-20 DIAGNOSIS — Z7901 Long term (current) use of anticoagulants: Secondary | ICD-10-CM | POA: Diagnosis not present

## 2022-01-20 DIAGNOSIS — E538 Deficiency of other specified B group vitamins: Secondary | ICD-10-CM

## 2022-01-20 DIAGNOSIS — Z8673 Personal history of transient ischemic attack (TIA), and cerebral infarction without residual deficits: Secondary | ICD-10-CM | POA: Diagnosis not present

## 2022-01-20 DIAGNOSIS — D509 Iron deficiency anemia, unspecified: Secondary | ICD-10-CM

## 2022-01-20 DIAGNOSIS — E039 Hypothyroidism, unspecified: Secondary | ICD-10-CM | POA: Diagnosis not present

## 2022-01-20 DIAGNOSIS — R5383 Other fatigue: Secondary | ICD-10-CM | POA: Insufficient documentation

## 2022-01-20 DIAGNOSIS — Z79899 Other long term (current) drug therapy: Secondary | ICD-10-CM | POA: Insufficient documentation

## 2022-01-20 DIAGNOSIS — D6852 Prothrombin gene mutation: Secondary | ICD-10-CM | POA: Diagnosis not present

## 2022-01-20 LAB — CBC WITH DIFFERENTIAL/PLATELET
Abs Immature Granulocytes: 0.04 10*3/uL (ref 0.00–0.07)
Basophils Absolute: 0 10*3/uL (ref 0.0–0.1)
Basophils Relative: 1 %
Eosinophils Absolute: 0.1 10*3/uL (ref 0.0–0.5)
Eosinophils Relative: 2 %
HCT: 43.1 % (ref 36.0–46.0)
Hemoglobin: 14 g/dL (ref 12.0–15.0)
Immature Granulocytes: 1 %
Lymphocytes Relative: 34 %
Lymphs Abs: 2.1 10*3/uL (ref 0.7–4.0)
MCH: 29 pg (ref 26.0–34.0)
MCHC: 32.5 g/dL (ref 30.0–36.0)
MCV: 89.2 fL (ref 80.0–100.0)
Monocytes Absolute: 0.4 10*3/uL (ref 0.1–1.0)
Monocytes Relative: 7 %
Neutro Abs: 3.4 10*3/uL (ref 1.7–7.7)
Neutrophils Relative %: 55 %
Platelets: 214 10*3/uL (ref 150–400)
RBC: 4.83 MIL/uL (ref 3.87–5.11)
RDW: 13 % (ref 11.5–15.5)
WBC: 6.1 10*3/uL (ref 4.0–10.5)
nRBC: 0 % (ref 0.0–0.2)

## 2022-01-20 LAB — VITAMIN B12: Vitamin B-12: 357 pg/mL (ref 180–914)

## 2022-01-20 LAB — IRON AND TIBC
Iron: 81 ug/dL (ref 28–170)
Saturation Ratios: 16 % (ref 10.4–31.8)
TIBC: 508 ug/dL — ABNORMAL HIGH (ref 250–450)
UIBC: 427 ug/dL

## 2022-01-20 LAB — FERRITIN: Ferritin: 35 ng/mL (ref 11–307)

## 2022-01-20 NOTE — Telephone Encounter (Signed)
Pt is aware that her iron levels are borderline low but not anemic. Pt states she will like to have IV iron. Informed pt once appts are made they will appear on MyChart. Pt understands and agrees with plan.

## 2022-01-20 NOTE — Progress Notes (Signed)
Hematology/Oncology Consult note [facility]  Telephone:([phone] [phone]  Patient Care Team: Doughton, Latricia Heft, MD as PCP - General (Student) Lucilla Lame, MD as Consulting Physician (Gastroenterology)   Name of the patient: Christine Mcneil  [id number]  11/23/1965   Date of visit: 01/20/22  Diagnosis-iron deficiency anemia  Chief complaint/ Reason for visit-routine follow-up of iron deficiency anemia  Heme/Onc history:  Patient is a 56 year old female is a transfer of care from Dr. Mike Gip for history of iron deficiency anemia which has been followed since 2019.EGD on 03/21/2018 was normal.  Colonoscopy on 03/21/2018 revealed for 1 to 2 mm polyps in the sigmoid colon and internal hemorrhoids.  Pathology revealed hyperplastic polyps.  Patient also had capsule study in May 2021 which was inconclusive as it did not enter the small bowel.   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  She has a prothrombin gene mutation.  She is on long term anticoagulation with Pradaxa.   Interval history-patient currently reports doing well and other than occasional fatigue she denies other complaints at this time  ECOG PS- 1 Pain scale- 0   Review of systems- Review of Systems  Constitutional:  Positive for malaise/fatigue. Negative for chills, fever and weight loss.  HENT:  Negative for congestion, ear discharge and nosebleeds.   Eyes:  Negative for blurred vision.  Respiratory:  Negative for cough, hemoptysis, sputum production, shortness of breath and wheezing.   Cardiovascular:  Negative for chest pain, palpitations, orthopnea and claudication.  Gastrointestinal:  Negative for abdominal pain, blood in stool, constipation, diarrhea, heartburn, melena, nausea and vomiting.  Genitourinary:  Negative for dysuria, flank pain, frequency, hematuria and urgency.  Musculoskeletal:  Negative for back pain, joint pain and myalgias.  Skin:  Negative  for rash.  Neurological:  Negative for dizziness, tingling, focal weakness, seizures, weakness and headaches.  Endo/Heme/Allergies:  Does not bruise/bleed easily.  Psychiatric/Behavioral:  Negative for depression and suicidal ideas. The patient does not have insomnia.       Allergies  Allergen Reactions   Amoxapine Itching   Amoxicillin Itching   Dapagliflozin Itching    Other reaction(s): Other (See Comments) Thrush & vaginal itching   Keflex [Cephalexin] Itching   Mirtazapine Other (See Comments)    Pt states felling "like my body is outside of me."     Past Medical History:  Diagnosis Date   Abdominal pain    Anxiety and depression    Atypical facial pain    Bilateral occipital neuralgia    Cervical spondylosis without myelopathy    Chronic daily headache    Chronic pain    Chronic pelvic pain in female    Chronic thoracic back pain    Depression    Diabetes mellitus without complication (HCC)    Diabetes mellitus, type II (HCC)    Dysphagia    Dysrhythmia    Fibromyalgia    Hyperlipidemia    Hypertension    Hypothyroidism    Insomnia    Iron deficiency anemia    Left hip pain    Low back pain    Migraines    Numbness    Optic neuropathy    OSA (obstructive sleep apnea)    Polyarthralgia    Primary osteoarthritis of both hips    Prothrombin gene mutation ([place])    Pseudotumor cerebri    Rectal bleeding    Right shoulder pain    Rotator cuff syndrome    Stroke (  [place])    Thyroid disease    TIA (transient ischemic attack) 05/27/2017     Past Surgical History:  Procedure Laterality Date   ABDOMINAL HYSTERECTOMY     total   CHOLECYSTECTOMY     COLONOSCOPY WITH PROPOFOL N/A 03/21/2018   Procedure: COLONOSCOPY WITH PROPOFOL;  Surgeon: Lucilla Lame, MD;  Location: [facility] ENDOSCOPY;  Service: Endoscopy;  Laterality: N/A;   ERCP N/A 08/19/2020   Procedure: ENDOSCOPIC RETROGRADE CHOLANGIOPANCREATOGRAPHY (ERCP);  Surgeon: Lucilla Lame, MD;  Location: [facility]  ENDOSCOPY;  Service: Endoscopy;  Laterality: N/A;   ESOPHAGOGASTRODUODENOSCOPY (EGD) WITH PROPOFOL N/A 03/21/2018   Procedure: ESOPHAGOGASTRODUODENOSCOPY (EGD) WITH PROPOFOL;  Surgeon: Lucilla Lame, MD;  Location: [facility] ENDOSCOPY;  Service: Endoscopy;  Laterality: N/A;   GIVENS CAPSULE STUDY N/A 09/24/2019   Procedure: GIVENS CAPSULE STUDY;  Surgeon: Virgel Manifold, MD;  Location: [facility] ENDOSCOPY;  Service: Endoscopy;  Laterality: N/A;   JOINT REPLACEMENT     KNEE SURGERY Left    ROTATOR CUFF REPAIR Right    spinal neurostimulator      Social History   Socioeconomic History   Marital status: Divorced    Spouse name: Not on file   Number of children: 1   Years of education: Not on file   Highest education level: High school graduate  Occupational History    Comment: disablitiy  Tobacco Use   Smoking status: Never   Smokeless tobacco: Never  Vaping Use   Vaping Use: Never used  Substance and Sexual Activity   Alcohol use: No   Drug use: No   Sexual activity: Not Currently  Other Topics Concern   Not on file  Social History Narrative   Not on file   Social Determinants of Health   Financial Resource Strain: Low Risk  (07/19/2017)   Overall Financial Resource Strain (CARDIA)    Difficulty of Paying Living Expenses: Not hard at all  Food Insecurity: No Food Insecurity (07/19/2017)   Hunger Vital Sign    Worried About Running Out of Food in the Last Year: Never true    [place] in the Last Year: Never true  Transportation Needs: No Transportation Needs (07/19/2017)   PRAPARE - Hydrologist (Medical): No    Lack of Transportation (Non-Medical): No  Physical Activity: Inactive (07/19/2017)   Exercise Vital Sign    Days of Exercise per Week: 0 days    Minutes of Exercise per Session: 0 min  Stress: Stress Concern Present (07/19/2017)   [place]    Feeling of Stress : Very  much  Social Connections: Moderately Isolated (07/19/2017)   Social Connection and Isolation Panel [NHANES]    Frequency of Communication with Friends and Family: Once a week    Frequency of Social Gatherings with Friends and Family: Never    Attends Religious Services: More than 4 times per year    Active Member of [employer] or Organizations: No    Attends Archivist Meetings: Never    Marital Status: Divorced  Human resources officer Violence: Not At Risk (07/19/2017)   Humiliation, Afraid, Rape, and Kick questionnaire    Fear of Current or Ex-Partner: No    Emotionally Abused: No    Physically Abused: No    Sexually Abused: No    Family History  Problem Relation Age of Onset   Diabetes Mother    Hyperlipidemia Mother    Hypertension Mother    COPD Mother  CVA Mother    Anemia Mother    Diabetes Father    Hyperlipidemia Father    Hypertension Father    Thyroid disease Father    Alcohol abuse Father    Peripheral vascular disease Sister    Hypertension Sister    Anxiety disorder Son    Depression Son    Bipolar disorder Son    Seizures Son      Current Outpatient Medications:    ACCU-CHEK AVIVA PLUS test strip, , Disp: , Rfl:    ACCU-CHEK SOFTCLIX LANCETS lancets, , Disp: , Rfl:    Acetaminophen (TYLENOL ARTHRITIS EXT RELIEF PO), Take 600 mg by mouth daily., Disp: , Rfl:    acetaZOLAMIDE (DIAMOX) 250 MG tablet, Take by mouth., Disp: , Rfl:    albuterol (VENTOLIN HFA) 108 (90 Base) MCG/ACT inhaler, Inhale 2 puffs into the lungs every 6 (six) hours as needed for wheezing or shortness of breath., Disp: 1 g, Rfl: 0   ARIPiprazole (ABILIFY) 2 MG tablet, Take 0.5 tablets (1 mg total) by mouth daily., Disp: 45 tablet, Rfl: 0   aspirin-acetaminophen-caffeine (EXCEDRIN MIGRAINE) 250-250-65 MG tablet, Take 2 tablets by mouth every 8 (eight) hours as needed for headache or migraine. , Disp: , Rfl:    atorvastatin (LIPITOR) 80 MG tablet, Take 80 mg by mouth daily at 6 PM., Disp: ,  Rfl:    Blood Glucose Monitoring Suppl (ACCU-CHEK AVIVA PLUS) w/Device KIT, , Disp: , Rfl:    Carboxymethylcellulose Sod PF 0.5 % SOLN, Apply 1 drop to eye daily as needed., Disp: , Rfl:    Cholecalciferol (VITAMIN D3) 1000 units CAPS, Take 1 capsule by mouth daily., Disp: , Rfl:    clobetasol ointment (TEMOVATE) 0.05 %, Apply topically., Disp: , Rfl:    clotrimazole (LOTRIMIN) 1 % cream, Apply 1 application topically daily as needed., Disp: , Rfl:    Continuous Blood Gluc Receiver ([place]) DEVI, Use to monitor blood sugar. [place] [phone], Disp: , Rfl:    Continuous Blood Gluc Sensor (DEXCOM G6 SENSOR) MISC, Use to monitor blood sugar.  Replace every 10 days. [place] [id number]., Disp: , Rfl:    Continuous Blood Gluc Transmit (DEXCOM G6 TRANSMITTER) MISC, Use to monitor blood sugar.  Replace every 3 months. [facility] [phone]., Disp: , Rfl:    cyclobenzaprine (FLEXERIL) 5 MG tablet, Take 1 tablet by mouth 3 (three) times daily as needed., Disp: , Rfl:    dabigatran (PRADAXA) 150 MG CAPS capsule, Take 150 mg by mouth 2 (two) times daily., Disp: , Rfl:    diltiazem (TIAZAC) 240 MG 24 hr capsule, TAKE (1) CAPSULE BY MOUTH EVERY DAY, Disp: , Rfl:    DULoxetine (CYMBALTA) 60 MG capsule, Take 1 capsule (60 mg total) by mouth 2 (two) times daily., Disp: 180 capsule, Rfl: 0   EMGALITY 120 MG/ML SOAJ, Inject into the skin., Disp: , Rfl:    esomeprazole (NEXIUM) 40 MG capsule, Take 40 mg by mouth daily., Disp: , Rfl:    ezetimibe (ZETIA) 10 MG tablet, ezetimibe 10 mg tablet, Disp: , Rfl:    famotidine (PEPCID) 40 MG tablet, Take 40 mg by mouth 2 (two) times daily., Disp: , Rfl:    ferrous sulfate 325 (65 FE) MG tablet, Take by mouth. Take 325 mg daily with breakfast, Disp: , Rfl:    fluticasone (FLONASE) 50 MCG/ACT nasal spray, Place 2 sprays into both nostrils as needed. , Disp: , Rfl:    furosemide (LASIX) 40 MG tablet, TAKE (1) TABLET BY  MOUTH EVERY DAY, Disp: , Rfl:    gabapentin  (NEURONTIN) 600 MG tablet, TAKE (1) TABLET BY MOUTH TWICE DAILY, Disp: 180 tablet, Rfl: 0   hydrOXYzine (VISTARIL) 25 MG capsule, TAKE 1 CAPSULE BY MOUTH TWICE DAILY AS NEEDED. FOR ANXIETY, Disp: 180 capsule, Rfl: 0   insulin degludec (TRESIBA FLEXTOUCH) 200 UNIT/ML FlexTouch Pen, Inject into the skin., Disp: , Rfl:    Insulin Lispro w/ Trans Port 100 UNIT/ML SOPN, INJECT UP TO 50 UNITS DAILY DIVIDED DOSES, AS DIRECTED, Disp: , Rfl:    Insulin Pen Needle (TRUEPLUS PEN NEEDLES) 29G X 12MM MISC, , Disp: , Rfl:    lamoTRIgine (LAMICTAL) 100 MG tablet, Take 100 mg by mouth daily., Disp: , Rfl:    levothyroxine (SYNTHROID, LEVOTHROID) 100 MCG tablet, Take 100 mcg by mouth daily., Disp: , Rfl:    losartan (COZAAR) 25 MG tablet, Take 12.5 mg by mouth daily., Disp: , Rfl:    magnesium oxide (MAG-OX) 400 MG tablet, Take 1 tablet by mouth daily., Disp: , Rfl:    Melatonin 10 MG TABS, Take 2 tablets by mouth at bedtime., Disp: , Rfl:    meloxicam (MOBIC) 15 MG tablet, Take 15 mg by mouth daily., Disp: , Rfl:    metFORMIN (GLUCOPHAGE-XR) 500 MG 24 hr tablet, Take 500 mg by mouth 2 (two) times daily. , Disp: , Rfl:    metoprolol succinate (TOPROL-XL) 25 MG 24 hr tablet, Take 1 tablet by mouth daily., Disp: , Rfl:    mometasone (ELOCON) 0.1 % cream, Apply topically., Disp: , Rfl:    mupirocin ointment (BACTROBAN) 2 %, , Disp: , Rfl:    nystatin (MYCOSTATIN) 100000 UNIT/ML suspension, Take 5 mLs by mouth 4 (four) times daily., Disp: , Rfl:    omeprazole (PRILOSEC) 20 MG capsule, TAKE (1) CAPSULE BY MOUTH EVERY DAY, Disp: 30 capsule, Rfl: 0   ondansetron (ZOFRAN) 4 MG tablet, Take 4 mg by mouth at bedtime., Disp: , Rfl:    pioglitazone (ACTOS) 15 MG tablet, Take by mouth. Take 1 tablet by mouth once daily, Disp: , Rfl:    quinapril (ACCUPRIL) 10 MG tablet, Take 10 mg by mouth daily., Disp: , Rfl:    Riboflavin 400 MG TABS, Take 1 tablet by mouth every night  for headaches, Disp: , Rfl:    Rimegepant Sulfate  (NURTEC) 75 MG TBDP, Take 1 tablet by mouth as needed. , Disp: , Rfl:    Semaglutide 7 MG TABS, Take by mouth., Disp: , Rfl:    Suvorexant (BELSOMRA) 10 MG TABS, Take 10 mg by mouth at bedtime as needed., Disp: 30 tablet, Rfl: 0   vitamin B-12 (CYANOCOBALAMIN) 100 MCG tablet, Take 1 tablet (100 mcg total) by mouth daily., Disp: 90 tablet, Rfl: 1   XTAMPZA ER 9 MG C12A, PLEASE SEE ATTACHED FOR DETAILED DIRECTIONS, Disp: , Rfl:    naloxone (NARCAN) nasal spray 4 mg/0.1 mL, Place into the nose. (Patient not taking: Reported on 01/20/2022), Disp: , Rfl:    nitroGLYCERIN (NITROSTAT) 0.4 MG SL tablet, Place under the tongue. (Patient not taking: Reported on 01/20/2022), Disp: , Rfl:   Physical exam:  Vitals:   01/20/22 1029  BP: 126/86  Pulse: 89  Resp: 20  Temp: (!) 96.5 F (35.8 C)  SpO2: 97%  Weight: 229 lb (103.9 kg)   Physical Exam Constitutional:      General: She is not in acute distress. Cardiovascular:     Rate and Rhythm: Normal rate and regular rhythm.  Heart sounds: Normal heart sounds.  Pulmonary:     Effort: Pulmonary effort is normal.     Breath sounds: Normal breath sounds.  Abdominal:     General: Bowel sounds are normal.     Palpations: Abdomen is soft.  Skin:    General: Skin is warm and dry.  Neurological:     Mental Status: She is alert and oriented to person, place, and time.         Latest Ref Rng & Units 10/21/2020   11:10 AM  CMP  Glucose 65 - 99 mg/dL 233   BUN 6 - 24 mg/dL 7   Creatinine 0.57 - 1.00 mg/dL 0.99   Sodium 134 - 144 mmol/L 137   Potassium 3.5 - 5.2 mmol/L 4.1   Chloride 96 - 106 mmol/L 97   CO2 20 - 29 mmol/L 21   Calcium 8.7 - 10.2 mg/dL 8.9   Total Protein 6.0 - 8.5 g/dL 7.3   Total Bilirubin 0.0 - 1.2 mg/dL 0.3   Alkaline Phos 44 - 121 IU/L 119   AST 0 - 40 IU/L 31   ALT 0 - 32 IU/L 34       Latest Ref Rng & Units 01/20/2022    9:52 AM  CBC  WBC 4.0 - 10.5 K/uL 6.1   Hemoglobin 12.0 - 15.0 g/dL 14.0   Hematocrit 36.0 -  46.0 % 43.1   Platelets 150 - 400 K/uL 214     Assessment and plan- Patient is a 56 y.o. female with history of iron deficiency Anemia here for acute in follow-up  Patient is not presently anemic with an H&H of 14/43.1.  Iron studies show ferritin level of 35.  TIBC is mildly elevated at 508 along with an iron saturation of 16%.  We will reach out to her if she wishes to get IV iron at this time given that her ferritin is somewhat low with an iron saturation of less than 20%.  We will continue to monitor her iron studies every 4 months and I will see her back in 8 months   Visit Diagnosis 1. B12 deficiency   2. Iron deficiency anemia, unspecified iron deficiency anemia type      Dr. Randa Evens, MD, MPH [facility] at [facility] [phone] 01/20/2022 3:37 PM

## 2022-01-21 ENCOUNTER — Telehealth: Payer: Self-pay | Admitting: Oncology

## 2022-01-21 NOTE — Telephone Encounter (Signed)
Attempt made to reach patient to schedule her for Venofer x 5--no answer and the mailbox is full.

## 2022-01-27 ENCOUNTER — Other Ambulatory Visit: Payer: Self-pay | Admitting: Neurology

## 2022-01-27 DIAGNOSIS — G932 Benign intracranial hypertension: Secondary | ICD-10-CM

## 2022-01-27 DIAGNOSIS — G43009 Migraine without aura, not intractable, without status migrainosus: Secondary | ICD-10-CM

## 2022-01-28 ENCOUNTER — Other Ambulatory Visit (HOSPITAL_COMMUNITY): Payer: Self-pay | Admitting: Internal Medicine

## 2022-01-28 DIAGNOSIS — R079 Chest pain, unspecified: Secondary | ICD-10-CM

## 2022-02-01 ENCOUNTER — Other Ambulatory Visit: Payer: Self-pay | Admitting: Psychiatry

## 2022-02-01 DIAGNOSIS — F3342 Major depressive disorder, recurrent, in full remission: Secondary | ICD-10-CM

## 2022-02-02 ENCOUNTER — Inpatient Hospital Stay: Payer: Medicare Other | Attending: Oncology

## 2022-02-02 ENCOUNTER — Other Ambulatory Visit: Payer: Self-pay | Admitting: Oncology

## 2022-02-02 VITALS — BP 110/85 | HR 89 | Temp 97.2°F | Resp 18

## 2022-02-02 DIAGNOSIS — D509 Iron deficiency anemia, unspecified: Secondary | ICD-10-CM | POA: Insufficient documentation

## 2022-02-02 MED ORDER — SODIUM CHLORIDE 0.9 % IV SOLN
Freq: Once | INTRAVENOUS | Status: AC
  Filled 2022-02-02: qty 250

## 2022-02-02 MED ORDER — SODIUM CHLORIDE 0.9 % IV SOLN
200.0000 mg | INTRAVENOUS | Status: DC
  Administered 2022-02-02: 200 mg via INTRAVENOUS
  Filled 2022-02-02: qty 200

## 2022-02-04 ENCOUNTER — Encounter (HOSPITAL_COMMUNITY): Payer: Self-pay

## 2022-02-04 ENCOUNTER — Telehealth (INDEPENDENT_AMBULATORY_CARE_PROVIDER_SITE_OTHER): Payer: Medicare Other | Admitting: Psychiatry

## 2022-02-04 ENCOUNTER — Encounter: Payer: Self-pay | Admitting: Psychiatry

## 2022-02-04 ENCOUNTER — Telehealth (HOSPITAL_COMMUNITY): Payer: Self-pay | Admitting: *Deleted

## 2022-02-04 ENCOUNTER — Other Ambulatory Visit (HOSPITAL_COMMUNITY): Payer: Self-pay | Admitting: *Deleted

## 2022-02-04 DIAGNOSIS — F3342 Major depressive disorder, recurrent, in full remission: Secondary | ICD-10-CM

## 2022-02-04 DIAGNOSIS — F411 Generalized anxiety disorder: Secondary | ICD-10-CM

## 2022-02-04 DIAGNOSIS — F4310 Post-traumatic stress disorder, unspecified: Secondary | ICD-10-CM

## 2022-02-04 DIAGNOSIS — G4701 Insomnia due to medical condition: Secondary | ICD-10-CM | POA: Diagnosis not present

## 2022-02-04 DIAGNOSIS — Z634 Disappearance and death of family member: Secondary | ICD-10-CM

## 2022-02-04 MED ORDER — DULOXETINE HCL 60 MG PO CPEP: 60.0000 mg | ORAL_CAPSULE | Freq: Two times a day (BID) | ORAL | 0 refills | Status: DC

## 2022-02-04 MED ORDER — ARIPIPRAZOLE 2 MG PO TABS: 1.0000 mg | ORAL_TABLET | Freq: Every day | ORAL | 0 refills | Status: DC

## 2022-02-04 MED ORDER — METOPROLOL TARTRATE 100 MG PO TABS: ORAL_TABLET | ORAL | 0 refills | Status: DC

## 2022-02-04 MED ORDER — BELSOMRA 15 MG PO TABS: 15.0000 mg | ORAL_TABLET | Freq: Every day | ORAL | 1 refills | Status: DC

## 2022-02-04 NOTE — Telephone Encounter (Signed)
Attempted to call patient regarding upcoming cardiac CT appointment. °Left message on voicemail with name and callback number ° °Amay Mijangos RN Navigator Cardiac Imaging °[facility] °[phone] Office °[phone] Cell ° °

## 2022-02-04 NOTE — Progress Notes (Unsigned)
Virtual Visit via Video Note  I connected with Christine Mcneil on 02/04/22 at  2:00 PM EDT by a video enabled telemedicine application and verified that I am speaking with the correct person using two identifiers.  Location Provider Location : [facility] Patient Location : Home  Participants: Patient ,Boy riend Provider    I discussed the limitations of evaluation and management by telemedicine and the availability of in person appointments. The patient expressed understanding and agreed to proceed.   I discussed the assessment and treatment plan with the patient. The patient was provided an opportunity to ask questions and all were answered. The patient agreed with the plan and demonstrated an understanding of the instructions.   The patient was advised to call back or seek an in-person evaluation if the symptoms worsen or if the condition fails to improve as anticipated.   [facility] MD OP Progress Note  02/05/2022 8:21 AM Christine Mcneil  MRN:  [id number]  Chief Complaint:  Chief Complaint  Patient presents with   Follow-up: 56 year old Caucasian female with history of depression, anxiety, currently struggling with significant headaches, and mood lability and sleep problems.   HPI: Christine Mcneil is a 56 year old Caucasian female, divorced, lives in [place], has a history of MDD, PTSD, insomnia, OSA, history of CVA, history of prothrombin gene mutation, chronic pain, migraine headaches, vitamin B12 deficiency, iron deficiency was evaluated by telemedicine today.  Patient today reports she is currently struggling with significant migraine headaches, currently not on Emgality.  She does take Tylenol however that does not seem to help at all. Scheduled for lumbar puncture on the 25th.  Patient reports that headaches are so severe, currently rates it at a 10 out of 10, it does have an impact on her sleep as well as mood.  Since sleep is affected she has been waking up feeling irritable and anxious.  Patient  otherwise compliant on her medications.  Currently on a lower dosage of Abilify.  Denies any abnormal involuntary movements or muscle spasms.  Patient denies any suicidality, homicidality or perceptual disturbances.  Patient agreeable to dosage increase of Belsomra.  Denies any other concerns today.  Visit Diagnosis:    ICD-10-CM   1. MDD (major depressive disorder), recurrent, in full remission ([place])  F33.42 ARIPiprazole (ABILIFY) 2 MG tablet    2. PTSD (post-traumatic stress disorder)  F43.10     3. GAD (generalized anxiety disorder)  F41.1     4. Insomnia due to medical condition  G47.01 Suvorexant (BELSOMRA) 15 MG TABS   Headaches    5. Bereavement  Z63.4 DULoxetine (CYMBALTA) 60 MG capsule      Past Psychiatric History: Reviewed past psychiatric history from progress note on 08/16/2017.  Past trials of Effexor, Klonopin.  Past Medical History:  Past Medical History:  Diagnosis Date   Abdominal pain    Anxiety and depression    Atypical facial pain    Bilateral occipital neuralgia    Cervical spondylosis without myelopathy    Chronic daily headache    Chronic pain    Chronic pelvic pain in female    Chronic thoracic back pain    Depression    Diabetes mellitus without complication (HCC)    Diabetes mellitus, type II (HCC)    Dysphagia    Dysrhythmia    Fibromyalgia    Hyperlipidemia    Hypertension    Hypothyroidism    Insomnia    Iron deficiency anemia    Left hip pain  Low back pain    Migraines    Numbness    Optic neuropathy    OSA (obstructive sleep apnea)    Polyarthralgia    Primary osteoarthritis of both hips    Prothrombin gene mutation (HCC)    Pseudotumor cerebri    Rectal bleeding    Right shoulder pain    Rotator cuff syndrome    Stroke [facility])    Thyroid disease    TIA (transient ischemic attack) 05/27/2017    Past Surgical History:  Procedure Laterality Date   ABDOMINAL HYSTERECTOMY     total   CHOLECYSTECTOMY     COLONOSCOPY  WITH PROPOFOL N/A 03/21/2018   Procedure: COLONOSCOPY WITH PROPOFOL;  Surgeon: Lucilla Lame, MD;  Location: [facility] ENDOSCOPY;  Service: Endoscopy;  Laterality: N/A;   ERCP N/A 08/19/2020   Procedure: ENDOSCOPIC RETROGRADE CHOLANGIOPANCREATOGRAPHY (ERCP);  Surgeon: Lucilla Lame, MD;  Location: [facility] ENDOSCOPY;  Service: Endoscopy;  Laterality: N/A;   ESOPHAGOGASTRODUODENOSCOPY (EGD) WITH PROPOFOL N/A 03/21/2018   Procedure: ESOPHAGOGASTRODUODENOSCOPY (EGD) WITH PROPOFOL;  Surgeon: Lucilla Lame, MD;  Location: [facility] ENDOSCOPY;  Service: Endoscopy;  Laterality: N/A;   GIVENS CAPSULE STUDY N/A 09/24/2019   Procedure: GIVENS CAPSULE STUDY;  Surgeon: Virgel Manifold, MD;  Location: [facility] ENDOSCOPY;  Service: Endoscopy;  Laterality: N/A;   JOINT REPLACEMENT     KNEE SURGERY Left    ROTATOR CUFF REPAIR Right    spinal neurostimulator      Family Psychiatric History: Reviewed family psychiatric history from progress note on 08/16/2017.  Family History:  Family History  Problem Relation Age of Onset   Diabetes Mother    Hyperlipidemia Mother    Hypertension Mother    COPD Mother    CVA Mother    Anemia Mother    Diabetes Father    Hyperlipidemia Father    Hypertension Father    Thyroid disease Father    Alcohol abuse Father    Peripheral vascular disease Sister    Hypertension Sister    Anxiety disorder Son    Depression Son    Bipolar disorder Son    Seizures Son     Social History: Reviewed social history from progress note on 08/16/2017. Social History   Socioeconomic History   Marital status: Divorced    Spouse name: Not on file   Number of children: 1   Years of education: Not on file   Highest education level: High school graduate  Occupational History    Comment: disablitiy  Tobacco Use   Smoking status: Never   Smokeless tobacco: Never  Vaping Use   Vaping Use: Never used  Substance and Sexual Activity   Alcohol use: No   Drug use: No   Sexual activity: Not Currently   Other Topics Concern   Not on file  Social History Narrative   Not on file   Social Determinants of Health   Financial Resource Strain: Low Risk  (07/19/2017)   Overall Financial Resource Strain (CARDIA)    Difficulty of Paying Living Expenses: Not hard at all  Food Insecurity: No Food Insecurity (07/19/2017)   Hunger Vital Sign    Worried About Running Out of Food in the Last Year: Never true    Center in the Last Year: Never true  Transportation Needs: No Transportation Needs (07/19/2017)   PRAPARE - Hydrologist (Medical): No    Lack of Transportation (Non-Medical): No  Physical Activity: Inactive (07/19/2017)   Exercise Vital Sign  Days of Exercise per Week: 0 days    Minutes of Exercise per Session: 0 min  Stress: Stress Concern Present (07/19/2017)   [place]    Feeling of Stress : Very much  Social Connections: Moderately Isolated (07/19/2017)   Social Connection and Isolation Panel [NHANES]    Frequency of Communication with Friends and Family: Once a week    Frequency of Social Gatherings with Friends and Family: Never    Attends Religious Services: More than 4 times per year    Active Member of [employer] or Organizations: No    Attends Archivist Meetings: Never    Marital Status: Divorced    Allergies:  Allergies  Allergen Reactions   Amoxapine Itching   Amoxicillin Itching   Dapagliflozin Itching    Other reaction(s): Other (See Comments) Thrush & vaginal itching   Keflex [Cephalexin] Itching   Mirtazapine Other (See Comments)    Pt states felling "like my body is outside of me."    Metabolic Disorder Labs: Lab Results  Component Value Date   HGBA1C 9.9 (H) 06/21/2017   MPG 237.43 06/21/2017   Lab Results  Component Value Date   PROLACTIN 4.3 (L) 05/26/2021   PROLACTIN 5.5 09/06/2017   Lab Results  Component Value Date   CHOL 157  05/26/2021   TRIG 245 (H) 05/26/2021   HDL 32 (L) 05/26/2021   CHOLHDL 4.9 (H) 05/26/2021   VLDL 39 06/21/2017   LDLCALC 84 05/26/2021   LDLCALC 174 (H) 06/21/2017   Lab Results  Component Value Date   TSH 2.890 09/06/2017   TSH 3.176 01/04/2017    Therapeutic Level Labs: No results found for: "LITHIUM" No results found for: "VALPROATE" No results found for: "CBMZ"  Current Medications: Current Outpatient Medications  Medication Sig Dispense Refill   ACCU-CHEK AVIVA PLUS test strip      ACCU-CHEK SOFTCLIX LANCETS lancets      Acetaminophen (TYLENOL ARTHRITIS EXT RELIEF PO) Take 600 mg by mouth daily.     acetaZOLAMIDE (DIAMOX) 250 MG tablet Take by mouth.     albuterol (VENTOLIN HFA) 108 (90 Base) MCG/ACT inhaler Inhale 2 puffs into the lungs every 6 (six) hours as needed for wheezing or shortness of breath. 1 g 0   aspirin-acetaminophen-caffeine (EXCEDRIN MIGRAINE) 098-119-14 MG tablet Take 2 tablets by mouth every 8 (eight) hours as needed for headache or migraine.      atorvastatin (LIPITOR) 80 MG tablet Take 80 mg by mouth daily at 6 PM.     Blood Glucose Monitoring Suppl (ACCU-CHEK AVIVA PLUS) w/Device KIT      Carboxymethylcellulose Sod PF 0.5 % SOLN Apply 1 drop to eye daily as needed.     Cholecalciferol (VITAMIN D3) 1000 units CAPS Take 1 capsule by mouth daily.     clobetasol ointment (TEMOVATE) 0.05 % Apply topically.     clotrimazole (LOTRIMIN) 1 % cream Apply 1 application topically daily as needed.     Continuous Blood Gluc Receiver (DEXCOM G6 RECEIVER) DEVI Use to monitor blood sugar. [place] [phone]     Continuous Blood Gluc Sensor (DEXCOM G6 SENSOR) MISC Use to monitor blood sugar.  Replace every 10 days. [place] [phone].     Continuous Blood Gluc Transmit (DEXCOM G6 TRANSMITTER) MISC Use to monitor blood sugar.  Replace every 3 months. [place] [phone].     cyclobenzaprine (FLEXERIL) 5 MG tablet Take 1 tablet by mouth 3 (three) times daily as needed.  diltiazem (TIAZAC) 240 MG 24 hr capsule TAKE (1) CAPSULE BY MOUTH EVERY DAY     esomeprazole (NEXIUM) 40 MG capsule Take 40 mg by mouth daily.     ezetimibe (ZETIA) 10 MG tablet ezetimibe 10 mg tablet     famotidine (PEPCID) 40 MG tablet Take 40 mg by mouth 2 (two) times daily.     ferrous sulfate 325 (65 FE) MG tablet Take by mouth. Take 325 mg daily with breakfast     fluticasone (FLONASE) 50 MCG/ACT nasal spray Place 2 sprays into both nostrils as needed.      furosemide (LASIX) 40 MG tablet TAKE (1) TABLET BY MOUTH EVERY DAY     gabapentin (NEURONTIN) 600 MG tablet TAKE (1) TABLET BY MOUTH TWICE DAILY 180 tablet 0   hydrOXYzine (VISTARIL) 25 MG capsule TAKE 1 CAPSULE BY MOUTH TWICE DAILY AS NEEDED. FOR ANXIETY 180 capsule 0   insulin degludec (TRESIBA FLEXTOUCH) 200 UNIT/ML FlexTouch Pen Inject into the skin.     Insulin Lispro w/ Trans Port 100 UNIT/ML SOPN INJECT UP TO 50 UNITS DAILY DIVIDED DOSES, AS DIRECTED     Insulin Pen Needle (TRUEPLUS PEN NEEDLES) 29G X 12MM MISC      lamoTRIgine (LAMICTAL) 100 MG tablet Take 100 mg by mouth daily.     levothyroxine (SYNTHROID, LEVOTHROID) 100 MCG tablet Take 100 mcg by mouth daily.     magnesium oxide (MAG-OX) 400 MG tablet Take 1 tablet by mouth daily.     Melatonin 10 MG TABS Take 2 tablets by mouth at bedtime.     meloxicam (MOBIC) 15 MG tablet Take 15 mg by mouth daily.     metFORMIN (GLUCOPHAGE-XR) 500 MG 24 hr tablet Take 500 mg by mouth 2 (two) times daily.      metoprolol succinate (TOPROL-XL) 25 MG 24 hr tablet Take 1 tablet by mouth daily.     metoprolol tartrate (LOPRESSOR) 100 MG tablet Take tablet (190m) TWO hours prior to your cardiac CT scan. Do NOT take your regular metoprolol. 1 tablet 0   mometasone (ELOCON) 0.1 % cream Apply topically.     naloxone (NARCAN) nasal spray 4 mg/0.1 mL Place into the nose.     nystatin (MYCOSTATIN) 100000 UNIT/ML suspension Take 5 mLs by mouth 4 (four) times daily.     omeprazole (PRILOSEC) 20 MG  capsule TAKE (1) CAPSULE BY MOUTH EVERY DAY 30 capsule 0   ondansetron (ZOFRAN) 4 MG tablet Take by mouth.     Riboflavin 400 MG TABS Take 1 tablet by mouth every night  for headaches     Suvorexant (BELSOMRA) 15 MG TABS Take 15 mg by mouth at bedtime. 30 tablet 1   vitamin B-12 (CYANOCOBALAMIN) 100 MCG tablet Take 1 tablet (100 mcg total) by mouth daily. 90 tablet 1   XTAMPZA ER 9 MG C12A PLEASE SEE ATTACHED FOR DETAILED DIRECTIONS     ARIPiprazole (ABILIFY) 2 MG tablet Take 0.5 tablets (1 mg total) by mouth daily. 45 tablet 0   dabigatran (PRADAXA) 150 MG CAPS capsule Take 150 mg by mouth 2 (two) times daily.     DULoxetine (CYMBALTA) 60 MG capsule Take 1 capsule (60 mg total) by mouth 2 (two) times daily. 180 capsule 0   nitroGLYCERIN (NITROSTAT) 0.4 MG SL tablet Place under the tongue. (Patient not taking: Reported on 01/20/2022)     [START ON 02/14/2022] oxyCODONE ER 9 MG C12A Take by mouth. (Patient not taking: Reported on 02/04/2022)     pioglitazone (  ACTOS) 15 MG tablet Take by mouth. Take 1 tablet by mouth once daily     Ubrogepant 50 MG TABS Take 50 mg tablet by mouth at onset of migraine (Patient not taking: Reported on 02/04/2022)     No current facility-administered medications for this visit.     Musculoskeletal: Strength & Muscle Tone:  UTA Gait & Station:  Seated Patient leans: N/A  Psychiatric Specialty Exam: Review of Systems  Neurological:  Positive for headaches.  Psychiatric/Behavioral:  Positive for decreased concentration and sleep disturbance. The patient is nervous/anxious.   All other systems reviewed and are negative.   There were no vitals taken for this visit.There is no height or weight on file to calculate BMI.  General Appearance: Casual  Eye Contact:  Fair  Speech:  Clear and Coherent  Volume:  Normal  Mood:  Anxious and Irritable  Affect:  Congruent  Thought Process:  Linear and Descriptions of Associations: Intact  Orientation:  Full (Time, Place,  and Person)  Thought Content: Logical   Suicidal Thoughts:  No  Homicidal Thoughts:  No  Memory:  Immediate;   Fair Recent;   Fair Remote;   Fair  Judgement:  Fair  Insight:  Fair  Psychomotor Activity:  Normal  Concentration:  Concentration: Fair and Attention Span: Fair  Recall:  [employer] of Knowledge: Fair  Language: Fair  Akathisia:  No  Handed:  Right  AIMS (if indicated): done  Assets:  Communication Skills Desire for Improvement Housing Intimacy Talents/Skills Transportation  ADL's:  Intact  Cognition: WNL  Sleep:  Poor   Screenings: AIMS    Flowsheet Row Video Visit from 02/04/2022 in [place] Office Visit from 12/30/2021 in [place] Office Visit from 11/18/2021 in [place] Video Visit from 09/03/2021 in [place] Total Score 0 0 0 3      GAD-7    [place] Office Visit from 12/30/2021 in [place] Video Visit from 06/23/2021 in [place] Office Visit from 05/26/2021 in [place] Video Visit from 01/22/2021 in [place] from 01/19/2021 in [place]  Total GAD-7 Score 1 0 17 2 21       PHQ2-9    [place] Visit from 12/30/2021 in [place] from 11/18/2021 in [place] Video Visit from 09/03/2021 in [place] Video Visit from 06/23/2021 in [place] Visit from 05/26/2021 in [place]  PHQ-2 Total Score 1 2 0 0 1  PHQ-9 Total Score 7 5 -- -- 7      Flowsheet Row Video Visit from 02/04/2022 in [place] Office Visit from 12/30/2021 in [place]  Office Visit from 11/18/2021 in [place] No Risk No Risk No Risk        Assessment and Plan: JOSCELYNE RENVILLE is a 56 year old Caucasian female who has a history of MDD, PTSD, chronic pain, migraine headaches, history of CVA, iron deficiency, prothrombin gene mutation was evaluated by telemedicine today.  Patient is currently struggling with significant headaches which does have an impact on her mood and sleep, will benefit from the following plan.   MDD in remission Cymbalta 120 mg p.o. daily in divided dosage Lamotrigine 100 mg p.o. daily-prescribed by neurology Gabapentin 600 mg p.o. twice daily  Abilify 1 mg p.o. daily-long-term plan to taper off  GAD-unstable Continue Cymbalta and gabapentin as prescribed Current anxiety due to her headaches, will benefit from sufficient pain management.  Insomnia-unstable due to headaches Increase Belsomra to 15 mg p.o. nightly Patient will need sufficient pain management. Reviewed [place] PMP aware.  Follow-up in clinic in 4 to 6 weeks or sooner if needed.     Collaboration of Care: Collaboration of Care: Other patient advised to have sufficient pain management.  Patient/Guardian was advised Release of Information must be obtained prior to any record release in order to collaborate their care with an outside provider. Patient/Guardian was advised if they have not already done so to contact the registration department to sign all necessary forms in order for [name] to release information regarding their care.   Consent: Patient/Guardian gives verbal consent for treatment and assignment of benefits for services provided during this visit. Patient/Guardian expressed understanding and agreed to proceed.   This note was generated in part or whole with voice recognition software. Voice recognition is usually quite accurate but there are transcription errors that can and very often do occur. I apologize for  any typographical errors that were not detected and corrected.      Ursula Alert, MD 02/05/2022, 8:21 AM

## 2022-02-05 ENCOUNTER — Telehealth (HOSPITAL_COMMUNITY): Payer: Self-pay | Admitting: *Deleted

## 2022-02-05 NOTE — Telephone Encounter (Signed)
Reaching out to patient to offer assistance regarding upcoming cardiac imaging study; pt verbalizes understanding of appt date/time, parking situation and where to check in, medications ordered, and verified current allergies; name and call back number provided for further questions should they arise  Gordy Clement RN Navigator Cardiac Imaging [facility] [phone] office [phone] cell  Patient to hold her daily BP medications and take '100mg'$  metoprolol tartrate two hours prior to her cardiac CT scan.

## 2022-02-08 ENCOUNTER — Ambulatory Visit
Admission: RE | Admit: 2022-02-08 | Discharge: 2022-02-08 | Disposition: A | Discharge status: Home / Self Care | Payer: Medicare Other | Source: Ambulatory Visit | Attending: Internal Medicine | Admitting: Internal Medicine

## 2022-02-08 DIAGNOSIS — I2510 Atherosclerotic heart disease of native coronary artery without angina pectoris: Secondary | ICD-10-CM | POA: Insufficient documentation

## 2022-02-08 DIAGNOSIS — R079 Chest pain, unspecified: Secondary | ICD-10-CM | POA: Insufficient documentation

## 2022-02-08 MED ORDER — METOPROLOL TARTRATE 5 MG/5ML IV SOLN
INTRAVENOUS | Status: AC
  Filled 2022-02-08: qty 10

## 2022-02-08 MED ORDER — METOPROLOL TARTRATE 5 MG/5ML IV SOLN: 10.0000 mg | Freq: Once | INTRAVENOUS | Status: AC

## 2022-02-08 MED ORDER — METOPROLOL TARTRATE 5 MG/5ML IV SOLN
10.0000 mg | Freq: Once | INTRAVENOUS | Status: AC
  Administered 2022-02-08: 10 mg via INTRAVENOUS

## 2022-02-08 MED ORDER — NITROGLYCERIN 0.4 MG SL SUBL
0.4000 mg | SUBLINGUAL_TABLET | Freq: Once | SUBLINGUAL | Status: AC
  Administered 2022-02-08: 0.4 mg via SUBLINGUAL

## 2022-02-08 MED ORDER — DILTIAZEM HCL 25 MG/5ML IV SOLN
5.0000 mg | Freq: Once | INTRAVENOUS | Status: AC
  Administered 2022-02-08: 5 mg via INTRAVENOUS

## 2022-02-08 MED ORDER — METOPROLOL TARTRATE 5 MG/5ML IV SOLN
INTRAVENOUS | Status: AC
  Administered 2022-02-08: 10 mg via INTRAVENOUS
  Filled 2022-02-08: qty 10

## 2022-02-08 MED ORDER — DILTIAZEM HCL 25 MG/5ML IV SOLN
INTRAVENOUS | Status: AC
  Filled 2022-02-08: qty 5

## 2022-02-08 MED ORDER — IOHEXOL 350 MG/ML SOLN
100.0000 mL | Freq: Once | INTRAVENOUS | Status: AC | PRN
  Administered 2022-02-08: 100 mL via INTRAVENOUS

## 2022-02-08 MED ORDER — NITROGLYCERIN 0.4 MG SL SUBL
0.8000 mg | SUBLINGUAL_TABLET | Freq: Once | SUBLINGUAL | Status: AC
  Administered 2022-02-08: 0.8 mg via SUBLINGUAL

## 2022-02-08 NOTE — Progress Notes (Addendum)
Patient tolerated procedure well. Ambulate w/o difficulty. Denies any lightheadedness or being dizzy. Pt denies any pain at this time. Sitting in chair, pt is encouraged to drink additional water throughout the day and reason explained to patient. Patient verbalized understanding and all questions answered. Pt instructed to alternate warn and cold compresses on her left forearm to help with the extravasation in her arm per radiology recommendations. ABC intact. No further needs at this time. Discharge from procedure area w/o issues.

## 2022-02-09 ENCOUNTER — Inpatient Hospital Stay: Payer: Medicare Other

## 2022-02-09 VITALS — BP 120/90 | HR 102 | Temp 95.8°F | Resp 20

## 2022-02-09 DIAGNOSIS — D509 Iron deficiency anemia, unspecified: Secondary | ICD-10-CM

## 2022-02-09 MED ORDER — SODIUM CHLORIDE 0.9 % IV SOLN
Freq: Once | INTRAVENOUS | Status: AC
  Filled 2022-02-09: qty 250

## 2022-02-09 MED ORDER — SODIUM CHLORIDE 0.9 % IV SOLN
200.0000 mg | INTRAVENOUS | Status: DC
  Administered 2022-02-09: 200 mg via INTRAVENOUS
  Filled 2022-02-09: qty 200

## 2022-02-09 NOTE — Patient Instructions (Signed)
[facility]  Discharge Instructions: Thank you for choosing [facility] to provide your oncology and hematology care.  If you have a lab appointment with the Cancer Center, please go directly to the Cancer Center and check in at the registration area.  Wear comfortable clothing and clothing appropriate for easy access to any Portacath or PICC line.   We strive to give you quality time with your provider. You may need to reschedule your appointment if you arrive late (15 or more minutes).  Arriving late affects you and other patients whose appointments are after yours.  Also, if you miss three or more appointments without notifying the office, you may be dismissed from the clinic at the provider's discretion.      For prescription refill requests, have your pharmacy contact our office and allow 72 hours for refills to be completed.    Today you received the following chemotherapy and/or immunotherapy agents venofer      To help prevent nausea and vomiting after your treatment, we encourage you to take your nausea medication as directed.  BELOW ARE SYMPTOMS THAT SHOULD BE REPORTED IMMEDIATELY: *FEVER GREATER THAN 100.4 F (38 C) OR HIGHER *CHILLS OR SWEATING *NAUSEA AND VOMITING THAT IS NOT CONTROLLED WITH YOUR NAUSEA MEDICATION *UNUSUAL SHORTNESS OF BREATH *UNUSUAL BRUISING OR BLEEDING *URINARY PROBLEMS (pain or burning when urinating, or frequent urination) *BOWEL PROBLEMS (unusual diarrhea, constipation, pain near the anus) TENDERNESS IN MOUTH AND THROAT WITH OR WITHOUT PRESENCE OF ULCERS (sore throat, sores in mouth, or a toothache) UNUSUAL RASH, SWELLING OR PAIN  UNUSUAL VAGINAL DISCHARGE OR ITCHING   Items with * indicate a potential emergency and should be followed up as soon as possible or go to the Emergency Department if any problems should occur.  Please show the CHEMOTHERAPY ALERT CARD or IMMUNOTHERAPY ALERT CARD at check-in to the  Emergency Department and triage nurse.  Should you have questions after your visit or need to cancel or reschedule your appointment, please contact [facility]  [phone] and follow the prompts.  Office hours are 8:00 a.m. to 4:30 p.m. Monday - Friday. Please note that voicemails left after 4:00 p.m. may not be returned until the following business day.  We are closed weekends and major holidays. You have access to a nurse at all times for urgent questions. Please call the main number to the clinic [phone] and follow the prompts.  For any non-urgent questions, you may also contact your provider using MyChart. We now offer e-Visits for anyone 18 and older to request care online for non-urgent symptoms. For details visit [email or web address].   Also download the MyChart app! Go to the app store, search "MyChart", open the app, select , and log in with your MyChart username and password.  Masks are optional in the cancer centers. If you would like for your care team to wear a mask while they are taking care of you, please let them know. For doctor visits, patients may have with them one support person who is at least 57 years old. At this time, visitors are not allowed in the infusion area.   

## 2022-02-16 ENCOUNTER — Inpatient Hospital Stay: Payer: Medicare Other

## 2022-02-16 VITALS — BP 119/87 | HR 104 | Temp 96.1°F | Resp 18

## 2022-02-16 DIAGNOSIS — D509 Iron deficiency anemia, unspecified: Secondary | ICD-10-CM

## 2022-02-16 MED ORDER — SODIUM CHLORIDE 0.9 % IV SOLN
Freq: Once | INTRAVENOUS | Status: AC
  Filled 2022-02-16: qty 250

## 2022-02-16 MED ORDER — SODIUM CHLORIDE 0.9 % IV SOLN
200.0000 mg | INTRAVENOUS | Status: DC
  Administered 2022-02-16: 200 mg via INTRAVENOUS
  Filled 2022-02-16: qty 200

## 2022-02-16 NOTE — Patient Instructions (Signed)
[facility]  Discharge Instructions: Thank you for choosing [facility] to provide your oncology and hematology care.  If you have a lab appointment with the Cancer Center, please go directly to the Cancer Center and check in at the registration area.  Wear comfortable clothing and clothing appropriate for easy access to any Portacath or PICC line.   We strive to give you quality time with your provider. You may need to reschedule your appointment if you arrive late (15 or more minutes).  Arriving late affects you and other patients whose appointments are after yours.  Also, if you miss three or more appointments without notifying the office, you may be dismissed from the clinic at the provider's discretion.      For prescription refill requests, have your pharmacy contact our office and allow 72 hours for refills to be completed.    Today you received the following chemotherapy and/or immunotherapy agents Venofer.      To help prevent nausea and vomiting after your treatment, we encourage you to take your nausea medication as directed.  BELOW ARE SYMPTOMS THAT SHOULD BE REPORTED IMMEDIATELY: *FEVER GREATER THAN 100.4 F (38 C) OR HIGHER *CHILLS OR SWEATING *NAUSEA AND VOMITING THAT IS NOT CONTROLLED WITH YOUR NAUSEA MEDICATION *UNUSUAL SHORTNESS OF BREATH *UNUSUAL BRUISING OR BLEEDING *URINARY PROBLEMS (pain or burning when urinating, or frequent urination) *BOWEL PROBLEMS (unusual diarrhea, constipation, pain near the anus) TENDERNESS IN MOUTH AND THROAT WITH OR WITHOUT PRESENCE OF ULCERS (sore throat, sores in mouth, or a toothache) UNUSUAL RASH, SWELLING OR PAIN  UNUSUAL VAGINAL DISCHARGE OR ITCHING   Items with * indicate a potential emergency and should be followed up as soon as possible or go to the Emergency Department if any problems should occur.  Please show the CHEMOTHERAPY ALERT CARD or IMMUNOTHERAPY ALERT CARD at check-in to  the Emergency Department and triage nurse.  Should you have questions after your visit or need to cancel or reschedule your appointment, please contact [facility]  [phone] and follow the prompts.  Office hours are 8:00 a.m. to 4:30 p.m. Monday - Friday. Please note that voicemails left after 4:00 p.m. may not be returned until the following business day.  We are closed weekends and major holidays. You have access to a nurse at all times for urgent questions. Please call the main number to the clinic [phone] and follow the prompts.  For any non-urgent questions, you may also contact your provider using MyChart. We now offer e-Visits for anyone 18 and older to request care online for non-urgent symptoms. For details visit [email or web address].   Also download the MyChart app! Go to the app store, search "MyChart", open the app, select [facility], and log in with your MyChart username and password.  Masks are optional in the cancer centers. If you would like for your care team to wear a mask while they are taking care of you, please let them know. For doctor visits, patients may have with them one support person who is at least 57 years old. At this time, visitors are not allowed in the infusion area.   

## 2022-02-17 ENCOUNTER — Ambulatory Visit
Admission: RE | Admit: 2022-02-17 | Discharge: 2022-02-17 | Disposition: A | Discharge status: Home / Self Care | Payer: Medicare Other | Source: Ambulatory Visit | Attending: Neurology | Admitting: Neurology

## 2022-02-17 DIAGNOSIS — G43009 Migraine without aura, not intractable, without status migrainosus: Secondary | ICD-10-CM | POA: Diagnosis present

## 2022-02-17 DIAGNOSIS — G932 Benign intracranial hypertension: Secondary | ICD-10-CM | POA: Diagnosis present

## 2022-02-17 LAB — CSF CELL COUNT WITH DIFFERENTIAL
Eosinophils, CSF: 0 %
Lymphs, CSF: 97 %
Monocyte-Macrophage-Spinal Fluid: 3 %
RBC Count, CSF: 0 /mm3 (ref 0–3)
Segmented Neutrophils-CSF: 0 %
Tube #: 3
WBC, CSF: 2 /mm3 (ref 0–5)

## 2022-02-17 LAB — CRYPTOCOCCAL ANTIGEN, CSF: Crypto Ag: NEGATIVE

## 2022-02-17 LAB — GLUCOSE, CSF: Glucose, CSF: 138 mg/dL — ABNORMAL HIGH (ref 40–70)

## 2022-02-17 LAB — PROTEIN, CSF: Total  Protein, CSF: 21 mg/dL (ref 15–45)

## 2022-02-17 NOTE — Discharge Instructions (Signed)
Lumbar Puncture, Care After Refer to this sheet in the next few weeks. These instructions provide you with information on caring for yourself after your procedure. Your health care provider may also give you more specific instructions. Your treatment has been planned according to current medical practices, but problems sometimes occur. Call your health care provider if you have any problems or questions after your procedure. What can I expect after the procedure? After your procedure, it is typical to have the following sensations: Mild discomfort or pain at the insertion site. Mild headache that is relieved with pain medicines.  Follow these instructions at home:  Avoid lifting anything heavier than 10 lb (4.5 kg) for at least 12 hours after the procedure. Drink enough fluids to keep your urine clear or pale yellow. Lay flat or as flat as possible for the remainder of the day. Contact a health care provider if: You have fever or chills. You have nausea or vomiting. You have a headache that lasts for more than 2 days. Get help right away if: You have any numbness or tingling in your legs. You are unable to control your bowel or bladder. You have bleeding or swelling in your back at the insertion site. You are dizzy or faint. This information is not intended to replace advice given to you by your health care provider. Make sure you discuss any questions you have with your health care provider. Document Released: 05/15/2013 Document Revised: 10/16/2015 Document Reviewed: 01/16/2013 Elsevier Interactive Patient Education  2017 [employer] 

## 2022-02-17 NOTE — Procedures (Addendum)
PROCEDURE SUMMARY:  Successful FL guided Lumbar Puncture from L3/L4.  Yielded 20cc of clear CSF fluid removed Opening Pressure 18cm H2O. Closing Pressure 11cm H2O  No immediate complications.  Pt tolerated well.   Specimen was sent for labs.  EBL negligible  Lorriann Hansmann PA-C 02/17/2022 10:41 AM

## 2022-02-18 ENCOUNTER — Other Ambulatory Visit
Admission: RE | Admit: 2022-02-18 | Discharge: 2022-02-18 | Disposition: A | Discharge status: Home / Self Care | Payer: Medicare Other | Source: Ambulatory Visit | Attending: Student | Admitting: Student

## 2022-02-18 ENCOUNTER — Ambulatory Visit: Payer: Medicare Other

## 2022-02-18 DIAGNOSIS — Z01818 Encounter for other preprocedural examination: Secondary | ICD-10-CM | POA: Insufficient documentation

## 2022-02-18 DIAGNOSIS — R0609 Other forms of dyspnea: Secondary | ICD-10-CM | POA: Diagnosis not present

## 2022-02-18 LAB — BRAIN NATRIURETIC PEPTIDE: B Natriuretic Peptide: 26 pg/mL (ref 0.0–100.0)

## 2022-02-18 LAB — ACID FAST SMEAR (AFB, MYCOBACTERIA): Acid Fast Smear: NEGATIVE

## 2022-02-19 LAB — HSV 1/2 PCR, CSF
HSV-1 DNA: NEGATIVE
HSV-2 DNA: NEGATIVE

## 2022-02-19 LAB — LYME DISEASE DNA BY PCR(BORRELIA BURG): Lyme Disease(B.burgdorferi)PCR: NEGATIVE

## 2022-02-19 LAB — VDRL, CSF: VDRL Quant, CSF: NONREACTIVE

## 2022-02-19 LAB — ANGIOTENSIN CONVERTING ENZYME, CSF: Angio Convert Enzyme: <1.5 U/L (ref 0.0–3.1)

## 2022-02-21 LAB — CSF CULTURE W GRAM STAIN: Culture: NO GROWTH

## 2022-02-22 MED FILL — Iron Sucrose Inj 20 MG/ML (Fe Equiv): INTRAVENOUS | Qty: 10 | Status: AC

## 2022-02-23 ENCOUNTER — Inpatient Hospital Stay: Payer: Medicare Other

## 2022-02-25 ENCOUNTER — Inpatient Hospital Stay: Payer: Medicare Other

## 2022-03-01 ENCOUNTER — Emergency Department: Payer: Medicare Other

## 2022-03-01 ENCOUNTER — Other Ambulatory Visit: Payer: Self-pay

## 2022-03-01 ENCOUNTER — Emergency Department
Admission: EM | Admit: 2022-03-01 | Discharge: 2022-03-01 | Discharge status: Against Medical Advice | Payer: Medicare Other | Attending: Emergency Medicine | Admitting: Emergency Medicine

## 2022-03-01 DIAGNOSIS — Z5321 Procedure and treatment not carried out due to patient leaving prior to being seen by health care provider: Secondary | ICD-10-CM | POA: Insufficient documentation

## 2022-03-01 DIAGNOSIS — R519 Headache, unspecified: Secondary | ICD-10-CM | POA: Insufficient documentation

## 2022-03-01 DIAGNOSIS — G971 Other reaction to spinal and lumbar puncture: Secondary | ICD-10-CM | POA: Diagnosis not present

## 2022-03-01 DIAGNOSIS — R42 Dizziness and giddiness: Secondary | ICD-10-CM | POA: Diagnosis not present

## 2022-03-01 LAB — CBC WITH DIFFERENTIAL/PLATELET
Abs Immature Granulocytes: 0.03 10*3/uL (ref 0.00–0.07)
Basophils Absolute: 0 10*3/uL (ref 0.0–0.1)
Basophils Relative: 1 %
Eosinophils Absolute: 0.2 10*3/uL (ref 0.0–0.5)
Eosinophils Relative: 2 %
HCT: 44.6 % (ref 36.0–46.0)
Hemoglobin: 14.7 g/dL (ref 12.0–15.0)
Immature Granulocytes: 0 %
Lymphocytes Relative: 39 %
Lymphs Abs: 2.9 10*3/uL (ref 0.7–4.0)
MCH: 28.8 pg (ref 26.0–34.0)
MCHC: 33 g/dL (ref 30.0–36.0)
MCV: 87.3 fL (ref 80.0–100.0)
Monocytes Absolute: 0.5 10*3/uL (ref 0.1–1.0)
Monocytes Relative: 7 %
Neutro Abs: 3.8 10*3/uL (ref 1.7–7.7)
Neutrophils Relative %: 51 %
Platelets: 224 10*3/uL (ref 150–400)
RBC: 5.11 MIL/uL (ref 3.87–5.11)
RDW: 12.9 % (ref 11.5–15.5)
WBC: 7.5 10*3/uL (ref 4.0–10.5)
nRBC: 0 % (ref 0.0–0.2)

## 2022-03-01 LAB — COMPREHENSIVE METABOLIC PANEL
ALT: 76 U/L — ABNORMAL HIGH (ref 0–44)
AST: 77 U/L — ABNORMAL HIGH (ref 15–41)
Albumin: 4.2 g/dL (ref 3.5–5.0)
Alkaline Phosphatase: 76 U/L (ref 38–126)
Anion gap: 14 (ref 5–15)
BUN: 10 mg/dL (ref 6–20)
CO2: 24 mmol/L (ref 22–32)
Calcium: 9.4 mg/dL (ref 8.9–10.3)
Chloride: 99 mmol/L (ref 98–111)
Creatinine, Ser: 0.78 mg/dL (ref 0.44–1.00)
GFR, Estimated: >60 mL/min (ref >60)
Glucose, Bld: 186 mg/dL — ABNORMAL HIGH (ref 70–99)
Potassium: 3.7 mmol/L (ref 3.5–5.1)
Sodium: 137 mmol/L (ref 135–145)
Total Bilirubin: 0.7 mg/dL (ref 0.3–1.2)
Total Protein: 7.8 g/dL (ref 6.5–8.1)

## 2022-03-01 MED FILL — Iron Sucrose Inj 20 MG/ML (Fe Equiv): INTRAVENOUS | Qty: 10 | Status: AC

## 2022-03-01 NOTE — ED Provider Triage Note (Signed)
  Emergency Medicine Provider Triage Evaluation Note  Christine Mcneil , a 56 y.o.female,  was evaluated in triage.  Pt complains of headache.  Patient is that she had a lumbar puncture last week and has had a headache ever since.  She states that her doctor told her that it may be from a CSF leak.  She additionally states that she has been experiencing dizziness/vertigo persistently as well.  She states it is getting worse.   Review of Systems  Positive: Headache, dizziness Negative: Denies fever, chest pain, vomiting  Physical Exam  There were no vitals filed for this visit. Gen:   Awake, no distress   Resp:  Normal effort  MSK:   Moves extremities without difficulty  Other:    Medical Decision Making  Given the patient's initial medical screening exam, the following diagnostic evaluation has been ordered. The patient will be placed in the appropriate treatment space, once one is available, to complete the evaluation and treatment. I have discussed the plan of care with the patient and I have advised the patient that an ED physician or mid-level practitioner will reevaluate their condition after the test results have been received, as the results may give them additional insight into the type of treatment they may need.    Diagnostics: Labs, head CT  Treatments: none immediately   Teodoro Spray, [place] 03/01/22 1858

## 2022-03-01 NOTE — ED Triage Notes (Signed)
Pt presents via POV c/o headache x1 week. 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 Reports told to come to ED for possible CSF leak.

## 2022-03-02 ENCOUNTER — Inpatient Hospital Stay: Payer: Medicare Other

## 2022-03-02 ENCOUNTER — Other Ambulatory Visit: Payer: Self-pay | Admitting: Neurology

## 2022-03-02 ENCOUNTER — Ambulatory Visit
Admission: RE | Admit: 2022-03-02 | Discharge: 2022-03-02 | Disposition: A | Discharge status: Home / Self Care | Payer: Medicare Other | Source: Ambulatory Visit | Attending: Neurology | Admitting: Neurology

## 2022-03-02 DIAGNOSIS — G9600 Cerebrospinal fluid leak, unspecified: Secondary | ICD-10-CM

## 2022-03-02 MED ORDER — IOPAMIDOL (ISOVUE-M 200) INJECTION 41%
1.0000 mL | Freq: Once | INTRAMUSCULAR | Status: AC
  Administered 2022-03-02: 1 mL via EPIDURAL

## 2022-03-02 NOTE — Progress Notes (Signed)
RN Killian Ress drew approximately 20 ml of blood from pt. On 03/02/22 @ 1600 And immediately handed to Dr. Denna Haggard to perform blood patch on pt.

## 2022-03-02 NOTE — Discharge Instructions (Signed)
Blood Patch Discharge Instructions  Go home and rest quietly for the next 24 hours.  It is important to lie flat for the next 24 hours.  Get up only to go to the restroom.  You may lie in the bed or on a couch on your back, your stomach, your left side or your right side.  You may have one pillow under your head.  You may have pillows between your knees while you are on your side or under your knees while you are on your back.  DO NOT drive today.  Recline the seat as far back as it will go, while still wearing your seat belt, on the way home.  You may get up to go to the bathroom as needed.  You may sit up for 10 minutes to eat.  You may resume your normal diet and medications unless otherwise indicated.  Drink lots of extra fluids today and tomorrow..   You may resume normal activities after your 24 hours of bed rest is over; however, do not exert yourself strongly or do any heavy lifting tomorrow.  Call your physician for a follow-up appointment.   If you have any questions  after you arrive home, please call [phone].  Discharge instructions have been explained to the patient.  The patient, or the person responsible for the patient, fully understands these instructions.

## 2022-03-08 ENCOUNTER — Encounter: Payer: Self-pay | Admitting: Oncology

## 2022-03-08 MED FILL — Iron Sucrose Inj 20 MG/ML (Fe Equiv): INTRAVENOUS | Qty: 10 | Status: AC

## 2022-03-09 ENCOUNTER — Inpatient Hospital Stay: Payer: Medicare Other | Attending: Oncology

## 2022-03-09 DIAGNOSIS — D509 Iron deficiency anemia, unspecified: Secondary | ICD-10-CM | POA: Insufficient documentation

## 2022-03-10 ENCOUNTER — Other Ambulatory Visit: Payer: Self-pay

## 2022-03-10 ENCOUNTER — Encounter: Payer: Self-pay | Admitting: Internal Medicine

## 2022-03-10 ENCOUNTER — Ambulatory Visit
Admission: RE | Admit: 2022-03-10 | Discharge: 2022-03-10 | Disposition: A | Discharge status: Home / Self Care | Payer: Medicare Other | Attending: Internal Medicine | Admitting: Internal Medicine

## 2022-03-10 ENCOUNTER — Encounter
Admission: RE | Disposition: A | Discharge status: Home / Self Care | Payer: Self-pay | Source: Home / Self Care | Attending: Internal Medicine

## 2022-03-10 DIAGNOSIS — I2510 Atherosclerotic heart disease of native coronary artery without angina pectoris: Secondary | ICD-10-CM

## 2022-03-10 DIAGNOSIS — I25110 Atherosclerotic heart disease of native coronary artery with unstable angina pectoris: Secondary | ICD-10-CM | POA: Insufficient documentation

## 2022-03-10 DIAGNOSIS — I200 Unstable angina: Secondary | ICD-10-CM

## 2022-03-10 HISTORY — PX: LEFT HEART CATH AND CORONARY ANGIOGRAPHY: CATH118249

## 2022-03-10 LAB — GLUCOSE, CAPILLARY: Glucose-Capillary: 164 mg/dL — ABNORMAL HIGH (ref 70–99)

## 2022-03-10 SURGERY — LEFT HEART CATH AND CORONARY ANGIOGRAPHY
Anesthesia: Moderate Sedation

## 2022-03-10 MED ORDER — SODIUM CHLORIDE 0.9 % WEIGHT BASED INFUSION
3.0000 mL/kg/h | INTRAVENOUS | Status: AC
  Administered 2022-03-10: 3 mL/kg/h via INTRAVENOUS

## 2022-03-10 MED ORDER — LIDOCAINE HCL (PF) 1 % IJ SOLN
INTRAMUSCULAR | Status: DC | PRN
  Administered 2022-03-10: 2 mL
  Administered 2022-03-10: 15 mL

## 2022-03-10 MED ORDER — VERAPAMIL HCL 2.5 MG/ML IV SOLN
INTRAVENOUS | Status: DC | PRN
  Administered 2022-03-10: 2.5 mg via INTRA_ARTERIAL

## 2022-03-10 MED ORDER — ACETAMINOPHEN 325 MG PO TABS: 650.0000 mg | ORAL_TABLET | ORAL | Status: DC | PRN

## 2022-03-10 MED ORDER — FENTANYL CITRATE (PF) 100 MCG/2ML IJ SOLN
INTRAMUSCULAR | Status: DC | PRN
  Administered 2022-03-10: 50 ug via INTRAVENOUS

## 2022-03-10 MED ORDER — LABETALOL HCL 5 MG/ML IV SOLN: 10.0000 mg | INTRAVENOUS | Status: DC | PRN

## 2022-03-10 MED ORDER — FENTANYL CITRATE (PF) 100 MCG/2ML IJ SOLN
INTRAMUSCULAR | Status: AC
  Filled 2022-03-10: qty 2

## 2022-03-10 MED ORDER — MIDAZOLAM HCL 2 MG/2ML IJ SOLN
INTRAMUSCULAR | Status: AC
  Filled 2022-03-10: qty 2

## 2022-03-10 MED ORDER — HEPARIN (PORCINE) IN NACL 1000-0.9 UT/500ML-% IV SOLN
INTRAVENOUS | Status: AC
  Filled 2022-03-10: qty 1000

## 2022-03-10 MED ORDER — SODIUM CHLORIDE 0.9% FLUSH: 3.0000 mL | INTRAVENOUS | Status: DC | PRN

## 2022-03-10 MED ORDER — ASPIRIN 81 MG PO CHEW
81.0000 mg | CHEWABLE_TABLET | ORAL | Status: AC
  Administered 2022-03-10: 81 mg via ORAL

## 2022-03-10 MED ORDER — ONDANSETRON HCL 4 MG/2ML IJ SOLN: 4.0000 mg | Freq: Four times a day (QID) | INTRAMUSCULAR | Status: DC | PRN

## 2022-03-10 MED ORDER — ASPIRIN 81 MG PO CHEW
CHEWABLE_TABLET | ORAL | Status: AC
  Filled 2022-03-10: qty 1

## 2022-03-10 MED ORDER — SODIUM CHLORIDE 0.9 % WEIGHT BASED INFUSION
1.0000 mL/kg/h | INTRAVENOUS | Status: DC
  Administered 2022-03-10: 1 mL/kg/h via INTRAVENOUS

## 2022-03-10 MED ORDER — SODIUM CHLORIDE 0.9 % IV SOLN: 250.0000 mL | INTRAVENOUS | Status: DC | PRN

## 2022-03-10 MED ORDER — SODIUM CHLORIDE 0.9 % WEIGHT BASED INFUSION: 1.0000 mL/kg/h | INTRAVENOUS | Status: DC

## 2022-03-10 MED ORDER — HEPARIN SODIUM (PORCINE) 1000 UNIT/ML IJ SOLN
INTRAMUSCULAR | Status: AC
  Filled 2022-03-10: qty 10

## 2022-03-10 MED ORDER — HEPARIN (PORCINE) IN NACL 1000-0.9 UT/500ML-% IV SOLN
INTRAVENOUS | Status: DC | PRN
  Administered 2022-03-10: 1000 mL

## 2022-03-10 MED ORDER — SODIUM CHLORIDE 0.9% FLUSH: 3.0000 mL | Freq: Two times a day (BID) | INTRAVENOUS | Status: DC

## 2022-03-10 MED ORDER — HYDRALAZINE HCL 20 MG/ML IJ SOLN: 10.0000 mg | INTRAMUSCULAR | Status: DC | PRN

## 2022-03-10 MED ORDER — VERAPAMIL HCL 2.5 MG/ML IV SOLN
INTRAVENOUS | Status: AC
  Filled 2022-03-10: qty 2

## 2022-03-10 MED ORDER — LIDOCAINE HCL 1 % IJ SOLN
INTRAMUSCULAR | Status: AC
  Filled 2022-03-10: qty 20

## 2022-03-10 MED ORDER — MIDAZOLAM HCL 2 MG/2ML IJ SOLN
INTRAMUSCULAR | Status: DC | PRN
  Administered 2022-03-10: 1 mg via INTRAVENOUS

## 2022-03-10 SURGICAL SUPPLY — 17 items
BAND ZEPHYR COMPRESS 30 LONG (HEMOSTASIS) IMPLANT
CATH 5FR JL3.5 JR4 ANG PIG MP (CATHETERS) IMPLANT
CATH INFINITI 5FR JL4 (CATHETERS) IMPLANT
COVER EZ STRL 42X30 (DRAPES) IMPLANT
DEVICE CLOSURE MYNXGRIP 5F (Vascular Products) IMPLANT
DRAPE BRACHIAL (DRAPES) IMPLANT
GLIDESHEATH SLEND SS 6F .021 (SHEATH) IMPLANT
GUIDEWIRE INQWIRE 1.5J.035X260 (WIRE) IMPLANT
INQWIRE 1.5J .035X260CM (WIRE) ×1
NDL PERC 18GX7CM (NEEDLE) IMPLANT
NEEDLE PERC 18GX7CM (NEEDLE) ×1 IMPLANT
PACK CARDIAC CATH (CUSTOM PROCEDURE TRAY) ×1 IMPLANT
PROTECTION STATION PRESSURIZED (MISCELLANEOUS) ×1
SET ATX SIMPLICITY (MISCELLANEOUS) IMPLANT
SHEATH AVANTI 5FR X 11CM (SHEATH) IMPLANT
STATION PROTECTION PRESSURIZED (MISCELLANEOUS) IMPLANT
WIRE GUIDERIGHT .035X150 (WIRE) IMPLANT

## 2022-03-11 MED ORDER — IOHEXOL 300 MG/ML  SOLN
INTRAMUSCULAR | Status: AC | PRN
  Administered 2022-03-10: 60 mL

## 2022-03-12 NOTE — Progress Notes (Signed)
BNP performed due to symptoms of dyspnea concerning for CHF and for pre-catheterization labs.

## 2022-03-14 LAB — CARDIAC CATHETERIZATION: Cath EF Quantitative: 60 %

## 2022-03-15 ENCOUNTER — Encounter: Payer: Self-pay | Admitting: Internal Medicine

## 2022-03-16 ENCOUNTER — Inpatient Hospital Stay: Payer: Medicare Other

## 2022-03-16 VITALS — BP 130/79 | HR 92 | Temp 97.0°F | Resp 16

## 2022-03-16 DIAGNOSIS — D509 Iron deficiency anemia, unspecified: Secondary | ICD-10-CM | POA: Diagnosis not present

## 2022-03-16 MED ORDER — SODIUM CHLORIDE 0.9 % IV SOLN
200.0000 mg | INTRAVENOUS | Status: DC
  Administered 2022-03-16: 200 mg via INTRAVENOUS
  Filled 2022-03-16: qty 200

## 2022-03-16 MED ORDER — CYANOCOBALAMIN 1000 MCG/ML IJ SOLN: 1000.0000 ug | INTRAMUSCULAR | Status: DC

## 2022-03-16 MED ORDER — SODIUM CHLORIDE 0.9 % IV SOLN
Freq: Once | INTRAVENOUS | Status: AC
  Filled 2022-03-16: qty 250

## 2022-03-16 NOTE — Patient Instructions (Signed)

## 2022-03-29 ENCOUNTER — Encounter: Payer: Self-pay | Admitting: Psychiatry

## 2022-03-29 ENCOUNTER — Telehealth (INDEPENDENT_AMBULATORY_CARE_PROVIDER_SITE_OTHER): Payer: Medicare Other | Admitting: Psychiatry

## 2022-03-29 DIAGNOSIS — G4701 Insomnia due to medical condition: Secondary | ICD-10-CM | POA: Diagnosis not present

## 2022-03-29 DIAGNOSIS — F411 Generalized anxiety disorder: Secondary | ICD-10-CM | POA: Diagnosis not present

## 2022-03-29 DIAGNOSIS — F3342 Major depressive disorder, recurrent, in full remission: Secondary | ICD-10-CM | POA: Diagnosis not present

## 2022-03-29 DIAGNOSIS — F4310 Post-traumatic stress disorder, unspecified: Secondary | ICD-10-CM | POA: Diagnosis not present

## 2022-03-29 DIAGNOSIS — Z634 Disappearance and death of family member: Secondary | ICD-10-CM

## 2022-03-29 MED ORDER — HYDROXYZINE PAMOATE 25 MG PO CAPS: ORAL_CAPSULE | ORAL | 0 refills | Status: DC

## 2022-03-29 MED ORDER — BELSOMRA 15 MG PO TABS: 15.0000 mg | ORAL_TABLET | Freq: Every day | ORAL | 1 refills | Status: DC

## 2022-03-29 NOTE — Progress Notes (Unsigned)
Virtual Visit via Telephone Note  I connected with Christine Mcneil on 03/29/22 at  1:20 PM EST by telephone and verified that I am speaking with the correct person using two identifiers.  Location Provider Location : [facility] Patient Location : Home  Participants: Patient , Provider   I discussed the limitations, risks, security and privacy concerns of performing an evaluation and management service by telephone and the availability of in person appointments. I also discussed with the patient that there may be a patient responsible charge related to this service. The patient expressed understanding and agreed to proceed.   I discussed the assessment and treatment plan with the patient. The patient was provided an opportunity to ask questions and all were answered. The patient agreed with the plan and demonstrated an understanding of the instructions.   The patient was advised to call back or seek an in-person evaluation if the symptoms worsen or if the condition fails to improve as anticipated.    [place] MD OP Progress Note  03/30/2022 8:28 AM Christine Mcneil  MRN:  [id number]  Chief Complaint:  Chief Complaint  Patient presents with   Follow-up   Depression   Anxiety   Medication Refill   HPI: Christine Mcneil is a 56 year old Caucasian female, divorced, lives in [place], has a history of MDD, PTSD, insomnia, OSA, history of CVA, history of prothrombin gene mutation, chronic pain, migraine headaches, vitamin B12 deficiency, iron deficiency was evaluated by phone today.  Patient today reports she currently struggles with headache, not responding to any of her medication she is on.  Continues to follow-up with neurology.  The headache does have an impact on her sleep.  She reports she is currently taking Belsomra which helps to some extent.  Not interested in dosage increase of Belsomra, would like to wait until her headaches are more under control.  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  Patient denies any suicidality, homicidality or perceptual disturbances.  Currently compliant on medications.  Denies side effects.  Patient agreeable to referral to psychotherapist.  Patient denies any other concerns today.  Visit Diagnosis:    ICD-10-CM   1. MDD (major depressive disorder), recurrent, in full remission ([place])  F33.42     2. PTSD (post-traumatic stress disorder)  F43.10     3. GAD (generalized anxiety disorder)  F41.1 hydrOXYzine (VISTARIL) 25 MG capsule    4. Insomnia due to medical condition  G47.01 Suvorexant (BELSOMRA) 15 MG TABS   Headaches    5. Bereavement  Z63.4       Past Psychiatric History: Reviewed past psychiatric history from progress note on 08/16/2017.  Past trials of Effexor, Klonopin.  Past Medical History:  Past Medical History:  Diagnosis Date   Abdominal pain    Anxiety and depression    Atypical facial pain    Bilateral occipital neuralgia    Cervical spondylosis without myelopathy    Chronic daily headache    Chronic pain    Chronic pelvic pain in female    Chronic thoracic back pain    Depression    Diabetes mellitus without complication (HCC)    Diabetes mellitus, type II (HCC)    Dysphagia    Dysrhythmia    Fibromyalgia    Hyperlipidemia    Hypertension    Hypothyroidism    Insomnia    Iron deficiency anemia    Left hip pain    Low back pain  Migraines    Numbness    Optic neuropathy    OSA (obstructive sleep apnea)    Polyarthralgia    Primary osteoarthritis of both hips    Prothrombin gene mutation (HCC)    Pseudotumor cerebri    Rectal bleeding    Right shoulder pain    Rotator cuff syndrome    Stroke [facility])    Thyroid disease    TIA (transient ischemic attack) 05/27/2017    Past Surgical History:  Procedure Laterality Date   ABDOMINAL HYSTERECTOMY     total   CHOLECYSTECTOMY     COLONOSCOPY WITH PROPOFOL N/A 03/21/2018   Procedure: COLONOSCOPY WITH  PROPOFOL;  Surgeon: Lucilla Lame, MD;  Location: [facility] ENDOSCOPY;  Service: Endoscopy;  Laterality: N/A;   ERCP N/A 08/19/2020   Procedure: ENDOSCOPIC RETROGRADE CHOLANGIOPANCREATOGRAPHY (ERCP);  Surgeon: Lucilla Lame, MD;  Location: [facility] ENDOSCOPY;  Service: Endoscopy;  Laterality: N/A;   ESOPHAGOGASTRODUODENOSCOPY (EGD) WITH PROPOFOL N/A 03/21/2018   Procedure: ESOPHAGOGASTRODUODENOSCOPY (EGD) WITH PROPOFOL;  Surgeon: Lucilla Lame, MD;  Location: [facility] ENDOSCOPY;  Service: Endoscopy;  Laterality: N/A;   GIVENS CAPSULE STUDY N/A 09/24/2019   Procedure: GIVENS CAPSULE STUDY;  Surgeon: Virgel Manifold, MD;  Location: [facility] ENDOSCOPY;  Service: Endoscopy;  Laterality: N/A;   KNEE SURGERY Left    LEFT HEART CATH AND CORONARY ANGIOGRAPHY N/A 03/10/2022   Procedure: LEFT HEART CATH AND CORONARY ANGIOGRAPHY;  Surgeon: Yolonda Kida, MD;  Location: [place] [facility] LAB;  Service: Cardiovascular;  Laterality: N/A;   ROTATOR CUFF REPAIR Right    spinal neurostimulator      Family Psychiatric History: Reviewed family psychiatric history from progress note on 08/16/2017.  Family History:  Family History  Problem Relation Age of Onset   Diabetes Mother    Hyperlipidemia Mother    Hypertension Mother    COPD Mother    CVA Mother    Anemia Mother    Diabetes Father    Hyperlipidemia Father    Hypertension Father    Thyroid disease Father    Alcohol abuse Father    Peripheral vascular disease Sister    Hypertension Sister    Anxiety disorder Son    Depression Son    Bipolar disorder Son    Seizures Son     Social History: Reviewed social history from progress note on 08/16/2017. Social History   Socioeconomic History   Marital status: Divorced    Spouse name: Not on file   Number of children: 1   Years of education: Not on file   Highest education level: High school graduate  Occupational History    Comment: disablitiy  Tobacco Use   Smoking status: Never   Smokeless tobacco:  Never  Vaping Use   Vaping Use: Never used  Substance and Sexual Activity   Alcohol use: No   Drug use: No   Sexual activity: Not Currently  Other Topics Concern   Not on file  Social History Narrative   Not on file   Social Determinants of Health   Financial Resource Strain: Low Risk  (07/19/2017)   Overall Financial Resource Strain (CARDIA)    Difficulty of Paying Living Expenses: Not hard at all  Food Insecurity: No Food Insecurity (07/19/2017)   Hunger Vital Sign    Worried About Running Out of Food in the Last Year: Never true    Ran Out of Food in the Last Year: Never true  Transportation Needs: No Transportation Needs (07/19/2017)   PRAPARE - Transportation  Lack of Transportation (Medical): No    Lack of Transportation (Non-Medical): No  Physical Activity: Inactive (07/19/2017)   Exercise Vital Sign    Days of Exercise per Week: 0 days    Minutes of Exercise per Session: 0 min  Stress: Stress Concern Present (07/19/2017)   [place]    Feeling of Stress : Very much  Social Connections: Moderately Isolated (07/19/2017)   Social Connection and Isolation Panel [NHANES]    Frequency of Communication with Friends and Family: Once a week    Frequency of Social Gatherings with Friends and Family: Never    Attends Religious Services: More than 4 times per year    Active Member of [employer] or Organizations: No    Attends Archivist Meetings: Never    Marital Status: Divorced    Allergies:  Allergies  Allergen Reactions   Amoxapine Itching   Amoxicillin Itching   Dapagliflozin Itching    Other reaction(s): Other (See Comments) Thrush & vaginal itching   Keflex [Cephalexin] Itching   Mirtazapine Other (See Comments)    Pt states felling "like my body is outside of me."    Metabolic Disorder Labs: Lab Results  Component Value Date   HGBA1C 9.9 (H) 06/21/2017   MPG 237.43 06/21/2017   Lab  Results  Component Value Date   PROLACTIN 4.3 (L) 05/26/2021   PROLACTIN 5.5 09/06/2017   Lab Results  Component Value Date   CHOL 157 05/26/2021   TRIG 245 (H) 05/26/2021   HDL 32 (L) 05/26/2021   CHOLHDL 4.9 (H) 05/26/2021   VLDL 39 06/21/2017   LDLCALC 84 05/26/2021   LDLCALC 174 (H) 06/21/2017   Lab Results  Component Value Date   TSH 2.890 09/06/2017   TSH 3.176 01/04/2017    Therapeutic Level Labs: No results found for: "LITHIUM" No results found for: "VALPROATE" No results found for: "CBMZ"  Current Medications: Current Outpatient Medications  Medication Sig Dispense Refill   ACCU-CHEK AVIVA PLUS test strip      ACCU-CHEK SOFTCLIX LANCETS lancets      Acetaminophen (TYLENOL ARTHRITIS EXT RELIEF PO) Take 600 mg by mouth daily.     albuterol (VENTOLIN HFA) 108 (90 Base) MCG/ACT inhaler Inhale 2 puffs into the lungs every 6 (six) hours as needed for wheezing or shortness of breath. 1 g 0   ARIPiprazole (ABILIFY) 2 MG tablet Take 0.5 tablets (1 mg total) by mouth daily. 45 tablet 0   atorvastatin (LIPITOR) 80 MG tablet Take 80 mg by mouth daily at 6 PM.     Blood Glucose Monitoring Suppl (ACCU-CHEK AVIVA PLUS) w/Device KIT      Carboxymethylcellulose Sod PF 0.5 % SOLN Apply 1 drop to eye daily as needed.     Cholecalciferol (VITAMIN D3) 1000 units CAPS Take 1 capsule by mouth daily.     clobetasol ointment (TEMOVATE) 0.05 % Apply topically.     clotrimazole (LOTRIMIN) 1 % cream Apply 1 application topically daily as needed.     Continuous Blood Gluc Receiver (DEXCOM G6 RECEIVER) DEVI Use to monitor blood sugar. [place] [phone]     Continuous Blood Gluc Sensor (DEXCOM G6 SENSOR) MISC Use to monitor blood sugar.  Replace every 10 days. [place] [phone].     Continuous Blood Gluc Transmit (DEXCOM G6 TRANSMITTER) MISC Use to monitor blood sugar.  Replace every 3 months. [place] [phone].     cyclobenzaprine (FLEXERIL) 5 MG tablet Take 1 tablet by mouth 3 (  three) times  daily as needed.     dabigatran (PRADAXA) 150 MG CAPS capsule Take 150 mg by mouth 2 (two) times daily.     diltiazem (CARDIZEM CD) 240 MG 24 hr capsule Take by mouth.     DULoxetine (CYMBALTA) 60 MG capsule Take 1 capsule (60 mg total) by mouth 2 (two) times daily. 180 capsule 0   esomeprazole (NEXIUM) 40 MG capsule Take 40 mg by mouth daily.     ezetimibe (ZETIA) 10 MG tablet Take 10 mg by mouth at bedtime.     famotidine (PEPCID) 40 MG tablet Take 40 mg by mouth at bedtime.     ferrous sulfate 325 (65 FE) MG tablet Take by mouth. Take 325 mg daily with breakfast     fluticasone (FLONASE) 50 MCG/ACT nasal spray Place 2 sprays into both nostrils as needed.      furosemide (LASIX) 40 MG tablet TAKE (1) TABLET BY MOUTH EVERY DAY     gabapentin (NEURONTIN) 600 MG tablet TAKE (1) TABLET BY MOUTH TWICE DAILY (Patient taking differently: Take 600 mg by mouth at bedtime. TAKE (1) TABLET BY MOUTH TWICE DAILY) 180 tablet 0   insulin degludec (TRESIBA FLEXTOUCH) 200 UNIT/ML FlexTouch Pen Inject into the skin.     Insulin Lispro w/ Trans Port 100 UNIT/ML SOPN INJECT UP TO 50 UNITS DAILY DIVIDED DOSES, AS DIRECTED     Insulin Pen Needle (TRUEPLUS PEN NEEDLES) 29G X 12MM MISC      levothyroxine (SYNTHROID, LEVOTHROID) 100 MCG tablet Take 100 mcg by mouth daily.     magnesium oxide (MAG-OX) 400 MG tablet Take 1 tablet by mouth daily.     Melatonin 10 MG TABS Take 2 tablets by mouth at bedtime.     meloxicam (MOBIC) 15 MG tablet Take 15 mg by mouth daily.     metFORMIN (GLUCOPHAGE-XR) 500 MG 24 hr tablet Take 500 mg by mouth 2 (two) times daily.      metoprolol succinate (TOPROL-XL) 25 MG 24 hr tablet Take 1 tablet by mouth daily.     mometasone (ELOCON) 0.1 % cream Apply topically.     naloxone (NARCAN) nasal spray 4 mg/0.1 mL Place into the nose.     nystatin (MYCOSTATIN) 100000 UNIT/ML suspension Take 5 mLs by mouth 4 (four) times daily.     omeprazole (PRILOSEC) 20 MG capsule TAKE (1) CAPSULE BY MOUTH  EVERY DAY 30 capsule 0   ondansetron (ZOFRAN) 4 MG tablet Take 4 mg by mouth every 8 (eight) hours as needed.     pioglitazone (ACTOS) 15 MG tablet Take by mouth. Take 1 tablet by mouth once daily     Riboflavin 400 MG TABS Take 1 tablet by mouth every night  for headaches     RYBELSUS 7 MG TABS Take 1 tablet by mouth daily.     TOUJEO SOLOSTAR 300 UNIT/ML Solostar Pen SMARTSIG:76 Unit(s) SUB-Q Every Night     Ubrogepant 50 MG TABS      vitamin B-12 (CYANOCOBALAMIN) 100 MCG tablet Take 1 tablet (100 mcg total) by mouth daily. 90 tablet 1   XTAMPZA ER 9 MG C12A PLEASE SEE ATTACHED FOR DETAILED DIRECTIONS     hydrOXYzine (VISTARIL) 25 MG capsule TAKE 1 CAPSULE BY MOUTH TWICE DAILY AS NEEDED. FOR ANXIETY 180 capsule 0   nitroGLYCERIN (NITROSTAT) 0.4 MG SL tablet Place under the tongue.     Suvorexant (BELSOMRA) 15 MG TABS Take 15 mg by mouth at bedtime. 30 tablet 1  No current facility-administered medications for this visit.   Facility-Administered Medications Ordered in Other Visits  Medication Dose Route Frequency Provider Last Rate Last Admin   iohexol (OMNIPAQUE) 300 MG/ML solution    PRN Callwood, Dwayne D, MD   60 mL at 03/10/22 1442     Musculoskeletal: Strength & Muscle Tone:  [place]:  UTA Patient leans: N/A  Psychiatric Specialty Exam: Review of Systems  Neurological:  Positive for headaches.  Psychiatric/Behavioral:  Positive for sleep disturbance. The patient is nervous/anxious.   All other systems reviewed and are negative.   There were no vitals taken for this visit.There is no height or weight on file to calculate BMI.  General Appearance:  UTA  Eye Contact:   UTA  Speech:  Clear and Coherent  Volume:  Normal  Mood:  Anxious  Affect:   UTA  Thought Process:  Goal Directed and Descriptions of Associations: Intact  Orientation:  Full (Time, Place, and Person)  Thought Content: Logical   Suicidal Thoughts:  No  Homicidal Thoughts:  No  Memory:   Immediate;   Fair Recent;   Fair Remote;   Fair  Judgement:  Fair  Insight:  Fair  Psychomotor Activity:   UTA  Concentration:  Concentration: Fair and Attention Span: Fair  Recall:  [employer] of Knowledge: Fair  Language: Fair  Akathisia:  No  Handed:  Right  AIMS (if indicated): not done  Assets:  Communication Skills Desire for Improvement Housing Social Support  ADL's:  Intact  Cognition: WNL  Sleep:  Poor   Screenings: AIMS    Flowsheet Row Video Visit from 02/04/2022 in [place] Office Visit from 12/30/2021 in [place] Office Visit from 11/18/2021 in [place] Video Visit from 09/03/2021 in [place] Total Score 0 0 0 3      GAD-7    Flowsheet Row Video Visit from 03/29/2022 in [place] Visit from 12/30/2021 in [place] Video Visit from 06/23/2021 in [place] from 05/26/2021 in [place] Video Visit from 01/22/2021 in [place]  Total GAD-7 Score 4 1 0 17 2      PHQ2-9    [place] Visit from 12/30/2021 in [place] Visit from 11/18/2021 in [place] Video Visit from 09/03/2021 in [place] Video Visit from 06/23/2021 in [place] Visit from 05/26/2021 in [place]  PHQ-2 Total Score 1 2 0 0 1  PHQ-9 Total Score 7 5 -- -- 7      Flowsheet Row Video Visit from 03/29/2022 in [place] ED from 03/01/2022 in [place] Video Visit from 02/04/2022 in [place] No Risk No Risk No Risk         Assessment and Plan: SHATORIA STOOKSBURY is a 56 year old Caucasian female who has a history of MDD, PTSD, chronic pain, migraine headaches, history of CVA, iron deficiency, prothrombin gene mutation was evaluated by phone today.  Patient with continued headaches which does have an impact on her mood, sleep, will benefit from sufficient management of her pain.  Plan as noted below.  Plan MDD in remission Cymbalta 120 mg p.o. daily in divided dosage Gabapentin 600 mg p.o. twice daily Abilify 1 mg p.o.  daily.  Long-term plan is to taper off.  GAD-unstable due to headaches. Patient will need sufficient management of her pain Discussed referral to CBT.  Patient agreeable.  I have made the referral. Continue Cymbalta and gabapentin.  Insomnia-unstable due to headaches. Continue Belsomra 15 mg p.o. nightly Once pain is managed will reevaluate for the need for increasing the dosage of this medication. Reviewed [place] PMP AWARxE  Follow-up in clinic in 4 weeks or sooner if needed.   Collaboration of Care: Collaboration of Care: Referral or follow-up with counselor/therapist AEB encouraged to establish care with therapist.  Patient/Guardian was advised Release of Information must be obtained prior to any record release in order to collaborate their care with an outside provider. Patient/Guardian was advised if they have not already done so to contact the registration department to sign all necessary forms in order for [name] to release information regarding their care.   Consent: Patient/Guardian gives verbal consent for treatment and assignment of benefits for services provided during this visit. Patient/Guardian expressed understanding and agreed to proceed.    I have spent at least 18 minutes non face to face with patient today.    This note was generated in part or whole with voice recognition software. Voice recognition is usually quite accurate but there are transcription errors that can  and very often do occur. I apologize for any typographical errors that were not detected and corrected.      Ursula Alert, MD 03/30/2022, 8:28 AM

## 2022-04-01 LAB — ACID FAST CULTURE WITH REFLEXED SENSITIVITIES (MYCOBACTERIA): Acid Fast Culture: NEGATIVE

## 2022-04-20 ENCOUNTER — Ambulatory Visit (INDEPENDENT_AMBULATORY_CARE_PROVIDER_SITE_OTHER): Payer: Medicare Other | Admitting: Licensed Clinical Social Worker

## 2022-04-20 DIAGNOSIS — F411 Generalized anxiety disorder: Secondary | ICD-10-CM

## 2022-04-20 DIAGNOSIS — F3342 Major depressive disorder, recurrent, in full remission: Secondary | ICD-10-CM | POA: Diagnosis not present

## 2022-04-20 DIAGNOSIS — F4310 Post-traumatic stress disorder, unspecified: Secondary | ICD-10-CM
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
## 2022-04-20 NOTE — Plan of Care (Signed)
Initial visit with pt on 04/20/2022 to discuss treatment goals and complete CCA.  Problem: Trauma  Goal:  Trauma  Description: Pt will explore coping skills and learn coping and grounding skills to reduce symptoms of PTSD and prepare to handle future stressful situations as evidenced by implementing coping skills per pt report 3 out of 5 documented sessions.   Outcome: Not Progressing   Problem: Depression Goal:  Depression  Description: Reduce overall frequency, intensity and duration of depression so that daily functioning is not impaired per pt self report 3 out of 5 sessions documented.   Outcome: Not Progressing Goal: STG: [place] WILL PARTICIPATE IN AT LEAST 80% OF SCHEDULED INDIVIDUAL PSYCHOTHERAPY SESSIONS Outcome: Not Progressing   Problem: Anger Management  Goal:  Anger Description: Learn and implement coping skills that will result in a reduction of anger and improve daily functioning per pt self report 3 out of 5 sessions documented.   Outcome: Not Progressing   Problem: Anxiety Disorder  Goal:  Anxiety  Description: Reduce overall frequency, intensity and duration of anxiety so that daily functioning is not impaired per pt self report 3 out of 5 sessions.   Outcome: Not Progressing Goal: STG: Patient will participate in at least 80% of scheduled individual psychotherapy sessions Outcome: Not Progressing

## 2022-04-22 ENCOUNTER — Other Ambulatory Visit: Payer: Self-pay | Admitting: Psychiatry

## 2022-04-22 DIAGNOSIS — F3342 Major depressive disorder, recurrent, in full remission: Secondary | ICD-10-CM

## 2022-05-04 ENCOUNTER — Ambulatory Visit (INDEPENDENT_AMBULATORY_CARE_PROVIDER_SITE_OTHER): Payer: Medicare Other | Admitting: Licensed Clinical Social Worker

## 2022-05-04 DIAGNOSIS — F3342 Major depressive disorder, recurrent, in full remission: Secondary | ICD-10-CM | POA: Diagnosis not present

## 2022-05-04 DIAGNOSIS — F4310 Post-traumatic stress disorder, unspecified: Secondary | ICD-10-CM

## 2022-05-04 DIAGNOSIS — F411 Generalized anxiety disorder: Secondary | ICD-10-CM

## 2022-05-04 NOTE — Progress Notes (Signed)
THERAPIST PROGRESS NOTE  Session Time: 2pm-3pm   Participation Level: Active  Behavioral Response: Well GroomedAlertEuthymic  Type of Therapy: Individual Therapy  Treatment Goals addressed: Problem: Anger Management  Goal:  Anger Description: Learn and implement coping skills that will result in a reduction of anger and improve daily functioning per pt self report 3 out of 5 sessions documented.    ProgressTowards Goals: Progressing  Interventions: CBT, Supportive, and Anger Management Training  Summary: Christine Mcneil is a 56 y.o. female who presents with continuing symptoms of anxiety, depression and anger. Pt stated that she has been taking care of her mother and that she gets angry because her sister does not help. Pt stated that she has not said anything to her sister because she does not want to cause a problem. Pt reported that in the past she did not speak to her sister and she doesn't want to do that since they are talking now.   Pt reports that she when she gets angry she goes to her car which is her safe place and she will yell and scream in her car about how she feels. Pt was able to explore in session anger management skills and stated that she would try and use the anger management skills discussed int the session to help with her anger. Pt stated that she has noticed that she takes her anger and frustrations out sometimes on her boyfriend and that she does not want to do that. Pt was able to express that she was going to try and use the deep breathing techniques when she is noticing that she is getting angry and frustrated.   Pt stated that she has let her anger build up and that she does not want to let it build to where she gets upset at her boyfriend. Pt stated that she would try to use the grounding technique dicussed in session 5-4-3-2-1 with her five senses when she is upset the next time to help with distracting herself from the emotion of anger that she is feeling.     Pt denies SI/HI or A/V hallucinations.Pt was cooperative during visit and was engaged throughout the visit. Pt does not report any other concerns at the time of visit.   Suicidal/Homicidal: Nowithout intent/plan  Therapist Response: Dicussed with the pt the importance of healthy coping skills to help with anger. Explored with the pt deep breathing techniques and grounding techniques to include the 5-4-3-2-1 technique to focus on her five senses to help distract herself by focusing on something other than the emotion of anger in that moment.. Allowed pt to explore thoughts and feelings associated with life situations and external stressors. Encouraged expression of feelings and used empathic listening. Pt was oriented to time, place and situation. Engaged with the pt to explore how her thoughts impact her feelings and actions and explored how the pt could cope with her anger in a positive manner. Used CBT techniques and supportive techniques to discuss wit the pt her anger and how it impacts her interactions and feelings.   Continued Recommendations as followed: Self-care behaviors, positive social engagements, focusing on positive physical and emotional wellness, and focusing on life/work balance.    Plan: Return again in 2 weeks.  Diagnosis:  Encounter Diagnoses  Name Primary?   GAD (generalized anxiety disorder) Yes   PTSD (post-traumatic stress disorder)    MDD (major depressive disorder), recurrent, in full remission ([name])      Collaboration of Care: Pt encouraged to  continue care with psychiatrist of record Dr. Shea Evans    Patient/Guardian was advised Release of Information must be obtained prior to any record release in order to collaborate their care with an outside provider. Patient/Guardian was advised if they have not already done so to contact the registration department to sign all necessary forms in order for [name] to release information regarding their care.   Consent:  Patient/Guardian gives verbal consent for treatment and assignment of benefits for services provided during this visit. Patient/Guardian expressed understanding and agreed to proceed.   Christine Mcneil 05/04/2022

## 2022-05-18 ENCOUNTER — Ambulatory Visit (INDEPENDENT_AMBULATORY_CARE_PROVIDER_SITE_OTHER): Payer: Medicare Other | Admitting: Licensed Clinical Social Worker

## 2022-05-18 DIAGNOSIS — F4310 Post-traumatic stress disorder, unspecified: Secondary | ICD-10-CM | POA: Diagnosis not present

## 2022-05-18 DIAGNOSIS — F331 Major depressive disorder, recurrent, moderate: Secondary | ICD-10-CM

## 2022-05-18 DIAGNOSIS — Z634 Disappearance and death of family member: Secondary | ICD-10-CM | POA: Diagnosis not present

## 2022-05-18 DIAGNOSIS — F411 Generalized anxiety disorder: Secondary | ICD-10-CM

## 2022-05-18 NOTE — Progress Notes (Signed)
THERAPIST PROGRESS NOTE  Session Time: 2pm-2:43pm    Participation Level: Active  Behavioral Response: Well GroomedAlertDepressed  Type of Therapy: Individual Therapy  Treatment Goals addressed: Depression Reduce overall frequency, intensity and duration of depression so that daily functioning is not impaired per pt self report 3 out of 5 sessions documented.   ProgressTowards Goals: Not Progressing  Interventions: CBT, Strength-based, and Supportive  Pt presented in person at [facility] office. Pt and LCSW were present during the visit.    Summary: MIKYAH ALAMO is a 56 y.o. female who presents with continuing symptoms of anxiety, depression and anger. Pt stated that she has been taking care of her mother and that she has been overwhelmed and that she has not been taking time for herself and self-care.   Pt was tearful throughout the session stating that she did not feel that she was doing enough for her mother and that she is not getting support from her family.   Allowed pt to explore thoughts and feelings associated with life situations and external stressors. Encouraged expression of feelings and used empathic listening. Pt was oriented to time, place and situation. LCSW validated the pts feelings and thoughts and showed unconditional positive regard.   Pt stated that she used to do crafts and that she would take time for herself and that she has not been interested in any activities. Pt stated that she would try and take a few minutes a day for self-care and that she would utilize the technique of thinking of three good things that happen a day and report back at the next session.   Pt reported that she has been sad and that she worries about her mother and her mothers declining health issues.   Pt denies SI/HI or A/V hallucinations.Pt was cooperative during visit and was engaged throughout the visit. Pt does not report any other concerns at the time of  visit.   Suicidal/Homicidal: Nowithout intent/plan  Therapist Response:Educated the pt on positive coping skills to help with depression. Explored with the pt the negative thinking is a feature of depression and that the positive experiences are minimized as a result. Explored how gratitude can help focus on the positive experiences, and not magnify the negative experiences. Explored with the pt to write about three positive experiences from their day. Engaged the pt to look at all positive experiences and that they can be small. Provided the pt with examples of positive experiences. Explored with the pt why the good experience was meaningful to them, engaged with the pt to explore how they can experience more of this good experience they had. Encouraged the pt to repeat this exercise every day for two weeks to strive to acknowledge positive experiences.    LCSW used strength based techniques to explore with the pt her strengths as a person and caregiver in session and provided unconditional positive regard.   Continued Recommendations as followed: Self-care behaviors, positive social engagements, focusing on positive physical and emotional wellness, and focusing on life/work balance.    Plan: Return again on 06/10/2022.  Diagnosis:  Encounter Diagnoses  Name Primary?   GAD (generalized anxiety disorder) Yes   PTSD (post-traumatic stress disorder)    MDD (major depressive disorder), recurrent episode, moderate ([place])    Bereavement       Collaboration of Care: Pt encouraged to continue care with psychiatrist of record Dr. Shea Evans    Patient/Guardian was advised Release of Information must be obtained prior to  any record release in order to collaborate their care with an outside provider. Patient/Guardian was advised if they have not already done so to contact the registration department to sign all necessary forms in order for [name] to release information regarding their care.   Consent:  Patient/Guardian gives verbal consent for treatment and assignment of benefits for services provided during this visit. Patient/Guardian expressed understanding and agreed to proceed.   Lorenda Hatchet 05/18/2022

## 2022-05-19 ENCOUNTER — Telehealth (INDEPENDENT_AMBULATORY_CARE_PROVIDER_SITE_OTHER): Payer: Self-pay | Admitting: Psychiatry

## 2022-05-19 DIAGNOSIS — F411 Generalized anxiety disorder: Secondary | ICD-10-CM

## 2022-05-19 DIAGNOSIS — F4310 Post-traumatic stress disorder, unspecified: Secondary | ICD-10-CM

## 2022-05-19 NOTE — Progress Notes (Signed)
No response to call or text or video invite  

## 2022-05-21 ENCOUNTER — Inpatient Hospital Stay: Payer: Medicare Other | Attending: Oncology

## 2022-05-21 DIAGNOSIS — D509 Iron deficiency anemia, unspecified: Secondary | ICD-10-CM | POA: Insufficient documentation

## 2022-05-21 LAB — CBC WITH DIFFERENTIAL/PLATELET
Abs Immature Granulocytes: 0.02 10*3/uL (ref 0.00–0.07)
Basophils Absolute: 0.1 10*3/uL (ref 0.0–0.1)
Basophils Relative: 1 %
Eosinophils Absolute: 0.2 10*3/uL (ref 0.0–0.5)
Eosinophils Relative: 2 %
HCT: 41 % (ref 36.0–46.0)
Hemoglobin: 13.9 g/dL (ref 12.0–15.0)
Immature Granulocytes: 0 %
Lymphocytes Relative: 41 %
Lymphs Abs: 3.7 10*3/uL (ref 0.7–4.0)
MCH: 29.4 pg (ref 26.0–34.0)
MCHC: 33.9 g/dL (ref 30.0–36.0)
MCV: 86.7 fL (ref 80.0–100.0)
Monocytes Absolute: 0.6 10*3/uL (ref 0.1–1.0)
Monocytes Relative: 7 %
Neutro Abs: 4.6 10*3/uL (ref 1.7–7.7)
Neutrophils Relative %: 49 %
Platelets: 221 10*3/uL (ref 150–400)
RBC: 4.73 MIL/uL (ref 3.87–5.11)
RDW: 12.5 % (ref 11.5–15.5)
WBC: 9.1 10*3/uL (ref 4.0–10.5)
nRBC: 0 % (ref 0.0–0.2)

## 2022-05-21 LAB — IRON AND TIBC
Iron: 96 ug/dL (ref 28–170)
Saturation Ratios: 20 % (ref 10.4–31.8)
TIBC: 480 ug/dL — ABNORMAL HIGH (ref 250–450)
UIBC: 384 ug/dL

## 2022-05-21 LAB — FERRITIN: Ferritin: 134 ng/mL (ref 11–307)

## 2022-06-03 ENCOUNTER — Encounter: Payer: Self-pay | Admitting: Oncology

## 2022-06-10 ENCOUNTER — Ambulatory Visit (INDEPENDENT_AMBULATORY_CARE_PROVIDER_SITE_OTHER): Payer: 59 | Admitting: Licensed Clinical Social Worker

## 2022-06-10 DIAGNOSIS — F4310 Post-traumatic stress disorder, unspecified: Secondary | ICD-10-CM | POA: Diagnosis not present

## 2022-06-10 DIAGNOSIS — Z634 Disappearance and death of family member: Secondary | ICD-10-CM

## 2022-06-10 DIAGNOSIS — F331 Major depressive disorder, recurrent, moderate: Secondary | ICD-10-CM

## 2022-06-10 DIAGNOSIS — F411 Generalized anxiety disorder: Secondary | ICD-10-CM

## 2022-06-10 NOTE — Progress Notes (Addendum)
THERAPIST PROGRESS NOTE  Session Time: 2:03PM-2:41PM    Participation Level: Active  Behavioral Response: Well GroomedAlertEuthymic  Type of Therapy: Individual Therapy  Treatment Goals addressed: Depression Reduce overall frequency, intensity and duration of depression so that daily functioning is not impaired per pt self report 3 out of 5 sessions documented.   Intervention: Will work with pt to learn and implement calming skills to reduce overall depression and manage depression symptoms. Some of the techniques that will be used during session will be deep breathing exercises, visual imagery, progressive muscle relaxation, postreduction relative to the benefits of self-care and other breathing exercises.    ProgressTowards Goals: Progressing  Interventions: CBT, Strength-based, and Supportive  Pt presented in person at [facility] office. Pt and LCSW were present during the visit.    Summary: Christine Mcneil is a 57 y.o. female who presents with continuing symptoms of anxiety, depression and anger. Pt stated that she has been taking care of her mother and that she has been doing overall "good". Pt stated that her mood has been better and that she not been as angry and that she been able to "let go" of the things that she can not control.   Pt stated that overall she has been doing well and that she has been able to let go of things that she is not in control of. Pt reported that she has been able to spend time with her mother and that her mother has been supportive and appreciative of her helping her.   Allowed pt to explore thoughts and feelings associated with life situations and external stressors. Encouraged expression of feelings and used empathic listening. Pt was oriented to time, place and situation. LCSW validated the pts feelings and thoughts and showed unconditional positive regard. LCSW provided mood monitoring and treatment progress review in the  context of this episode of treatment. LCSW reviewed the pt's mood status since last session.    Pt stated that she has been able to use the technique of thinking of three good things and that it has been helpful in helping her stay positive. Pt was able to explore how deep breathing could be helpful and has been helpful in the past.   Discussed with the pt the transition of a new therapist and provided the pt with the choice of continuing services with the new therapist and answered any questions that the pt had about the transition.  Pt agreed to the transition to the new therapist.   Pt denies SI/HI or A/V hallucinations.Pt was cooperative during visit and was engaged throughout the visit. Pt does not report any other concerns at the time of visit.   Encouraged pt to take medications as prescribed by their psychiatrist.    Suicidal/Homicidal: Nowithout intent/plan  Therapist Response:Educated on the importance of healthy coping skills with the pt. Explored the benefits of healthy coping skills and engaged the pt to discuss ways that they are coping with anxiety and depression. Encouraged the pt to explore new ways to help alleviate anxiety and determined new ways to help with distraction and coping in session. Discussed the benefits of exercise as a way of coping with anxiety and stress. Explored different ways that the pt could cope to include listening to music, being in nature, trying a new hobby and other activities that the pt could try to help with coping.    Educated on mindful deep breathing with the pt. Explored how the technique could help  alleviate anxiety. Engaged with the pt to inhale for four seconds, then hold their air in their lungs for four seconds and then slowly exhale for six seconds. Encouraged the pt to practice the mindful deep breathing at home or any place where they feel that they need to take a few minutes to focus on breathing and being in the moment. Discussed with the  pt how the technique could be effective in helping with anxiety and is a discreet way to help them stay grounded.   LCSW used CBT techniques to explore how thoughts,feelings and beliefs impact behavioral responses and physical symptoms. Explored how stressors can lead to negative thoughts that can be irrational or exaggerated which in return impacts how the person feels and can negatively impact how their body responds to include fatigue, sleep problems, poor concentration and loss of motivation. Explored how a behavioral response can create new stressors. Engaged with the pt to explore how their thoughts and feelings impact their behaviors and choices.  LCSW used strength based techniques to explore with the pt her strengths as a person and caregiver in session and provided unconditional positive regard.   Continued Recommendations as followed: Self-care behaviors, positive social engagements, focusing on positive physical and emotional wellness, and focusing on life/work balance.    Plan: Return again on 06/23/2022.   Diagnosis:  Encounter Diagnoses  Name Primary?   GAD (generalized anxiety disorder) Yes   PTSD (post-traumatic stress disorder)    MDD (major depressive disorder), recurrent episode, moderate ([place])    Bereavement       Collaboration of Care: Pt encouraged to continue care with psychiatrist of record Dr. Shea Evans    Patient/Guardian was advised Release of Information must be obtained prior to any record release in order to collaborate their care with an outside provider. Patient/Guardian was advised if they have not already done so to contact the registration department to sign all necessary forms in order for [name] to release information regarding their care.   Consent: Patient/Guardian gives verbal consent for treatment and assignment of benefits for services provided during this visit. Patient/Guardian expressed understanding and agreed to proceed.   Lorenda Hatchet 06/10/2022

## 2022-06-17 ENCOUNTER — Other Ambulatory Visit: Payer: Self-pay | Admitting: Psychiatry

## 2022-06-17 DIAGNOSIS — G4701 Insomnia due to medical condition: Secondary | ICD-10-CM

## 2022-06-22 ENCOUNTER — Telehealth (INDEPENDENT_AMBULATORY_CARE_PROVIDER_SITE_OTHER): Payer: 59 | Admitting: Psychiatry

## 2022-06-22 ENCOUNTER — Encounter: Payer: Self-pay | Admitting: Psychiatry

## 2022-06-22 DIAGNOSIS — F4310 Post-traumatic stress disorder, unspecified: Secondary | ICD-10-CM

## 2022-06-22 DIAGNOSIS — F330 Major depressive disorder, recurrent, mild: Secondary | ICD-10-CM | POA: Diagnosis not present

## 2022-06-22 DIAGNOSIS — F411 Generalized anxiety disorder: Secondary | ICD-10-CM

## 2022-06-22 DIAGNOSIS — G4701 Insomnia due to medical condition: Secondary | ICD-10-CM

## 2022-06-22 DIAGNOSIS — F3342 Major depressive disorder, recurrent, in full remission: Secondary | ICD-10-CM

## 2022-06-22 DIAGNOSIS — Z634 Disappearance and death of family member: Secondary | ICD-10-CM

## 2022-06-22 DIAGNOSIS — R52 Pain, unspecified: Secondary | ICD-10-CM

## 2022-06-22 MED ORDER — ARIPIPRAZOLE 2 MG PO TABS: 1.0000 mg | ORAL_TABLET | Freq: Every day | ORAL | 0 refills | Status: DC

## 2022-06-22 MED ORDER — HYDROXYZINE PAMOATE 25 MG PO CAPS: ORAL_CAPSULE | ORAL | 0 refills | Status: DC

## 2022-06-22 MED ORDER — GABAPENTIN 600 MG PO TABS: 600.0000 mg | ORAL_TABLET | Freq: Every day | ORAL | 0 refills | Status: DC

## 2022-06-22 MED ORDER — DULOXETINE HCL 60 MG PO CPEP: 60.0000 mg | ORAL_CAPSULE | Freq: Two times a day (BID) | ORAL | 0 refills | Status: DC

## 2022-06-22 NOTE — Progress Notes (Signed)
Virtual Visit via Video Note  I connected with Christine Mcneil on 06/22/22 at  8:30 AM EST by a video enabled telemedicine application and verified that I am speaking with the correct person using two identifiers.  Location Provider Location : [facility] Patient Location : Home  Participants: Patient , Provider    I discussed the limitations of evaluation and management by telemedicine and the availability of in person appointments. The patient expressed understanding and agreed to proceed.  I discussed the assessment and treatment plan with the patient. The patient was provided an opportunity to ask questions and all were answered. The patient agreed with the plan and demonstrated an understanding of the instructions.   The patient was advised to call back or seek an in-person evaluation if the symptoms worsen or if the condition fails to improve as anticipated.   [place] MD OP Progress Note  06/22/2022 9:17 AM JONICA BICKHART  MRN:  [id number]  Chief Complaint:  Chief Complaint  Patient presents with   Follow-up   Medication Refill   Anxiety   Depression   HPI: Christine Mcneil is a 57 year old Caucasian female, divorced, lives in [place], has a history of MDD, PTSD, insomnia, OSA, history of CVA, history of prothrombin gene mutation, chronic pain, migraine headaches, vitamin B-12 deficiency, iron deficiency was evaluated by telemedicine today.  Patient today reports she is currently feeling better with her headaches.  She was recently started on zonisamide which has been beneficial for headaches.  However she feels that medication is likely making her more depressed.  She however is not sure if it is the medication side effect or not since she also has other psychosocial stressors.  She is currently taking care of her mother who has lots of medical needs and that has been stressful for her as well.  Patient reports she has been taking only 1 dose of gabapentin since the higher dosage made her drowsy.   She has been compliant on all her other medications.  Patient reports sleep is overall good on the Belsomra.  Patient denies any suicidality, homicidality or perceptual disturbances.  Continues to follow-up with her therapist Ms. Verl Blalock.  Reports she has upcoming appointment tomorrow.  Looks forward to that.  Patient denies any other concerns today.  Visit Diagnosis:    ICD-10-CM   1. MDD (major depressive disorder), recurrent episode, mild (HCC)  F33.0 ARIPiprazole (ABILIFY) 2 MG tablet    2. PTSD (post-traumatic stress disorder)  F43.10 gabapentin (NEURONTIN) 600 MG tablet    3. GAD (generalized anxiety disorder)  F41.1 hydrOXYzine (VISTARIL) 25 MG capsule    gabapentin (NEURONTIN) 600 MG tablet    4. Insomnia due to medical condition  G47.01    pain    5. Bereavement  Z63.4 DULoxetine (CYMBALTA) 60 MG capsule      Past Psychiatric History: Reviewed past psychiatric history from progress note on 08/16/2017.  Past trials of Effexor, Klonopin.  Past Medical History:  Past Medical History:  Diagnosis Date   Abdominal pain    Anxiety and depression    Atypical facial pain    Bilateral occipital neuralgia    Cervical spondylosis without myelopathy    Chronic daily headache    Chronic pain    Chronic pelvic pain in female    Chronic thoracic back pain    Depression    Diabetes mellitus without complication ([place])    Diabetes mellitus, type II (HCC)    Dysphagia    Dysrhythmia  Fibromyalgia    Hyperlipidemia    Hypertension    Hypothyroidism    Insomnia    Iron deficiency anemia    Left hip pain    Low back pain    Migraines    Numbness    Optic neuropathy    OSA (obstructive sleep apnea)    Polyarthralgia    Primary osteoarthritis of both hips    Prothrombin gene mutation (HCC)    Pseudotumor cerebri    Rectal bleeding    Right shoulder pain    Rotator cuff syndrome    Stroke [facility])    Thyroid disease    TIA (transient ischemic attack) 05/27/2017     Past Surgical History:  Procedure Laterality Date   ABDOMINAL HYSTERECTOMY     total   CHOLECYSTECTOMY     COLONOSCOPY WITH PROPOFOL N/A 03/21/2018   Procedure: COLONOSCOPY WITH PROPOFOL;  Surgeon: Lucilla Lame, MD;  Location: [facility] ENDOSCOPY;  Service: Endoscopy;  Laterality: N/A;   ERCP N/A 08/19/2020   Procedure: ENDOSCOPIC RETROGRADE CHOLANGIOPANCREATOGRAPHY (ERCP);  Surgeon: Lucilla Lame, MD;  Location: [facility] ENDOSCOPY;  Service: Endoscopy;  Laterality: N/A;   ESOPHAGOGASTRODUODENOSCOPY (EGD) WITH PROPOFOL N/A 03/21/2018   Procedure: ESOPHAGOGASTRODUODENOSCOPY (EGD) WITH PROPOFOL;  Surgeon: Lucilla Lame, MD;  Location: [facility] ENDOSCOPY;  Service: Endoscopy;  Laterality: N/A;   GIVENS CAPSULE STUDY N/A 09/24/2019   Procedure: GIVENS CAPSULE STUDY;  Surgeon: Virgel Manifold, MD;  Location: [facility] ENDOSCOPY;  Service: Endoscopy;  Laterality: N/A;   KNEE SURGERY Left    LEFT HEART CATH AND CORONARY ANGIOGRAPHY N/A 03/10/2022   Procedure: LEFT HEART CATH AND CORONARY ANGIOGRAPHY;  Surgeon: Yolonda Kida, MD;  Location: [facility] CV LAB;  Service: Cardiovascular;  Laterality: N/A;   ROTATOR CUFF REPAIR Right    spinal neurostimulator      Family Psychiatric History: Reviewed family psychiatric history from progress note on 08/16/2017.  Family History:  Family History  Problem Relation Age of Onset   Diabetes Mother    Hyperlipidemia Mother    Hypertension Mother    COPD Mother    CVA Mother    Anemia Mother    Diabetes Father    Hyperlipidemia Father    Hypertension Father    Thyroid disease Father    Alcohol abuse Father    Peripheral vascular disease Sister    Hypertension Sister    Anxiety disorder Son    Depression Son    Bipolar disorder Son    Seizures Son     Social History: Reviewed social history from progress note on 08/16/2017. Social History   Socioeconomic History   Marital status: Divorced    Spouse name: Not on file   Number of children: 1    Years of education: Not on file   Highest education level: High school graduate  Occupational History    Comment: disablitiy  Tobacco Use   Smoking status: Never   Smokeless tobacco: Never  Vaping Use   Vaping Use: Never used  Substance and Sexual Activity   Alcohol use: No   Drug use: No   Sexual activity: Not Currently  Other Topics Concern   Not on file  Social History Narrative   Not on file   Social Determinants of Health   Financial Resource Strain: Low Risk  (07/19/2017)   Overall Financial Resource Strain (CARDIA)    Difficulty of Paying Living Expenses: Not hard at all  Food Insecurity: No Food Insecurity (07/19/2017)   Hunger Vital Sign  Worried About Charity fundraiser in the Last Year: Never true    [place] in the Last Year: Never true  Transportation Needs: No Transportation Needs (07/19/2017)   PRAPARE - Hydrologist (Medical): No    Lack of Transportation (Non-Medical): No  Physical Activity: Inactive (07/19/2017)   Exercise Vital Sign    Days of Exercise per Week: 0 days    Minutes of Exercise per Session: 0 min  Stress: Stress Concern Present (07/19/2017)   [place]    Feeling of Stress : Very much  Social Connections: Moderately Isolated (07/19/2017)   Social Connection and Isolation Panel [NHANES]    Frequency of Communication with Friends and Family: Once a week    Frequency of Social Gatherings with Friends and Family: Never    Attends Religious Services: More than 4 times per year    Active Member of [employer] or Organizations: No    Attends Archivist Meetings: Never    Marital Status: Divorced    Allergies:  Allergies  Allergen Reactions   Amoxapine Itching   Amoxicillin Itching   Dapagliflozin Itching    Other reaction(s): Other (See Comments) Thrush & vaginal itching   Keflex [Cephalexin] Itching   Mirtazapine Other (See  Comments)    Pt states felling "like my body is outside of me."    Metabolic Disorder Labs: Lab Results  Component Value Date   HGBA1C 9.9 (H) 06/21/2017   MPG 237.43 06/21/2017   Lab Results  Component Value Date   PROLACTIN 4.3 (L) 05/26/2021   PROLACTIN 5.5 09/06/2017   Lab Results  Component Value Date   CHOL 157 05/26/2021   TRIG 245 (H) 05/26/2021   HDL 32 (L) 05/26/2021   CHOLHDL 4.9 (H) 05/26/2021   VLDL 39 06/21/2017   LDLCALC 84 05/26/2021   LDLCALC 174 (H) 06/21/2017   Lab Results  Component Value Date   TSH 2.890 09/06/2017   TSH 3.176 01/04/2017    Therapeutic Level Labs: No results found for: "LITHIUM" No results found for: "VALPROATE" No results found for: "CBMZ"  Current Medications: Current Outpatient Medications  Medication Sig Dispense Refill   ACCU-CHEK AVIVA PLUS test strip      ACCU-CHEK SOFTCLIX LANCETS lancets      Acetaminophen (TYLENOL ARTHRITIS EXT RELIEF PO) Take 600 mg by mouth daily.     albuterol (VENTOLIN HFA) 108 (90 Base) MCG/ACT inhaler Inhale 2 puffs into the lungs every 6 (six) hours as needed for wheezing or shortness of breath. 1 g 0   atorvastatin (LIPITOR) 80 MG tablet Take 80 mg by mouth daily at 6 PM.     BELSOMRA 15 MG TABS TAKE 15 MG BY MOUTH AT BEDTIME. 30 tablet 1   Blood Glucose Monitoring Suppl (ACCU-CHEK AVIVA PLUS) w/Device KIT      Carboxymethylcellulose Sod PF 0.5 % SOLN Apply 1 drop to eye daily as needed.     Cholecalciferol (VITAMIN D3) 1000 units CAPS Take 1 capsule by mouth daily.     clobetasol ointment (TEMOVATE) 0.05 % Apply topically.     clotrimazole (LOTRIMIN) 1 % cream Apply 1 application topically daily as needed.     Continuous Blood Gluc Receiver (DEXCOM G6 RECEIVER) DEVI Use to monitor blood sugar. [place] [phone]     Continuous Blood Gluc Sensor (DEXCOM G6 SENSOR) MISC Use to monitor blood sugar.  Replace every 10 days. [place] [phone].  Continuous Blood Gluc Transmit (DEXCOM G6  TRANSMITTER) MISC Use to monitor blood sugar.  Replace every 3 months. [place] [phone].     cyclobenzaprine (FLEXERIL) 5 MG tablet Take 1 tablet by mouth 3 (three) times daily as needed.     dabigatran (PRADAXA) 150 MG CAPS capsule Take 150 mg by mouth 2 (two) times daily.     diltiazem (CARDIZEM CD) 240 MG 24 hr capsule Take by mouth.     esomeprazole (NEXIUM) 40 MG capsule Take 40 mg by mouth daily.     ezetimibe (ZETIA) 10 MG tablet Take 10 mg by mouth at bedtime.     famotidine (PEPCID) 40 MG tablet Take 40 mg by mouth at bedtime.     ferrous sulfate 325 (65 FE) MG tablet Take by mouth. Take 325 mg daily with breakfast     fluticasone (FLONASE) 50 MCG/ACT nasal spray Place 2 sprays into both nostrils as needed.      furosemide (LASIX) 40 MG tablet TAKE (1) TABLET BY MOUTH EVERY DAY     insulin degludec (TRESIBA FLEXTOUCH) 200 UNIT/ML FlexTouch Pen Inject into the skin.     Insulin Lispro w/ Trans Port 100 UNIT/ML SOPN INJECT UP TO 50 UNITS DAILY DIVIDED DOSES, AS DIRECTED     Insulin Pen Needle (TRUEPLUS PEN NEEDLES) 29G X 12MM MISC      levothyroxine (SYNTHROID, LEVOTHROID) 100 MCG tablet Take 100 mcg by mouth daily.     magnesium oxide (MAG-OX) 400 MG tablet Take 1 tablet by mouth daily.     Melatonin 10 MG TABS Take 2 tablets by mouth at bedtime.     meloxicam (MOBIC) 15 MG tablet Take 15 mg by mouth daily.     metFORMIN (GLUCOPHAGE-XR) 500 MG 24 hr tablet Take 500 mg by mouth 2 (two) times daily.      metoprolol succinate (TOPROL-XL) 25 MG 24 hr tablet Take 1 tablet by mouth daily.     mometasone (ELOCON) 0.1 % cream Apply topically.     naloxone (NARCAN) nasal spray 4 mg/0.1 mL Place into the nose.     nystatin (MYCOSTATIN) 100000 UNIT/ML suspension Take 5 mLs by mouth 4 (four) times daily.     omeprazole (PRILOSEC) 20 MG capsule TAKE (1) CAPSULE BY MOUTH EVERY DAY 30 capsule 0   ondansetron (ZOFRAN) 4 MG tablet Take 4 mg by mouth every 8 (eight) hours as needed.     pioglitazone  (ACTOS) 15 MG tablet Take by mouth. Take 1 tablet by mouth once daily     Riboflavin 400 MG TABS Take 1 tablet by mouth every night  for headaches     RYBELSUS 7 MG TABS Take 1 tablet by mouth daily.     TOUJEO SOLOSTAR 300 UNIT/ML Solostar Pen SMARTSIG:76 Unit(s) SUB-Q Every Night     vitamin B-12 (CYANOCOBALAMIN) 100 MCG tablet Take 1 tablet (100 mcg total) by mouth daily. 90 tablet 1   XTAMPZA ER 9 MG C12A PLEASE SEE ATTACHED FOR DETAILED DIRECTIONS     ARIPiprazole (ABILIFY) 2 MG tablet Take 0.5 tablets (1 mg total) by mouth daily. 45 tablet 0   DULoxetine (CYMBALTA) 60 MG capsule Take 1 capsule (60 mg total) by mouth 2 (two) times daily. 180 capsule 0   gabapentin (NEURONTIN) 600 MG tablet Take 1 tablet (600 mg total) by mouth daily. Dose change 90 tablet 0   hydrOXYzine (VISTARIL) 25 MG capsule TAKE 1 CAPSULE BY MOUTH TWICE DAILY AS NEEDED. FOR ANXIETY 180 capsule 0  nitroGLYCERIN (NITROSTAT) 0.4 MG SL tablet Place under the tongue.     No current facility-administered medications for this visit.   Facility-Administered Medications Ordered in Other Visits  Medication Dose Route Frequency Provider Last Rate Last Admin   iohexol (OMNIPAQUE) 300 MG/ML solution    PRN [place], Dwayne D, MD   60 mL at 03/10/22 1442     Musculoskeletal: Strength & Muscle Tone:  UTA Gait & Station:  Seated Patient leans: N/A  Psychiatric Specialty Exam: Review of Systems  Psychiatric/Behavioral:  Positive for dysphoric mood. The patient is nervous/anxious.   All other systems reviewed and are negative.   There were no vitals taken for this visit.There is no height or weight on file to calculate BMI.  General Appearance: Casual  Eye Contact:  Fair  Speech:  Clear and Coherent  Volume:  Normal  Mood:  Anxious and Depressed  Affect:  Congruent  Thought Process:  Goal Directed and Descriptions of Associations: Intact  Orientation:  Full (Time, Place, and Person)  Thought Content: Logical    Suicidal Thoughts:  No  Homicidal Thoughts:  No  Memory:  Immediate;   Fair Recent;   Fair Remote;   Fair  Judgement:  Fair  Insight:  Fair  Psychomotor Activity:  Normal  Concentration:  Concentration: Fair and Attention Span: Fair  Recall:  [employer] of Knowledge: Fair  Language: Fair  Akathisia:  No  Handed:  Right  AIMS (if indicated): not done  Assets:  Communication Skills Desire for Improvement Housing Social Support  ADL's:  Intact  Cognition: WNL  Sleep:  Fair   Screenings: AIMS    Flowsheet Row Video Visit from 02/04/2022 in [place] Office Visit from 12/30/2021 in [place] Office Visit from 11/18/2021 in [place] Video Visit from 09/03/2021 in [place] Total Score 0 0 0 3      GAD-7    Flowsheet Row Counselor from 05/18/2022 in [place] Counselor from 05/04/2022 in [place] Office Visit from 04/20/2022 in [place] Video Visit from 03/29/2022 in [place] Office Visit from 12/30/2021 in [place]  Total GAD-7 Score '18 16 19 4 1      '$ PHQ2-9    Flowsheet Row Counselor from 06/10/2022 in [place] Counselor from 05/18/2022 in [place] Office Visit from 04/20/2022 in [place] Office Visit from 12/30/2021 in [place] Office Visit from 11/18/2021 in [place]  PHQ-2 Total Score '5 5 5 1 2  '$ PHQ-9 Total Score '16 12 15 7 5      '$ Flowsheet Row Video Visit from 06/22/2022 in  [place] Counselor from 06/10/2022 in [place] Counselor from 05/18/2022 in [place] CATEGORY Low Risk No Risk No Risk        Assessment and Plan: Christine Mcneil is a 57 year old Caucasian female who has a history of MDD, PTSD, chronic pain, migraine headaches, history of CVA, iron deficiency, multiple other medical problems was evaluated by telemedicine today.  Patient with recent worsening of mood symptoms likely due to multiple psychosocial stressors unknown if  contributed by recent changes to her medication regimen by neurology for headaches, currently on zonisamide.  Patient to monitor closely.  Plan as noted below.  Plan MDD-unstable Cymbalta 120 mg p.o. daily in divided dosage Gabapentin 600 mg p.o. daily.  Patient reports she has been taking only once a day dosage since the twice a day dosage made her drowsy. Abilify 1 mg p.o. daily.  Provided education about drug to drug interaction between Abilify and zonisamide.  Discussed side effects including heat intolerance, risk of heat stroke, decreased sweating when compliant with this medication. Patient to continue CBT , has an appointment tomorrow with her therapist.  GAD-unstable Cymbalta and gabapentin as prescribed as noted above. Patient to have more frequent CBT sessions.  She will discuss with her therapist, Ms. Verl Blalock. Hydroxyzine 25 mg p.o. twice daily as needed for severe anxiety.  PTSD-improving Continue CBT.  Insomnia-improving Belsomra 15 mg p.o. nightly Reviewed Strong PMP AWARxE  Bereavement-improving Will monitor closely and continue counseling.   Patient to monitor her symptoms closely, and noncurrent depression and anxiety symptoms due to situational stressors versus side effect to zonisamide which was recently added.  Patient to reach out as needed for medication  management if needed.  Patient to continue CBT, has upcoming appointment tomorrow.   Collaboration of Care: Collaboration of Care: Referral or follow-up with counselor/therapist AEB encouraged to follow up with therapist.  Patient/Guardian was advised Release of Information must be obtained prior to any record release in order to collaborate their care with an outside provider. Patient/Guardian was advised if they have not already done so to contact the registration department to sign all necessary forms in order for [name] to release information regarding their care.   Consent: Patient/Guardian gives verbal consent for treatment and assignment of benefits for services provided during this visit. Patient/Guardian expressed understanding and agreed to proceed.   This note was generated in part or whole with voice recognition software. Voice recognition is usually quite accurate but there are transcription errors that can and very often do occur. I apologize for any typographical errors that were not detected and corrected.      Ursula Alert, MD 06/22/2022, 9:17 AM

## 2022-06-23 ENCOUNTER — Ambulatory Visit (INDEPENDENT_AMBULATORY_CARE_PROVIDER_SITE_OTHER): Payer: 59 | Admitting: Licensed Clinical Social Worker

## 2022-06-23 DIAGNOSIS — F411 Generalized anxiety disorder: Secondary | ICD-10-CM

## 2022-06-23 DIAGNOSIS — F330 Major depressive disorder, recurrent, mild: Secondary | ICD-10-CM

## 2022-06-23 DIAGNOSIS — F4310 Post-traumatic stress disorder, unspecified: Secondary | ICD-10-CM

## 2022-06-23 NOTE — Progress Notes (Signed)
THERAPIST PROGRESS NOTE  Session Time: 2:00PM-2:40PM    Participation Level: Active  Behavioral Response: Well GroomedAlertEuthymic  Type of Therapy: Individual Therapy  Treatment Goals addressed: Depression Reduce overall frequency, intensity and duration of depression so that daily functioning is not impaired per pt self report 3 out of 5 sessions documented.   Intervention: Will work with pt to learn and implement calming skills to reduce overall depression and manage depression symptoms. Some of the techniques that will be used during session will be deep breathing exercises, visual imagery, progressive muscle relaxation, postreduction relative to the benefits of self-care and other breathing exercises.    PHQ-9 score on 06/10/2022 was a 16 and on 06/23/2022 PHQ-9 score was a 4.   ProgressTowards Goals: Progressing  Interventions: CBT, DBT, and Supportive  Pt presented in person at [facility] office for a follow-up visit. Pt and LCSW were present during the visit.    Summary: Christine Mcneil is a 57 y.o. female who presents with continuing symptoms of anxiety, depression and anger. Pt lives with her boyfriend in [place].Pt stated that she has been taking care of her mother and that she has been having to help her mother more often since the last visit. Pt stated that her mood has been "good" most days and stated that she has not been getting along with her boyfriend like she wants to. Pt reported that she feels that they are distant and that it is causing her distress. Pt was able to explore in sessions ways that she could communicate her needs to her boyfriend.   LCSW provided mood monitoring and treatment progress review in the context of this episode of treatment. LCSW reviewed the pt's mood status since last session.  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 Pt stated that she wants to have more social support from her boyfriend and her sister. Pt stated that she plan on having a conversation with her sister about helping with their mother. Pt was able to explore in session what she could say to her sister and how it made her feel.   Allowed pt to explore thoughts and feelings associated with life situations and external stressors. Encouraged expression of feelings and used empathic listening. Pt was oriented to time, place and situation. LCSW validated the pts feelings and thoughts and showed unconditional positive regard.   Pt was able to explore ways that she could be present in the moment and was able to explore ways that she could practice mindfulness exercises.   Pt denies SI/HI or A/V hallucinations.Pt was cooperative during visit and was engaged throughout the visit. Pt does not report any other concerns at the time of visit.   Encouraged pt to take medications as prescribed by their psychiatrist.    Continued Recommendations as followed: Self-care behaviors, positive social engagements, focusing on positive physical and emotional wellness, and focusing on life/work balance.     Pt is continuing to apply interventions/techniques learned in session into daily life situations. Pt is currently on track to meet goals utilizing interventions that are discussed in session. Treatment to continue as indicated. Personal growth and progress toward goals noted above.   Suicidal/Homicidal: Nowithout intent/plan  Therapist Response:Explored with pt how one important way to regain emotional health is to develop and utilize healthy social supports. Engaged the pt to explore  who their support system is to include any family members, friends, other resources or social groups that they are part of feel and important to. Explored that the support must be someone or a group that they trust and affirm their individuality. Encouraged the pt to explore any  barriers that stand in the way in developing their support system to include: difficult time reaching out to others, low self-esteem, hard time making and keeping friends and any other barriers that prevent them from having social supports. Explored what the pt feels that they need and want from their support system and empowered the pt to commit themselves to develop a support sytem and to not give up on developing and utilizing their healthy support system. Explored with the pt how it makes them feel to be supportive and looked at how they can look for new supports and resources to help them with providing positive support.    Encouraged the pt to participate in activities in which they will experience success and achievement. Explored with the pt distraction as a coping mechanism and explored cognitive distractions, behavioral distraction and physiological distractions. Provided pt with examples of each form of distractions and explored with pt new ways that they could implement the distractions into their daily routine when they face difficult emotions and stressors.    Discussed with the pt appropriate communication and explored with the pt the use of "I" statements. Explored how when a person feels that they are being blamed even if they are right or wrong that a common response is defensiveness. By using "I" statements it allows a person to reduce the feelings of blame and take responsibility for ones' own feelings while being tactful in describing their feelings and the situation. Encouraged the pt to explore how the "I feel" statements must be followed with an emotion word and that when explaining the situation to someone else it is important to gently describe how the other persons actions and choices are affecting them. Explored in session with the pt how they can use effective communication and use reflection to help them improve effective healthy communication with others.    LCSW used CBT  techniques to explore how thoughts,feelings and beliefs impact behavioral responses and physical symptoms. Explored how stressors can lead to negative thoughts that can be irrational or exaggerated which in return impacts how the person feels and can negatively impact how their body responds to include fatigue, sleep problems, poor concentration and loss of motivation. Explored how a behavioral response can create new stressors. Engaged with the pt to explore how their thoughts and feelings impact their behaviors and choices.  LCSW used strength based techniques to explore with the pt her strengths as a person and caregiver in session and provided unconditional positive regard.    Plan: Discussed with the pt the transition of a new therapist and provided the pt with the choice of continuing services with the new therapist and answered any questions that the pt had about the transition.  Pt agreed to the transition to the new therapist.   Diagnosis:  Encounter Diagnoses  Name Primary?   MDD (major depressive disorder), recurrent episode, mild (HCC) Yes   PTSD (post-traumatic stress disorder)    GAD (generalized anxiety disorder)       Collaboration of Care: Pt encouraged to continue care with psychiatrist of record Dr. Shea Evans    Patient/Guardian was advised Release of Information must be obtained prior to any record release in order to collaborate their  care with an outside provider. Patient/Guardian was advised if they have not already done so to contact the registration department to sign all necessary forms in order for [name] to release information regarding their care.   Consent: Patient/Guardian gives verbal consent for treatment and assignment of benefits for services provided during this visit. Patient/Guardian expressed understanding and agreed to proceed.   Lorenda Hatchet 06/23/2022

## 2022-06-30 ENCOUNTER — Encounter: Payer: Self-pay | Admitting: Oncology

## 2022-06-30 ENCOUNTER — Ambulatory Visit
Admission: RE | Admit: 2022-06-30 | Discharge: 2022-06-30 | Disposition: A | Discharge status: Home / Self Care | Payer: 59 | Source: Ambulatory Visit | Attending: Family Medicine | Admitting: Family Medicine

## 2022-06-30 ENCOUNTER — Other Ambulatory Visit: Payer: Self-pay | Admitting: Family Medicine

## 2022-06-30 DIAGNOSIS — R062 Wheezing: Secondary | ICD-10-CM

## 2022-06-30 DIAGNOSIS — R059 Cough, unspecified: Secondary | ICD-10-CM

## 2022-07-09 ENCOUNTER — Telehealth (INDEPENDENT_AMBULATORY_CARE_PROVIDER_SITE_OTHER): Payer: 59 | Admitting: Psychiatry

## 2022-07-09 ENCOUNTER — Encounter: Payer: Self-pay | Admitting: Psychiatry

## 2022-07-09 DIAGNOSIS — F411 Generalized anxiety disorder: Secondary | ICD-10-CM

## 2022-07-09 DIAGNOSIS — F4310 Post-traumatic stress disorder, unspecified: Secondary | ICD-10-CM | POA: Diagnosis not present

## 2022-07-09 DIAGNOSIS — F330 Major depressive disorder, recurrent, mild: Secondary | ICD-10-CM | POA: Diagnosis not present

## 2022-07-09 DIAGNOSIS — G4701 Insomnia due to medical condition: Secondary | ICD-10-CM

## 2022-07-09 DIAGNOSIS — Z634 Disappearance and death of family member: Secondary | ICD-10-CM

## 2022-07-09 MED ORDER — BELSOMRA 20 MG PO TABS: 20.0000 mg | ORAL_TABLET | Freq: Every day | ORAL | 1 refills | Status: DC

## 2022-07-09 MED ORDER — HYDROXYZINE PAMOATE 25 MG PO CAPS: 25.0000 mg | ORAL_CAPSULE | Freq: Two times a day (BID) | ORAL | 1 refills | Status: DC | PRN

## 2022-07-09 NOTE — Progress Notes (Signed)
Virtual Visit via Video Note  I connected with Christine Mcneil on 07/09/22 at 10:30 AM EST by a video enabled telemedicine application and verified that I am speaking with the correct person using two identifiers.  Location Provider Location : Remote office Patient Location : Home  Participants: Patient , Provider    I discussed the limitations of evaluation and management by telemedicine and the availability of in person appointments. The patient expressed understanding and agreed to proceed.   I discussed the assessment and treatment plan with the patient. The patient was provided an opportunity to ask questions and all were answered. The patient agreed with the plan and demonstrated an understanding of the instructions.   The patient was advised to call back or seek an in-person evaluation if the symptoms worsen or if the condition fails to improve as anticipated.   [facility] Christine Mcneil OP Progress Note  07/09/2022 12:38 PM Christine Mcneil  MRN:  [id number]  Chief Complaint:  Chief Complaint  Patient presents with   Follow-up   Anxiety   Depression   Medication Refill   HPI: Christine Mcneil is a 57 year old Caucasian female, divorced, lives in [place], has a history of MDD, PTSD, insomnia, OSA, history of CVA, history of prothrombin gene mutation, chronic pain, migraine headaches, vitamin B12 deficiency, iron deficiency was evaluated by telemedicine today.  Patient today reports she continues to have multiple situational stressors.  She reports she has a lot of racing thoughts at night about things that she has to do the next day, worrying about things in general.  She hence has not been able to sleep through the night.  She does take Belsomra, which helps to some extent.  She reports her pain is currently under control.  Patient denies any suicidality, homicidality or perceptual disturbances.  She is interested in continuing the Cymbalta at this time not interested in changing it to another SNRI or  SSRI.  Currently compliant on the Abilify.  Denies side effects.  Patient appeared to be Mcneil, oriented to person place time situation.  Patient reports she is motivated to stay in therapy.  Currently waiting for a new therapist.  Denies any other concerns today.  Visit Diagnosis:    ICD-10-CM   1. MDD (major depressive disorder), recurrent episode, mild ([place])  F33.0     2. PTSD (post-traumatic stress disorder)  F43.10     3. GAD (generalized anxiety disorder)  F41.1 hydrOXYzine (VISTARIL) 25 MG capsule    4. Insomnia due to medical condition  G47.01 Suvorexant (BELSOMRA) 20 MG TABS   Pain, anxiety    5. Bereavement  Z63.4       Past Psychiatric History: Reviewed past psychiatric history from progress note on 08/16/2017.  Past trials of Effexor, Klonopin.  Past Medical History:  Past Medical History:  Diagnosis Date   Abdominal pain    Anxiety and depression    Atypical facial pain    Bilateral occipital neuralgia    Cervical spondylosis without myelopathy    Chronic daily headache    Chronic pain    Chronic pelvic pain in female    Chronic thoracic back pain    Depression    Diabetes mellitus without complication (HCC)    Diabetes mellitus, type II (HCC)    Dysphagia    Dysrhythmia    Fibromyalgia    Hyperlipidemia    Hypertension    Hypothyroidism    Insomnia    Iron deficiency anemia    Left hip  pain    Low back pain    Migraines    Numbness    Optic neuropathy    OSA (obstructive sleep apnea)    Polyarthralgia    Primary osteoarthritis of both hips    Prothrombin gene mutation (HCC)    Pseudotumor cerebri    Rectal bleeding    Right shoulder pain    Rotator cuff syndrome    Stroke [facility])    Thyroid disease    TIA (transient ischemic attack) 05/27/2017    Past Surgical History:  Procedure Laterality Date   ABDOMINAL HYSTERECTOMY     total   CHOLECYSTECTOMY     COLONOSCOPY WITH PROPOFOL N/A 03/21/2018   Procedure: COLONOSCOPY WITH PROPOFOL;   Surgeon: Lucilla Lame, Christine Mcneil;  Location: [facility] ENDOSCOPY;  Service: Endoscopy;  Laterality: N/A;   ERCP N/A 08/19/2020   Procedure: ENDOSCOPIC RETROGRADE CHOLANGIOPANCREATOGRAPHY (ERCP);  Surgeon: Lucilla Lame, Christine Mcneil;  Location: [facility] ENDOSCOPY;  Service: Endoscopy;  Laterality: N/A;   ESOPHAGOGASTRODUODENOSCOPY (EGD) WITH PROPOFOL N/A 03/21/2018   Procedure: ESOPHAGOGASTRODUODENOSCOPY (EGD) WITH PROPOFOL;  Surgeon: Lucilla Lame, Christine Mcneil;  Location: [facility] ENDOSCOPY;  Service: Endoscopy;  Laterality: N/A;   GIVENS CAPSULE STUDY N/A 09/24/2019   Procedure: GIVENS CAPSULE STUDY;  Surgeon: Virgel Manifold, Christine Mcneil;  Location: [facility] ENDOSCOPY;  Service: Endoscopy;  Laterality: N/A;   KNEE SURGERY Left    LEFT HEART CATH AND CORONARY ANGIOGRAPHY N/A 03/10/2022   Procedure: LEFT HEART CATH AND CORONARY ANGIOGRAPHY;  Surgeon: Yolonda Kida, Christine Mcneil;  Location: [facility];  Service: Cardiovascular;  Laterality: N/A;   ROTATOR CUFF REPAIR Right    spinal neurostimulator      Family Psychiatric History: Reviewed family psychiatric history from progress note on 08/16/2017.  Family History:  Family History  Problem Relation Age of Onset   Diabetes Mother    Hyperlipidemia Mother    Hypertension Mother    COPD Mother    CVA Mother    Anemia Mother    Diabetes Father    Hyperlipidemia Father    Hypertension Father    Thyroid disease Father    Alcohol abuse Father    Peripheral vascular disease Sister    Hypertension Sister    Anxiety disorder Son    Depression Son    Bipolar disorder Son    Seizures Son     Social History: Reviewed social history from progress note on 08/16/2017. Social History   Socioeconomic History   Marital status: Divorced    Spouse name: Not on file   Number of children: 1   Years of education: Not on file   Highest education level: High school graduate  Occupational History    Comment: disablitiy  Tobacco Use   Smoking status: Never   Smokeless tobacco: Never   Vaping Use   Vaping Use: Never used  Substance and Sexual Activity   Alcohol use: No   Drug use: No   Sexual activity: Not Currently  Other Topics Concern   Not on file  Social History Narrative   Not on file   Social Determinants of Health   Financial Resource Strain: Low Risk  (07/19/2017)   Overall Financial Resource Strain (CARDIA)    Difficulty of Paying Living Expenses: Not hard at all  Food Insecurity: No Food Insecurity (07/19/2017)   Hunger Vital Sign    Worried About Running Out of Food in the Last Year: Never true    Ran Out of Food in the Last Year: Never true  Transportation Needs:  No Transportation Needs (07/19/2017)   PRAPARE - Hydrologist (Medical): No    Lack of Transportation (Non-Medical): No  Physical Activity: Inactive (07/19/2017)   Exercise Vital Sign    Days of Exercise per Week: 0 days    Minutes of Exercise per Session: 0 min  Stress: Stress Concern Present (07/19/2017)   [place]    Feeling of Stress : Very much  Social Connections: Moderately Isolated (07/19/2017)   Social Connection and Isolation Panel [NHANES]    Frequency of Communication with Friends and Family: Once a week    Frequency of Social Gatherings with Friends and Family: Never    Attends Religious Services: More than 4 times per year    Active Member of [employer] or Organizations: No    Attends Archivist Meetings: Never    Marital Status: Divorced    Allergies:  Allergies  Allergen Reactions   Amoxapine Itching   Amoxicillin Itching   Dapagliflozin Itching    Other reaction(s): Other (See Comments) Thrush & vaginal itching   Keflex [Cephalexin] Itching   Mirtazapine Other (See Comments)    Pt states felling "like my body is outside of me."    Metabolic Disorder Labs: Lab Results  Component Value Date   HGBA1C 9.9 (H) 06/21/2017   MPG 237.43 06/21/2017   Lab Results   Component Value Date   PROLACTIN 4.3 (L) 05/26/2021   PROLACTIN 5.5 09/06/2017   Lab Results  Component Value Date   CHOL 157 05/26/2021   TRIG 245 (H) 05/26/2021   HDL 32 (L) 05/26/2021   CHOLHDL 4.9 (H) 05/26/2021   VLDL 39 06/21/2017   LDLCALC 84 05/26/2021   LDLCALC 174 (H) 06/21/2017   Lab Results  Component Value Date   TSH 2.890 09/06/2017   TSH 3.176 01/04/2017    Therapeutic Level Labs: No results found for: "LITHIUM" No results found for: "VALPROATE" No results found for: "CBMZ"  Current Medications: Current Outpatient Medications  Medication Sig Dispense Refill   ACCU-CHEK AVIVA PLUS test strip      ACCU-CHEK SOFTCLIX LANCETS lancets      Acetaminophen (TYLENOL ARTHRITIS EXT RELIEF PO) Take 600 mg by mouth daily.     albuterol (VENTOLIN HFA) 108 (90 Base) MCG/ACT inhaler Inhale 2 puffs into the lungs every 6 (six) hours as needed for wheezing or shortness of breath. 1 g 0   ARIPiprazole (ABILIFY) 2 MG tablet Take 0.5 tablets (1 mg total) by mouth daily. 45 tablet 0   atorvastatin (LIPITOR) 80 MG tablet Take 80 mg by mouth daily at 6 PM.     Blood Glucose Monitoring Suppl (ACCU-CHEK AVIVA PLUS) w/Device KIT      Carboxymethylcellulose Sod PF 0.5 % SOLN Apply 1 drop to eye daily as needed.     Cholecalciferol (VITAMIN D3) 1000 units CAPS Take 1 capsule by mouth daily.     clobetasol ointment (TEMOVATE) 0.05 % Apply topically.     Continuous Blood Gluc Receiver (DEXCOM G6 RECEIVER) DEVI Use to monitor blood sugar. [place] [phone]     Continuous Blood Gluc Sensor (DEXCOM G6 SENSOR) MISC Use to monitor blood sugar.  Replace every 10 days. [place] [phone].     Continuous Blood Gluc Transmit (DEXCOM G6 TRANSMITTER) MISC Use to monitor blood sugar.  Replace every 3 months. [place] [phone].     cyclobenzaprine (FLEXERIL) 5 MG tablet Take 1 tablet by mouth 3 (three) times daily as  needed.     dabigatran (PRADAXA) 150 MG CAPS capsule Take 150 mg by mouth 2 (two)  times daily.     diltiazem (CARDIZEM CD) 240 MG 24 hr capsule Take by mouth.     DULoxetine (CYMBALTA) 60 MG capsule Take 1 capsule (60 mg total) by mouth 2 (two) times daily. 180 capsule 0   esomeprazole (NEXIUM) 40 MG capsule Take 40 mg by mouth daily.     ezetimibe (ZETIA) 10 MG tablet Take 10 mg by mouth at bedtime.     famotidine (PEPCID) 40 MG tablet Take 40 mg by mouth at bedtime.     ferrous sulfate 325 (65 FE) MG tablet Take by mouth. Take 325 mg daily with breakfast     fluticasone (FLONASE) 50 MCG/ACT nasal spray Place 2 sprays into both nostrils as needed.      furosemide (LASIX) 40 MG tablet TAKE (1) TABLET BY MOUTH EVERY DAY     gabapentin (NEURONTIN) 600 MG tablet Take 1 tablet (600 mg total) by mouth daily. Dose change 90 tablet 0   insulin degludec (TRESIBA FLEXTOUCH) 200 UNIT/ML FlexTouch Pen Inject into the skin.     Insulin Lispro w/ Trans Port 100 UNIT/ML SOPN INJECT UP TO 50 UNITS DAILY DIVIDED DOSES, AS DIRECTED     Insulin Pen Needle (TRUEPLUS PEN NEEDLES) 29G X 12MM MISC      lamoTRIgine (LAMICTAL) 100 MG tablet Take 100 mg by mouth daily.     levothyroxine (SYNTHROID, LEVOTHROID) 100 MCG tablet Take 100 mcg by mouth daily.     lidocaine 4 % 1 patch daily.     magnesium oxide (MAG-OX) 400 MG tablet Take 1 tablet by mouth daily.     Melatonin 10 MG TABS Take 2 tablets by mouth at bedtime.     meloxicam (MOBIC) 15 MG tablet Take 15 mg by mouth daily.     metFORMIN (GLUCOPHAGE-XR) 500 MG 24 hr tablet Take 500 mg by mouth 2 (two) times daily.      metoprolol succinate (TOPROL-XL) 25 MG 24 hr tablet Take 1 tablet by mouth daily.     mometasone (ELOCON) 0.1 % cream Apply topically.     naloxone (NARCAN) nasal spray 4 mg/0.1 mL Place into the nose.     nystatin (MYCOSTATIN) 100000 UNIT/ML suspension Take 5 mLs by mouth 4 (four) times daily.     omeprazole (PRILOSEC) 20 MG capsule TAKE (1) CAPSULE BY MOUTH EVERY DAY 30 capsule 0   ondansetron (ZOFRAN) 4 MG tablet Take 4 mg  by mouth every 8 (eight) hours as needed.     Riboflavin 400 MG TABS Take 1 tablet by mouth every night  for headaches     RYBELSUS 7 MG TABS Take 1 tablet by mouth daily.     Suvorexant (BELSOMRA) 20 MG TABS Take 1 tablet (20 mg total) by mouth at bedtime. 30 tablet 1   TOUJEO SOLOSTAR 300 UNIT/ML Solostar Pen SMARTSIG:76 Unit(s) SUB-Q Every Night     vitamin B-12 (CYANOCOBALAMIN) 100 MCG tablet Take 1 tablet (100 mcg total) by mouth daily. 90 tablet 1   XTAMPZA ER 9 MG C12A PLEASE SEE ATTACHED FOR DETAILED DIRECTIONS     zonisamide (ZONEGRAN) 100 MG capsule Take 100 mg by mouth daily.     hydrOXYzine (VISTARIL) 25 MG capsule Take 1-2 capsules (25-50 mg total) by mouth 2 (two) times daily as needed for anxiety (and sleep). 120 capsule 1   nitroGLYCERIN (NITROSTAT) 0.4 MG SL tablet Place under the  tongue.     pioglitazone (ACTOS) 15 MG tablet Take by mouth. Take 1 tablet by mouth once daily     No current facility-administered medications for this visit.   Facility-Administered Medications Ordered in Other Visits  Medication Dose Route Frequency Provider Last Rate Last Admin   iohexol (OMNIPAQUE) 300 MG/ML solution    PRN [place], Dwayne D, Christine Mcneil   60 mL at 03/10/22 1442     Musculoskeletal: Strength & Muscle Tone:  UTA Gait & Station:  Seated Patient leans: N/A  Psychiatric Specialty Exam: Review of Systems  Psychiatric/Behavioral:  Positive for sleep disturbance. The patient is nervous/anxious.   All other systems reviewed and are negative.   There were no vitals taken for this visit.There is no height or weight on file to calculate BMI.  General Appearance: Casual  Eye Contact:  Fair  Speech:  Clear and Coherent  Volume:  Normal  Mood:  Anxious  Affect:  Congruent  Thought Process:  Goal Directed and Descriptions of Associations: Intact  Orientation:  Full (Time, Place, and Person)  Thought Content: Logical   Suicidal Thoughts:  No  Homicidal Thoughts:  No  Memory:   Immediate;   Fair Recent;   Fair Remote;   Fair  Judgement:  Fair  Insight:  Fair  Psychomotor Activity:  Normal  Concentration:  Concentration: Fair and Attention Span: Fair  Recall:  [employer] of Knowledge: Fair  Language: Fair  Akathisia:  No  Handed:  Right  AIMS (if indicated): not done  Assets:  Quarry manager  ADL's:  Intact  Cognition: WNL  Sleep:  Poor   Screenings: AIMS    Flowsheet Row Video Visit from 02/04/2022 in [place] Office Visit from 12/30/2021 in [facility] Office Visit from 11/18/2021 in [facility] Video Visit from 09/03/2021 in [facility] Total Score 0 0 0 3      GAD-7    Flowsheet Row Counselor from 06/23/2022 in [facility] Counselor from 05/18/2022 in [place] [facility] Counselor from 05/04/2022 in [place] Office Visit from 04/20/2022 in [facility] Video Visit from 03/29/2022 in [place]  Total GAD-7 Score 10 18 16 19 4      $ PHQ2-9    Flowsheet Row Counselor from 06/23/2022 in [place] Counselor from 06/10/2022 in [place] Counselor from 05/18/2022 in [place] Office Visit from 04/20/2022 in [facility] Office Visit from 12/30/2021 in [facility]  PHQ-2 Total Score 2 5 5 5 1  $ PHQ-9 Total Score 4 16 12 15 7      $ Flowsheet Row Video Visit from 07/09/2022 in [place] Counselor from 06/23/2022 in [place] Video Visit from 06/22/2022 in [place] CATEGORY Low Risk No Risk Low Risk        Assessment and Plan: Christine Mcneil is a 57 year old Caucasian female who has a history of MDD, PTSD, multiple medical problems including chronic pain was evaluated by telemedicine today.  Patient continues to struggle with multiple situational stressors which is contributing to anxiety, depression symptoms, sleep problems.  She will benefit from continued psychotherapy sessions, medication management as noted below.  Plan MDD-unstable Cymbalta 120 mg p.o. daily in divided dosage.  Patient is not interested in changing this to another SSRI or SNRI but this was discussed with patient Gabapentin 600 mg p.o. daily.  She is only taking once a day dosage although prescribed as twice a day. Abilify 1 mg p.o. daily Aware of drug to drug interaction between Abilify and zonisamide including heat intolerance, risk of heat stroke, decreased sweating. Patient advised to have more frequent psychotherapy session-motivated to do so, currently waiting an appointment with her new therapist.  GAD-unstable Increase hydroxyzine to 25-50 mg twice a day as needed Patient to have more frequent psychotherapy sessions Continue Cymbalta 120 mg p.o. daily for now.  PTSD-improving Continue CBT  Insomnia-unstable Increase Belsomra to 20 mg p.o. nightly Reviewed [place] PMP AWARxE Patient also advised she could take hydroxyzine an extra dosage at night if she wakes up in the middle of the night or she could take a 50 mg at bedtime as needed Patient to work on sleep hygiene. She will continue to need sufficient pain management.  Bereavement-improving Will monitor closely  Follow-up in clinic in 4 to 5 weeks or sooner if needed.   Collaboration of Care: Collaboration of Care: Referral or follow-up with counselor/therapist AEB  encouraged to continue to follow-up with therapist.  Patient/Guardian was advised Release of Information must be obtained prior to any record release in order to collaborate their care with an outside provider. Patient/Guardian was advised if they have not already done so to contact the registration department to sign all necessary forms in order for [name] to release information regarding their care.   Consent: Patient/Guardian gives verbal consent for treatment and assignment of benefits for services provided during this visit. Patient/Guardian expressed understanding and agreed to proceed.   This note was generated in part or whole with voice recognition software. Voice recognition is usually quite accurate but there are transcription errors that can and very often do occur. I apologize for any typographical errors that were not detected and corrected.      Christine Alert, Christine Mcneil 07/09/2022, 12:38 PM

## 2022-08-17 ENCOUNTER — Other Ambulatory Visit: Payer: Self-pay | Admitting: Psychiatry

## 2022-08-17 ENCOUNTER — Telehealth (INDEPENDENT_AMBULATORY_CARE_PROVIDER_SITE_OTHER): Payer: 59 | Admitting: Psychiatry

## 2022-08-17 ENCOUNTER — Encounter: Payer: Self-pay | Admitting: Psychiatry

## 2022-08-17 DIAGNOSIS — G4701 Insomnia due to medical condition: Secondary | ICD-10-CM | POA: Diagnosis not present

## 2022-08-17 DIAGNOSIS — F411 Generalized anxiety disorder: Secondary | ICD-10-CM

## 2022-08-17 DIAGNOSIS — F330 Major depressive disorder, recurrent, mild: Secondary | ICD-10-CM | POA: Diagnosis not present

## 2022-08-17 DIAGNOSIS — F4310 Post-traumatic stress disorder, unspecified: Secondary | ICD-10-CM

## 2022-08-17 DIAGNOSIS — Z634 Disappearance and death of family member: Secondary | ICD-10-CM

## 2022-08-17 MED ORDER — BREXPIPRAZOLE 0.25 MG PO TABS: 0.2500 mg | ORAL_TABLET | Freq: Every day | ORAL | 1 refills | Status: DC

## 2022-08-17 NOTE — Progress Notes (Unsigned)
Virtual Visit via Telephone Note  I connected with Christine Mcneil on 08/17/22 at  4:00 PM EDT by telephone and verified that I am speaking with the correct person using two identifiers.   Location Provider Location : Remote office Patient Location : Home  Participants: Patient , Provider, Friend Gwinda Passe) I discussed the limitations, risks, security and privacy concerns of performing an evaluation and management service by telephone and the availability of in person appointments. I also discussed with the patient that there may be a patient responsible charge related to this service. The patient expressed understanding and agreed to proceed.   I discussed the assessment and treatment plan with the patient. The patient was provided an opportunity to ask questions and all were answered. The patient agreed with the plan and demonstrated an understanding of the instructions.   The patient was advised to call back or seek an in-person evaluation if the symptoms worsen or if the condition fails to improve as anticipated.     [facility] MD OP Progress Note  08/18/2022 10:36 AM Christine Mcneil  MRN:  [id number]  Chief Complaint:  Chief Complaint  Patient presents with   Follow-up   Anxiety   Depression   Medication Refill   HPI: Christine Mcneil is a 57 year old Caucasian female, divorced, lives in [place], has a history of MDD, PTSD, insomnia, OSA, history of CVA, history of prothrombin gene mutation, chronic pain, migraine headaches, vitamin B12 deficiency, iron deficiency was evaluated by phone today.  Patient today reports she is currently struggling with depression symptoms.  Struggles with sadness, low motivation.  Reports she feels overwhelmed often.  She is currently taking care of her mother who needs a lot of support.  That does contribute to a lot of her mood symptoms.  She however reports anxiety symptoms may have improved since being on the higher dosage of hydroxyzine as well as Cymbalta.  She  would like to stay on the Cymbalta at this time.  Patient reports sleep has improved on the higher dosage of Belsomra.  Currently tolerating it well.  Patient denies any suicidality, homicidality or perceptual disturbances.  Patient denies any other concerns today.  Visit Diagnosis:    ICD-10-CM   1. MDD (major depressive disorder), recurrent episode, mild (HCC)  F33.0 brexpiprazole (REXULTI) 0.25 MG TABS tablet    2. PTSD (post-traumatic stress disorder)  F43.10     3. GAD (generalized anxiety disorder)  F41.1     4. Insomnia due to medical condition  G47.01    Pain, anxiety    5. Bereavement  Z63.4       Past Psychiatric History: Reviewed past psychiatric history from progress note on 08/16/2017.  Past trials of Effexor, Klonopin.  Past Medical History:  Past Medical History:  Diagnosis Date   Abdominal pain    Anxiety and depression    Atypical facial pain    Bilateral occipital neuralgia    Cervical spondylosis without myelopathy    Chronic daily headache    Chronic pain    Chronic pelvic pain in female    Chronic thoracic back pain    Depression    Diabetes mellitus without complication (HCC)    Diabetes mellitus, type II (HCC)    Dysphagia    Dysrhythmia    Fibromyalgia    Hyperlipidemia    Hypertension    Hypothyroidism    Insomnia    Iron deficiency anemia    Left hip pain    Low back pain  Migraines    Numbness    Optic neuropathy    OSA (obstructive sleep apnea)    Polyarthralgia    Primary osteoarthritis of both hips    Prothrombin gene mutation (HCC)    Pseudotumor cerebri    Rectal bleeding    Right shoulder pain    Rotator cuff syndrome    Stroke [facility])    Thyroid disease    TIA (transient ischemic attack) 05/27/2017    Past Surgical History:  Procedure Laterality Date   ABDOMINAL HYSTERECTOMY     total   CHOLECYSTECTOMY     COLONOSCOPY WITH PROPOFOL N/A 03/21/2018   Procedure: COLONOSCOPY WITH PROPOFOL;  Surgeon: Lucilla Lame, MD;   Location: [facility] ENDOSCOPY;  Service: Endoscopy;  Laterality: N/A;   ERCP N/A 08/19/2020   Procedure: ENDOSCOPIC RETROGRADE CHOLANGIOPANCREATOGRAPHY (ERCP);  Surgeon: Lucilla Lame, MD;  Location: [facility] ENDOSCOPY;  Service: Endoscopy;  Laterality: N/A;   ESOPHAGOGASTRODUODENOSCOPY (EGD) WITH PROPOFOL N/A 03/21/2018   Procedure: ESOPHAGOGASTRODUODENOSCOPY (EGD) WITH PROPOFOL;  Surgeon: Lucilla Lame, MD;  Location: [facility] ENDOSCOPY;  Service: Endoscopy;  Laterality: N/A;   GIVENS CAPSULE STUDY N/A 09/24/2019   Procedure: GIVENS CAPSULE STUDY;  Surgeon: Virgel Manifold, MD;  Location: [facility] ENDOSCOPY;  Service: Endoscopy;  Laterality: N/A;   KNEE SURGERY Left    LEFT HEART CATH AND CORONARY ANGIOGRAPHY N/A 03/10/2022   Procedure: LEFT HEART CATH AND CORONARY ANGIOGRAPHY;  Surgeon: Yolonda Kida, MD;  Location: [facility];  Service: Cardiovascular;  Laterality: N/A;   ROTATOR CUFF REPAIR Right    spinal neurostimulator      Family Psychiatric History: Reviewed family psychiatric history from progress note on 08/16/2017.  Family History:  Family History  Problem Relation Age of Onset   Diabetes Mother    Hyperlipidemia Mother    Hypertension Mother    COPD Mother    CVA Mother    Anemia Mother    Diabetes Father    Hyperlipidemia Father    Hypertension Father    Thyroid disease Father    Alcohol abuse Father    Peripheral vascular disease Sister    Hypertension Sister    Anxiety disorder Son    Depression Son    Bipolar disorder Son    Seizures Son     Social History: Reviewed social history from progress note on 08/16/2017. Social History   Socioeconomic History   Marital status: Divorced    Spouse name: Not on file   Number of children: 1   Years of education: Not on file   Highest education level: High school graduate  Occupational History    Comment: disablitiy  Tobacco Use   Smoking status: Never   Smokeless tobacco: Never  Vaping Use   Vaping Use:  Never used  Substance and Sexual Activity   Alcohol use: No   Drug use: No   Sexual activity: Not Currently  Other Topics Concern   Not on file  Social History Narrative   Not on file   Social Determinants of Health   Financial Resource Strain: Low Risk  (07/19/2017)   Overall Financial Resource Strain (CARDIA)    Difficulty of Paying Living Expenses: Not hard at all  Food Insecurity: No Food Insecurity (07/19/2017)   Hunger Vital Sign    Worried About Running Out of Food in the Last Year: Never true    Ran Out of Food in the Last Year: Never true  Transportation Needs: No Transportation Needs (07/19/2017)   PRAPARE - Transportation  Lack of Transportation (Medical): No    Lack of Transportation (Non-Medical): No  Physical Activity: Inactive (07/19/2017)   Exercise Vital Sign    Days of Exercise per Week: 0 days    Minutes of Exercise per Session: 0 min  Stress: Stress Concern Present (07/19/2017)   [place]    Feeling of Stress : Very much  Social Connections: Moderately Isolated (07/19/2017)   Social Connection and Isolation Panel [NHANES]    Frequency of Communication with Friends and Family: Once a week    Frequency of Social Gatherings with Friends and Family: Never    Attends Religious Services: More than 4 times per year    Active Member of [employer] or Organizations: No    Attends Archivist Meetings: Never    Marital Status: Divorced    Allergies:  Allergies  Allergen Reactions   Amoxapine Itching   Amoxicillin Itching   Dapagliflozin Itching    Other reaction(s): Other (See Comments) Thrush & vaginal itching   Keflex [Cephalexin] Itching   Mirtazapine Other (See Comments)    Pt states felling "like my body is outside of me."    Metabolic Disorder Labs: Lab Results  Component Value Date   HGBA1C 9.9 (H) 06/21/2017   MPG 237.43 06/21/2017   Lab Results  Component Value Date    PROLACTIN 4.3 (L) 05/26/2021   PROLACTIN 5.5 09/06/2017   Lab Results  Component Value Date   CHOL 157 05/26/2021   TRIG 245 (H) 05/26/2021   HDL 32 (L) 05/26/2021   CHOLHDL 4.9 (H) 05/26/2021   VLDL 39 06/21/2017   LDLCALC 84 05/26/2021   LDLCALC 174 (H) 06/21/2017   Lab Results  Component Value Date   TSH 2.890 09/06/2017   TSH 3.176 01/04/2017    Therapeutic Level Labs: No results found for: "LITHIUM" No results found for: "VALPROATE" No results found for: "CBMZ"  Current Medications: Current Outpatient Medications  Medication Sig Dispense Refill   ACCU-CHEK AVIVA PLUS test strip      ACCU-CHEK SOFTCLIX LANCETS lancets      Acetaminophen (TYLENOL ARTHRITIS EXT RELIEF PO) Take 600 mg by mouth daily.     albuterol (VENTOLIN HFA) 108 (90 Base) MCG/ACT inhaler Inhale 2 puffs into the lungs every 6 (six) hours as needed for wheezing or shortness of breath. 1 g 0   atorvastatin (LIPITOR) 80 MG tablet Take 80 mg by mouth daily at 6 PM.     Blood Glucose Monitoring Suppl (ACCU-CHEK AVIVA PLUS) w/Device KIT      brexpiprazole (REXULTI) 0.25 MG TABS tablet Take 1 tablet (0.25 mg total) by mouth daily. 30 tablet 1   Carboxymethylcellulose Sod PF 0.5 % SOLN Apply 1 drop to eye daily as needed.     Cholecalciferol (VITAMIN D3) 1000 units CAPS Take 1 capsule by mouth daily.     clobetasol ointment (TEMOVATE) 0.05 % Apply topically.     Continuous Blood Gluc Receiver (DEXCOM G6 RECEIVER) DEVI Use to monitor blood sugar. [place] [phone]     Continuous Blood Gluc Sensor (DEXCOM G6 SENSOR) MISC Use to monitor blood sugar.  Replace every 10 days. [place] [phone].     Continuous Blood Gluc Transmit (DEXCOM G6 TRANSMITTER) MISC Use to monitor blood sugar.  Replace every 3 months. [place] [phone].     cyclobenzaprine (FLEXERIL) 5 MG tablet Take 1 tablet by mouth 3 (three) times daily as needed.     dabigatran (PRADAXA) 150 MG CAPS capsule  Take 150 mg by mouth 2 (two) times daily.      diltiazem (CARDIZEM CD) 240 MG 24 hr capsule Take by mouth.     DULoxetine (CYMBALTA) 60 MG capsule Take 1 capsule (60 mg total) by mouth 2 (two) times daily. 180 capsule 0   esomeprazole (NEXIUM) 40 MG capsule Take 40 mg by mouth daily.     ezetimibe (ZETIA) 10 MG tablet Take 10 mg by mouth at bedtime.     famotidine (PEPCID) 40 MG tablet Take 40 mg by mouth at bedtime.     ferrous sulfate 325 (65 FE) MG tablet Take by mouth. Take 325 mg daily with breakfast     fluticasone (FLONASE) 50 MCG/ACT nasal spray Place 2 sprays into both nostrils as needed.      furosemide (LASIX) 40 MG tablet TAKE (1) TABLET BY MOUTH EVERY DAY     gabapentin (NEURONTIN) 600 MG tablet Take 1 tablet (600 mg total) by mouth daily. Dose change 90 tablet 0   hydrOXYzine (VISTARIL) 25 MG capsule Take 1-2 capsules (25-50 mg total) by mouth 2 (two) times daily as needed for anxiety (and sleep). 120 capsule 1   insulin degludec (TRESIBA FLEXTOUCH) 200 UNIT/ML FlexTouch Pen Inject into the skin.     Insulin Lispro w/ Trans Port 100 UNIT/ML SOPN INJECT UP TO 50 UNITS DAILY DIVIDED DOSES, AS DIRECTED     Insulin Pen Needle (TRUEPLUS PEN NEEDLES) 29G X 12MM MISC      lamoTRIgine (LAMICTAL) 100 MG tablet Take 100 mg by mouth daily.     levothyroxine (SYNTHROID, LEVOTHROID) 100 MCG tablet Take 100 mcg by mouth daily.     lidocaine 4 % 1 patch daily.     magnesium oxide (MAG-OX) 400 MG tablet Take 1 tablet by mouth daily.     Melatonin 10 MG TABS Take 2 tablets by mouth at bedtime.     meloxicam (MOBIC) 15 MG tablet Take 15 mg by mouth daily.     metFORMIN (GLUCOPHAGE-XR) 500 MG 24 hr tablet Take 500 mg by mouth 2 (two) times daily.      metoprolol succinate (TOPROL-XL) 25 MG 24 hr tablet Take 1 tablet by mouth daily.     mometasone (ELOCON) 0.1 % cream Apply topically.     naloxone (NARCAN) nasal spray 4 mg/0.1 mL Place into the nose.     nystatin (MYCOSTATIN) 100000 UNIT/ML suspension Take 5 mLs by mouth 4 (four) times daily.      omeprazole (PRILOSEC) 20 MG capsule TAKE (1) CAPSULE BY MOUTH EVERY DAY 30 capsule 0   ondansetron (ZOFRAN) 4 MG tablet Take 4 mg by mouth every 8 (eight) hours as needed.     pioglitazone (ACTOS) 15 MG tablet Take by mouth. Take 1 tablet by mouth once daily     Riboflavin 400 MG TABS Take 1 tablet by mouth every night  for headaches     RYBELSUS 7 MG TABS Take 1 tablet by mouth daily.     Suvorexant (BELSOMRA) 20 MG TABS Take 1 tablet (20 mg total) by mouth at bedtime. 30 tablet 1   TOUJEO SOLOSTAR 300 UNIT/ML Solostar Pen SMARTSIG:76 Unit(s) SUB-Q Every Night     vitamin B-12 (CYANOCOBALAMIN) 100 MCG tablet Take 1 tablet (100 mcg total) by mouth daily. 90 tablet 1   XTAMPZA ER 9 MG C12A PLEASE SEE ATTACHED FOR DETAILED DIRECTIONS     zonisamide (ZONEGRAN) 100 MG capsule Take 100 mg by mouth daily.     nitroGLYCERIN (  NITROSTAT) 0.4 MG SL tablet Place under the tongue.     No current facility-administered medications for this visit.   Facility-Administered Medications Ordered in Other Visits  Medication Dose Route Frequency Provider Last Rate Last Admin   iohexol (OMNIPAQUE) 300 MG/ML solution    PRN Callwood, Dwayne D, MD   60 mL at 03/10/22 1442     Musculoskeletal: Strength & Muscle Tone:  UTA Gait & Station:  Seated Patient leans: N/A  Psychiatric Specialty Exam: Review of Systems  Psychiatric/Behavioral:  Positive for dysphoric mood.   All other systems reviewed and are negative.   There were no vitals taken for this visit.There is no height or weight on file to calculate BMI.  General Appearance:  UTA  Eye Contact:   UTA  Speech:  Clear and Coherent  Volume:  Normal  Mood:  Depressed  Affect:   UTA  Thought Process:  Goal Directed and Descriptions of Associations: Intact  Orientation:  Full (Time, Place, and Person)  Thought Content: Logical   Suicidal Thoughts:  No  Homicidal Thoughts:  No  Memory:  Immediate;   Fair Recent;   Fair Remote;   Fair  Judgement:   Fair  Insight:  Fair  Psychomotor Activity:  Normal  Concentration:  Concentration: Fair and Attention Span: Fair  Recall:  [employer] of Knowledge: Fair  Language: Fair  Akathisia:  No  Handed:  Right  AIMS (if indicated): not done  Assets:  Communication Skills Desire for [place] Talents/Skills  ADL's:  Intact  Cognition: WNL  Sleep:   Improving   Screenings: AIMS    Flowsheet Row Video Visit from 02/04/2022 in [place] Office Visit from 12/30/2021 in [place] Office Visit from 11/18/2021 in [place] Video Visit from 09/03/2021 in [facility] Total Score 0 0 0 3      GAD-7    Flowsheet Row Counselor from 06/23/2022 in [place] Counselor from 05/18/2022 in [place] Counselor from 05/04/2022 in [place] Office Visit from 04/20/2022 in [place] Video Visit from 03/29/2022 in [place]  Total GAD-7 Score 10 18 16 19 4       PHQ2-9    Flowsheet Row Counselor from 06/23/2022 in [place] Counselor from 06/10/2022 in [place] Counselor from 05/18/2022 in [place] Office Visit from 04/20/2022 in [place] Office Visit from 12/30/2021 in [facility]  PHQ-2 Total Score 2 5 5 5 1   PHQ-9 Total Score 4 16 12 15 7       Flowsheet Row Video Visit from 07/09/2022 in [place] Counselor from 06/23/2022 in [place] Video Visit from 06/22/2022 in [place] CATEGORY Low Risk No Risk Low Risk        Assessment and Plan: Christine Mcneil is a 57 year old Caucasian female who has a history of MDD, PTSD, multiple medical problems including chronic pain was evaluated by phone today.  Patient continues to struggle with depression although anxiety is improving.  Plan as noted below.  Plan MDD-unstable Continue Cymbalta 120 mg p.o. daily in  divided dosage Discontinue Abilify, higher dosages caused side effects. Start Rexulti 0.25 mg p.o. daily. Will consider getting an EKG to monitor for QTc prolongation. Gabapentin 600 mg p.o. daily-prescribed for pain ralthough it is also a good mood stabilizer.  GAD-improving Hydroxyzine 25-50 mg p.o. twice daily as needed Cymbalta 120 mg p.o. daily. Continue CBT  PTSD-improving Continue CBT  Insomnia-improving Belsomra 20 mg p.o. nightly Patient could use an extra dose of hydroxyzine at night as needed if she has interrupted sleep. Continue sleep hygiene She will need sufficient pain management  Bereavement-improving Will monitor closely  Follow-up in clinic in 4 weeks or sooner in person.   Consent: Patient/Guardian gives verbal consent for treatment and assignment of benefits for services provided during this visit. Patient/Guardian expressed understanding and agreed to proceed.    I have spent at least 13 minutes non face to face with patient today .  This note was generated in part or whole with voice recognition software. Voice recognition is usually quite accurate but there are transcription errors that can and very often do occur. I apologize for any typographical errors that were not detected and corrected.    Ursula Alert, MD 08/18/2022, 10:36 AM

## 2022-08-18 ENCOUNTER — Ambulatory Visit: Payer: 59 | Admitting: Pulmonary Disease

## 2022-09-14 ENCOUNTER — Other Ambulatory Visit: Payer: Self-pay | Admitting: Psychiatry

## 2022-09-14 DIAGNOSIS — G4701 Insomnia due to medical condition: Secondary | ICD-10-CM

## 2022-09-15 ENCOUNTER — Ambulatory Visit (INDEPENDENT_AMBULATORY_CARE_PROVIDER_SITE_OTHER): Payer: 59 | Admitting: Psychiatry

## 2022-09-15 ENCOUNTER — Encounter: Payer: Self-pay | Admitting: Psychiatry

## 2022-09-15 VITALS — BP 112/74 | HR 81 | Temp 97.8°F | Ht 66.0 in | Wt 240.2 lb

## 2022-09-15 DIAGNOSIS — F3341 Major depressive disorder, recurrent, in partial remission: Secondary | ICD-10-CM | POA: Diagnosis not present

## 2022-09-15 DIAGNOSIS — F411 Generalized anxiety disorder: Secondary | ICD-10-CM

## 2022-09-15 DIAGNOSIS — Z634 Disappearance and death of family member: Secondary | ICD-10-CM

## 2022-09-15 DIAGNOSIS — F4310 Post-traumatic stress disorder, unspecified: Secondary | ICD-10-CM | POA: Diagnosis not present

## 2022-09-15 DIAGNOSIS — G4701 Insomnia due to medical condition: Secondary | ICD-10-CM

## 2022-09-15 DIAGNOSIS — F330 Major depressive disorder, recurrent, mild: Secondary | ICD-10-CM

## 2022-09-15 MED ORDER — HYDROXYZINE PAMOATE 25 MG PO CAPS
25.0000 mg | ORAL_CAPSULE | Freq: Two times a day (BID) | ORAL | 1 refills | Status: DC | PRN
Start: 2022-09-15 — End: 2022-11-22

## 2022-09-15 MED ORDER — DULOXETINE HCL 60 MG PO CPEP
60.0000 mg | ORAL_CAPSULE | Freq: Two times a day (BID) | ORAL | 0 refills | Status: DC
Start: 2022-09-15 — End: 2023-01-07

## 2022-09-15 MED ORDER — GABAPENTIN 600 MG PO TABS: 600.0000 mg | ORAL_TABLET | Freq: Every day | ORAL | 0 refills | Status: DC

## 2022-09-15 MED ORDER — BELSOMRA 20 MG PO TABS
20.0000 mg | ORAL_TABLET | Freq: Every day | ORAL | 2 refills | Status: DC
Start: 2022-09-15 — End: 2022-12-31

## 2022-09-15 NOTE — Progress Notes (Signed)
BH MD OP Progress Note  09/15/2022 2:24 PM Christine Mcneil  MRN:  [id number]  Chief Complaint:  Chief Complaint  Patient presents with   Follow-up   Medication Refill   Anxiety   Depression   HPI: Christine Mcneil is a 57 year old Caucasian female, divorced, lives in [place] has a history of MDD, PTSD, insomnia, OSA, history of CVA, history of prothrombin gene mutation, chronic pain, migraine headaches, vitamin B12 deficiency, iron deficiency was evaluated in office today.  Patient today reports she continues to struggle with situational stressors, taking care of her elderly mother, having no social support system, having to clean up after her Mcneil and his girlfriend when they come to visit her mother.  Patient also struggles with her own health problems including uncontrolled diabetes melitis.  She reports the Rexulti  has helped with her depression symptoms.  She does not feel as depressed as she was before.  She reports sleep is improved, she continues to use the Belsomra.  Denies any significant problems with her energy level, reports appetite is fair, reports concentration is good.  Patient however does struggle with anxiety symptoms, mostly situational.  Does have hydroxyzine available which she uses as needed.  Interested in getting established with a therapist.  Patient denies any suicidality, homicidality or perceptual disturbances.  Patient denies any side effects to medications.  Reports she is compliant.  Patient denies any other concerns today.  Visit Diagnosis:    ICD-10-CM   1. Recurrent major depressive disorder, in partial remission (HCC)  F33.41     2. PTSD (post-traumatic stress disorder)  F43.10 gabapentin (NEURONTIN) 600 MG tablet    3. GAD (generalized anxiety disorder)  F41.1 hydrOXYzine (VISTARIL) 25 MG capsule    gabapentin (NEURONTIN) 600 MG tablet    4. Insomnia due to medical condition  G47.01 Suvorexant (BELSOMRA) 20 MG TABS   Pain, anxiety    5. Bereavement   Z63.4 DULoxetine (CYMBALTA) 60 MG capsule      Past Psychiatric History: Reviewed past psychiatric history from progress note on 08/16/2017.  Past trials of Effexor, Klonopin.  Past Medical History:  Past Medical History:  Diagnosis Date   Abdominal pain    Anxiety and depression    Atypical facial pain    Bilateral occipital neuralgia    Cervical spondylosis without myelopathy    Chronic daily headache    Chronic pain    Chronic pelvic pain in female    Chronic thoracic back pain    Depression    Diabetes mellitus without complication (HCC)    Diabetes mellitus, type II (HCC)    Dysphagia    Dysrhythmia    Fibromyalgia    Hyperlipidemia    Hypertension    Hypothyroidism    Insomnia    Iron deficiency anemia    Left hip pain    Low back pain    Migraines    Numbness    Optic neuropathy    OSA (obstructive sleep apnea)    Polyarthralgia    Primary osteoarthritis of both hips    Prothrombin gene mutation (HCC)    Pseudotumor cerebri    Rectal bleeding    Right shoulder pain    Rotator cuff syndrome    Stroke [facility])    Thyroid disease    TIA (transient ischemic attack) 05/27/2017    Past Surgical History:  Procedure Laterality Date   ABDOMINAL HYSTERECTOMY     total   CHOLECYSTECTOMY     COLONOSCOPY WITH PROPOFOL  N/A 03/21/2018   Procedure: COLONOSCOPY WITH PROPOFOL;  Surgeon: Midge Minium, MD;  Location: [facility] ENDOSCOPY;  Service: Endoscopy;  Laterality: N/A;   ERCP N/A 08/19/2020   Procedure: ENDOSCOPIC RETROGRADE CHOLANGIOPANCREATOGRAPHY (ERCP);  Surgeon: Midge Minium, MD;  Location[facility] ENDOSCOPY;  Service: Endoscopy;  Laterality: N/A;   ESOPHAGOGASTRODUODENOSCOPY (EGD) WITH PROPOFOL N/A 03/21/2018   Procedure: ESOPHAGOGASTRODUODENOSCOPY (EGD) WITH PROPOFOL;  Surgeon: Midge Minium, MD;  Location: [facility] ENDOSCOPY;  Service: Endoscopy;  Laterality: N/A;   GIVENS CAPSULE STUDY N/A 09/24/2019   Procedure: GIVENS CAPSULE STUDY;  Surgeon: Pasty Spillers, MD;   Location: [facility] ENDOSCOPY;  Service: Endoscopy;  Laterality: N/A;   KNEE SURGERY Left    LEFT HEART CATH AND CORONARY ANGIOGRAPHY N/A 03/10/2022   Procedure: LEFT HEART CATH AND CORONARY ANGIOGRAPHY;  Surgeon: Alwyn Pea, MD;  Location: [facility] INVASIVE CV LAB;  Service: Cardiovascular;  Laterality: N/A;   ROTATOR CUFF REPAIR Right    spinal neurostimulator      Family Psychiatric History: Reviewed family psychiatric history from progress note on 08/16/2017.  Family History:  Family History  Problem Relation Age of Onset   Diabetes Mother    Hyperlipidemia Mother    Hypertension Mother    COPD Mother    CVA Mother    Anemia Mother    Diabetes Father    Hyperlipidemia Father    Hypertension Father    Thyroid disease Father    Alcohol abuse Father    Peripheral vascular disease Sister    Hypertension Sister    Anxiety disorder Son    Depression Son    Bipolar disorder Son    Seizures Son     Social History: Reviewed social history from progress note on 08/16/2017. Social History   Socioeconomic History   Marital status: Divorced    Spouse name: Not on file   Number of children: 1   Years of education: Not on file   Highest education level: High school graduate  Occupational History    Comment: disablitiy  Tobacco Use   Smoking status: Never   Smokeless tobacco: Never  Vaping Use   Vaping Use: Never used  Substance and Sexual Activity   Alcohol use: No   Drug use: No   Sexual activity: Not Currently  Other Topics Concern   Not on file  Social History Narrative   Not on file   Social Determinants of Health   Financial Resource Strain: Low Risk  (07/19/2017)   Overall Financial Resource Strain (CARDIA)    Difficulty of Paying Living Expenses: Not hard at all  Food Insecurity: No Food Insecurity (07/19/2017)   Hunger Vital Sign    Worried About Running Out of Food in the Last Year: Never true    Ran Out of Food in the Last Year: Never true  Transportation  Needs: No Transportation Needs (07/19/2017)   PRAPARE - Administrator, Civil Service (Medical): No    Lack of Transportation (Non-Medical): No  Physical Activity: Inactive (07/19/2017)   Exercise Vital Sign    Days of Exercise per Week: 0 days    Minutes of Exercise per Session: 0 min  Stress: Stress Concern Present (07/19/2017)   [employer] of Occupational Health - Occupational Stress Questionnaire    Feeling of Stress : Very much  Social Connections: Moderately Isolated (07/19/2017)   Social Connection and Isolation Panel [NHANES]    Frequency of Communication with Friends and Family: Once a week    Frequency of Social Gatherings  with Friends and Family: Never    Attends Religious Services: More than 4 times per year    Active Member of Clubs or Organizations: No    Attends Banker Meetings: Never    Marital Status: Divorced    Allergies:  Allergies  Allergen Reactions   Amoxapine Itching   Amoxicillin Itching   Dapagliflozin Itching    Other reaction(s): Other (See Comments) Thrush & vaginal itching   Keflex [Cephalexin] Itching   Mirtazapine Other (See Comments)    Pt states felling "like my body is outside of me."    Metabolic Disorder Labs: Lab Results  Component Value Date   HGBA1C 9.9 (H) 06/21/2017   MPG 237.43 06/21/2017   Lab Results  Component Value Date   PROLACTIN 4.3 (L) 05/26/2021   PROLACTIN 5.5 09/06/2017   Lab Results  Component Value Date   CHOL 157 05/26/2021   TRIG 245 (H) 05/26/2021   HDL 32 (L) 05/26/2021   CHOLHDL 4.9 (H) 05/26/2021   VLDL 39 06/21/2017   LDLCALC 84 05/26/2021   LDLCALC 174 (H) 06/21/2017   Lab Results  Component Value Date   TSH 2.890 09/06/2017   TSH 3.176 01/04/2017    Therapeutic Level Labs: No results found for: "LITHIUM" No results found for: "VALPROATE" No results found for: "CBMZ"  Current Medications: Current Outpatient Medications  Medication Sig Dispense Refill    ACCU-CHEK AVIVA PLUS test strip      ACCU-CHEK SOFTCLIX LANCETS lancets      Acetaminophen (TYLENOL ARTHRITIS EXT RELIEF PO) Take 600 mg by mouth daily.     albuterol (VENTOLIN HFA) 108 (90 Base) MCG/ACT inhaler Inhale 2 puffs into the lungs every 6 (six) hours as needed for wheezing or shortness of breath. 1 g 0   atorvastatin (LIPITOR) 80 MG tablet Take 80 mg by mouth daily at 6 PM.     Blood Glucose Monitoring Suppl (ACCU-CHEK AVIVA PLUS) w/Device KIT      brexpiprazole (REXULTI) 0.25 MG TABS tablet Take 1 tablet (0.25 mg total) by mouth daily. 30 tablet 1   Carboxymethylcellulose Sod PF 0.5 % SOLN Apply 1 drop to eye daily as needed.     Cholecalciferol (VITAMIN D3) 1000 units CAPS Take 1 capsule by mouth daily.     clobetasol ointment (TEMOVATE) 0.05 % Apply topically.     Continuous Blood Gluc Receiver (DEXCOM G6 RECEIVER) DEVI Use to monitor blood sugar. NDC [phone]     Continuous Blood Gluc Sensor (DEXCOM G6 SENSOR) MISC Use to monitor blood sugar.  Replace every 10 days. NDC [phone].     Continuous Blood Gluc Transmit (DEXCOM G6 TRANSMITTER) MISC Use to monitor blood sugar.  Replace every 3 months. NDC [phone].     cyclobenzaprine (FLEXERIL) 5 MG tablet Take 1 tablet by mouth 3 (three) times daily as needed.     diltiazem (CARDIZEM CD) 240 MG 24 hr capsule Take by mouth.     esomeprazole (NEXIUM) 40 MG capsule Take 40 mg by mouth daily.     ezetimibe (ZETIA) 10 MG tablet Take 10 mg by mouth at bedtime.     famotidine (PEPCID) 40 MG tablet Take 40 mg by mouth at bedtime.     ferrous sulfate 325 (65 FE) MG tablet Take by mouth. Take 325 mg daily with breakfast     fluticasone (FLONASE) 50 MCG/ACT nasal spray Place 2 sprays into both nostrils as needed.      furosemide (LASIX) 40 MG tablet TAKE (1) TABLET  BY MOUTH EVERY DAY     HUMALOG KWIKPEN 100 UNIT/ML KwikPen SMARTSIG:0-50 Unit(s) SUB-Q Daily     insulin degludec (TRESIBA FLEXTOUCH) 200 UNIT/ML FlexTouch Pen Inject  into the skin.     Insulin Lispro w/ Trans Port 100 UNIT/ML SOPN INJECT UP TO 50 UNITS DAILY DIVIDED DOSES, AS DIRECTED     Insulin Pen Needle (TRUEPLUS PEN NEEDLES) 29G X MISC      levothyroxine (SYNTHROID) 112 MCG tablet Take 112 mcg by mouth daily.     levothyroxine (SYNTHROID, LEVOTHROID) 100 MCG tablet Take 100 mcg by mouth daily.     lidocaine 4 % 1 patch daily.     magnesium oxide (MAG-OX) 400 MG tablet Take 1 tablet by mouth daily.     Melatonin 10 MG TABS Take 2 tablets by mouth at bedtime.     meloxicam (MOBIC) 15 MG tablet Take 15 mg by mouth daily.     metFORMIN (GLUCOPHAGE-XR) 500 MG 24 hr tablet Take 500 mg by mouth 2 (two) times daily.      metoprolol succinate (TOPROL-XL) 25 MG 24 hr tablet Take 1 tablet by mouth daily.     mometasone (ELOCON) 0.1 % cream Apply topically.     naloxone (NARCAN) nasal spray 4 mg/0.1 mL Place into the nose.     nystatin (MYCOSTATIN) 100000 UNIT/ML suspension Take 5 mLs by mouth 4 (four) times daily.     omeprazole (PRILOSEC) 20 MG capsule TAKE (1) CAPSULE BY MOUTH EVERY DAY 30 capsule 0   ondansetron (ZOFRAN) 4 MG tablet Take 4 mg by mouth every 8 (eight) hours as needed.     Riboflavin 400 MG TABS Take 1 tablet by mouth every night  for headaches     RYBELSUS 7 MG TABS Take 1 tablet by mouth daily.     TOUJEO SOLOSTAR 300 UNIT/ML Solostar Pen SMARTSIG:76 Unit(s) SUB-Q Every Night     vitamin B-12 (CYANOCOBALAMIN) 100 MCG tablet Take 1 tablet (100 mcg total) by mouth daily. 90 tablet 1   XTAMPZA ER 9 MG C12A PLEASE SEE ATTACHED FOR DETAILED DIRECTIONS     zonisamide (ZONEGRAN) 100 MG capsule Take 100 mg by mouth daily.     dabigatran (PRADAXA) 150 MG CAPS capsule Take 150 mg by mouth 2 (two) times daily.     DULoxetine (CYMBALTA) 60 MG capsule Take 1 capsule (60 mg total) by mouth 2 (two) times daily. 180 capsule 0   gabapentin (NEURONTIN) 600 MG tablet Take 1 tablet (600 mg total) by mouth daily. Dose change 90 tablet 0   hydrOXYzine  (VISTARIL) 25 MG capsule Take 1-2 capsules (25-50 mg total) by mouth 2 (two) times daily as needed for anxiety (and sleep). 120 capsule 1   Insulin Disposable Pump (OMNIPOD 5 G6 INTRO, GEN 5,) KIT SMARTSIG:1 SUB-Q Every 3 Days     lamoTRIgine (LAMICTAL) 100 MG tablet Take 100 mg by mouth daily. (Patient not taking: Reported on 09/15/2022)     nitroGLYCERIN (NITROSTAT) 0.4 MG SL tablet Place under the tongue.     pioglitazone (ACTOS) 15 MG tablet Take by mouth. Take 1 tablet by mouth once daily     Suvorexant (BELSOMRA) 20 MG TABS Take 1 tablet (20 mg total) by mouth at bedtime. 30 tablet 2   No current facility-administered medications for this visit.   Facility-Administered Medications Ordered in Other Visits  Medication Dose Route Frequency Provider Last Rate Last Admin   iohexol (OMNIPAQUE) 300 MG/ML solution    PRN Callwood,  Bobbie Stack, MD   60 mL at 03/10/22 1442     Musculoskeletal: Strength & Muscle Tone: within normal limits Gait & Station: normal Patient leans: N/A  Psychiatric Specialty Exam: Review of Systems  Psychiatric/Behavioral:  Positive for dysphoric mood. The patient is nervous/anxious.   All other systems reviewed and are negative.   Blood pressure 112/74, pulse 81, temperature 97.8 F (36.6 C), temperature source Skin, height 5\' 6"  (1.676 m), weight 240 lb 3.2 oz (109 kg).Body mass index is 38.77 kg/m.  General Appearance: Casual  Eye Contact:  Good  Speech:  Clear and Coherent  Volume:  Normal  Mood:  Anxious and Depressed  Affect:  Appropriate  Thought Process:  Goal Directed and Descriptions of Associations: Intact  Orientation:  Full (Time, Place, and Person)  Thought Content: Logical   Suicidal Thoughts:  No  Homicidal Thoughts:  No  Memory:  Immediate;   Fair Recent;   Fair Remote;   Fair  Judgement:  Fair  Insight:  Fair  Psychomotor Activity:  Normal  Concentration:  Concentration: Fair and Attention Span: Fair  Recall:  [employer] of  Knowledge: Fair  Language: Fair  Akathisia:  No  Handed:  Right  AIMS (if indicated): done  Assets:  Communication Skills Desire for Improvement Housing  ADL's:  Intact  Cognition: WNL  Sleep:  Fair   Screenings: Geneticist, molecular Office Visit from 09/15/2022 in [facility] Video Visit from 02/04/2022 in [facility] Office Visit from 12/30/2021 in [facility] Office Visit from 11/18/2021 in [facility] Video Visit from 09/03/2021 in [facility]  AIMS Total Score 0 0 0 0 3      GAD-7    Flowsheet Row Office Visit from 09/15/2022 in [facility] Counselor from 06/23/2022 in [facility] Counselor from 05/18/2022 in [facility] Counselor from 05/04/2022 in [facility] Office Visit from 04/20/2022 in [facility]  Total GAD-7 Score 12 10 18 16 19       PHQ2-9    Flowsheet Row Office Visit from 09/15/2022 in [facility] Counselor from 06/23/2022 in [facility] Counselor from 06/10/2022 in [facility] Counselor from 05/18/2022 in [facility] Office Visit from 04/20/2022 in [facility]  PHQ-2 Total Score 3 2 5 5 5   PHQ-9 Total Score 5 4 16 12 15       Flowsheet Row Office Visit from 09/15/2022 in [facility] Video Visit from 07/09/2022 in [facility] Counselor from 06/23/2022 in [facility]  C-SSRS RISK CATEGORY Low Risk Low Risk No Risk        Assessment and Plan: Christine Mcneil is a 57 year old Caucasian female who has a history of MDD, PTSD, multiple medical problems was evaluated in office today.  Patient is currently improving, however will benefit from psychotherapy sessions as well as medication management, will benefit from the following plan.  Plan MDD-in partial remission Cymbalta 120 mg p.o. daily in divided dosage Rexulti 0.25 mg p.o. daily Gabapentin 600 mg p.o. daily-although prescribed for pain is a mood stabilizer.  GAD-improving Hydroxyzine 25-50 mg  p.o. twice daily as needed Cymbalta 120 mg p.o. daily Patient provided resources in the community to get established with a new therapist.  PTSD-improving Patient provided resources in the community to get established with a new therapist to continue CBT  Insomnia-improving Belsomra 20 mg p.o. nightly Reviewed [place] PMP AWARxE  Follow-up in clinic in 2 months or sooner if needed.  Collaboration of Care: Collaboration of Care: Referral or follow-up with counselor/therapist AEB patient provided resources in the community to get established with a therapist.  Patient/Guardian was advised Release of Information must be obtained prior to any record release in order to collaborate their care with an outside provider. Patient/Guardian was advised if they have not already done so to contact the registration department to sign all necessary forms in order for [name] to release information regarding their care.   Consent: Patient/Guardian gives verbal consent for treatment and assignment of benefits for services provided during this visit. Patient/Guardian expressed understanding and agreed to proceed.   This note was generated in part or whole with voice recognition software. Voice recognition is usually quite accurate but there are transcription errors that can and very often do occur. I apologize for any  typographical errors that were not detected and corrected.    Jomarie Longs, MD 09/17/2022, 10:24 AM

## 2022-09-15 NOTE — Patient Instructions (Signed)
  [email or web address]  www.psychologytoday  [employer] [email or web address] [address], [place], [place] [address]  [phone]  [facility], [facility] [email or web address] [address], [place], [place] [address]   [phone]   [facility] - [phone]  [facility] - [phone]  [facility] - [phone] counseling [phone]  [facility] [phone]   [email or web address] this clinician can offer telehealth and has a sliding scale option  [email or web address] this group also offers sliding scale rates and is based out of [place]  Dr. Liborio Nixon with the [facility] specializes in divorce  [employer] [facility] has interns who offer sliding scale rates and some of the full time clinicians do, as well. You complete their contact form on their website and the referrals coordinator will help to get connected to someone

## 2022-09-16 ENCOUNTER — Other Ambulatory Visit: Payer: Self-pay | Admitting: Psychiatry

## 2022-09-16 DIAGNOSIS — F411 Generalized anxiety disorder: Secondary | ICD-10-CM

## 2022-09-16 DIAGNOSIS — F4310 Post-traumatic stress disorder, unspecified: Secondary | ICD-10-CM

## 2022-09-18 ENCOUNTER — Other Ambulatory Visit: Payer: Self-pay | Admitting: Psychiatry

## 2022-09-18 DIAGNOSIS — F411 Generalized anxiety disorder: Secondary | ICD-10-CM

## 2022-09-18 DIAGNOSIS — F4310 Post-traumatic stress disorder, unspecified: Secondary | ICD-10-CM

## 2022-09-21 ENCOUNTER — Encounter: Payer: Self-pay | Admitting: Oncology

## 2022-09-21 ENCOUNTER — Inpatient Hospital Stay: Payer: 59

## 2022-09-21 ENCOUNTER — Inpatient Hospital Stay: Payer: 59 | Admitting: Oncology

## 2022-09-21 DIAGNOSIS — D509 Iron deficiency anemia, unspecified: Secondary | ICD-10-CM | POA: Insufficient documentation

## 2022-09-21 LAB — CBC WITH DIFFERENTIAL/PLATELET
Abs Immature Granulocytes: 0.01 10*3/uL (ref 0.00–0.07)
Basophils Absolute: 0 10*3/uL (ref 0.0–0.1)
Basophils Relative: 0 %
Eosinophils Absolute: 0.2 10*3/uL (ref 0.0–0.5)
Eosinophils Relative: 3 %
HCT: 39.6 % (ref 36.0–46.0)
Hemoglobin: 13.2 g/dL (ref 12.0–15.0)
Immature Granulocytes: 0 %
Lymphocytes Relative: 45 %
Lymphs Abs: 3.1 10*3/uL (ref 0.7–4.0)
MCH: 29.3 pg (ref 26.0–34.0)
MCHC: 33.3 g/dL (ref 30.0–36.0)
MCV: 88 fL (ref 80.0–100.0)
Monocytes Absolute: 0.6 10*3/uL (ref 0.1–1.0)
Monocytes Relative: 9 %
Neutro Abs: 2.9 10*3/uL (ref 1.7–7.7)
Neutrophils Relative %: 43 %
Platelets: 215 10*3/uL (ref 150–400)
RBC: 4.5 MIL/uL (ref 3.87–5.11)
RDW: 11.9 % (ref 11.5–15.5)
WBC: 6.8 10*3/uL (ref 4.0–10.5)
nRBC: 0 % (ref 0.0–0.2)

## 2022-09-21 LAB — IRON AND TIBC
Iron: 97 ug/dL (ref 28–170)
Saturation Ratios: 21 % (ref 10.4–31.8)
TIBC: 456 ug/dL — ABNORMAL HIGH (ref 250–450)
UIBC: 359 ug/dL

## 2022-09-21 LAB — FERRITIN: Ferritin: 57 ng/mL (ref 11–307)

## 2022-09-25 ENCOUNTER — Other Ambulatory Visit: Payer: Self-pay | Admitting: Psychiatry

## 2022-09-25 DIAGNOSIS — F330 Major depressive disorder, recurrent, mild: Secondary | ICD-10-CM

## 2022-09-27 ENCOUNTER — Ambulatory Visit: Payer: 59 | Admitting: Pulmonary Disease

## 2022-09-29 ENCOUNTER — Inpatient Hospital Stay (HOSPITAL_BASED_OUTPATIENT_CLINIC_OR_DEPARTMENT_OTHER): Payer: 59 | Admitting: Oncology

## 2022-09-29 ENCOUNTER — Encounter: Payer: Self-pay | Admitting: Oncology

## 2022-09-29 ENCOUNTER — Other Ambulatory Visit: Payer: Self-pay

## 2022-09-29 ENCOUNTER — Inpatient Hospital Stay: Payer: 59 | Attending: Oncology

## 2022-09-29 VITALS — BP 113/83 | HR 89 | Temp 96.2°F | Resp 18 | Ht 66.0 in | Wt 240.0 lb

## 2022-09-29 DIAGNOSIS — Z8673 Personal history of transient ischemic attack (TIA), and cerebral infarction without residual deficits: Secondary | ICD-10-CM | POA: Diagnosis not present

## 2022-09-29 DIAGNOSIS — D6852 Prothrombin gene mutation: Secondary | ICD-10-CM | POA: Diagnosis not present

## 2022-09-29 DIAGNOSIS — D509 Iron deficiency anemia, unspecified: Secondary | ICD-10-CM

## 2022-09-29 DIAGNOSIS — D508 Other iron deficiency anemias: Secondary | ICD-10-CM

## 2022-09-29 DIAGNOSIS — Z86718 Personal history of other venous thrombosis and embolism: Secondary | ICD-10-CM | POA: Insufficient documentation

## 2022-09-29 LAB — CBC WITH DIFFERENTIAL (CANCER CENTER ONLY)
Abs Immature Granulocytes: 0.02 10*3/uL (ref 0.00–0.07)
Basophils Absolute: 0 10*3/uL (ref 0.0–0.1)
Basophils Relative: 0 %
Eosinophils Absolute: 0.1 10*3/uL (ref 0.0–0.5)
Eosinophils Relative: 1 %
HCT: 41 % (ref 36.0–46.0)
Hemoglobin: 13.7 g/dL (ref 12.0–15.0)
Immature Granulocytes: 0 %
Lymphocytes Relative: 38 %
Lymphs Abs: 2.7 10*3/uL (ref 0.7–4.0)
MCH: 29.7 pg (ref 26.0–34.0)
MCHC: 33.4 g/dL (ref 30.0–36.0)
MCV: 88.7 fL (ref 80.0–100.0)
Monocytes Absolute: 0.6 10*3/uL (ref 0.1–1.0)
Monocytes Relative: 8 %
Neutro Abs: 3.7 10*3/uL (ref 1.7–7.7)
Neutrophils Relative %: 53 %
Platelet Count: 184 10*3/uL (ref 150–400)
RBC: 4.62 MIL/uL (ref 3.87–5.11)
RDW: 11.9 % (ref 11.5–15.5)
WBC Count: 7.1 10*3/uL (ref 4.0–10.5)
nRBC: 0 % (ref 0.0–0.2)

## 2022-09-29 NOTE — Progress Notes (Signed)
Hematology/Oncology Consult note [facility]  [phone] [phone]  Patient Care Team: Doughton, Margit Banda, MD as PCP - General (Student) Midge Minium, MD as Consulting Physician (Gastroenterology)   Name of the patient: Christine Mcneil  [id number]  Aug 10, 1965   Date of visit: 09/29/22  Diagnosis-iron deficiency anemia  Chief complaint/ Reason for visit-routine follow-up of iron deficiency anemia  Heme/Onc history: Patient is a 57 year old female is a transfer of care from Dr. Merlene Pulling for history of iron deficiency anemia which has been followed since 2019.EGD on 03/21/2018 was normal.  Colonoscopy on 03/21/2018 revealed for 1 to 2 mm polyps in the sigmoid colon and internal hemorrhoids.  Pathology revealed hyperplastic polyps.  Patient also had capsule study in May 2021 which was inconclusive as it did not enter the small bowel.   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  She has a prothrombin gene mutation.  She is on long term anticoagulation with Pradaxa.   Interval history-patient is doing well presently.  Denies any bloodLoss in her stool or urine.  Denies any dark melanotic stools.  Energy levels have remained stable.  She has not had any issues tolerating Pradaxa.  ECOG PS- 1 Pain scale- 0   Review of systems- Review of Systems  Constitutional:  Negative for chills, fever, malaise/fatigue and weight loss.  HENT:  Negative for congestion, ear discharge and nosebleeds.   Eyes:  Negative for blurred vision.  Respiratory:  Negative for cough, hemoptysis, sputum production, shortness of breath and wheezing.   Cardiovascular:  Negative for chest pain, palpitations, orthopnea and claudication.  Gastrointestinal:  Negative for abdominal pain, blood in stool, constipation, diarrhea, heartburn, melena, nausea and vomiting.  Genitourinary:  Negative for dysuria, flank pain, frequency, hematuria and urgency.   Musculoskeletal:  Negative for back pain, joint pain and myalgias.  Skin:  Negative for rash.  Neurological:  Negative for dizziness, tingling, focal weakness, seizures, weakness and headaches.  Endo/Heme/Allergies:  Does not bruise/bleed easily.  Psychiatric/Behavioral:  Negative for depression and suicidal ideas. The patient does not have insomnia.       Allergies  Allergen Reactions   Amoxapine Itching   Amoxicillin Itching   Dapagliflozin Itching    Other reaction(s): Other (See Comments) Thrush & vaginal itching   Keflex [Cephalexin] Itching   Mirtazapine Other (See Comments)    Pt states felling "like my body is outside of me."     Past Medical History:  Diagnosis Date   Abdominal pain    Anxiety and depression    Atypical facial pain    Bilateral occipital neuralgia    Cervical spondylosis without myelopathy    Chronic daily headache    Chronic pain    Chronic pelvic pain in female    Chronic thoracic back pain    Depression    Diabetes mellitus without complication (HCC)    Diabetes mellitus, type II (HCC)    Dysphagia    Dysrhythmia    Fibromyalgia    Hyperlipidemia    Hypertension    Hypothyroidism    Insomnia    Iron deficiency anemia    Left hip pain    Low back pain    Migraines    Numbness    Optic neuropathy    OSA (obstructive sleep apnea)    Polyarthralgia    Primary osteoarthritis of both hips    Prothrombin gene mutation (HCC)    Pseudotumor cerebri    Rectal bleeding  Right shoulder pain    Rotator cuff syndrome    Stroke [facility])    Thyroid disease    TIA (transient ischemic attack) 05/27/2017     Past Surgical History:  Procedure Laterality Date   ABDOMINAL HYSTERECTOMY     total   CHOLECYSTECTOMY     COLONOSCOPY WITH PROPOFOL N/A 03/21/2018   Procedure: COLONOSCOPY WITH PROPOFOL;  Surgeon: Midge Minium, MD;  Location: [facility] ENDOSCOPY;  Service: Endoscopy;  Laterality: N/A;   ERCP N/A 08/19/2020   Procedure: ENDOSCOPIC  RETROGRADE CHOLANGIOPANCREATOGRAPHY (ERCP);  Surgeon: Midge Minium, MD;  Location: [facility] ENDOSCOPY;  Service: Endoscopy;  Laterality: N/A;   ESOPHAGOGASTRODUODENOSCOPY (EGD) WITH PROPOFOL N/A 03/21/2018   Procedure: ESOPHAGOGASTRODUODENOSCOPY (EGD) WITH PROPOFOL;  Surgeon: Midge Minium, MD;  Location: [facility] ENDOSCOPY;  Service: Endoscopy;  Laterality: N/A;   GIVENS CAPSULE STUDY N/A 09/24/2019   Procedure: GIVENS CAPSULE STUDY;  Surgeon: Pasty Spillers, MD;  Location: [facility] ENDOSCOPY;  Service: Endoscopy;  Laterality: N/A;   KNEE SURGERY Left    LEFT HEART CATH AND CORONARY ANGIOGRAPHY N/A 03/10/2022   Procedure: LEFT HEART CATH AND CORONARY ANGIOGRAPHY;  Surgeon: Alwyn Pea, MD;  Location: [facility] INVASIVE CV LAB;  Service: Cardiovascular;  Laterality: N/A;   ROTATOR CUFF REPAIR Right    spinal neurostimulator      Social History   Socioeconomic History   Marital status: Divorced    Spouse name: Not on file   Number of children: 1   Years of education: Not on file   Highest education level: High school graduate  Occupational History    Comment: disablitiy  Tobacco Use   Smoking status: Never   Smokeless tobacco: Never  Vaping Use   Vaping Use: Never used  Substance and Sexual Activity   Alcohol use: No   Drug use: No   Sexual activity: Not Currently  Other Topics Concern   Not on file  Social History Narrative   Not on file   Social Determinants of Health   Financial Resource Strain: Low Risk  (07/19/2017)   Overall Financial Resource Strain (CARDIA)    Difficulty of Paying Living Expenses: Not hard at all  Food Insecurity: No Food Insecurity (07/19/2017)   Hunger Vital Sign    Worried About Running Out of Food in the Last Year: Never true    Ran Out of Food in the Last Year: Never true  Transportation Needs: No Transportation Needs (07/19/2017)   PRAPARE - Administrator, Civil Service (Medical): No    Lack of Transportation (Non-Medical): No   Physical Activity: Inactive (07/19/2017)   Exercise Vital Sign    Days of Exercise per Week: 0 days    Minutes of Exercise per Session: 0 min  Stress: Stress Concern Present (07/19/2017)   [employer] of Occupational Health - Occupational Stress Questionnaire    Feeling of Stress : Very much  Social Connections: Moderately Isolated (07/19/2017)   Social Connection and Isolation Panel [NHANES]    Frequency of Communication with Friends and Family: Once a week    Frequency of Social Gatherings with Friends and Family: Never    Attends Religious Services: More than 4 times per year    Active Member of [employer] or Organizations: No    Attends Banker Meetings: Never    Marital Status: Divorced  Catering manager Violence: Not At Risk (07/19/2017)   Humiliation, Afraid, Rape, and Kick questionnaire    Fear of Current or Ex-Partner: No    Emotionally  Abused: No    Physically Abused: No    Sexually Abused: No    Family History  Problem Relation Age of Onset   Diabetes Mother    Hyperlipidemia Mother    Hypertension Mother    COPD Mother    CVA Mother    Anemia Mother    Diabetes Father    Hyperlipidemia Father    Hypertension Father    Thyroid disease Father    Alcohol abuse Father    Peripheral vascular disease Sister    Hypertension Sister    Anxiety disorder Son    Depression Son    Bipolar disorder Son    Seizures Son      Current Outpatient Medications:    ACCU-CHEK AVIVA PLUS test strip, , Disp: , Rfl:    ACCU-CHEK SOFTCLIX LANCETS lancets, , Disp: , Rfl:    Acetaminophen (TYLENOL ARTHRITIS EXT RELIEF PO), Take 600 mg by mouth daily., Disp: , Rfl:    albuterol (VENTOLIN HFA) 108 (90 Base) MCG/ACT inhaler, Inhale 2 puffs into the lungs every 6 (six) hours as needed for wheezing or shortness of breath., Disp: 1 g, Rfl: 0   atorvastatin (LIPITOR) 80 MG tablet, Take 80 mg by mouth daily at 6 PM., Disp: , Rfl:    Blood Glucose Monitoring Suppl (ACCU-CHEK  AVIVA PLUS) w/Device KIT, , Disp: , Rfl:    brexpiprazole (REXULTI) 0.25 MG TABS tablet, Take 1 tablet (0.25 mg total) by mouth daily., Disp: 30 tablet, Rfl: 1   Carboxymethylcellulose Sod PF 0.5 % SOLN, Apply 1 drop to eye daily as needed., Disp: , Rfl:    Cholecalciferol (VITAMIN D3) 1000 units CAPS, Take 1 capsule by mouth daily., Disp: , Rfl:    clobetasol ointment (TEMOVATE) 0.05 %, Apply topically., Disp: , Rfl:    Continuous Blood Gluc Receiver (DEXCOM G6 RECEIVER) DEVI, Use to monitor blood sugar. NDC [phone], Disp: , Rfl:    Continuous Blood Gluc Sensor (DEXCOM G6 SENSOR) MISC, Use to monitor blood sugar.  Replace every 10 days. NDC [phone]., Disp: , Rfl:    Continuous Blood Gluc Transmit (DEXCOM G6 TRANSMITTER) MISC, Use to monitor blood sugar.  Replace every 3 months. [facility] [phone]., Disp: , Rfl:    cyclobenzaprine (FLEXERIL) 5 MG tablet, Take 1 tablet by mouth 3 (three) times daily as needed., Disp: , Rfl:    DILT-XR 240 MG 24 hr capsule, Take 240 mg by mouth daily., Disp: , Rfl:    DULoxetine (CYMBALTA) 60 MG capsule, Take 1 capsule (60 mg total) by mouth 2 (two) times daily., Disp: 180 capsule, Rfl: 0   esomeprazole (NEXIUM) 40 MG capsule, Take 40 mg by mouth daily., Disp: , Rfl:    ezetimibe (ZETIA) 10 MG tablet, Take 10 mg by mouth at bedtime., Disp: , Rfl:    famotidine (PEPCID) 40 MG tablet, Take 40 mg by mouth at bedtime., Disp: , Rfl:    ferrous sulfate 325 (65 FE) MG tablet, Take by mouth. Take 325 mg daily with breakfast, Disp: , Rfl:    fluticasone (FLONASE) 50 MCG/ACT nasal spray, Place 2 sprays into both nostrils as needed. , Disp: , Rfl:    furosemide (LASIX) 40 MG tablet, TAKE (1) TABLET BY MOUTH EVERY DAY, Disp: , Rfl:    gabapentin (NEURONTIN) 600 MG tablet, Take 1 tablet (600 mg total) by mouth daily. Dose change, Disp: 90 tablet, Rfl: 0   HUMALOG KWIKPEN 100 UNIT/ML KwikPen, SMARTSIG:0-50 Unit(s) SUB-Q Daily, Disp: , Rfl:    hydrOXYzine (  VISTARIL) 25  MG capsule, Take 1-2 capsules (25-50 mg total) by mouth 2 (two) times daily as needed for anxiety (and sleep)., Disp: 120 capsule, Rfl: 1   insulin degludec (TRESIBA FLEXTOUCH) 200 UNIT/ML FlexTouch Pen, Inject into the skin., Disp: , Rfl:    Insulin Disposable Pump (OMNIPOD 5 G6 INTRO, GEN 5,) KIT, SMARTSIG:1 SUB-Q Every 3 Days, Disp: , Rfl:    Insulin Lispro w/ Trans Port 100 UNIT/ML SOPN, INJECT UP TO 50 UNITS DAILY DIVIDED DOSES, AS DIRECTED, Disp: , Rfl:    Insulin Pen Needle (TRUEPLUS PEN NEEDLES) 29G X MISC, , Disp: , Rfl:    levothyroxine (SYNTHROID) 112 MCG tablet, Take 112 mcg by mouth daily., Disp: , Rfl:    levothyroxine (SYNTHROID, LEVOTHROID) 100 MCG tablet, Take 100 mcg by mouth daily., Disp: , Rfl:    lidocaine 4 %, 1 patch daily., Disp: , Rfl:    magnesium oxide (MAG-OX) 400 MG tablet, Take 1 tablet by mouth daily., Disp: , Rfl:    Melatonin 10 MG TABS, Take 2 tablets by mouth at bedtime., Disp: , Rfl:    meloxicam (MOBIC) 15 MG tablet, Take 15 mg by mouth daily., Disp: , Rfl:    metFORMIN (GLUCOPHAGE-XR) 500 MG 24 hr tablet, Take 500 mg by mouth 2 (two) times daily. , Disp: , Rfl:    metoprolol succinate (TOPROL-XL) 25 MG 24 hr tablet, Take 1 tablet by mouth daily., Disp: , Rfl:    mometasone (ELOCON) 0.1 % cream, Apply topically., Disp: , Rfl:    naloxone (NARCAN) nasal spray 4 mg/0.1 mL, Place into the nose., Disp: , Rfl:    nystatin (MYCOSTATIN) 100000 UNIT/ML suspension, Take 5 mLs by mouth 4 (four) times daily., Disp: , Rfl:    omeprazole (PRILOSEC) 20 MG capsule, TAKE (1) CAPSULE BY MOUTH EVERY DAY, Disp: 30 capsule, Rfl: 0   ondansetron (ZOFRAN) 4 MG tablet, Take 4 mg by mouth every 8 (eight) hours as needed., Disp: , Rfl:    Riboflavin 400 MG TABS, Take 1 tablet by mouth every night  for headaches, Disp: , Rfl:    RYBELSUS 7 MG TABS, Take 1 tablet by mouth daily., Disp: , Rfl:    Suvorexant (BELSOMRA) 20 MG TABS, Take 1 tablet (20 mg total) by mouth at bedtime.,  Disp: 30 tablet, Rfl: 2   TOUJEO SOLOSTAR 300 UNIT/ML Solostar Pen, SMARTSIG:76 Unit(s) SUB-Q Every Night, Disp: , Rfl:    vitamin B-12 (CYANOCOBALAMIN) 100 MCG tablet, Take 1 tablet (100 mcg total) by mouth daily., Disp: 90 tablet, Rfl: 1   XTAMPZA ER 9 MG C12A, PLEASE SEE ATTACHED FOR DETAILED DIRECTIONS, Disp: , Rfl:    zonisamide (ZONEGRAN) 100 MG capsule, Take 100 mg by mouth daily., Disp: , Rfl:    dabigatran (PRADAXA) 150 MG CAPS capsule, Take 150 mg by mouth 2 (two) times daily., Disp: , Rfl:    nitroGLYCERIN (NITROSTAT) 0.4 MG SL tablet, Place under the tongue., Disp: , Rfl:    pioglitazone (ACTOS) 15 MG tablet, Take by mouth. Take 1 tablet by mouth once daily, Disp: , Rfl:  No current facility-administered medications for this visit.  Facility-Administered Medications Ordered in Other Visits:    iohexol (OMNIPAQUE) 300 MG/ML solution, , , PRN, Callwood, Dwayne D, MD, 60 mL at 03/10/22 1442  Physical exam:  Vitals:   09/29/22 1410  BP: 113/83  Pulse: 89  Resp: 18  Temp: (!) 96.2 F (35.7 C)  TempSrc: Tympanic  SpO2: 97%  Weight: 240 lb (  108.9 kg)  Height: 5\' 6"  (1.676 m)   Physical Exam Cardiovascular:     Rate and Rhythm: Normal rate and regular rhythm.     Heart sounds: Normal heart sounds.  Pulmonary:     Effort: Pulmonary effort is normal.     Breath sounds: Normal breath sounds.  Skin:    General: Skin is warm and dry.  Neurological:     Mental Status: She is alert and oriented to person, place, and time.         Latest Ref Rng & Units 03/01/2022    7:29 PM  CMP  Glucose 70 - 99 mg/dL 161   BUN 6 - 20 mg/dL 10   Creatinine 0.96 - 1.00 mg/dL 0.45   Sodium 409 - 811 mmol/L 137   Potassium 3.5 - 5.1 mmol/L 3.7   Chloride 98 - 111 mmol/L 99   CO2 22 - 32 mmol/L 24   Calcium 8.9 - 10.3 mg/dL 9.4   Total Protein 6.5 - 8.1 g/dL 7.8   Total Bilirubin 0.3 - 1.2 mg/dL 0.7   Alkaline Phos 38 - 126 U/L 76   AST 15 - 41 U/L 77   ALT 0 - 44 U/L 76        Latest Ref Rng & Units 09/29/2022    2:04 PM  CBC  WBC 4.0 - 10.5 K/uL 7.1   Hemoglobin 12.0 - 15.0 g/dL 91.4   Hematocrit 78.2 - 46.0 % 41.0   Platelets 150 - 400 K/uL 184      Assessment and plan- Patient is a 57 y.o. female here for routineFollow-up of iron deficiency anemia  Patient is not presently anemic with a hemoglobin of 13.7.  Her ferritin and iron studies were checked a week ago which shows iron saturation of 21% and a ferritin level of 57.  Although her TIBC is mildly elevated she does not require any IV iron at this time.  I will continue to monitor her CBC ferritin and iron studies in 4 and 8 months and see her back in 8 months   Visit Diagnosis 1. Other iron deficiency anemia      Dr. Owens Shark, MD, MPH [facility] [phone] 09/29/2022 3:32 PM

## 2022-10-15 ENCOUNTER — Other Ambulatory Visit: Payer: Self-pay | Admitting: Psychiatry

## 2022-10-15 DIAGNOSIS — F330 Major depressive disorder, recurrent, mild: Secondary | ICD-10-CM

## 2022-11-20 ENCOUNTER — Other Ambulatory Visit: Payer: Self-pay | Admitting: Psychiatry

## 2022-11-20 DIAGNOSIS — F330 Major depressive disorder, recurrent, mild: Secondary | ICD-10-CM

## 2022-11-22 ENCOUNTER — Other Ambulatory Visit: Payer: Self-pay | Admitting: Psychiatry

## 2022-11-22 ENCOUNTER — Telehealth: Payer: Self-pay | Admitting: Psychiatry

## 2022-11-22 ENCOUNTER — Ambulatory Visit: Payer: 59 | Admitting: Psychiatry

## 2022-11-22 DIAGNOSIS — F3341 Major depressive disorder, recurrent, in partial remission: Secondary | ICD-10-CM

## 2022-11-22 DIAGNOSIS — F411 Generalized anxiety disorder: Secondary | ICD-10-CM

## 2022-11-22 MED ORDER — HYDROXYZINE PAMOATE 25 MG PO CAPS
25.0000 mg | ORAL_CAPSULE | Freq: Two times a day (BID) | ORAL | 0 refills | Status: DC | PRN
Start: 2022-11-22 — End: 2022-11-23

## 2022-11-22 MED ORDER — BREXPIPRAZOLE 0.25 MG PO TABS: 0.2500 mg | ORAL_TABLET | Freq: Every day | ORAL | 0 refills | Status: DC

## 2022-11-22 NOTE — Telephone Encounter (Signed)
Patient called stating she is out of medication, Vistaril and Rexulti.  R[facility]ult[facility] has to be [facility] [place] and Vistaril sent to [facility] in [place].  Please advise

## 2022-11-22 NOTE — Telephone Encounter (Signed)
I've ordered a one-month supply. I will defer additional refills to Dr. Elna Breslow to last until her next appointmen

## 2022-11-23 MED ORDER — HYDROXYZINE PAMOATE 25 MG PO CAPS
25.0000 mg | ORAL_CAPSULE | Freq: Two times a day (BID) | ORAL | 0 refills | Status: DC | PRN
Start: 2022-11-23 — End: 2024-01-10

## 2022-11-23 MED ORDER — BREXPIPRAZOLE 0.25 MG PO TABS: 0.2500 mg | ORAL_TABLET | Freq: Every day | ORAL | 0 refills | Status: DC

## 2022-11-23 NOTE — Telephone Encounter (Signed)
I have sent 90 days supply of Rexulti and hydroxyzine to pharmacy

## 2022-11-24 ENCOUNTER — Telehealth: Payer: Self-pay

## 2022-11-24 NOTE — Telephone Encounter (Signed)
Initiated a PA for Rexulti 0.25 mg via CoverMyMeds received a message  This medication or product is on your plan's list of covered drugs. Prior authorization is not required at this time.   Called pharmacy to advise

## 2022-12-13 ENCOUNTER — Other Ambulatory Visit: Payer: Self-pay | Admitting: Psychiatry

## 2022-12-13 DIAGNOSIS — F4310 Post-traumatic stress disorder, unspecified: Secondary | ICD-10-CM

## 2022-12-13 DIAGNOSIS — F411 Generalized anxiety disorder: Secondary | ICD-10-CM

## 2022-12-15 ENCOUNTER — Encounter: Payer: Self-pay | Admitting: Intensive Care

## 2022-12-15 ENCOUNTER — Emergency Department
Admission: EM | Admit: 2022-12-15 | Discharge: 2022-12-15 | Disposition: A | Discharge status: Home / Self Care | Payer: 59 | Source: Home / Self Care | Attending: Emergency Medicine | Admitting: Emergency Medicine

## 2022-12-15 ENCOUNTER — Emergency Department: Payer: 59

## 2022-12-15 ENCOUNTER — Other Ambulatory Visit: Payer: Self-pay

## 2022-12-15 DIAGNOSIS — L03011 Cellulitis of right finger: Secondary | ICD-10-CM | POA: Diagnosis not present

## 2022-12-15 DIAGNOSIS — I10 Essential (primary) hypertension: Secondary | ICD-10-CM | POA: Insufficient documentation

## 2022-12-15 DIAGNOSIS — L03311 Cellulitis of abdominal wall: Secondary | ICD-10-CM | POA: Insufficient documentation

## 2022-12-15 DIAGNOSIS — E119 Type 2 diabetes mellitus without complications: Secondary | ICD-10-CM | POA: Insufficient documentation

## 2022-12-15 DIAGNOSIS — R1012 Left upper quadrant pain: Secondary | ICD-10-CM | POA: Diagnosis present

## 2022-12-15 LAB — COMPREHENSIVE METABOLIC PANEL
ALT: 33 U/L (ref 0–44)
AST: 32 U/L (ref 15–41)
Albumin: 3.9 g/dL (ref 3.5–5.0)
Alkaline Phosphatase: 79 U/L (ref 38–126)
Anion gap: 10 (ref 5–15)
BUN: 12 mg/dL (ref 6–20)
CO2: 24 mmol/L (ref 22–32)
Calcium: 9 mg/dL (ref 8.9–10.3)
Chloride: 98 mmol/L (ref 98–111)
Creatinine, Ser: 0.83 mg/dL (ref 0.44–1.00)
GFR, Estimated: >60 mL/min (ref >60)
Glucose, Bld: 251 mg/dL — ABNORMAL HIGH (ref 70–99)
Potassium: 4.6 mmol/L (ref 3.5–5.1)
Sodium: 132 mmol/L — ABNORMAL LOW (ref 135–145)
Total Bilirubin: 1.3 mg/dL — ABNORMAL HIGH (ref 0.3–1.2)
Total Protein: 7.7 g/dL (ref 6.5–8.1)

## 2022-12-15 LAB — CBC WITH DIFFERENTIAL/PLATELET
Abs Immature Granulocytes: 0.02 10*3/uL (ref 0.00–0.07)
Basophils Absolute: 0 10*3/uL (ref 0.0–0.1)
Basophils Relative: 0 %
Eosinophils Absolute: 0.1 10*3/uL (ref 0.0–0.5)
Eosinophils Relative: 1 %
HCT: 39.7 % (ref 36.0–46.0)
Hemoglobin: 13.1 g/dL (ref 12.0–15.0)
Immature Granulocytes: 0 %
Lymphocytes Relative: 17 %
Lymphs Abs: 1.6 10*3/uL (ref 0.7–4.0)
MCH: 29 pg (ref 26.0–34.0)
MCHC: 33 g/dL (ref 30.0–36.0)
MCV: 88 fL (ref 80.0–100.0)
Monocytes Absolute: 0.9 10*3/uL (ref 0.1–1.0)
Monocytes Relative: 9 %
Neutro Abs: 7 10*3/uL (ref 1.7–7.7)
Neutrophils Relative %: 73 %
Platelets: 199 10*3/uL (ref 150–400)
RBC: 4.51 MIL/uL (ref 3.87–5.11)
RDW: 12.3 % (ref 11.5–15.5)
WBC: 9.7 10*3/uL (ref 4.0–10.5)
nRBC: 0 % (ref 0.0–0.2)

## 2022-12-15 MED ORDER — LACTATED RINGERS IV BOLUS
1000.0000 mL | Freq: Once | INTRAVENOUS | Status: AC
  Administered 2022-12-15: 1000 mL via INTRAVENOUS

## 2022-12-15 MED ORDER — CLINDAMYCIN HCL 150 MG PO CAPS: 450.0000 mg | ORAL_CAPSULE | Freq: Three times a day (TID) | ORAL | 0 refills | Status: AC

## 2022-12-15 MED ORDER — MORPHINE SULFATE (PF) 4 MG/ML IV SOLN
4.0000 mg | Freq: Once | INTRAVENOUS | Status: AC
  Administered 2022-12-15: 4 mg via INTRAVENOUS
  Filled 2022-12-15: qty 1

## 2022-12-15 MED ORDER — IOHEXOL 300 MG/ML  SOLN
100.0000 mL | Freq: Once | INTRAMUSCULAR | Status: AC | PRN
  Administered 2022-12-15: 100 mL via INTRAVENOUS

## 2022-12-15 NOTE — ED Triage Notes (Signed)
Patient has large red, hardened area around insulin pump(omnipod) on abdomen X1 week. Aread hot to touch.   Right hand, middle finger also has redness and infection from biting finger nail per patient.

## 2022-12-15 NOTE — ED Provider Notes (Signed)
[facility] Provider Note    Event Date/Time   First MD Initiated Contact with Patient 12/15/22 0818     (approximate)   History   Chief Complaint Abscess and finger infection   HPI  Christine Mcneil is a 57 y.o. female with past medical history of hypertension, diabetes, atrial fibrillation, stroke, anemia, migraines, and chronic pain syndrome who presents to the ED complaining of abdominal pain.  Patient reports that she has had redness and swelling spreading from the left upper quadrant and epigastric area of her abdomen over the past week.  She is concerned that her OmniPod site has become infected, states her skin has been very sensitive to touch in this area.  She denies any fevers, was able to move her OmniPod to a different area on her abdomen.  She additionally reports pain and swelling to the tip of her right middle finger, which she admits she has been biting.  She states that she was able to get pus to drain from the finger but it continues to be red and swollen.     Physical Exam   Triage Vital Signs: ED Triage Vitals  Encounter Vitals Group     BP 12/15/22 0813 (!) 136/92     Systolic BP Percentile --      Diastolic BP Percentile --      Pulse Rate 12/15/22 0813 (!) 121     Resp 12/15/22 0813 18     Temp 12/15/22 0813 98.7 F (37.1 C)     Temp Source 12/15/22 0813 Oral     SpO2 12/15/22 0813 96 %     Weight 12/15/22 0814 240 lb (108.9 kg)     Height 12/15/22 0814 5\' 6"  (1.676 m)     Head Circumference --      Peak Flow --      Pain Score 12/15/22 0814 8     Pain Loc --      Pain Education --      Exclude from Growth Chart --     Most recent vital signs: Vitals:   12/15/22 0813  BP: (!) 136/92  Pulse: (!) 121  Resp: 18  Temp: 98.7 F (37.1 C)  SpO2: 96%    Constitutional: Alert and oriented. Eyes: Conjunctivae are normal. Head: Atraumatic. Nose: No congestion/rhinnorhea. Mouth/Throat: Mucous membranes are moist.   Cardiovascular: Normal rate, regular rhythm. Grossly normal heart sounds.  2+ radial pulses bilaterally. Respiratory: Normal respiratory effort.  No retractions. Lungs CTAB. Gastrointestinal: Soft and tender to palpation in the epigastrium and left upper quadrant where she has erythema and warmth with central induration and fluctuance. No distention. Musculoskeletal: No lower extremity tenderness nor edema.  Paronychia to right middle finger with some purulent drainage. Neurologic:  Normal speech and language. No gross focal neurologic deficits are appreciated.    ED Results / Procedures / Treatments   Labs (all labs ordered are listed, but only abnormal results are displayed) Labs Reviewed  COMPREHENSIVE METABOLIC PANEL - Abnormal; Notable for the following components:      Result Value   Sodium 132 (*)    Glucose, Bld 251 (*)    Total Bilirubin 1.3 (*)    All other components within normal limits  CBC WITH DIFFERENTIAL/PLATELET    RADIOLOGY CT abdomen/pelvis reviewed and interpreted by me with inflammatory changes of the abdominal wall, no focal fluid collections or dilated bowel loops noted.  PROCEDURES:  Critical Care performed: No  Procedures   MEDICATIONS  ORDERED IN ED: Medications  morphine (PF) 4 MG/ML injection 4 mg (4 mg Intravenous Given 12/15/22 0846)  lactated ringers bolus 1,000 mL (1,000 mLs Intravenous New Bag/Given 12/15/22 0847)  iohexol (OMNIPAQUE) 300 MG/ML solution 100 mL (100 mLs Intravenous Contrast Given 12/15/22 0917)     IMPRESSION / MDM / ASSESSMENT AND PLAN / ED COURSE  I reviewed the triage vital signs and the nursing notes.                              57 y.o. female with past medical history of hypertension, diabetes, atrial fibrillation, stroke, migraines, anemia, and chronic pain syndrome who presents to the ED with increasing redness and swelling to her abdomen with associated pain, also reports redness and swelling to tip of her right  middle finger.  Patient's presentation is most consistent with acute presentation with potential threat to life or bodily function.  Differential diagnosis includes, but is not limited to, abscess, cellulitis, intra-abdominal infection, paronychia, felon.  Patient nontoxic-appearing and in no acute distress, vital signs remarkable for tachycardia but otherwise reassuring.  Patient has evidence of cellulitis to her abdominal wall with possible developing abscess however it is difficult to determine extent of abscess clinically.  We will further assess with CT imaging and labs, treat symptomatically with IV morphine and hydrate with IV fluids.  While she is tachycardic, she appears well and I have low suspicion for sepsis at this time.  She does have evidence of a paronychia to her right middle finger, although it already appears to be draining.  We will have her soak her finger in warm water while awaiting results of labs and imaging.  Labs are reassuring with no significant anemia, leukocytosis, tract abnormality, or AKI.  LFTs are also unremarkable.  CT imaging shows inflammatory changes to the abdominal wall but no deep space infection or fluid collection.  Patient appropriate for outpatient management of abdominal wall cellulitis as well as her paronychia.  We will start on clindamycin for coverage of both given she is allergic to Keflex.  She was counseled to follow-up with her PCP and to return to the ED for new or worsening symptoms, patient agrees with plan.      FINAL CLINICAL IMPRESSION(S) / ED DIAGNOSES   Final diagnoses:  Cellulitis of abdominal wall  Paronychia of finger of right hand     Rx / DC Orders   ED Discharge Orders          Ordered    clindamycin (CLEOCIN) 150 MG capsule  3 times daily        12/15/22 1029             Note:  This document was prepared using Dragon voice recognition software and may include unintentional dictation errors.   Chesley Noon,  MD 12/15/22 1030

## 2022-12-15 NOTE — ED Notes (Signed)
Patient transported to CT 

## 2022-12-17 ENCOUNTER — Telehealth: Payer: Self-pay

## 2022-12-17 DIAGNOSIS — F411 Generalized anxiety disorder: Secondary | ICD-10-CM

## 2022-12-17 DIAGNOSIS — F3341 Major depressive disorder, recurrent, in partial remission: Secondary | ICD-10-CM

## 2022-12-17 MED ORDER — BREXPIPRAZOLE 0.25 MG PO TABS
0.2500 mg | ORAL_TABLET | Freq: Every day | ORAL | 0 refills | Status: DC
Start: 2022-12-17 — End: 2023-01-26

## 2022-12-17 NOTE — Telephone Encounter (Signed)
called and canceled order for the rexulti

## 2022-12-17 NOTE — Telephone Encounter (Signed)
I have [facility] Rexulti [facility]

## 2022-12-17 NOTE — Telephone Encounter (Signed)
pt called states tha she needs rx for the rexulti to go to [facility].

## 2022-12-18 NOTE — Telephone Encounter (Signed)
pt notified that rx were sent

## 2022-12-30 ENCOUNTER — Other Ambulatory Visit: Payer: Self-pay | Admitting: Psychiatry

## 2022-12-30 DIAGNOSIS — G4701 Insomnia due to medical condition: Secondary | ICD-10-CM

## 2023-01-07 ENCOUNTER — Ambulatory Visit (INDEPENDENT_AMBULATORY_CARE_PROVIDER_SITE_OTHER): Payer: 59 | Admitting: Psychiatry

## 2023-01-07 ENCOUNTER — Encounter: Payer: Self-pay | Admitting: Psychiatry

## 2023-01-07 VITALS — BP 136/83 | HR 88 | Temp 97.6°F | Ht 66.0 in | Wt 245.6 lb

## 2023-01-07 DIAGNOSIS — F331 Major depressive disorder, recurrent, moderate: Secondary | ICD-10-CM

## 2023-01-07 DIAGNOSIS — G4701 Insomnia due to medical condition: Secondary | ICD-10-CM | POA: Diagnosis not present

## 2023-01-07 DIAGNOSIS — F411 Generalized anxiety disorder: Secondary | ICD-10-CM

## 2023-01-07 DIAGNOSIS — F4310 Post-traumatic stress disorder, unspecified: Secondary | ICD-10-CM

## 2023-01-07 DIAGNOSIS — R7989 Other specified abnormal findings of blood chemistry: Secondary | ICD-10-CM

## 2023-01-07 MED ORDER — SERTRALINE HCL 25 MG PO TABS: ORAL_TABLET | ORAL | 0 refills | Status: DC

## 2023-01-07 MED ORDER — DULOXETINE HCL 30 MG PO CPEP: 30.0000 mg | ORAL_CAPSULE | Freq: Every day | ORAL | 0 refills | Status: DC

## 2023-01-07 NOTE — Progress Notes (Signed)
BH MD OP Progress Note  01/07/2023 12:15 PM Christine Mcneil  MRN:  [id number]  Chief Complaint:  Chief Complaint  Patient presents with   Follow-up   Depression   Anxiety   HPI: Christine Mcneil is a 57 year old Caucasian female, divorced, lives in [place], has a history of MDD, PTSD, insomnia, OSA, history of CVA, history of prothrombin gene mutation, chronic pain, migraine headaches, vitamin B12 deficiency, iron deficiency was evaluated in office today.  Patient today reports she is currently struggling with irritability, sadness, mood swings.  Patient reports this started a month ago.  It is gradually getting worse.  She also feels anxious about her health.Patient reports she was recently diagnosed with abnormal liver enzymes and is currently being referred to a specialist.  She is worried about that.  She reports she has to take care of her mother who is elderly as well as take care of her son who is disabled.  Patient reports she has been frustrated with everything that is going on since she has no help.  Patient reports sleep is overall good.  She denies any suicidality, homicidality or perceptual disturbances.  Patient reports she is compliant on her medications as prescribed including the duloxetine, Rexulti, Belsomra, gabapentin.  Other than the elevated LFTs, she denies any other side effects or concerns.    Visit Diagnosis:    ICD-10-CM   1. MDD (major depressive disorder), recurrent episode, moderate (HCC)  F33.1 DULoxetine (CYMBALTA) 30 MG capsule    sertraline (ZOLOFT) 25 MG tablet    2. PTSD (post-traumatic stress disorder)  F43.10 DULoxetine (CYMBALTA) 30 MG capsule    sertraline (ZOLOFT) 25 MG tablet    3. GAD (generalized anxiety disorder)  F41.1 DULoxetine (CYMBALTA) 30 MG capsule    4. Insomnia due to medical condition  G47.01    pain, anxiety    5. Elevated LFTs  R79.89 DULoxetine (CYMBALTA) 30 MG capsule      Past Psychiatric History: I have reviewed past  psychiatric history from progress note on 08/16/2017.  Past trials of Effexor, Klonopin.  Past Medical History:  Past Medical History:  Diagnosis Date   Abdominal pain    Anxiety and depression    Atypical facial pain    Bilateral occipital neuralgia    Cervical spondylosis without myelopathy    Chronic daily headache    Chronic pain    Chronic pelvic pain in female    Chronic thoracic back pain    Depression    Diabetes mellitus without complication (HCC)    Diabetes mellitus, type II (HCC)    Dysphagia    Dysrhythmia    Fibromyalgia    Hyperlipidemia    Hypertension    Hypothyroidism    Insomnia    Iron deficiency anemia    Left hip pain    Low back pain    Migraines    Numbness    Optic neuropathy    OSA (obstructive sleep apnea)    Polyarthralgia    Primary osteoarthritis of both hips    Prothrombin gene mutation (HCC)    Pseudotumor cerebri    Rectal bleeding    Right shoulder pain    Rotator cuff syndrome    Stroke [facility])    Thyroid disease    TIA (transient ischemic attack) 05/27/2017    Past Surgical History:  Procedure Laterality Date   ABDOMINAL HYSTERECTOMY     total   CHOLECYSTECTOMY     COLONOSCOPY WITH PROPOFOL N/A 03/21/2018  Procedure: COLONOSCOPY WITH PROPOFOL;  Surgeon: Midge Minium, MD;  Location: [facility] ENDOSCOPY;  Service: Endoscopy;  Laterality: N/A;   ERCP N/A 08/19/2020   Procedure: ENDOSCOPIC RETROGRADE CHOLANGIOPANCREATOGRAPHY (ERCP);  Surgeon: Midge Minium, MD;  Location: [facility] ENDOSCOPY;  Service: Endoscopy;  Laterality: N/A;   ESOPHAGOGASTRODUODENOSCOPY (EGD) WITH PROPOFOL N/A 03/21/2018   Procedure: ESOPHAGOGASTRODUODENOSCOPY (EGD) WITH PROPOFOL;  Surgeon: Midge Minium, MD;  Location: [facility] ENDOSCOPY;  Service: Endoscopy;  Laterality: N/A;   GIVENS CAPSULE STUDY N/A 09/24/2019   Procedure: GIVENS CAPSULE STUDY;  Surgeon: Pasty Spillers, MD;  Location: [facility] ENDOSCOPY;  Service: Endoscopy;  Laterality: N/A;   KNEE SURGERY Left     LEFT HEART CATH AND CORONARY ANGIOGRAPHY N/A 03/10/2022   Procedure: LEFT HEART CATH AND CORONARY ANGIOGRAPHY;  Surgeon: Alwyn Pea, MD;  Location: [facility] INVASIVE CV LAB;  Service: Cardiovascular;  Laterality: N/A;   ROTATOR CUFF REPAIR Right    spinal neurostimulator      Family Psychiatric History: I have reviewed family psychiatric history from progress note on 08/16/2017.  Family History:  Family History  Problem Relation Age of Onset   Diabetes Mother    Hyperlipidemia Mother    Hypertension Mother    COPD Mother    CVA Mother    Anemia Mother    Diabetes Father    Hyperlipidemia Father    Hypertension Father    Thyroid disease Father    Alcohol abuse Father    Peripheral vascular disease Sister    Hypertension Sister    Anxiety disorder Son    Depression Son    Bipolar disorder Son    Seizures Son     Social History: I have reviewed social history from progress note on 08/16/2017. Social History   Socioeconomic History   Marital status: Divorced    Spouse name: Not on file   Number of children: 1   Years of education: Not on file   Highest education level: High school graduate  Occupational History    Comment: disablitiy  Tobacco Use   Smoking status: Never   Smokeless tobacco: Never  Vaping Use   Vaping status: Never Used  Substance and Sexual Activity   Alcohol use: No   Drug use: No   Sexual activity: Not Currently  Other Topics Concern   Not on file  Social History Narrative   Not on file   Social Determinants of Health   Financial Resource Strain: Low Risk  (07/19/2017)   Overall Financial Resource Strain (CARDIA)    Difficulty of Paying Living Expenses: Not hard at all  Food Insecurity: No Food Insecurity (07/19/2017)   Hunger Vital Sign    Worried About Running Out of Food in the Last Year: Never true    Ran Out of Food in the Last Year: Never true  Transportation Needs: No Transportation Needs (07/19/2017)   PRAPARE - Therapist, art (Medical): No    Lack of Transportation (Non-Medical): No  Physical Activity: Inactive (07/19/2017)   Exercise Vital Sign    Days of Exercise per Week: 0 days    Minutes of Exercise per Session: 0 min  Stress: Stress Concern Present (07/19/2017)   [employer] of Occupational Health - Occupational Stress Questionnaire    Feeling of Stress : Very much  Social Connections: Moderately Isolated (07/19/2017)   Social Connection and Isolation Panel [NHANES]    Frequency of Communication with Friends and Family: Once a week    Frequency of Social Gatherings  with Friends and Family: Never    Attends Religious Services: More than 4 times per year    Active Member of Clubs or Organizations: No    Attends Banker Meetings: Never    Marital Status: Divorced    Allergies:  Allergies  Allergen Reactions   Amoxapine Itching   Amoxicillin Itching   Dapagliflozin Itching    Other reaction(s): Other (See Comments) Thrush & vaginal itching   Keflex [Cephalexin] Itching   Mirtazapine Other (See Comments)    Pt states felling "like my body is outside of me."    Metabolic Disorder Labs: Lab Results  Component Value Date   HGBA1C 9.9 (H) 06/21/2017   MPG 237.43 06/21/2017   Lab Results  Component Value Date   PROLACTIN 4.3 (L) 05/26/2021   PROLACTIN 5.5 09/06/2017   Lab Results  Component Value Date   CHOL 157 05/26/2021   TRIG 245 (H) 05/26/2021   HDL 32 (L) 05/26/2021   CHOLHDL 4.9 (H) 05/26/2021   VLDL 39 06/21/2017   LDLCALC 84 05/26/2021   LDLCALC 174 (H) 06/21/2017   Lab Results  Component Value Date   TSH 2.890 09/06/2017   TSH 3.176 01/04/2017    Therapeutic Level Labs: No results found for: "LITHIUM" No results found for: "VALPROATE" No results found for: "CBMZ"  Current Medications: Current Outpatient Medications  Medication Sig Dispense Refill   ACCU-CHEK AVIVA PLUS test strip      ACCU-CHEK SOFTCLIX LANCETS lancets       Acetaminophen (TYLENOL ARTHRITIS EXT RELIEF PO) Take 600 mg by mouth daily.     albuterol (VENTOLIN HFA) 108 (90 Base) MCG/ACT inhaler Inhale 2 puffs into the lungs every 6 (six) hours as needed for wheezing or shortness of breath. 1 g 0   atorvastatin (LIPITOR) 80 MG tablet Take 80 mg by mouth daily at 6 PM.     BELSOMRA 20 MG TABS TAKE 1 TABLET BY MOUTH EVERYDAY AT BEDTIME 30 tablet 2   Blood Glucose Monitoring Suppl (ACCU-CHEK AVIVA PLUS) w/Device KIT      brexpiprazole (REXULTI) 0.25 MG TABS tablet Take 1 tablet (0.25 mg total) by mouth daily. 90 tablet 0   Carboxymethylcellulose Sod PF 0.5 % SOLN Apply 1 drop to eye daily as needed.     Cholecalciferol (VITAMIN D3) 1000 units CAPS Take 1 capsule by mouth daily.     clobetasol ointment (TEMOVATE) 0.05 % Apply topically.     Continuous Blood Gluc Receiver (DEXCOM G6 RECEIVER) DEVI Use to monitor blood sugar. NDC [phone]     Continuous Blood Gluc Sensor (DEXCOM G6 SENSOR) MISC Use to monitor blood sugar.  Replace every 10 days. NDC [phone].     Continuous Blood Gluc Transmit (DEXCOM G6 TRANSMITTER) MISC Use to monitor blood sugar.  Replace every 3 months. NDC [phone].     cyclobenzaprine (FLEXERIL) 5 MG tablet Take 1 tablet by mouth 3 (three) times daily as needed.     DILT-XR 240 MG 24 hr capsule Take 240 mg by mouth daily.     DULoxetine (CYMBALTA) 30 MG capsule Take 1 capsule (30 mg total) by mouth daily for 15 days. Stop in 15 days. 15 capsule 0   esomeprazole (NEXIUM) 40 MG capsule Take 40 mg by mouth daily.     ezetimibe (ZETIA) 10 MG tablet Take 10 mg by mouth at bedtime.     famotidine (PEPCID) 40 MG tablet Take 40 mg by mouth at bedtime.     ferrous sulfate 325 (  65 FE) MG tablet Take by mouth. Take 325 mg daily with breakfast     fluticasone (FLONASE) 50 MCG/ACT nasal spray Place 2 sprays into both nostrils as needed.      furosemide (LASIX) 40 MG tablet TAKE (1) TABLET BY MOUTH EVERY DAY     gabapentin (NEURONTIN)  600 MG tablet TAKE 1 TABLET (600 MG TOTAL) BY MOUTH DAILY. (DOSE CHANGE) 90 tablet 0   HUMALOG KWIKPEN 100 UNIT/ML KwikPen SMARTSIG:0-50 Unit(s) SUB-Q Daily     hydrOXYzine (VISTARIL) 25 MG capsule Take 1-2 capsules (25-50 mg total) by mouth 2 (two) times daily as needed for anxiety (and sleep). 360 capsule 0   insulin degludec (TRESIBA FLEXTOUCH) 200 UNIT/ML FlexTouch Pen Inject into the skin.     Insulin Disposable Pump (OMNIPOD 5 G6 INTRO, GEN 5,) KIT SMARTSIG:1 SUB-Q Every 3 Days     Insulin Lispro w/ Trans Port 100 UNIT/ML SOPN INJECT UP TO 50 UNITS DAILY DIVIDED DOSES, AS DIRECTED     Insulin Pen Needle (TRUEPLUS PEN NEEDLES) 29G X MISC      levothyroxine (SYNTHROID) 112 MCG tablet Take 112 mcg by mouth daily.     levothyroxine (SYNTHROID, LEVOTHROID) 100 MCG tablet Take 100 mcg by mouth daily.     lidocaine 4 % 1 patch daily.     magnesium oxide (MAG-OX) 400 MG tablet Take 1 tablet by mouth daily.     Melatonin 10 MG TABS Take 2 tablets by mouth at bedtime.     meloxicam (MOBIC) 15 MG tablet Take 15 mg by mouth daily.     metFORMIN (GLUCOPHAGE-XR) 500 MG 24 hr tablet Take 500 mg by mouth 2 (two) times daily.      metoprolol succinate (TOPROL-XL) 25 MG 24 hr tablet Take 1 tablet by mouth daily.     mometasone (ELOCON) 0.1 % cream Apply topically.     naloxone (NARCAN) nasal spray 4 mg/0.1 mL Place into the nose.     nystatin (MYCOSTATIN) 100000 UNIT/ML suspension Take 5 mLs by mouth 4 (four) times daily.     omeprazole (PRILOSEC) 20 MG capsule TAKE (1) CAPSULE BY MOUTH EVERY DAY 30 capsule 0   ondansetron (ZOFRAN) 4 MG tablet Take 4 mg by mouth every 8 (eight) hours as needed.     Riboflavin 400 MG TABS Take 1 tablet by mouth every night  for headaches     RYBELSUS 7 MG TABS Take 1 tablet by mouth daily.     sertraline (ZOLOFT) 25 MG tablet Take 1 tablet (25 mg total) by mouth daily for 15 days, THEN 2 tablets (50 mg total) daily for 15 days. 45 tablet 0   TOUJEO SOLOSTAR 300  UNIT/ML Solostar Pen SMARTSIG:76 Unit(s) SUB-Q Every Night     vitamin B-12 (CYANOCOBALAMIN) 100 MCG tablet Take 1 tablet (100 mcg total) by mouth daily. 90 tablet 1   XTAMPZA ER 9 MG C12A PLEASE SEE ATTACHED FOR DETAILED DIRECTIONS     zonisamide (ZONEGRAN) 100 MG capsule Take 100 mg by mouth daily.     dabigatran (PRADAXA) 150 MG CAPS capsule Take 150 mg by mouth 2 (two) times daily.     nitroGLYCERIN (NITROSTAT) 0.4 MG SL tablet Place under the tongue.     pioglitazone (ACTOS) 15 MG tablet Take by mouth. Take 1 tablet by mouth once daily     No current facility-administered medications for this visit.   Facility-Administered Medications Ordered in Other Visits  Medication Dose Route Frequency Provider Last Rate Last  Admin   iohexol (OMNIPAQUE) 300 MG/ML solution    PRN Callwood, Dwayne D, MD   60 mL at 03/10/22 1442     Musculoskeletal: Strength & Muscle Tone: within normal limits Gait & Station: normal Patient leans: N/A  Psychiatric Specialty Exam: Review of Systems  Psychiatric/Behavioral:  Positive for dysphoric mood. The patient is nervous/anxious.     Blood pressure 136/83, pulse 88, temperature 97.6 F (36.4 C), temperature source Skin, height 5\' 6"  (1.676 m), weight 245 lb 9.6 oz (111.4 kg).Body mass index is 39.64 kg/m.  General Appearance: Fairly Groomed  Eye Contact:  Fair  Speech:  Clear and Coherent  Volume:  Normal  Mood:   irritable, sad, anxious  Affect:  Congruent  Thought Process:  Goal Directed and Descriptions of Associations: Intact  Orientation:  Full (Time, Place, and Person)  Thought Content: Logical   Suicidal Thoughts:  No  Homicidal Thoughts:  No  Memory:  Immediate;   Fair Recent;   Fair Remote;   Fair  Judgement:  Fair  Insight:  Fair  Psychomotor Activity:  Normal  Concentration:  Concentration: Fair and Attention Span: Fair  Recall:  [employer] of Knowledge: Fair  Language: Fair  Akathisia:  No  Handed:  Right  AIMS (if indicated):  done  Assets:  Communication Skills Desire for Improvement Housing Social Support  ADL's:  Intact  Cognition: WNL  Sleep:  Fair   Screenings: Geneticist, molecular Office Visit from 01/07/2023 in [facility] Office Visit from 09/15/2022 in [facility] Video Visit from 02/04/2022 in [facility] Office Visit from 12/30/2021 in [facility] Office Visit from 11/18/2021 in [facility]  AIMS Total Score 0 0 0 0 0      GAD-7    Flowsheet Row Office Visit from 01/07/2023 in [facility] Office Visit from 09/15/2022 in [facility] Counselor from 06/23/2022 in [facility] Counselor from 05/18/2022 in [facility] Counselor from 05/04/2022 in [facility]  Total GAD-7 Score 13 12 10 18 16       PHQ2-9    Flowsheet Row Office Visit from 01/07/2023 in [facility] Office Visit from 09/15/2022 in [facility] Counselor from 06/23/2022 in [facility] Counselor from 06/10/2022 in [facility] Counselor from 05/18/2022 in [facility]  PHQ-2 Total Score 4 3 2 5 5   PHQ-9 Total Score 11 5 4 16 12       Flowsheet Row Office Visit from 01/07/2023 in [facility] ED from 12/15/2022 in [facility] Emergency Department at [facility] Visit from 09/15/2022 in [facility]  C-SSRS RISK CATEGORY No Risk No Risk Low Risk         Assessment and Plan: ALAYNA SVEUM is a 57 year old Caucasian female who has a history of MDD, PTSD, multiple medical problems was evaluated in office today.  Patient with recent worsening of mood, multiple psychosocial stressors as well as abnormal liver enzymes, patient being on medications like duloxetine which may not be appropriate if she does have hepatic problems, discussed the following medication changes with patient today.  Plan as noted below.  Plan MDD-unstable Change Cymbalta to Zoloft. Taper of Cymbalta to 30 mg p.o. daily for 15 days and stop taking it. Start sertraline 25 mg p.o. daily for 15 days and increase to 50 mg p.o. daily after 15 days. Continue gabapentin 600 mg p.o. daily   GAD-unstable Start sertraline as noted above Hydroxyzine 25-50 mg p.o. twice daily as needed Patient was provided resources to establish care with a therapist previously-encouraged to do so  PTSD-unstable Patient will benefit from psychotherapy session, patient to establish care Start sertraline 25 mg p.o. daily and titrate dosage up as noted above  Insomnia-stable Belsomra 20 mg p.o. nightly Reviewed [place] PMP AWARxE  Abnormal liver enzymes-unstable Patient currently referred to a specialist per primary care. I have reviewed and discussed labs-CMP-dated 12/23/2022-ALT elevated at 42, alkaline phosphatase 113. Will discontinue Cymbalta given hepatic problems. Patient encouraged to follow up with her providers.   Follow-up in clinic in 2 weeks or sooner if needed.  Collaboration of Care: Collaboration of Care: Referral or follow-up with counselor/therapist AEB patient encouraged to establish care with therapist as well as to follow up with specialist for her hepatic problems.  Patient/Guardian was advised Release of Information must be obtained prior to any record release in order to collaborate their care with an outside provider. Patient/Guardian was advised if they have not already  done so to contact the registration department to sign all necessary forms in order for [name] to release information regarding their care.   Consent: Patient/Guardian gives verbal consent for treatment and assignment of benefits for services provided during this visit. Patient/Guardian expressed understanding and agreed to proceed.   This note was generated in part or whole with voice recognition software. Voice recognition is usually quite accurate but there are transcription errors that can and very often do occur. I apologize for any typographical errors that were not detected and corrected.    Jomarie Longs, MD 01/07/2023, 12:15 PM

## 2023-01-07 NOTE — Patient Instructions (Signed)
Please stop taking Cymbalta 60 mg twice daily.  Please start taking Cymbalta 20 mg p.o. daily for 15 days and stop Cymbalta.  Start sertraline when you start the Cymbalta 20 mg p.o. daily, sertraline 25 mg daily with breakfast for 15 days and then increase to sertraline 50 mg p.o. daily with breakfast after that.    Sertraline Tablets What is this medication? SERTRALINE (SER tra leen) treats depression, anxiety, obsessive-compulsive disorder (OCD), post-traumatic stress disorder (PTSD), and premenstrual dysphoric disorder (PMDD). It increases the amount of serotonin in the brain, a hormone that helps regulate mood. It belongs to a group of medications called SSRIs. This medicine may be used for other purposes; ask your health care provider or pharmacist if you have questions. COMMON BRAND NAME(S): Zoloft What should I tell my care team before I take this medication? They need to know if you have any of these conditions: Bleeding disorders Bipolar disorder or a family history of bipolar disorder Frequently drink alcohol Glaucoma Heart disease High blood pressure History of irregular heartbeat History of low levels of calcium, magnesium, or potassium in the blood Liver disease Receiving electroconvulsive therapy Seizures Suicidal thoughts, plans, or attempt by you or a family member Take medications that prevent or treat blood clots Thyroid disease An unusual or allergic reaction to sertraline, other medications, foods, dyes, or preservatives Pregnant or trying to get pregnant Breastfeeding How should I use this medication? Take this medication by mouth with a glass of water. Take it as directed on the prescription label at the same time every day. You can take it with or without food. If it upsets your stomach, take it with food. Do not take your medication more often than directed. Keep taking this medication unless your care team tells you to stop. Stopping it too quickly can cause  serious side effects. It can also make your condition worse. A special MedGuide will be given to you by the pharmacist with each prescription and refill. Be sure to read this information carefully each time. Talk to your care team about the use of this medication in children. While it may be prescribed for children as young as 7 years for selected conditions, precautions do apply. Overdosage: If you think you have taken too much of this medicine contact a poison control center or emergency room at once. NOTE: This medicine is only for you. Do not share this medicine with others. What if I miss a dose? If you miss a dose, take it as soon as you can. If it is almost time for your next dose, take only that dose. Do not take double or extra doses. What may interact with this medication? Do not take this medication with any of the following: Cisapride Dronedarone Linezolid MAOIs, such as Carbex, Eldepryl, Marplan, Nardil, and Parnate Methylene blue (injected into a vein) Pimozide Thioridazine This medication may also interact with the following: Alcohol Amphetamines Aspirin and aspirin-like medications Certain medications for fungal infections, such as ketoconazole, fluconazole, posaconazole, itraconazole Certain medications for irregular heart beat, such as flecainide, quinidine, propafenone Certain medications for mental health conditions Certain medications for migraine headaches, such as almotriptan, eletriptan, frovatriptan, naratriptan, rizatriptan, sumatriptan, zolmitriptan Certain medications for seizures, such as carbamazepine, valproic acid, phenytoin Certain medications for sleep Certain medications that prevent or treat blood clots, such as warfarin, enoxaparin, dalteparin Cimetidine Digoxin Diuretics Fentanyl Isoniazid Lithium NSAIDs, medications for pain and inflammation, such as ibuprofen or naproxen Other medications that cause heart rhythm changes, such as  dofetilide  Rasagiline Safinamide Supplements, such as St. John's wort, kava kava, valerian Tolbutamide Tramadol Tryptophan This list may not describe all possible interactions. Give your health care provider a list of all the medicines, herbs, non-prescription drugs, or dietary supplements you use. Also tell them if you smoke, drink alcohol, or use illegal drugs. Some items may interact with your medicine. What should I watch for while using this medication? Tell your care team if your symptoms do not get better or if they get worse. Visit your care team for regular checks on your progress. Because it may take several weeks to see the full effects of this medication, it is important to continue your treatment as prescribed by your care team. Patients and their families should watch out for new or worsening thoughts of suicide or depression. Also watch out for sudden changes in feelings such as feeling anxious, agitated, panicky, irritable, hostile, aggressive, impulsive, severely restless, overly excited and hyperactive, or not being able to sleep. If this happens, especially at the beginning of treatment or after a change in dose, call your care team. This medication may affect your coordination, reaction time, or judgment. Do not drive or operate machinery until you know how this medication affects you. Sit or stand up slowly to reduce the risk of dizzy or fainting spells. Drinking alcohol with this medication can increase the risk of these side effects. Your mouth may get dry. Chewing sugarless gum or sucking hard candy, and drinking plenty of water may help. Contact your care team if the problem does not go away or is severe. What side effects may I notice from receiving this medication? Side effects that you should report to your care team as soon as possible: Allergic reactions--skin rash, itching, hives, swelling of the face, lips, tongue, or throat Bleeding--bloody or black, tar-like stools,  red or dark brown urine, vomiting blood or brown material that looks like coffee grounds, small red or purple spots on skin, unusual bleeding or bruising Heart rhythm changes--fast or irregular heartbeat, dizziness, feeling faint or lightheaded, chest pain, trouble breathing Low sodium level--muscle weakness, fatigue, dizziness, headache, confusion Serotonin syndrome--irritability, confusion, fast or irregular heartbeat, muscle stiffness, twitching muscles, sweating, high fever, seizure, chills, vomiting, diarrhea Sudden eye pain or change in vision such as blurred vision, seeing halos around lights, vision loss Thoughts of suicide or self-harm, worsening mood Side effects that usually do not require medical attention (report these to your care team if they continue or are bothersome): Change in sex drive or performance Diarrhea Excessive sweating Nausea Tremors or shaking Upset stomach This list may not describe all possible side effects. Call your doctor for medical advice about side effects. You may report side effects to FDA at 1-800-FDA-1088. Where should I keep my medication? Keep out of the reach of children and pets. Store at room temperature between 20 and 25 degrees C (68 and 77 degrees F). Get rid of any unused medication after the expiration date. To get rid of medications that are no longer needed or expired: Take the medication to a medication take-back program. Check with your pharmacy or law enforcement to find a location. If you cannot return the medication, check the label or package insert to see if the medication should be thrown out in the garbage or flushed down the toilet. If you are not sure, ask your care team. If it is safe to put in the trash, empty the medication out of the container. Mix the medication with cat litter, dirt,  coffee grounds, or other unwanted substance. Seal the mixture in a bag or container. Put it in the trash. NOTE: This sheet is a summary. It may  not cover all possible information. If you have questions about this medicine, talk to your doctor, pharmacist, or health care provider.  2024 [employer] (2021-12-08 00:00:00)

## 2023-01-15 ENCOUNTER — Other Ambulatory Visit: Payer: Self-pay | Admitting: Psychiatry

## 2023-01-15 DIAGNOSIS — F4310 Post-traumatic stress disorder, unspecified: Secondary | ICD-10-CM

## 2023-01-15 DIAGNOSIS — F331 Major depressive disorder, recurrent, moderate: Secondary | ICD-10-CM

## 2023-01-15 DIAGNOSIS — R7989 Other specified abnormal findings of blood chemistry: Secondary | ICD-10-CM

## 2023-01-15 DIAGNOSIS — F411 Generalized anxiety disorder: Secondary | ICD-10-CM

## 2023-01-26 ENCOUNTER — Ambulatory Visit (INDEPENDENT_AMBULATORY_CARE_PROVIDER_SITE_OTHER): Payer: 59 | Admitting: Psychiatry

## 2023-01-26 ENCOUNTER — Encounter: Payer: Self-pay | Admitting: Psychiatry

## 2023-01-26 VITALS — BP 135/86 | HR 79 | Temp 97.9°F | Ht 66.0 in | Wt 248.4 lb

## 2023-01-26 DIAGNOSIS — G4701 Insomnia due to medical condition: Secondary | ICD-10-CM

## 2023-01-26 DIAGNOSIS — F331 Major depressive disorder, recurrent, moderate: Secondary | ICD-10-CM

## 2023-01-26 DIAGNOSIS — F4310 Post-traumatic stress disorder, unspecified: Secondary | ICD-10-CM | POA: Diagnosis not present

## 2023-01-26 DIAGNOSIS — F411 Generalized anxiety disorder: Secondary | ICD-10-CM | POA: Diagnosis not present

## 2023-01-26 MED ORDER — REXULTI 0.5 MG PO TABS
0.5000 mg | ORAL_TABLET | Freq: Every day | ORAL | 1 refills | Status: DC
Start: 2023-01-26 — End: 2023-03-23

## 2023-01-26 MED ORDER — SERTRALINE HCL 50 MG PO TABS
50.0000 mg | ORAL_TABLET | Freq: Every day | ORAL | 1 refills | Status: DC
Start: 2023-01-26 — End: 2023-02-17

## 2023-01-26 NOTE — Patient Instructions (Signed)
Please call Phyllis Ginger - [phone] for therapy .

## 2023-01-26 NOTE — Progress Notes (Unsigned)
BH MD OP Progress Note  01/26/2023 4:01 PM Christine Mcneil  MRN:  [id number]  Chief Complaint:  Chief Complaint  Patient presents with   Follow-up   Anxiety   Depression   Medication Refill   HPI: Christine Mcneil is a 57 year old Caucasian female, divorced, lives in [place], has a history of MDD, PTSD, insomnia, OSA, history of CVA, history of prothrombin gene mutation, chronic pain, migraine headaches, vitamin B12 deficiency, iron deficiency was evaluated in office today.  Patient today reports she is currently struggling with irritability, mood swings.  She reports she has not noticed much difference from the sertraline yet.  She was able to stop the Cymbalta.  Unknown if that likely contributing to worsening mood symptoms.  She reports she just started the sertraline 50 mg yesterday.  She is agreeable to giving it more time.  Denies side effects.  Patient reports sleep was restless and she had leg swelling recently and she had to take a diuretic at night.  She reports she had to stay up because she had to urinate and that affected her sleep.  Patient also reports she is in a lot of pain of her back and lower extremities which also affects her sleep and mood.  Patient reports her mother was admitted to the hospital for pneumonia and she had to stay with her.  Her mother is currently back home and she is the primary caregiver.  Her mother cannot do much now and does not listen to her and that also has been frustrating.  She is currently trying to get her some help to take care of her mother.  Patient reports she is compliant on all her medications.  She is motivated to start psychotherapy when it was discussed with patient.  She denies any suicidality, homicidality or perceptual disturbances.    Visit Diagnosis:    ICD-10-CM   1. MDD (major depressive disorder), recurrent episode, moderate (HCC)  F33.1 sertraline (ZOLOFT) 50 MG tablet    Brexpiprazole (REXULTI) 0.5 MG TABS    2. PTSD  (post-traumatic stress disorder)  F43.10 sertraline (ZOLOFT) 50 MG tablet    Brexpiprazole (REXULTI) 0.5 MG TABS    3. GAD (generalized anxiety disorder)  F41.1 sertraline (ZOLOFT) 50 MG tablet    Brexpiprazole (REXULTI) 0.5 MG TABS    4. Insomnia due to medical condition  G47.01    Pain, anxiety      Past Psychiatric History: I have reviewed past psychiatric history from progress note on 08/16/2017.  Past trials of Effexor, Klonopin.  Past Medical History:  Past Medical History:  Diagnosis Date   Abdominal pain    Anxiety and depression    Atypical facial pain    Bilateral occipital neuralgia    Cervical spondylosis without myelopathy    Chronic daily headache    Chronic pain    Chronic pelvic pain in female    Chronic thoracic back pain    Depression    Diabetes mellitus without complication (HCC)    Diabetes mellitus, type II (HCC)    Dysphagia    Dysrhythmia    Fibromyalgia    Hyperlipidemia    Hypertension    Hypothyroidism    Insomnia    Iron deficiency anemia    Left hip pain    Low back pain    Migraines    Numbness    Optic neuropathy    OSA (obstructive sleep apnea)    Polyarthralgia    Primary osteoarthritis of both hips  Prothrombin gene mutation (HCC)    Pseudotumor cerebri    Rectal bleeding    Right shoulder pain    Rotator cuff syndrome    Stroke [facility])    Thyroid disease    TIA (transient ischemic attack) 05/27/2017    Past Surgical History:  Procedure Laterality Date   ABDOMINAL HYSTERECTOMY     total   CHOLECYSTECTOMY     COLONOSCOPY WITH PROPOFOL N/A 03/21/2018   Procedure: COLONOSCOPY WITH PROPOFOL;  Surgeon: Midge Minium, MD;  Location: [facility] ENDOSCOPY;  Service: Endoscopy;  Laterality: N/A;   ERCP N/A 08/19/2020   Procedure: ENDOSCOPIC RETROGRADE CHOLANGIOPANCREATOGRAPHY (ERCP);  Surgeon: Midge Minium, MD;  Location: [facility] ENDOSCOPY;  Service: Endoscopy;  Laterality: N/A;   ESOPHAGOGASTRODUODENOSCOPY (EGD) WITH PROPOFOL N/A 03/21/2018    Procedure: ESOPHAGOGASTRODUODENOSCOPY (EGD) WITH PROPOFOL;  Surgeon: Midge Minium, MD;  Location: [facility] ENDOSCOPY;  Service: Endoscopy;  Laterality: N/A;   GIVENS CAPSULE STUDY N/A 09/24/2019   Procedure: GIVENS CAPSULE STUDY;  Surgeon: Pasty Spillers, MD;  Location: [facility] ENDOSCOPY;  Service: Endoscopy;  Laterality: N/A;   KNEE SURGERY Left    LEFT HEART CATH AND CORONARY ANGIOGRAPHY N/A 03/10/2022   Procedure: LEFT HEART CATH AND CORONARY ANGIOGRAPHY;  Surgeon: Alwyn Pea, MD;  Location: [facility] INVASIVE CV LAB;  Service: Cardiovascular;  Laterality: N/A;   ROTATOR CUFF REPAIR Right    spinal neurostimulator      Family Psychiatric History: I have reviewed family psychiatric history from progress note on 08/16/2017.  Family History:  Family History  Problem Relation Age of Onset   Diabetes Mother    Hyperlipidemia Mother    Hypertension Mother    COPD Mother    CVA Mother    Anemia Mother    Diabetes Father    Hyperlipidemia Father    Hypertension Father    Thyroid disease Father    Alcohol abuse Father    Peripheral vascular disease Sister    Hypertension Sister    Anxiety disorder Son    Depression Son    Bipolar disorder Son    Seizures Son     Social History: I have reviewed social history from progress note on 08/16/2017. Social History   Socioeconomic History   Marital status: Divorced    Spouse name: Not on file   Number of children: 1   Years of education: Not on file   Highest education level: High school graduate  Occupational History    Comment: disablitiy  Tobacco Use   Smoking status: Never   Smokeless tobacco: Never  Vaping Use   Vaping status: Never Used  Substance and Sexual Activity   Alcohol use: No   Drug use: No   Sexual activity: Not Currently  Other Topics Concern   Not on file  Social History Narrative   Not on file   Social Determinants of Health   Financial Resource Strain: Low Risk  (07/19/2017)   Overall Financial  Resource Strain (CARDIA)    Difficulty of Paying Living Expenses: Not hard at all  Food Insecurity: No Food Insecurity (07/19/2017)   Hunger Vital Sign    Worried About Running Out of Food in the Last Year: Never true    Ran Out of Food in the Last Year: Never true  Transportation Needs: No Transportation Needs (07/19/2017)   PRAPARE - Administrator, Civil Service (Medical): No    Lack of Transportation (Non-Medical): No  Physical Activity: Inactive (07/19/2017)   Exercise Vital Sign  Days of Exercise per Week: 0 days    Minutes of Exercise per Session: 0 min  Stress: Stress Concern Present (07/19/2017)   [employer] of Occupational Health - Occupational Stress Questionnaire    Feeling of Stress : Very much  Social Connections: Moderately Isolated (07/19/2017)   Social Connection and Isolation Panel [NHANES]    Frequency of Communication with Friends and Family: Once a week    Frequency of Social Gatherings with Friends and Family: Never    Attends Religious Services: More than 4 times per year    Active Member of [employer] or Organizations: No    Attends Banker Meetings: Never    Marital Status: Divorced    Allergies:  Allergies  Allergen Reactions   Amoxapine Itching   Amoxicillin Itching   Dapagliflozin Itching    Other reaction(s): Other (See Comments) Thrush & vaginal itching   Keflex [Cephalexin] Itching   Mirtazapine Other (See Comments)    Pt states felling "like my body is outside of me."    Metabolic Disorder Labs: Lab Results  Component Value Date   HGBA1C 9.9 (H) 06/21/2017   MPG 237.43 06/21/2017   Lab Results  Component Value Date   PROLACTIN 4.3 (L) 05/26/2021   PROLACTIN 5.5 09/06/2017   Lab Results  Component Value Date   CHOL 157 05/26/2021   TRIG 245 (H) 05/26/2021   HDL 32 (L) 05/26/2021   CHOLHDL 4.9 (H) 05/26/2021   VLDL 39 06/21/2017   LDLCALC 84 05/26/2021   LDLCALC 174 (H) 06/21/2017   Lab Results   Component Value Date   TSH 2.890 09/06/2017   TSH 3.176 01/04/2017    Therapeutic Level Labs: No results found for: "LITHIUM" No results found for: "VALPROATE" No results found for: "CBMZ"  Current Medications: Current Outpatient Medications  Medication Sig Dispense Refill   ACCU-CHEK AVIVA PLUS test strip      ACCU-CHEK SOFTCLIX LANCETS lancets      Acetaminophen (TYLENOL ARTHRITIS EXT RELIEF PO) Take 600 mg by mouth daily.     albuterol (VENTOLIN HFA) 108 (90 Base) MCG/ACT inhaler Inhale 2 puffs into the lungs every 6 (six) hours as needed for wheezing or shortness of breath. 1 g 0   atorvastatin (LIPITOR) 80 MG tablet Take 80 mg by mouth daily at 6 PM.     BELSOMRA 20 MG TABS TAKE 1 TABLET BY MOUTH EVERYDAY AT BEDTIME 30 tablet 2   Blood Glucose Monitoring Suppl (ACCU-CHEK AVIVA PLUS) w/Device KIT      Brexpiprazole (REXULTI) 0.5 MG TABS Take 1 tablet (0.5 mg total) by mouth daily. 30 tablet 1   Carboxymethylcellulose Sod PF 0.5 % SOLN Apply 1 drop to eye daily as needed.     Cholecalciferol (VITAMIN D3) 1000 units CAPS Take 1 capsule by mouth daily.     clobetasol ointment (TEMOVATE) 0.05 % Apply topically.     Continuous Blood Gluc Receiver (DEXCOM G6 RECEIVER) DEVI Use to monitor blood sugar. NDC [phone]     Continuous Blood Gluc Sensor (DEXCOM G6 SENSOR) MISC Use to monitor blood sugar.  Replace every 10 days. NDC [phone].     Continuous Blood Gluc Transmit (DEXCOM G6 TRANSMITTER) MISC Use to monitor blood sugar.  Replace every 3 months. NDC [phone].     cyclobenzaprine (FLEXERIL) 5 MG tablet Take 1 tablet by mouth 3 (three) times daily as needed.     DILT-XR 240 MG 24 hr capsule Take 240 mg by mouth daily.  esomeprazole (NEXIUM) 40 MG capsule Take 40 mg by mouth daily.     ezetimibe (ZETIA) 10 MG tablet Take 10 mg by mouth at bedtime.     famotidine (PEPCID) 40 MG tablet Take 40 mg by mouth at bedtime.     ferrous sulfate 325 (65 FE) MG tablet Take by  mouth. Take 325 mg daily with breakfast     fluticasone (FLONASE) 50 MCG/ACT nasal spray Place 2 sprays into both nostrils as needed.      furosemide (LASIX) 40 MG tablet TAKE (1) TABLET BY MOUTH EVERY DAY     gabapentin (NEURONTIN) 600 MG tablet TAKE 1 TABLET (600 MG TOTAL) BY MOUTH DAILY. (DOSE CHANGE) 90 tablet 0   HUMALOG KWIKPEN 100 UNIT/ML KwikPen SMARTSIG:0-50 Unit(s) SUB-Q Daily     hydrOXYzine (VISTARIL) 25 MG capsule Take 1-2 capsules (25-50 mg total) by mouth 2 (two) times daily as needed for anxiety (and sleep). 360 capsule 0   insulin degludec (TRESIBA FLEXTOUCH) 200 UNIT/ML FlexTouch Pen Inject into the skin.     Insulin Disposable Pump (OMNIPOD 5 G6 INTRO, GEN 5,) KIT SMARTSIG:1 SUB-Q Every 3 Days     Insulin Lispro w/ Trans Port 100 UNIT/ML SOPN INJECT UP TO 50 UNITS DAILY DIVIDED DOSES, AS DIRECTED     Insulin Pen Needle (TRUEPLUS PEN NEEDLES) 29G X MISC      levothyroxine (SYNTHROID) 112 MCG tablet Take 112 mcg by mouth daily.     lidocaine 4 % 1 patch daily.     magnesium oxide (MAG-OX) 400 MG tablet Take 1 tablet by mouth daily.     Melatonin 10 MG TABS Take 2 tablets by mouth at bedtime.     meloxicam (MOBIC) 15 MG tablet Take 15 mg by mouth daily.     metFORMIN (GLUCOPHAGE-XR) 500 MG 24 hr tablet Take 500 mg by mouth 2 (two) times daily.      metoprolol succinate (TOPROL-XL) 25 MG 24 hr tablet Take 1 tablet by mouth daily.     mometasone (ELOCON) 0.1 % cream Apply topically.     naloxone (NARCAN) nasal spray 4 mg/0.1 mL Place into the nose.     nystatin (MYCOSTATIN) 100000 UNIT/ML suspension Take 5 mLs by mouth 4 (four) times daily.     omeprazole (PRILOSEC) 20 MG capsule TAKE (1) CAPSULE BY MOUTH EVERY DAY 30 capsule 0   ondansetron (ZOFRAN) 4 MG tablet Take 4 mg by mouth every 8 (eight) hours as needed.     Riboflavin 400 MG TABS Take 1 tablet by mouth every night  for headaches     RYBELSUS 7 MG TABS Take 1 tablet by mouth daily.     sertraline (ZOLOFT) 50 MG  tablet Take 1 tablet (50 mg total) by mouth daily. 30 tablet 1   TOUJEO SOLOSTAR 300 UNIT/ML Solostar Pen SMARTSIG:76 Unit(s) SUB-Q Every Night     vitamin B-12 (CYANOCOBALAMIN) 100 MCG tablet Take 1 tablet (100 mcg total) by mouth daily. 90 tablet 1   XTAMPZA ER 9 MG C12A PLEASE SEE ATTACHED FOR DETAILED DIRECTIONS     zonisamide (ZONEGRAN) 100 MG capsule Take 100 mg by mouth daily.     dabigatran (PRADAXA) 150 MG CAPS capsule Take 150 mg by mouth 2 (two) times daily.     nitroGLYCERIN (NITROSTAT) 0.4 MG SL tablet Place under the tongue.     pioglitazone (ACTOS) 15 MG tablet Take by mouth. Take 1 tablet by mouth once daily     No current facility-administered  medications for this visit.   Facility-Administered Medications Ordered in Other Visits  Medication Dose Route Frequency Provider Last Rate Last Admin   iohexol (OMNIPAQUE) 300 MG/ML solution    PRN Callwood, Dwayne D, MD   60 mL at 03/10/22 1442     Musculoskeletal: Strength & Muscle Tone: within normal limits Gait & Station: normal Patient leans: N/A  Psychiatric Specialty Exam: Review of Systems  Musculoskeletal:        Lower extremities-pain bilateral, lower extremity edema-currently improving.  Psychiatric/Behavioral:  Positive for decreased concentration, dysphoric mood and sleep disturbance. The patient is nervous/anxious.     Blood pressure 135/86, pulse 79, temperature 97.9 F (36.6 C), temperature source Skin, height 5\' 6"  (1.676 m), weight 248 lb 6.4 oz (112.7 kg).Body mass index is 40.09 kg/m.  General Appearance: Fairly Groomed  Eye Contact:  Fair  Speech:  Clear and Coherent  Volume:  Normal  Mood:  Anxious, Depressed, and Irritable  Affect:  Congruent  Thought Process:  Goal Directed and Descriptions of Associations: Intact  Orientation:  Full (Time, Place, and Person)  Thought Content: Logical   Suicidal Thoughts:  No  Homicidal Thoughts:  No  Memory:  Immediate;   Fair Recent;   Fair Remote;   Fair   Judgement:  Fair  Insight:  Fair  Psychomotor Activity:  Normal  Concentration:  Concentration: Fair and Attention Span: Fair  Recall:  [employer] of Knowledge: Fair  Language: Fair  Akathisia:  No  Handed:  Right  AIMS (if indicated): done  Assets:  Communication Skills Desire for Improvement Housing Social Support  ADL's:  Intact  Cognition: WNL  Sleep:  Poor   Screenings: Geneticist, molecular Office Visit from 01/07/2023 in [facility] Office Visit from 09/15/2022 in [facility] Video Visit from 02/04/2022 in [facility] Office Visit from 12/30/2021 in [facility] Office Visit from 11/18/2021 in [facility]  AIMS Total Score 0 0 0 0 0      GAD-7    Flowsheet Row Office Visit from 01/07/2023 in [facility] Office Visit from 09/15/2022 in [facility] Counselor from 06/23/2022 in [facility] Counselor from 05/18/2022 in [facility] Counselor from 05/04/2022 in [facility]  Total GAD-7 Score 13 12 10 18 16       PHQ2-9    Flowsheet Row Office Visit from 01/07/2023 in [facility] Office Visit from 09/15/2022 in [facility] Counselor from 06/23/2022 in [facility] Counselor from 06/10/2022 in [facility] Counselor from 05/18/2022 in [facility]  PHQ-2 Total Score 4 3 2 5 5   PHQ-9 Total Score 11 5 4 16 12       Flowsheet Row Office Visit from 01/07/2023 in [facility] ED from 12/15/2022 in [facility] Department at [facility] Visit from 09/15/2022 in [facility]  C-SSRS RISK CATEGORY No Risk No Risk Low Risk        Assessment and Plan: BERTHAMAE KONDO is a 57 year old Caucasian female who has a history of MDD, PTSD, multiple medical problems was evaluated in office today.  Patient currently with  worsening mood symptoms, irritability, sleep problems likely multifactorial including being primary caregiver for her mother as well as medical issues including pain, she will benefit from the following plan.  Plan MDD-unstable Sertraline 50 mg p.o. daily, patient has started the higher dosage yesterday. Discontinue Cymbalta-tapered off. Increase Rexulti to 0.5 mg p.o. daily She is also on gabapentin for pain although it is also a mood stabilizer. Patient will benefit from CBT-provided information for Ms. Christine Mcneil at [phone].  GAD-unstable Sertraline 50 mg p.o. daily Hydroxyzine 25-50 mg p.o. twice daily as needed Referral for CBT  PTSD-unstable Patient advised to establish care with therapist.  Insomnia-unstable  Due to need to urinate at night as well as pain Patient to work on sleep hygiene She will need sufficient pain management Continue Belsomra 20 mg p.o. nightly Reviewed [place]MP AWARxE   Collaboration of Care: Collaboration of Care: Referral or follow-up with counselor/therapist AEB patient encouraged to establish care with therapist.  Patient/Guardian was advised Release of Information must be obtained prior to any record release in order to collaborate their care with an outside provider. Patient/Guardian was advised if they have not already done so to contact the registration department to sign all necessary forms in order for [name] to release information regarding their care.   Consent: Patient/Guardian gives verbal consent for treatment and assignment of  benefits for services provided during this visit. Patient/Guardian expressed understanding and agreed to proceed.   Follow-up in clinic in 2 weeks or sooner if needed.  This note was generated in part or whole with voice recognition software. Voice recognition is usually quite accurate but there are transcription errors that can and very often do occur. I apologize for any typographical errors that were not detected and corrected.    Jomarie Longs, MD 01/26/2023, 4:01 PM

## 2023-01-31 ENCOUNTER — Inpatient Hospital Stay: Payer: 59 | Attending: Oncology

## 2023-02-03 DIAGNOSIS — I890 Lymphedema, not elsewhere classified: Secondary | ICD-10-CM | POA: Insufficient documentation

## 2023-02-15 ENCOUNTER — Telehealth: Payer: Self-pay

## 2023-02-15 NOTE — Telephone Encounter (Signed)
Pt called reporting that she has been experiencing RUQ abd pain and upper back pain in addition to nausea, similar Sx to when she had to have an ERCP back in 2022,   Endocrinologist also reports her liver enzymes are elevated as well....   I can put her on the schedule for this Thursday if appropriate...  Please advise

## 2023-02-15 NOTE — Telephone Encounter (Signed)
Pt scheduled for 10/1 for an OV

## 2023-02-17 ENCOUNTER — Other Ambulatory Visit: Payer: Self-pay | Admitting: Psychiatry

## 2023-02-17 DIAGNOSIS — F4310 Post-traumatic stress disorder, unspecified: Secondary | ICD-10-CM

## 2023-02-17 DIAGNOSIS — F331 Major depressive disorder, recurrent, moderate: Secondary | ICD-10-CM

## 2023-02-17 DIAGNOSIS — F411 Generalized anxiety disorder: Secondary | ICD-10-CM

## 2023-02-21 ENCOUNTER — Ambulatory Visit: Payer: 59 | Admitting: Psychiatry

## 2023-02-21 ENCOUNTER — Encounter: Payer: Self-pay | Admitting: Psychiatry

## 2023-02-21 ENCOUNTER — Ambulatory Visit (INDEPENDENT_AMBULATORY_CARE_PROVIDER_SITE_OTHER): Payer: 59 | Admitting: Psychiatry

## 2023-02-21 ENCOUNTER — Ambulatory Visit
Admission: RE | Admit: 2023-02-21 | Discharge: 2023-02-21 | Disposition: A | Discharge status: Home / Self Care | Payer: 59 | Source: Ambulatory Visit | Attending: Psychiatry | Admitting: Psychiatry

## 2023-02-21 VITALS — BP 128/88 | HR 83 | Temp 98.5°F | Ht 66.0 in | Wt 241.6 lb

## 2023-02-21 DIAGNOSIS — F331 Major depressive disorder, recurrent, moderate: Secondary | ICD-10-CM

## 2023-02-21 DIAGNOSIS — F4310 Post-traumatic stress disorder, unspecified: Secondary | ICD-10-CM | POA: Diagnosis not present

## 2023-02-21 DIAGNOSIS — D6852 Prothrombin gene mutation: Secondary | ICD-10-CM | POA: Insufficient documentation

## 2023-02-21 DIAGNOSIS — Z5181 Encounter for therapeutic drug level monitoring: Secondary | ICD-10-CM | POA: Diagnosis not present

## 2023-02-21 DIAGNOSIS — G4701 Insomnia due to medical condition: Secondary | ICD-10-CM

## 2023-02-21 DIAGNOSIS — F411 Generalized anxiety disorder: Secondary | ICD-10-CM

## 2023-02-21 DIAGNOSIS — Z9189 Other specified personal risk factors, not elsewhere classified: Secondary | ICD-10-CM | POA: Insufficient documentation

## 2023-02-21 DIAGNOSIS — I4581 Long QT syndrome: Secondary | ICD-10-CM | POA: Diagnosis present

## 2023-02-21 MED ORDER — SERTRALINE HCL 100 MG PO TABS: 100.0000 mg | ORAL_TABLET | Freq: Every day | ORAL | 0 refills | Status: DC

## 2023-02-21 NOTE — Progress Notes (Signed)
BH MD OP Progress Note  02/21/2023 12:06 PM DIVINITY KYLER  MRN:  [id number]  Chief Complaint:  Chief Complaint  Patient presents with   Follow-up   Anxiety   Depression   Medication Refill   HPI: FARHEEN PFAHLER is a 57 year old Caucasian female, divorced, lives in [place], has a history of MDD, PTSD, insomnia, OSA, history of CVA, history of prothrombin gene mutation, chronic pain, migraine headaches, vitamin B12 deficiency, iron deficiency was evaluated in office today.  Patient today appeared to be sad, tearful in session.  Reports she continues to struggle with a lot of pain, currently going through a flareup.  Her current pain medications does not seem to be working.  She did not sleep at all last night due to her pain.  She has reached out to her pain provider.  She also continues to struggle with the fact that her mother needs a lot of support and she is the primary care giver.  She also has a son who has autism who needs a lot of support.  Hence she does not get any break at all.  She reports she has been struggling with irritability, sadness, crying spells, anger issues.  She is currently on the higher dosage of Rexulti which may be helpful to some extent.  She has not been able to find a therapist.  She however is motivated to do so.  She is currently compliant on the Zoloft 50 mg daily.  Does report GI problems, nausea since the past 2 weeks however does not believe it is due to the Zoloft.  She has upcoming appointment with a GI provider.  She does have a history of IBS.  She is agreeable to dosage increase of Zoloft today and will monitor her symptoms.  Patient denies any suicidality, homicidality or perceptual disturbances.  Patient denies any other concerns today.  Visit Diagnosis:    ICD-10-CM   1. MDD (major depressive disorder), recurrent episode, moderate (HCC)  F33.1 sertraline (ZOLOFT) 100 MG tablet    2. PTSD (post-traumatic stress disorder)  F43.10 sertraline (ZOLOFT) 100  MG tablet    3. GAD (generalized anxiety disorder)  F41.1 sertraline (ZOLOFT) 100 MG tablet    4. Insomnia due to medical condition  G47.01    pain, anxiety    5. At risk for prolonged QT interval syndrome  Z91.89 EKG 12-Lead      Past Psychiatric History: I have reviewed past psychiatric history from my progress note on 07/19/2017.  Past trials of medications like Effexor, Klonopin.  Past Medical History:  Past Medical History:  Diagnosis Date   Abdominal pain    Anxiety and depression    Atypical facial pain    Bilateral occipital neuralgia    Cervical spondylosis without myelopathy    Chronic daily headache    Chronic pain    Chronic pelvic pain in female    Chronic thoracic back pain    Depression    Diabetes mellitus without complication (HCC)    Diabetes mellitus, type II (HCC)    Dysphagia    Dysrhythmia    Fibromyalgia    Hyperlipidemia    Hypertension    Hypothyroidism    Insomnia    Iron deficiency anemia    Left hip pain    Low back pain    Migraines    Numbness    Optic neuropathy    OSA (obstructive sleep apnea)    Polyarthralgia    Primary osteoarthritis of both hips  Prothrombin gene mutation (HCC)    Pseudotumor cerebri    Rectal bleeding    Right shoulder pain    Rotator cuff syndrome    Stroke [facility])    Thyroid disease    TIA (transient ischemic attack) 05/27/2017    Past Surgical History:  Procedure Laterality Date   ABDOMINAL HYSTERECTOMY     total   CHOLECYSTECTOMY     COLONOSCOPY WITH PROPOFOL N/A 03/21/2018   Procedure: COLONOSCOPY WITH PROPOFOL;  Surgeon: Midge Minium, MD;  Location: [facility] ENDOSCOPY;  Service: Endoscopy;  Laterality: N/A;   ERCP N/A 08/19/2020   Procedure: ENDOSCOPIC RETROGRADE CHOLANGIOPANCREATOGRAPHY (ERCP);  Surgeon: Midge Minium, MD;  Location: [facility] ENDOSCOPY;  Service: Endoscopy;  Laterality: N/A;   ESOPHAGOGASTRODUODENOSCOPY (EGD) WITH PROPOFOL N/A 03/21/2018   Procedure: ESOPHAGOGASTRODUODENOSCOPY (EGD) WITH  PROPOFOL;  Surgeon: Midge Minium, MD;  Location: [facility] ENDOSCOPY;  Service: Endoscopy;  Laterality: N/A;   GIVENS CAPSULE STUDY N/A 09/24/2019   Procedure: GIVENS CAPSULE STUDY;  Surgeon: Pasty Spillers, MD;  Location: [facility] ENDOSCOPY;  Service: Endoscopy;  Laterality: N/A;   KNEE SURGERY Left    LEFT HEART CATH AND CORONARY ANGIOGRAPHY N/A 03/10/2022   Procedure: LEFT HEART CATH AND CORONARY ANGIOGRAPHY;  Surgeon: Alwyn Pea, MD;  Location: [facility] INVASIVE CV LAB;  Service: Cardiovascular;  Laterality: N/A;   ROTATOR CUFF REPAIR Right    spinal neurostimulator      Family Psychiatric History: I have reviewed family psychiatric history from progress note on 07/19/2017.  Family History:  Family History  Problem Relation Age of Onset   Diabetes Mother    Hyperlipidemia Mother    Hypertension Mother    COPD Mother    CVA Mother    Anemia Mother    Diabetes Father    Hyperlipidemia Father    Hypertension Father    Thyroid disease Father    Alcohol abuse Father    Peripheral vascular disease Sister    Hypertension Sister    Anxiety disorder Son    Depression Son    Bipolar disorder Son    Seizures Son     Social History: I have reviewed social history from progress note on 07/19/2017. Social History   Socioeconomic History   Marital status: Divorced    Spouse name: Not on file   Number of children: 1   Years of education: Not on file   Highest education level: High school graduate  Occupational History    Comment: disablitiy  Tobacco Use   Smoking status: Never   Smokeless tobacco: Never  Vaping Use   Vaping status: Never Used  Substance and Sexual Activity   Alcohol use: No   Drug use: No   Sexual activity: Not Currently  Other Topics Concern   Not on file  Social History Narrative   Not on file   Social Determinants of Health   Financial Resource Strain: Low Risk  (07/19/2017)   Overall Financial Resource Strain (CARDIA)    Difficulty of Paying  Living Expenses: Not hard at all  Food Insecurity: No Food Insecurity (07/19/2017)   Hunger Vital Sign    Worried About Running Out of Food in the Last Year: Never true    Ran Out of Food in the Last Year: Never true  Transportation Needs: No Transportation Needs (07/19/2017)   PRAPARE - Administrator, Civil Service (Medical): No    Lack of Transportation (Non-Medical): No  Physical Activity: Inactive (07/19/2017)   Exercise Vital Sign  Days of Exercise per Week: 0 days    Minutes of Exercise per Session: 0 min  Stress: Stress Concern Present (07/19/2017)   [employer] of Occupational Health - Occupational Stress Questionnaire    Feeling of Stress : Very much  Social Connections: Moderately Isolated (07/19/2017)   Social Connection and Isolation Panel [NHANES]    Frequency of Communication with Friends and Family: Once a week    Frequency of Social Gatherings with Friends and Family: Never    Attends Religious Services: More than 4 times per year    Active Member of [employer] or Organizations: No    Attends Banker Meetings: Never    Marital Status: Divorced    Allergies:  Allergies  Allergen Reactions   Amoxapine Itching   Amoxicillin Itching   Dapagliflozin Itching    Other reaction(s): Other (See Comments) Thrush & vaginal itching   Keflex [Cephalexin] Itching   Mirtazapine Other (See Comments)    Pt states felling "like my body is outside of me."    Metabolic Disorder Labs: Lab Results  Component Value Date   HGBA1C 9.9 (H) 06/21/2017   MPG 237.43 06/21/2017   Lab Results  Component Value Date   PROLACTIN 4.3 (L) 05/26/2021   PROLACTIN 5.5 09/06/2017   Lab Results  Component Value Date   CHOL 157 05/26/2021   TRIG 245 (H) 05/26/2021   HDL 32 (L) 05/26/2021   CHOLHDL 4.9 (H) 05/26/2021   VLDL 39 06/21/2017   LDLCALC 84 05/26/2021   LDLCALC 174 (H) 06/21/2017   Lab Results  Component Value Date   TSH 2.890 09/06/2017   TSH 3.176  01/04/2017    Therapeutic Level Labs: No results found for: "LITHIUM" No results found for: "VALPROATE" No results found for: "CBMZ"  Current Medications: Current Outpatient Medications  Medication Sig Dispense Refill   ACCU-CHEK AVIVA PLUS test strip      ACCU-CHEK SOFTCLIX LANCETS lancets      Acetaminophen (TYLENOL ARTHRITIS EXT RELIEF PO) Take 600 mg by mouth daily.     albuterol (VENTOLIN HFA) 108 (90 Base) MCG/ACT inhaler Inhale 2 puffs into the lungs every 6 (six) hours as needed for wheezing or shortness of breath. 1 g 0   atorvastatin (LIPITOR) 80 MG tablet Take 80 mg by mouth daily at 6 PM.     BELSOMRA 20 MG TABS TAKE 1 TABLET BY MOUTH EVERYDAY AT BEDTIME 30 tablet 2   Blood Glucose Monitoring Suppl (ACCU-CHEK AVIVA PLUS) w/Device KIT      Brexpiprazole (REXULTI) 0.5 MG TABS Take 1 tablet (0.5 mg total) by mouth daily. 30 tablet 1   Carboxymethylcellulose Sod PF 0.5 % SOLN Apply 1 drop to eye daily as needed.     Cholecalciferol (VITAMIN D3) 1000 units CAPS Take 1 capsule by mouth daily.     clobetasol ointment (TEMOVATE) 0.05 % Apply topically.     Continuous Blood Gluc Receiver (DEXCOM G6 RECEIVER) DEVI Use to monitor blood sugar. NDC [phone]     Continuous Blood Gluc Sensor (DEXCOM G6 SENSOR) MISC Use to monitor blood sugar.  Replace every 10 days. NDC [phone].     Continuous Blood Gluc Transmit (DEXCOM G6 TRANSMITTER) MISC Use to monitor blood sugar.  Replace every 3 months. NDC [phone].     cyclobenzaprine (FLEXERIL) 5 MG tablet Take 1 tablet by mouth 3 (three) times daily as needed.     DILT-XR 240 MG 24 hr capsule Take 240 mg by mouth daily.  esomeprazole (NEXIUM) 40 MG capsule Take 40 mg by mouth daily.     ezetimibe (ZETIA) 10 MG tablet Take 10 mg by mouth at bedtime.     famotidine (PEPCID) 40 MG tablet Take 40 mg by mouth at bedtime.     ferrous sulfate 325 (65 FE) MG tablet Take by mouth. Take 325 mg daily with breakfast     fluticasone  (FLONASE) 50 MCG/ACT nasal spray Place 2 sprays into both nostrils as needed.      furosemide (LASIX) 40 MG tablet TAKE (1) TABLET BY MOUTH EVERY DAY     gabapentin (NEURONTIN) 600 MG tablet TAKE 1 TABLET (600 MG TOTAL) BY MOUTH DAILY. (DOSE CHANGE) 90 tablet 0   HUMALOG KWIKPEN 100 UNIT/ML KwikPen SMARTSIG:0-50 Unit(s) SUB-Q Daily     hydrOXYzine (VISTARIL) 25 MG capsule Take 1-2 capsules (25-50 mg total) by mouth 2 (two) times daily as needed for anxiety (and sleep). 360 capsule 0   insulin degludec (TRESIBA FLEXTOUCH) 200 UNIT/ML FlexTouch Pen Inject into the skin.     Insulin Disposable Pump (OMNIPOD 5 G6 INTRO, GEN 5,) KIT SMARTSIG:1 SUB-Q Every 3 Days     Insulin Lispro w/ Trans Port 100 UNIT/ML SOPN INJECT UP TO 50 UNITS DAILY DIVIDED DOSES, AS DIRECTED     Insulin Pen Needle (TRUEPLUS PEN NEEDLES) 29G X MISC      levothyroxine (SYNTHROID) 112 MCG tablet Take 112 mcg by mouth daily.     lidocaine 4 % 1 patch daily.     magnesium oxide (MAG-OX) 400 MG tablet Take 1 tablet by mouth daily.     Melatonin 10 MG TABS Take 2 tablets by mouth at bedtime.     meloxicam (MOBIC) 15 MG tablet Take 15 mg by mouth daily.     metFORMIN (GLUCOPHAGE-XR) 500 MG 24 hr tablet Take 500 mg by mouth 2 (two) times daily.      metoprolol succinate (TOPROL-XL) 25 MG 24 hr tablet Take 1 tablet by mouth daily.     mometasone (ELOCON) 0.1 % cream Apply topically.     naloxone (NARCAN) nasal spray 4 mg/0.1 mL Place into the nose.     nystatin (MYCOSTATIN) 100000 UNIT/ML suspension Take 5 mLs by mouth 4 (four) times daily.     omeprazole (PRILOSEC) 20 MG capsule TAKE (1) CAPSULE BY MOUTH EVERY DAY 30 capsule 0   ondansetron (ZOFRAN) 4 MG tablet Take 4 mg by mouth every 8 (eight) hours as needed.     pioglitazone (ACTOS) 15 MG tablet Take by mouth. Take 1 tablet by mouth once daily     RYBELSUS 7 MG TABS Take 1 tablet by mouth daily.     sertraline (ZOLOFT) 100 MG tablet Take 1 tablet (100 mg total) by mouth  daily with breakfast. 90 tablet 0   vitamin B-12 (CYANOCOBALAMIN) 100 MCG tablet Take 1 tablet (100 mcg total) by mouth daily. 90 tablet 1   XTAMPZA ER 9 MG C12A PLEASE SEE ATTACHED FOR DETAILED DIRECTIONS     zonisamide (ZONEGRAN) 100 MG capsule Take 100 mg by mouth daily.     dabigatran (PRADAXA) 150 MG CAPS capsule Take 150 mg by mouth 2 (two) times daily.     nitroGLYCERIN (NITROSTAT) 0.4 MG SL tablet Place under the tongue.     No current facility-administered medications for this visit.   Facility-Administered Medications Ordered in Other Visits  Medication Dose Route Frequency Provider Last Rate Last Admin   iohexol (OMNIPAQUE) 300 MG/ML solution  PRN Dorothyann Peng D, MD   60 mL at 03/10/22 1442     Musculoskeletal: Strength & Muscle Tone: within normal limits Gait & Station: normal Patient leans: N/A  Psychiatric Specialty Exam: Review of Systems  Musculoskeletal:  Positive for myalgias.       Generalized pain all over her body per report.  Psychiatric/Behavioral:  Positive for decreased concentration, dysphoric mood and sleep disturbance. The patient is nervous/anxious.     Blood pressure 128/88, pulse 83, temperature 98.5 F (36.9 C), temperature source Temporal, height 5\' 6"  (1.676 m), weight 241 lb 9.6 oz (109.6 kg), SpO2 97%.Body mass index is 39 kg/m.  General Appearance: Casual  Eye Contact:  Fair  Speech:  Clear and Coherent  Volume:  Normal  Mood:  Anxious and Depressed  Affect:  Tearful  Thought Process:  Goal Directed and Descriptions of Associations: Intact  Orientation:  Full (Time, Place, and Person)  Thought Content: Rumination   Suicidal Thoughts:  No  Homicidal Thoughts:  No  Memory:  Immediate;   Fair Recent;   Fair Remote;   Fair  Judgement:  Fair  Insight:  Fair  Psychomotor Activity:  Normal  Concentration:  Concentration: Fair and Attention Span: Fair  Recall:  [employer] of Knowledge: Fair  Language: Fair  Akathisia:  No  Handed:   Right  AIMS (if indicated): done  Assets:  Communication Skills Desire for Improvement Housing Social Support  ADL's:  Intact  Cognition: WNL  Sleep:  Poor due to pain   Screenings: AIMS    Loss adjuster, chartered Office Visit from 02/21/2023 in [facility] Office Visit from 01/26/2023 in [facility] Office Visit from 01/07/2023 in [facility] Office Visit from 09/15/2022 in [facility] Video Visit from 02/04/2022 in [facility]  AIMS Total Score 0 0 0 0 0      GAD-7    Flowsheet Row Office Visit from 02/21/2023 in [facility] Office Visit from 01/26/2023 in [facility] Office Visit from 01/07/2023 in [facility] Office Visit from 09/15/2022 in [facility] Counselor from 06/23/2022 in [facility]  Total GAD-7 Score 19 18 13 12 10       PHQ2-9    Flowsheet Row Office Visit from 02/21/2023 in [facility] Office Visit from 01/26/2023 in [facility] Office Visit from 01/07/2023 in [facility] Office Visit from 09/15/2022 in [facility] Counselor from 06/23/2022 in [facility]  PHQ-2 Total Score 6 6 4 3 2   PHQ-9 Total Score 15 14 11 5 4       Flowsheet Row Office Visit from 02/21/2023 in [facility] Office Visit from 01/26/2023 in [facility] Office Visit from 01/07/2023 in [facility]  C-SSRS  RISK CATEGORY Low Risk No Risk No Risk        Assessment and Plan: ELLESSE ANTENUCCI is a 57 year old Caucasian female who has a history of MDD, PTSD, multiple medical problems was evaluated in office today.  Patient with continued irritability, sleep problems, multifactorial including being primary caregiver for her mother, having a son with autism as well as pain, she  will benefit from continued medication management as well as psychotherapy sessions as well as sufficient pain management.  Plan as noted below.  Plan MDD-unstable Increase sertraline to 100 mg p.o. daily We will consider increasing Rexulti however will need an EKG first.  Continue Rexulti 0.5 mg p.o. daily for now. Patient is also on gabapentin which is for pain although a mood stabilizer. Patient encouraged to establish care with therapist, provided information for Ms. Phyllis Ginger at [phone] last visit.  GAD-unstable Increase sertraline to 100 mg p.o. daily Hydroxyzine 25-50 mg p.o. twice daily as needed Referred for CBT-patient encouraged to establish care.  PTSD-unstable Patient advised to establish care with therapist Continue sertraline.  Insomnia-unstable Patient's sleep problems due to pain as well as need to urinate at night. Continue sleep hygiene techniques Will need sufficient pain management Continue Belsomra 20 mg p.o. nightly Reviewed [place] PMP AWARxE  At risk for prolonged QT syndrome-we will order EKG.  Patient to call [phone].  Follow-up in clinic in 3 to 4 weeks or sooner if needed. Collaboration of Care: Collaboration of Care: Referral or follow-up with counselor/therapist AEB patient encouraged to establish care with therapist.  Patient/Guardian was advised Release of Information must be obtained prior to any record release in order to collaborate their care with an outside provider. Patient/Guardian was advised if they have not already done so to contact the registration department to sign all  necessary forms in order for [name] to release information regarding their care.   Consent: Patient/Guardian gives verbal consent for treatment and assignment of benefits for services provided during this visit. Patient/Guardian expressed understanding and agreed to proceed.   This note was generated in part or whole with voice recognition software. Voice recognition is usually quite accurate but there are transcription errors that can and very often do occur. I apologize for any typographical errors that were not detected and corrected.    Jomarie Longs, MD 02/21/2023, 12:06 PM

## 2023-02-21 NOTE — Patient Instructions (Signed)
Please call for EKG-[id number] 

## 2023-02-22 ENCOUNTER — Telehealth: Payer: Self-pay | Admitting: Psychiatry

## 2023-02-22 ENCOUNTER — Ambulatory Visit (INDEPENDENT_AMBULATORY_CARE_PROVIDER_SITE_OTHER): Payer: 59 | Admitting: Gastroenterology

## 2023-02-22 ENCOUNTER — Encounter: Payer: Self-pay | Admitting: Gastroenterology

## 2023-02-22 VITALS — BP 147/90 | HR 80 | Temp 98.3°F | Ht 66.0 in | Wt 244.0 lb

## 2023-02-22 DIAGNOSIS — R1030 Lower abdominal pain, unspecified: Secondary | ICD-10-CM

## 2023-02-22 DIAGNOSIS — R112 Nausea with vomiting, unspecified: Secondary | ICD-10-CM | POA: Diagnosis not present

## 2023-02-22 DIAGNOSIS — F331 Major depressive disorder, recurrent, moderate: Secondary | ICD-10-CM

## 2023-02-22 DIAGNOSIS — R1011 Right upper quadrant pain: Secondary | ICD-10-CM

## 2023-02-22 DIAGNOSIS — R748 Abnormal levels of other serum enzymes: Secondary | ICD-10-CM

## 2023-02-22 MED ORDER — BREXPIPRAZOLE 0.25 MG PO TABS
0.2500 mg | ORAL_TABLET | Freq: Every day | ORAL | 1 refills | Status: DC
Start: 2023-02-22 — End: 2023-04-29

## 2023-02-22 MED ORDER — DICYCLOMINE HCL 20 MG PO TABS
20.0000 mg | ORAL_TABLET | Freq: Three times a day (TID) | ORAL | 5 refills | Status: DC
Start: 2023-02-22 — End: 2023-08-29

## 2023-02-22 NOTE — Progress Notes (Signed)
Primary Care Physician: Doughton, Margit Banda, MD  Primary Gastroenterologist:  Dr. Midge Minium  Chief Complaint  Patient presents with   Abdominal Pain   Nausea    HPI: Christine Mcneil is a 57 y.o. female here with abdominal pain and nausea.  The patient states the nausea at can come and go.  The patient has a history of diabetes and also was seen by her endocrinologist to recommend the patient go see a hepatologist at [facility] stating that she needs to be evaluated for cirrhosis.  The patient is quite concerned about this.  The patient reports that she has abdominal pain mostly in the lower abdomen and she reports it to be crampy abdominal pain. There is no report of any unexplained weight loss fevers chills black stools or bloody stools.  Past Medical History:  Diagnosis Date   Abdominal pain    Anxiety and depression    Atypical facial pain    Bilateral occipital neuralgia    Cervical spondylosis without myelopathy    Chronic daily headache    Chronic pain    Chronic pelvic pain in female    Chronic thoracic back pain    Depression    Diabetes mellitus without complication (HCC)    Diabetes mellitus, type II (HCC)    Dysphagia    Dysrhythmia    Fibromyalgia    Hyperlipidemia    Hypertension    Hypothyroidism    Insomnia    Iron deficiency anemia    Left hip pain    Low back pain    Migraines    Numbness    Optic neuropathy    OSA (obstructive sleep apnea)    Polyarthralgia    Primary osteoarthritis of both hips    Prothrombin gene mutation (HCC)    Pseudotumor cerebri    Rectal bleeding    Right shoulder pain    Rotator cuff syndrome    Stroke [facility])    Thyroid disease    TIA (transient ischemic attack) 05/27/2017    Current Outpatient Medications  Medication Sig Dispense Refill   ACCU-CHEK AVIVA PLUS test strip      ACCU-CHEK SOFTCLIX LANCETS lancets      Acetaminophen (TYLENOL ARTHRITIS EXT RELIEF PO) Take 600 mg by mouth daily.     albuterol (VENTOLIN HFA)  108 (90 Base) MCG/ACT inhaler Inhale 2 puffs into the lungs every 6 (six) hours as needed for wheezing or shortness of breath. 1 g 0   atorvastatin (LIPITOR) 80 MG tablet Take 80 mg by mouth daily at 6 PM.     BELSOMRA 20 MG TABS TAKE 1 TABLET BY MOUTH EVERYDAY AT BEDTIME 30 tablet 2   Blood Glucose Monitoring Suppl (ACCU-CHEK AVIVA PLUS) w/Device KIT      brexpiprazole (REXULTI) 0.25 MG TABS tablet Take 1 tablet (0.25 mg total) by mouth daily. Take along with 0.5 mg daily , total of 0.75 mg daily. 30 tablet 1   Brexpiprazole (REXULTI) 0.5 MG TABS Take 1 tablet (0.5 mg total) by mouth daily. 30 tablet 1   Carboxymethylcellulose Sod PF 0.5 % SOLN Apply 1 drop to eye daily as needed.     Cholecalciferol (VITAMIN D3) 1000 units CAPS Take 1 capsule by mouth daily.     clobetasol ointment (TEMOVATE) 0.05 % Apply topically.     Continuous Blood Gluc Receiver (DEXCOM G6 RECEIVER) DEVI Use to monitor blood sugar. NDC [id number]     Continuous Blood Gluc Sensor (DEXCOM G6 SENSOR) MISC Use  to monitor blood sugar.  Replace every 10 days. NDC [phone].     Continuous Blood Gluc Transmit (DEXCOM G6 TRANSMITTER) MISC Use to monitor blood sugar.  Replace every 3 months. NDC [phone].     cyclobenzaprine (FLEXERIL) 5 MG tablet Take 1 tablet by mouth 3 (three) times daily as needed.     DILT-XR 240 MG 24 hr capsule Take 240 mg by mouth daily.     esomeprazole (NEXIUM) 40 MG capsule Take 40 mg by mouth daily.     ezetimibe (ZETIA) 10 MG tablet Take 10 mg by mouth at bedtime.     famotidine (PEPCID) 40 MG tablet Take 40 mg by mouth at bedtime.     ferrous sulfate 325 (65 FE) MG tablet Take by mouth. Take 325 mg daily with breakfast     fluticasone (FLONASE) 50 MCG/ACT nasal spray Place 2 sprays into both nostrils as needed.      furosemide (LASIX) 40 MG tablet TAKE (1) TABLET BY MOUTH EVERY DAY     gabapentin (NEURONTIN) 600 MG tablet TAKE 1 TABLET (600 MG TOTAL) BY MOUTH DAILY. (DOSE CHANGE) 90 tablet  0   HUMALOG KWIKPEN 100 UNIT/ML KwikPen SMARTSIG:0-50 Unit(s) SUB-Q Daily     hydrOXYzine (VISTARIL) 25 MG capsule Take 1-2 capsules (25-50 mg total) by mouth 2 (two) times daily as needed for anxiety (and sleep). 360 capsule 0   insulin degludec (TRESIBA FLEXTOUCH) 200 UNIT/ML FlexTouch Pen Inject into the skin.     Insulin Disposable Pump (OMNIPOD 5 G6 INTRO, GEN 5,) KIT SMARTSIG:1 SUB-Q Every 3 Days     Insulin Lispro w/ Trans Port 100 UNIT/ML SOPN INJECT UP TO 50 UNITS DAILY DIVIDED DOSES, AS DIRECTED     Insulin Pen Needle (TRUEPLUS PEN NEEDLES) 29G X MISC      levothyroxine (SYNTHROID) 112 MCG tablet Take 112 mcg by mouth daily.     lidocaine 4 % 1 patch daily.     magnesium oxide (MAG-OX) 400 MG tablet Take 1 tablet by mouth daily.     Melatonin 10 MG TABS Take 2 tablets by mouth at bedtime.     meloxicam (MOBIC) 15 MG tablet Take 15 mg by mouth daily.     metFORMIN (GLUCOPHAGE-XR) 500 MG 24 hr tablet Take 500 mg by mouth 2 (two) times daily.      metoprolol succinate (TOPROL-XL) 25 MG 24 hr tablet Take 1 tablet by mouth daily.     mometasone (ELOCON) 0.1 % cream Apply topically.     naloxone (NARCAN) nasal spray 4 mg/0.1 mL Place into the nose.     nystatin (MYCOSTATIN) 100000 UNIT/ML suspension Take 5 mLs by mouth 4 (four) times daily.     omeprazole (PRILOSEC) 20 MG capsule TAKE (1) CAPSULE BY MOUTH EVERY DAY 30 capsule 0   ondansetron (ZOFRAN) 4 MG tablet Take 4 mg by mouth every 8 (eight) hours as needed.     RYBELSUS 7 MG TABS Take 1 tablet by mouth daily.     sertraline (ZOLOFT) 100 MG tablet Take 1 tablet (100 mg total) by mouth daily with breakfast. 90 tablet 0   vitamin B-12 (CYANOCOBALAMIN) 100 MCG tablet Take 1 tablet (100 mcg total) by mouth daily. 90 tablet 1   XTAMPZA ER 9 MG C12A PLEASE SEE ATTACHED FOR DETAILED DIRECTIONS     zonisamide (ZONEGRAN) 100 MG capsule Take 100 mg by mouth daily.     dabigatran (PRADAXA) 150 MG CAPS capsule Take 150 mg by mouth 2 (  two)  times daily.     nitroGLYCERIN (NITROSTAT) 0.4 MG SL tablet Place under the tongue.     pioglitazone (ACTOS) 15 MG tablet Take by mouth. Take 1 tablet by mouth once daily     No current facility-administered medications for this visit.   Facility-Administered Medications Ordered in Other Visits  Medication Dose Route Frequency Provider Last Rate Last Admin   iohexol (OMNIPAQUE) 300 MG/ML solution    PRN Callwood, Dwayne D, MD   60 mL at 03/10/22 1442    Allergies as of 02/22/2023 - Review Complete 02/22/2023  Allergen Reaction Noted   Amoxapine Itching 09/16/2015   Amoxicillin Itching    Dapagliflozin Itching 11/07/2017   Keflex [cephalexin] Itching 09/16/2015   Mirtazapine Other (See Comments) 06/08/2019    ROS:  General: Negative for anorexia, weight loss, fever, chills, fatigue, weakness. ENT: Negative for hoarseness, difficulty swallowing , nasal congestion. CV: Negative for chest pain, angina, palpitations, dyspnea on exertion, peripheral edema.  Respiratory: Negative for dyspnea at rest, dyspnea on exertion, cough, sputum, wheezing.  GI: See history of present illness. GU:  Negative for dysuria, hematuria, urinary incontinence, urinary frequency, nocturnal urination.  Endo: Negative for unusual weight change.    Physical Examination:   BP (!) 147/90 (BP Location: Left Arm, Patient Position: Sitting, Cuff Size: Large)   Pulse 80   Temp 98.3 F (36.8 C) (Oral)   Ht 5\' 6"  (1.676 m)   Wt 244 lb (110.7 kg)   LMP  (LMP Unknown)   BMI 39.38 kg/m   General: Well-nourished, well-developed in no acute distress.  Eyes: No icterus. Conjunctivae pink. Lungs: Clear to auscultation bilaterally. Non-labored. Heart: Regular rate and rhythm, no murmurs rubs or gallops.  Abdomen: Bowel sounds are normal, nontender, nondistended, no hepatosplenomegaly or masses, no abdominal bruits or hernia , no rebound or guarding.   Extremities: No lower extremity edema. No clubbing or  deformities. Neuro: Alert and oriented x 3.  Grossly intact. Skin: Warm and dry, no jaundice.   Psych: Alert and cooperative, normal mood and affect.  Labs:    Imaging Studies: No results found.  Assessment and Plan:   Christine Mcneil is a 57 y.o. y/o female who comes in with nausea vomiting abdominal pain.  The patient will be set up for an EGD to see if there is a cause for the nausea vomiting.  The patient may need a gastric emptying study with her history of diabetes.  The patient will also be started on dicyclomine for her abdominal cramps.  The patient also has been told that I take care of liver disease and we can send off the acid that she does not need to follow-up with [facility].  The patient will have a fibrosis assay sent off including fib 4 and FibroSure.  If the patient has any signs of cirrhosis we can then have her follow-up with a transplant hepatology center.  The patient will follow-up at the time of the upper endoscopy.  The patient has been explained the plan and agrees with it.     Midge Minium, MD. Clementeen Graham    Note: This dictation was prepared with Dragon dictation along with smaller phrase technology. Any transcriptional errors that result from this process are unintentional.

## 2023-02-22 NOTE — Telephone Encounter (Signed)
I have sent Rexulti 0.25 mg extra dose for a total dose of 0.75 mg daily.  Reviewed and discussed EKG with patient.

## 2023-02-22 NOTE — Telephone Encounter (Signed)
I have reviewed EKG-dated 02/21/2023-normal sinus rhythm, QTc-444.

## 2023-02-28 ENCOUNTER — Telehealth: Payer: Self-pay | Admitting: Gastroenterology

## 2023-02-28 NOTE — Telephone Encounter (Signed)
The patient is calling about the result for her labs. The patient wanted to know if she needs to keep her appointment at [facility] regarding the labs for her liver.

## 2023-02-28 NOTE — Telephone Encounter (Signed)
Pt is aware that we are still waiting on 1 more lab to come back, pt's appt is not until 10/18, we still have time to get back to her before the appt just incase she should cancel

## 2023-03-02 LAB — HEPATIC FUNCTION PANEL
ALT: 52 [IU]/L — ABNORMAL HIGH (ref 0–32)
AST: 44 [IU]/L — ABNORMAL HIGH (ref 0–40)
Albumin: 4.8 g/dL (ref 3.8–4.9)
Alkaline Phosphatase: 127 [IU]/L — ABNORMAL HIGH (ref 44–121)
Bilirubin Total: 0.4 mg/dL (ref 0.0–1.2)
Bilirubin, Direct: 0.13 mg/dL (ref 0.00–0.40)
Total Protein: 7.5 g/dL (ref 6.0–8.5)

## 2023-03-02 LAB — NASH FIBROSURE(R) PLUS
ALPHA 2-MACROGLOBULINS, QN: 413 mg/dL — ABNORMAL HIGH (ref 110–276)
ALT (SGPT) P5P: 58 [IU]/L — ABNORMAL HIGH (ref 0–40)
AST (SGOT) P5P: 52 [IU]/L — ABNORMAL HIGH (ref 0–40)
Apolipoprotein A-1: 122 mg/dL (ref 116–209)
Bilirubin, Total: 0.2 mg/dL (ref 0.0–1.2)
Cholesterol, Total: 161 mg/dL (ref 100–199)
Fibrosis Score: 0.46 — ABNORMAL HIGH (ref 0.00–0.21)
GGT: 66 [IU]/L — ABNORMAL HIGH (ref 0–60)
Glucose: 345 mg/dL — ABNORMAL HIGH (ref 70–99)
Haptoglobin: 181 mg/dL (ref 33–346)
NASH Score: 0.79 — ABNORMAL HIGH (ref 0.00–0.25)
Steatosis Score: 0.89 — ABNORMAL HIGH (ref 0.00–0.40)
Triglycerides: 294 mg/dL — ABNORMAL HIGH (ref 0–149)

## 2023-03-02 LAB — FIB-4 W/RX NASH FIBROSURE PLUS
FIB-4 Index: 1.48 (ref 0.00–2.67)
Platelets: 239 10*3/uL (ref 150–450)

## 2023-03-09 ENCOUNTER — Telehealth: Payer: Self-pay | Admitting: Gastroenterology

## 2023-03-09 NOTE — Telephone Encounter (Signed)
Patient is aware of results.

## 2023-03-09 NOTE — Telephone Encounter (Signed)
The patient is wanting to know her result from her blood work. She said stated that her blood work result was in last Thursday and no has called her with the result. She has appointment with [facility] that she can't cancel her appointment. The patient would like a call back today.

## 2023-03-16 ENCOUNTER — Telehealth: Payer: Self-pay

## 2023-03-16 NOTE — Telephone Encounter (Signed)
Pt requesting call back to reschedule procedure for 03/24/2023

## 2023-03-17 ENCOUNTER — Telehealth: Payer: Self-pay | Admitting: Gastroenterology

## 2023-03-17 NOTE — Telephone Encounter (Signed)
Patient called and left a voicemail, I called patient back no answer I left a voicemail to call back.

## 2023-03-17 NOTE — Telephone Encounter (Signed)
Pt requesting call back to reschedule procedure  dated 03/24/2023

## 2023-03-17 NOTE — Telephone Encounter (Signed)
Left message on voicemail.

## 2023-03-21 ENCOUNTER — Telehealth: Payer: Self-pay | Admitting: Gastroenterology

## 2023-03-21 NOTE — Telephone Encounter (Signed)
Patient called in to speak to [facility].

## 2023-03-22 NOTE — Telephone Encounter (Signed)
I spoke to pt and she stated that she can not schedule at this time due to her mother needing care

## 2023-03-23 ENCOUNTER — Encounter: Payer: Self-pay | Admitting: Psychiatry

## 2023-03-23 ENCOUNTER — Ambulatory Visit: Payer: 59 | Admitting: Psychiatry

## 2023-03-23 VITALS — BP 117/77 | HR 69 | Temp 97.1°F | Ht 66.0 in | Wt 244.8 lb

## 2023-03-23 DIAGNOSIS — F331 Major depressive disorder, recurrent, moderate: Secondary | ICD-10-CM

## 2023-03-23 DIAGNOSIS — G4701 Insomnia due to medical condition: Secondary | ICD-10-CM | POA: Diagnosis not present

## 2023-03-23 DIAGNOSIS — F3341 Major depressive disorder, recurrent, in partial remission: Secondary | ICD-10-CM

## 2023-03-23 DIAGNOSIS — F4310 Post-traumatic stress disorder, unspecified: Secondary | ICD-10-CM

## 2023-03-23 DIAGNOSIS — F411 Generalized anxiety disorder: Secondary | ICD-10-CM

## 2023-03-23 MED ORDER — BELSOMRA 20 MG PO TABS: 20.0000 mg | ORAL_TABLET | Freq: Every day | ORAL | 0 refills | Status: DC

## 2023-03-23 MED ORDER — REXULTI 0.5 MG PO TABS: 0.5000 mg | ORAL_TABLET | Freq: Every day | ORAL | 0 refills | Status: DC

## 2023-03-23 NOTE — Patient Instructions (Signed)
Christine Mcneil carden for therapy - [phone]

## 2023-03-23 NOTE — Progress Notes (Unsigned)
BH MD OP Progress Note  03/23/2023 10:22 AM Christine Mcneil  MRN:  [id number]  Chief Complaint:  Chief Complaint  Patient presents with   Follow-up   Depression   Anxiety   Insomnia   HPI: Christine Mcneil is a 57 year old Caucasian female, divorced, lives in [place], has a history of MDD, PTSD, insomnia, GAD, history of CVA, history of prothrombin gene mutation, chronic pain, migraine headaches, vitamin B12 deficiency, iron deficiency was evaluated in office today.  Patient today reports she is currently doing much better with regards to her mood symptoms.  She does not feel depressed or overwhelmed as she was before.  She is currently on the higher dosage of Rexulti 0.75 mg and continues to be compliant on the sertraline 100 mg.  Denies side effects.  She reports sleep is overall good.  She continues to take the Belsomra.  Patient denies any suicidality, homicidality or perceptual disturbances.  Patient reports she has been unable to establish care with a therapist however motivated to do so.  She reports her mother is currently at a rehabilitation place, and she feels it has been peaceful for her.  However reports she continues to spend at least 8 hours with her mom.  Patient reports although she is at the rehabilitation place she still wants to make sure her mother is taken care of.  Patient with abnormal liver enzymes, reports she is currently under the care of gastroenterology has upcoming appointment scheduled.  I have reviewed notes per Ms.Barringer dated 03/11/2023-patient diagnosed with severe steatosis with elevated fibrosis risk.  Patient denies any other concerns today.  Visit Diagnosis:    ICD-10-CM   1. MDD (major depressive disorder), recurrent episode, moderate (HCC)  F33.1 Brexpiprazole (REXULTI) 0.5 MG TABS    2. PTSD (post-traumatic stress disorder)  F43.10 Brexpiprazole (REXULTI) 0.5 MG TABS    3. GAD (generalized anxiety disorder)  F41.1 Brexpiprazole (REXULTI) 0.5 MG  TABS    4. Insomnia due to medical condition  G47.01 Suvorexant (BELSOMRA) 20 MG TABS   Pain, anxiety      Past Psychiatric History: I have reviewed past psychiatric history from progress note on 07/19/2017.  Past trials of medications like Effexor, Klonopin.  Past Medical History:  Past Medical History:  Diagnosis Date   Abdominal pain    Anxiety and depression    Atypical facial pain    Bilateral occipital neuralgia    Cervical spondylosis without myelopathy    Chronic daily headache    Chronic pain    Chronic pelvic pain in female    Chronic thoracic back pain    Depression    Diabetes mellitus without complication (HCC)    Diabetes mellitus, type II (HCC)    Dysphagia    Dysrhythmia    Fibromyalgia    Hyperlipidemia    Hypertension    Hypothyroidism    Insomnia    Iron deficiency anemia    Left hip pain    Low back pain    Migraines    Numbness    Optic neuropathy    OSA (obstructive sleep apnea)    Polyarthralgia    Primary osteoarthritis of both hips    Prothrombin gene mutation (HCC)    Pseudotumor cerebri    Rectal bleeding    Right shoulder pain    Rotator cuff syndrome    Stroke [facility])    Thyroid disease    TIA (transient ischemic attack) 05/27/2017    Past Surgical History:  Procedure Laterality  Date   ABDOMINAL HYSTERECTOMY     total   CHOLECYSTECTOMY     COLONOSCOPY WITH PROPOFOL N/A 03/21/2018   Procedure: COLONOSCOPY WITH PROPOFOL;  Surgeon: Midge Minium, MD;  Location: [facility] ENDOSCOPY;  Service: Endoscopy;  Laterality: N/A;   ERCP N/A 08/19/2020   Procedure: ENDOSCOPIC RETROGRADE CHOLANGIOPANCREATOGRAPHY (ERCP);  Surgeon: Midge Minium, MD;  Location: [facility] ENDOSCOPY;  Service: Endoscopy;  Laterality: N/A;   ESOPHAGOGASTRODUODENOSCOPY (EGD) WITH PROPOFOL N/A 03/21/2018   Procedure: ESOPHAGOGASTRODUODENOSCOPY (EGD) WITH PROPOFOL;  Surgeon: Midge Minium, MD;  Location: [facility] ENDOSCOPY;  Service: Endoscopy;  Laterality: N/A;   GIVENS CAPSULE STUDY  N/A 09/24/2019   Procedure: GIVENS CAPSULE STUDY;  Surgeon: Pasty Spillers, MD;  Location: [facility] ENDOSCOPY;  Service: Endoscopy;  Laterality: N/A;   KNEE SURGERY Left    LEFT HEART CATH AND CORONARY ANGIOGRAPHY N/A 03/10/2022   Procedure: LEFT HEART CATH AND CORONARY ANGIOGRAPHY;  Surgeon: Alwyn Pea, MD;  Location: [facility] INVASIVE CV LAB;  Service: Cardiovascular;  Laterality: N/A;   ROTATOR CUFF REPAIR Right    spinal neurostimulator      Family Psychiatric History: I have reviewed family psychiatric history from progress note on 07/19/2017.  Family History:  Family History  Problem Relation Age of Onset   Diabetes Mother    Hyperlipidemia Mother    Hypertension Mother    COPD Mother    CVA Mother    Anemia Mother    Diabetes Father    Hyperlipidemia Father    Hypertension Father    Thyroid disease Father    Alcohol abuse Father    Peripheral vascular disease Sister    Hypertension Sister    Anxiety disorder Son    Depression Son    Bipolar disorder Son    Seizures Son     Social History: I have reviewed social history from my progress note on 07/19/2017. Social History   Socioeconomic History   Marital status: Divorced    Spouse name: Not on file   Number of children: 1   Years of education: Not on file   Highest education level: High school graduate  Occupational History    Comment: disablitiy  Tobacco Use   Smoking status: Never   Smokeless tobacco: Never  Vaping Use   Vaping status: Never Used  Substance and Sexual Activity   Alcohol use: No   Drug use: No   Sexual activity: Not Currently  Other Topics Concern   Not on file  Social History Narrative   Not on file   Social Determinants of Health   Financial Resource Strain: Low Risk  (07/19/2017)   Overall Financial Resource Strain (CARDIA)    Difficulty of Paying Living Expenses: Not hard at all  Food Insecurity: No Food Insecurity (07/19/2017)   Hunger Vital Sign    Worried About Running  Out of Food in the Last Year: Never true    Ran Out of Food in the Last Year: Never true  Transportation Needs: No Transportation Needs (07/19/2017)   PRAPARE - Administrator, Civil Service (Medical): No    Lack of Transportation (Non-Medical): No  Physical Activity: Inactive (07/19/2017)   Exercise Vital Sign    Days of Exercise per Week: 0 days    Minutes of Exercise per Session: 0 min  Stress: Stress Concern Present (07/19/2017)   [employer] of Occupational Health - Occupational Stress Questionnaire    Feeling of Stress : Very much  Social Connections: Moderately Isolated (07/19/2017)   Social  Connection and Isolation Panel [NHANES]    Frequency of Communication with Friends and Family: Once a week    Frequency of Social Gatherings with Friends and Family: Never    Attends Religious Services: More than 4 times per year    Active Member of [employer] or Organizations: No    Attends Banker Meetings: Never    Marital Status: Divorced    Allergies:  Allergies  Allergen Reactions   Amoxapine Itching   Amoxicillin Itching   Dapagliflozin Itching    Other reaction(s): Other (See Comments) Thrush & vaginal itching   Keflex [Cephalexin] Itching   Mirtazapine Other (See Comments)    Pt states felling "like my body is outside of me."    Metabolic Disorder Labs: Lab Results  Component Value Date   HGBA1C 9.9 (H) 06/21/2017   MPG 237.43 06/21/2017   Lab Results  Component Value Date   PROLACTIN 4.3 (L) 05/26/2021   PROLACTIN 5.5 09/06/2017   Lab Results  Component Value Date   CHOL 161 02/22/2023   TRIG 294 (H) 02/22/2023   HDL 32 (L) 05/26/2021   CHOLHDL 4.9 (H) 05/26/2021   VLDL 39 06/21/2017   LDLCALC 84 05/26/2021   LDLCALC 174 (H) 06/21/2017   Lab Results  Component Value Date   TSH 2.890 09/06/2017   TSH 3.176 01/04/2017    Therapeutic Level Labs: No results found for: "LITHIUM" No results found for: "VALPROATE" No results found  for: "CBMZ"  Current Medications: Current Outpatient Medications  Medication Sig Dispense Refill   ACCU-CHEK AVIVA PLUS test strip      ACCU-CHEK SOFTCLIX LANCETS lancets      Acetaminophen (TYLENOL ARTHRITIS EXT RELIEF PO) Take 600 mg by mouth daily.     albuterol (VENTOLIN HFA) 108 (90 Base) MCG/ACT inhaler Inhale 2 puffs into the lungs every 6 (six) hours as needed for wheezing or shortness of breath. 1 g 0   atorvastatin (LIPITOR) 80 MG tablet Take 80 mg by mouth daily at 6 PM.     Blood Glucose Monitoring Suppl (ACCU-CHEK AVIVA PLUS) w/Device KIT      brexpiprazole (REXULTI) 0.25 MG TABS tablet Take 1 tablet (0.25 mg total) by mouth daily. Take along with 0.5 mg daily , total of 0.75 mg daily. 30 tablet 1   Carboxymethylcellulose Sod PF 0.5 % SOLN Apply 1 drop to eye daily as needed.     Cholecalciferol (VITAMIN D3) 1000 units CAPS Take 1 capsule by mouth daily.     clobetasol ointment (TEMOVATE) 0.05 % Apply topically.     Continuous Blood Gluc Receiver (DEXCOM G6 RECEIVER) DEVI Use to monitor blood sugar. NDC [phone]     Continuous Blood Gluc Sensor (DEXCOM G6 SENSOR) MISC Use to monitor blood sugar.  Replace every 10 days. NDC [phone].     Continuous Blood Gluc Transmit (DEXCOM G6 TRANSMITTER) MISC Use to monitor blood sugar.  Replace every 3 months. NDC [phone].     cyclobenzaprine (FLEXERIL) 5 MG tablet Take 1 tablet by mouth 3 (three) times daily as needed.     dicyclomine (BENTYL) 20 MG tablet Take 1 tablet (20 mg total) by mouth 3 (three) times daily before meals. 90 tablet 5   DILT-XR 240 MG 24 hr capsule Take 240 mg by mouth daily.     esomeprazole (NEXIUM) 40 MG capsule Take 40 mg by mouth daily.     ezetimibe (ZETIA) 10 MG tablet Take 10 mg by mouth at bedtime.     famotidine (  PEPCID) 40 MG tablet Take 40 mg by mouth at bedtime.     ferrous sulfate 325 (65 FE) MG tablet Take by mouth. Take 325 mg daily with breakfast     fluticasone (FLONASE) 50 MCG/ACT  nasal spray Place 2 sprays into both nostrils as needed.      furosemide (LASIX) 40 MG tablet TAKE (1) TABLET BY MOUTH EVERY DAY     gabapentin (NEURONTIN) 600 MG tablet TAKE 1 TABLET (600 MG TOTAL) BY MOUTH DAILY. (DOSE CHANGE) 90 tablet 0   HUMALOG KWIKPEN 200 UNIT/ML KwikPen SMARTSIG:100 Unit(s) SUB-Q Daily     hydrOXYzine (VISTARIL) 25 MG capsule Take 1-2 capsules (25-50 mg total) by mouth 2 (two) times daily as needed for anxiety (and sleep). 360 capsule 0   Insulin Disposable Pump (OMNIPOD 5 G6 INTRO, GEN 5,) KIT SMARTSIG:1 SUB-Q Every 3 Days     Insulin Pen Needle (TRUEPLUS PEN NEEDLES) 29G X MISC      levothyroxine (SYNTHROID) 112 MCG tablet Take 112 mcg by mouth daily.     lidocaine 4 % 1 patch daily.     magnesium oxide (MAG-OX) 400 MG tablet Take 1 tablet by mouth daily.     Melatonin 10 MG TABS Take 2 tablets by mouth at bedtime.     meloxicam (MOBIC) 15 MG tablet Take 15 mg by mouth daily.     metFORMIN (GLUCOPHAGE-XR) 500 MG 24 hr tablet Take 500 mg by mouth 2 (two) times daily.      metoprolol succinate (TOPROL-XL) 25 MG 24 hr tablet Take 1 tablet by mouth daily.     mometasone (ELOCON) 0.1 % cream Apply topically.     naloxone (NARCAN) nasal spray 4 mg/0.1 mL Place into the nose.     nystatin (MYCOSTATIN) 100000 UNIT/ML suspension Take 5 mLs by mouth 4 (four) times daily.     omeprazole (PRILOSEC) 20 MG capsule TAKE (1) CAPSULE BY MOUTH EVERY DAY 30 capsule 0   ondansetron (ZOFRAN) 4 MG tablet Take 4 mg by mouth every 8 (eight) hours as needed.     RYBELSUS 7 MG TABS Take 1 tablet by mouth daily.     sertraline (ZOLOFT) 100 MG tablet Take 1 tablet (100 mg total) by mouth daily with breakfast. 90 tablet 0   vitamin B-12 (CYANOCOBALAMIN) 100 MCG tablet Take 1 tablet (100 mcg total) by mouth daily. 90 tablet 1   XTAMPZA ER 9 MG C12A PLEASE SEE ATTACHED FOR DETAILED DIRECTIONS     zonisamide (ZONEGRAN) 100 MG capsule Take 100 mg by mouth daily.     Brexpiprazole (REXULTI) 0.5  MG TABS Take 1 tablet (0.5 mg total) by mouth daily. Take along with 0.25 mg , total of 0.75 mg daily 90 tablet 0   dabigatran (PRADAXA) 150 MG CAPS capsule Take 150 mg by mouth 2 (two) times daily.     nitroGLYCERIN (NITROSTAT) 0.4 MG SL tablet Place under the tongue.     pioglitazone (ACTOS) 15 MG tablet Take by mouth. Take 1 tablet by mouth once daily     Suvorexant (BELSOMRA) 20 MG TABS Take 1 tablet (20 mg total) by mouth at bedtime. 90 tablet 0   No current facility-administered medications for this visit.   Facility-Administered Medications Ordered in Other Visits  Medication Dose Route Frequency Provider Last Rate Last Admin   iohexol (OMNIPAQUE) 300 MG/ML solution    PRN Callwood, Dwayne D, MD   60 mL at 03/10/22 1442     Musculoskeletal: Strength &  Muscle Tone: within normal limits Gait & Station: normal Patient leans: N/A  Psychiatric Specialty Exam: Review of Systems  Psychiatric/Behavioral:  The patient is nervous/anxious.     Blood pressure 117/77, pulse 69, temperature (!) 97.1 F (36.2 C), temperature source Skin, height 5\' 6"  (1.676 m), weight 244 lb 12.8 oz (111 kg).Body mass index is 39.51 kg/m.  General Appearance: Fairly Groomed  Eye Contact:  Fair  Speech:  Clear and Coherent  Volume:  Normal  Mood:  Anxious improving  Affect:  Congruent  Thought Process:  Goal Directed and Descriptions of Associations: Intact  Orientation:  Full (Time, Place, and Person)  Thought Content: Logical   Suicidal Thoughts:  No  Homicidal Thoughts:  No  Memory:  Immediate;   Fair Recent;   Fair Remote;   Fair  Judgement:  Fair  Insight:  Fair  Psychomotor Activity:  Normal  Concentration:  Concentration: Fair and Attention Span: Fair  Recall:  [employer] of Knowledge: Fair  Language: Fair  Akathisia:  No  Handed:  Right  AIMS (if indicated): done  Assets:  Communication Skills Desire for Improvement Housing Social Support Transportation  ADL's:  Intact   Cognition: WNL  Sleep:  Fair   Screenings: Midwife Visit from 02/21/2023 in [facility] Office Visit from 01/26/2023 in [facility] Office Visit from 01/07/2023 in [facility] Office Visit from 09/15/2022 in [facility] Video Visit from 02/04/2022 in [facility]  AIMS Total Score 0 0 0 0 0      GAD-7    Flowsheet Row Office Visit from 03/23/2023 in [facility] Office Visit from 02/21/2023 in [facility] Office Visit from 01/26/2023 in [facility] Office Visit from 01/07/2023 in [facility] Office Visit from 09/15/2022 in [facility]  Total GAD-7 Score 11 19 18 13 12       PHQ2-9    Flowsheet Row Office Visit from 03/23/2023 in [facility] Office Visit from 02/21/2023 in [facility] Office Visit from 01/26/2023 in [facility] Office Visit from 01/07/2023 in [facility] Office Visit from 09/15/2022 in [facility]  PHQ-2 Total Score 5 6 6 4 3   PHQ-9 Total Score 12 15 14 11 5       Flowsheet Row Office Visit from 03/23/2023 in [facility] Office Visit from 02/21/2023 in [facility] Office Visit from 01/26/2023 in [facility]  C-SSRS RISK CATEGORY Moderate Risk Low Risk No Risk        Assessment and Plan: ALIXANDRIA LUDLAM is a 57 year old Caucasian female who has a  history of MDD, PTSD, multiple medical problems including recent diagnosis of hepatic steatosis was evaluated in office today.  Patient with improvement with regards to her mood symptoms, will continue to benefit from medication management, establishing care with a therapist, plan as noted below.  Plan MDD in partial remission Rexulti 0.75 mg p.o. daily Sertraline 100 mg p.o. daily Patient advised to establish care with therapist-provided information for Ms. Phyllis Ginger again.  GAD-improving Sertraline 100 mg p.o. daily Hydroxyzine 25-50 mg p.o. twice daily  as needed Patient to establish care with therapist.  PTSD-improving Sertraline 100 mg p.o. daily Patient will benefit from psychotherapy referred for the same.  Insomnia-improving Continue Belsomra 20 mg p.o. daily at bedtime Continue sleep hygiene techniques She will need sufficient pain management   Collaboration of Care: Collaboration of Care: Other I have not notes per Ms.Barringer, gastroenterology as noted above  Patient/Guardian was advised Release of Information must be obtained prior to any record release in order to collaborate their care with an outside provider. Patient/Guardian was advised if they have not already done so to contact the registration department to sign all necessary forms in order for [name] to release information regarding their care.   Consent: Patient/Guardian gives verbal consent for treatment and assignment of benefits for services provided during this visit. Patient/Guardian expressed understanding and agreed to proceed.   Follow-up in clinic in 3 to 4 weeks or sooner if needed.  This note was generated in part or whole with voice recognition software. Voice recognition is usually quite accurate but there are transcription errors that can and very often do occur. I apologize for any typographical errors that were not detected and corrected.    Jomarie Longs, MD 03/23/2023, 10:22 AM

## 2023-03-24 ENCOUNTER — Ambulatory Visit: Admission: RE | Admit: 2023-03-24 | Payer: 59 | Source: Home / Self Care | Admitting: Gastroenterology

## 2023-03-24 SURGERY — ESOPHAGOGASTRODUODENOSCOPY (EGD) WITH PROPOFOL
Anesthesia: General

## 2023-03-28 NOTE — Telephone Encounter (Signed)
error 

## 2023-03-29 ENCOUNTER — Telehealth: Payer: Self-pay | Admitting: Gastroenterology

## 2023-03-29 NOTE — Telephone Encounter (Signed)
Pt reports she is still having RUQ abd pain cramping/achy--"deep pain" with radiation to upper back and pain increases when she lays on her right side and has taken Tylenol with no relief also taking Xtampza BID... Also with nausea with relief after taking Zofran.Marland KitchenMarland Kitchen

## 2023-03-29 NOTE — Telephone Encounter (Signed)
Pt having severe pain on her left side would like a call back to discuss her next option

## 2023-03-30 NOTE — Telephone Encounter (Signed)
Pt is aware as instructed and expressed understanding... Pt stated that she will get her mom home and settled the end of the week and call back to schedule once she has an idea of when she could do the procedure... ED precautions given

## 2023-04-14 ENCOUNTER — Encounter: Payer: Self-pay | Admitting: Psychiatry

## 2023-04-14 ENCOUNTER — Telehealth: Payer: 59 | Admitting: Psychiatry

## 2023-04-14 DIAGNOSIS — F3341 Major depressive disorder, recurrent, in partial remission: Secondary | ICD-10-CM

## 2023-04-14 NOTE — Progress Notes (Signed)
Patient attempted to clinic for the appointment however reported she was in her car and hence did not have good connection hence video did not work.  Patient agrees to contact the office to schedule another appointment.

## 2023-04-28 ENCOUNTER — Other Ambulatory Visit: Payer: Self-pay | Admitting: Psychiatry

## 2023-04-28 DIAGNOSIS — F331 Major depressive disorder, recurrent, moderate: Secondary | ICD-10-CM

## 2023-04-29 ENCOUNTER — Telehealth: Payer: Self-pay | Admitting: Psychiatry

## 2023-04-29 DIAGNOSIS — F411 Generalized anxiety disorder: Secondary | ICD-10-CM

## 2023-04-29 DIAGNOSIS — F4310 Post-traumatic stress disorder, unspecified: Secondary | ICD-10-CM

## 2023-04-29 DIAGNOSIS — F331 Major depressive disorder, recurrent, moderate: Secondary | ICD-10-CM

## 2023-04-29 DIAGNOSIS — F3341 Major depressive disorder, recurrent, in partial remission: Secondary | ICD-10-CM

## 2023-04-29 MED ORDER — BREXPIPRAZOLE 0.25 MG PO TABS: 0.2500 mg | ORAL_TABLET | Freq: Every day | ORAL | 1 refills | Status: DC

## 2023-04-29 MED ORDER — REXULTI 0.5 MG PO TABS: 0.5000 mg | ORAL_TABLET | Freq: Every day | ORAL | 1 refills | Status: DC

## 2023-04-29 NOTE — Telephone Encounter (Signed)
I have sent Rexulti 0.75 mg to pharmacy at [facility].

## 2023-05-03 ENCOUNTER — Ambulatory Visit: Payer: 59 | Admitting: Psychiatry

## 2023-05-09 ENCOUNTER — Encounter: Payer: Self-pay | Admitting: Psychiatry

## 2023-05-09 ENCOUNTER — Ambulatory Visit (INDEPENDENT_AMBULATORY_CARE_PROVIDER_SITE_OTHER): Payer: 59 | Admitting: Psychiatry

## 2023-05-09 VITALS — BP 138/82 | HR 93 | Temp 98.8°F | Ht 66.5 in | Wt 246.0 lb

## 2023-05-09 DIAGNOSIS — F4310 Post-traumatic stress disorder, unspecified: Secondary | ICD-10-CM

## 2023-05-09 DIAGNOSIS — G4701 Insomnia due to medical condition: Secondary | ICD-10-CM | POA: Diagnosis not present

## 2023-05-09 DIAGNOSIS — F411 Generalized anxiety disorder: Secondary | ICD-10-CM | POA: Diagnosis not present

## 2023-05-09 DIAGNOSIS — F3341 Major depressive disorder, recurrent, in partial remission: Secondary | ICD-10-CM

## 2023-05-09 MED ORDER — SERTRALINE HCL 100 MG PO TABS: 150.0000 mg | ORAL_TABLET | Freq: Every day | ORAL | 0 refills | Status: DC

## 2023-05-09 MED ORDER — GABAPENTIN 600 MG PO TABS: 1200.0000 mg | ORAL_TABLET | Freq: Every day | ORAL | Status: DC

## 2023-05-09 NOTE — Progress Notes (Unsigned)
BH MD OP Progress Note  05/09/2023 5:19 PM Christine Mcneil  MRN:  [id number]  Chief Complaint:  Chief Complaint  Patient presents with   Follow-up   Anxiety   Depression   Medication Refill   HPI: Christine Mcneil is a 57 year old Caucasian female, divorced, lives in [place], has a history of MDD, PTSD, insomnia, GAD, history of CVA, history of prothrombin gene mutation, chronic pain, migraine headaches, B12 deficiency, iron deficiency was evaluated in office today.  Christine Mcneil, a patient with a history of major depression, post-traumatic stress disorder, generalized anxiety disorder, and insomnia, presents with ongoing situational stress related to her mother's declining health. Her mother, who has been in and out of the hospital for the past six months, recently fell and was admitted to a rehabilitation facility. However, due to her mother's inability to stand and worsening memory, the decision was made to transition her to palliative care at home with the assistance of hospice.  Christine Mcneil reports feeling overwhelmed but accepts the situation, acknowledging that her mother is tired and her quality of life has significantly deteriorated. She also mentions that her mother's frequent hospitalizations have negatively impacted her memory. Christine Mcneil and her sister have made their mother a DNR, a decision that initially upset their mother but was eventually accepted.  In terms of her own health, Christine Mcneil reports no changes to her medication regimen, which includes Rexulti, Zoloft, hydroxyzine as needed, and Belsomra. She denies any new medication changes.  She denies any suicidal thoughts, homicidal thoughts or perceptual disturbances.  She denies abnormal involuntary movements.   She does, however, report ongoing issues with lymphedema of the arm, for which she is scheduled to see a specialist.  Christine Mcneil agrees to an increase in her Zoloft dosage from 100mg  to 150mg . She is also motivated to establish care with a  therapist.  Visit Diagnosis:    ICD-10-CM   1. Recurrent major depressive disorder, in partial remission (HCC)  F33.41     2. PTSD (post-traumatic stress disorder)  F43.10 sertraline (ZOLOFT) 100 MG tablet    gabapentin (NEURONTIN) 600 MG tablet    3. GAD (generalized anxiety disorder)  F41.1 sertraline (ZOLOFT) 100 MG tablet    gabapentin (NEURONTIN) 600 MG tablet    4. Insomnia due to medical condition  G47.01    Pain and anxiety      Past Psychiatric History: I have reviewed past psychiatric history from progress note on 07/19/2017.  Past trials of medications like Effexor, Klonopin.  Past Medical History:  Past Medical History:  Diagnosis Date   Abdominal pain    Anxiety and depression    Atypical facial pain    Bilateral occipital neuralgia    Cervical spondylosis without myelopathy    Chronic daily headache    Chronic pain    Chronic pelvic pain in female    Chronic thoracic back pain    Depression    Diabetes mellitus without complication (HCC)    Diabetes mellitus, type II (HCC)    Dysphagia    Dysrhythmia    Fibromyalgia    Hyperlipidemia    Hypertension    Hypothyroidism    Insomnia    Iron deficiency anemia    Left hip pain    Low back pain    Migraines    Numbness    Optic neuropathy    OSA (obstructive sleep apnea)    Polyarthralgia    Primary osteoarthritis of both hips    Prothrombin gene mutation (HCC)  Pseudotumor cerebri    Rectal bleeding    Right shoulder pain    Rotator cuff syndrome    Stroke [facility])    Thyroid disease    TIA (transient ischemic attack) 05/27/2017    Past Surgical History:  Procedure Laterality Date   ABDOMINAL HYSTERECTOMY     total   CHOLECYSTECTOMY     COLONOSCOPY WITH PROPOFOL N/A 03/21/2018   Procedure: COLONOSCOPY WITH PROPOFOL;  Surgeon: Midge Minium, MD;  Location: [facility] ENDOSCOPY;  Service: Endoscopy;  Laterality: N/A;   ERCP N/A 08/19/2020   Procedure: ENDOSCOPIC RETROGRADE CHOLANGIOPANCREATOGRAPHY  (ERCP);  Surgeon: Midge Minium, MD;  Location: [facility] ENDOSCOPY;  Service: Endoscopy;  Laterality: N/A;   ESOPHAGOGASTRODUODENOSCOPY (EGD) WITH PROPOFOL N/A 03/21/2018   Procedure: ESOPHAGOGASTRODUODENOSCOPY (EGD) WITH PROPOFOL;  Surgeon: Midge Minium, MD;  Location: [facility] ENDOSCOPY;  Service: Endoscopy;  Laterality: N/A;   GIVENS CAPSULE STUDY N/A 09/24/2019   Procedure: GIVENS CAPSULE STUDY;  Surgeon: Pasty Spillers, MD;  Location: [facility] ENDOSCOPY;  Service: Endoscopy;  Laterality: N/A;   KNEE SURGERY Left    LEFT HEART CATH AND CORONARY ANGIOGRAPHY N/A 03/10/2022   Procedure: LEFT HEART CATH AND CORONARY ANGIOGRAPHY;  Surgeon: Alwyn Pea, MD;  Location: [facility] INVASIVE CV LAB;  Service: Cardiovascular;  Laterality: N/A;   ROTATOR CUFF REPAIR Right    spinal neurostimulator      Family Psychiatric History: I have reviewed family psychiatric history from progress note on 07/19/2017.  Family History:  Family History  Problem Relation Age of Onset   Diabetes Mother    Hyperlipidemia Mother    Hypertension Mother    COPD Mother    CVA Mother    Anemia Mother    Diabetes Father    Hyperlipidemia Father    Hypertension Father    Thyroid disease Father    Alcohol abuse Father    Peripheral vascular disease Sister    Hypertension Sister    Anxiety disorder Son    Depression Son    Bipolar disorder Son    Seizures Son     Social History: I have reviewed social history from progress note on 07/19/2017. Social History   Socioeconomic History   Marital status: Divorced    Spouse name: Not on file   Number of children: 1   Years of education: Not on file   Highest education level: High school graduate  Occupational History    Comment: disablitiy  Tobacco Use   Smoking status: Never   Smokeless tobacco: Never  Vaping Use   Vaping status: Never Used  Substance and Sexual Activity   Alcohol use: No   Drug use: No   Sexual activity: Not Currently  Other Topics Concern    Not on file  Social History Narrative   Not on file   Social Drivers of Health   Financial Resource Strain: Low Risk  (07/19/2017)   Overall Financial Resource Strain (CARDIA)    Difficulty of Paying Living Expenses: Not hard at all  Food Insecurity: No Food Insecurity (07/19/2017)   Hunger Vital Sign    Worried About Running Out of Food in the Last Year: Never true    Ran Out of Food in the Last Year: Never true  Transportation Needs: No Transportation Needs (07/19/2017)   PRAPARE - Administrator, Civil Service (Medical): No    Lack of Transportation (Non-Medical): No  Physical Activity: Inactive (07/19/2017)   Exercise Vital Sign    Days of Exercise per Week: 0 days  Minutes of Exercise per Session: 0 min  Stress: Stress Concern Present (07/19/2017)   [employer] of Occupational Health - Occupational Stress Questionnaire    Feeling of Stress : Very much  Social Connections: Moderately Isolated (07/19/2017)   Social Connection and Isolation Panel [NHANES]    Frequency of Communication with Friends and Family: Once a week    Frequency of Social Gatherings with Friends and Family: Never    Attends Religious Services: More than 4 times per year    Active Member of [employer] or Organizations: No    Attends Banker Meetings: Never    Marital Status: Divorced    Allergies:  Allergies  Allergen Reactions   Amoxapine Itching   Amoxicillin Itching   Dapagliflozin Itching    Other reaction(s): Other (See Comments) Thrush & vaginal itching   Keflex [Cephalexin] Itching   Mirtazapine Other (See Comments)    Pt states felling "like my body is outside of me."    Metabolic Disorder Labs: Lab Results  Component Value Date   HGBA1C 9.9 (H) 06/21/2017   MPG 237.43 06/21/2017   Lab Results  Component Value Date   PROLACTIN 4.3 (L) 05/26/2021   PROLACTIN 5.5 09/06/2017   Lab Results  Component Value Date   CHOL 161 02/22/2023   TRIG 294 (H)  02/22/2023   HDL 32 (L) 05/26/2021   CHOLHDL 4.9 (H) 05/26/2021   VLDL 39 06/21/2017   LDLCALC 84 05/26/2021   LDLCALC 174 (H) 06/21/2017   Lab Results  Component Value Date   TSH 2.890 09/06/2017   TSH 3.176 01/04/2017    Therapeutic Level Labs: No results found for: "LITHIUM" No results found for: "VALPROATE" No results found for: "CBMZ"  Current Medications: Current Outpatient Medications  Medication Sig Dispense Refill   ACCU-CHEK AVIVA PLUS test strip      ACCU-CHEK SOFTCLIX LANCETS lancets      Acetaminophen (TYLENOL ARTHRITIS EXT RELIEF PO) Take 600 mg by mouth daily.     albuterol (VENTOLIN HFA) 108 (90 Base) MCG/ACT inhaler Inhale 2 puffs into the lungs every 6 (six) hours as needed for wheezing or shortness of breath. 1 g 0   atorvastatin (LIPITOR) 80 MG tablet Take 80 mg by mouth daily at 6 PM.     Blood Glucose Monitoring Suppl (ACCU-CHEK AVIVA PLUS) w/Device KIT      brexpiprazole (REXULTI) 0.25 MG TABS tablet Take 1 tablet (0.25 mg total) by mouth daily. Take along with 0.5 mg daily , total of 0.75 mg daily. 90 tablet 1   Brexpiprazole (REXULTI) 0.5 MG TABS Take 1 tablet (0.5 mg total) by mouth daily. Take along with 0.25 mg , total of 0.75 mg daily 90 tablet 1   Carboxymethylcellulose Sod PF 0.5 % SOLN Apply 1 drop to eye daily as needed.     Cholecalciferol (VITAMIN D3) 1000 units CAPS Take 1 capsule by mouth daily.     clobetasol ointment (TEMOVATE) 0.05 % Apply topically.     Continuous Blood Gluc Receiver (DEXCOM G6 RECEIVER) DEVI Use to monitor blood sugar. NDC [phone]     Continuous Blood Gluc Sensor (DEXCOM G6 SENSOR) MISC Use to monitor blood sugar.  Replace every 10 days. NDC [phone].     Continuous Blood Gluc Transmit (DEXCOM G6 TRANSMITTER) MISC Use to monitor blood sugar.  Replace every 3 months. NDC [phone].     cyclobenzaprine (FLEXERIL) 5 MG tablet Take 1 tablet by mouth 3 (three) times daily as needed.  dicyclomine (BENTYL) 20 MG  tablet Take 1 tablet (20 mg total) by mouth 3 (three) times daily before meals. 90 tablet 5   DILT-XR 240 MG 24 hr capsule Take 240 mg by mouth daily.     esomeprazole (NEXIUM) 40 MG capsule Take 40 mg by mouth daily.     ezetimibe (ZETIA) 10 MG tablet Take 10 mg by mouth at bedtime.     famotidine (PEPCID) 40 MG tablet Take 40 mg by mouth at bedtime.     ferrous sulfate 325 (65 FE) MG tablet Take by mouth. Take 325 mg daily with breakfast     fluticasone (FLONASE) 50 MCG/ACT nasal spray Place 2 sprays into both nostrils as needed.      furosemide (LASIX) 40 MG tablet TAKE (1) TABLET BY MOUTH EVERY DAY     HUMALOG KWIKPEN 200 UNIT/ML KwikPen SMARTSIG:100 Unit(s) SUB-Q Daily     hydrOXYzine (VISTARIL) 25 MG capsule Take 1-2 capsules (25-50 mg total) by mouth 2 (two) times daily as needed for anxiety (and sleep). 360 capsule 0   Insulin Disposable Pump (OMNIPOD 5 G6 INTRO, GEN 5,) KIT SMARTSIG:1 SUB-Q Every 3 Days     Insulin Pen Needle (TRUEPLUS PEN NEEDLES) 29G X MISC      levothyroxine (SYNTHROID) 112 MCG tablet Take 112 mcg by mouth daily.     lidocaine 4 % 1 patch daily.     magnesium oxide (MAG-OX) 400 MG tablet Take 1 tablet by mouth daily.     Melatonin 10 MG TABS Take 2 tablets by mouth at bedtime.     meloxicam (MOBIC) 15 MG tablet Take 15 mg by mouth daily.     metFORMIN (GLUCOPHAGE-XR) 500 MG 24 hr tablet Take 500 mg by mouth 2 (two) times daily.      metoprolol succinate (TOPROL-XL) 25 MG 24 hr tablet Take 1 tablet by mouth daily.     mometasone (ELOCON) 0.1 % cream Apply topically.     naloxone (NARCAN) nasal spray 4 mg/0.1 mL Place into the nose.     nystatin (MYCOSTATIN) 100000 UNIT/ML suspension Take 5 mLs by mouth 4 (four) times daily.     omeprazole (PRILOSEC) 20 MG capsule TAKE (1) CAPSULE BY MOUTH EVERY DAY 30 capsule 0   ondansetron (ZOFRAN) 4 MG tablet Take 4 mg by mouth every 8 (eight) hours as needed.     RYBELSUS 7 MG TABS Take 1 tablet by mouth daily.      Suvorexant (BELSOMRA) 20 MG TABS Take 1 tablet (20 mg total) by mouth at bedtime. 90 tablet 0   vitamin B-12 (CYANOCOBALAMIN) 100 MCG tablet Take 1 tablet (100 mcg total) by mouth daily. 90 tablet 1   XTAMPZA ER 9 MG C12A PLEASE SEE ATTACHED FOR DETAILED DIRECTIONS     zonisamide (ZONEGRAN) 100 MG capsule Take 100 mg by mouth daily.     dabigatran (PRADAXA) 150 MG CAPS capsule Take 150 mg by mouth 2 (two) times daily.     gabapentin (NEURONTIN) 600 MG tablet Take 2 tablets (1,200 mg total) by mouth at bedtime. Prescribed by pain doc     nitroGLYCERIN (NITROSTAT) 0.4 MG SL tablet Place under the tongue.     pioglitazone (ACTOS) 15 MG tablet Take by mouth. Take 1 tablet by mouth once daily     sertraline (ZOLOFT) 100 MG tablet Take 1.5 tablets (150 mg total) by mouth daily with breakfast. 135 tablet 0   No current facility-administered medications for this visit.   Facility-Administered  Medications Ordered in Other Visits  Medication Dose Route Frequency Provider Last Rate Last Admin   iohexol (OMNIPAQUE) 300 MG/ML solution    PRN Callwood, Dwayne D, MD   60 mL at 03/10/22 1442     Musculoskeletal: Strength & Muscle Tone: within normal limits Gait & Station: normal Patient leans: N/A  Psychiatric Specialty Exam: Review of Systems  Psychiatric/Behavioral:  Positive for dysphoric mood. The patient is nervous/anxious.     Blood pressure 138/82, pulse 93, temperature 98.8 F (37.1 C), height 5' 6.5" (1.689 m), weight 246 lb (111.6 kg), SpO2 96%.Body mass index is 39.11 kg/m.  General Appearance: Fairly Groomed  Eye Contact:  Fair  Speech:  Normal Rate  Volume:  Normal  Mood:  Anxious and Depressed  Affect:  Tearful  Thought Process:  Goal Directed and Descriptions of Associations: Intact  Orientation:  Full (Time, Place, and Person)  Thought Content: Logical   Suicidal Thoughts:  No  Homicidal Thoughts:  No  Memory:  Immediate;   Fair Recent;   Fair Remote;   Fair  Judgement:   Fair  Insight:  Fair  Psychomotor Activity:  Normal  Concentration:  Concentration: Fair and Attention Span: Fair  Recall:  [employer] of Knowledge: Fair  Language: Fair  Akathisia:  No  Handed:  Right  AIMS (if indicated):  done  Assets:  Communication Skills Desire for Improvement Housing Social Support  ADL's:  Intact  Cognition: WNL  Sleep:  Fair   Screenings: Midwife Visit from 05/09/2023 in [facility] Office Visit from 03/23/2023 in [facility] Office Visit from 02/21/2023 in [facility] Office Visit from 01/26/2023 in [facility] Office Visit from 01/07/2023 in [facility]  AIMS Total Score 0 0 0 0 0      GAD-7    Flowsheet Row Office Visit from 05/09/2023 in [facility] Office Visit from 03/23/2023 in [facility] Office Visit from 02/21/2023 in [facility] Office Visit from 01/26/2023 in [facility] Office Visit from 01/07/2023 in [facility]  Total GAD-7 Score 11 11 19 18 13       PHQ2-9    Flowsheet Row Office Visit from 05/09/2023 in [facility] Office Visit from 03/23/2023 in [facility] Office Visit from 02/21/2023 in [facility] Office Visit from 01/26/2023 in [facility] Office Visit from 01/07/2023 in [facility]  PHQ-2 Total Score 3 5 6 6 4   PHQ-9 Total Score 11 12 15 14 11       Flowsheet Row Office Visit from 05/09/2023 in [facility] Office Visit from 03/23/2023 in [facility] Office Visit from 02/21/2023 in [facility]  C-SSRS RISK CATEGORY Moderate Risk Moderate Risk Low Risk        Assessment and Plan: JACLEEN LISCANO is a 57 year old Caucasian female who has a history of MDD, PTSD, multiple medical problems including hepatic steatosis, was evaluated in office today.  Patient with worsening depression and anxiety symptoms due to situational stressors, discussed assessment and plan as noted below.    Major Depression-unstable Chronic major depression  with situational exacerbation due to mother's declining health and transition to hospice care. Current medications include Rexulti, Zoloft, and hydroxyzine. Depression screen remains high. Discussed increasing Zoloft to 150 mg to better manage symptoms. Informed about potential side effects and the need for 6-8 weeks for the medication to take full effect.   - Increase Zoloft to 150 mg daily   - [facility]   - Continue Rexulti 0.75 mg p.o. daily - Establish care with a therapist at the practice   - Follow-up in one month    Generalized Anxiety Disorder-unstable Chronic generalized anxiety disorder, managed with current medications. No new symptoms reported.   - Continue current medications   - Increase Zoloft to 150 mg daily. - Hydroxyzine 25-50 mg twice a day as needed.  Post-Traumatic Stress Disorder (PTSD)-improving Chronic PTSD, no new symptoms reported. Current medications include Rexulti and hydroxyzine as needed.   - Continue current medications   - Patient to establish care with therapist.  Insomnia-improving Chronic insomnia, currently managed with Belsomra 20 mg. Patient reports sleeping well at night.   - Continue Belsomra 20 mg   - Reviewed [place]MP AWARxE   Diabetes Mellitus Type 2   Poorly controlled diabetes with a  hemoglobin A1c of 9.1. Patient is working on dietary changes. Current medications include metformin, Rybelsus, and Actos. Discussed the impact of Rexulti on blood sugar levels and the possibility of discontinuing it in the future if blood sugar levels do not improve.   - Continue current diabetes medications   - Monitor blood sugar levels   - Reassess in three months    General Health Maintenance   No new medications reported. No suicidal thoughts or abnormal involuntary movements. No changes in medical history.   - Continue current medications    Goals of Care   Patient's mother is transitioning to hospice care. Discussed the importance of quality of life and the emotional impact on the patient. Patient and her sister have decided on a DNR status for their mother, prioritizing comfort and quality of life over life-sustaining measures.   - Encourage social interactions and self-care   - Provide emotional support and counseling    Follow-up   - Schedule follow-up appointment in one month   - Schedule appointment with therapist.   Collaboration of Care: Collaboration of Care: Referral or follow-up with counselor/therapist AEB communicated with staff to schedule this patient with therapist.  Patient/Guardian was advised Release of Information must be obtained prior to any record release in order to collaborate their care with an outside provider. Patient/Guardian was advised if they have not already done so to contact the registration department to sign all necessary forms in order for [name] to release information regarding their care.   Consent: Patient/Guardian gives verbal consent for treatment and assignment of benefits for services provided during this visit. Patient/Guardian expressed understanding and agreed to proceed.   This note was generated in part or whole with voice recognition software. Voice recognition is usually quite accurate but there are transcription errors that can and very  often do occur. I apologize for any typographical errors that were not detected and corrected.    Jomarie Longs, MD 05/11/2023, 4:33 PM

## 2023-05-10 ENCOUNTER — Encounter: Payer: Self-pay | Admitting: Oncology

## 2023-05-17 ENCOUNTER — Encounter (INDEPENDENT_AMBULATORY_CARE_PROVIDER_SITE_OTHER): Payer: 59 | Admitting: Nurse Practitioner

## 2023-05-19 ENCOUNTER — Other Ambulatory Visit: Payer: Self-pay | Admitting: Family Medicine

## 2023-05-19 DIAGNOSIS — Z1231 Encounter for screening mammogram for malignant neoplasm of breast: Secondary | ICD-10-CM

## 2023-06-01 ENCOUNTER — Other Ambulatory Visit: Payer: 59

## 2023-06-01 ENCOUNTER — Ambulatory Visit: Payer: 59 | Admitting: Oncology

## 2023-06-07 ENCOUNTER — Other Ambulatory Visit: Payer: Self-pay

## 2023-06-07 ENCOUNTER — Encounter (INDEPENDENT_AMBULATORY_CARE_PROVIDER_SITE_OTHER): Payer: Self-pay | Admitting: Nurse Practitioner

## 2023-06-07 ENCOUNTER — Ambulatory Visit (INDEPENDENT_AMBULATORY_CARE_PROVIDER_SITE_OTHER): Payer: 59 | Admitting: Nurse Practitioner

## 2023-06-07 VITALS — BP 121/79 | HR 75 | Resp 16 | Ht 66.0 in | Wt 240.2 lb

## 2023-06-07 DIAGNOSIS — D509 Iron deficiency anemia, unspecified: Secondary | ICD-10-CM

## 2023-06-07 DIAGNOSIS — I890 Lymphedema, not elsewhere classified: Secondary | ICD-10-CM | POA: Diagnosis not present

## 2023-06-07 DIAGNOSIS — I10 Essential (primary) hypertension: Secondary | ICD-10-CM | POA: Diagnosis not present

## 2023-06-07 DIAGNOSIS — E538 Deficiency of other specified B group vitamins: Secondary | ICD-10-CM

## 2023-06-07 DIAGNOSIS — M25511 Pain in right shoulder: Secondary | ICD-10-CM

## 2023-06-08 ENCOUNTER — Encounter (INDEPENDENT_AMBULATORY_CARE_PROVIDER_SITE_OTHER): Payer: Self-pay | Admitting: Nurse Practitioner

## 2023-06-08 ENCOUNTER — Inpatient Hospital Stay: Payer: 59 | Attending: Oncology

## 2023-06-08 ENCOUNTER — Encounter: Payer: Self-pay | Admitting: Oncology

## 2023-06-08 ENCOUNTER — Inpatient Hospital Stay (HOSPITAL_BASED_OUTPATIENT_CLINIC_OR_DEPARTMENT_OTHER): Payer: 59 | Admitting: Oncology

## 2023-06-08 VITALS — BP 128/79 | HR 65 | Temp 98.8°F | Resp 19 | Ht 66.0 in

## 2023-06-08 DIAGNOSIS — Z860100 Personal history of colon polyps, unspecified: Secondary | ICD-10-CM | POA: Diagnosis not present

## 2023-06-08 DIAGNOSIS — D508 Other iron deficiency anemias: Secondary | ICD-10-CM | POA: Diagnosis not present

## 2023-06-08 DIAGNOSIS — E538 Deficiency of other specified B group vitamins: Secondary | ICD-10-CM | POA: Diagnosis not present

## 2023-06-08 DIAGNOSIS — D509 Iron deficiency anemia, unspecified: Secondary | ICD-10-CM | POA: Diagnosis present

## 2023-06-08 DIAGNOSIS — Z86718 Personal history of other venous thrombosis and embolism: Secondary | ICD-10-CM | POA: Insufficient documentation

## 2023-06-08 DIAGNOSIS — Z7901 Long term (current) use of anticoagulants: Secondary | ICD-10-CM | POA: Insufficient documentation

## 2023-06-08 LAB — CBC (CANCER CENTER ONLY)
HCT: 39.3 % (ref 36.0–46.0)
Hemoglobin: 12.8 g/dL (ref 12.0–15.0)
MCH: 28.8 pg (ref 26.0–34.0)
MCHC: 32.6 g/dL (ref 30.0–36.0)
MCV: 88.5 fL (ref 80.0–100.0)
Platelet Count: 209 10*3/uL (ref 150–400)
RBC: 4.44 MIL/uL (ref 3.87–5.11)
RDW: 12.3 % (ref 11.5–15.5)
WBC Count: 6.7 10*3/uL (ref 4.0–10.5)
nRBC: 0 % (ref 0.0–0.2)

## 2023-06-08 LAB — FERRITIN: Ferritin: 17 ng/mL (ref 11–307)

## 2023-06-08 LAB — IRON AND TIBC
Iron: 65 ug/dL (ref 28–170)
Saturation Ratios: 13 % (ref 10.4–31.8)
TIBC: 517 ug/dL — ABNORMAL HIGH (ref 250–450)
UIBC: 452 ug/dL

## 2023-06-08 NOTE — Progress Notes (Signed)
 Subjective:    Patient ID: Christine Mcneil, female    DOB: March 02, 1966, 58 y.o.   MRN: [id number] Chief Complaint  Patient presents with   New Patient (Initial Visit)    Ref [facility] consult right arm swelling and ble swelling    The patient is a 58 year old female who presents today as a referral from Dr. Florencio in regards to right upper extremity swelling.  The swelling has been going on for about a year.  She notes that the swelling is actually worse in the morning after she wakes up and actually gets better throughout the day.  She had previously tried wearing a sleeve for compression during the bedtime but she notes that it was very painful and it actually made her arm ache in the right.  In addition to having swelling of her arm when she wakes up the arm is also numb and tingly.  She notes that this numbness and tingling actually resolved as the day goes on in addition to the improvement in the swelling.  She notes that she always sleeps on her right side.  She also has a history of rotator cuff issues and shoulder issues in her right upper extremity.  She does note some pain with activity with using her arm as well.    Review of Systems  Neurological:  Positive for numbness.  All other systems reviewed and are negative.      Objective:   Physical Exam Vitals reviewed.  HENT:     Head: Normocephalic.  Cardiovascular:     Rate and Rhythm: Normal rate.     Pulses:          Radial pulses are 0 on the right side and 1+ on the left side.  Pulmonary:     Effort: Pulmonary effort is normal.  Skin:    General: Skin is warm and dry.  Neurological:     Mental Status: She is alert and oriented to person, place, and time.  Psychiatric:        Mood and Affect: Mood normal.        Behavior: Behavior normal.        Thought Content: Thought content normal.        Judgment: Judgment normal.     BP 121/79   Pulse 75   Resp 16   Ht 5' 6 (1.676 m)   Wt 240 lb 3.2 oz (109 kg)   LMP   (LMP Unknown)   BMI 38.77 kg/m   Past Medical History:  Diagnosis Date   Abdominal pain    Anxiety and depression    Atypical facial pain    Bilateral occipital neuralgia    Cervical spondylosis without myelopathy    Chronic daily headache    Chronic pain    Chronic pelvic pain in female    Chronic thoracic back pain    Depression    Diabetes mellitus without complication (HCC)    Diabetes mellitus, type II (HCC)    Dysphagia    Dysrhythmia    Fibromyalgia    Hyperlipidemia    Hypertension    Hypothyroidism    Insomnia    Iron  deficiency anemia    Left hip pain    Low back pain    Migraines    Numbness    Optic neuropathy    OSA (obstructive sleep apnea)    Polyarthralgia    Primary osteoarthritis of both hips    Prothrombin gene mutation (HCC)  Pseudotumor cerebri    Rectal bleeding    Right shoulder pain    Rotator cuff syndrome    Stroke [facility])    Thyroid disease    TIA (transient ischemic attack) 05/27/2017    Social History   Socioeconomic History   Marital status: Divorced    Spouse name: Not on file   Number of children: 1   Years of education: Not on file   Highest education level: High school graduate  Occupational History    Comment: disablitiy  Tobacco Use   Smoking status: Never   Smokeless tobacco: Never  Vaping Use   Vaping status: Never Used  Substance and Sexual Activity   Alcohol use: No   Drug use: No   Sexual activity: Not Currently  Other Topics Concern   Not on file  Social History Narrative   Not on file   Social Drivers of Health   Financial Resource Strain: Low Risk  (07/19/2017)   Overall Financial Resource Strain (CARDIA)    Difficulty of Paying Living Expenses: Not hard at all  Food Insecurity: No Food Insecurity (07/19/2017)   Hunger Vital Sign    Worried About Running Out of Food in the Last Year: Never true    Ran Out of Food in the Last Year: Never true  Transportation Needs: No Transportation Needs (07/19/2017)    PRAPARE - Administrator, Civil Service (Medical): No    Lack of Transportation (Non-Medical): No  Physical Activity: Inactive (07/19/2017)   Exercise Vital Sign    Days of Exercise per Week: 0 days    Minutes of Exercise per Session: 0 min  Stress: Stress Concern Present (07/19/2017)   [employer] of Occupational Health - Occupational Stress Questionnaire    Feeling of Stress : Very much  Social Connections: Moderately Isolated (07/19/2017)   Social Connection and Isolation Panel [NHANES]    Frequency of Communication with Friends and Family: Once a week    Frequency of Social Gatherings with Friends and Family: Never    Attends Religious Services: More than 4 times per year    Active Member of [employer] or Organizations: No    Attends Banker Meetings: Never    Marital Status: Divorced  Catering Manager Violence: Not At Risk (07/19/2017)   Humiliation, Afraid, Rape, and Kick questionnaire    Fear of Current or Ex-Partner: No    Emotionally Abused: No    Physically Abused: No    Sexually Abused: No    Past Surgical History:  Procedure Laterality Date   ABDOMINAL HYSTERECTOMY     total   CHOLECYSTECTOMY     COLONOSCOPY WITH PROPOFOL  N/A 03/21/2018   Procedure: COLONOSCOPY WITH PROPOFOL ;  Surgeon: Jinny Carmine, MD;  Location: [facility] ENDOSCOPY;  Service: Endoscopy;  Laterality: N/A;   ERCP N/A 08/19/2020   Procedure: ENDOSCOPIC RETROGRADE CHOLANGIOPANCREATOGRAPHY (ERCP);  Surgeon: Jinny Carmine, MD;  Location: [facility] ENDOSCOPY;  Service: Endoscopy;  Laterality: N/A;   ESOPHAGOGASTRODUODENOSCOPY (EGD) WITH PROPOFOL  N/A 03/21/2018   Procedure: ESOPHAGOGASTRODUODENOSCOPY (EGD) WITH PROPOFOL ;  Surgeon: Jinny Carmine, MD;  Location: [facility] ENDOSCOPY;  Service: Endoscopy;  Laterality: N/A;   GIVENS CAPSULE STUDY N/A 09/24/2019   Procedure: GIVENS CAPSULE STUDY;  Surgeon: Janalyn Keene NOVAK, MD;  Location: [facility] ENDOSCOPY;  Service: Endoscopy;  Laterality: N/A;   KNEE  SURGERY Left    LEFT HEART CATH AND CORONARY ANGIOGRAPHY N/A 03/10/2022   Procedure: LEFT HEART CATH AND CORONARY ANGIOGRAPHY;  Surgeon: Florencio Cara BIRCH, MD;  Location:  [facility] INVASIVE CV LAB;  Service: Cardiovascular;  Laterality: N/A;   ROTATOR CUFF REPAIR Right    spinal neurostimulator      Family History  Problem Relation Age of Onset   Diabetes Mother    Hyperlipidemia Mother    Hypertension Mother    COPD Mother    CVA Mother    Anemia Mother    Diabetes Father    Hyperlipidemia Father    Hypertension Father    Thyroid disease Father    Alcohol abuse Father    Peripheral vascular disease Sister    Hypertension Sister    Anxiety disorder Son    Depression Son    Bipolar disorder Son    Seizures Son     Allergies  Allergen Reactions   Amoxapine Itching   Amoxicillin Itching   Dapagliflozin Itching    Other reaction(s): Other (See Comments) Thrush & vaginal itching   Keflex [Cephalexin] Itching   Mirtazapine  Other (See Comments)    Pt states felling like my body is outside of me.       Latest Ref Rng & Units 02/22/2023    3:27 PM 12/15/2022    8:37 AM 09/29/2022    2:04 PM  CBC  WBC 4.0 - 10.5 K/uL  9.7  7.1   Hemoglobin 12.0 - 15.0 g/dL  86.8  86.2   Hematocrit 36.0 - 46.0 %  39.7  41.0   Platelets 150 - 450 x10E3/uL 239  199  184       CMP     Component Value Date/Time   NA 132 (L) 12/15/2022 0837   NA 137 10/21/2020 1110   NA 138 03/22/2014 1659   K 4.6 12/15/2022 0837   K 3.7 03/22/2014 1659   CL 98 12/15/2022 0837   CL 104 03/22/2014 1659   CO2 24 12/15/2022 0837   CO2 23 03/22/2014 1659   GLUCOSE 251 (H) 12/15/2022 0837   GLUCOSE 156 (H) 03/22/2014 1659   BUN 12 12/15/2022 0837   BUN 7 10/21/2020 1110   BUN 7 03/22/2014 1659   CREATININE 0.83 12/15/2022 0837   CREATININE 0.98 03/22/2014 1659   CALCIUM  9.0 12/15/2022 0837   CALCIUM  9.0 03/22/2014 1659   PROT 7.5 02/22/2023 1527   PROT 8.8 (H) 03/22/2014 1659   ALBUMIN 4.8 02/22/2023  1527   ALBUMIN 4.5 03/22/2014 1659   AST 44 (H) 02/22/2023 1527   AST 34 03/22/2014 1659   ALT 52 (H) 02/22/2023 1527   ALT 37 03/22/2014 1659   ALKPHOS 127 (H) 02/22/2023 1527   ALKPHOS 147 (H) 03/22/2014 1659   BILITOT 0.4 02/22/2023 1527   BILITOT 0.3 03/22/2014 1659   EGFR 67 10/21/2020 1110   GFRNONAA >60 12/15/2022 0837   GFRNONAA >60 03/22/2014 1659   GFRNONAA >60 09/15/2013 0131     No results found.     Assessment & Plan:   1. Lymphedema of arm (Primary) The patient's swelling pattern is very abnormal as well as the associated symptoms for this to be a true lymphedema of the arm.  In addition she has not had any surgeries or interventions in his arm that we usually preempt upper extremity lymphedema.  Based on her symptoms, I do have a suspicion that she may have thoracic outlet syndrome.  Will have her return for a right upper extremity duplex to evaluate and I discussed with her that she may need further evaluation to evaluate for possible thoracic outlet syndrome.  I have advised her  to try to sleep away from her right side such as on her left lower back to see if this also helps the swelling and the discomfort that she feels in the morning.  2. Right shoulder pain, unspecified chronicity This may also have some underlying musculoskeletal issues but this can be addressed following a vascular workup  3. Essential hypertension Continue antihypertensive medications as already ordered, these medications have been reviewed and there are no changes at this time.   Current Outpatient Medications on File Prior to Visit  Medication Sig Dispense Refill   ACCU-CHEK AVIVA PLUS test strip      ACCU-CHEK SOFTCLIX LANCETS lancets      Acetaminophen  (TYLENOL  ARTHRITIS EXT RELIEF PO) Take 600 mg by mouth daily.     albuterol  (VENTOLIN  HFA) 108 (90 Base) MCG/ACT inhaler Inhale 2 puffs into the lungs every 6 (six) hours as needed for wheezing or shortness of breath. 1 g 0    atorvastatin  (LIPITOR) 80 MG tablet Take 80 mg by mouth daily at 6 PM.     Blood Glucose Monitoring Suppl (ACCU-CHEK AVIVA PLUS) w/Device KIT      brexpiprazole  (REXULTI ) 0.25 MG TABS tablet Take 1 tablet (0.25 mg total) by mouth daily. Take along with 0.5 mg daily , total of 0.75 mg daily. 90 tablet 1   Brexpiprazole  (REXULTI ) 0.5 MG TABS Take 1 tablet (0.5 mg total) by mouth daily. Take along with 0.25 mg , total of 0.75 mg daily 90 tablet 1   Carboxymethylcellulose Sod PF 0.5 % SOLN Apply 1 drop to eye daily as needed.     Cholecalciferol  (VITAMIN D3) 1000 units CAPS Take 1 capsule by mouth daily.     clobetasol ointment (TEMOVATE) 0.05 % Apply topically.     Continuous Blood Gluc Receiver (DEXCOM G6 RECEIVER) DEVI Use to monitor blood sugar. NDC [phone]     Continuous Blood Gluc Sensor (DEXCOM G6 SENSOR) MISC Use to monitor blood sugar.  Replace every 10 days. NDC [phone].     Continuous Blood Gluc Transmit (DEXCOM G6 TRANSMITTER) MISC Use to monitor blood sugar.  Replace every 3 months. NDC [phone].     cyclobenzaprine (FLEXERIL) 5 MG tablet Take 1 tablet by mouth 3 (three) times daily as needed.     dicyclomine  (BENTYL ) 20 MG tablet Take 1 tablet (20 mg total) by mouth 3 (three) times daily before meals. 90 tablet 5   DILT-XR 240 MG 24 hr capsule Take 240 mg by mouth daily.     esomeprazole (NEXIUM) 40 MG capsule Take 40 mg by mouth daily.     ezetimibe (ZETIA) 10 MG tablet Take 10 mg by mouth at bedtime.     famotidine  (PEPCID ) 40 MG tablet Take 40 mg by mouth at bedtime.     ferrous sulfate 325 (65 FE) MG tablet Take by mouth. Take 325 mg daily with breakfast     fluticasone  (FLONASE ) 50 MCG/ACT nasal spray Place 2 sprays into both nostrils as needed.      furosemide (LASIX) 40 MG tablet TAKE (1) TABLET BY MOUTH EVERY DAY     gabapentin  (NEURONTIN ) 600 MG tablet Take 2 tablets (1,200 mg total) by mouth at bedtime. Prescribed by pain doc     HUMALOG KWIKPEN 200 UNIT/ML  KwikPen SMARTSIG:100 Unit(s) SUB-Q Daily     hydrOXYzine  (VISTARIL ) 25 MG capsule Take 1-2 capsules (25-50 mg total) by mouth 2 (two) times daily as needed for anxiety (and sleep). 360 capsule 0   Insulin  Disposable Pump (  OMNIPOD 5 G6 INTRO, GEN 5,) KIT SMARTSIG:1 SUB-Q Every 3 Days     Insulin  Pen Needle (TRUEPLUS PEN NEEDLES) 29G X MISC      levothyroxine  (SYNTHROID ) 112 MCG tablet Take 112 mcg by mouth daily.     lidocaine  4 % 1 patch daily.     magnesium oxide (MAG-OX) 400 MG tablet Take 1 tablet by mouth daily.     Melatonin 10 MG TABS Take 2 tablets by mouth at bedtime.     meloxicam (MOBIC) 15 MG tablet Take 15 mg by mouth daily.     metFORMIN  (GLUCOPHAGE -XR) 500 MG 24 hr tablet Take 500 mg by mouth 2 (two) times daily.      metoprolol  succinate (TOPROL -XL) 25 MG 24 hr tablet Take 1 tablet by mouth daily.     mometasone (ELOCON) 0.1 % cream Apply topically.     naloxone (NARCAN) nasal spray 4 mg/0.1 mL Place into the nose.     nystatin (MYCOSTATIN) 100000 UNIT/ML suspension Take 5 mLs by mouth 4 (four) times daily.     omeprazole  (PRILOSEC) 20 MG capsule TAKE (1) CAPSULE BY MOUTH EVERY DAY 30 capsule 0   ondansetron  (ZOFRAN ) 4 MG tablet Take 4 mg by mouth every 8 (eight) hours as needed.     RYBELSUS 7 MG TABS Take 1 tablet by mouth daily.     sertraline  (ZOLOFT ) 100 MG tablet Take 1.5 tablets (150 mg total) by mouth daily with breakfast. 135 tablet 0   Suvorexant  (BELSOMRA ) 20 MG TABS Take 1 tablet (20 mg total) by mouth at bedtime. 90 tablet 0   vitamin B-12 (CYANOCOBALAMIN ) 100 MCG tablet Take 1 tablet (100 mcg total) by mouth daily. 90 tablet 1   XTAMPZA  ER 9 MG C12A PLEASE SEE ATTACHED FOR DETAILED DIRECTIONS     zonisamide (ZONEGRAN) 100 MG capsule Take 100 mg by mouth daily.     dabigatran  (PRADAXA ) 150 MG CAPS capsule Take 150 mg by mouth 2 (two) times daily.     nitroGLYCERIN  (NITROSTAT ) 0.4 MG SL tablet Place under the tongue.     pioglitazone (ACTOS) 15 MG tablet Take  by mouth. Take 1 tablet by mouth once daily     Current Facility-Administered Medications on File Prior to Visit  Medication Dose Route Frequency Provider Last Rate Last Admin   iohexol  (OMNIPAQUE ) 300 MG/ML solution    PRN Callwood, Dwayne D, MD   60 mL at 03/10/22 1442    There are no Patient Instructions on file for this visit. No follow-ups on file.   Chanel Mckesson E Keishana Klinger, NP

## 2023-06-09 ENCOUNTER — Telehealth: Payer: Self-pay

## 2023-06-09 ENCOUNTER — Encounter: Payer: Self-pay | Admitting: Oncology

## 2023-06-09 NOTE — Progress Notes (Signed)
Hematology/Oncology Consult note [facility]  [phone] [phone]  Patient Care Team: Doughton, Margit Banda, MD as PCP - General (Student) Midge Minium, MD as Consulting Physician (Gastroenterology)   Name of the patient: Christine Mcneil  [id number]  10-Aug-1965   Date of visit: 06/09/23  Diagnosis-iron deficiency anemia  Chief complaint/ Reason for visit-routine follow-up of iron deficiency anemia  Heme/Onc history: Patient is a 58 year old female is a transfer of care from Dr. Merlene Pulling for history of iron deficiency anemia which has been followed since 2019.EGD on 03/21/2018 was normal.  Colonoscopy on 03/21/2018 revealed for 1 to 2 mm polyps in the sigmoid colon and internal hemorrhoids.  Pathology revealed hyperplastic polyps.  Patient also had capsule study in May 2021 which was inconclusive as it did not enter the small bowel.   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  She has a prothrombin gene mutation.  She is on long term anticoagulation with Pradaxa.   Interval history-patient is grieving the death of her mother who was under hospice.  She reports some ongoing fatigue.  Denies any blood loss in her stool or urine.  ECOG PS- 1 Pain scale- 0   Review of systems- Review of Systems  Constitutional:  Positive for malaise/fatigue. Negative for chills, fever and weight loss.  HENT:  Negative for congestion, ear discharge and nosebleeds.   Eyes:  Negative for blurred vision.  Respiratory:  Negative for cough, hemoptysis, sputum production, shortness of breath and wheezing.   Cardiovascular:  Negative for chest pain, palpitations, orthopnea and claudication.  Gastrointestinal:  Negative for abdominal pain, blood in stool, constipation, diarrhea, heartburn, melena, nausea and vomiting.  Genitourinary:  Negative for dysuria, flank pain, frequency, hematuria and urgency.  Musculoskeletal:  Negative for back pain, joint pain  and myalgias.  Skin:  Negative for rash.  Neurological:  Negative for dizziness, tingling, focal weakness, seizures, weakness and headaches.  Endo/Heme/Allergies:  Does not bruise/bleed easily.  Psychiatric/Behavioral:  Negative for depression and suicidal ideas. The patient does not have insomnia.       Allergies  Allergen Reactions   Amoxapine Itching   Amoxicillin Itching   Dapagliflozin Itching    Other reaction(s): Other (See Comments) Thrush & vaginal itching   Keflex [Cephalexin] Itching   Mirtazapine Other (See Comments)    Pt states felling "like my body is outside of me."     Past Medical History:  Diagnosis Date   Abdominal pain    Anxiety and depression    Atypical facial pain    Bilateral occipital neuralgia    Cervical spondylosis without myelopathy    Chronic daily headache    Chronic pain    Chronic pelvic pain in female    Chronic thoracic back pain    Depression    Diabetes mellitus without complication (HCC)    Diabetes mellitus, type II (HCC)    Dysphagia    Dysrhythmia    Fibromyalgia    Hyperlipidemia    Hypertension    Hypothyroidism    Insomnia    Iron deficiency anemia    Left hip pain    Low back pain    Migraines    Numbness    Optic neuropathy    OSA (obstructive sleep apnea)    Polyarthralgia    Primary osteoarthritis of both hips    Prothrombin gene mutation (HCC)    Pseudotumor cerebri    Rectal bleeding    Right shoulder pain  Rotator cuff syndrome    Stroke [facility])    Thyroid disease    TIA (transient ischemic attack) 05/27/2017     Past Surgical History:  Procedure Laterality Date   ABDOMINAL HYSTERECTOMY     total   CHOLECYSTECTOMY     COLONOSCOPY WITH PROPOFOL N/A 03/21/2018   Procedure: COLONOSCOPY WITH PROPOFOL;  Surgeon: Midge Minium, MD;  Location: [facility] ENDOSCOPY;  Service: Endoscopy;  Laterality: N/A;   ERCP N/A 08/19/2020   Procedure: ENDOSCOPIC RETROGRADE CHOLANGIOPANCREATOGRAPHY (ERCP);  Surgeon: Midge Minium, MD;  Location: [facility] ENDOSCOPY;  Service: Endoscopy;  Laterality: N/A;   ESOPHAGOGASTRODUODENOSCOPY (EGD) WITH PROPOFOL N/A 03/21/2018   Procedure: ESOPHAGOGASTRODUODENOSCOPY (EGD) WITH PROPOFOL;  Surgeon: Midge Minium, MD;  Location: [facility] ENDOSCOPY;  Service: Endoscopy;  Laterality: N/A;   GIVENS CAPSULE STUDY N/A 09/24/2019   Procedure: GIVENS CAPSULE STUDY;  Surgeon: Pasty Spillers, MD;  Location: [facility] ENDOSCOPY;  Service: Endoscopy;  Laterality: N/A;   KNEE SURGERY Left    LEFT HEART CATH AND CORONARY ANGIOGRAPHY N/A 03/10/2022   Procedure: LEFT HEART CATH AND CORONARY ANGIOGRAPHY;  Surgeon: Alwyn Pea, MD;  Location: [facility] INVASIVE CV LAB;  Service: Cardiovascular;  Laterality: N/A;   ROTATOR CUFF REPAIR Right    spinal neurostimulator      Social History   Socioeconomic History   Marital status: Divorced    Spouse name: Not on file   Number of children: 1   Years of education: Not on file   Highest education level: High school graduate  Occupational History    Comment: disablitiy  Tobacco Use   Smoking status: Never   Smokeless tobacco: Never  Vaping Use   Vaping status: Never Used  Substance and Sexual Activity   Alcohol use: No   Drug use: No   Sexual activity: Not Currently  Other Topics Concern   Not on file  Social History Narrative   Not on file   Social Drivers of Health   Financial Resource Strain: Low Risk  (07/19/2017)   Overall Financial Resource Strain (CARDIA)    Difficulty of Paying Living Expenses: Not hard at all  Food Insecurity: No Food Insecurity (07/19/2017)   Hunger Vital Sign    Worried About Running Out of Food in the Last Year: Never true    Ran Out of Food in the Last Year: Never true  Transportation Needs: No Transportation Needs (07/19/2017)   PRAPARE - Administrator, Civil Service (Medical): No    Lack of Transportation (Non-Medical): No  Physical Activity: Inactive (07/19/2017)   Exercise Vital Sign     Days of Exercise per Week: 0 days    Minutes of Exercise per Session: 0 min  Stress: Stress Concern Present (07/19/2017)   [employer] of Occupational Health - Occupational Stress Questionnaire    Feeling of Stress : Very much  Social Connections: Moderately Isolated (07/19/2017)   Social Connection and Isolation Panel [NHANES]    Frequency of Communication with Friends and Family: Once a week    Frequency of Social Gatherings with Friends and Family: Never    Attends Religious Services: More than 4 times per year    Active Member of [employer] or Organizations: No    Attends Banker Meetings: Never    Marital Status: Divorced  Catering manager Violence: Not At Risk (07/19/2017)   Humiliation, Afraid, Rape, and Kick questionnaire    Fear of Current or Ex-Partner: No    Emotionally Abused: No    Physically  Abused: No    Sexually Abused: No    Family History  Problem Relation Age of Onset   Diabetes Mother        Mother passed away on 06/09/23.   Hyperlipidemia Mother    Hypertension Mother    COPD Mother    CVA Mother    Anemia Mother    Diabetes Father    Hyperlipidemia Father    Hypertension Father    Thyroid disease Father    Alcohol abuse Father    Peripheral vascular disease Sister    Hypertension Sister    Anxiety disorder Son    Depression Son    Bipolar disorder Son    Seizures Son      Current Outpatient Medications:    ACCU-CHEK AVIVA PLUS test strip, , Disp: , Rfl:    ACCU-CHEK SOFTCLIX LANCETS lancets, , Disp: , Rfl:    Acetaminophen (TYLENOL ARTHRITIS EXT RELIEF PO), Take 600 mg by mouth daily., Disp: , Rfl:    albuterol (VENTOLIN HFA) 108 (90 Base) MCG/ACT inhaler, Inhale 2 puffs into the lungs every 6 (six) hours as needed for wheezing or shortness of breath., Disp: 1 g, Rfl: 0   atorvastatin (LIPITOR) 80 MG tablet, Take 80 mg by mouth daily at 6 PM., Disp: , Rfl:    Blood Glucose Monitoring Suppl (ACCU-CHEK AVIVA PLUS) w/Device KIT, , Disp:  , Rfl:    brexpiprazole (REXULTI) 0.25 MG TABS tablet, Take 1 tablet (0.25 mg total) by mouth daily. Take along with 0.5 mg daily , total of 0.75 mg daily., Disp: 90 tablet, Rfl: 1   Brexpiprazole (REXULTI) 0.5 MG TABS, Take 1 tablet (0.5 mg total) by mouth daily. Take along with 0.25 mg , total of 0.75 mg daily, Disp: 90 tablet, Rfl: 1   Carboxymethylcellulose Sod PF 0.5 % SOLN, Apply 1 drop to eye daily as needed., Disp: , Rfl:    Cholecalciferol (VITAMIN D3) 1000 units CAPS, Take 1 capsule by mouth daily., Disp: , Rfl:    clobetasol ointment (TEMOVATE) 0.05 %, Apply topically., Disp: , Rfl:    Continuous Blood Gluc Receiver (DEXCOM G6 RECEIVER) DEVI, Use to monitor blood sugar. NDC [phone], Disp: , Rfl:    Continuous Blood Gluc Sensor (DEXCOM G6 SENSOR) MISC, Use to monitor blood sugar.  Replace every 10 days. NDC [address]-10., Disp: , Rfl:    Continuous Blood Gluc Transmit (DEXCOM G6 TRANSMITTER) MISC, Use to monitor blood sugar.  Replace every 3 months. [facility] [address]-01., Disp: , Rfl:    cyclobenzaprine (FLEXERIL) 5 MG tablet, Take 1 tablet by mouth 3 (three) times daily as needed., Disp: , Rfl:    dabigatran (PRADAXA) 150 MG CAPS capsule, Take 150 mg by mouth 2 (two) times daily., Disp: , Rfl:    dicyclomine (BENTYL) 20 MG tablet, Take 1 tablet (20 mg total) by mouth 3 (three) times daily before meals., Disp: 90 tablet, Rfl: 5   DILT-XR 240 MG 24 hr capsule, Take 240 mg by mouth daily., Disp: , Rfl:    esomeprazole (NEXIUM) 40 MG capsule, Take 40 mg by mouth daily., Disp: , Rfl:    ezetimibe (ZETIA) 10 MG tablet, Take 10 mg by mouth at bedtime., Disp: , Rfl:    famotidine (PEPCID) 40 MG tablet, Take 40 mg by mouth at bedtime., Disp: , Rfl:    ferrous sulfate 325 (65 FE) MG tablet, Take by mouth. Take 325 mg daily with breakfast, Disp: , Rfl:    fluticasone (FLONASE) 50 MCG/ACT nasal spray,  Place 2 sprays into both nostrils as needed. , Disp: , Rfl:    furosemide (LASIX) 40 MG tablet,  TAKE (1) TABLET BY MOUTH EVERY DAY, Disp: , Rfl:    gabapentin (NEURONTIN) 600 MG tablet, Take 2 tablets (1,200 mg total) by mouth at bedtime. Prescribed by pain doc, Disp: , Rfl:    HUMALOG KWIKPEN 200 UNIT/ML KwikPen, SMARTSIG:100 Unit(s) SUB-Q Daily, Disp: , Rfl:    hydrOXYzine (VISTARIL) 25 MG capsule, Take 1-2 capsules (25-50 mg total) by mouth 2 (two) times daily as needed for anxiety (and sleep)., Disp: 360 capsule, Rfl: 0   Insulin Disposable Pump (OMNIPOD 5 G6 INTRO, GEN 5,) KIT, SMARTSIG:1 SUB-Q Every 3 Days, Disp: , Rfl:    Insulin Pen Needle (TRUEPLUS PEN NEEDLES) 29G X MISC, , Disp: , Rfl:    levothyroxine (SYNTHROID) 112 MCG tablet, Take 112 mcg by mouth daily., Disp: , Rfl:    lidocaine 4 %, 1 patch daily., Disp: , Rfl:    magnesium oxide (MAG-OX) 400 MG tablet, Take 1 tablet by mouth daily., Disp: , Rfl:    Melatonin 10 MG TABS, Take 2 tablets by mouth at bedtime., Disp: , Rfl:    meloxicam (MOBIC) 15 MG tablet, Take 15 mg by mouth daily., Disp: , Rfl:    metFORMIN (GLUCOPHAGE-XR) 500 MG 24 hr tablet, Take 500 mg by mouth 2 (two) times daily. , Disp: , Rfl:    metoprolol succinate (TOPROL-XL) 25 MG 24 hr tablet, Take 1 tablet by mouth daily., Disp: , Rfl:    mometasone (ELOCON) 0.1 % cream, Apply topically., Disp: , Rfl:    naloxone (NARCAN) nasal spray 4 mg/0.1 mL, Place into the nose., Disp: , Rfl:    nitroGLYCERIN (NITROSTAT) 0.4 MG SL tablet, Place under the tongue., Disp: , Rfl:    nystatin (MYCOSTATIN) 100000 UNIT/ML suspension, Take 5 mLs by mouth 4 (four) times daily., Disp: , Rfl:    omeprazole (PRILOSEC) 20 MG capsule, TAKE (1) CAPSULE BY MOUTH EVERY DAY, Disp: 30 capsule, Rfl: 0   ondansetron (ZOFRAN) 4 MG tablet, Take 4 mg by mouth every 8 (eight) hours as needed., Disp: , Rfl:    pioglitazone (ACTOS) 15 MG tablet, Take by mouth. Take 1 tablet by mouth once daily, Disp: , Rfl:    RYBELSUS 7 MG TABS, Take 1 tablet by mouth daily., Disp: , Rfl:    sertraline  (ZOLOFT) 100 MG tablet, Take 1.5 tablets (150 mg total) by mouth daily with breakfast., Disp: 135 tablet, Rfl: 0   Suvorexant (BELSOMRA) 20 MG TABS, Take 1 tablet (20 mg total) by mouth at bedtime., Disp: 90 tablet, Rfl: 0   vitamin B-12 (CYANOCOBALAMIN) 100 MCG tablet, Take 1 tablet (100 mcg total) by mouth daily., Disp: 90 tablet, Rfl: 1   XTAMPZA ER 9 MG C12A, PLEASE SEE ATTACHED FOR DETAILED DIRECTIONS, Disp: , Rfl:    zonisamide (ZONEGRAN) 100 MG capsule, Take 100 mg by mouth daily., Disp: , Rfl:  No current facility-administered medications for this visit.  Facility-Administered Medications Ordered in Other Visits:    iohexol (OMNIPAQUE) 300 MG/ML solution, , , PRN, Callwood, Dwayne D, MD, 60 mL at 03/10/22 1442  Physical exam:  Vitals:   06/08/23 1519  BP: 128/79  Pulse: 65  Resp: 19  Temp: 98.8 F (37.1 C)  TempSrc: Tympanic  SpO2: 97%  Height: 5\' 6"  (1.676 m)   Physical Exam Cardiovascular:     Rate and Rhythm: Normal rate and regular rhythm.  Heart sounds: Normal heart sounds.  Pulmonary:     Effort: Pulmonary effort is normal.  Skin:    General: Skin is warm and dry.  Neurological:     Mental Status: She is alert and oriented to person, place, and time.         Latest Ref Rng & Units 02/22/2023    3:27 PM  CMP  Total Protein 6.0 - 8.5 g/dL 7.5   Total Bilirubin 0.0 - 1.2 mg/dL 0.4   Alkaline Phos 44 - 121 IU/L 127   AST 0 - 40 IU/L 44   ALT 0 - 32 IU/L 52       Latest Ref Rng & Units 06/08/2023    2:39 PM  CBC  WBC 4.0 - 10.5 K/uL 6.7   Hemoglobin 12.0 - 15.0 g/dL 16.1   Hematocrit 09.6 - 46.0 % 39.3   Platelets 150 - 400 K/uL 209     No images are attached to the encounter.  No results found.   Assessment and plan- Patient is a 58 y.o. female here for routine follow-up of iron deficiency anemia  Patient has not required IV iron over a year.  She is not presently anemic but iron studies are suggestive of iron deficiency.  We will therefore  proceed with 2 doses of Feraheme at this time.  CBC ferritin and iron studies in 6 months in 1 year and I will see her back in 1 year   Visit Diagnosis 1. Other iron deficiency anemia   2. B12 deficiency      Dr. Owens Shark, MD, MPH [facility] [phone] 06/09/2023 6:33 PM

## 2023-06-09 NOTE — Telephone Encounter (Signed)
Called patient to ask if she would be interested in IV iron per Dr. Smith Robert. Patient verbally agreed to do an IV iron infusion. I let patient know that our scheduler Aundra Millet) will reach out to her & get her scheduled.

## 2023-06-13 ENCOUNTER — Encounter: Payer: Self-pay | Admitting: Oncology

## 2023-06-14 ENCOUNTER — Inpatient Hospital Stay: Payer: 59

## 2023-06-14 VITALS — BP 104/66 | HR 65 | Temp 97.5°F

## 2023-06-14 DIAGNOSIS — D509 Iron deficiency anemia, unspecified: Secondary | ICD-10-CM | POA: Diagnosis not present

## 2023-06-14 MED ORDER — SODIUM CHLORIDE 0.9 % IV SOLN
INTRAVENOUS | Status: DC
  Filled 2023-06-14: qty 250

## 2023-06-14 MED ORDER — SODIUM CHLORIDE 0.9 % IV SOLN
510.0000 mg | INTRAVENOUS | Status: DC
  Administered 2023-06-14: 510 mg via INTRAVENOUS
  Filled 2023-06-14: qty 510

## 2023-06-15 ENCOUNTER — Telehealth (INDEPENDENT_AMBULATORY_CARE_PROVIDER_SITE_OTHER): Payer: 59 | Admitting: Psychiatry

## 2023-06-15 ENCOUNTER — Encounter: Payer: Self-pay | Admitting: Psychiatry

## 2023-06-15 DIAGNOSIS — F3341 Major depressive disorder, recurrent, in partial remission: Secondary | ICD-10-CM

## 2023-06-15 DIAGNOSIS — G4701 Insomnia due to medical condition: Secondary | ICD-10-CM

## 2023-06-15 DIAGNOSIS — F411 Generalized anxiety disorder: Secondary | ICD-10-CM | POA: Diagnosis not present

## 2023-06-15 DIAGNOSIS — Z634 Disappearance and death of family member: Secondary | ICD-10-CM

## 2023-06-15 DIAGNOSIS — R52 Pain, unspecified: Secondary | ICD-10-CM

## 2023-06-15 DIAGNOSIS — F4310 Post-traumatic stress disorder, unspecified: Secondary | ICD-10-CM | POA: Diagnosis not present

## 2023-06-15 MED ORDER — LEMBOREXANT 5 MG PO TABS: 5.0000 mg | ORAL_TABLET | Freq: Every day | ORAL | 0 refills | Status: DC

## 2023-06-15 NOTE — Progress Notes (Signed)
Virtual Visit via Video Note  I connected with Christine Mcneil on 06/15/23 at 10:30 AM EST by a video enabled telemedicine application and verified that I am speaking with the correct person using two identifiers.  Location Provider Location : [facility] Patient Location : Home  Participants: Patient , Provider   I discussed the limitations of evaluation and management by telemedicine and the availability of in person appointments. The patient expressed understanding and agreed to proceed.    I discussed the assessment and treatment plan with the patient. The patient was provided an opportunity to ask questions and all were answered. The patient agreed with the plan and demonstrated an understanding of the instructions.   The patient was advised to call back or seek an in-person evaluation if the symptoms worsen or if the condition fails to improve as anticipated.   BH MD OP Progress Note  06/15/2023 11:02 AM Christine Mcneil  MRN:  [id number]  Chief Complaint:  Chief Complaint  Patient presents with   Depression   Follow-up   Medication Refill   Grief   HPI: Christine Mcneil is a 58 year old Caucasian female, divorced, lives in [place], has a history of MDD, PTSD, insomnia, GAD, history of CVA, history of prothrombin gene mutation, chronic migraine headaches, B12 deficiency, iron deficiency was evaluated by telemedicine today.  The patient, diagnosed with major depression, PTSD, generalized anxiety, and sleep issues, reports a recent significant life event, the passing of her mother on January 1st. She describes this period as emotionally challenging, with frequent crying spells. Despite the emotional distress, she notes a positive aspect of family members setting aside differences and coming together during this time.  The patient has been taking Zoloft 150mg , Rexulti, and Belsomra for her mental health and sleep issues. She reports no improvement in sleep, describing a state of constant alertness  and an inability to shut down her mind at night. She denies daytime napping and expresses frustration with her current sleep medication, Belsomra, which she feels is not effective.  In addition to her mental health concerns, the patient reports significant physical pain. She is currently on Xtampza and extra strength Tylenol as needed for pain management. She mentions a suspected diagnosis of thoracic outlet syndrome, which is causing swelling in her right arm and is scheduled for further testing.   She denies any thoughts of self-harm or harm to others.  Patient agreeable to establishing care with therapist and reports she is not sure why the appointment was not scheduled as discussed at her last visit.  Visit Diagnosis:    ICD-10-CM   1. Recurrent major depressive disorder, in partial remission (HCC)  F33.41     2. PTSD (post-traumatic stress disorder)  F43.10     3. GAD (generalized anxiety disorder)  F41.1     4. Insomnia due to medical condition  G47.01 Lemborexant 5 MG TABS   Mood symptoms, pain, grief    5. Bereavement  Z63.4       Past Psychiatric History: I have reviewed past psychiatric history from progress note on 07/19/2017.  Past trials of medications like Effexor, Klonopin, mirtazapine, trazodone, Belsomra  Past Medical History:  Past Medical History:  Diagnosis Date   Abdominal pain    Anxiety and depression    Atypical facial pain    Bilateral occipital neuralgia    Cervical spondylosis without myelopathy    Chronic daily headache    Chronic pain    Chronic pelvic pain in female  Chronic thoracic back pain    Depression    Diabetes mellitus without complication (HCC)    Diabetes mellitus, type II (HCC)    Dysphagia    Dysrhythmia    Fibromyalgia    Hyperlipidemia    Hypertension    Hypothyroidism    Insomnia    Iron deficiency anemia    Left hip pain    Low back pain    Migraines    Numbness    Optic neuropathy    OSA (obstructive sleep apnea)     Polyarthralgia    Primary osteoarthritis of both hips    Prothrombin gene mutation (HCC)    Pseudotumor cerebri    Rectal bleeding    Right shoulder pain    Rotator cuff syndrome    Stroke [facility])    Thyroid disease    TIA (transient ischemic attack) 05/27/2017    Past Surgical History:  Procedure Laterality Date   ABDOMINAL HYSTERECTOMY     total   CHOLECYSTECTOMY     COLONOSCOPY WITH PROPOFOL N/A 03/21/2018   Procedure: COLONOSCOPY WITH PROPOFOL;  Surgeon: Midge Minium, MD;  Location: [facility] ENDOSCOPY;  Service: Endoscopy;  Laterality: N/A;   ERCP N/A 08/19/2020   Procedure: ENDOSCOPIC RETROGRADE CHOLANGIOPANCREATOGRAPHY (ERCP);  Surgeon: Midge Minium, MD;  Location: [facility] ENDOSCOPY;  Service: Endoscopy;  Laterality: N/A;   ESOPHAGOGASTRODUODENOSCOPY (EGD) WITH PROPOFOL N/A 03/21/2018   Procedure: ESOPHAGOGASTRODUODENOSCOPY (EGD) WITH PROPOFOL;  Surgeon: Midge Minium, MD;  Location: [facility] ENDOSCOPY;  Service: Endoscopy;  Laterality: N/A;   GIVENS CAPSULE STUDY N/A 09/24/2019   Procedure: GIVENS CAPSULE STUDY;  Surgeon: Pasty Spillers, MD;  Location: [facility] ENDOSCOPY;  Service: Endoscopy;  Laterality: N/A;   KNEE SURGERY Left    LEFT HEART CATH AND CORONARY ANGIOGRAPHY N/A 03/10/2022   Procedure: LEFT HEART CATH AND CORONARY ANGIOGRAPHY;  Surgeon: Alwyn Pea, MD;  Location: [facility] INVASIVE CV LAB;  Service: Cardiovascular;  Laterality: N/A;   ROTATOR CUFF REPAIR Right    spinal neurostimulator      Family Psychiatric History: I have reviewed family psychiatric history from progress note on 07/19/2017.  Family History:  Family History  Problem Relation Age of Onset   Diabetes Mother        Mother passed away on 06/19/2023.   Hyperlipidemia Mother    Hypertension Mother    COPD Mother    CVA Mother    Anemia Mother    Diabetes Father    Hyperlipidemia Father    Hypertension Father    Thyroid disease Father    Alcohol abuse Father    Peripheral vascular disease  Sister    Hypertension Sister    Anxiety disorder Son    Depression Son    Bipolar disorder Son    Seizures Son     Social History: I have reviewed social history from progress note on 07/19/2017. Social History   Socioeconomic History   Marital status: Divorced    Spouse name: Not on file   Number of children: 1   Years of education: Not on file   Highest education level: High school graduate  Occupational History    Comment: disablitiy  Tobacco Use   Smoking status: Never   Smokeless tobacco: Never  Vaping Use   Vaping status: Never Used  Substance and Sexual Activity   Alcohol use: No   Drug use: No   Sexual activity: Not Currently  Other Topics Concern   Not on file  Social History Narrative   Not on  file   Social Drivers of Health   Financial Resource Strain: Low Risk  (07/19/2017)   Overall Financial Resource Strain (CARDIA)    Difficulty of Paying Living Expenses: Not hard at all  Food Insecurity: No Food Insecurity (07/19/2017)   Hunger Vital Sign    Worried About Running Out of Food in the Last Year: Never true    Ran Out of Food in the Last Year: Never true  Transportation Needs: No Transportation Needs (07/19/2017)   PRAPARE - Administrator, Civil Service (Medical): No    Lack of Transportation (Non-Medical): No  Physical Activity: Inactive (07/19/2017)   Exercise Vital Sign    Days of Exercise per Week: 0 days    Minutes of Exercise per Session: 0 min  Stress: Stress Concern Present (07/19/2017)   [employer] of Occupational Health - Occupational Stress Questionnaire    Feeling of Stress : Very much  Social Connections: Moderately Isolated (07/19/2017)   Social Connection and Isolation Panel [NHANES]    Frequency of Communication with Friends and Family: Once a week    Frequency of Social Gatherings with Friends and Family: Never    Attends Religious Services: More than 4 times per year    Active Member of [employer] or Organizations: No     Attends Banker Meetings: Never    Marital Status: Divorced    Allergies:  Allergies  Allergen Reactions   Amoxapine Itching   Amoxicillin Itching   Dapagliflozin Itching    Other reaction(s): Other (See Comments) Thrush & vaginal itching   Keflex [Cephalexin] Itching   Mirtazapine Other (See Comments)    Pt states felling "like my body is outside of me."    Metabolic Disorder Labs: Lab Results  Component Value Date   HGBA1C 9.9 (H) 06/21/2017   MPG 237.43 06/21/2017   Lab Results  Component Value Date   PROLACTIN 4.3 (L) 05/26/2021   PROLACTIN 5.5 09/06/2017   Lab Results  Component Value Date   CHOL 161 02/22/2023   TRIG 294 (H) 02/22/2023   HDL 32 (L) 05/26/2021   CHOLHDL 4.9 (H) 05/26/2021   VLDL 39 06/21/2017   LDLCALC 84 05/26/2021   LDLCALC 174 (H) 06/21/2017   Lab Results  Component Value Date   TSH 2.890 09/06/2017   TSH 3.176 01/04/2017    Therapeutic Level Labs: No results found for: "LITHIUM" No results found for: "VALPROATE" No results found for: "CBMZ"  Current Medications: Current Outpatient Medications  Medication Sig Dispense Refill   Lemborexant 5 MG TABS Take 1 tablet (5 mg total) by mouth at bedtime. Stop the Suvorexant ( belsomra) 30 tablet 0   methocarbamol (ROBAXIN) 500 MG tablet Take 500 mg by mouth 2 (two) times daily as needed.     ACCU-CHEK AVIVA PLUS test strip      ACCU-CHEK SOFTCLIX LANCETS lancets      Acetaminophen (TYLENOL ARTHRITIS EXT RELIEF PO) Take 600 mg by mouth daily.     albuterol (VENTOLIN HFA) 108 (90 Base) MCG/ACT inhaler Inhale 2 puffs into the lungs every 6 (six) hours as needed for wheezing or shortness of breath. 1 g 0   atorvastatin (LIPITOR) 80 MG tablet Take 80 mg by mouth daily at 6 PM.     Blood Glucose Monitoring Suppl (ACCU-CHEK AVIVA PLUS) w/Device KIT      brexpiprazole (REXULTI) 0.25 MG TABS tablet Take 1 tablet (0.25 mg total) by mouth daily. Take along with 0.5 mg daily , total of  0.75  mg daily. 90 tablet 1   Brexpiprazole (REXULTI) 0.5 MG TABS Take 1 tablet (0.5 mg total) by mouth daily. Take along with 0.25 mg , total of 0.75 mg daily 90 tablet 1   Carboxymethylcellulose Sod PF 0.5 % SOLN Apply 1 drop to eye daily as needed.     Cholecalciferol (VITAMIN D3) 1000 units CAPS Take 1 capsule by mouth daily.     clobetasol ointment (TEMOVATE) 0.05 % Apply topically.     Continuous Blood Gluc Receiver (DEXCOM G6 RECEIVER) DEVI Use to monitor blood sugar. NDC [phone]     Continuous Blood Gluc Sensor (DEXCOM G6 SENSOR) MISC Use to monitor blood sugar.  Replace every 10 days. NDC [phone].     Continuous Blood Gluc Transmit (DEXCOM G6 TRANSMITTER) MISC Use to monitor blood sugar.  Replace every 3 months. NDC [phone].     cyclobenzaprine (FLEXERIL) 5 MG tablet Take 1 tablet by mouth 3 (three) times daily as needed.     dabigatran (PRADAXA) 150 MG CAPS capsule Take 150 mg by mouth 2 (two) times daily.     dicyclomine (BENTYL) 20 MG tablet Take 1 tablet (20 mg total) by mouth 3 (three) times daily before meals. 90 tablet 5   DILT-XR 240 MG 24 hr capsule Take 240 mg by mouth daily.     esomeprazole (NEXIUM) 40 MG capsule Take 40 mg by mouth daily.     ezetimibe (ZETIA) 10 MG tablet Take 10 mg by mouth at bedtime.     famotidine (PEPCID) 40 MG tablet Take 40 mg by mouth at bedtime.     ferrous sulfate 325 (65 FE) MG tablet Take by mouth. Take 325 mg daily with breakfast     fluticasone (FLONASE) 50 MCG/ACT nasal spray Place 2 sprays into both nostrils as needed.      furosemide (LASIX) 40 MG tablet TAKE (1) TABLET BY MOUTH EVERY DAY     gabapentin (NEURONTIN) 600 MG tablet Take 2 tablets (1,200 mg total) by mouth at bedtime. Prescribed by pain doc     HUMALOG KWIKPEN 200 UNIT/ML KwikPen SMARTSIG:100 Unit(s) SUB-Q Daily     hydrOXYzine (VISTARIL) 25 MG capsule Take 1-2 capsules (25-50 mg total) by mouth 2 (two) times daily as needed for anxiety (and sleep). 360 capsule 0    Insulin Disposable Pump (OMNIPOD 5 G6 INTRO, GEN 5,) KIT SMARTSIG:1 SUB-Q Every 3 Days     Insulin Pen Needle (TRUEPLUS PEN NEEDLES) 29G X MISC      levothyroxine (SYNTHROID) 112 MCG tablet Take 112 mcg by mouth daily.     lidocaine 4 % 1 patch daily.     magnesium oxide (MAG-OX) 400 MG tablet Take 1 tablet by mouth daily.     Melatonin 10 MG TABS Take 2 tablets by mouth at bedtime.     meloxicam (MOBIC) 15 MG tablet Take 15 mg by mouth daily.     metFORMIN (GLUCOPHAGE-XR) 500 MG 24 hr tablet Take 500 mg by mouth 2 (two) times daily.      metoprolol succinate (TOPROL-XL) 25 MG 24 hr tablet Take 1 tablet by mouth daily.     mometasone (ELOCON) 0.1 % cream Apply topically.     naloxone (NARCAN) nasal spray 4 mg/0.1 mL Place into the nose.     nitroGLYCERIN (NITROSTAT) 0.4 MG SL tablet Place under the tongue.     nystatin (MYCOSTATIN) 100000 UNIT/ML suspension Take 5 mLs by mouth 4 (four) times daily.     omeprazole (  PRILOSEC) 20 MG capsule TAKE (1) CAPSULE BY MOUTH EVERY DAY 30 capsule 0   ondansetron (ZOFRAN) 4 MG tablet Take 4 mg by mouth every 8 (eight) hours as needed.     pioglitazone (ACTOS) 15 MG tablet Take by mouth. Take 1 tablet by mouth once daily     RYBELSUS 7 MG TABS Take 1 tablet by mouth daily.     sertraline (ZOLOFT) 100 MG tablet Take 1.5 tablets (150 mg total) by mouth daily with breakfast. 135 tablet 0   vitamin B-12 (CYANOCOBALAMIN) 100 MCG tablet Take 1 tablet (100 mcg total) by mouth daily. 90 tablet 1   XTAMPZA ER 9 MG C12A PLEASE SEE ATTACHED FOR DETAILED DIRECTIONS     zonisamide (ZONEGRAN) 100 MG capsule Take 100 mg by mouth daily.     No current facility-administered medications for this visit.   Facility-Administered Medications Ordered in Other Visits  Medication Dose Route Frequency Provider Last Rate Last Admin   iohexol (OMNIPAQUE) 300 MG/ML solution    PRN Callwood, Dwayne D, MD   60 mL at 03/10/22 1442     Musculoskeletal: Strength & Muscle Tone:   UTA Gait & Station:  Seated Patient leans: N/A  Psychiatric Specialty Exam: Review of Systems  Musculoskeletal:  Positive for back pain.  Psychiatric/Behavioral:  Positive for dysphoric mood and sleep disturbance.        Grieving    There were no vitals taken for this visit.There is no height or weight on file to calculate BMI.  General Appearance: Fairly Groomed  Eye Contact:  Fair  Speech:  Normal Rate  Volume:  Normal  Mood:  Dysphoric and grieving  Affect:  Congruent  Thought Process:  Goal Directed and Descriptions of Associations: Intact  Orientation:  Full (Time, Place, and Person)  Thought Content: Logical   Suicidal Thoughts:  No  Homicidal Thoughts:  No  Memory:  Immediate;   Fair Recent;   Fair Remote;   Fair  Judgement:  Fair  Insight:  Fair  Psychomotor Activity:  Normal  Concentration:  Concentration: Fair and Attention Span: Fair  Recall:  [employer] of Knowledge: Fair  Language: Fair  Akathisia:  No  Handed:  Right  AIMS (if indicated): not done  Assets:  Desire for Improvement Housing Social Support Transportation  ADL's:  Intact  Cognition: WNL  Sleep:  Poor   Screenings: Geneticist, molecular Office Visit from 05/09/2023 in [facility] Office Visit from 03/23/2023 in [facility] Office Visit from 02/21/2023 in [facility] Office Visit from 01/26/2023 in [facility] Office Visit from 01/07/2023 in [facility]  AIMS Total Score 0 0 0 0 0      GAD-7    Flowsheet Row Office Visit from 05/09/2023 in [facility] Office Visit from 03/23/2023 in [facility] Office Visit from 02/21/2023 in [facility] Office Visit from 01/26/2023 in [facility] Office Visit from 01/07/2023 in [facility]  Total GAD-7 Score 11 11 19 18 13       PHQ2-9    Flowsheet Row Office Visit from 05/09/2023 in [facility] Office Visit from 03/23/2023 in [facility] Office Visit from 02/21/2023 in [facility]  Office Visit from 01/26/2023 in [facility] Office Visit from 01/07/2023 in [facility]  PHQ-2 Total Score 3 5 6 6 4   PHQ-9 Total Score 11 12 15 14 11       Flowsheet Row Video Visit from 06/15/2023 in [facility] Office Visit from 05/09/2023 in [facility] Office Visit from 03/23/2023 in [facility]  C-SSRS RISK CATEGORY Moderate Risk Moderate Risk Moderate Risk        Assessment and Plan: KARILEE TAFFE is a 58 year old Caucasian female who has a history of MDD, PTSD, multiple medical problems currently grieving the loss of her mother who passed away 2 weeks ago, discussed assessment and plan as noted below.  Major Depression in partial remission Currently on sertraline 150 mg daily, increased at the last visit. Reports crying spells and difficulty coping with the recent loss of her mother. No suicidal ideation. Discussed the importance of therapy for grief and loss.   - Continue sertraline 150 mg daily   - Schedule appointment with therapist   - Follow up in one month via video visit    Generalized Anxiety Disorder-improving Currently on Rexulti and sertraline. No changes in medication discussed.   - Continue sertraline 150 mg daily - Refered patient for psychotherapy, I have communicated with staff. - Hydroxyzine 25-50 mg twice a day as  needed.   Post-Traumatic Stress Disorder (PTSD)-improving No specific changes or new symptoms discussed.   - Continue current management   - Patient referred for psychotherapy.  Insomnia-unstable Significant difficulty sleeping despite Belsomra. Describes mind racing and inability to rest. Pain from thoracic outlet syndrome may contribute to sleep disturbances. Discussed changing to lemborexant and potential insurance coverage issues. Informed about possible side effects due to interaction with diltiazem, including increased sedation and risk of falls.   - Discontinue Belsomra   - Start lemborexant 5 mg, increase to 10 mg if needed   - Monitor for side effects due to potential interaction with diltiazem   - Reviewed [facility] PMP AWARxE - Follow up if sleep issues persist    Bereavement-unstable Patient is currently grieving the loss of her mother who passed away 2023-06-13. - Will refer for grief counseling with therapist - Provided grief counseling.   Follow-up   - Schedule follow-up video visit on February 14th at 10:00 AM   - Contact front desk to schedule therapy appointment.   Collaboration of Care: Collaboration of Care: Referral or follow-up with counselor/therapist AEB encouraged to schedule appointment with therapist, I have communicated with staff to contact this patient for the same.  Patient also encouraged to follow up with pain provider for management of pain as needed.  Patient/Guardian was advised Release of Information must be obtained prior to any record release in order to collaborate their care with an outside provider. Patient/Guardian was advised if they have not already done so to contact the registration department to sign all necessary forms in order for [name] to release information regarding their care.   Consent: Patient/Guardian gives verbal consent for treatment and assignment of benefits for services provided during this visit. Patient/Guardian expressed  understanding and agreed to proceed.   This note was generated in part or whole with voice recognition software. Voice recognition is usually quite accurate but there are transcription errors that can and very often do occur. I apologize for any typographical errors that were not detected and  corrected.    Jomarie Longs, MD 06/15/2023, 11:02 AM

## 2023-06-15 NOTE — Patient Instructions (Signed)
Lemborexant Tablets What is this medication? LEMBOREXANT (LEM boe REX ant) treats insomnia. It helps you go to sleep faster and stay asleep through the night. It is often used for a short period of time. This medicine may be used for other purposes; ask your health care provider or pharmacist if you have questions. COMMON BRAND NAME(S): DAYVIGO What should I tell my care team before I take this medication? They need to know if you have any of these conditions: Depression Frequently drink alcohol History of falling asleep often at unexpected times (narcolepsy) History of substance use disorder History of a sudden onset of muscle weakness (cataplexy) Liver disease Lung or breathing disease Sleep apnea Suicidal thoughts, plans, or attempt by you or a family member Unusual sleep behaviors or activities you do not remember An unusual or allergic reaction to lemborexant, other medications, foods, dyes, or preservatives Pregnant or trying to get pregnant Breastfeeding How should I use this medication? Take this medication by mouth with water right before going to bed. Follow the directions on the prescription label. It is better to take this medication on an empty stomach. Do not take your medication more often than directed. A special MedGuide will be given to you by the pharmacist with each prescription and refill. Be sure to read this information carefully each time. Talk to your care team about the use of this medication in children. Special care may be needed. Overdosage: If you think you have taken too much of this medicine contact a poison control center or emergency room at once. NOTE: This medicine is only for you. Do not share this medicine with others. What if I miss a dose? This does not apply. This medication should only be taken immediately before going to sleep. Do not take double or extra doses. What may interact with this medication? Alcohol Antihistamines for allergy, cough,  or cold Bosentan Bupropion Certain antibiotics, such as erythromycin or clarithromycin Certain antivirals for HIV or hepatitis Certain medications for depression, anxiety, or mental health conditions Certain medications for fungal infections, such as fluconazole, itraconazole, ketoconazole, posaconazole, voriconazole Certain medications for seizures, such as carbamazepine, phenytoin, phenobarbital Chlorzoxazone Diltiazem General anesthetics, such as halothane, isoflurane, methoxyflurane, propofol Grapefruit juice Medications that relax muscles for surgery Modafinil Opioid medications for pain Other medications for sleep Ranitidine Rifampin St. John's Wort Verapamil This list may not describe all possible interactions. Give your health care provider a list of all the medicines, herbs, non-prescription drugs, or dietary supplements you use. Also tell them if you smoke, drink alcohol, or use illegal drugs. Some items may interact with your medicine. What should I watch for while using this medication? Visit your care team for regular checks on your progress. Keep a regular sleep schedule by going to bed at about the same time each night. Avoid caffeine-containing drinks in the evening hours. Talk to your care team if your insomnia worsens or is not better within 7 to 10 days. Plan to go to bed and stay in bed for a full night (7 to 8 hours) after you take this medication. You may still be drowsy the morning after taking this medication. This medication may affect your coordination, reaction time, or judgment. Do not drive or operate machinery until you know how this medication affects you. Sit up or stand slowly to reduce the risk of dizzy or fainting spells. You may do unusual sleep behaviors or activities you do not remember the day after taking this medication. Activities include driving, making  or eating food, talking on the phone, sexual activity, and sleep-walking. Stop taking this  medication and call your care team right away if you find out you have done activities like this. Watch for new or worsening thoughts of suicide or depression. This includes sudden changes in mood, behaviors, or thoughts. These changes can happen at any time but are more common in the beginning of treatment or after a change in dose. Call your care team right away if you experience these thoughts or worsening depression. After you stop taking this medication, you may have trouble falling asleep. This is called rebound insomnia. This problem usually goes away on its own after 1 or 2 nights. What side effects may I notice from receiving this medication? Side effects that you should report to your care team as soon as possible: Allergic reactions--skin rash, itching, hives, swelling of the face, lips, tongue, or throat CNS depression--slow or shallow breathing, shortness of breath, feeling faint, dizziness, confusion, trouble staying awake Mood and behavior changes--anxiety, nervousness, confusion, hallucinations, irritability, hostility, thoughts of suicide or self-harm, worsening mood, feelings of depression Unable to move or speak for several minutes upon waking or going to sleep Unusual sleep behaviors or activities you do not remember such as driving, eating, or sexual activity Side effects that usually do not require medical attention (report these to your care team if they continue or are bothersome): Drowsiness the day after use Fatigue Headache Vivid dreams or nightmares This list may not describe all possible side effects. Call your doctor for medical advice about side effects. You may report side effects to FDA at 1-800-FDA-1088. Where should I keep my medication? Keep out of the reach of children and pets. This medication can be abused. Keep it in a safe place to protect it from theft. Do not share it with anyone. It is only for you. Selling or giving away this medication is dangerous and  against the law. Store between 20 and 25 degrees C (68 and 77 degrees F). Get rid of any unused medication after the expiration date. This medication may cause harm and death if it is taken by other adults, children, or pets. It is important to get rid of the medication as soon as you no longer need it or it is expired. You can do this in two ways: Take the medication to a medication take-back program. Check with your pharmacy or law enforcement to find a location. If you cannot return the medication, check the label or package insert to see if the medication should be thrown out in the garbage or flushed down the toilet. If you are not sure, ask your care team. If it is safe to put it in the trash, take the medication out of the container. Mix the medication with cat litter, dirt, coffee grounds, or other unwanted substance. Seal the mixture in a bag or container. Put it in the trash. NOTE: This sheet is a summary. It may not cover all possible information. If you have questions about this medicine, talk to your doctor, pharmacist, or health care provider.  2024 Elsevier/Gold Standard (2022-11-04 00:00:00)

## 2023-06-21 ENCOUNTER — Inpatient Hospital Stay: Payer: 59

## 2023-06-21 VITALS — BP 115/73 | HR 75 | Temp 97.2°F | Resp 18

## 2023-06-21 DIAGNOSIS — D509 Iron deficiency anemia, unspecified: Secondary | ICD-10-CM | POA: Diagnosis not present

## 2023-06-21 MED ORDER — SODIUM CHLORIDE 0.9 % IV SOLN
Freq: Once | INTRAVENOUS | Status: AC
Start: 2023-06-21 — End: 2023-06-21
  Filled 2023-06-21: qty 250

## 2023-06-21 MED ORDER — FERUMOXYTOL INJECTION 510 MG/17 ML
510.0000 mg | INTRAVENOUS | Status: DC
  Administered 2023-06-21: 510 mg via INTRAVENOUS
  Filled 2023-06-21: qty 510

## 2023-06-22 ENCOUNTER — Telehealth: Payer: Self-pay

## 2023-06-22 DIAGNOSIS — G4701 Insomnia due to medical condition: Secondary | ICD-10-CM

## 2023-06-22 NOTE — Telephone Encounter (Signed)
went online and submitted the prior auth - pending

## 2023-06-22 NOTE — Telephone Encounter (Signed)
pt left a message that insurance will not cover the lemborexant

## 2023-06-22 NOTE — Telephone Encounter (Signed)
called pharmacy and a prior auth is needed.

## 2023-06-22 NOTE — Telephone Encounter (Signed)
prior Berkley Harvey was denied.  Pt needs to try quviviq, eszopicolone, ramelteon, zaleplon.

## 2023-06-23 NOTE — Telephone Encounter (Signed)
pt states she will try the medication. please send to [facility].

## 2023-06-23 NOTE — Telephone Encounter (Signed)
Please check with patient if she is OK with trying Zaleplon . Can start a low dose of 5 mg . Side effects include habit forming potential, sleep complex behaviors like sleep walking , hallucination, confusion in some patients . That is why we can start a low dose and go from there.

## 2023-06-24 MED ORDER — ZALEPLON 5 MG PO CAPS: 5.0000 mg | ORAL_CAPSULE | Freq: Every evening | ORAL | 0 refills | Status: DC | PRN

## 2023-06-24 NOTE — Telephone Encounter (Signed)
I have sent zaleplon 5 mg to pharmacy.

## 2023-06-24 NOTE — Telephone Encounter (Signed)
 Pt.notified

## 2023-06-27 ENCOUNTER — Other Ambulatory Visit (INDEPENDENT_AMBULATORY_CARE_PROVIDER_SITE_OTHER): Payer: Self-pay | Admitting: Nurse Practitioner

## 2023-06-27 DIAGNOSIS — M7989 Other specified soft tissue disorders: Secondary | ICD-10-CM

## 2023-06-27 DIAGNOSIS — M25511 Pain in right shoulder: Secondary | ICD-10-CM

## 2023-06-29 ENCOUNTER — Ambulatory Visit (INDEPENDENT_AMBULATORY_CARE_PROVIDER_SITE_OTHER): Payer: 59

## 2023-06-29 ENCOUNTER — Encounter (INDEPENDENT_AMBULATORY_CARE_PROVIDER_SITE_OTHER): Payer: Self-pay | Admitting: Nurse Practitioner

## 2023-06-29 ENCOUNTER — Ambulatory Visit (INDEPENDENT_AMBULATORY_CARE_PROVIDER_SITE_OTHER): Payer: 59 | Admitting: Nurse Practitioner

## 2023-06-29 ENCOUNTER — Other Ambulatory Visit (INDEPENDENT_AMBULATORY_CARE_PROVIDER_SITE_OTHER): Payer: 59

## 2023-06-29 VITALS — BP 118/79 | HR 72 | Resp 16 | Wt 243.8 lb

## 2023-06-29 DIAGNOSIS — M25511 Pain in right shoulder: Secondary | ICD-10-CM | POA: Diagnosis not present

## 2023-06-29 DIAGNOSIS — G540 Brachial plexus disorders: Secondary | ICD-10-CM | POA: Diagnosis not present

## 2023-06-29 DIAGNOSIS — M7989 Other specified soft tissue disorders: Secondary | ICD-10-CM

## 2023-06-29 DIAGNOSIS — I10 Essential (primary) hypertension: Secondary | ICD-10-CM

## 2023-06-30 ENCOUNTER — Encounter (INDEPENDENT_AMBULATORY_CARE_PROVIDER_SITE_OTHER): Payer: Self-pay | Admitting: Nurse Practitioner

## 2023-06-30 NOTE — Progress Notes (Signed)
 Subjective:    Patient ID: Christine Mcneil, female    DOB: 1965/10/20, 58 y.o.   MRN: [id number] Chief Complaint  Patient presents with   Follow-up    Pt conv rue doppler. Eval for TOS    The patient is a 58 year old female who returns today for noninvasive studies regarding pain and swelling in her right upper extremity.  The swelling has been going on for about a year.  She notes that the swelling is actually worse in the morning after she wakes up and actually gets better throughout the day.  She had previously tried wearing a sleeve for compression during the bedtime but she notes that it was very painful and it actually made her arm ache in the right.  In addition to having swelling of her arm when she wakes up the arm is also numb and tingly.  She notes that this numbness and tingling actually resolved as the day goes on in addition to the improvement in the swelling.  She notes that she always sleeps on her right side.  She also has a history of rotator cuff issues and shoulder issues in her right upper extremity.  She does note some pain with activity with using her arm as well.  Since her last visit she has tried to sleep on her left side but she has not had any improvement of symptoms from this.  Today noninvasive studies show a wrist brachial index of 1.15 on the right and 1.16 on the left.  She has triphasic waveforms bilaterally through the brachial radial and ulnar vessels.  No significant arterial obstruction was detected and she has good finger waveforms bilaterally.  However her venous studies were abnormal.  The right and left axillary and brachial venous Doppler signals were asymmetrical.  Phasicity is resistant in the right arm compared to the left.  The axillary vein of the right has reduced venous diameter that is associated with an increase in velocity consistent with extrinsic compression.  This is more notable with arm elevation above the head.  There is currently no evidence of  thrombosis in the bilateral upper extremities.    Review of Systems  Neurological:  Positive for numbness.  All other systems reviewed and are negative.      Objective:   Physical Exam Vitals reviewed.  HENT:     Head: Normocephalic.  Cardiovascular:     Rate and Rhythm: Normal rate.     Pulses:          Radial pulses are 0 on the right side and 1+ on the left side.  Pulmonary:     Effort: Pulmonary effort is normal.  Skin:    General: Skin is warm and dry.  Neurological:     Mental Status: She is alert and oriented to person, place, and time.  Psychiatric:        Mood and Affect: Mood normal.        Behavior: Behavior normal.        Thought Content: Thought content normal.        Judgment: Judgment normal.     BP 118/79   Pulse 72   Resp 16   Wt 243 lb 12.8 oz (110.6 kg)   LMP  (LMP Unknown)   BMI 39.35 kg/m   Past Medical History:  Diagnosis Date   Abdominal pain    Anxiety and depression    Atypical facial pain    Bilateral occipital neuralgia    Cervical  spondylosis without myelopathy    Chronic daily headache    Chronic pain    Chronic pelvic pain in female    Chronic thoracic back pain    Depression    Diabetes mellitus without complication (HCC)    Diabetes mellitus, type II (HCC)    Dysphagia    Dysrhythmia    Fibromyalgia    Hyperlipidemia    Hypertension    Hypothyroidism    Insomnia    Iron  deficiency anemia    Left hip pain    Low back pain    Migraines    Numbness    Optic neuropathy    OSA (obstructive sleep apnea)    Polyarthralgia    Primary osteoarthritis of both hips    Prothrombin gene mutation (HCC)    Pseudotumor cerebri    Rectal bleeding    Right shoulder pain    Rotator cuff syndrome    Stroke [facility])    Thyroid disease    TIA (transient ischemic attack) 05/27/2017    Social History   Socioeconomic History   Marital status: Divorced    Spouse name: Not on file   Number of children: 1   Years of education: Not  on file   Highest education level: High school graduate  Occupational History    Comment: disablitiy  Tobacco Use   Smoking status: Never   Smokeless tobacco: Never  Vaping Use   Vaping status: Never Used  Substance and Sexual Activity   Alcohol use: No   Drug use: No   Sexual activity: Not Currently  Other Topics Concern   Not on file  Social History Narrative   Not on file   Social Drivers of Health   Financial Resource Strain: Low Risk  (07/19/2017)   Overall Financial Resource Strain (CARDIA)    Difficulty of Paying Living Expenses: Not hard at all  Food Insecurity: No Food Insecurity (07/19/2017)   Hunger Vital Sign    Worried About Running Out of Food in the Last Year: Never true    Ran Out of Food in the Last Year: Never true  Transportation Needs: No Transportation Needs (07/19/2017)   PRAPARE - Administrator, Civil Service (Medical): No    Lack of Transportation (Non-Medical): No  Physical Activity: Inactive (07/19/2017)   Exercise Vital Sign    Days of Exercise per Week: 0 days    Minutes of Exercise per Session: 0 min  Stress: Stress Concern Present (07/19/2017)   [employer] of Occupational Health - Occupational Stress Questionnaire    Feeling of Stress : Very much  Social Connections: Moderately Isolated (07/19/2017)   Social Connection and Isolation Panel [NHANES]    Frequency of Communication with Friends and Family: Once a week    Frequency of Social Gatherings with Friends and Family: Never    Attends Religious Services: More than 4 times per year    Active Member of [employer] or Organizations: No    Attends Banker Meetings: Never    Marital Status: Divorced  Catering Manager Violence: Not At Risk (07/19/2017)   Humiliation, Afraid, Rape, and Kick questionnaire    Fear of Current or Ex-Partner: No    Emotionally Abused: No    Physically Abused: No    Sexually Abused: No    Past Surgical History:  Procedure Laterality Date    ABDOMINAL HYSTERECTOMY     total   CHOLECYSTECTOMY     COLONOSCOPY WITH PROPOFOL  N/A 03/21/2018   Procedure:  COLONOSCOPY WITH PROPOFOL ;  Surgeon: Jinny Carmine, MD;  Location: [facility] ENDOSCOPY;  Service: Endoscopy;  Laterality: N/A;   ERCP N/A 08/19/2020   Procedure: ENDOSCOPIC RETROGRADE CHOLANGIOPANCREATOGRAPHY (ERCP);  Surgeon: Jinny Carmine, MD;  Location: [facility] ENDOSCOPY;  Service: Endoscopy;  Laterality: N/A;   ESOPHAGOGASTRODUODENOSCOPY (EGD) WITH PROPOFOL  N/A 03/21/2018   Procedure: ESOPHAGOGASTRODUODENOSCOPY (EGD) WITH PROPOFOL ;  Surgeon: Jinny Carmine, MD;  Location: [facility] ENDOSCOPY;  Service: Endoscopy;  Laterality: N/A;   GIVENS CAPSULE STUDY N/A 09/24/2019   Procedure: GIVENS CAPSULE STUDY;  Surgeon: Janalyn Keene NOVAK, MD;  Location: [facility] ENDOSCOPY;  Service: Endoscopy;  Laterality: N/A;   KNEE SURGERY Left    LEFT HEART CATH AND CORONARY ANGIOGRAPHY N/A 03/10/2022   Procedure: LEFT HEART CATH AND CORONARY ANGIOGRAPHY;  Surgeon: Florencio Cara BIRCH, MD;  Location: [facility] INVASIVE CV LAB;  Service: Cardiovascular;  Laterality: N/A;   ROTATOR CUFF REPAIR Right    spinal neurostimulator      Family History  Problem Relation Age of Onset   Diabetes Mother        Mother passed away on 2023/06/06.   Hyperlipidemia Mother    Hypertension Mother    COPD Mother    CVA Mother    Anemia Mother    Diabetes Father    Hyperlipidemia Father    Hypertension Father    Thyroid disease Father    Alcohol abuse Father    Peripheral vascular disease Sister    Hypertension Sister    Anxiety disorder Son    Depression Son    Bipolar disorder Son    Seizures Son     Allergies  Allergen Reactions   Amoxapine Itching   Amoxicillin Itching   Dapagliflozin Itching    Other reaction(s): Other (See Comments) Thrush & vaginal itching   Keflex [Cephalexin] Itching   Mirtazapine  Other (See Comments)    Pt states felling like my body is outside of me.       Latest Ref Rng & Units 06/08/2023     2:39 PM 02/22/2023    3:27 PM 12/15/2022    8:37 AM  CBC  WBC 4.0 - 10.5 K/uL 6.7   9.7   Hemoglobin 12.0 - 15.0 g/dL 87.1   86.8   Hematocrit 36.0 - 46.0 % 39.3   39.7   Platelets 150 - 400 K/uL 209  239  199       CMP     Component Value Date/Time   NA 132 (L) 12/15/2022 0837   NA 137 10/21/2020 1110   NA 138 03/22/2014 1659   K 4.6 12/15/2022 0837   K 3.7 03/22/2014 1659   CL 98 12/15/2022 0837   CL 104 03/22/2014 1659   CO2 24 12/15/2022 0837   CO2 23 03/22/2014 1659   GLUCOSE 251 (H) 12/15/2022 0837   GLUCOSE 156 (H) 03/22/2014 1659   BUN 12 12/15/2022 0837   BUN 7 10/21/2020 1110   BUN 7 03/22/2014 1659   CREATININE 0.83 12/15/2022 0837   CREATININE 0.98 03/22/2014 1659   CALCIUM  9.0 12/15/2022 0837   CALCIUM  9.0 03/22/2014 1659   PROT 7.5 02/22/2023 1527   PROT 8.8 (H) 03/22/2014 1659   ALBUMIN 4.8 02/22/2023 1527   ALBUMIN 4.5 03/22/2014 1659   AST 44 (H) 02/22/2023 1527   AST 34 03/22/2014 1659   ALT 52 (H) 02/22/2023 1527   ALT 37 03/22/2014 1659   ALKPHOS 127 (H) 02/22/2023 1527   ALKPHOS 147 (H) 03/22/2014 1659   BILITOT 0.4 02/22/2023 1527  BILITOT 0.3 03/22/2014 1659   EGFR 67 10/21/2020 1110   GFRNONAA >60 12/15/2022 0837   GFRNONAA >60 03/22/2014 1659   GFRNONAA >60 09/15/2013 0131     No results found.     Assessment & Plan:   1. Thoracic outlet syndrome (Primary) Today the patient's noninvasive studies are suspicious for thoracic outlet syndrome.  Based on her studies results in patient's best interest to see a specialist that treats thoracic outlet syndrome as this is not a condition we treat consistently.  I discussed with the patient as there are surgical and nonsurgical options.  Will refer the patient for further evaluation and treatment.  She will follow-up with us  as needed. - Ambulatory referral to Vascular Surgery    3. Essential hypertension Continue antihypertensive medications as already ordered, these medications have been  reviewed and there are no changes at this time.   Current Outpatient Medications on File Prior to Visit  Medication Sig Dispense Refill   ACCU-CHEK AVIVA PLUS test strip      ACCU-CHEK SOFTCLIX LANCETS lancets      Acetaminophen  (TYLENOL  ARTHRITIS EXT RELIEF PO) Take 600 mg by mouth daily.     albuterol  (VENTOLIN  HFA) 108 (90 Base) MCG/ACT inhaler Inhale 2 puffs into the lungs every 6 (six) hours as needed for wheezing or shortness of breath. 1 g 0   atorvastatin  (LIPITOR) 80 MG tablet Take 80 mg by mouth daily at 6 PM.     Blood Glucose Monitoring Suppl (ACCU-CHEK AVIVA PLUS) w/Device KIT      brexpiprazole  (REXULTI ) 0.25 MG TABS tablet Take 1 tablet (0.25 mg total) by mouth daily. Take along with 0.5 mg daily , total of 0.75 mg daily. 90 tablet 1   Brexpiprazole  (REXULTI ) 0.5 MG TABS Take 1 tablet (0.5 mg total) by mouth daily. Take along with 0.25 mg , total of 0.75 mg daily 90 tablet 1   Carboxymethylcellulose Sod PF 0.5 % SOLN Apply 1 drop to eye daily as needed.     Cholecalciferol  (VITAMIN D3) 1000 units CAPS Take 1 capsule by mouth daily.     clobetasol ointment (TEMOVATE) 0.05 % Apply topically.     Continuous Blood Gluc Receiver (DEXCOM G6 RECEIVER) DEVI Use to monitor blood sugar. NDC [phone]     Continuous Blood Gluc Sensor (DEXCOM G6 SENSOR) MISC Use to monitor blood sugar.  Replace every 10 days. NDC [phone].     Continuous Blood Gluc Transmit (DEXCOM G6 TRANSMITTER) MISC Use to monitor blood sugar.  Replace every 3 months. NDC [phone].     cyclobenzaprine (FLEXERIL) 5 MG tablet Take 1 tablet by mouth 3 (three) times daily as needed.     dicyclomine  (BENTYL ) 20 MG tablet Take 1 tablet (20 mg total) by mouth 3 (three) times daily before meals. 90 tablet 5   DILT-XR 240 MG 24 hr capsule Take 240 mg by mouth daily.     esomeprazole (NEXIUM) 40 MG capsule Take 40 mg by mouth daily.     ezetimibe (ZETIA) 10 MG tablet Take 10 mg by mouth at bedtime.     famotidine   (PEPCID ) 40 MG tablet Take 40 mg by mouth at bedtime.     ferrous sulfate 325 (65 FE) MG tablet Take by mouth. Take 325 mg daily with breakfast     fluticasone  (FLONASE ) 50 MCG/ACT nasal spray Place 2 sprays into both nostrils as needed.      furosemide (LASIX) 40 MG tablet TAKE (1) TABLET BY MOUTH EVERY DAY  gabapentin  (NEURONTIN ) 600 MG tablet Take 2 tablets (1,200 mg total) by mouth at bedtime. Prescribed by pain doc     HUMALOG KWIKPEN 200 UNIT/ML KwikPen SMARTSIG:100 Unit(s) SUB-Q Daily     hydrOXYzine  (VISTARIL ) 25 MG capsule Take 1-2 capsules (25-50 mg total) by mouth 2 (two) times daily as needed for anxiety (and sleep). 360 capsule 0   Insulin  Disposable Pump (OMNIPOD 5 G6 INTRO, GEN 5,) KIT SMARTSIG:1 SUB-Q Every 3 Days     Insulin  Pen Needle (TRUEPLUS PEN NEEDLES) 29G X MISC      levothyroxine  (SYNTHROID ) 112 MCG tablet Take 112 mcg by mouth daily.     lidocaine  4 % 1 patch daily.     magnesium oxide (MAG-OX) 400 MG tablet Take 1 tablet by mouth daily.     Melatonin 10 MG TABS Take 2 tablets by mouth at bedtime.     meloxicam (MOBIC) 15 MG tablet Take 15 mg by mouth daily.     metFORMIN  (GLUCOPHAGE -XR) 500 MG 24 hr tablet Take 500 mg by mouth 2 (two) times daily.      methocarbamol (ROBAXIN) 500 MG tablet Take 500 mg by mouth 2 (two) times daily as needed.     metoprolol  succinate (TOPROL -XL) 25 MG 24 hr tablet Take 1 tablet by mouth daily.     mometasone (ELOCON) 0.1 % cream Apply topically.     naloxone (NARCAN) nasal spray 4 mg/0.1 mL Place into the nose.     nystatin (MYCOSTATIN) 100000 UNIT/ML suspension Take 5 mLs by mouth 4 (four) times daily.     omeprazole  (PRILOSEC) 20 MG capsule TAKE (1) CAPSULE BY MOUTH EVERY DAY 30 capsule 0   ondansetron  (ZOFRAN ) 4 MG tablet Take 4 mg by mouth every 8 (eight) hours as needed.     RYBELSUS 7 MG TABS Take 1 tablet by mouth daily.     sertraline  (ZOLOFT ) 100 MG tablet Take 1.5 tablets (150 mg total) by mouth daily with breakfast.  135 tablet 0   vitamin B-12 (CYANOCOBALAMIN ) 100 MCG tablet Take 1 tablet (100 mcg total) by mouth daily. 90 tablet 1   XTAMPZA  ER 9 MG C12A PLEASE SEE ATTACHED FOR DETAILED DIRECTIONS     zaleplon  (SONATA ) 5 MG capsule Take 1 capsule (5 mg total) by mouth at bedtime as needed for sleep. 30 capsule 0   zonisamide (ZONEGRAN) 100 MG capsule Take 100 mg by mouth daily.     dabigatran  (PRADAXA ) 150 MG CAPS capsule Take 150 mg by mouth 2 (two) times daily.     nitroGLYCERIN  (NITROSTAT ) 0.4 MG SL tablet Place under the tongue.     pioglitazone (ACTOS) 15 MG tablet Take by mouth. Take 1 tablet by mouth once daily     Current Facility-Administered Medications on File Prior to Visit  Medication Dose Route Frequency Provider Last Rate Last Admin   iohexol  (OMNIPAQUE ) 300 MG/ML solution    PRN Callwood, Dwayne D, MD   60 mL at 03/10/22 1442    There are no Patient Instructions on file for this visit. No follow-ups on file.   Laaibah Wartman E Aarian Griffie, NP

## 2023-07-04 ENCOUNTER — Telehealth (INDEPENDENT_AMBULATORY_CARE_PROVIDER_SITE_OTHER): Payer: 59 | Admitting: Psychiatry

## 2023-07-04 ENCOUNTER — Encounter: Payer: Self-pay | Admitting: Psychiatry

## 2023-07-04 DIAGNOSIS — F3341 Major depressive disorder, recurrent, in partial remission: Secondary | ICD-10-CM

## 2023-07-04 DIAGNOSIS — F411 Generalized anxiety disorder: Secondary | ICD-10-CM | POA: Diagnosis not present

## 2023-07-04 DIAGNOSIS — F4310 Post-traumatic stress disorder, unspecified: Secondary | ICD-10-CM | POA: Diagnosis not present

## 2023-07-04 DIAGNOSIS — F5105 Insomnia due to other mental disorder: Secondary | ICD-10-CM

## 2023-07-04 DIAGNOSIS — Z634 Disappearance and death of family member: Secondary | ICD-10-CM

## 2023-07-04 MED ORDER — SERTRALINE HCL 100 MG PO TABS: 200.0000 mg | ORAL_TABLET | Freq: Every day | ORAL | 0 refills | Status: DC

## 2023-07-04 NOTE — Progress Notes (Signed)
 Virtual Visit via Video Note  I connected with Christine Mcneil on 07/04/23 at  2:00 PM EST by a video enabled telemedicine application and verified that I am speaking with the correct person using two identifiers.  Location Provider Location : [facility] Patient Location : Home  Participants: Patient , Provider    I discussed the limitations of evaluation and management by telemedicine and the availability of in person appointments. The patient expressed understanding and agreed to proceed.     I discussed the assessment and treatment plan with the patient. The patient was provided an opportunity to ask questions and all were answered. The patient agreed with the plan and demonstrated an understanding of the instructions.   The patient was advised to call back or seek an in-person evaluation if the symptoms worsen or if the condition fails to improve as anticipated.   [facility] MD OP Progress Note  07/04/2023 2:30 PM TESHA FOLGER  MRN:  [id number]  Chief Complaint:  Chief Complaint  Patient presents with   Follow-up   Depression   Anxiety   Medication Refill   HPI: Christine Mcneil is a 58 year old Caucasian female, divorced, lives in [place] has a history of MDD, PTSD, insomnia, GAD, history of CVA, history of prothrombin gene mutation, chronic migraine headaches, B12 deficiency, iron  deficiency was evaluated by telemedicine today.  She is experiencing sleep disturbances, often waking up at 3 or 4 AM and having difficulty returning to sleep. She attributes this to the pain and describes her mind as not wanting to 'shut down'.  She experiences significant pain in her arms, neck, and shoulders due to thoracic outlet syndrome, with the right side being more severely affected, causing swelling in the right arm. For pain management, she is currently taking Xtampza  and extra strength Tylenol . She has been in contact with her pain doctor and is scheduled for a follow-up.  Continues to struggle with grief  experiencing bouts of crying and talking to her mother, often visiting the [place]. Does report current health issues and pain as contributing to a lot of anxiety.  She does have a lot of racing thoughts often.  She is on sertraline  (Zoloft ) 150 mg daily, hydroxyzine , and Rexulti .  Agreeable to dosage increase of sertraline .  Denies thoughts of self-harm or harm to others, and no side effects from her medications.   She plans to start attending church with her sister, which is the same church her mother attended.  Visit Diagnosis:    ICD-10-CM   1. Recurrent major depressive disorder, in partial remission (HCC)  F33.41     2. PTSD (post-traumatic stress disorder)  F43.10 sertraline  (ZOLOFT ) 100 MG tablet    3. GAD (generalized anxiety disorder)  F41.1 sertraline  (ZOLOFT ) 100 MG tablet    4. Insomnia due to mental disorder  F51.05    Mood symptoms, pain    5. Bereavement  Z63.4       Past Psychiatric History: I have reviewed past psychiatric history from progress note on 07/19/2017.  Past trials of medications like Effexor, Klonopin , mirtazapine , trazodone , Belsomra , lemborexant -unable to afford  Past Medical History:  Past Medical History:  Diagnosis Date   Abdominal pain    Anxiety and depression    Atypical facial pain    Bilateral occipital neuralgia    Cervical spondylosis without myelopathy    Chronic daily headache    Chronic pain    Chronic pelvic pain in female    Chronic thoracic back pain  Depression    Diabetes mellitus without complication (HCC)    Diabetes mellitus, type II (HCC)    Dysphagia    Dysrhythmia    Fibromyalgia    Hyperlipidemia    Hypertension    Hypothyroidism    Insomnia    Iron  deficiency anemia    Left hip pain    Low back pain    Migraines    Numbness    Optic neuropathy    OSA (obstructive sleep apnea)    Polyarthralgia    Primary osteoarthritis of both hips    Prothrombin gene mutation (HCC)    Pseudotumor cerebri    Rectal  bleeding    Right shoulder pain    Rotator cuff syndrome    Stroke [facility])    Thyroid disease    TIA (transient ischemic attack) 05/27/2017    Past Surgical History:  Procedure Laterality Date   ABDOMINAL HYSTERECTOMY     total   CHOLECYSTECTOMY     COLONOSCOPY WITH PROPOFOL  N/A 03/21/2018   Procedure: COLONOSCOPY WITH PROPOFOL ;  Surgeon: Marnee Sink, MD;  Location: [facility] ENDOSCOPY;  Service: Endoscopy;  Laterality: N/A;   ERCP N/A 08/19/2020   Procedure: ENDOSCOPIC RETROGRADE CHOLANGIOPANCREATOGRAPHY (ERCP);  Surgeon: Marnee Sink, MD;  Location: [facility] ENDOSCOPY;  Service: Endoscopy;  Laterality: N/A;   ESOPHAGOGASTRODUODENOSCOPY (EGD) WITH PROPOFOL  N/A 03/21/2018   Procedure: ESOPHAGOGASTRODUODENOSCOPY (EGD) WITH PROPOFOL ;  Surgeon: Marnee Sink, MD;  Location: [facility] ENDOSCOPY;  Service: Endoscopy;  Laterality: N/A;   GIVENS CAPSULE STUDY N/A 09/24/2019   Procedure: GIVENS CAPSULE STUDY;  Surgeon: Irby Mannan, MD;  Location: [facility] ENDOSCOPY;  Service: Endoscopy;  Laterality: N/A;   KNEE SURGERY Left    LEFT HEART CATH AND CORONARY ANGIOGRAPHY N/A 03/10/2022   Procedure: LEFT HEART CATH AND CORONARY ANGIOGRAPHY;  Surgeon: Antonette Batters, MD;  Location: [facility] INVASIVE CV LAB;  Service: Cardiovascular;  Laterality: N/A;   ROTATOR CUFF REPAIR Right    spinal neurostimulator      Family Psychiatric History: I have reviewed family psychiatric history from progress note on 07/19/2017.  Family History:  Family History  Problem Relation Age of Onset   Diabetes Mother        Mother passed away on 2023/06/12.   Hyperlipidemia Mother    Hypertension Mother    COPD Mother    CVA Mother    Anemia Mother    Diabetes Father    Hyperlipidemia Father    Hypertension Father    Thyroid disease Father    Alcohol abuse Father    Peripheral vascular disease Sister    Hypertension Sister    Anxiety disorder Son    Depression Son    Bipolar disorder Son    Seizures Son     Social  History: I have reviewed social history from progress note on 07/19/2017. Social History   Socioeconomic History   Marital status: Divorced    Spouse name: Not on file   Number of children: 1   Years of education: Not on file   Highest education level: High school graduate  Occupational History    Comment: disablitiy  Tobacco Use   Smoking status: Never   Smokeless tobacco: Never  Vaping Use   Vaping status: Never Used  Substance and Sexual Activity   Alcohol use: No   Drug use: No   Sexual activity: Not Currently  Other Topics Concern   Not on file  Social History Narrative   Not on file   Social Drivers of Health  Financial Resource Strain: Low Risk  (07/19/2017)   Overall Financial Resource Strain (CARDIA)    Difficulty of Paying Living Expenses: Not hard at all  Food Insecurity: No Food Insecurity (07/19/2017)   Hunger Vital Sign    Worried About Running Out of Food in the Last Year: Never true    Ran Out of Food in the Last Year: Never true  Transportation Needs: No Transportation Needs (07/19/2017)   PRAPARE - Administrator, Civil Service (Medical): No    Lack of Transportation (Non-Medical): No  Physical Activity: Inactive (07/19/2017)   Exercise Vital Sign    Days of Exercise per Week: 0 days    Minutes of Exercise per Session: 0 min  Stress: Stress Concern Present (07/19/2017)   [employer] of Occupational Health - Occupational Stress Questionnaire    Feeling of Stress : Very much  Social Connections: Moderately Isolated (07/19/2017)   Social Connection and Isolation Panel [NHANES]    Frequency of Communication with Friends and Family: Once a week    Frequency of Social Gatherings with Friends and Family: Never    Attends Religious Services: More than 4 times per year    Active Member of [employer] or Organizations: No    Attends Banker Meetings: Never    Marital Status: Divorced    Allergies:  Allergies  Allergen Reactions    Amoxapine Itching   Amoxicillin Itching   Dapagliflozin Itching    Other reaction(s): Other (See Comments) Thrush & vaginal itching   Keflex [Cephalexin] Itching   Mirtazapine  Other (See Comments)    Pt states felling "like my body is outside of me."    Metabolic Disorder Labs: Lab Results  Component Value Date   HGBA1C 9.9 (H) 06/21/2017   MPG 237.43 06/21/2017   Lab Results  Component Value Date   PROLACTIN 4.3 (L) 05/26/2021   PROLACTIN 5.5 09/06/2017   Lab Results  Component Value Date   CHOL 161 02/22/2023   TRIG 294 (H) 02/22/2023   HDL 32 (L) 05/26/2021   CHOLHDL 4.9 (H) 05/26/2021   VLDL 39 06/21/2017   LDLCALC 84 05/26/2021   LDLCALC 174 (H) 06/21/2017   Lab Results  Component Value Date   TSH 2.890 09/06/2017   TSH 3.176 01/04/2017    Therapeutic Level Labs: No results found for: "LITHIUM" No results found for: "VALPROATE" No results found for: "CBMZ"  Current Medications: Current Outpatient Medications  Medication Sig Dispense Refill   ACCU-CHEK AVIVA PLUS test strip      ACCU-CHEK SOFTCLIX LANCETS lancets      Acetaminophen  (TYLENOL  ARTHRITIS EXT RELIEF PO) Take 600 mg by mouth daily.     albuterol  (VENTOLIN  HFA) 108 (90 Base) MCG/ACT inhaler Inhale 2 puffs into the lungs every 6 (six) hours as needed for wheezing or shortness of breath. 1 g 0   atorvastatin  (LIPITOR) 80 MG tablet Take 80 mg by mouth daily at 6 PM.     Blood Glucose Monitoring Suppl (ACCU-CHEK AVIVA PLUS) w/Device KIT      brexpiprazole  (REXULTI ) 0.25 MG TABS tablet Take 1 tablet (0.25 mg total) by mouth daily. Take along with 0.5 mg daily , total of 0.75 mg daily. 90 tablet 1   Brexpiprazole  (REXULTI ) 0.5 MG TABS Take 1 tablet (0.5 mg total) by mouth daily. Take along with 0.25 mg , total of 0.75 mg daily 90 tablet 1   Carboxymethylcellulose Sod PF 0.5 % SOLN Apply 1 drop to eye daily as needed.  Cholecalciferol  (VITAMIN D3) 1000 units CAPS Take 1 capsule by mouth daily.      clobetasol ointment (TEMOVATE) 0.05 % Apply topically.     Continuous Blood Gluc Receiver (DEXCOM G6 RECEIVER) DEVI Use to monitor blood sugar. NDC [phone]     Continuous Blood Gluc Sensor (DEXCOM G6 SENSOR) MISC Use to monitor blood sugar.  Replace every 10 days. NDC [phone].     Continuous Blood Gluc Transmit (DEXCOM G6 TRANSMITTER) MISC Use to monitor blood sugar.  Replace every 3 months. NDC [phone].     cyclobenzaprine (FLEXERIL) 5 MG tablet Take 1 tablet by mouth 3 (three) times daily as needed.     dabigatran  (PRADAXA ) 150 MG CAPS capsule Take 150 mg by mouth 2 (two) times daily.     dicyclomine  (BENTYL ) 20 MG tablet Take 1 tablet (20 mg total) by mouth 3 (three) times daily before meals. 90 tablet 5   DILT-XR 240 MG 24 hr capsule Take 240 mg by mouth daily.     esomeprazole (NEXIUM) 40 MG capsule Take 40 mg by mouth daily.     ezetimibe (ZETIA) 10 MG tablet Take 10 mg by mouth at bedtime.     famotidine  (PEPCID ) 40 MG tablet Take 40 mg by mouth at bedtime.     ferrous sulfate 325 (65 FE) MG tablet Take by mouth. Take 325 mg daily with breakfast     fluticasone  (FLONASE ) 50 MCG/ACT nasal spray Place 2 sprays into both nostrils as needed.      furosemide (LASIX) 40 MG tablet TAKE (1) TABLET BY MOUTH EVERY DAY     gabapentin  (NEURONTIN ) 600 MG tablet Take 2 tablets (1,200 mg total) by mouth at bedtime. Prescribed by pain doc     HUMALOG KWIKPEN 200 UNIT/ML KwikPen SMARTSIG:100 Unit(s) SUB-Q Daily     hydrOXYzine  (VISTARIL ) 25 MG capsule Take 1-2 capsules (25-50 mg total) by mouth 2 (two) times daily as needed for anxiety (and sleep). 360 capsule 0   Insulin  Disposable Pump (OMNIPOD 5 G6 INTRO, GEN 5,) KIT SMARTSIG:1 SUB-Q Every 3 Days     Insulin  Pen Needle (TRUEPLUS PEN NEEDLES) 29G X MISC      levothyroxine  (SYNTHROID ) 112 MCG tablet Take 112 mcg by mouth daily.     lidocaine  4 % 1 patch daily.     magnesium oxide (MAG-OX) 400 MG tablet Take 1 tablet by mouth daily.      Melatonin 10 MG TABS Take 2 tablets by mouth at bedtime.     meloxicam (MOBIC) 15 MG tablet Take 15 mg by mouth daily.     metFORMIN  (GLUCOPHAGE -XR) 500 MG 24 hr tablet Take 500 mg by mouth 2 (two) times daily.      methocarbamol (ROBAXIN) 500 MG tablet Take 500 mg by mouth 2 (two) times daily as needed.     metoprolol  succinate (TOPROL -XL) 25 MG 24 hr tablet Take 1 tablet by mouth daily.     mometasone (ELOCON) 0.1 % cream Apply topically.     naloxone (NARCAN) nasal spray 4 mg/0.1 mL Place into the nose.     nitroGLYCERIN  (NITROSTAT ) 0.4 MG SL tablet Place under the tongue.     nystatin (MYCOSTATIN) 100000 UNIT/ML suspension Take 5 mLs by mouth 4 (four) times daily.     omeprazole  (PRILOSEC) 20 MG capsule TAKE (1) CAPSULE BY MOUTH EVERY DAY 30 capsule 0   ondansetron  (ZOFRAN ) 4 MG tablet Take 4 mg by mouth every 8 (eight) hours as needed.  pioglitazone (ACTOS) 15 MG tablet Take by mouth. Take 1 tablet by mouth once daily     RYBELSUS 7 MG TABS Take 1 tablet by mouth daily.     sertraline  (ZOLOFT ) 100 MG tablet Take 2 tablets (200 mg total) by mouth daily with breakfast. 180 tablet 0   vitamin B-12 (CYANOCOBALAMIN ) 100 MCG tablet Take 1 tablet (100 mcg total) by mouth daily. 90 tablet 1   XTAMPZA  ER 9 MG C12A PLEASE SEE ATTACHED FOR DETAILED DIRECTIONS     zaleplon  (SONATA ) 5 MG capsule Take 1 capsule (5 mg total) by mouth at bedtime as needed for sleep. 30 capsule 0   zonisamide (ZONEGRAN) 100 MG capsule Take 100 mg by mouth daily.     No current facility-administered medications for this visit.   Facility-Administered Medications Ordered in Other Visits  Medication Dose Route Frequency Provider Last Rate Last Admin   iohexol  (OMNIPAQUE ) 300 MG/ML solution    PRN Callwood, Dwayne D, MD   60 mL at 03/10/22 1442     Musculoskeletal: Strength & Muscle Tone:  UTA Gait & Station:  Seated Patient leans: N/A  Psychiatric Specialty Exam: Review of Systems  Psychiatric/Behavioral:   Positive for dysphoric mood and sleep disturbance.        Grieving    There were no vitals taken for this visit.There is no height or weight on file to calculate BMI.  General Appearance: Casual  Eye Contact:  Fair  Speech:  Clear and Coherent  Volume:  Normal  Mood:  Dysphoric, anxious, grieving  Affect:  Appropriate  Thought Process:  Goal Directed and Descriptions of Associations: Intact  Orientation:  Full (Time, Place, and Person)  Thought Content: Logical   Suicidal Thoughts:  No  Homicidal Thoughts:  No  Memory:  Immediate;   Fair Recent;   Fair Remote;   Fair  Judgement:  Fair  Insight:  Fair  Psychomotor Activity:  Normal  Concentration:  Concentration: Fair and Attention Span: Fair  Recall:  [employer] of Knowledge: Fair  Language: Fair  Akathisia:  No  Handed:  Right  AIMS (if indicated): not done  Assets:  Desire for Improvement Housing Social Support  ADL's:  Intact  Cognition: WNL  Sleep:   improving ,although restless likely due to pain   Screenings: AIMS    Flowsheet Row Office Visit from 05/09/2023 in [facility] Office Visit from 03/23/2023 in [facility] Office Visit from 02/21/2023 in [facility] Office Visit from 01/26/2023 in [facility] Office Visit from 01/07/2023 in [facility]  AIMS Total Score 0 0 0 0 0      GAD-7    Flowsheet Row Office Visit from 05/09/2023 in [facility] Office Visit from 03/23/2023 in [facility] Office Visit from 02/21/2023 in [facility] Office Visit from 01/26/2023 in [facility] Office Visit from 01/07/2023 in [facility]   Total GAD-7 Score 11 11 19 18 13       PHQ2-9    Flowsheet Row Office Visit from 05/09/2023 in [facility] Office Visit from 03/23/2023 in [facility] Office Visit from 02/21/2023 in [facility] Office Visit from 01/26/2023 in [facility] Office Visit  from 01/07/2023 in [facility]  PHQ-2 Total Score 3 5 6 6 4   PHQ-9 Total Score 11 12 15 14 11       Flowsheet Row Video Visit from 07/04/2023 in [facility] Video Visit from 06/15/2023 in [facility] Office Visit from 05/09/2023 in [facility]  C-SSRS RISK CATEGORY Moderate Risk Moderate Risk Moderate Risk        Assessment and Plan: Christine Mcneil is a 57 year old Caucasian female who has a history of MDD, PTSD, multiple medical problems currently grieving the loss of her mother as well as struggles with pain which has affected her mood and sleep, discussed assessment and plan as noted below.  Major Depressive Disorder-in partial remission Reports occasional crying spells and grief related to the loss of her mother. No current suicidal ideation or thoughts of self-harm. Previous suicide attempt noted. Discussed increasing sertraline  to 200 mg daily and the importance of attending upcoming therapy session.   - Increase Sertraline  to 200 mg daily   - Continue Rexulti  0.75 mg daily. - Encouraged to establish care with therapist, has upcoming appointment. - Engage in social activities such as attending church with sister    Generalized anxiety disorder-unstable Currently reports racing thoughts/anxiety due to current pain. - Increase Sertraline  to 200 mg daily - Continue Hydroxyzine  25-50 mg twice a day as needed - Patient  referred for CBT has upcoming appointment.  Posttraumatic stress disorder-improving Denies any significant concerns other than sleep problems at this time.  Sleep problems likely multifactorial including pain. - Patient referred for CBT.  Insomnia-unstable Experiencing difficulty sleeping, likely exacerbated by pain from thoracic outlet syndrome. Reports waking up at 3-4 AM with racing thoughts. Advised to use relaxation techniques such as listening to music, focusing on breath, and adult coloring to help with sleep.   - Use relaxation techniques such as listening to music, focusing on breath, and adult coloring   - Continue zaleplon  5 mg at bedtime - Patient provided education about drug to drug interaction with medications like Xtampza  ER.  Collaboration of Care: Collaboration of Care: Referral or follow-up with counselor/therapist AEB patient encouraged to follow up with therapist.  Patient/Guardian was advised Release of Information must be obtained prior to any record release in order to collaborate their care with an outside provider. Patient/Guardian was advised if they have not already done so to contact the registration department to sign all necessary forms in order for us  to release information regarding their care.   Consent: Patient/Guardian gives verbal consent for treatment and assignment of benefits for services provided during this visit. Patient/Guardian expressed understanding and agreed to proceed.   This note was generated in part or whole with voice recognition software. Voice recognition is usually quite accurate but there are transcription errors that can and very often do occur. I apologize for any typographical errors that were not detected and corrected.    Baruc Tugwell, MD 07/04/2023, 2:30 PM

## 2023-07-05 ENCOUNTER — Encounter: Payer: Self-pay | Admitting: Oncology

## 2023-07-05 ENCOUNTER — Ambulatory Visit: Payer: 59 | Admitting: Licensed Clinical Social Worker

## 2023-07-08 ENCOUNTER — Telehealth: Payer: Self-pay | Admitting: Psychiatry

## 2023-07-29 ENCOUNTER — Other Ambulatory Visit: Payer: Self-pay | Admitting: Psychiatry

## 2023-07-29 DIAGNOSIS — G4701 Insomnia due to medical condition: Secondary | ICD-10-CM

## 2023-08-01 ENCOUNTER — Ambulatory Visit (INDEPENDENT_AMBULATORY_CARE_PROVIDER_SITE_OTHER): Payer: 59 | Admitting: Licensed Clinical Social Worker

## 2023-08-01 DIAGNOSIS — F4310 Post-traumatic stress disorder, unspecified: Secondary | ICD-10-CM

## 2023-08-01 DIAGNOSIS — Z634 Disappearance and death of family member: Secondary | ICD-10-CM

## 2023-08-01 DIAGNOSIS — F411 Generalized anxiety disorder: Secondary | ICD-10-CM

## 2023-08-01 DIAGNOSIS — F3341 Major depressive disorder, recurrent, in partial remission: Secondary | ICD-10-CM

## 2023-08-01 DIAGNOSIS — F5105 Insomnia due to other mental disorder: Secondary | ICD-10-CM
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
## 2023-08-05 ENCOUNTER — Telehealth (INDEPENDENT_AMBULATORY_CARE_PROVIDER_SITE_OTHER): Payer: Self-pay | Admitting: Psychiatry

## 2023-08-05 ENCOUNTER — Encounter: Payer: Self-pay | Admitting: Psychiatry

## 2023-08-05 DIAGNOSIS — G4733 Obstructive sleep apnea (adult) (pediatric): Secondary | ICD-10-CM

## 2023-08-05 DIAGNOSIS — Z634 Disappearance and death of family member: Secondary | ICD-10-CM

## 2023-08-05 DIAGNOSIS — F5105 Insomnia due to other mental disorder: Secondary | ICD-10-CM | POA: Diagnosis not present

## 2023-08-05 DIAGNOSIS — F411 Generalized anxiety disorder: Secondary | ICD-10-CM | POA: Diagnosis not present

## 2023-08-05 DIAGNOSIS — F4310 Post-traumatic stress disorder, unspecified: Secondary | ICD-10-CM

## 2023-08-05 DIAGNOSIS — F3341 Major depressive disorder, recurrent, in partial remission: Secondary | ICD-10-CM

## 2023-08-05 NOTE — Progress Notes (Signed)
 Virtual Visit via Video Note  I connected with Christine Mcneil on 08/05/23 at 10:20 AM EDT by a video enabled telemedicine application and verified that I am speaking with the correct person using two identifiers.  Location Provider Location : [facility] Patient Location : Home  Participants: Patient , Partner,Provider   I discussed the limitations of evaluation and management by telemedicine and the availability of in person appointments. The patient expressed understanding and agreed to proceed.   I discussed the assessment and treatment plan with the patient. The patient was provided an opportunity to ask questions and all were answered. The patient agreed with the plan and demonstrated an understanding of the instructions.   The patient was advised to call back or seek an in-person evaluation if the symptoms worsen or if the condition fails to improve as anticipated.   BH MD OP Progress Note  08/05/2023 1:00 PM Christine Mcneil  MRN:  [id number]  Chief Complaint:  Chief Complaint  Patient presents with   Anxiety   Follow-up   Depression   Medication Refill   HPI: Christine Mcneil is a 58 year old Caucasian female, divorced, lives in [place] has a history of MDD, PTSD, GAD, insomnia, history of CVA, history of prothrombin gene mutation, chronic migraine headaches, B12 deficiency, iron deficiency was evaluated by telemedicine today.  She experiences ongoing sleep disturbances despite taking Sonata 5 mg at bedtime as well as melatonin 20 mg. She has difficulty sleeping due to racing thoughts about past events, future tasks, and pain. She also mentions snoring at night and has been previously tested for sleep apnea but does not use a CPAP machine due to discomfort. She uses a fan at night to help with sleep.  She has a history of depression, PTSD, generalized anxiety, grief, and sleep problems. She is currently taking sertraline 200 mg in the morning, Rexulti 0.75 mg, and hydroxyzine 25 to 50 mg twice  a day as needed. Her mood symptoms have been improving with this regimen. No thoughts of self-harm.  She has been diagnosed with thoracic outlet syndrome and has been prescribed an additional 5 mg of oxycodone for pain management. She is awaiting a follow-up appointment in May to discuss further management options.  Socially, she has been spending a lot of time with her sister, which she enjoys.   Visit Diagnosis:    ICD-10-CM   1. Recurrent major depressive disorder, in partial remission (HCC)  F33.41     2. PTSD (post-traumatic stress disorder)  F43.10     3. GAD (generalized anxiety disorder)  F41.1     4. Insomnia due to mental disorder  F51.05    Mood symptoms, history of sleep apnea uncorrected, pain    5. Bereavement  Z63.4     6. OSA (obstructive sleep apnea)  G47.33 Ambulatory referral to Pulmonology      Past Psychiatric History: I have reviewed past psychiatric history from progress note on 07/19/2017.  Past trials of medications like Effexor, Klonopin, mirtazapine, trazodone, Belsomra, lemborexant-unable to afford  Past Medical History:  Past Medical History:  Diagnosis Date   Abdominal pain    Anxiety and depression    Atypical facial pain    Bilateral occipital neuralgia    Cervical spondylosis without myelopathy    Chronic daily headache    Chronic pain    Chronic pelvic pain in female    Chronic thoracic back pain    Depression    Diabetes mellitus without complication (HCC)  Diabetes mellitus, type II (HCC)    Dysphagia    Dysrhythmia    Fibromyalgia    Hyperlipidemia    Hypertension    Hypothyroidism    Insomnia    Iron deficiency anemia    Left hip pain    Low back pain    Migraines    Numbness    Optic neuropathy    OSA (obstructive sleep apnea)    Polyarthralgia    Primary osteoarthritis of both hips    Prothrombin gene mutation (HCC)    Pseudotumor cerebri    Rectal bleeding    Right shoulder pain    Rotator cuff syndrome    Stroke  [facility])    Thyroid disease    TIA (transient ischemic attack) 05/27/2017    Past Surgical History:  Procedure Laterality Date   ABDOMINAL HYSTERECTOMY     total   CHOLECYSTECTOMY     COLONOSCOPY WITH PROPOFOL N/A 03/21/2018   Procedure: COLONOSCOPY WITH PROPOFOL;  Surgeon: Midge Minium, MD;  Location: [facility] ENDOSCOPY;  Service: Endoscopy;  Laterality: N/A;   ERCP N/A 08/19/2020   Procedure: ENDOSCOPIC RETROGRADE CHOLANGIOPANCREATOGRAPHY (ERCP);  Surgeon: Midge Minium, MD;  Location: [facility] ENDOSCOPY;  Service: Endoscopy;  Laterality: N/A;   ESOPHAGOGASTRODUODENOSCOPY (EGD) WITH PROPOFOL N/A 03/21/2018   Procedure: ESOPHAGOGASTRODUODENOSCOPY (EGD) WITH PROPOFOL;  Surgeon: Midge Minium, MD;  Location: [facility] ENDOSCOPY;  Service: Endoscopy;  Laterality: N/A;   GIVENS CAPSULE STUDY N/A 09/24/2019   Procedure: GIVENS CAPSULE STUDY;  Surgeon: Pasty Spillers, MD;  Location: [facility] ENDOSCOPY;  Service: Endoscopy;  Laterality: N/A;   KNEE SURGERY Left    LEFT HEART CATH AND CORONARY ANGIOGRAPHY N/A 03/10/2022   Procedure: LEFT HEART CATH AND CORONARY ANGIOGRAPHY;  Surgeon: Alwyn Pea, MD;  Location: [facility] INVASIVE CV LAB;  Service: Cardiovascular;  Laterality: N/A;   ROTATOR CUFF REPAIR Right    spinal neurostimulator      Family Psychiatric History: I have reviewed family psychiatric history from progress note on 07/19/2017.  Family History:  Family History  Problem Relation Age of Onset   Diabetes Mother        Mother passed away on 06-08-2023.   Hyperlipidemia Mother    Hypertension Mother    COPD Mother    CVA Mother    Anemia Mother    Diabetes Father    Hyperlipidemia Father    Hypertension Father    Thyroid disease Father    Alcohol abuse Father    Peripheral vascular disease Sister    Hypertension Sister    Anxiety disorder Son    Depression Son    Bipolar disorder Son    Seizures Son     Social History: I have reviewed social history from progress note on  07/19/2017. Social History   Socioeconomic History   Marital status: Divorced    Spouse name: Not on file   Number of children: 1   Years of education: Not on file   Highest education level: High school graduate  Occupational History    Comment: disablitiy  Tobacco Use   Smoking status: Never   Smokeless tobacco: Never  Vaping Use   Vaping status: Never Used  Substance and Sexual Activity   Alcohol use: No   Drug use: No   Sexual activity: Not Currently  Other Topics Concern   Not on file  Social History Narrative   Not on file   Social Drivers of Health   Financial Resource Strain: Low Risk  (07/06/2023)   Received  from [facility]   Overall Financial Resource Strain (CARDIA)    Difficulty of Paying Living Expenses: Not hard at all  Food Insecurity: No Food Insecurity (07/06/2023)   Received from [facility]   Hunger Vital Sign    Worried About Running Out of Food in the Last Year: Never true    Ran Out of Food in the Last Year: Never true  Transportation Needs: No Transportation Needs (07/06/2023)   Received from [facility] - Transportation    In the past 12 months, has lack of transportation kept you from medical appointments or from getting medications?: No    Lack of Transportation (Non-Medical): No  Physical Activity: Inactive (07/19/2017)   Exercise Vital Sign    Days of Exercise per Week: 0 days    Minutes of Exercise per Session: 0 min  Stress: Stress Concern Present (07/19/2017)   [employer] of Occupational Health - Occupational Stress Questionnaire    Feeling of Stress : Very much  Social Connections: Moderately Isolated (07/19/2017)   Social Connection and Isolation Panel [NHANES]    Frequency of Communication with Friends and Family: Once a week    Frequency of Social Gatherings with Friends and Family: Never    Attends Religious Services: More than 4 times per year    Active Member of  [employer] or Organizations: No    Attends Banker Meetings: Never    Marital Status: Divorced    Allergies:  Allergies  Allergen Reactions   Amoxapine Itching   Amoxicillin Itching   Dapagliflozin Itching    Other reaction(s): Other (See Comments) Thrush & vaginal itching   Keflex [Cephalexin] Itching   Mirtazapine Other (See Comments)    Pt states felling "like my body is outside of me."    Metabolic Disorder Labs: Lab Results  Component Value Date   HGBA1C 9.9 (H) 06/21/2017   MPG 237.43 06/21/2017   Lab Results  Component Value Date   PROLACTIN 4.3 (L) 05/26/2021   PROLACTIN 5.5 09/06/2017   Lab Results  Component Value Date   CHOL 161 02/22/2023   TRIG 294 (H) 02/22/2023   HDL 32 (L) 05/26/2021   CHOLHDL 4.9 (H) 05/26/2021   VLDL 39 06/21/2017   LDLCALC 84 05/26/2021   LDLCALC 174 (H) 06/21/2017   Lab Results  Component Value Date   TSH 2.890 09/06/2017   TSH 3.176 01/04/2017    Therapeutic Level Labs: No results found for: "LITHIUM" No results found for: "VALPROATE" No results found for: "CBMZ"  Current Medications: Current Outpatient Medications  Medication Sig Dispense Refill   ACCU-CHEK AVIVA PLUS test strip      ACCU-CHEK SOFTCLIX LANCETS lancets      Acetaminophen (TYLENOL ARTHRITIS EXT RELIEF PO) Take 600 mg by mouth daily.     albuterol (VENTOLIN HFA) 108 (90 Base) MCG/ACT inhaler Inhale 2 puffs into the lungs every 6 (six) hours as needed for wheezing or shortness of breath. 1 g 0   atorvastatin (LIPITOR) 80 MG tablet Take 80 mg by mouth daily at 6 PM.     Blood Glucose Monitoring Suppl (ACCU-CHEK AVIVA PLUS) w/Device KIT      brexpiprazole (REXULTI) 0.25 MG TABS tablet Take 1 tablet (0.25 mg total) by mouth daily. Take along with 0.5 mg daily , total of 0.75 mg daily. 90 tablet 1   Brexpiprazole (REXULTI) 0.5 MG TABS Take 1 tablet (0.5 mg total) by mouth daily. Take along with  0.25 mg , total of 0.75 mg daily 90 tablet 1    Carboxymethylcellulose Sod PF 0.5 % SOLN Apply 1 drop to eye daily as needed.     Cholecalciferol (VITAMIN D3) 1000 units CAPS Take 1 capsule by mouth daily.     clobetasol ointment (TEMOVATE) 0.05 % Apply topically.     Continuous Blood Gluc Receiver (DEXCOM G6 RECEIVER) DEVI Use to monitor blood sugar. NDC [phone]     Continuous Blood Gluc Sensor (DEXCOM G6 SENSOR) MISC Use to monitor blood sugar.  Replace every 10 days. NDC [phone].     Continuous Blood Gluc Transmit (DEXCOM G6 TRANSMITTER) MISC Use to monitor blood sugar.  Replace every 3 months. NDC [phone].     cyclobenzaprine (FLEXERIL) 5 MG tablet Take 1 tablet by mouth 3 (three) times daily as needed.     dabigatran (PRADAXA) 150 MG CAPS capsule Take 150 mg by mouth 2 (two) times daily.     dicyclomine (BENTYL) 20 MG tablet Take 1 tablet (20 mg total) by mouth 3 (three) times daily before meals. 90 tablet 5   DILT-XR 240 MG 24 hr capsule Take 240 mg by mouth daily.     esomeprazole (NEXIUM) 40 MG capsule Take 40 mg by mouth daily.     ezetimibe (ZETIA) 10 MG tablet Take 10 mg by mouth at bedtime.     famotidine (PEPCID) 40 MG tablet Take 40 mg by mouth at bedtime.     ferrous sulfate 325 (65 FE) MG tablet Take by mouth. Take 325 mg daily with breakfast     fluticasone (FLONASE) 50 MCG/ACT nasal spray Place 2 sprays into both nostrils as needed.      furosemide (LASIX) 40 MG tablet TAKE (1) TABLET BY MOUTH EVERY DAY     gabapentin (NEURONTIN) 600 MG tablet Take 2 tablets (1,200 mg total) by mouth at bedtime. Prescribed by pain doc     HUMALOG KWIKPEN 200 UNIT/ML KwikPen SMARTSIG:100 Unit(s) SUB-Q Daily     hydrOXYzine (VISTARIL) 25 MG capsule Take 1-2 capsules (25-50 mg total) by mouth 2 (two) times daily as needed for anxiety (and sleep). 360 capsule 0   Insulin Disposable Pump (OMNIPOD 5 G6 INTRO, GEN 5,) KIT SMARTSIG:1 SUB-Q Every 3 Days     Insulin Pen Needle (TRUEPLUS PEN NEEDLES) 29G X MISC      levothyroxine  (SYNTHROID) 112 MCG tablet Take 112 mcg by mouth daily.     lidocaine 4 % 1 patch daily.     magnesium oxide (MAG-OX) 400 MG tablet Take 1 tablet by mouth daily.     Melatonin 10 MG TABS Take 2 tablets by mouth at bedtime.     meloxicam (MOBIC) 15 MG tablet Take 15 mg by mouth daily.     metFORMIN (GLUCOPHAGE-XR) 500 MG 24 hr tablet Take 500 mg by mouth 2 (two) times daily.      methocarbamol (ROBAXIN) 500 MG tablet Take 500 mg by mouth 2 (two) times daily as needed.     metoprolol succinate (TOPROL-XL) 25 MG 24 hr tablet Take 1 tablet by mouth daily.     mometasone (ELOCON) 0.1 % cream Apply topically.     naloxone (NARCAN) nasal spray 4 mg/0.1 mL Place into the nose.     nitroGLYCERIN (NITROSTAT) 0.4 MG SL tablet Place under the tongue.     nystatin (MYCOSTATIN) 100000 UNIT/ML suspension Take 5 mLs by mouth 4 (four) times daily.     omeprazole (PRILOSEC) 20 MG capsule TAKE (  1) CAPSULE BY MOUTH EVERY DAY 30 capsule 0   ondansetron (ZOFRAN) 4 MG tablet Take 4 mg by mouth every 8 (eight) hours as needed.     pioglitazone (ACTOS) 15 MG tablet Take by mouth. Take 1 tablet by mouth once daily     RYBELSUS 7 MG TABS Take 1 tablet by mouth daily.     sertraline (ZOLOFT) 100 MG tablet Take 2 tablets (200 mg total) by mouth daily with breakfast. 180 tablet 0   vitamin B-12 (CYANOCOBALAMIN) 100 MCG tablet Take 1 tablet (100 mcg total) by mouth daily. 90 tablet 1   XTAMPZA ER 9 MG C12A PLEASE SEE ATTACHED FOR DETAILED DIRECTIONS     zaleplon (SONATA) 5 MG capsule Take 1 capsule (5 mg total) by mouth at bedtime as needed for sleep. 30 capsule 3   zonisamide (ZONEGRAN) 100 MG capsule Take 100 mg by mouth daily.     No current facility-administered medications for this visit.   Facility-Administered Medications Ordered in Other Visits  Medication Dose Route Frequency Provider Last Rate Last Admin   iohexol (OMNIPAQUE) 300 MG/ML solution    PRN Callwood, Dwayne D, MD   60 mL at 03/10/22 1442      Musculoskeletal: Strength & Muscle Tone:  UTA Gait & Station:  Seated Patient leans: N/A  Psychiatric Specialty Exam: Review of Systems  Psychiatric/Behavioral:  Positive for sleep disturbance. The patient is nervous/anxious.     There were no vitals taken for this visit.There is no height or weight on file to calculate BMI.  General Appearance: Casual  Eye Contact:  Fair  Speech:  Normal Rate  Volume:  Normal  Mood:  Anxious  Affect:  Congruent  Thought Process:  Goal Directed and Descriptions of Associations: Intact  Orientation:  Full (Time, Place, and Person)  Thought Content: Logical   Suicidal Thoughts:  No  Homicidal Thoughts:  No  Memory:  Immediate;   Fair Recent;   Fair Remote;   Fair  Judgement:  Fair  Insight:  Fair  Psychomotor Activity:  Normal  Concentration:  Concentration: Fair and Attention Span: Fair  Recall:  [employer] of Knowledge: Fair  Language: Fair  Akathisia:  No  Handed:  Right  AIMS (if indicated): not done  Assets:  Desire for Improvement Housing Social Support Transportation  ADL's:  Intact  Cognition: WNL  Sleep:  Poor   Screenings: Geneticist, molecular Office Visit from 05/09/2023 in [facility] Office Visit from 03/23/2023 in [facility] Office Visit from 02/21/2023 in [facility] Office Visit from 01/26/2023 in [facility] Office Visit from 01/07/2023 in [facility]  AIMS Total Score 0 0 0 0 0      GAD-7    Flowsheet Row Counselor from 08/01/2023 in [facility] Office Visit from 05/09/2023 in [facility] Office Visit from 03/23/2023 in [facility] Office Visit from 02/21/2023 in [facility] Office Visit from 01/26/2023 in [facility]  Total GAD-7 Score 10 11 11 19 18       PHQ2-9    Flowsheet Row Counselor from 08/01/2023 in [facility] Office Visit from 05/09/2023 in [facility] Office Visit from 03/23/2023 in [facility] Visit from 02/21/2023 in [facility] Office Visit from 01/26/2023 in [facility]  PHQ-2 Total Score 4 3 5 6 6   PHQ-9 Total Score 10 11 12 15 14       Flowsheet Row Video Visit from 08/05/2023 in [facility] Counselor from 08/01/2023 in [facility] Video Visit from 07/04/2023 in [facility]  C-SSRS RISK CATEGORY Moderate Risk Error: Q7 should not be populated when Q6 is No Moderate Risk        Assessment and Plan: Christine Mcneil is a 58 year old Caucasian female, has a history of MDD, PTSD, bereavement, uncorrected sleep apnea multiple medical problems presents for a follow-up appointment, discussed assessment and plan as noted below.  Insomnia-unstable Sonata is ineffective, with racing thoughts at night related to past events, future tasks, and pain. Concerns about exacerbation of sleep apnea and risk of respiratory failure due to sedative medications with opioids. Advised against increasing sleep medication dosage due to these risks. Discussed non-pharmacological interventions and the importance of addressing sleep apnea to prevent long-term complications. - Recommend using a calm app, soft music, muscle relaxation, and breathing techniques before bedtime. - Advise against staying in bed if not sleeping; suggest sitting in a recliner with dim light and engaging in  non-stimulating activities like reading or coloring. - Discuss the potential benefits of Melatonin extended release or Melatonin combinations with Magnesium and Ashwagandha. - Advise against using high doses of Melatonin if ineffective. - Refer to a pulmonologist at [facility] for evaluation of sleep apnea. - Could hold the Sonata if not helpful.  Sleep Apnea-unstable Does not use CPAP due to discomfort. Emphasized the importance of addressing sleep apnea to prevent long-term complications such as memory changes, dementia, heart attack, stroke, and hypertension. Discussed various CPAP options and the need for reevaluation by a sleep specialist. - Refer to a pulmonologist at [facility] for evaluation of sleep apnea.  Depression in partial remission Improving on current medication regimen. Reports no thoughts of self-harm and is engaging in enjoyable activities with her sister. - Continue Sertraline 200 mg daily - Continue Rexulti 0.75 mg daily  Post-Traumatic Stress Disorder (PTSD)-improving PTSD may contribute to racing thoughts and sleep disturbances. - Patient referred for CBT, patient to continue follow-up with Ms. Perkins.  Generalized Anxiety Disorder-unstable Generalized anxiety may contribute to racing thoughts and sleep disturbances. - Patient referred for CBT - Continue Sertraline 200 mg daily - Continue Hydroxyzine 25-50 mg twice a day as needed  Bereavement-improving Patient currently coping better with grief.  Making use of her social support system including spending time with her sister. - Continue to provide grief counseling.   Collaboration of Care: Collaboration of Care: Referral or follow-up with counselor/therapist AEB encouraged to continue CBT  Follow-up Scheduled for a follow-up appointment in eight weeks. - Schedule follow-up appointment in eight weeks.  Patient/Guardian was advised Release of Information must be obtained prior to any record release in  order to collaborate their care with an outside provider. Patient/Guardian was advised if they have not already done so to contact the registration department to sign all necessary forms in order for [name] to release information regarding their care.   Consent: Patient/Guardian gives verbal consent for treatment and assignment of benefits for services provided during this visit. Patient/Guardian expressed understanding and agreed to proceed.  Discussed the use of a AI scribe software for clinical note  transcription with the patient, who gave verbal consent to proceed.  This note was generated in part or whole with voice recognition software. Voice recognition is usually quite accurate but there are transcription errors that can and very often do occur. I apologize for any typographical errors that were not detected and corrected.     Jomarie Longs, MD 08/05/2023, 1:00 PM

## 2023-08-11 ENCOUNTER — Emergency Department

## 2023-08-11 ENCOUNTER — Other Ambulatory Visit: Payer: Self-pay

## 2023-08-11 ENCOUNTER — Emergency Department
Admission: EM | Admit: 2023-08-11 | Discharge: 2023-08-11 | Disposition: A | Discharge status: Home / Self Care | Attending: Emergency Medicine | Admitting: Emergency Medicine

## 2023-08-11 DIAGNOSIS — Z79899 Other long term (current) drug therapy: Secondary | ICD-10-CM | POA: Insufficient documentation

## 2023-08-11 DIAGNOSIS — U071 COVID-19: Secondary | ICD-10-CM | POA: Insufficient documentation

## 2023-08-11 DIAGNOSIS — E119 Type 2 diabetes mellitus without complications: Secondary | ICD-10-CM | POA: Insufficient documentation

## 2023-08-11 DIAGNOSIS — E039 Hypothyroidism, unspecified: Secondary | ICD-10-CM | POA: Insufficient documentation

## 2023-08-11 DIAGNOSIS — J340 Abscess, furuncle and carbuncle of nose: Secondary | ICD-10-CM | POA: Insufficient documentation

## 2023-08-11 DIAGNOSIS — Z794 Long term (current) use of insulin: Secondary | ICD-10-CM | POA: Insufficient documentation

## 2023-08-11 DIAGNOSIS — I10 Essential (primary) hypertension: Secondary | ICD-10-CM | POA: Insufficient documentation

## 2023-08-11 DIAGNOSIS — R059 Cough, unspecified: Secondary | ICD-10-CM | POA: Diagnosis present

## 2023-08-11 LAB — RESP PANEL BY RT-PCR (RSV, FLU A&B, COVID)  RVPGX2
Influenza A by PCR: NEGATIVE
Influenza B by PCR: NEGATIVE
Resp Syncytial Virus by PCR: NEGATIVE
SARS Coronavirus 2 by RT PCR: POSITIVE — AB

## 2023-08-11 MED ORDER — DOXYCYCLINE HYCLATE 100 MG PO TABS: 100.0000 mg | ORAL_TABLET | Freq: Two times a day (BID) | ORAL | 0 refills | Status: DC

## 2023-08-11 MED ORDER — KETOROLAC TROMETHAMINE 30 MG/ML IJ SOLN
60.0000 mg | Freq: Once | INTRAMUSCULAR | Status: AC
  Administered 2023-08-11: 60 mg via INTRAMUSCULAR
  Filled 2023-08-11: qty 2

## 2023-08-11 MED ORDER — FLUTICASONE PROPIONATE 50 MCG/ACT NA SUSP: 1.0000 | Freq: Every day | NASAL | 0 refills | Status: AC

## 2023-08-11 MED ORDER — DOXYCYCLINE HYCLATE 100 MG PO TABS
100.0000 mg | ORAL_TABLET | Freq: Once | ORAL | Status: AC
  Administered 2023-08-11: 100 mg via ORAL
  Filled 2023-08-11: qty 1

## 2023-08-11 MED ORDER — MOLNUPIRAVIR EUA 200MG CAPSULE: 4.0000 | ORAL_CAPSULE | Freq: Two times a day (BID) | ORAL | 0 refills | Status: AC

## 2023-08-11 NOTE — ED Provider Notes (Signed)
 [facility] Provider Note    Event Date/Time   First MD Initiated Contact with Patient 08/11/23 [phone]     (approximate)   History   Cough, Nasal Congestion, and Facial Pain   HPI  Christine Mcneil is a 58 y.o. female with history of obesity, hypertension, hyperlipidemia, diabetes, hypothyroidism, chronic pain, fibromyalgia followed by pain management, thoracic outlet syndrome, iron deficiency anemia who presents to the emergency department complaints of fevers, sinus pressure, cough, congestion for 3 days.  No vomiting, diarrhea.  Taking Tylenol without relief.  Reports she also feels a bump to the right side of her external nose.  She is worried she has developed an abscess.   History provided by patient.    Past Medical History:  Diagnosis Date   Abdominal pain    Anxiety and depression    Atypical facial pain    Bilateral occipital neuralgia    Cervical spondylosis without myelopathy    Chronic daily headache    Chronic pain    Chronic pelvic pain in female    Chronic thoracic back pain    Depression    Diabetes mellitus without complication (HCC)    Diabetes mellitus, type II (HCC)    Dysphagia    Dysrhythmia    Fibromyalgia    Hyperlipidemia    Hypertension    Hypothyroidism    Insomnia    Iron deficiency anemia    Left hip pain    Low back pain    Migraines    Numbness    Optic neuropathy    OSA (obstructive sleep apnea)    Polyarthralgia    Primary osteoarthritis of both hips    Prothrombin gene mutation (HCC)    Pseudotumor cerebri    Rectal bleeding    Right shoulder pain    Rotator cuff syndrome    Stroke [facility])    Thyroid disease    TIA (transient ischemic attack) 05/27/2017    Past Surgical History:  Procedure Laterality Date   ABDOMINAL HYSTERECTOMY     total   CHOLECYSTECTOMY     COLONOSCOPY WITH PROPOFOL N/A 03/21/2018   Procedure: COLONOSCOPY WITH PROPOFOL;  Surgeon: Midge Minium, MD;  Location: [facility] ENDOSCOPY;   Service: Endoscopy;  Laterality: N/A;   ERCP N/A 08/19/2020   Procedure: ENDOSCOPIC RETROGRADE CHOLANGIOPANCREATOGRAPHY (ERCP);  Surgeon: Midge Minium, MD;  Location: [facility] ENDOSCOPY;  Service: Endoscopy;  Laterality: N/A;   ESOPHAGOGASTRODUODENOSCOPY (EGD) WITH PROPOFOL N/A 03/21/2018   Procedure: ESOPHAGOGASTRODUODENOSCOPY (EGD) WITH PROPOFOL;  Surgeon: Midge Minium, MD;  Location: [facility] ENDOSCOPY;  Service: Endoscopy;  Laterality: N/A;   GIVENS CAPSULE STUDY N/A 09/24/2019   Procedure: GIVENS CAPSULE STUDY;  Surgeon: Pasty Spillers, MD;  Location: [facility] ENDOSCOPY;  Service: Endoscopy;  Laterality: N/A;   KNEE SURGERY Left    LEFT HEART CATH AND CORONARY ANGIOGRAPHY N/A 03/10/2022   Procedure: LEFT HEART CATH AND CORONARY ANGIOGRAPHY;  Surgeon: Alwyn Pea, MD;  Location: [facility] INVASIVE CV LAB;  Service: Cardiovascular;  Laterality: N/A;   ROTATOR CUFF REPAIR Right    spinal neurostimulator      MEDICATIONS:  Prior to Admission medications   Medication Sig Start Date End Date Taking? Authorizing Provider  ACCU-CHEK AVIVA PLUS test strip  03/27/18   [provider]  ACCU-CHEK SOFTCLIX LANCETS lancets  11/07/17   [provider]  Acetaminophen (TYLENOL ARTHRITIS EXT RELIEF PO) Take 600 mg by mouth daily.    [provider]  albuterol (VENTOLIN HFA) 108 (90  Base) MCG/ACT inhaler Inhale 2 puffs into the lungs every 6 (six) hours as needed for wheezing or shortness of breath. 03/08/19   Darci Current, MD  atorvastatin (LIPITOR) 80 MG tablet Take 80 mg by mouth daily at 6 PM. 12/22/17   [provider]  Blood Glucose Monitoring Suppl (ACCU-CHEK AVIVA PLUS) w/Device KIT  11/07/17   [provider]  brexpiprazole (REXULTI) 0.25 MG TABS tablet Take 1 tablet (0.25 mg total) by mouth daily. Take along with 0.5 mg daily , total of 0.75 mg daily. 04/29/23   Jomarie Longs, MD  Brexpiprazole (REXULTI) 0.5 MG TABS Take 1 tablet (0.5 mg total) by mouth  daily. Take along with 0.25 mg , total of 0.75 mg daily 04/29/23   Jomarie Longs, MD  Carboxymethylcellulose Sod PF 0.5 % SOLN Apply 1 drop to eye daily as needed. 05/15/15   [provider]  Cholecalciferol (VITAMIN D3) 1000 units CAPS Take 1 capsule by mouth daily.    [provider]  clobetasol ointment (TEMOVATE) 0.05 % Apply topically. 06/16/21   [provider]  Continuous Blood Gluc Receiver (DEXCOM G6 RECEIVER) DEVI Use to monitor blood sugar. [facility] [address]-40 08/07/21   [provider]  Continuous Blood Gluc Sensor (DEXCOM G6 SENSOR) MISC Use to monitor blood sugar.  Replace every 10 days. [facility] [address]-29. 08/07/21   [provider]  Continuous Blood Gluc Transmit (DEXCOM G6 TRANSMITTER) MISC Use to monitor blood sugar.  Replace every 3 months. [facility] [address]-78. 08/07/21   [provider]  cyclobenzaprine (FLEXERIL) 5 MG tablet Take 1 tablet by mouth 3 (three) times daily as needed. 01/14/21   [provider]  dabigatran (PRADAXA) 150 MG CAPS capsule Take 150 mg by mouth 2 (two) times daily. 10/12/16 08/17/22  [provider]  dicyclomine (BENTYL) 20 MG tablet Take 1 tablet (20 mg total) by mouth 3 (three) times daily before meals. 02/22/23   Midge Minium, MD  DILT-XR 240 MG 24 hr capsule Take 240 mg by mouth daily. 08/17/22   [provider]  esomeprazole (NEXIUM) 40 MG capsule Take 40 mg by mouth daily.    [provider]  ezetimibe (ZETIA) 10 MG tablet Take 10 mg by mouth at bedtime.    [provider]  famotidine (PEPCID) 40 MG tablet Take 40 mg by mouth at bedtime. 10/10/19   [provider]  ferrous sulfate 325 (65 FE) MG tablet Take by mouth. Take 325 mg daily with breakfast    [provider]  fluticasone (FLONASE) 50 MCG/ACT nasal spray Place 2 sprays into both nostrils as needed.  06/25/17   [provider]  furosemide (LASIX) 40 MG tablet TAKE (1) TABLET BY MOUTH  EVERY DAY 11/17/21   [provider]  gabapentin (NEURONTIN) 600 MG tablet Take 2 tablets (1,200 mg total) by mouth at bedtime. Prescribed by pain doc 05/09/23   Jomarie Longs, MD  HUMALOG KWIKPEN 200 UNIT/ML KwikPen SMARTSIG:100 Unit(s) SUB-Q Daily 09/29/22   [provider]  hydrOXYzine (VISTARIL) 25 MG capsule Take 1-2 capsules (25-50 mg total) by mouth 2 (two) times daily as needed for anxiety (and sleep). 11/23/22   Jomarie Longs, MD  Insulin Disposable Pump (OMNIPOD 5 G6 INTRO, GEN 5,) KIT SMARTSIG:1 SUB-Q Every 3 Days    [provider]  Insulin Pen Needle (TRUEPLUS PEN NEEDLES) 29G X MISC  10/22/21   [provider]  levothyroxine (SYNTHROID) 112 MCG tablet Take 112 mcg by mouth daily. 09/09/22  [provider]  lidocaine 4 % 1 patch daily. 06/30/22   [provider]  magnesium oxide (MAG-OX) 400 MG tablet Take 1 tablet by mouth daily. 12/03/20   [provider]  Melatonin 10 MG TABS Take 2 tablets by mouth at bedtime. 08/15/19   [provider]  meloxicam (MOBIC) 15 MG tablet Take 15 mg by mouth daily. 06/09/20   [provider]  metFORMIN (GLUCOPHAGE-XR) 500 MG 24 hr tablet Take 500 mg by mouth 2 (two) times daily.  02/17/18   [provider]  methocarbamol (ROBAXIN) 500 MG tablet Take 500 mg by mouth 2 (two) times daily as needed. 06/06/23   [provider]  metoprolol succinate (TOPROL-XL) 25 MG 24 hr tablet Take 1 tablet by mouth daily. 05/13/20   [provider]  mometasone (ELOCON) 0.1 % cream Apply topically. 05/07/21   [provider]  naloxone [facility]) nasal spray 4 mg/0.1 mL Place into the nose. 01/14/21   [provider]  nitroGLYCERIN (NITROSTAT) 0.4 MG SL tablet Place under the tongue. 02/02/21 02/02/22  [provider]  nystatin (MYCOSTATIN) 100000 UNIT/ML suspension Take 5 mLs by mouth 4 (four) times daily. 09/06/19   [provider]   omeprazole (PRILOSEC) 20 MG capsule TAKE (1) CAPSULE BY MOUTH EVERY DAY 08/30/19   Melodie Bouillon B, MD  ondansetron (ZOFRAN) 4 MG tablet Take 4 mg by mouth every 8 (eight) hours as needed. 01/22/22   [provider]  pioglitazone (ACTOS) 15 MG tablet Take by mouth. Take 1 tablet by mouth once daily 11/07/17 02/21/23  [provider]  RYBELSUS 7 MG TABS Take 1 tablet by mouth daily. 02/22/22   [provider]  sertraline (ZOLOFT) 100 MG tablet Take 2 tablets (200 mg total) by mouth daily with breakfast. 07/04/23 10/02/23  Jomarie Longs, MD  vitamin B-12 (CYANOCOBALAMIN) 100 MCG tablet Take 1 tablet (100 mcg total) by mouth daily. 07/17/21   Alinda Dooms, NP  XTAMPZA ER 9 MG C12A PLEASE SEE ATTACHED FOR DETAILED DIRECTIONS 06/16/21   [provider]  zaleplon (SONATA) 5 MG capsule Take 1 capsule (5 mg total) by mouth at bedtime as needed for sleep. 07/31/23 11/28/23  Jomarie Longs, MD  zonisamide (ZONEGRAN) 100 MG capsule Take 100 mg by mouth daily. 06/02/22   [provider]    Physical Exam   Triage Vital Signs: ED Triage Vitals  Encounter Vitals Group     BP 08/11/23 0223 (!) 152/84     Systolic BP Percentile --      Diastolic BP Percentile --      Pulse Rate 08/11/23 0223 80     Resp 08/11/23 0223 16     Temp 08/11/23 0223 97.6 F (36.4 C)     Temp Source 08/11/23 0223 Oral     SpO2 08/11/23 0223 96 %     Weight 08/11/23 0221 240 lb (108.9 kg)     Height 08/11/23 0221 5\' 6"  (1.676 m)     Head Circumference --      Peak Flow --      Pain Score 08/11/23 0221 6     Pain Loc --      Pain Education --      Exclude from Growth Chart --     Most recent vital signs: Vitals:   08/11/23 0223  BP: (!) 152/84  Pulse: 80  Resp: 16  Temp: 97.6 F (36.4 C)  SpO2: 96%    CONSTITUTIONAL: Alert, responds  appropriately to questions. Well-appearing; well-nourished HEAD: Normocephalic, atraumatic EYES: Conjunctivae clear, pupils appear equal,  sclera nonicteric ENT: normal nose; moist mucous membranes, slightly tender raised bump to the right external nose without fluctuance, induration, increased redness or warmth NECK: Supple, normal ROM, no meningismus CARD: RRR; S1 and S2 appreciated RESP: Normal chest excursion without splinting or tachypnea; breath sounds clear and equal bilaterally; no wheezes, no rhonchi, no rales, no hypoxia or respiratory distress, speaking full sentences ABD/GI: Non-distended; soft, non-tender, no rebound, no guarding, no peritoneal signs BACK: The back appears normal EXT: Normal ROM in all joints; no deformity noted, no edema SKIN: Normal color for age and race; warm; no rash on exposed skin NEURO: Moves all extremities equally, normal speech PSYCH: The patient's mood and manner are appropriate.   ED Results / Procedures / Treatments   LABS: (all labs ordered are listed, but only abnormal results are displayed) Labs Reviewed  RESP PANEL BY RT-PCR (RSV, FLU A&B, COVID)  [id number] - Abnormal; Notable for the following components:      Result Value   SARS Coronavirus 2 by RT PCR POSITIVE (*)    All other components within normal limits     EKG:   RADIOLOGY: My personal review and interpretation of imaging: Chest x-ray clear.  I have personally reviewed all radiology reports.   DG Chest 2 View Result Date: 08/11/2023 CLINICAL DATA:  Cough with nasal congestion and right nostril pain x3 days. EXAM: CHEST - 2 VIEW COMPARISON:  June 30, 2022 FINDINGS: The heart size and mediastinal contours are within normal limits. There is no evidence of acute infiltrate, pleural effusion or pneumothorax. Stable spinal stimulator wire positioning is noted. Multilevel degenerative changes are seen throughout the thoracic spine. IMPRESSION: No active cardiopulmonary disease. Electronically Signed   By: Aram Candela M.D.   On: 08/11/2023 03:40     PROCEDURES:    Procedures    IMPRESSION / MDM /  ASSESSMENT AND PLAN / ED COURSE  I reviewed the triage vital signs and the nursing notes.    Patient here with symptoms of a URI, concerns for developing abscess to the outside of the right part of her nose.     DIFFERENTIAL DIAGNOSIS (includes but not limited to):   Viral URI, pneumonia, developing abscess, no sign of facial cellulitis   Patient's presentation is most consistent with acute complicated illness / injury requiring diagnostic workup.   PLAN: Patient has tested positive for COVID-19.  Negative for RSV, influenza.  Chest x-ray reviewed and interpreted by myself and the radiologist and is clear.  She does have what appears to be a very small developing abscess to the right side of her nose externally but no facial cellulitis.  This does not appear to be amendable to draining.  Will start her on doxycycline for this.  Discussed supportive care instructions.  Recommended antihistamines, nasal steroids.  She reports she takes 26 medications and has multiple medical problems.  I feel that she will have many contraindications for Paxlovid but given her medical history she should be on an antiviral.  Will start her on molnupiravir.   MEDICATIONS GIVEN IN ED: Medications  ketorolac (TORADOL) 30 MG/ML injection 60 mg (60 mg Intramuscular Given 08/11/23 0434)  doxycycline (VIBRA-TABS) tablet 100 mg (100 mg Oral Given 08/11/23 0433)     ED COURSE:  At this time, I do not feel there is any life-threatening condition present. I reviewed all nursing notes, vitals, pertinent previous records.  All lab  and urine results, EKGs, imaging ordered have been independently reviewed and interpreted by myself.  I reviewed all available radiology reports from any imaging ordered this visit.  Based on my assessment, I feel the patient is safe to be discharged home without further emergent workup and can continue workup as an outpatient as needed. Discussed all findings, treatment plan as well as usual and  customary return precautions.  They verbalize understanding and are comfortable with this plan.  Outpatient follow-up has been provided as needed.  All questions have been answered.    CONSULTS:  none   OUTSIDE RECORDS REVIEWED: Reviewed most recent cardiology, pain management notes.       FINAL CLINICAL IMPRESSION(S) / ED DIAGNOSES   Final diagnoses:  COVID-19  Abscess of external nose     Rx / DC Orders   ED Discharge Orders          Ordered    fluticasone (FLONASE) 50 MCG/ACT nasal spray  Daily        08/11/23 0436    molnupiravir EUA (LAGEVRIO) 200 mg CAPS capsule  2 times daily        08/11/23 0436    doxycycline (VIBRA-TABS) 100 MG tablet  2 times daily        08/11/23 0436             Note:  This document was prepared using Dragon voice recognition software and may include unintentional dictation errors.   Pasco Marchitto, Layla Maw, DO 08/11/23 [phone]

## 2023-08-11 NOTE — ED Triage Notes (Signed)
 Pt to ED via POV c/o cough, congestion, and nasal pain x3 days. Pt reports feeling pressure in right nostril, feels like she has an abscess nose.

## 2023-08-11 NOTE — Discharge Instructions (Addendum)
 You may take over-the-counter Tylenol 1000 mg every 6 hours as needed for fever, pain.  You may use over-the-counter Flonase, Zyrtec to help with congestion.  You may also use Afrin twice daily for nasal congestion.  Please do not use this for more than 3 days in a row as it can cause worsening congestion.  Nasal saline over-the-counter can also help with congestion.  You may take over-the-counter guaifenesin, dextromethorphan as needed for cough.  Honey has also been proven to help with cough.  Your flu, RSV test today were negative.  You have tested positive for COVID 19.

## 2023-08-26 ENCOUNTER — Other Ambulatory Visit: Payer: Self-pay | Admitting: Gastroenterology

## 2023-08-26 DIAGNOSIS — R112 Nausea with vomiting, unspecified: Secondary | ICD-10-CM

## 2023-08-30 ENCOUNTER — Encounter: Payer: Self-pay | Admitting: Oncology

## 2023-09-21 ENCOUNTER — Ambulatory Visit (INDEPENDENT_AMBULATORY_CARE_PROVIDER_SITE_OTHER): Admitting: Licensed Clinical Social Worker

## 2023-09-21 DIAGNOSIS — Z91199 Patient's noncompliance with other medical treatment and regimen due to unspecified reason: Secondary | ICD-10-CM

## 2023-09-21 NOTE — Progress Notes (Signed)
 Clinician attempted session via face-to-face, but Christine Mcneil did not appear for her session.

## 2023-09-30 ENCOUNTER — Other Ambulatory Visit: Payer: Self-pay | Admitting: Psychiatry

## 2023-09-30 DIAGNOSIS — F411 Generalized anxiety disorder: Secondary | ICD-10-CM

## 2023-09-30 DIAGNOSIS — F4310 Post-traumatic stress disorder, unspecified: Secondary | ICD-10-CM

## 2023-10-03 ENCOUNTER — Encounter: Payer: Self-pay | Admitting: Oncology

## 2023-10-03 ENCOUNTER — Ambulatory Visit: Admitting: Licensed Clinical Social Worker

## 2023-10-07 ENCOUNTER — Emergency Department
Admission: EM | Admit: 2023-10-07 | Discharge: 2023-10-07 | Disposition: A | Discharge status: Home / Self Care | Attending: Emergency Medicine | Admitting: Emergency Medicine

## 2023-10-07 ENCOUNTER — Encounter: Payer: Self-pay | Admitting: Intensive Care

## 2023-10-07 ENCOUNTER — Other Ambulatory Visit: Payer: Self-pay

## 2023-10-07 DIAGNOSIS — I10 Essential (primary) hypertension: Secondary | ICD-10-CM | POA: Insufficient documentation

## 2023-10-07 DIAGNOSIS — R1900 Intra-abdominal and pelvic swelling, mass and lump, unspecified site: Secondary | ICD-10-CM | POA: Diagnosis present

## 2023-10-07 DIAGNOSIS — E119 Type 2 diabetes mellitus without complications: Secondary | ICD-10-CM | POA: Diagnosis not present

## 2023-10-07 DIAGNOSIS — E039 Hypothyroidism, unspecified: Secondary | ICD-10-CM | POA: Diagnosis not present

## 2023-10-07 DIAGNOSIS — L03311 Cellulitis of abdominal wall: Secondary | ICD-10-CM | POA: Diagnosis not present

## 2023-10-07 LAB — CBC WITH DIFFERENTIAL/PLATELET
Abs Immature Granulocytes: 0.01 10*3/uL (ref 0.00–0.07)
Basophils Absolute: 0.1 10*3/uL (ref 0.0–0.1)
Basophils Relative: 1 %
Eosinophils Absolute: 0.2 10*3/uL (ref 0.0–0.5)
Eosinophils Relative: 4 %
HCT: 40.1 % (ref 36.0–46.0)
Hemoglobin: 13.3 g/dL (ref 12.0–15.0)
Immature Granulocytes: 0 %
Lymphocytes Relative: 34 %
Lymphs Abs: 1.9 10*3/uL (ref 0.7–4.0)
MCH: 30.2 pg (ref 26.0–34.0)
MCHC: 33.2 g/dL (ref 30.0–36.0)
MCV: 90.9 fL (ref 80.0–100.0)
Monocytes Absolute: 0.5 10*3/uL (ref 0.1–1.0)
Monocytes Relative: 10 %
Neutro Abs: 3 10*3/uL (ref 1.7–7.7)
Neutrophils Relative %: 51 %
Platelets: 228 10*3/uL (ref 150–400)
RBC: 4.41 MIL/uL (ref 3.87–5.11)
RDW: 13.3 % (ref 11.5–15.5)
WBC: 5.7 10*3/uL (ref 4.0–10.5)
nRBC: 0 % (ref 0.0–0.2)

## 2023-10-07 LAB — COMPREHENSIVE METABOLIC PANEL WITH GFR
ALT: 39 U/L (ref 0–44)
AST: 28 U/L (ref 15–41)
Albumin: 3.6 g/dL (ref 3.5–5.0)
Alkaline Phosphatase: 101 U/L (ref 38–126)
Anion gap: 12 (ref 5–15)
BUN: 15 mg/dL (ref 6–20)
CO2: 23 mmol/L (ref 22–32)
Calcium: 8.6 mg/dL — ABNORMAL LOW (ref 8.9–10.3)
Chloride: 102 mmol/L (ref 98–111)
Creatinine, Ser: 1.13 mg/dL — ABNORMAL HIGH (ref 0.44–1.00)
GFR, Estimated: 56 mL/min — ABNORMAL LOW (ref >60)
Glucose, Bld: 140 mg/dL — ABNORMAL HIGH (ref 70–99)
Potassium: 3.7 mmol/L (ref 3.5–5.1)
Sodium: 137 mmol/L (ref 135–145)
Total Bilirubin: 0.5 mg/dL (ref 0.0–1.2)
Total Protein: 7.5 g/dL (ref 6.5–8.1)

## 2023-10-07 LAB — LACTIC ACID, PLASMA: Lactic Acid, Venous: 2 mmol/L (ref 0.5–1.9)

## 2023-10-07 MED ORDER — CLINDAMYCIN HCL 150 MG PO CAPS
450.0000 mg | ORAL_CAPSULE | Freq: Once | ORAL | Status: AC
  Administered 2023-10-07: 450 mg via ORAL
  Filled 2023-10-07: qty 3

## 2023-10-07 MED ORDER — CLINDAMYCIN HCL 150 MG PO CAPS: 450.0000 mg | ORAL_CAPSULE | Freq: Three times a day (TID) | ORAL | 0 refills | Status: AC

## 2023-10-07 NOTE — ED Provider Notes (Signed)
 [facility] Provider Note    Event Date/Time   First MD Initiated Contact with Patient 10/07/23 1741     (approximate)   History   Chief Complaint: Wound Infection   HPI  Christine Mcneil is a 58 y.o. female with a history of diabetes, hypertension who comes to the ED for evaluation of pain redness and swelling of the right side abdominal wall.  Denies fever chest pain shortness of breath or malaise or vomiting.  Normal oral intake.  She saw her doctor 3 days ago who started her on Bactrim for cellulitis, but she reports it is not improving yet.  Cellulitis is centered around her previous location of continuous glucose monitor.  She has been putting warm compresses.  No thick drainage.      Past Medical History:  Diagnosis Date   Abdominal pain    Anxiety and depression    Atypical facial pain    Bilateral occipital neuralgia    Cervical spondylosis without myelopathy    Chronic daily headache    Chronic pain    Chronic pelvic pain in female    Chronic thoracic back pain    Depression    Diabetes mellitus without complication (HCC)    Diabetes mellitus, type II (HCC)    Dysphagia    Dysrhythmia    Fibromyalgia    Hyperlipidemia    Hypertension    Hypothyroidism    Insomnia    Iron  deficiency anemia    Left hip pain    Low back pain    Migraines    Numbness    Optic neuropathy    OSA (obstructive sleep apnea)    Polyarthralgia    Primary osteoarthritis of both hips    Prothrombin gene mutation (HCC)    Pseudotumor cerebri    Rectal bleeding    Right shoulder pain    Rotator cuff syndrome    Stroke [facility])    Thyroid disease    TIA (transient ischemic attack) 05/27/2017    Current Outpatient Rx   Order #: [id number] Class: Normal   Order #: [id number] Class: Historical Med   Order #: [id number] Class: Historical Med   Order #: [id number] Class: Historical Med   Order #: [id number] Class: Normal   Order #: [id number] Class: Historical Med    Order #: [id number] Class: Historical Med   Order #: [id number] Class: Normal   Order #: [id number] Class: Normal   Order #: [id number] Class: Historical Med   Order #: [id number] Class: Historical Med   Order #: [id number] Class: Historical Med   Order #: [id number] Class: Historical Med   Order #: [id number] Class: Historical Med   Order #: [id number] Class: Historical Med   Order #: [id number] Class: Historical Med   Order #: [id number] Class: Historical Med   Order #: [id number] Class: Normal   Order #: [id number] Class: Historical Med   Order #: [id number] Class: Normal   Order #: [id number] Class: Historical Med   Order #: [id number] Class: Historical Med   Order #: [id number] Class: Historical Med   Order #: [id number] Class: Historical Med   Order #: [id number] Class: Normal   Order #: [id number] Class: Historical Med   Order #: [id number] Class: No Print   Order #: [id number] Class: Historical Med   Order #: [id number] Class: Normal   Order #: [id number] Class: Historical Med   Order #: [id number] Class: Historical Med   Order #: [id number] Class: Historical Med   Order #: [id number] Class: Historical Med   Order #: [id number] Class: Historical Med   Order #: [id number] Class: Historical Med   Order #: [id number] Class: Historical Med   Order #:  [id number] Class: Historical Med   Order #: [id number] Class: Historical Med   Order #: [id number] Class: Historical Med   Order #: [id number] Class: Historical Med   Order #: [id number] Class: Historical Med   Order #: [id number] Class: Historical Med   Order #: [id number] Class: Historical Med   Order #: [id number] Class: Normal   Order #: [id number] Class: Historical Med   Order #: [id number] Class: Historical Med   Order #: [id number] Class: Historical Med   Order #: [id number] Class: Normal   Order #: [id number] Class: Normal   Order #: [id number] Class: Historical Med   Order #: [id number] Class: Normal   Order #: [id number] Class: Historical Med    Past Surgical History:  Procedure Laterality Date    ABDOMINAL HYSTERECTOMY     total   CHOLECYSTECTOMY     COLONOSCOPY WITH PROPOFOL  N/A 03/21/2018   Procedure: COLONOSCOPY WITH PROPOFOL ;  Surgeon: Marnee Sink, MD;  Location: [facility] ENDOSCOPY;  Service: Endoscopy;  Laterality: N/A;   ERCP N/A 08/19/2020   Procedure: ENDOSCOPIC RETROGRADE CHOLANGIOPANCREATOGRAPHY (ERCP);  Surgeon: Marnee Sink, MD;  Location: [facility] ENDOSCOPY;  Service: Endoscopy;  Laterality: N/A;   ESOPHAGOGASTRODUODENOSCOPY (EGD) WITH PROPOFOL  N/A 03/21/2018   Procedure: ESOPHAGOGASTRODUODENOSCOPY (EGD) WITH PROPOFOL ;  Surgeon: Marnee Sink, MD;  Location: [facility] ENDOSCOPY;  Service: Endoscopy;  Laterality: N/A;   GIVENS CAPSULE STUDY N/A 09/24/2019   Procedure: GIVENS CAPSULE STUDY;  Surgeon: Irby Mannan, MD;  Location: [facility] ENDOSCOPY;  Service: Endoscopy;  Laterality: N/A;   KNEE SURGERY Left    LEFT HEART CATH AND CORONARY ANGIOGRAPHY N/A 03/10/2022   Procedure: LEFT HEART CATH AND CORONARY ANGIOGRAPHY;  Surgeon: Antonette Batters, MD;  Location: [facility] INVASIVE CV LAB;  Service: Cardiovascular;  Laterality: N/A;   ROTATOR CUFF REPAIR Right    spinal neurostimulator      Physical Exam   Triage Vital Signs: ED Triage Vitals  Encounter Vitals Group     BP 10/07/23 1347 117/73     Systolic BP Percentile --      Diastolic BP Percentile --      Pulse Rate 10/07/23 1347 80     Resp 10/07/23 1347 18     Temp 10/07/23 1347 98.8 F (37.1 C)     Temp Source 10/07/23 1347 Oral     SpO2 10/07/23 1347 93 %     Weight 10/07/23 1348 250 lb (113.4 kg)     Height 10/07/23 1348 5\' 6"  (1.676 m)     Head Circumference --      Peak Flow --      Pain Score 10/07/23 1348 6     Pain Loc --      Pain Education --      Exclude from Growth Chart --     Most recent vital signs: Vitals:   10/07/23 1347  BP: 117/73  Pulse: 80  Resp: 18  Temp: 98.8 F (37.1 C)  SpO2: 93%    General: Awake, no distress.  CV:  Good peripheral perfusion.  Regular rate and  rhythm Resp:  Normal effort.  Abd:  No distention.  Abdomen is soft.  No intra-abdominal tenderness.  There is streaky erythema on the right abdominal wall with 2 to 3 cm of central induration.  No fluctuance or purulent drainage.  No crepitus.  Bedside point-of-care ultrasound utilized which does not demonstrate any significant fluid collection that would benefit from incision and drainage. Other:  Moist oral mucosa, good energy, good spirits   ED Results / Procedures / Treatments   Labs (all labs ordered are listed, but  only abnormal results are displayed) Labs Reviewed  LACTIC ACID, PLASMA - Abnormal; Notable for the following components:      Result Value   Lactic Acid, Venous 2.0 (*)    All other components within normal limits  COMPREHENSIVE METABOLIC PANEL WITH GFR - Abnormal; Notable for the following components:   Glucose, Bld 140 (*)    Creatinine, Ser 1.13 (*)    Calcium  8.6 (*)    GFR, Estimated 56 (*)    All other components within normal limits  CBC WITH DIFFERENTIAL/PLATELET  LACTIC ACID, PLASMA  URINALYSIS, W/ REFLEX TO CULTURE (INFECTION SUSPECTED)     EKG    RADIOLOGY    PROCEDURES:  Procedures   MEDICATIONS ORDERED IN ED: Medications  clindamycin  (CLEOCIN ) capsule 450 mg (has no administration in time range)     IMPRESSION / MDM / ASSESSMENT AND PLAN / ED COURSE  I reviewed the triage vital signs and the nursing notes.  DDx: Electrolyte derangement, AKI, leukocytosis, abdominal wall cellulitis  Patient's presentation is most consistent with acute presentation with potential threat to life or bodily function.     Clinical Course as of 10/07/23 1930  Fri Oct 07, 2023  1815 Patient presents with erythema and pain of the right abdominal wall where she has developed cellulitis from her continuous glucose monitor.  She is nontoxic, pain is manageable, vital signs are normal, no leukocytosis, normal chemistry.  Not septic.  Offered admission, but  patient is feeling overall well and that symptoms and illness are manageable with oral antibiotics at home.  Return precautions discussed.  She will continue doing warm compresses at home as well.  Will add clindamycin  for improved strep coverage [PS]    Clinical Course User Index [PS] Jacquie Maudlin, MD     FINAL CLINICAL IMPRESSION(S) / ED DIAGNOSES   Final diagnoses:  Cellulitis of abdominal wall     Rx / DC Orders   ED Discharge Orders          Ordered    clindamycin  (CLEOCIN ) 150 MG capsule  3 times daily        10/07/23 1814             Note:  This document was prepared using Dragon voice recognition software and may include unintentional dictation errors.   Jacquie Maudlin, MD 10/07/23 [phone]

## 2023-10-07 NOTE — ED Notes (Signed)
 Pt discharge information reviewed. Pt understands need for follow up care and when to return if symptoms worsen. Prescriptions discussed.  All questions answered. Pt is alert and oriented with even and regular respirations. Pt is seen ambulating out of department with strong steady gait.

## 2023-10-07 NOTE — ED Triage Notes (Signed)
 Patient presents with wound on right side of abdomen. Reports a week ago she felt irritation and wound forming under omipod. Pus and drainage present. Redness all around wound and hard to touch. C/o nausea  Denies fevers

## 2023-10-10 ENCOUNTER — Telehealth: Payer: Self-pay

## 2023-10-10 ENCOUNTER — Encounter: Payer: Self-pay | Admitting: Psychiatry

## 2023-10-10 ENCOUNTER — Ambulatory Visit (INDEPENDENT_AMBULATORY_CARE_PROVIDER_SITE_OTHER): Admitting: Psychiatry

## 2023-10-10 VITALS — BP 132/82 | HR 81 | Temp 98.0°F | Ht 66.0 in | Wt 251.2 lb

## 2023-10-10 DIAGNOSIS — F3341 Major depressive disorder, recurrent, in partial remission: Secondary | ICD-10-CM | POA: Diagnosis not present

## 2023-10-10 DIAGNOSIS — F411 Generalized anxiety disorder: Secondary | ICD-10-CM | POA: Diagnosis not present

## 2023-10-10 DIAGNOSIS — F5105 Insomnia due to other mental disorder: Secondary | ICD-10-CM

## 2023-10-10 DIAGNOSIS — F4310 Post-traumatic stress disorder, unspecified: Secondary | ICD-10-CM

## 2023-10-10 DIAGNOSIS — Z634 Disappearance and death of family member: Secondary | ICD-10-CM

## 2023-10-10 DIAGNOSIS — G4733 Obstructive sleep apnea (adult) (pediatric): Secondary | ICD-10-CM

## 2023-10-10 MED ORDER — BREXPIPRAZOLE 0.25 MG PO TABS: 0.2500 mg | ORAL_TABLET | Freq: Every day | ORAL | 1 refills | Status: DC

## 2023-10-10 MED ORDER — REXULTI 0.5 MG PO TABS: 0.5000 mg | ORAL_TABLET | Freq: Every day | ORAL | 1 refills | Status: DC

## 2023-10-10 NOTE — Telephone Encounter (Signed)
 i contacted dr. Lorelei Rogers office and explained what was going on. the front desk lady states that she would have to send provider a message to see if they could work her in because next available is un july. i asked if they could call pt and also call me back to let me know if she was able to follow up and or referred out.

## 2023-10-10 NOTE — Progress Notes (Signed)
 [facility] MD OP Progress Note  10/10/2023 1:00 PM Christine Mcneil  MRN:  [id number]  Chief Complaint:  Chief Complaint  Patient presents with   Follow-up   Depression   Anxiety   Insomnia   Medication Refill   Discussed the use of AI scribe software for clinical note transcription with the patient, who gave verbal consent to proceed.  History of Present Illness Christine Mcneil is a 58 year old Caucasian female, divorced, lives in [place], has a history of MDD, PTSD, GAD, insomnia, history of CVA, history of prothrombin gene mutation, chronic migraine headaches, B12 deficiency, iron  deficiency was evaluated in office today for a follow-up appointment.  She visited the emergency department on Oct 07, 2023, for cellulitis of abdomen and was prescribed clindamycin .  She continues to have ongoing problems related to that and agrees to get in touch with her primary care provider.  She has a history of depression, which is improving. She is currently taking sertraline  200 mg, Rexulti  0.75 mg, and hydroxyzine  25-50 mg twice a day as needed. She experiences some sadness due to being sick, but otherwise no hopelessness or thoughts of self-harm.   Her sleep remains a significant concern. She sleeps approximately four hours per night and is taking Sonata  (Zaleplon ) for sleep. She has an upcoming appointment with a pulmonologist for further evaluation of her sleep apnea.  She feels tired and has low energy, attributing these symptoms to her recent infection. No excessive anxiety or worry.  She has not been attending church recently, although she used to go with her sister. She missed a therapy appointment on the 20th but has another scheduled for the 28th.  She denies any suicidality, homicidality or perceptual disturbances.    Visit Diagnosis:    ICD-10-CM   1. Recurrent major depressive disorder, in partial remission (HCC)  F33.41 brexpiprazole  (REXULTI ) 0.25 MG TABS tablet    2. PTSD (post-traumatic  stress disorder)  F43.10 Brexpiprazole  (REXULTI ) 0.5 MG TABS    3. GAD (generalized anxiety disorder)  F41.1 Brexpiprazole  (REXULTI ) 0.5 MG TABS    4. Insomnia due to mental disorder  F51.05    History of uncorrected sleep apnea, history of depression, anxiety    5. Bereavement  Z63.4     6. OSA (obstructive sleep apnea)  G47.33       Past Psychiatric History: I have reviewed past psychiatric history from progress note on 07/19/2017.  Past trials of medications like Effexor, Klonopin , mirtazapine , trazodone , Belsomra , lemborexant -unable to afford.  Past Medical History:  Past Medical History:  Diagnosis Date   Abdominal pain    Anxiety and depression    Atypical facial pain    Bilateral occipital neuralgia    Cervical spondylosis without myelopathy    Chronic daily headache    Chronic pain    Chronic pelvic pain in female    Chronic thoracic back pain    Depression    Diabetes mellitus without complication (HCC)    Diabetes mellitus, type II (HCC)    Dysphagia    Dysrhythmia    Fibromyalgia    Hyperlipidemia    Hypertension    Hypothyroidism    Insomnia    Iron  deficiency anemia    Left hip pain    Low back pain    Migraines    Numbness    Optic neuropathy    OSA (obstructive sleep apnea)    Polyarthralgia    Primary osteoarthritis of both hips    Prothrombin gene mutation (  HCC)    Pseudotumor cerebri    Rectal bleeding    Right shoulder pain    Rotator cuff syndrome    Stroke [facility])    Thyroid disease    TIA (transient ischemic attack) 05/27/2017    Past Surgical History:  Procedure Laterality Date   ABDOMINAL HYSTERECTOMY     total   CHOLECYSTECTOMY     COLONOSCOPY WITH PROPOFOL  N/A 03/21/2018   Procedure: COLONOSCOPY WITH PROPOFOL ;  Surgeon: Marnee Sink, MD;  Location: [facility] ENDOSCOPY;  Service: Endoscopy;  Laterality: N/A;   ERCP N/A 08/19/2020   Procedure: ENDOSCOPIC RETROGRADE CHOLANGIOPANCREATOGRAPHY (ERCP);  Surgeon: Marnee Sink, MD;  Location:  [facility] ENDOSCOPY;  Service: Endoscopy;  Laterality: N/A;   ESOPHAGOGASTRODUODENOSCOPY (EGD) WITH PROPOFOL  N/A 03/21/2018   Procedure: ESOPHAGOGASTRODUODENOSCOPY (EGD) WITH PROPOFOL ;  Surgeon: Marnee Sink, MD;  Location: [facility] ENDOSCOPY;  Service: Endoscopy;  Laterality: N/A;   GIVENS CAPSULE STUDY N/A 09/24/2019   Procedure: GIVENS CAPSULE STUDY;  Surgeon: Irby Mannan, MD;  Location: [facility] ENDOSCOPY;  Service: Endoscopy;  Laterality: N/A;   KNEE SURGERY Left    LEFT HEART CATH AND CORONARY ANGIOGRAPHY N/A 03/10/2022   Procedure: LEFT HEART CATH AND CORONARY ANGIOGRAPHY;  Surgeon: Antonette Batters, MD;  Location: [facility] INVASIVE CV LAB;  Service: Cardiovascular;  Laterality: N/A;   ROTATOR CUFF REPAIR Right    spinal neurostimulator      Family Psychiatric History: I have reviewed family psychiatric history from progress note on 07/19/2017  Family History:  Family History  Problem Relation Age of Onset   Diabetes Mother        Mother passed away on June 01, 2023.   Hyperlipidemia Mother    Hypertension Mother    COPD Mother    CVA Mother    Anemia Mother    Diabetes Father    Hyperlipidemia Father    Hypertension Father    Thyroid disease Father    Alcohol abuse Father    Peripheral vascular disease Sister    Hypertension Sister    Anxiety disorder Son    Depression Son    Bipolar disorder Son    Seizures Son     Social History: I have reviewed social history from progress note on 07/19/2017. Social History   Socioeconomic History   Marital status: Divorced    Spouse name: Not on file   Number of children: 1   Years of education: Not on file   Highest education level: High school graduate  Occupational History    Comment: disablitiy  Tobacco Use   Smoking status: Never   Smokeless tobacco: Never  Vaping Use   Vaping status: Never Used  Substance and Sexual Activity   Alcohol use: No   Drug use: No   Sexual activity: Not Currently  Other Topics Concern   Not on  file  Social History Narrative   Not on file   Social Drivers of Health   Financial Resource Strain: Low Risk  (07/06/2023)   Received from [facility]   Overall Financial Resource Strain (CARDIA)    Difficulty of Paying Living Expenses: Not hard at all  Food Insecurity: No Food Insecurity (07/06/2023)   Received from [facility]   Hunger Vital Sign    Worried About Running Out of Food in the Last Year: Never true    Ran Out of Food in the Last Year: Never true  Transportation Mcneil: No Transportation Mcneil (07/06/2023)   Received from [facility]  PRAPARE - Transportation    In the past 12 months, has lack of transportation kept you from medical appointments or from getting medications?: No    Lack of Transportation (Non-Medical): No  Physical Activity: Inactive (07/19/2017)   Exercise Vital Sign    Days of Exercise per Week: 0 days    Minutes of Exercise per Session: 0 min  Stress: Stress Concern Present (07/19/2017)   [employer] of Occupational Health - Occupational Stress Questionnaire    Feeling of Stress : Very much  Social Connections: Moderately Isolated (07/19/2017)   Social Connection and Isolation Panel [NHANES]    Frequency of Communication with Friends and Family: Once a week    Frequency of Social Gatherings with Friends and Family: Never    Attends Religious Services: More than 4 times per year    Active Member of [employer] or Organizations: No    Attends Banker Meetings: Never    Marital Status: Divorced    Allergies:  Allergies  Allergen Reactions   Amoxapine Itching   Amoxicillin Itching   Dapagliflozin Itching    Other reaction(s): Other (See Comments) Thrush & vaginal itching   Keflex [Cephalexin] Itching   Mirtazapine  Other (See Comments)    Pt states felling "like my body is outside of me."    Metabolic Disorder Labs: Lab Results  Component Value Date   HGBA1C 9.9 (H)  06/21/2017   MPG 237.43 06/21/2017   Lab Results  Component Value Date   PROLACTIN 4.3 (L) 05/26/2021   PROLACTIN 5.5 09/06/2017   Lab Results  Component Value Date   CHOL 161 02/22/2023   TRIG 294 (H) 02/22/2023   HDL 32 (L) 05/26/2021   CHOLHDL 4.9 (H) 05/26/2021   VLDL 39 06/21/2017   LDLCALC 84 05/26/2021   LDLCALC 174 (H) 06/21/2017   Lab Results  Component Value Date   TSH 2.890 09/06/2017   TSH 3.176 01/04/2017    Therapeutic Level Labs: No results found for: "LITHIUM" No results found for: "VALPROATE" No results found for: "CBMZ"  Current Medications: Current Outpatient Medications  Medication Sig Dispense Refill   ACCU-CHEK AVIVA PLUS test strip      ACCU-CHEK SOFTCLIX LANCETS lancets      Acetaminophen  (TYLENOL  ARTHRITIS EXT RELIEF PO) Take 600 mg by mouth daily.     albuterol  (VENTOLIN  HFA) 108 (90 Base) MCG/ACT inhaler Inhale 2 puffs into the lungs every 6 (six) hours as needed for wheezing or shortness of breath. 1 g 0   atorvastatin  (LIPITOR) 80 MG tablet Take 80 mg by mouth daily at 6 PM.     Blood Glucose Monitoring Suppl (ACCU-CHEK AVIVA PLUS) w/Device KIT      Carboxymethylcellulose Sod PF 0.5 % SOLN Apply 1 drop to eye daily as needed.     Cholecalciferol  (VITAMIN D3) 1000 units CAPS Take 1 capsule by mouth daily.     clindamycin  (CLEOCIN ) 150 MG capsule Take 3 capsules (450 mg total) by mouth 3 (three) times daily for 7 days. 63 capsule 0   clobetasol ointment (TEMOVATE) 0.05 % Apply topically.     Continuous Blood Gluc Receiver (DEXCOM G6 RECEIVER) DEVI Use to monitor blood sugar. NDC [phone]     Continuous Blood Gluc Sensor (DEXCOM G6 SENSOR) MISC Use to monitor blood sugar.  Replace every 10 days. NDC [phone].     Continuous Blood Gluc Transmit (DEXCOM G6 TRANSMITTER) MISC Use to monitor blood sugar.  Replace every 3 months. NDC [phone].     cyclobenzaprine (  FLEXERIL) 5 MG tablet Take 1 tablet by mouth 3 (three) times daily as  needed.     dicyclomine  (BENTYL ) 20 MG tablet TAKE 1 TABLET (20 MG TOTAL) BY MOUTH 3 (THREE) TIMES DAILY BEFORE MEALS. 270 tablet 0   DILT-XR 240 MG 24 hr capsule Take 240 mg by mouth daily.     doxycycline  (VIBRA -TABS) 100 MG tablet Take 1 tablet (100 mg total) by mouth 2 (two) times daily. 14 tablet 0   esomeprazole (NEXIUM) 40 MG capsule Take 40 mg by mouth daily.     ezetimibe (ZETIA) 10 MG tablet Take 10 mg by mouth at bedtime.     famotidine  (PEPCID ) 40 MG tablet Take 40 mg by mouth at bedtime.     ferrous sulfate 325 (65 FE) MG tablet Take by mouth. Take 325 mg daily with breakfast     fluticasone  (FLONASE ) 50 MCG/ACT nasal spray Place 1 spray into both nostrils daily. 9.9 mL 0   furosemide (LASIX) 40 MG tablet TAKE (1) TABLET BY MOUTH EVERY DAY     gabapentin  (NEURONTIN ) 600 MG tablet Take 2 tablets (1,200 mg total) by mouth at bedtime. Prescribed by pain doc     HUMALOG KWIKPEN 200 UNIT/ML KwikPen SMARTSIG:100 Unit(s) SUB-Q Daily     hydrOXYzine  (VISTARIL ) 25 MG capsule Take 1-2 capsules (25-50 mg total) by mouth 2 (two) times daily as needed for anxiety (and sleep). 360 capsule 0   Insulin  Disposable Pump (OMNIPOD 5 G6 INTRO, GEN 5,) KIT SMARTSIG:1 SUB-Q Every 3 Days     Insulin  Pen Needle (TRUEPLUS PEN NEEDLES) 29G X MISC      levothyroxine  (SYNTHROID ) 112 MCG tablet Take 112 mcg by mouth daily.     lidocaine  4 % 1 patch daily.     magnesium oxide (MAG-OX) 400 MG tablet Take 1 tablet by mouth daily.     Melatonin 10 MG TABS Take 2 tablets by mouth at bedtime.     meloxicam (MOBIC) 15 MG tablet Take 15 mg by mouth daily.     metFORMIN  (GLUCOPHAGE -XR) 500 MG 24 hr tablet Take 500 mg by mouth 2 (two) times daily.      methocarbamol (ROBAXIN) 500 MG tablet Take 500 mg by mouth 2 (two) times daily as needed.     metoprolol  succinate (TOPROL -XL) 25 MG 24 hr tablet Take 1 tablet by mouth daily.     mometasone (ELOCON) 0.1 % cream Apply topically.     naloxone (NARCAN) nasal spray 4  mg/0.1 mL Place into the nose.     nystatin (MYCOSTATIN) 100000 UNIT/ML suspension Take 5 mLs by mouth 4 (four) times daily.     omeprazole  (PRILOSEC) 20 MG capsule TAKE (1) CAPSULE BY MOUTH EVERY DAY 30 capsule 0   ondansetron  (ZOFRAN ) 4 MG tablet Take 4 mg by mouth every 8 (eight) hours as needed.     RYBELSUS 7 MG TABS Take 1 tablet by mouth daily.     sertraline  (ZOLOFT ) 100 MG tablet TAKE 2 TABLETS (200 MG TOTAL) BY MOUTH DAILY WITH BREAKFAST. 180 tablet 0   vitamin B-12 (CYANOCOBALAMIN ) 100 MCG tablet Take 1 tablet (100 mcg total) by mouth daily. 90 tablet 1   XTAMPZA  ER 9 MG C12A PLEASE SEE ATTACHED FOR DETAILED DIRECTIONS     zaleplon  (SONATA ) 5 MG capsule Take 1 capsule (5 mg total) by mouth at bedtime as needed for sleep. 30 capsule 3   zonisamide (ZONEGRAN) 100 MG capsule Take 100 mg by mouth daily.  brexpiprazole  (REXULTI ) 0.25 MG TABS tablet Take 1 tablet (0.25 mg total) by mouth daily. Take along with 0.5 mg daily , total of 0.75 mg daily. 90 tablet 1   Brexpiprazole  (REXULTI ) 0.5 MG TABS Take 1 tablet (0.5 mg total) by mouth daily. Take along with 0.25 mg , total of 0.75 mg daily 90 tablet 1   dabigatran  (PRADAXA ) 150 MG CAPS capsule Take 150 mg by mouth 2 (two) times daily.     nitroGLYCERIN  (NITROSTAT ) 0.4 MG SL tablet Place under the tongue.     pioglitazone (ACTOS) 15 MG tablet Take by mouth. Take 1 tablet by mouth once daily     No current facility-administered medications for this visit.   Facility-Administered Medications Ordered in Other Visits  Medication Dose Route Frequency Provider Last Rate Last Admin   iohexol  (OMNIPAQUE ) 300 MG/ML solution    PRN Callwood, Dwayne D, MD   60 mL at 03/10/22 1442     Musculoskeletal: Strength & Muscle Tone: within normal limits Gait & Station: normal Patient leans: N/A  Psychiatric Specialty Exam: Review of Systems  Psychiatric/Behavioral:  Positive for dysphoric mood and sleep disturbance. The patient is nervous/anxious.      Blood pressure 132/82, pulse 81, temperature 98 F (36.7 C), temperature source Temporal, height 5\' 6"  (1.676 m), weight 251 lb 3.2 oz (113.9 kg), SpO2 93%.Body mass index is 40.54 kg/m.  General Appearance: Casual  Eye Contact:  Fair  Speech:  Normal Rate  Volume:  Normal  Mood:  Anxious and Depressed  Affect:  Appropriate  Thought Process:  Goal Directed and Descriptions of Associations: Intact  Orientation:  Full (Time, Place, and Person)  Thought Content: Logical   Suicidal Thoughts:  No  Homicidal Thoughts:  No  Memory:  Immediate;   Fair Recent;   Fair Remote;   Fair  Judgement:  Fair  Insight:  Fair  Psychomotor Activity:  Normal  Concentration:  Concentration: Fair and Attention Span: Fair  Recall:  [employer] of Knowledge: Fair  Language: Fair  Akathisia:  No  Handed:  Right  AIMS (if indicated): done  Assets:  Communication Skills Desire for Improvement Housing Social Support Transportation  ADL's:  Intact  Cognition: WNL  Sleep:  Poor   Screenings: Geneticist, molecular Office Visit from 10/10/2023 in [facility] Office Visit from 05/09/2023 in [facility] Office Visit from 03/23/2023 in [facility] Office Visit from 02/21/2023 in [facility] Office Visit from 01/26/2023 in [facility]  AIMS Total Score 0 0 0 0 0      GAD-7    Flowsheet Row Counselor from 08/01/2023 in [facility] Office Visit from 05/09/2023 in [facility] Office Visit from 03/23/2023 in [facility] Office Visit from 02/21/2023 in [facility] Office Visit from 01/26/2023 in [facility]  Total  GAD-7 Score 10 11 11 19 18       PHQ2-9    Flowsheet Row Office Visit from 10/10/2023 in [facility] Counselor from 08/01/2023 in [facility] Office Visit from 05/09/2023 in [facility] Office Visit from 03/23/2023 in [facility] Office Visit from 02/21/2023 in [facility]  PHQ-2  Total Score 2 4 3 5 6   PHQ-9 Total Score 9 10 11 12 15       Flowsheet Row Office Visit from 10/10/2023 in [facility] ED from 10/07/2023 in [facility] at [facility] ED from 08/11/2023 in [facility] at [facility]  C-SSRS RISK CATEGORY Moderate Risk No Risk No Risk        Assessment and Plan: SAHMYA ARAI is a 58 year old Caucasian female who has a history of depression, PTSD, insomnia, bereavement was evaluated in office today, discussed assessment and plan as noted below.  Insomnia-unstable Sleep continues to be affected multifactorial including uncorrected sleep apnea, pain.  Patient agrees to follow up with pulmonology for recommendations. - Continue sleep hygiene techniques. - Follow up with pulmonology as scheduled. - Continue Sonata  5 mg at bedtime as needed.  MDD in partial remission Currently mood symptoms are mostly related to the abdominal cellulitis.  Tolerating sertraline  and Rexulti  well. - Continue Sertraline  200 mg daily - Continue Rexulti  0.75 mg daily  PTSD-improving Will continue to benefit from psychotherapy has upcoming appointment. - Continue CBT with Ms. Perkins - Continue sertraline  as prescribed  GAD-improving Reports anxiety symptoms as improving on the current medication regimen. - Continue Hydroxyzine  25-50 mg twice a day as needed - Continue Sertraline  200 mg  daily  Bereavement-improving Continues to have good social support system and is motivated to stay in therapy. - Continue follow-up with therapist.  Follow-up Follow-up in clinic in 2 months or sooner if needed.   Collaboration of Care: Collaboration of Care: Referral or follow-up with counselor/therapist AEB encouraged to continue CBT with therapist.  Patient/Guardian was advised Release of Information must be obtained prior to any record release in order to collaborate their care with an outside provider. Patient/Guardian was advised if they have not already done so to contact the registration department to sign all necessary forms in order for us  to release information regarding their care.   Consent: Patient/Guardian gives verbal consent for treatment and assignment of benefits for services provided during this visit. Patient/Guardian expressed understanding and agreed to proceed.  This note was generated in part or whole with voice recognition software. Voice recognition is usually quite accurate but there are transcription errors that can and very often do occur. I apologize for any typographical errors that were not detected and corrected.     Guelda Batson, MD 10/11/2023, 8:26 AM

## 2023-10-10 NOTE — Telephone Encounter (Signed)
 Noted

## 2023-10-10 NOTE — Telephone Encounter (Signed)
 dr. Lorelei Rogers nurse called office she states that they are going to try to see if the surgery department can look at it. and they will call pt.  She stated that it looked bad when she came in to see her and she was told to go to the ER.  Pt stated that she went to er and they just gave her an antibiotic.

## 2023-10-10 NOTE — Telephone Encounter (Signed)
 pt came into office for appt with dr. Tere Felts today she states that her stomach hurts and that the diabet monitor she has on her stomach is infected. she raised her shirt up and it is about a 1inch wide draining and oozing pus. and is red and has heat around the area.  Pt states that she went to the ER because it was hurting her so bad a couple days ago and they put her on antibiotics but she can not tell any difference. I asked her who her provider was and she stated that it was dr. Lorelei Rogers at [facility]. She states that she has an appt in August.  I told her that she needs to be seen sooner than that.  I asked if it was ok if I contacted there office and let them know what is going on and to see if yu can be seen sooner.

## 2023-10-11 ENCOUNTER — Encounter (INDEPENDENT_AMBULATORY_CARE_PROVIDER_SITE_OTHER): Payer: Self-pay

## 2023-10-12 ENCOUNTER — Ambulatory Visit: Admitting: Internal Medicine

## 2023-10-12 ENCOUNTER — Encounter: Payer: Self-pay | Admitting: Internal Medicine

## 2023-10-12 VITALS — BP 114/88 | HR 97 | Temp 97.9°F | Ht 66.0 in | Wt 248.8 lb

## 2023-10-12 DIAGNOSIS — R0683 Snoring: Secondary | ICD-10-CM | POA: Diagnosis not present

## 2023-10-12 DIAGNOSIS — G4710 Hypersomnia, unspecified: Secondary | ICD-10-CM | POA: Diagnosis not present

## 2023-10-12 DIAGNOSIS — I10 Essential (primary) hypertension: Secondary | ICD-10-CM

## 2023-10-12 DIAGNOSIS — J4520 Mild intermittent asthma, uncomplicated: Secondary | ICD-10-CM | POA: Diagnosis not present

## 2023-10-12 DIAGNOSIS — E669 Obesity, unspecified: Secondary | ICD-10-CM

## 2023-10-12 DIAGNOSIS — G4733 Obstructive sleep apnea (adult) (pediatric): Secondary | ICD-10-CM

## 2023-10-12 DIAGNOSIS — R5381 Other malaise: Secondary | ICD-10-CM

## 2023-10-12 NOTE — Patient Instructions (Signed)
 Recommend home sleep study to assess for sleep apnea Use albuterol  as needed for your breathing Recommend weight loss  Avoid Allergens and Irritants Avoid secondhand smoke Avoid SICK contacts Recommend  Masking  when appropriate Recommend Keep up-to-date with vaccinations

## 2023-10-12 NOTE — Progress Notes (Signed)
 Name: Christine Mcneil MRN: [id number] DOB: [birth year]    CHIEF COMPLAINT:  EXCESSIVE DAYTIME SLEEPINESS Assessment of OSA   HISTORY OF PRESENT ILLNESS: Patient is seen today for problems and issues with sleep related to excessive daytime sleepiness Patient  has been having sleep problems for many years Patient has been having excessive daytime sleepiness for a long time Patient has been having extreme fatigue and tiredness, lack of energy +  very Loud snoring every night  Discussed sleep data and reviewed with patient.  Encouraged proper weight management.  Discussed driving precautions and its relationship with hypersomnolence.  Discussed operating dangerous equipment and its relationship with hypersomnolence.  Discussed sleep hygiene, and benefits of a fixed sleep waked time.  The importance of getting eight or more hours of sleep discussed with patient.  Discussed limiting the use of the computer and television before bedtime.  Decrease naps during the day, so night time sleep will become enhanced.  Limit caffeine , and sleep deprivation.  HTN, stroke, and heart failure are potential risk factors.       10/12/2023    1:00 PM  Results of the Epworth flowsheet  Sitting and reading 0  Watching TV 2  Sitting, inactive in a public place (e.g. a theatre or a meeting) 0  As a passenger in a car for an hour without a break 1  Lying down to rest in the afternoon when circumstances permit 3  Sitting and talking to someone 0  Sitting quietly after a lunch without alcohol 0  In a car, while stopped for a few minutes in traffic 0  Total score 6   No exacerbation at this time No evidence of heart failure at this time No evidence or signs of infection at this time No respiratory distress No fevers, chills, nausea, vomiting, diarrhea No evidence of lower extremity edema No evidence hemoptysis  Patient uses albuterol  as needed Patient weighs 248 pounds Likely has reactive airways  disease and asthma No maintenance therapy at this time No respiratory issues at this time   PAST MEDICAL HISTORY :   has a past medical history of Abdominal pain, Anxiety and depression, Atypical facial pain, Bilateral occipital neuralgia, Cervical spondylosis without myelopathy, Chronic daily headache, Chronic pain, Chronic pelvic pain in female, Chronic thoracic back pain, Depression, Diabetes mellitus without complication (HCC), Diabetes mellitus, type II (HCC), Dysphagia, Dysrhythmia, Fibromyalgia, Hyperlipidemia, Hypertension, Hypothyroidism, Insomnia, Iron  deficiency anemia, Left hip pain, Low back pain, Migraines, Numbness, Optic neuropathy, OSA (obstructive sleep apnea), Polyarthralgia, Primary osteoarthritis of both hips, Prothrombin gene mutation (HCC), Pseudotumor cerebri, Rectal bleeding, Right shoulder pain, Rotator cuff syndrome, Stroke [facility]), Thyroid disease, and TIA (transient ischemic attack) (05/27/2017).  has a past surgical history that includes Cholecystectomy; Rotator cuff repair (Right); Knee surgery (Left); Abdominal hysterectomy; spinal neurostimulator; Colonoscopy with propofol  (N/A, 03/21/2018); Esophagogastroduodenoscopy (egd) with propofol  (N/A, 03/21/2018); Givens capsule study (N/A, 09/24/2019); ERCP (N/A, 08/19/2020); and LEFT HEART CATH AND CORONARY ANGIOGRAPHY (N/A, 03/10/2022). Prior to Admission medications   Medication Sig Start Date End Date Taking? Authorizing Provider  ACCU-CHEK AVIVA PLUS test strip  03/27/18   [provider]  ACCU-CHEK SOFTCLIX LANCETS lancets  11/07/17   [provider]  Acetaminophen  (TYLENOL  ARTHRITIS EXT RELIEF PO) Take 600 mg by mouth daily.    [provider]  albuterol  (VENTOLIN  HFA) 108 (90 Base) MCG/ACT inhaler Inhale 2 puffs into the lungs every 6 (six) hours as needed for wheezing or shortness of breath. 03/08/19   Dannial Duty,  MD  atorvastatin  (LIPITOR) 80 MG tablet Take 80 mg by mouth daily at 6 PM.  12/22/17   [provider]  Blood Glucose Monitoring Suppl (ACCU-CHEK AVIVA PLUS) w/Device KIT  11/07/17   [provider]  brexpiprazole  (REXULTI ) 0.25 MG TABS tablet Take 1 tablet (0.25 mg total) by mouth daily. Take along with 0.5 mg daily , total of 0.75 mg daily. 10/10/23   Eappen, Saramma, MD  Brexpiprazole  (REXULTI ) 0.5 MG TABS Take 1 tablet (0.5 mg total) by mouth daily. Take along with 0.25 mg , total of 0.75 mg daily 10/10/23   Eappen, Saramma, MD  Carboxymethylcellulose Sod PF 0.5 % SOLN Apply 1 drop to eye daily as needed. 05/15/15   [provider]  Cholecalciferol  (VITAMIN D3) 1000 units CAPS Take 1 capsule by mouth daily.    [provider]  clindamycin  (CLEOCIN ) 150 MG capsule Take 3 capsules (450 mg total) by mouth 3 (three) times daily for 7 days. 10/07/23 10/14/23  Jacquie Maudlin, MD  clobetasol ointment (TEMOVATE) 0.05 % Apply topically. 06/16/21   [provider]  Continuous Blood Gluc Receiver (DEXCOM G6 RECEIVER) DEVI Use to monitor blood sugar. [facility] [address]-95 08/07/21   [provider]  Continuous Blood Gluc Sensor (DEXCOM G6 SENSOR) MISC Use to monitor blood sugar.  Replace every 10 days. [facility] [address]-84. 08/07/21   [provider]  Continuous Blood Gluc Transmit (DEXCOM G6 TRANSMITTER) MISC Use to monitor blood sugar.  Replace every 3 months. [facility] [address]-13. 08/07/21   [provider]  cyclobenzaprine (FLEXERIL) 5 MG tablet Take 1 tablet by mouth 3 (three) times daily as needed. 01/14/21   [provider]  dabigatran  (PRADAXA ) 150 MG CAPS capsule Take 150 mg by mouth 2 (two) times daily. 10/12/16 08/17/22  [provider]  dicyclomine  (BENTYL ) 20 MG tablet TAKE 1 TABLET (20 MG TOTAL) BY MOUTH 3 (THREE) TIMES DAILY BEFORE MEALS. 08/29/23   Marnee Sink, MD  DILT-XR 240 MG 24 hr capsule Take 240 mg by mouth daily. 08/17/22   [provider]  doxycycline  (VIBRA -TABS) 100 MG tablet Take 1  tablet (100 mg total) by mouth 2 (two) times daily. 08/11/23   Ward, Clover Dao, DO  esomeprazole (NEXIUM) 40 MG capsule Take 40 mg by mouth daily.    [provider]  ezetimibe (ZETIA) 10 MG tablet Take 10 mg by mouth at bedtime.    [provider]  famotidine  (PEPCID ) 40 MG tablet Take 40 mg by mouth at bedtime. 10/10/19   [provider]  ferrous sulfate 325 (65 FE) MG tablet Take by mouth. Take 325 mg daily with breakfast    [provider]  fluticasone  (FLONASE ) 50 MCG/ACT nasal spray Place 1 spray into both nostrils daily. 08/11/23 08/10/24  Ward, Clover Dao, DO  furosemide (LASIX) 40 MG tablet TAKE (1) TABLET BY MOUTH EVERY DAY 11/17/21   [provider]  gabapentin  (NEURONTIN ) 600 MG tablet Take 2 tablets (1,200 mg total) by mouth at bedtime. Prescribed by pain doc 05/09/23   Eappen, Saramma, MD  HUMALOG KWIKPEN 200 UNIT/ML KwikPen SMARTSIG:100 Unit(s) SUB-Q Daily 09/29/22   [provider]  hydrOXYzine  (VISTARIL ) 25 MG capsule Take 1-2 capsules (25-50 mg total) by mouth 2 (two) times daily as needed for anxiety (and sleep). 11/23/22   Eappen, Saramma, MD  Insulin  Disposable Pump (OMNIPOD 5 G6 INTRO, GEN 5,) KIT SMARTSIG:1 SUB-Q Every 3 Days    [provider]  Insulin  Pen Needle (TRUEPLUS PEN NEEDLES) 29G  X MISC  10/22/21   [provider]  levothyroxine  (SYNTHROID ) 112 MCG tablet Take 112 mcg by mouth daily. 09/09/22   [provider]  lidocaine  4 % 1 patch daily. 06/30/22   [provider]  magnesium oxide (MAG-OX) 400 MG tablet Take 1 tablet by mouth daily. 12/03/20   [provider]  Melatonin 10 MG TABS Take 2 tablets by mouth at bedtime. 08/15/19   [provider]  meloxicam (MOBIC) 15 MG tablet Take 15 mg by mouth daily. 06/09/20   [provider]  metFORMIN  (GLUCOPHAGE -XR) 500 MG 24 hr tablet Take 500 mg by mouth 2 (two) times daily.  02/17/18   [provider]   methocarbamol (ROBAXIN) 500 MG tablet Take 500 mg by mouth 2 (two) times daily as needed. 06/06/23   [provider]  metoprolol  succinate (TOPROL -XL) 25 MG 24 hr tablet Take 1 tablet by mouth daily. 05/13/20   [provider]  mometasone (ELOCON) 0.1 % cream Apply topically. 05/07/21   [provider]  naloxone [facility]) nasal spray 4 mg/0.1 mL Place into the nose. 01/14/21   [provider]  nitroGLYCERIN  (NITROSTAT ) 0.4 MG SL tablet Place under the tongue. 02/02/21 02/02/22  [provider]  nystatin (MYCOSTATIN) 100000 UNIT/ML suspension Take 5 mLs by mouth 4 (four) times daily. 09/06/19   [provider]  omeprazole  (PRILOSEC) 20 MG capsule TAKE (1) CAPSULE BY MOUTH EVERY DAY 08/30/19   Tahiliani, Varnita B, MD  ondansetron  (ZOFRAN ) 4 MG tablet Take 4 mg by mouth every 8 (eight) hours as needed. 01/22/22   [provider]  pioglitazone (ACTOS) 15 MG tablet Take by mouth. Take 1 tablet by mouth once daily 11/07/17 02/21/23  [provider]  RYBELSUS 7 MG TABS Take 1 tablet by mouth daily. 02/22/22   [provider]  sertraline  (ZOLOFT ) 100 MG tablet TAKE 2 TABLETS (200 MG TOTAL) BY MOUTH DAILY WITH BREAKFAST. 09/30/23 12/29/23  Eappen, Saramma, MD  vitamin B-12 (CYANOCOBALAMIN ) 100 MCG tablet Take 1 tablet (100 mcg total) by mouth daily. 07/17/21   Nelda Balsam, NP  XTAMPZA  ER 9 MG C12A PLEASE SEE ATTACHED FOR DETAILED DIRECTIONS 06/16/21   [provider]  zaleplon  (SONATA ) 5 MG capsule Take 1 capsule (5 mg total) by mouth at bedtime as needed for sleep. 07/31/23 11/28/23  Eappen, Saramma, MD  zonisamide (ZONEGRAN) 100 MG capsule Take 100 mg by mouth daily. 06/02/22   [provider]   Allergies  Allergen Reactions   Amoxapine Itching   Amoxicillin Itching   Dapagliflozin Itching    Other reaction(s): Other (See Comments) Thrush & vaginal itching   Keflex [Cephalexin] Itching   Mirtazapine  Other (See  Comments)    Pt states felling "like my body is outside of me."    FAMILY HISTORY:  family history includes Alcohol abuse in her father; Anemia in her mother; Anxiety disorder in her son; Bipolar disorder in her son; COPD in her mother; CVA in her mother; Depression in her son; Diabetes in her father and mother; Hyperlipidemia in her father and mother; Hypertension in her father, mother, and sister; Peripheral vascular disease in her sister; Seizures in her son; Thyroid disease in her father. SOCIAL HISTORY:  reports that she has never smoked. She has never used smokeless tobacco. She reports that she does not drink alcohol and does not use drugs.  BP 114/88 (BP Location: Right Arm, Patient Position: Sitting, Cuff Size: Large)   Pulse 97  Temp 97.9 F (36.6 C) (Oral)   Ht 5\' 6"  (1.676 m)   Wt 248 lb 12.8 oz (112.9 kg)   LMP  (LMP Unknown)   SpO2 95%   BMI 40.16 kg/m     Review of Systems: Gen:  Denies  fever, sweats, chills weight loss  HEENT: Denies blurred vision, double vision, ear pain, eye pain, hearing loss, nose bleeds, sore throat Cardiac:  No dizziness, chest pain or heaviness, chest tightness,edema, No JVD Resp:   No cough, -sputum production, -shortness of breath,-wheezing, -hemoptysis,  Other:  All other systems negative   Physical Examination:   General Appearance: No distress  EYES PERRLA, EOM intact.   NECK Supple, No JVD Pulmonary: normal breath sounds, No wheezing.  CardiovascularNormal S1,S2.  No m/r/g.   Abdomen: Benign, Soft, non-tender. Neurology UE/LE 5/5 strength, no focal deficits Ext pulses intact, cap refill intact ALL OTHER ROS ARE NEGATIVE    ASSESSMENT AND PLAN SYNOPSIS  Patient with signs and symptoms of excessive daytime sleepiness with probable underlying diagnosis of obstructive sleep apnea in the setting of obesity and deconditioned state   Recommend Sleep Study for definitve diagnosis Recommend home sleep  study  Obesity -recommend significant weight loss -recommend changing diet  Deconditioned state -Recommend increased daily activity and exercise  Reactive airways disease asthma Albuterol  as needed  Hypertension - Sleep apnea can contribute to hypertension, therefore treatment of sleep apnea is important part of hypertension management.  Diabetes Mellitis - Sleep apnea can contribute to DM, therefore treatment of sleep apnea is important part of DM management.    MEDICATION ADJUSTMENTS/LABS AND TESTS ORDERED: Recommend Sleep Study Recommend weight loss   CURRENT MEDICATIONS REVIEWED AT LENGTH WITH PATIENT TODAY   Patient  satisfied with Plan of action and management. All questions answered  Follow up  3 months   I spent a total of  65 minutes reviewing chart data, face-to-face evaluation with the patient, counseling and coordination of care as detailed above.    Lady Pier, M.D.  [facility]  Medical Director [facility] Medical Director [facility] Cardio-Pulmonary Department

## 2023-10-19 ENCOUNTER — Ambulatory Visit (INDEPENDENT_AMBULATORY_CARE_PROVIDER_SITE_OTHER): Admitting: Licensed Clinical Social Worker

## 2023-10-19 DIAGNOSIS — F4310 Post-traumatic stress disorder, unspecified: Secondary | ICD-10-CM | POA: Diagnosis not present

## 2023-10-19 DIAGNOSIS — Z634 Disappearance and death of family member: Secondary | ICD-10-CM

## 2023-10-19 DIAGNOSIS — F411 Generalized anxiety disorder: Secondary | ICD-10-CM

## 2023-10-19 DIAGNOSIS — F3341 Major depressive disorder, recurrent, in partial remission: Secondary | ICD-10-CM

## 2023-10-19 NOTE — Progress Notes (Signed)
 THERAPIST PROGRESS NOTE  Session Time: 9:01am-9:45am  Participation Level: Active  Behavioral Response: CasualAlertDepressed  Type of Therapy: Individual Therapy  Treatment Goals addressed:      Goal: LTG: Reduce frequency, intensity, and duration of depression symptoms so that daily functioning is improved     Dates: Start:  08/01/23    Expected End:  02/01/24       Disciplines: Interdisciplinary, PROVIDER             Goal: LTG: Increase coping skills to manage depression and improve ability to perform daily activities     Dates: Start:  08/01/23    Expected End:  02/01/24       Disciplines: Interdisciplinary, PROVIDER             Goal: STG: Cieanna will identify cognitive patterns and beliefs that support depression     Dates: Start:  08/01/23    Expected End:  02/01/24       Disciplines: Interdisciplinary, PROVIDER             Goal: STG: "help me get over some of this anger" towards her relatives and working towards forgiveness.      Dates: Start:  08/01/23    Expected End:  02/01/24       Disciplines: Interdisciplinary, PROVIDER             Goal: STG - Misc 1     Dates: Start:  08/01/23    Expected End:  02/01/24       Description: "to be more happy [fishing more, laughing, get out more, hangout with sister]"    Disciplines: Counselor             Goal: Demonstrates progress in stages of grief at own pace       ProgressTowards Goals: Initial  Interventions: Solution Focused and Supportive  Summary: Christine Mcneil is a 58 y.o. female who presents with  anxiety, depression, and bereavement. Patient identifies symptoms including lack of motivation, low mood, trouble falling asleep, low energy, change in appetite, anxious feelings, uncontrollable worry, tension, restlessness, and irritability. Pt was oriented times 5. Pt was cooperative and engaged. Pt denies SI/HI/AVH.     Patient utilized addition to reflecting reported stress with her physical health due to  complications following the procedure.  Patient identifies she can no longer eat due to decreased insulin  levels.    Reflected on feeling a lack of support and complications regarding communications with loved ones about their behaviors.  Patient states "I am tired "in reference she and her romantic relationship.  Patient identifies her partner makes her feel "worthless."  Reflected on cycle of unhealthy behaviors and pros/cons of staying in the relationship.    Patient reports she spends great deal of her time processing regrets with her mother's hospice care.  Patient identified guilt disappointment and frustration are presenting as anger towards those who she feels failed to help her care for her mother.  Clinician worked with patient on problem solving ways in which she can improve her physical wellbeing before she is able to address her mental health.  Patient identifies a goal to reach out to her diabetes provider to address her low insulin  levels as well as following up with her primary care provider.  Suicidal/Homicidal: Nowithout intent/plan  Therapist Response: Clinician utilized active listening to create a safe space for patient to process recent life events.  Clinician assessed for current symptoms, stressors, safety since last session.  Reflected on components  of healthy relationships and efforts patient can make and prioritizing her physical wellbeing.  Plan: Return again in 2 weeks.  Diagnosis: Recurrent major depressive disorder, in partial remission (HCC)  Bereavement  GAD (generalized anxiety disorder)  PTSD (post-traumatic stress disorder)   Collaboration of Care: AEB psychiatrist can access notes and cln. Will review psychiatrists' notes. Check in with the patient and will see LCSW per availability. Patient agreed with treatment recommendations.   Patient/Guardian was advised Release of Information must be obtained prior to any record release in order to collaborate  their care with an outside provider. Patient/Guardian was advised if they have not already done so to contact the registration department to sign all necessary forms in order for us  to release information regarding their care.   Consent: Patient/Guardian gives verbal consent for treatment and assignment of benefits for services provided during this visit. Patient/Guardian expressed understanding and agreed to proceed.   Marvin Slot, LCSW 10/19/2023

## 2023-11-01 ENCOUNTER — Encounter

## 2023-11-01 DIAGNOSIS — G4733 Obstructive sleep apnea (adult) (pediatric): Secondary | ICD-10-CM

## 2023-11-07 ENCOUNTER — Telehealth: Payer: Self-pay

## 2023-11-07 NOTE — Telephone Encounter (Addendum)
 Received a call from [facility], the referral coordinator at [facility], regarding a question about the patient's referral. The referral was submitted for a colonoscopy; however, it appears the patient underwent an EGD instead. I offered to transfer her to Dr. Curtis Dow nurse for further clarification.

## 2023-11-09 NOTE — Telephone Encounter (Signed)
 I spoke to [facility] to let her know per Dr Curtis Dow recommendations, Pt will not be due for repeat until 2029. I also advised that if pt is needing to be seen for symptoms, referral will need to be sent to another GI as we do not currently have providers to evaluate patients at this time

## 2023-11-11 DIAGNOSIS — G4733 Obstructive sleep apnea (adult) (pediatric): Secondary | ICD-10-CM | POA: Diagnosis not present

## 2023-11-15 ENCOUNTER — Ambulatory Visit (INDEPENDENT_AMBULATORY_CARE_PROVIDER_SITE_OTHER): Admitting: Licensed Clinical Social Worker

## 2023-11-15 DIAGNOSIS — F3341 Major depressive disorder, recurrent, in partial remission: Secondary | ICD-10-CM

## 2023-11-15 DIAGNOSIS — F4310 Post-traumatic stress disorder, unspecified: Secondary | ICD-10-CM

## 2023-11-15 DIAGNOSIS — Z634 Disappearance and death of family member: Secondary | ICD-10-CM

## 2023-11-15 DIAGNOSIS — F411 Generalized anxiety disorder: Secondary | ICD-10-CM

## 2023-11-15 NOTE — Progress Notes (Signed)
 THERAPIST PROGRESS NOTE  Session Time: 1:01pm-1:54pm  Participation Level: Active  Behavioral Response: CasualAlertEuthymic  Type of Therapy: Individual Therapy  Treatment Goals addressed:  Active     Anxiety     LTG: Zamzam will score less than 5 on the Generalized Anxiety Disorder 7 Scale (GAD-7)      Start:  08/01/23    Expected End:  02/01/24         STG: Millee will reduce frequency of avoidant behaviors by 50% as evidenced by self-report in therapy sessions     Start:  08/01/23    Expected End:  02/01/24         Perform psychoeducation regarding anxiety disorders     Start:  08/01/23         Work with patient individually to identify the major components of a recent episode of anxiety: physical symptoms, major thoughts and images, and major behaviors they experienced     Start:  08/01/23         Perform motivational interviewing regarding increasing desire and follow though of fishing excursions.      Start:  08/01/23         Coping Skills      Start:  08/01/23       Will work with the pt using CBT/DBT techniques to help the pt verbalize an understanding of the cognitive, physiological, and behavioral components of anxiety and its treatment. This will be done by using worksheets, interactive activities, CBT/ABC thought logs, modeling, homework, role playing and journaling. Will work with pt to learn and implement coping skills that result in a reduction of anxiety and improve daily functioning per pt self-report 3 out of 5 documented sessions.         OP Depression     LTG: Reduce frequency, intensity, and duration of depression symptoms so that daily functioning is improved     Start:  08/01/23    Expected End:  02/01/24         LTG: Increase coping skills to manage depression and improve ability to perform daily activities     Start:  08/01/23    Expected End:  02/01/24         STG: Jackquline will identify cognitive patterns and beliefs that  support depression     Start:  08/01/23    Expected End:  02/01/24         STG: help me get over some of this anger towards her relatives and working towards forgiveness.      Start:  08/01/23    Expected End:  02/01/24         STG - Misc 1     Start:  08/01/23    Expected End:  02/01/24      to be more happy [fishing more, laughing, get out more, hangout with sister]      Demonstrates progress in stages of grief at own pace     Start:  08/01/23    Expected End:  02/01/24            Work with [name] to identify the major components of a recent episode of depression: physical symptoms, major thoughts and images, and major behaviors they experienced     Start:  08/01/23         Milika will identify 2 cognitive distortions they are currently using and write reframing statements to replace them     Start:  08/01/23  Coping Skills     Start:  08/01/23       Will work with the pt using CBT/DBT techniques to help the pt verbalize an understanding of the cognitive, physiological, and behavioral components of depression and its treatment. This will be done by using worksheets, interactive activities, CBT/ABC thought logs, modeling, homework, role playing and journaling. Will work with pt to learn and implement coping skills that result in a reduction of depression and improve daily functioning per pt self-report 3 out of 5 documented sessions.       Educate patient on: Stages of grief     Start:  08/01/23               ProgressTowards Goals: Progressing  Interventions: CBT, Supportive, and Reframing  Summary: KERRIGAN GOMBOS is a 58 y.o. female who presents with  anxiety, depression, and bereavement. Patient identifies symptoms including lack of motivation, low mood, trouble falling asleep, low energy, change in appetite, anxious feelings, uncontrollable worry, tension, restlessness, and irritability. Pt was oriented times 5. Pt was cooperative and engaged. Pt  denies SI/HI/AVH.     During the session, the patient reflected on her recent physical health, expressing, I have not been feeling well and have been sick to my stomach. She identified her symptoms as potentially being a complication from a previous procedure. The patient also explored the idea that these physical symptoms could be a result of anxiety stemming from complications in her relationship with her boyfriend, particularly regarding their differing communication styles.   She reflected on the characteristics of an unhealthy relationship and worked to identify possible changes. The clinician discussed controllable factors, and the patient recognized that she often feels responsible for the happiness of others. She conveyed that she is at a point where she feels ready to consider separation, noting that her health issues are impacting their relationship.  Later in the session, the patient expressed a desire to work on her anger, which she often associates with a need for revenge, as well as her communication style when she is angry. The clinician educated her about the anger iceberg concept, helping her understand that beneath her anger, she feels overwhelmed, in pain, grief-stricken, anxious, tired, frustrated, guilty, sad, and lonely.   The patient reported feeling disappointed and ashamed of these emotions, despite the clinician's efforts to normalize these responses. Additionally, the session included an assessment of the triggers for her anger.  Clinician and patient briefly explored the role of patient's unresolved grief and misplaced guilt on her current behaviors.  Patient became tearful reflecting on should statements regarding her mothers hospice care.  Clinician worked with patient on challenging these negative cognitions.  -  Suicidal/Homicidal: Nowithout intent/plan  Therapist Response:  Clinician utilized active listening to create a safe space for patient to process recent  life events.  Clinician assessed for current symptoms, stressors, safety since last session.  Educated patient on the anger iceberg and assess for underlying feelings and triggers.  Clinician utilized CBT to aid patient in reframing negative cognitions and specifically misplaced guilt.  Plan: Return again in 2 weeks.  Diagnosis: Recurrent major depressive disorder, in partial remission (HCC)  Bereavement  GAD (generalized anxiety disorder)  PTSD (post-traumatic stress disorder)   Collaboration of Care: AEB psychiatrist can access notes and cln. Will review psychiatrists' notes. Check in with the patient and will see LCSW per availability. Patient agreed with treatment recommendations.   Patient/Guardian was advised Release of Information must be obtained prior to  any record release in order to collaborate their care with an outside provider. Patient/Guardian was advised if they have not already done so to contact the registration department to sign all necessary forms in order for us  to release information regarding their care.   Consent: Patient/Guardian gives verbal consent for treatment and assignment of benefits for services provided during this visit. Patient/Guardian expressed understanding and agreed to proceed.   Evalene KATHEE Husband, LCSW 11/15/2023

## 2023-11-29 ENCOUNTER — Ambulatory Visit (INDEPENDENT_AMBULATORY_CARE_PROVIDER_SITE_OTHER): Payer: Self-pay | Admitting: Licensed Clinical Social Worker

## 2023-11-29 ENCOUNTER — Encounter: Payer: Self-pay | Admitting: Licensed Clinical Social Worker

## 2023-11-29 DIAGNOSIS — Z91199 Patient's noncompliance with other medical treatment and regimen due to unspecified reason: Secondary | ICD-10-CM

## 2023-11-29 NOTE — Progress Notes (Signed)
 Clinician attempted session via face-to-face, but Christine Mcneil did not appear for her session. Per [facility] policy, she will be charged a no show fee.

## 2023-12-04 ENCOUNTER — Other Ambulatory Visit: Payer: Self-pay | Admitting: Psychiatry

## 2023-12-04 DIAGNOSIS — G4701 Insomnia due to medical condition: Secondary | ICD-10-CM

## 2023-12-06 ENCOUNTER — Inpatient Hospital Stay: Payer: 59 | Attending: Oncology

## 2023-12-06 DIAGNOSIS — D508 Other iron deficiency anemias: Secondary | ICD-10-CM | POA: Diagnosis present

## 2023-12-06 LAB — IRON AND TIBC
Iron: 78 ug/dL (ref 28–170)
Saturation Ratios: 20 % (ref 10.4–31.8)
TIBC: 384 ug/dL (ref 250–450)
UIBC: 306 ug/dL

## 2023-12-06 LAB — CBC
HCT: 41.8 % (ref 36.0–46.0)
Hemoglobin: 13.8 g/dL (ref 12.0–15.0)
MCH: 29.9 pg (ref 26.0–34.0)
MCHC: 33 g/dL (ref 30.0–36.0)
MCV: 90.5 fL (ref 80.0–100.0)
Platelets: 177 K/uL (ref 150–400)
RBC: 4.62 MIL/uL (ref 3.87–5.11)
RDW: 12 % (ref 11.5–15.5)
WBC: 8.1 K/uL (ref 4.0–10.5)
nRBC: 0 % (ref 0.0–0.2)

## 2023-12-06 LAB — FERRITIN: Ferritin: 78 ng/mL (ref 11–307)

## 2023-12-14 ENCOUNTER — Ambulatory Visit (INDEPENDENT_AMBULATORY_CARE_PROVIDER_SITE_OTHER): Admitting: Licensed Clinical Social Worker

## 2023-12-14 DIAGNOSIS — Z634 Disappearance and death of family member: Secondary | ICD-10-CM | POA: Diagnosis not present

## 2023-12-14 DIAGNOSIS — F3341 Major depressive disorder, recurrent, in partial remission: Secondary | ICD-10-CM | POA: Diagnosis not present

## 2023-12-14 DIAGNOSIS — F411 Generalized anxiety disorder: Secondary | ICD-10-CM

## 2023-12-14 DIAGNOSIS — F4310 Post-traumatic stress disorder, unspecified: Secondary | ICD-10-CM | POA: Diagnosis not present

## 2023-12-14 NOTE — Progress Notes (Signed)
 THERAPIST PROGRESS NOTE    Session Time: 10:04am-10:48am  Participation Level: Active  Behavioral Response: CasualAlertNegative  Type of Therapy: Individual Therapy  Treatment Goals addressed:  Active     Anxiety     LTG: Lizabeth will score less than 5 on the Generalized Anxiety Disorder 7 Scale (GAD-7)      Start:  08/01/23    Expected End:  02/01/24         STG: Delesha will reduce frequency of avoidant behaviors by 50% as evidenced by self-report in therapy sessions     Start:  08/01/23    Expected End:  02/01/24         Perform psychoeducation regarding anxiety disorders     Start:  08/01/23         Work with patient individually to identify the major components of a recent episode of anxiety: physical symptoms, major thoughts and images, and major behaviors they experienced     Start:  08/01/23         Perform motivational interviewing regarding increasing desire and follow though of fishing excursions.      Start:  08/01/23         Coping Skills      Start:  08/01/23       Will work with the pt using CBT/DBT techniques to help the pt verbalize an understanding of the cognitive, physiological, and behavioral components of anxiety and its treatment. This will be done by using worksheets, interactive activities, CBT/ABC thought logs, modeling, homework, role playing and journaling. Will work with pt to learn and implement coping skills that result in a reduction of anxiety and improve daily functioning per pt self-report 3 out of 5 documented sessions.         OP Depression     LTG: Reduce frequency, intensity, and duration of depression symptoms so that daily functioning is improved     Start:  08/01/23    Expected End:  02/01/24         LTG: Increase coping skills to manage depression and improve ability to perform daily activities     Start:  08/01/23    Expected End:  02/01/24         STG: Eithel will identify cognitive patterns and beliefs that  support depression     Start:  08/01/23    Expected End:  02/01/24         STG: help me get over some of this anger towards her relatives and working towards forgiveness.      Start:  08/01/23    Expected End:  02/01/24         STG - Misc 1     Start:  08/01/23    Expected End:  02/01/24      to be more happy [fishing more, laughing, get out more, hangout with sister]      Demonstrates progress in stages of grief at own pace     Start:  08/01/23    Expected End:  02/01/24            Work with [name] to identify the major components of a recent episode of depression: physical symptoms, major thoughts and images, and major behaviors they experienced     Start:  08/01/23         Erick will identify 2 cognitive distortions they are currently using and write reframing statements to replace them     Start:  08/01/23  Coping Skills     Start:  08/01/23       Will work with the pt using CBT/DBT techniques to help the pt verbalize an understanding of the cognitive, physiological, and behavioral components of depression and its treatment. This will be done by using worksheets, interactive activities, CBT/ABC thought logs, modeling, homework, role playing and journaling. Will work with pt to learn and implement coping skills that result in a reduction of depression and improve daily functioning per pt self-report 3 out of 5 documented sessions.       Educate patient on: Stages of grief     Start:  08/01/23               ProgressTowards Goals: Progressing  Interventions: CBT, Solution Focused, Supportive, and Reframing  Summary: Christine Mcneil is a 58 y.o. female who presents with  anxiety, depression, and bereavement. Patient identifies symptoms including lack of motivation, low mood, trouble falling asleep, low energy, change in appetite, anxious feelings, uncontrollable worry, tension, restlessness, and irritability. Pt was oriented times 5. Pt was cooperative  and engaged. Pt denies SI/HI/AVH.     The patient attended today's session displaying a flat affect and tearfulness, noting an increase in difficulties with recalling and remembering information. She reflected on a recent meeting with her neurologist, expressing uncertainty about whether she has been diagnosed with the early stages of Alzheimer's disease. The patient expressed a desire to pause her therapy appointments due to concerns about her forgetfulness and the implications it may have on her attendance.  The clinician assessed how the patient is coping with this recent medical news, noting that her depression has increased. The patient also reported experiencing more frequent bouts of nausea, which have made it difficult for her to eat properly. The clinician encouraged the patient to reach out to her medical professionals to discuss her concerns about her sleeping and eating habits.  The patient mentioned that she has been sleeping more due to constant feelings of fatigue but acknowledged that her boyfriend has been providing increased support. She became tearful while reflecting on her mother's struggle with Alzheimer's and chronic pain, voicing her fears and uncertainties about her own journey.  The clinician advised the patient to contact her neurologist for clarity regarding her Alzheimer's diagnosis. Together, they identified potential strategies to address her forgetfulness, such as using sticky notes and reminders placed around her environment. The clinician also agreed to move the patient's future therapy appointments to a virtual format to improve attendance. They worked together to establish a self-care routine during this time.  Suicidal/Homicidal: Nowithout intent/plan  Therapist Response:  Clinician utilized active listening to create a safe space for patient to process recent life events.  Clinician assessed for current symptoms, stressors, safety since last session.  Clinician  encouraged communication between patient and providers to obtain clarity regarding possible recent diagnosis.  Problem solved ways in which patient can address forgetfulness and encouraged routine self-care regimen.  Plan: Return again in 2 weeks.  Diagnosis: Recurrent major depressive disorder, in partial remission (HCC)  Bereavement  GAD (generalized anxiety disorder)  PTSD (post-traumatic stress disorder)   Collaboration of Care: AEB psychiatrist can access notes and cln. Will review psychiatrists' notes. Check in with the patient and will see LCSW per availability. Patient agreed with treatment recommendations.   Patient/Guardian was advised Release of Information must be obtained prior to any record release in order to collaborate their care with an outside provider. Patient/Guardian was advised if they have  not already done so to contact the registration department to sign all necessary forms in order for us  to release information regarding their care.   Consent: Patient/Guardian gives verbal consent for treatment and assignment of benefits for services provided during this visit. Patient/Guardian expressed understanding and agreed to proceed.   Evalene KATHEE Husband, LCSW 12/14/2023

## 2023-12-27 ENCOUNTER — Other Ambulatory Visit: Payer: Self-pay | Admitting: Psychiatry

## 2023-12-27 DIAGNOSIS — F4310 Post-traumatic stress disorder, unspecified: Secondary | ICD-10-CM

## 2023-12-27 DIAGNOSIS — F411 Generalized anxiety disorder: Secondary | ICD-10-CM

## 2024-01-05 NOTE — Progress Notes (Signed)
 Christine Mcneil is 58 y.o. female seen in follow up. She was last seen on 10/04/2023.   Type 2 diabetes. Her Hb A1c is 7.9%. Her diabetes had been managed with an OmniPod 5 insulin  pump (started in 10/2022) and DexCom G6 CGM. She explains that she stopped her insulin  pump about 2 weeks ago due to low blood sugars. She reports sugars dropped into the 60s. She restarted her insulin  pump 2-3 days ago. While not wearing pump it seems she was taking occasional pre-meal NovoLog  only, no long-acting insulin . She stopped Rybelsus about 3 weeks ago when she had nausea. She reports ongoing nausea. Her pump and CGM are not sync'd as she does not have a compatible smart phone (has a non-compatible android phone).  Her devices were downloaded and reviewed. Pump settings: Basal rate 1.8 units / hr   43.2 units of U200 insulin  (equivalent to 62.4 units of U100 insulin )  Bolus settings: Sensitivity  25 Insulin : carb ratio 1:7 BG target 120-120 Active insulin  time 4 hours  Over last 3 days, her pump has delivered on avg 37 units of U200 insulin  per day (equals 74 units of U100 insulin ), as 81% basal and 19% bolus. She boluses on avg 1.6 times daily. Pump is in manual mode all the time. On the DexCom CGM, her 2 week blood glucose average was 209 mg/dl. She had 39% of readings in target range, 36% high, 25% very high and 0% low. Days with CGM data was 100% (14 / 14). Pattern shows that sugars are consistently high day and night, with highest sugars from 6:00pm - 10:00pm. She is not bolusing before meals, which has been a chronic issue.  She draws up 2ml of U200 insulin  from the Humalog pen and fills a pod q3 days. She uses one box (6 ml) of insulin  every 9 days. Each month she requires 3 1/3 box of pens. Each 90 days she requires 10 boxes or 60 ml of insulin .  Diabetes complications. Her diabetes is complicated by obesity, vascular disease and peripheral neuropathy. Last eye exam was on 10/04/2023.   Prior medications  for diabetes: glipizide  - stopped due to hypoglycemia Jardiance - insurer would not pay for Farxiga - stopped after vaginal yeast infection. Januvia - stopped due to ineffective (11/2018) Ozempic - stopped after one dose (09/2019) due to nausea and abdominal pain. Victoza - stopped due to intolerable nausea [place] -  stopped due to hyperglycemia   Pioglitazone - stopped 03/2023 in setting of fatty liver, obesity  Hyperlipidemia. Continues to take a statin.    MASLD with F3 fibrosis. She had a fibroscan at [facility] on 04/15/2023. She does not have cirrhosis. She follows at the [facility] clinic. The recommendation is US  every 6 months or CT/MRI annually along with AFP every 6 months. She had a CT ap 11/2022 without any liver abnormality. She has due for follow up this month, however that clinic cancelled her appt and she was rescheduled to 03/2024.  Obesity, class 2. BMI is 39.4 kg/m2. Obesity complicated by OSA, MASLD, DM2, hypertension, hyperlipidemia, vascular disease. Weight is down 6 lbs from last visit. She does not exercise. Diet is poor and she reports she drinks regular soda and lately has been eating potatoes mostly as she has poor PO tolerance for much else.  Hypothyroidism.  Managed by primary care provider.   Nausea/dyspepsia. Worse in last month. She has appt to see Dr Unk, GI at [facility] in 03/2024. She requests assistance to move up  that appt as she feels so poorly.     Past Medical History:  Diagnosis Date  . Allergy amocillin   keflex  . Anemia   . Anxiety   . Aortic atherosclerosis ()    on CT scan 09/2019 (from [facility])  . Arrhythmia 2018  . Arthritis   . ASCVD (arteriosclerotic cardiovascular disease)    s/p cardiac cath 03/14/2022: 50% mid LAD stenosis  . Awareness under anesthesia    cannot recall which procedure - was awake and in pain  . B12 deficiency   . Bleeding disorder () 2015  . Cardiomyopathy, secondary (CMS/HHS-HCC) 2018  . Cervical disc disease 04/2011   Pt  is having injections by a pain physician  . Clotting disorder (CMS/HHS-HCC)    Prothrombin gene mutation, Anti-cardiolipin antibody positive - IgM  . COPD (chronic obstructive pulmonary disease) (CMS/HHS-HCC) 2018   Admitted to hospital  . Depression   . Fatty liver disease, nonalcoholic   . Fibromyalgia 2004  . Fibromyalgia muscle pain    chronic pain  . GERD (gastroesophageal reflux disease)   . Hyperlipidemia   . Hypertension   . Hypotension 2008  . Hypothyroidism   . IBS (irritable bowel syndrome)   . Idiopathic intracranial hypertension   . Low back pain 2004  . Migraine   . Neck pain 2010  . Neuropathy   . Obesity, unspecified   . Pancreatitis (HHS-HCC) 1992  . Paroxysmal atrial fibrillation (CMS/HHS-HCC)   . Pelvic cramping 1992  . PONV (postoperative nausea and vomiting)    with most surgeries - responds well to premedication  . Poor intravenous access   . Pseudotumor cerebri   . Rheumatoid arthritis (CMS/HHS-HCC)   . Sinusitis, unspecified 2008  . Skin lesions   . Sleep apnea    no CPAP  . Stroke (CMS/HHS-HCC) 05/2017   and recurrent TIA  . Thoracic outlet syndrome   . Thoracic outlet syndrome   . TIA (transient ischemic attack)   . Type 2 diabetes mellitus (CMS/HHS-HCC)   . Ventricular tachycardia (CMS/HHS-HCC) 2018  . Vision abnormalities     Outpatient Medications Marked as Taking for the 01/05/24 encounter (Office Visit) with Solum, Anna Melissa, MD  Medication Sig Dispense Refill  . albuterol  90 mcg/actuation inhaler Inhale 2 inhalations into the lungs every 4 (four) hours as needed 6.7 g 0  . atogepant 60 mg Tab Take 60 mg by mouth once daily 30 tablet 5  . atorvastatin  (LIPITOR) 80 MG tablet Take 80 mg by mouth once daily      . carboxymethylcellulose (REFRESH PLUS) 0.5 % ophthalmic solution Place 1 drop into both eyes once daily as needed    . cholecalciferol , vitamin D3, (VITAMIN D3) 125 mcg (5,000 unit) tablet Take 5,000 Units by mouth once daily.     . clotrimazole (LOTRIMIN) 1 % cream Apply topically as needed    . dabigatran  (PRADAXA ) 150 mg capsule Take 1 capsule (150 mg total) by mouth 2 (two) times daily 180 capsule 3  . DEXCOM G6 TRANSMITTER Devi USE TO MONITOR BLOOD GLUCOSE - REPLACE EVERY 3 MONTHS 1 each 3  . dicyclomine  (BENTYL ) 20 mg tablet 20 mg 3 (three) times daily as needed    . dilTIAZem  (CARDIZEM  CD) 240 MG CD capsule Take 1 capsule (240 mg total) by mouth once daily 90 capsule 3  . dilTIAZem  (DILT-XR) 240 MG XR capsule TAKE (1) CAPSULE BY MOUTH EVERY DAY 90 capsule 3  . esomeprazole (NEXIUM) 40 MG DR capsule Take 40  mg by mouth nightly       . ezetimibe (ZETIA) 10 mg tablet Take 10 mg by mouth once daily    . famotidine  (PEPCID ) 40 MG tablet Take 40 mg by mouth once daily       . ferrous sulfate 325 (65 FE) MG tablet Take 325 mg by mouth daily with breakfast    . fluticasone  propionate (FLONASE ) 50 mcg/actuation nasal spray Place 1 spray into both nostrils 2 (two) times daily 16 g 0  . FUROsemide (LASIX) 40 MG tablet Take 1 tablet (40 mg total) by mouth once daily 90 tablet 3  . gabapentin  (NEURONTIN ) 300 MG capsule Take 2 capsule in AM, 1 mid day and 3 at HS 180 capsule 2  . hydrOXYzine  pamoate (VISTARIL ) 25 MG capsule TAKE (1) CAPSULE BY MOUTH TWICE DAILY ASNEEDED FOR SEVERA ANXIETY ATTACKS    . insulin  lispro (HUMALOG KWIKPEN INSULIN ) 200 unit/mL (3 mL) InPn Inject 125 Units subcutaneously once daily (Patient taking differently: 125 Units once daily Via pump) 60 mL 3  . lamoTRIgine  (LAMICTAL ) 100 MG tablet TAKE 1 TABLET BY MOUTH EVERY DAY 90 tablet 3  . levothyroxine  (SYNTHROID ) 112 MCG tablet Take 112 mcg by mouth once daily    . magnesium oxide (MAG-OX) 400 mg (241.3 mg magnesium) tablet Take 400 mg by mouth once daily    . melatonin 10 mg Tab Take 1 tablet by mouth at bedtime    . meloxicam (MOBIC) 15 MG tablet Take 1 tablet (15 mg total) by mouth once daily 30 tablet 2  . metFORMIN  (GLUCOPHAGE -XR) 500 MG XR tablet  Take 2 tablets (1,000 mg total) by mouth 2 (two) times daily 360 tablet 3  . methocarbamoL (ROBAXIN) 500 MG tablet Take 1 tablet (500 mg total) by mouth 2 (two) times daily as needed 60 tablet 2  . metoprolol  SUCCinate (TOPROL -XL) 25 MG XL tablet Take 1 tablet (25 mg total) by mouth once daily 60 tablet 5  . nitroGLYcerin  (NITROSTAT ) 0.4 MG SL tablet Place 0.4 mg under the tongue as needed for Chest pain    . oxyCODONE  (ROXICODONE ) 5 MG immediate release tablet Take 1 tablet (5 mg total) by mouth 2 (two) times daily as needed for Pain 60 tablet 0  . oxyCODONE  myristate (XTAMPZA ) 9 mg ER capsule Take 1 capsule (9 mg total) by mouth every 12 (twelve) hours ** Xtampza  ER should be taken with food or nutritional supplement.  Xtampza  ER should be taken with approximately the same amount of food to ensure consistent plasma concentrations.  Capsules can be opened and contents administered via NG or G tube. ** 60 capsule 0  . promethazine  (PHENERGAN ) 12.5 MG tablet Take 12.5 mg by mouth 2 (two) times daily as needed    . REXULTI  0.25 mg Tab TAKE 1 TABLET (0.25 MG TOTAL) BY MOUTH DAILY. TAKE ALONG WITH 0.5 MG DAILY , TOTAL OF 0.75 MG DAILY.    . REXULTI  0.5 mg tablet Take 0.5 mg by mouth once daily    . zaleplon  (SONATA ) 5 MG capsule TAKE 1 CAPSULE (5 MG TOTAL) BY MOUTH AT BEDTIME AS NEEDED FOR SLEEP.    SABRA zonisamide (ZONEGRAN) 100 MG capsule Take 200 mg by mouth at bedtime      Exam: Pulse 91   Ht 167.6 cm (5' 6)   Wt (!) 110.7 kg (244 lb)   LMP  (LMP Unknown)   SpO2 94%   BMI 39.38 kg/m  GEN: well developed female in NAD. SKIN:  A bilateral bare foot exam shows no ulcerations or calluses.   EXT: No peripheral edema PSYC: alert and oriented, good insight.  Labs Lab Results  Component Value Date   HGBA1C 7.9 (H) 01/05/2024   HGBA1C 7.6 (H) 10/04/2023    Assessment: 1. Type 2 diabetes mellitus with vascular disease (CMS/HHS-HCC)   2. Type 2 diabetes mellitus with hyperlipidemia  (CMS/HHS-HCC)    3. DM type 2 with diabetic peripheral neuropathy (CMS/HHS-HCC)   4. Insulin  pump in place   5. Metabolic dysfunction-associated steatotic liver disease (MASLD)   6. Mixed hyperlipidemia   7. Obesity, Class II, BMI 35-39.9   8. Nausea     Plan: * She would benefit from a closed loop insulin  pump. She could continue to use her OmniPod 5 insulin  pump but would need to change to the [place] 2plus sensors. Will set her up to make this change with the OmniPod CDE. Ordered Lib[place] 2plus sensors for her today. * She would benefit from continuous use of pump and CGM. Encouraged her in future to call me if she has concerns about hyper- or hypoglycemia.  * Reminded her that if she is not using an insulin  pump, then it would need to be substituted with a long-acting insulin  (eg lantus, [place], [employer], petroleum) + a short-acting premeal insulin .  * Continue use of OmniPod 5 pump. No setting changes were made today.  * Continue use of Humalog U200 insuln. She can not use an alternative insulin  with current pump settings. * Continue metformin .  * Will not restart Rybelsus at this time due to her nausea and dyspepsia.  * Message sent to [facility] requesting sooner appt. She has already tried calling that clinic and has been unable to move up her appt.  * Encouraged a low carb diet, reduced portions, reduced caloric intake, increased daily activity and and weight loss. Counseled on benefits of avoiding fast food. Advised her to stop all regular soda. Cut down on white carbohydrates such as potatoes. Counseled on healthy low carb options to eat. * Continue statin. Continue ARB.  * Continue annual eye exams.   * Follow up in 12 weeks or sooner if needed.   I spent a total of 47 minutes in both face-to-face and non-face-to-face activities, excluding procedures performed, for this visit on the date of this encounter.

## 2024-01-10 ENCOUNTER — Other Ambulatory Visit: Payer: Self-pay | Admitting: Medical Genetics

## 2024-01-10 ENCOUNTER — Telehealth (INDEPENDENT_AMBULATORY_CARE_PROVIDER_SITE_OTHER): Admitting: Psychiatry

## 2024-01-10 ENCOUNTER — Encounter: Payer: Self-pay | Admitting: Psychiatry

## 2024-01-10 DIAGNOSIS — Z634 Disappearance and death of family member: Secondary | ICD-10-CM

## 2024-01-10 DIAGNOSIS — G4701 Insomnia due to medical condition: Secondary | ICD-10-CM

## 2024-01-10 DIAGNOSIS — F411 Generalized anxiety disorder: Secondary | ICD-10-CM | POA: Diagnosis not present

## 2024-01-10 DIAGNOSIS — F3342 Major depressive disorder, recurrent, in full remission: Secondary | ICD-10-CM

## 2024-01-10 DIAGNOSIS — F4310 Post-traumatic stress disorder, unspecified: Secondary | ICD-10-CM

## 2024-01-10 MED ORDER — HYDROXYZINE PAMOATE 25 MG PO CAPS: 25.0000 mg | ORAL_CAPSULE | Freq: Two times a day (BID) | ORAL | 0 refills | Status: DC | PRN

## 2024-01-10 NOTE — Progress Notes (Unsigned)
 Virtual Visit via Video Note  I connected with Christine Mcneil on 01/10/24 at 11:00 AM EDT by a video enabled telemedicine application and verified that I am speaking with the correct person using two identifiers.  Location Provider Location : [facility] Patient Location : Home  Participants: Patient , Provider  I discussed the limitations of evaluation and management by telemedicine and the availability of in person appointments. The patient expressed understanding and agreed to proceed.  I discussed the assessment and treatment plan with the patient. The patient was provided an opportunity to ask questions and all were answered. The patient agreed with the plan and demonstrated an understanding of the instructions.   The patient was advised to call back or seek an in-person evaluation if the symptoms worsen or if the condition fails to improve as anticipated.   BH MD OP Progress Note  01/10/2024 11:15 AM SAVANNHA WELLE  MRN:  [id number]  Chief Complaint:  Chief Complaint  Patient presents with   Follow-up   Depression   Anxiety   Medication Refill   Discussed the use of AI scribe software for clinical note transcription with the patient, who gave verbal consent to proceed.  History of Present Illness Christine Mcneil is a 58 year old Caucasian female, divorced, lives in  [place], has a history of MDD, PTSD, GAD, insomnia, history of CVA, history of prothrombin gene mutation, chronic migraine headaches, B12 deficiency, iron  deficiency was evaluated by telemedicine today.  She reports that her depression and PTSD remain stable. She reports that coping with her grief has improved compared to previous visits. She denies experiencing sadness or hopelessness at this time. Ongoing family stressors, particularly conflict involving her sister, have resulted in family arguments and emotional distress. She notes that these issues continue to keep her sister upset and contribute to a stressful  environment.  She continues to find sleep challenging, as she describes difficulty shutting her mind down and estimates she gets approximately 4 hours of sleep per night. Her current medications include Sonata  5 mg for sleep, sertraline  200 mg, and Rexulti  0.75 mg. She has not started using a CPAP device because she has not received it yet.. She continues to engage in therapy with Ms.Kristina, her therapist.  She denies suicidal ideation and denies thoughts of harming others.    Visit Diagnosis:    ICD-10-CM   1. Recurrent major depressive disorder, in full remission (HCC)  F33.42     2. PTSD (post-traumatic stress disorder)  F43.10     3. GAD (generalized anxiety disorder)  F41.1 hydrOXYzine  (VISTARIL ) 25 MG capsule    4. Insomnia due to medical condition  G47.01    Mood symptoms, uncorrected sleep apnea    5. Bereavement  Z63.4       Past Psychiatric History: I have reviewed past psychiatric history from progress note: 07/19/2017.  Past trials of medications like Effexor, Klonopin , mirtazapine , trazodone , Belsomra , lemborexant -unable to afford.  Past Medical History:  Past Medical History:  Diagnosis Date   Abdominal pain    Anxiety and depression    Atypical facial pain    Bilateral occipital neuralgia    Cervical spondylosis without myelopathy    Chronic daily headache    Chronic pain    Chronic pelvic pain in female    Chronic thoracic back pain    Depression    Diabetes mellitus without complication (HCC)    Diabetes mellitus, type II (HCC)    Dysphagia    Dysrhythmia  Fibromyalgia    Hyperlipidemia    Hypertension    Hypothyroidism    Insomnia    Iron  deficiency anemia    Left hip pain    Low back pain    Migraines    Numbness    Optic neuropathy    OSA (obstructive sleep apnea)    Polyarthralgia    Primary osteoarthritis of both hips    Prothrombin gene mutation (HCC)    Pseudotumor cerebri    Rectal bleeding    Right shoulder pain    Rotator cuff  syndrome    Stroke [facility])    Thyroid disease    TIA (transient ischemic attack) 05/27/2017    Past Surgical History:  Procedure Laterality Date   ABDOMINAL HYSTERECTOMY     total   CHOLECYSTECTOMY     COLONOSCOPY WITH PROPOFOL  N/A 03/21/2018   Procedure: COLONOSCOPY WITH PROPOFOL ;  Surgeon: Jinny Carmine, MD;  Location: [facility] ENDOSCOPY;  Service: Endoscopy;  Laterality: N/A;   ERCP N/A 08/19/2020   Procedure: ENDOSCOPIC RETROGRADE CHOLANGIOPANCREATOGRAPHY (ERCP);  Surgeon: Jinny Carmine, MD;  Location: [facility] ENDOSCOPY;  Service: Endoscopy;  Laterality: N/A;   ESOPHAGOGASTRODUODENOSCOPY (EGD) WITH PROPOFOL  N/A 03/21/2018   Procedure: ESOPHAGOGASTRODUODENOSCOPY (EGD) WITH PROPOFOL ;  Surgeon: Jinny Carmine, MD;  Location: [facility] ENDOSCOPY;  Service: Endoscopy;  Laterality: N/A;   GIVENS CAPSULE STUDY N/A 09/24/2019   Procedure: GIVENS CAPSULE STUDY;  Surgeon: Janalyn Keene NOVAK, MD;  Location: [facility] ENDOSCOPY;  Service: Endoscopy;  Laterality: N/A;   KNEE SURGERY Left    LEFT HEART CATH AND CORONARY ANGIOGRAPHY N/A 03/10/2022   Procedure: LEFT HEART CATH AND CORONARY ANGIOGRAPHY;  Surgeon: Florencio Cara BIRCH, MD;  Location: [facility] INVASIVE CV LAB;  Service: Cardiovascular;  Laterality: N/A;   ROTATOR CUFF REPAIR Right    spinal neurostimulator      Family Psychiatric History: I have reviewed family psychiatric history from progress note on 07/19/2017.  Family History:  Family History  Problem Relation Age of Onset   Diabetes Mother        Mother passed away on 05-29-2023.   Hyperlipidemia Mother    Hypertension Mother    COPD Mother    CVA Mother    Anemia Mother    Diabetes Father    Hyperlipidemia Father    Hypertension Father    Thyroid disease Father    Alcohol abuse Father    Peripheral vascular disease Sister    Hypertension Sister    Anxiety disorder Son    Depression Son    Bipolar disorder Son    Seizures Son     Social History: I have reviewed social history from progress  note on 07/19/2017. Social History   Socioeconomic History   Marital status: Divorced    Spouse name: Not on file   Number of children: 1   Years of education: Not on file   Highest education level: High school graduate  Occupational History    Comment: disablitiy  Tobacco Use   Smoking status: Never   Smokeless tobacco: Never  Vaping Use   Vaping status: Never Used  Substance and Sexual Activity   Alcohol use: No   Drug use: No   Sexual activity: Not Currently  Other Topics Concern   Not on file  Social History Narrative   Not on file   Social Drivers of Health   Financial Resource Strain: Low Risk  (10/11/2023)   Received from [facility]   Overall Financial Resource Strain (CARDIA)    Difficulty  of Paying Living Expenses: Not hard at all  Food Insecurity: No Food Insecurity (10/11/2023)   Received from [facility]   Hunger Vital Sign    Within the past 12 months, you worried that your food would run out before you got the money to buy more.: Never true    Within the past 12 months, the food you bought just didn't last and you didn't have money to get more.: Never true  Transportation Needs: No Transportation Needs (10/11/2023)   Received from [facility] - Transportation    In the past 12 months, has lack of transportation kept you from medical appointments or from getting medications?: No    Lack of Transportation (Non-Medical): No  Physical Activity: Inactive (07/19/2017)   Exercise Vital Sign    Days of Exercise per Week: 0 days    Minutes of Exercise per Session: 0 min  Stress: Stress Concern Present (07/19/2017)   [employer] of Occupational Health - Occupational Stress Questionnaire    Feeling of Stress : Very much  Social Connections: Moderately Isolated (07/19/2017)   Social Connection and Isolation Panel    Frequency of Communication with Friends and Family: Once a week    Frequency of Social  Gatherings with Friends and Family: Never    Attends Religious Services: More than 4 times per year    Active Member of [employer] or Organizations: No    Attends Banker Meetings: Never    Marital Status: Divorced    Allergies:  Allergies  Allergen Reactions   Semaglutide Other (See Comments)    Patient became sick, dehydrated, had to go to hospital--couldn't tolerate  semaglutide   Amoxapine Itching   Amoxicillin Itching   Dapagliflozin Itching    Other reaction(s): Other (See Comments) Thrush & vaginal itching   Keflex [Cephalexin] Itching   Mirtazapine  Other (See Comments)    Pt states felling like my body is outside of me.    Metabolic Disorder Labs: Lab Results  Component Value Date   HGBA1C 9.9 (H) 06/21/2017   MPG 237.43 06/21/2017   Lab Results  Component Value Date   PROLACTIN 4.3 (L) 05/26/2021   PROLACTIN 5.5 09/06/2017   Lab Results  Component Value Date   CHOL 161 02/22/2023   TRIG 294 (H) 02/22/2023   HDL 32 (L) 05/26/2021   CHOLHDL 4.9 (H) 05/26/2021   VLDL 39 06/21/2017   LDLCALC 84 05/26/2021   LDLCALC 174 (H) 06/21/2017   Lab Results  Component Value Date   TSH 2.890 09/06/2017   TSH 3.176 01/04/2017    Therapeutic Level Labs: No results found for: LITHIUM No results found for: VALPROATE No results found for: CBMZ  Current Medications: Current Outpatient Medications  Medication Sig Dispense Refill   diltiazem  (CARDIZEM  CD) 240 MG 24 hr capsule Take 240 mg by mouth daily.     metoCLOPramide  (REGLAN ) 10 MG tablet METOCLOPRAMIDE  HCL 10 MG TABS     promethazine  (PHENERGAN ) 12.5 MG tablet Take 12.5 mg by mouth.     ACCU-CHEK AVIVA PLUS test strip      ACCU-CHEK SOFTCLIX LANCETS lancets      Acetaminophen  (TYLENOL  ARTHRITIS EXT RELIEF PO) Take 600 mg by mouth daily.     albuterol  (VENTOLIN  HFA) 108 (90 Base) MCG/ACT inhaler Inhale 2 puffs into the lungs every 6 (six) hours as needed for wheezing or shortness of breath. 1 g  0   atorvastatin  (LIPITOR) 80 MG tablet Take 80  mg by mouth daily at 6 PM.     Blood Glucose Monitoring Suppl (ACCU-CHEK AVIVA PLUS) w/Device KIT      brexpiprazole  (REXULTI ) 0.25 MG TABS tablet Take 1 tablet (0.25 mg total) by mouth daily. Take along with 0.5 mg daily , total of 0.75 mg daily. 90 tablet 1   Brexpiprazole  (REXULTI ) 0.5 MG TABS Take 1 tablet (0.5 mg total) by mouth daily. Take along with 0.25 mg , total of 0.75 mg daily 90 tablet 1   Carboxymethylcellulose Sod PF 0.5 % SOLN Apply 1 drop to eye daily as needed.     Cholecalciferol  (VITAMIN D3) 1000 units CAPS Take 1 capsule by mouth daily.     clobetasol ointment (TEMOVATE) 0.05 % Apply topically.     clotrimazole (LOTRIMIN) 1 % cream Apply 1 Application topically.     Continuous Blood Gluc Receiver (DEXCOM G6 RECEIVER) DEVI Use to monitor blood sugar. NDC [phone]     Continuous Blood Gluc Sensor (DEXCOM G6 SENSOR) MISC Use to monitor blood sugar.  Replace every 10 days. NDC [phone].     Continuous Blood Gluc Transmit (DEXCOM G6 TRANSMITTER) MISC Use to monitor blood sugar.  Replace every 3 months. NDC [address]-01.     dabigatran  (PRADAXA ) 150 MG CAPS capsule Take 150 mg by mouth 2 (two) times daily.     dicyclomine  (BENTYL ) 20 MG tablet TAKE 1 TABLET (20 MG TOTAL) BY MOUTH 3 (THREE) TIMES DAILY BEFORE MEALS. 270 tablet 0   DILT-XR 240 MG 24 hr capsule Take 240 mg by mouth daily.     DULoxetine  (CYMBALTA ) 20 MG capsule Take 20 mg by mouth daily.     esomeprazole (NEXIUM) 40 MG capsule Take 40 mg by mouth daily.     ezetimibe (ZETIA) 10 MG tablet Take 10 mg by mouth at bedtime.     famotidine  (PEPCID ) 40 MG tablet Take 40 mg by mouth at bedtime.     ferrous sulfate 325 (65 FE) MG tablet Take by mouth. Take 325 mg daily with breakfast     fexofenadine (ALLEGRA ALLERGY) 180 MG tablet Take 180 mg by mouth daily.     fluticasone  (FLONASE ) 50 MCG/ACT nasal spray Place 1 spray into both nostrils daily. 9.9 mL 0    furosemide (LASIX) 40 MG tablet TAKE (1) TABLET BY MOUTH EVERY DAY     gabapentin  (NEURONTIN ) 300 MG capsule TAKE 2 CAPSULE IN AM, 1 CAP MID DAY AND 3 CAP BY MOUTH AT BEDTIME     hydrOXYzine  (VISTARIL ) 25 MG capsule Take 1-2 capsules (25-50 mg total) by mouth 2 (two) times daily as needed for anxiety (and sleep). 360 capsule 0   Insulin  Disposable Pump (OMNIPOD 5 DEXG7G6 PODS GEN 5) MISC SMARTSIG:1 SUB-Q Every Other Day     insulin  lispro (HUMALOG KWIKPEN) 200 UNIT/ML KwikPen Inject 125 Units into the skin.     Insulin  Pen Needle (TRUEPLUS PEN NEEDLES) 29G X MISC      lamoTRIgine  (LAMICTAL ) 100 MG tablet Take 100 mg by mouth daily.     levothyroxine  (SYNTHROID ) 112 MCG tablet Take 112 mcg by mouth daily.     lidocaine  4 % 1 patch daily.     magnesium oxide (MAG-OX) 400 MG tablet Take 1 tablet by mouth daily.     Melatonin 10 MG TABS Take 2 tablets by mouth at bedtime.     meloxicam (MOBIC) 15 MG tablet Take 15 mg by mouth daily.     metFORMIN  (GLUCOPHAGE -XR) 500 MG 24 hr  tablet Take 500 mg by mouth 2 (two) times daily.      methocarbamol (ROBAXIN) 500 MG tablet Take 500 mg by mouth 2 (two) times daily as needed.     metoprolol  succinate (TOPROL -XL) 25 MG 24 hr tablet Take 1 tablet by mouth daily.     mometasone (ELOCON) 0.1 % cream Apply topically.     naloxone (NARCAN) nasal spray 4 mg/0.1 mL Place into the nose.     nitroGLYCERIN  (NITROSTAT ) 0.4 MG SL tablet Place under the tongue.     nystatin (MYCOSTATIN) 100000 UNIT/ML suspension Take 5 mLs by mouth 4 (four) times daily.     omeprazole  (PRILOSEC) 20 MG capsule TAKE (1) CAPSULE BY MOUTH EVERY DAY 30 capsule 0   oxyCODONE  (OXY IR/ROXICODONE ) 5 MG immediate release tablet Take 5 mg by mouth 2 (two) times daily as needed.     QULIPTA 60 MG TABS Take 60 mg by mouth daily.     RYBELSUS 7 MG TABS Take 1 tablet by mouth daily.     sertraline  (ZOLOFT ) 100 MG tablet TAKE 2 TABLETS (200 MG TOTAL) BY MOUTH DAILY WITH BREAKFAST. 180 tablet 0    TIADYLT  ER 240 MG 24 hr capsule Take 240 mg by mouth daily.     vitamin B-12 (CYANOCOBALAMIN ) 100 MCG tablet Take 1 tablet (100 mcg total) by mouth daily. 90 tablet 1   Vitamin D , Ergocalciferol , 50 MCG (2000 UT) CAPS TAKE 1 CAPSULE (50,000 UNITS TOTAL) BY MOUTH ONCE A WEEK FOR 56 DAYS     XTAMPZA  ER 9 MG C12A PLEASE SEE ATTACHED FOR DETAILED DIRECTIONS     zaleplon  (SONATA ) 5 MG capsule TAKE 1 CAPSULE (5 MG TOTAL) BY MOUTH AT BEDTIME AS NEEDED FOR SLEEP. 30 capsule 3   No current facility-administered medications for this visit.   Facility-Administered Medications Ordered in Other Visits  Medication Dose Route Frequency Provider Last Rate Last Admin   iohexol  (OMNIPAQUE ) 300 MG/ML solution    PRN Callwood, Dwayne D, MD   60 mL at 03/10/22 1442     Musculoskeletal: Strength & Muscle Tone: UTA Gait & Station: Seated Patient leans: N/A  Psychiatric Specialty Exam: Review of Systems  Psychiatric/Behavioral:  Positive for sleep disturbance. The patient is nervous/anxious.     There were no vitals taken for this visit.There is no height or weight on file to calculate BMI.  General Appearance: Casual  Eye Contact:  Fair  Speech:  Clear and Coherent  Volume:  Normal  Mood:  Anxious  Affect:  Appropriate  Thought Process:  Goal Directed and Descriptions of Associations: Intact  Orientation:  Full (Time, Place, and Person)  Thought Content: Logical   Suicidal Thoughts:  No  Homicidal Thoughts:  No  Memory:  Immediate;   Fair Recent;   Fair Remote;   Fair  Judgement:  Fair  Insight:  Fair  Psychomotor Activity:  Normal  Concentration:  Concentration: Fair and Attention Span: Fair  Recall:  [employer] of Knowledge: Fair  Language: Fair  Akathisia:  No  Handed:  Right  AIMS (if indicated): not done  Assets:  Communication Skills Desire for Improvement Housing Social Support Transportation  ADL's:  Intact  Cognition: WNL  Sleep:  varies   Screenings: Retail banker Office Visit from 10/10/2023 in [facility] Office Visit from 05/09/2023 in [facility] Office Visit from 03/23/2023 in [facility] Office Visit from  02/21/2023 in [facility] Office Visit from 01/26/2023 in [facility]  AIMS Total Score 0 0 0 0 0   GAD-7    Flowsheet Row Counselor from 08/01/2023 in [facility] Office Visit from 05/09/2023 in [facility] Office Visit from 03/23/2023 in [facility] Office Visit from 02/21/2023 in [facility] Office Visit from 01/26/2023 in [facility]  Total GAD-7 Score 10 11 11 19 18    PHQ2-9    Flowsheet Row Office Visit from 10/10/2023 in [facility] Counselor from 08/01/2023 in [facility] Office Visit from 05/09/2023 in [facility] Office Visit from 03/23/2023 in [facility] Office Visit from 02/21/2023 in [facility]  PHQ-2 Total Score 2 4 3 5 6   PHQ-9 Total Score 9 10 11 12 15    Flowsheet Row Video Visit from 01/10/2024 in [facility] Office Visit from 10/10/2023 in [facility] ED from 10/07/2023 in [facility] Emergency Department at [facility]  C-SSRS RISK CATEGORY Moderate Risk Moderate Risk No Risk     Assessment and Plan: STORY CONTI is a 58 year old Caucasian female who has a history of depression, PTSD, insomnia, bereavement was evaluated by telemedicine today.   Discussed assessment and plan as noted below.  Insomnia-unstable Sleep continues to be interrupted mostly due to uncorrected sleep apnea and racing thoughts. Encouraged to follow up with pulmonologist for CPAP device. Encouraged to continue sleep hygiene techniques Continue Sonata  5 mg at bedtime as needed. Will reevaluate after starting CPAP for further medication changes.  MDD in remission Currently denies any significant depression symptoms. Continue Sertraline  200 mg daily Continue Rexulti  0.75 mg daily Discussed drug to drug interaction between Rexulti  and metoclopramide .  PTSD-improving Currently reports therapy is beneficial for PTSD symptoms.  Motivated to stay in therapy Continue Sertraline  as prescribed Continue CBT with Ms. Perkins.  GAD-improving Reports anxiety symptoms are better managed on the current medication regimen. Continue Hydroxyzine  25 to 50 mg twice a day as needed Continue Sertraline  200 mg daily.  Bereavement-in remission Currently reports good social support system and is managing grief better. Continue psychotherapy sessions  Follow-up Follow-up in clinic in 3 months or sooner if needed.   Collaboration of Care: Collaboration of Care: Referral or follow-up with counselor/therapist AEB encouraged to stay in therapy  Patient/Guardian was advised Release of Information must be obtained prior to any record release in order to collaborate their care with an outside provider. Patient/Guardian was advised if they have not already done so to contact the registration department to sign all necessary forms in order for us  to release information regarding their care.   Consent: Patient/Guardian gives verbal consent for treatment and assignment of benefits for services provided during this visit. Patient/Guardian expressed understanding and agreed to proceed.  This note was generated in part or whole with voice recognition software. Voice recognition is usually  quite accurate but there are transcription errors that can and very often do occur. I apologize for any typographical errors that were not detected and corrected.     Nghia Mcentee, MD 01/11/2024, 5:45 PM

## 2024-01-12 ENCOUNTER — Ambulatory Visit (INDEPENDENT_AMBULATORY_CARE_PROVIDER_SITE_OTHER): Admitting: Internal Medicine

## 2024-01-12 ENCOUNTER — Encounter: Payer: Self-pay | Admitting: Internal Medicine

## 2024-01-12 VITALS — BP 100/70 | HR 70 | Temp 98.4°F | Ht 66.0 in | Wt 243.2 lb

## 2024-01-12 DIAGNOSIS — J45909 Unspecified asthma, uncomplicated: Secondary | ICD-10-CM | POA: Insufficient documentation

## 2024-01-12 DIAGNOSIS — J4520 Mild intermittent asthma, uncomplicated: Secondary | ICD-10-CM

## 2024-01-12 DIAGNOSIS — Z6839 Body mass index (BMI) 39.0-39.9, adult: Secondary | ICD-10-CM | POA: Diagnosis not present

## 2024-01-12 DIAGNOSIS — G4733 Obstructive sleep apnea (adult) (pediatric): Secondary | ICD-10-CM | POA: Diagnosis not present

## 2024-01-12 NOTE — Progress Notes (Deleted)
 [facility] Consultation      Date: 01/12/2024,   MRN# [id number] Christine Mcneil Sep 06, 1965  Christine Mcneil is a 58 y.o. old female seen in consultation for *** at the request of ***.     CHIEF COMPLAINT:      HISTORY OF PRESENT ILLNESS      PAST MEDICAL HISTORY   Past Medical History:  Diagnosis Date   Abdominal pain    Anxiety and depression    Atypical facial pain    Bilateral occipital neuralgia    Cervical spondylosis without myelopathy    Chronic daily headache    Chronic pain    Chronic pelvic pain in female    Chronic thoracic back pain    Depression    Diabetes mellitus without complication (HCC)    Diabetes mellitus, type II (HCC)    Dysphagia    Dysrhythmia    Fibromyalgia    Hyperlipidemia    Hypertension    Hypothyroidism    Insomnia    Iron  deficiency anemia    Left hip pain    Low back pain    Migraines    Numbness    Optic neuropathy    OSA (obstructive sleep apnea)    Polyarthralgia    Primary osteoarthritis of both hips    Prothrombin gene mutation (HCC)    Pseudotumor cerebri    Rectal bleeding    Right shoulder pain    Rotator cuff syndrome    Stroke [facility])    Thyroid disease    TIA (transient ischemic attack) 05/27/2017     SURGICAL HISTORY   Past Surgical History:  Procedure Laterality Date   ABDOMINAL HYSTERECTOMY     total   CHOLECYSTECTOMY     COLONOSCOPY WITH PROPOFOL  N/A 03/21/2018   Procedure: COLONOSCOPY WITH PROPOFOL ;  Surgeon: Jinny Carmine, MD;  Location: [facility] ENDOSCOPY;  Service: Endoscopy;  Laterality: N/A;   ERCP N/A 08/19/2020   Procedure: ENDOSCOPIC RETROGRADE CHOLANGIOPANCREATOGRAPHY (ERCP);  Surgeon: Jinny Carmine, MD;  Location: [facility] ENDOSCOPY;  Service: Endoscopy;  Laterality: N/A;   ESOPHAGOGASTRODUODENOSCOPY (EGD) WITH PROPOFOL  N/A 03/21/2018   Procedure: ESOPHAGOGASTRODUODENOSCOPY (EGD) WITH PROPOFOL ;  Surgeon: Jinny Carmine, MD;  Location: [facility] ENDOSCOPY;  Service: Endoscopy;   Laterality: N/A;   GIVENS CAPSULE STUDY N/A 09/24/2019   Procedure: GIVENS CAPSULE STUDY;  Surgeon: Janalyn Keene NOVAK, MD;  Location: [facility] ENDOSCOPY;  Service: Endoscopy;  Laterality: N/A;   KNEE SURGERY Left    LEFT HEART CATH AND CORONARY ANGIOGRAPHY N/A 03/10/2022   Procedure: LEFT HEART CATH AND CORONARY ANGIOGRAPHY;  Surgeon: Florencio Cara BIRCH, MD;  Location: [facility] INVASIVE CV LAB;  Service: Cardiovascular;  Laterality: N/A;   ROTATOR CUFF REPAIR Right    spinal neurostimulator       FAMILY HISTORY   Family History  Problem Relation Age of Onset   Diabetes Mother        Mother passed away on 26-May-2023.   Hyperlipidemia Mother    Hypertension Mother    COPD Mother    CVA Mother    Anemia Mother    Diabetes Father    Hyperlipidemia Father    Hypertension Father    Thyroid disease Father    Alcohol abuse Father    Peripheral vascular disease Sister    Hypertension Sister    Anxiety disorder Son    Depression Son    Bipolar disorder Son    Seizures Son      SOCIAL HISTORY   Social History  Tobacco Use   Smoking status: Never   Smokeless tobacco: Never  Vaping Use   Vaping status: Never Used  Substance Use Topics   Alcohol use: No   Drug use: No     MEDICATIONS    Home Medication:  Current Outpatient Rx   Order #: [id number] Class: Historical Med   Order #: [id number] Class: Historical Med   Order #: [id number] Class: Historical Med   Order #: [id number] Class: Normal   Order #: [id number] Class: Historical Med   Order #: [id number] Class: Historical Med   Order #: [id number] Class: Normal   Order #: [id number] Class: Normal   Order #: [id number] Class: Historical Med   Order #: [id number] Class: Historical Med   Order #: [id number] Class: Historical Med   Order #: [id number] Class: Historical Med   Order #: [id number] Class: Historical Med   Order #: [id number] Class: Historical Med   Order #: [id number] Class: Historical Med   Order #: [id number] Class: Historical Med    Order #: [id number] Class: Normal   Order #: [id number] Class: Historical Med   Order #: [id number] Class: Historical Med   Order #: [id number] Class: Historical Med   Order #: [id number] Class: Historical Med   Order #: [id number] Class: Historical Med   Order #: [id number] Class: Historical Med   Order #: [id number] Class: Historical Med   Order #: [id number] Class: Historical Med   Order #: [id number] Class: Normal   Order #: [id number] Class: Historical Med   Order #: [id number] Class: Historical Med   Order #: [id number] Class: Normal   Order #: [id number] Class: Historical Med   Order #: [id number] Class: Historical Med   Order #: [id number] Class: Historical Med   Order #: [id number] Class: Historical Med   Order #: [id number] Class: Historical Med   Order #: [id number] Class: Historical Med   Order #: [id number] Class: Historical Med   Order #: [id number] Class: Historical Med   Order #: [id number] Class: Historical Med   Order #: [id number] Class: Historical Med   Order #: [id number] Class: Historical Med   Order #: [id number] Class: Historical Med   Order #: [id number] Class: Historical Med   Order #: [id number] Class: Historical Med   Order #: [id number] Class: Historical Med   Order #: [id number] Class: Historical Med   Order #: [id number] Class: Historical Med   Order #: [id number] Class: Normal   Order #: [id number] Class: Historical Med   Order #: [id number] Class: Historical Med   Order #: [id number] Class: Historical Med   Order #: [id number] Class: Historical Med   Order #: [id number] Class: Normal   Order #: [id number] Class: Historical Med   Order #: [id number] Class: Normal   Order #: [id number] Class: Historical Med   Order #: [id number] Class: Historical Med   Order #: [id number] Class: Normal    Current Medication:  Current Outpatient Medications:    ACCU-CHEK AVIVA PLUS test strip, , Disp: , Rfl:    ACCU-CHEK SOFTCLIX LANCETS lancets, , Disp: , Rfl:    Acetaminophen  (TYLENOL  ARTHRITIS EXT RELIEF PO), Take 600 mg by mouth  daily., Disp: , Rfl:    albuterol  (VENTOLIN  HFA) 108 (90 Base) MCG/ACT inhaler, Inhale 2 puffs into the lungs every 6 (six) hours as needed for wheezing or shortness of breath., Disp: 1 g, Rfl: 0   atorvastatin  (LIPITOR) 80 MG tablet, Take 80 mg by mouth daily at 6 PM., Disp: , Rfl:    Blood Glucose Monitoring Suppl (ACCU-CHEK AVIVA PLUS) w/Device KIT, , Disp: , Rfl:    brexpiprazole  (REXULTI ) 0.25 MG TABS tablet, Take 1 tablet (0.25 mg total) by mouth daily. Take along with 0.5 mg daily , total of 0.75 mg daily., Disp: 90 tablet, Rfl: 1   Brexpiprazole  (REXULTI ) 0.5 MG  TABS, Take 1 tablet (0.5 mg total) by mouth daily. Take along with 0.25 mg , total of 0.75 mg daily, Disp: 90 tablet, Rfl: 1   Carboxymethylcellulose Sod PF 0.5 % SOLN, Apply 1 drop to eye daily as needed., Disp: , Rfl:    Cholecalciferol  (VITAMIN D3) 1000 units CAPS, Take 1 capsule by mouth daily., Disp: , Rfl:    clobetasol ointment (TEMOVATE) 0.05 %, Apply topically., Disp: , Rfl:    clotrimazole (LOTRIMIN) 1 % cream, Apply 1 Application topically., Disp: , Rfl:    Continuous Blood Gluc Receiver (DEXCOM G6 RECEIVER) DEVI, Use to monitor blood sugar. NDC [id number], Disp: , Rfl:    Continuous Blood Gluc Sensor (DEXCOM G6 SENSOR) MISC, Use to monitor blood sugar.  Replace every 10 days. NDC [address]-96., Disp: , Rfl:    Continuous Blood Gluc Transmit (DEXCOM G6 TRANSMITTER) MISC, Use to monitor blood sugar.  Replace every 3 months. [facility] [address]-01., Disp: , Rfl:    dabigatran  (PRADAXA ) 150 MG CAPS capsule, Take 150 mg by mouth 2 (two) times daily., Disp: , Rfl:    dicyclomine  (BENTYL ) 20 MG tablet, TAKE 1 TABLET (20 MG TOTAL) BY MOUTH 3 (THREE) TIMES DAILY BEFORE MEALS., Disp: 270 tablet, Rfl: 0   DILT-XR 240 MG 24 hr capsule, Take 240 mg by mouth daily., Disp: , Rfl:    diltiazem  (CARDIZEM  CD) 240 MG 24 hr capsule, Take 240 mg by mouth daily., Disp: , Rfl:    DULoxetine  (CYMBALTA ) 20 MG capsule, Take 20 mg by mouth daily.,  Disp: , Rfl:    esomeprazole (NEXIUM) 40 MG capsule, Take 40 mg by mouth daily., Disp: , Rfl:    ezetimibe (ZETIA) 10 MG tablet, Take 10 mg by mouth at bedtime., Disp: , Rfl:    famotidine  (PEPCID ) 40 MG tablet, Take 40 mg by mouth at bedtime., Disp: , Rfl:    ferrous sulfate 325 (65 FE) MG tablet, Take by mouth. Take 325 mg daily with breakfast, Disp: , Rfl:    fexofenadine (ALLEGRA ALLERGY) 180 MG tablet, Take 180 mg by mouth daily., Disp: , Rfl:    fluticasone  (FLONASE ) 50 MCG/ACT nasal spray, Place 1 spray into both nostrils daily., Disp: 9.9 mL, Rfl: 0   furosemide (LASIX) 40 MG tablet, TAKE (1) TABLET BY MOUTH EVERY DAY, Disp: , Rfl:    gabapentin  (NEURONTIN ) 300 MG capsule, TAKE 2 CAPSULE IN AM, 1 CAP MID DAY AND 3 CAP BY MOUTH AT BEDTIME, Disp: , Rfl:    hydrOXYzine  (VISTARIL ) 25 MG capsule, Take 1-2 capsules (25-50 mg total) by mouth 2 (two) times daily as needed for anxiety (and sleep)., Disp: 360 capsule, Rfl: 0   Insulin  Disposable Pump (OMNIPOD 5 DEXG7G6 PODS GEN 5) MISC, SMARTSIG:1 SUB-Q Every Other Day, Disp: , Rfl:    insulin  lispro (HUMALOG KWIKPEN) 200 UNIT/ML KwikPen, Inject 125 Units into the skin., Disp: , Rfl:    Insulin  Pen Needle (TRUEPLUS PEN NEEDLES) 29G X MISC, , Disp: , Rfl:    lamoTRIgine  (LAMICTAL ) 100 MG tablet, Take 100 mg by mouth daily., Disp: , Rfl:    levothyroxine  (SYNTHROID ) 112 MCG tablet, Take 112 mcg by mouth daily., Disp: , Rfl:    lidocaine  4 %, 1 patch daily., Disp: , Rfl:    magnesium oxide (MAG-OX) 400 MG tablet, Take 1 tablet by mouth daily., Disp: , Rfl:    Melatonin 10 MG TABS, Take 2 tablets by mouth at bedtime., Disp: , Rfl:    meloxicam (MOBIC) 15 MG  tablet, Take 15 mg by mouth daily., Disp: , Rfl:    metFORMIN  (GLUCOPHAGE -XR) 500 MG 24 hr tablet, Take 500 mg by mouth 2 (two) times daily. , Disp: , Rfl:    methocarbamol (ROBAXIN) 500 MG tablet, Take 500 mg by mouth 2 (two) times daily as needed., Disp: , Rfl:    metoCLOPramide  (REGLAN ) 10  MG tablet, METOCLOPRAMIDE  HCL 10 MG TABS, Disp: , Rfl:    metoprolol  succinate (TOPROL -XL) 25 MG 24 hr tablet, Take 1 tablet by mouth daily., Disp: , Rfl:    mometasone (ELOCON) 0.1 % cream, Apply topically., Disp: , Rfl:    naloxone (NARCAN) nasal spray 4 mg/0.1 mL, Place into the nose., Disp: , Rfl:    nitroGLYCERIN  (NITROSTAT ) 0.4 MG SL tablet, Place under the tongue., Disp: , Rfl:    nystatin (MYCOSTATIN) 100000 UNIT/ML suspension, Take 5 mLs by mouth 4 (four) times daily., Disp: , Rfl:    omeprazole  (PRILOSEC) 20 MG capsule, TAKE (1) CAPSULE BY MOUTH EVERY DAY, Disp: 30 capsule, Rfl: 0   oxyCODONE  (OXY IR/ROXICODONE ) 5 MG immediate release tablet, Take 5 mg by mouth 2 (two) times daily as needed., Disp: , Rfl:    promethazine  (PHENERGAN ) 12.5 MG tablet, Take 12.5 mg by mouth., Disp: , Rfl:    QULIPTA 60 MG TABS, Take 60 mg by mouth daily., Disp: , Rfl:    RYBELSUS 7 MG TABS, Take 1 tablet by mouth daily., Disp: , Rfl:    sertraline  (ZOLOFT ) 100 MG tablet, TAKE 2 TABLETS (200 MG TOTAL) BY MOUTH DAILY WITH BREAKFAST., Disp: 180 tablet, Rfl: 0   TIADYLT  ER 240 MG 24 hr capsule, Take 240 mg by mouth daily., Disp: , Rfl:    vitamin B-12 (CYANOCOBALAMIN ) 100 MCG tablet, Take 1 tablet (100 mcg total) by mouth daily., Disp: 90 tablet, Rfl: 1   Vitamin D , Ergocalciferol , 50 MCG (2000 UT) CAPS, TAKE 1 CAPSULE (50,000 UNITS TOTAL) BY MOUTH ONCE A WEEK FOR 56 DAYS, Disp: , Rfl:    XTAMPZA  ER 9 MG C12A, PLEASE SEE ATTACHED FOR DETAILED DIRECTIONS, Disp: , Rfl:    zaleplon  (SONATA ) 5 MG capsule, TAKE 1 CAPSULE (5 MG TOTAL) BY MOUTH AT BEDTIME AS NEEDED FOR SLEEP., Disp: 30 capsule, Rfl: 3 No current facility-administered medications for this visit.  Facility-Administered Medications Ordered in Other Visits:    iohexol  (OMNIPAQUE ) 300 MG/ML solution, , , PRN, Callwood, Dwayne D, MD, 60 mL at 03/10/22 1442    ALLERGIES   Semaglutide, Amoxapine, Amoxicillin, Dapagliflozin, Keflex [cephalexin], and  Mirtazapine    LMP  (LMP Unknown)    Review of Systems: Gen:  Denies  fever, sweats, chills weight loss  HEENT: Denies blurred vision, double vision, ear pain, eye pain, hearing loss, nose bleeds, sore throat Cardiac:  No dizziness, chest pain or heaviness, chest tightness,edema, No JVD Resp:   No cough, -sputum production, -shortness of breath,-wheezing, -hemoptysis,  Other:  All other systems negative   Physical Examination:   General Appearance: No distress  EYES PERRLA, EOM intact.   NECK Supple, No JVD Pulmonary: normal breath sounds, No wheezing.  CardiovascularNormal S1,S2.  No m/r/g.   Abdomen: Benign, Soft, non-tender. Neurology UE/LE 5/5 strength, no focal deficits Ext pulses intact, cap refill intact ALL OTHER ROS ARE NEGATIVE      IMAGING    No results found.    ASSESSMENT/PLAN                MEDICATION ADJUSTMENTS/LABS AND TESTS ORDERED:    CURRENT  MEDICATIONS REVIEWED AT LENGTH WITH PATIENT TODAY   Patient  satisfied with Plan of action and management. All questions answered   Follow up    I spent a total of *** minutes dedicated to the care of this patient on the date of this encounter to include pre-visit review of records, face-to-face time with the patient discussing conditions above, post visit ordering of testing, clinical documentation with the electronic health record, making appropriate referrals as documented, and communicating necessary information to the patient's healthcare team.    The Patient requires high complexity decision making for assessment and support, frequent evaluation and titration of therapies, application of advanced monitoring technologies and extensive interpretation of multiple databases.  Patient satisfied with Plan of action and management. All questions answered    Nickolas Alm Cellar, M.D.  [facility]  Medical Director [facility] Medical Director [facility]  Cardio-Pulmonary Department

## 2024-01-12 NOTE — Patient Instructions (Signed)
 Referral to [facility] sleep certified physicians for assessment for Inspire device Recommend weight loss Continue albuterol  as needed Avoid Allergens and Irritants Avoid secondhand smoke Avoid SICK contacts Recommend  Masking  when appropriate Recommend Keep up-to-date with vaccinations

## 2024-01-12 NOTE — Assessment & Plan Note (Signed)
 Moderate OSA AHI 14-17 Therapy options discussed with patient in detail Patient would like to be referred to assess for inspire device We will make a referral to [facility] pulmonary sleep certified physicians

## 2024-01-12 NOTE — Assessment & Plan Note (Signed)
 No exacerbation at this time No evidence of heart failure at this time No evidence or signs of infection at this time No respiratory distress No fevers, chills, nausea, vomiting, diarrhea No evidence of lower extremity edema No evidence hemoptysis Avoid Allergens and Irritants Avoid secondhand smoke Avoid SICK contacts Recommend  Masking  when appropriate Recommend Keep up-to-date with vaccinations Continue to use albuterol  as needed Avoid secondhand smoke

## 2024-01-12 NOTE — Progress Notes (Unsigned)
 Name: Christine Mcneil MRN: [id number] DOB: Oct 13, 1965    CHIEF COMPLAINT:  Assessment for OSA   HISTORY OF PRESENT ILLNESS: HST performed June 2025 Patient had moderate sleep apnea with AHI of 14-17 I have discussed all options with patient at this time  Option #1 Try autoCPAP therapy 4-14 cm h20, with face mask of choice  Option #2  Assess for hypoglossal nerve stimulator after trying CPAP for 30 days  Option #3 Referral to dental office for oral appliance and repeat sleep study test  Option #4  Aggressive weight loss and repeat HST  Option #5 is to do nothing  Patient has chosen to get referral for hypoglossal nerve stimulator  No exacerbation at this time No evidence of heart failure at this time No evidence or signs of infection at this time No respiratory distress No fevers, chills, nausea, vomiting, diarrhea No evidence of lower extremity edema No evidence hemoptysis   Patient uses albuterol  as needed Patient weighs 248 pounds Likely has reactive airways disease and asthma No maintenance therapy at this time No respiratory issues at this time   PAST MEDICAL HISTORY :   has a past medical history of Abdominal pain, Anxiety and depression, Atypical facial pain, Bilateral occipital neuralgia, Cervical spondylosis without myelopathy, Chronic daily headache, Chronic pain, Chronic pelvic pain in female, Chronic thoracic back pain, Depression, Diabetes mellitus without complication (HCC), Diabetes mellitus, type II (HCC), Dysphagia, Dysrhythmia, Fibromyalgia, Hyperlipidemia, Hypertension, Hypothyroidism, Insomnia, Iron  deficiency anemia, Left hip pain, Low back pain, Migraines, Numbness, Optic neuropathy, OSA (obstructive sleep apnea), Polyarthralgia, Primary osteoarthritis of both hips, Prothrombin gene mutation (HCC), Pseudotumor cerebri, Rectal bleeding, Right shoulder pain, Rotator cuff syndrome, Stroke (HCC), Thyroid disease, and TIA (transient ischemic attack)  (05/27/2017).  has a past surgical history that includes Cholecystectomy; Rotator cuff repair (Right); Knee surgery (Left); Abdominal hysterectomy; spinal neurostimulator; Colonoscopy with propofol  (N/A, 03/21/2018); Esophagogastroduodenoscopy (egd) with propofol  (N/A, 03/21/2018); Givens capsule study (N/A, 09/24/2019); ERCP (N/A, 08/19/2020); and LEFT HEART CATH AND CORONARY ANGIOGRAPHY (N/A, 03/10/2022). Prior to Admission medications   Medication Sig Start Date End Date Taking? Authorizing Provider  ACCU-CHEK AVIVA PLUS test strip  03/27/18   [provider]  ACCU-CHEK SOFTCLIX LANCETS lancets  11/07/17   [provider]  Acetaminophen  (TYLENOL  ARTHRITIS EXT RELIEF PO) Take 600 mg by mouth daily.    [provider]  albuterol  (VENTOLIN  HFA) 108 (90 Base) MCG/ACT inhaler Inhale 2 puffs into the lungs every 6 (six) hours as needed for wheezing or shortness of breath. 03/08/19   Delores Raford SAILOR, MD  atorvastatin  (LIPITOR) 80 MG tablet Take 80 mg by mouth daily at 6 PM. 12/22/17   [provider]  Blood Glucose Monitoring Suppl (ACCU-CHEK AVIVA PLUS) w/Device KIT  11/07/17   [provider]  brexpiprazole  (REXULTI ) 0.25 MG TABS tablet Take 1 tablet (0.25 mg total) by mouth daily. Take along with 0.5 mg daily , total of 0.75 mg daily. 10/10/23   Eappen, Saramma, MD  Brexpiprazole  (REXULTI ) 0.5 MG TABS Take 1 tablet (0.5 mg total) by mouth daily. Take along with 0.25 mg , total of 0.75 mg daily 10/10/23   Eappen, Saramma, MD  Carboxymethylcellulose Sod PF 0.5 % SOLN Apply 1 drop to eye daily as needed. 05/15/15   [provider]  Cholecalciferol  (VITAMIN D3) 1000 units CAPS Take 1 capsule by mouth daily.    [provider]  clindamycin  (CLEOCIN ) 150 MG capsule Take 3 capsules (450 mg total) by mouth 3 (three)  times daily for 7 days. 10/07/23 10/14/23  Viviann Pastor, MD  clobetasol ointment (TEMOVATE) 0.05 % Apply topically. 06/16/21   [provider]  Continuous Blood Gluc Receiver (DEXCOM G6 RECEIVER) DEVI Use to monitor blood sugar. [facility] [id number] 08/07/21   [provider]  Continuous Blood Gluc Sensor (DEXCOM G6 SENSOR) MISC Use to monitor blood sugar.  Replace every 10 days. [facility] [address]-96. 08/07/21   [provider]  Continuous Blood Gluc Transmit (DEXCOM G6 TRANSMITTER) MISC Use to monitor blood sugar.  Replace every 3 months. [facility] [id number]. 08/07/21   [provider]  cyclobenzaprine (FLEXERIL) 5 MG tablet Take 1 tablet by mouth 3 (three) times daily as needed. 01/14/21   [provider]  dabigatran  (PRADAXA ) 150 MG CAPS capsule Take 150 mg by mouth 2 (two) times daily. 10/12/16 08/17/22  [provider]  dicyclomine  (BENTYL ) 20 MG tablet TAKE 1 TABLET (20 MG TOTAL) BY MOUTH 3 (THREE) TIMES DAILY BEFORE MEALS. 08/29/23   Jinny Carmine, MD  DILT-XR 240 MG 24 hr capsule Take 240 mg by mouth daily. 08/17/22   [provider]  doxycycline  (VIBRA -TABS) 100 MG tablet Take 1 tablet (100 mg total) by mouth 2 (two) times daily. 08/11/23   Ward, Josette SAILOR, DO  esomeprazole (NEXIUM) 40 MG capsule Take 40 mg by mouth daily.    [provider]  ezetimibe (ZETIA) 10 MG tablet Take 10 mg by mouth at bedtime.    [provider]  famotidine  (PEPCID ) 40 MG tablet Take 40 mg by mouth at bedtime. 10/10/19   [provider]  ferrous sulfate 325 (65 FE) MG tablet Take by mouth. Take 325 mg daily with breakfast    [provider]  fluticasone  (FLONASE ) 50 MCG/ACT nasal spray Place 1 spray into both nostrils daily. 08/11/23 08/10/24  Ward, Josette SAILOR, DO  furosemide (LASIX) 40 MG tablet TAKE (1) TABLET BY MOUTH EVERY DAY 11/17/21   [provider]  gabapentin  (NEURONTIN ) 600 MG tablet Take 2 tablets (1,200 mg total) by mouth at bedtime. Prescribed by pain doc 05/09/23   Eappen, Saramma, MD  HUMALOG KWIKPEN 200 UNIT/ML KwikPen SMARTSIG:100 Unit(s) SUB-Q  Daily 09/29/22   [provider]  hydrOXYzine  (VISTARIL ) 25 MG capsule Take 1-2 capsules (25-50 mg total) by mouth 2 (two) times daily as needed for anxiety (and sleep). 11/23/22   Eappen, Saramma, MD  Insulin  Disposable Pump (OMNIPOD 5 G6 INTRO, GEN 5,) KIT SMARTSIG:1 SUB-Q Every 3 Days    [provider]  Insulin  Pen Needle (TRUEPLUS PEN NEEDLES) 29G X MISC  10/22/21   [provider]  levothyroxine  (SYNTHROID ) 112 MCG tablet Take 112 mcg by mouth daily. 09/09/22   [provider]  lidocaine  4 % 1 patch daily. 06/30/22   [provider]  magnesium oxide (MAG-OX) 400 MG tablet Take 1 tablet by mouth daily. 12/03/20   [provider]  Melatonin 10 MG TABS Take 2 tablets by mouth at bedtime. 08/15/19   [provider]  meloxicam (MOBIC) 15 MG tablet Take 15 mg by mouth daily. 06/09/20   [provider]  metFORMIN  (GLUCOPHAGE -XR) 500 MG 24 hr tablet Take 500 mg by mouth 2 (two) times daily.  02/17/18   [provider]  methocarbamol (ROBAXIN) 500 MG tablet Take 500 mg by mouth 2 (two) times daily as needed. 06/06/23   [provider]  metoprolol  succinate (TOPROL -XL) 25 MG 24 hr tablet Take 1 tablet by mouth daily. 05/13/20  [provider]  mometasone (ELOCON) 0.1 % cream Apply topically. 05/07/21   [provider]  naloxone [facility]) nasal spray 4 mg/0.1 mL Place into the nose. 01/14/21   [provider]  nitroGLYCERIN  (NITROSTAT ) 0.4 MG SL tablet Place under the tongue. 02/02/21 02/02/22  [provider]  nystatin (MYCOSTATIN) 100000 UNIT/ML suspension Take 5 mLs by mouth 4 (four) times daily. 09/06/19   [provider]  omeprazole  (PRILOSEC) 20 MG capsule TAKE (1) CAPSULE BY MOUTH EVERY DAY 08/30/19   Tahiliani, Varnita B, MD  ondansetron  (ZOFRAN ) 4 MG tablet Take 4 mg by mouth every 8 (eight) hours as needed. 01/22/22   [provider]  pioglitazone (ACTOS) 15 MG tablet  Take by mouth. Take 1 tablet by mouth once daily 11/07/17 02/21/23  [provider]  RYBELSUS 7 MG TABS Take 1 tablet by mouth daily. 02/22/22   [provider]  sertraline  (ZOLOFT ) 100 MG tablet TAKE 2 TABLETS (200 MG TOTAL) BY MOUTH DAILY WITH BREAKFAST. 09/30/23 12/29/23  Eappen, Saramma, MD  vitamin B-12 (CYANOCOBALAMIN ) 100 MCG tablet Take 1 tablet (100 mcg total) by mouth daily. 07/17/21   Dasie Tinnie MATSU, NP  XTAMPZA  ER 9 MG C12A PLEASE SEE ATTACHED FOR DETAILED DIRECTIONS 06/16/21   [provider]  zaleplon  (SONATA ) 5 MG capsule Take 1 capsule (5 mg total) by mouth at bedtime as needed for sleep. 07/31/23 11/28/23  Eappen, Saramma, MD  zonisamide (ZONEGRAN) 100 MG capsule Take 100 mg by mouth daily. 06/02/22   [provider]   Allergies  Allergen Reactions   Semaglutide Other (See Comments)    Patient became sick, dehydrated, had to go to hospital--couldn't tolerate  semaglutide   Amoxapine Itching   Amoxicillin Itching   Dapagliflozin Itching    Other reaction(s): Other (See Comments) Thrush & vaginal itching   Keflex [Cephalexin] Itching   Mirtazapine  Other (See Comments)    Pt states felling like my body is outside of me.    FAMILY HISTORY:  family history includes Alcohol abuse in her father; Anemia in her mother; Anxiety disorder in her son; Bipolar disorder in her son; COPD in her mother; CVA in her mother; Depression in her son; Diabetes in her father and mother; Hyperlipidemia in her father and mother; Hypertension in her father, mother, and sister; Peripheral vascular disease in her sister; Seizures in her son; Thyroid disease in her father. SOCIAL HISTORY:  reports that she has never smoked. She has never used smokeless tobacco. She reports that she does not drink alcohol and does not use drugs.  LMP  (LMP Unknown)   BP 100/70   Pulse 70   Temp 98.4 F (36.9 C)   Ht 5' 6 (1.676 m)   Wt 243 lb 3.2 oz (110.3 kg)   LMP  (LMP Unknown)    SpO2 96%   BMI 39.25 kg/m      Review of Systems: Gen:  Denies  fever, sweats, chills weight loss  HEENT: Denies blurred vision, double vision, ear pain, eye pain, hearing loss, nose bleeds, sore throat Cardiac:  No dizziness, chest pain or heaviness, chest tightness,edema, No JVD Resp:   No cough, -sputum production, -shortness of breath,-wheezing, -hemoptysis,  Other:  All other systems negative   Physical Examination:   General Appearance: No distress  EYES PERRLA, EOM intact.   NECK Supple, No JVD Pulmonary: normal breath sounds, No wheezing.  CardiovascularNormal S1,S2.  No m/r/g.   Abdomen: Benign, Soft, non-tender. Neurology UE/LE 5/5 strength,  no focal deficits Ext pulses intact, cap refill intact ALL OTHER ROS ARE NEGATIVE    ASSESSMENT AND PLAN SYNOPSIS 58 year old morbidly obese white female with a diagnosis of moderate sleep apnea AHI 14-17 in the setting of morbid obesity deconditioned state with underlying reactive airways disease which is mild  Assessment & Plan OSA (obstructive sleep apnea) Moderate OSA AHI 14-17 Therapy options discussed with patient in detail Patient would like to be referred to assess for inspire device We will make a referral to [facility] pulmonary sleep certified physicians  Mild intermittent reactive airway disease without complication No exacerbation at this time No evidence of heart failure at this time No evidence or signs of infection at this time No respiratory distress No fevers, chills, nausea, vomiting, diarrhea No evidence of lower extremity edema No evidence hemoptysis Avoid Allergens and Irritants Avoid secondhand smoke Avoid SICK contacts Recommend  Masking  when appropriate Recommend Keep up-to-date with vaccinations Continue to use albuterol  as needed Avoid secondhand smoke Morbid obesity due to excess calories (HCC) Recommend weight loss   Hypertension - Sleep apnea can contribute to  hypertension, therefore treatment of sleep apnea is important part of hypertension management.  Diabetes Mellitis - Sleep apnea can contribute to DM, therefore treatment of sleep apnea is important part of DM management.    MEDICATION ADJUSTMENTS/LABS AND TESTS ORDERED: Referral to [facility] for Inspire device assessment Recommend weight loss Albuterol  as needed  CURRENT MEDICATIONS REVIEWED AT LENGTH WITH PATIENT TODAY   Patient  satisfied with Plan of action and management. All questions answered  Follow up  1 year    I spent a total of 41 minutes dedicated to the care of this patient on the date of this encounter to include pre-visit review of records, face-to-face time with the patient discussing conditions above, post visit ordering of testing, clinical documentation with the electronic health record, making appropriate referrals as documented, and communicating necessary information to the patient's healthcare team.    The Patient requires high complexity decision making for assessment and support, frequent evaluation and titration of therapies, application of advanced monitoring technologies and extensive interpretation of multiple databases.  Patient satisfied with Plan of action and management. All questions answered    Nickolas Alm Cellar, M.D.  [facility]  Medical Director [employer] [facility] Medical Director [facility] Cardio-Pulmonary Department

## 2024-01-17 ENCOUNTER — Ambulatory Visit (INDEPENDENT_AMBULATORY_CARE_PROVIDER_SITE_OTHER): Admitting: Licensed Clinical Social Worker

## 2024-01-17 DIAGNOSIS — Z634 Disappearance and death of family member: Secondary | ICD-10-CM

## 2024-01-17 DIAGNOSIS — F411 Generalized anxiety disorder: Secondary | ICD-10-CM | POA: Diagnosis not present

## 2024-01-17 DIAGNOSIS — F4310 Post-traumatic stress disorder, unspecified: Secondary | ICD-10-CM | POA: Diagnosis not present

## 2024-01-17 DIAGNOSIS — F3342 Major depressive disorder, recurrent, in full remission: Secondary | ICD-10-CM | POA: Diagnosis not present

## 2024-01-17 NOTE — Progress Notes (Signed)
 THERAPIST PROGRESS NOTE  Virtual Visit via Video Note  I connected with Christine Mcneil on 01/17/24 at  2:00 PM EDT by a video enabled telemedicine application and verified that I am speaking with the correct person using two identifiers.  Location: Patient: Address on file [address] [place], [place]  Provider: [facility]   I discussed the limitations of evaluation and management by telemedicine and the availability of in person appointments. The patient expressed understanding and agreed to proceed.  I discussed the assessment and treatment plan with the patient. The patient was provided an opportunity to ask questions and all were answered. The patient agreed with the plan and demonstrated an understanding of the instructions.   The patient was advised to call back or seek an in-person evaluation if the symptoms worsen or if the condition fails to improve as anticipated.  I provided 39 minutes of non-face-to-face time during this encounter.   Christine Mcneil KATHEE Husband, LCSW   Session Time: 2:02-2:41pm  Participation Level: Active  Behavioral Response: CasualAlertEuthymic  Type of Therapy: Individual Therapy  Treatment Goals addressed:  Active     Anxiety     LTG: Christine Mcneil will score less than 5 on the Generalized Anxiety Disorder 7 Scale (GAD-7)  (Not Progressing)     Start:  08/01/23    Expected End:  04/18/24       Goal Note     01/17/24: Reports her anxiety has been higher as a result of disagreements within her family. Shares she is finding difficulties in remaining outside of the discord.          STG: Christine Mcneil will reduce frequency of avoidant behaviors by 50% as evidenced by self-report in therapy sessions (Not Progressing)     Start:  08/01/23    Expected End:  04/18/24         Perform psychoeducation regarding anxiety disorders     Start:  08/01/23         Work with patient individually to identify the major components of a recent episode of anxiety: physical  symptoms, major thoughts and images, and major behaviors they experienced     Start:  08/01/23         Perform motivational interviewing regarding increasing desire and follow though of fishing excursions.      Start:  08/01/23         Coping Skills      Start:  08/01/23       Will work with the pt using CBT/DBT techniques to help the pt verbalize an understanding of the cognitive, physiological, and behavioral components of anxiety and its treatment. This will be done by using worksheets, interactive activities, CBT/ABC thought logs, modeling, homework, role playing and journaling. Will work with pt to learn and implement coping skills that result in a reduction of anxiety and improve daily functioning per pt self-report 3 out of 5 documented sessions.         OP Depression     LTG: Reduce frequency, intensity, and duration of depression symptoms so that daily functioning is improved (Not Progressing)     Start:  08/01/23    Expected End:  04/18/24       Goal Note     01/16/24: Reports she has places her self care on the back burner due to personal relationships.          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          STG: Christine Mcneil will identify cognitive patterns and beliefs that support depression (Not Progressing)     Start:  08/01/23    Expected End:  04/18/24         STG: help me get over some of this anger towards her relatives and working towards forgiveness.  (Progressing)     Start:  08/01/23    Expected End:  04/18/24       Goal Note     01/17/24: Reports she has experienced progress in forgiving her sister.          STG - Misc 1 (Not Progressing)     Start:  08/01/23    Expected  End:  04/18/24      to be more happy [fishing more, laughing, get out more, hangout with sister]      Demonstrates progress in stages of grief at own pace (Not Progressing)     Start:  08/01/23    Expected End:  04/18/24          Goal Note     01/17/24: Continues to report disturbance as she feels her family is disrespecting her mother with current behaviors.          Work with Christine Mcneil to identify the major components of a recent episode of depression: physical symptoms, major thoughts and images, and major behaviors they experienced     Start:  08/01/23         Christine Mcneil will identify 2 cognitive distortions they are currently using and write reframing statements to replace them     Start:  08/01/23         Coping Skills     Start:  08/01/23       Will work with the pt using CBT/DBT techniques to help the pt verbalize an understanding of the cognitive, physiological, and behavioral components of depression and its treatment. This will be done by using worksheets, interactive activities, CBT/ABC thought logs, modeling, homework, role playing and journaling. Will work with pt to learn and implement coping skills that result in a reduction of depression and improve daily functioning per pt self-report 3 out of 5 documented sessions.       Educate patient on: Stages of grief     Start:  08/01/23                 ProgressTowards Goals: Progressing  Interventions: CBT, Assertiveness Training, Supportive, and Reframing  Summary: Christine Mcneil is a 58 y.o. female who presents with  anxiety, depression, and bereavement. Patient identifies symptoms including lack of motivation, low mood, trouble falling asleep, low energy, change in appetite, anxious feelings, uncontrollable worry, tension, restlessness, and irritability. Pt was oriented times 5. Pt was cooperative and engaged. Pt denies SI/HI/AVH.     The patient reports she has been socializing with her sister. She states that  her mood has been good, but mentions she's only getting about four hours of sleep each night, saying, my mind don't want to shut down. She reflected on the stressors contributing to her anxiety and ruminating thoughts, and the clinician helped her identify controllable factors.   The patient discussed conflicts with relatives that disrupt her peace of  mind. She expresses a protective nature over her family and often involves herself in their affairs. We explored alternative ways the patient can support her sister, but she struggled to identify different behaviors. She said, I'm just that type of person, and I guess it's just going to worry me.  The clinician educated the patient about the CBT triangle, aiming to help her reframe negative thoughts to change her behaviors and reduce anxiety. Also explored ways she can limit her exposure to drama on social media and discussed solutions to address boredom, as well as alternative coping mechanisms. Additionally, we covered sleep hygiene and suggested apps she can download to promote a restful night's sleep.  Cln utilized the second half of session to review patients progress. See progress notes documented above.   The clinician readministered the PHQ-9 and GAD-7 assessments. The patient's anxiety scores increased from 10 to 16, and depression scores also increased from 10 to 12. The patient shares family disagreements are worsening her MH symptoms and interfering with ability to progress through her grief.   Suicidal/Homicidal: Nowithout intent/plan  Therapist Response: Clinician utilized active listening to create a safe space for patient to process recent life events. Clinician assessed for current symptoms, stressors, safety since last session. Cln educated the patient on the CBT triangle in an effort to aid in reframing negative cognitions to change behaviors to decrease her anxiety. Cln re administered the PHQ9 and GAD7 to assess for progress.   Updated patient TP based on feedback.   Plan: Return again in 2 weeks.  Diagnosis: Recurrent major depressive disorder, in full remission (HCC)  Bereavement  GAD (generalized anxiety disorder)  PTSD (post-traumatic stress disorder)   Collaboration of Care: AEB psychiatrist can access notes and cln. Will review psychiatrists' notes. Check in with the patient and will see LCSW per availability. Patient agreed with treatment recommendations.   Patient/Guardian was advised Release of Information must be obtained prior to any record release in order to collaborate their care with an outside provider. Patient/Guardian was advised if they have not already done so to contact the registration department to sign all necessary forms in order for us  to release information regarding their care.   Consent: Patient/Guardian gives verbal consent for treatment and assignment of benefits for services provided during this visit. Patient/Guardian expressed understanding and agreed to proceed.   Christine Mcneil KATHEE Husband, LCSW 01/17/2024

## 2024-01-25 ENCOUNTER — Ambulatory Visit: Attending: Physician Assistant

## 2024-01-25 DIAGNOSIS — M542 Cervicalgia: Secondary | ICD-10-CM | POA: Insufficient documentation

## 2024-01-25 DIAGNOSIS — M6281 Muscle weakness (generalized): Secondary | ICD-10-CM | POA: Insufficient documentation
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
## 2024-01-26 ENCOUNTER — Other Ambulatory Visit: Payer: Self-pay | Admitting: Family Medicine

## 2024-01-26 DIAGNOSIS — K760 Fatty (change of) liver, not elsewhere classified: Secondary | ICD-10-CM

## 2024-01-30 ENCOUNTER — Encounter

## 2024-01-30 ENCOUNTER — Ambulatory Visit (INDEPENDENT_AMBULATORY_CARE_PROVIDER_SITE_OTHER): Admitting: Licensed Clinical Social Worker

## 2024-01-30 DIAGNOSIS — Z634 Disappearance and death of family member: Secondary | ICD-10-CM

## 2024-01-30 DIAGNOSIS — F4310 Post-traumatic stress disorder, unspecified: Secondary | ICD-10-CM

## 2024-01-30 DIAGNOSIS — F411 Generalized anxiety disorder: Secondary | ICD-10-CM | POA: Diagnosis not present

## 2024-01-30 DIAGNOSIS — F3342 Major depressive disorder, recurrent, in full remission: Secondary | ICD-10-CM

## 2024-01-30 NOTE — Progress Notes (Signed)
 THERAPIST PROGRESS NOTE  Virtual Visit via Video Note  I connected with Christine Mcneil on 01/30/24 at  1:00 PM EDT by a video enabled telemedicine application and verified that I am speaking with the correct person using two identifiers.  Location: Patient: Address on file  Provider: Providers address   I discussed the limitations of evaluation and management by telemedicine and the availability of in person appointments. The patient expressed understanding and agreed to proceed.   I discussed the assessment and treatment plan with the patient. The patient was provided an opportunity to ask questions and all were answered. The patient agreed with the plan and demonstrated an understanding of the instructions.   The patient was advised to call back or seek an in-person evaluation if the symptoms worsen or if the condition fails to improve as anticipated.  I provided 45 minutes of non-face-to-face time during this encounter.   Evalene KATHEE Husband, LCSW   Session Time: 1-1:45pm Based on technical difficulties the session was completed via phone.  Participation Level: Active  Behavioral Response: CasualAlertDepressed  Type of Therapy: Individual Therapy  Treatment Goals addressed:  Active     Anxiety     LTG: Christine Mcneil will score less than 5 on the Generalized Anxiety Disorder 7 Scale (GAD-7)  (Not Progressing)     Start:  08/01/23    Expected End:  04/18/24       Goal Note     01/17/24: Reports her anxiety has been higher as a result of disagreements within her family. Shares she is finding difficulties in remaining outside of the discord.          STG: Christine Mcneil will reduce frequency of avoidant behaviors by 50% as evidenced by self-report in therapy sessions (Not Progressing)     Start:  08/01/23    Expected End:  04/18/24         Perform psychoeducation regarding anxiety disorders     Start:  08/01/23         Work with patient individually to identify the major  components of a recent episode of anxiety: physical symptoms, major thoughts and images, and major behaviors they experienced     Start:  08/01/23         Perform motivational interviewing regarding increasing desire and follow though of fishing excursions.      Start:  08/01/23         Coping Skills      Start:  08/01/23       Will work with the pt using CBT/DBT techniques to help the pt verbalize an understanding of the cognitive, physiological, and behavioral components of anxiety and its treatment. This will be done by using worksheets, interactive activities, CBT/ABC thought logs, modeling, homework, role playing and journaling. Will work with pt to learn and implement coping skills that result in a reduction of anxiety and improve daily functioning per pt self-report 3 out of 5 documented sessions.         OP Depression     LTG: Reduce frequency, intensity, and duration of depression symptoms so that daily functioning is improved (Not Progressing)     Start:  08/01/23    Expected End:  04/18/24       Goal Note     01/16/24: Reports she has places her self care on the back burner due to personal relationships.          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          STG: Christine Mcneil will identify cognitive patterns and beliefs that support depression (Not Progressing)     Start:  08/01/23    Expected End:  04/18/24         STG: help me get over some of this anger towards her relatives and working towards forgiveness.  (Progressing)     Start:  08/01/23    Expected End:  04/18/24       Goal Note     01/17/24: Reports she has experienced progress in forgiving her sister.          STG - Misc 1  (Not Progressing)     Start:  08/01/23    Expected End:  04/18/24      to be more happy [fishing more, laughing, get out more, hangout with sister]      Demonstrates progress in stages of grief at own pace (Not Progressing)     Start:  08/01/23    Expected End:  04/18/24          Goal Note     01/17/24: Continues to report disturbance as she feels her family is disrespecting her mother with current behaviors.          Work with Christine Mcneil to identify the major components of a recent episode of depression: physical symptoms, major thoughts and images, and major behaviors they experienced     Start:  08/01/23         Christine Mcneil will identify 2 cognitive distortions they are currently using and write reframing statements to replace them     Start:  08/01/23         Coping Skills     Start:  08/01/23       Will work with the pt using CBT/DBT techniques to help the pt verbalize an understanding of the cognitive, physiological, and behavioral components of depression and its treatment. This will be done by using worksheets, interactive activities, CBT/ABC thought logs, modeling, homework, role playing and journaling. Will work with pt to learn and implement coping skills that result in a reduction of depression and improve daily functioning per pt self-report 3 out of 5 documented sessions.       Educate patient on: Stages of grief     Start:  08/01/23               ProgressTowards Goals: Progressing  Interventions: Assertiveness Training and Supportive  Summary: Christine Mcneil is a 58 y.o. female who presents with  anxiety, depression, and bereavement. Patient identifies symptoms including lack of motivation, low mood, trouble falling asleep, low energy, change in appetite, anxious feelings, uncontrollable worry, tension, restlessness, and irritability. Pt was oriented times 5. Pt was cooperative and engaged. Pt denies SI/HI/AVH.    The patient reported that she has been  intentionally limiting her time spent online and distracting herself from unhealthy relationships and digital spaces.  She expressed that this decision has contributed to her sense of calmness and a reduction in her anxiety.  Clinician praised patient's efforts to distance herself from negative behaviors.  During this session, the patient reflected on ongoing disagreements with certain individuals in her  life, particularly focusing on her significant other.  We explored how she has employed assertive communication techniques to articulate her feelings more effectively.  She noted that since her health is declined, there has been a noticeable increase in conflicts within her significant relationship.  This realization prompted her to explore emotions tied to past comments that have surfaced during disagreements.  We also touched on the theme of jealousy, examining how it has affected their relationship dynamics.  The patient identified the need for quality time with her partner as a key goal to foster connection and decrease misunderstandings.  The clinician and patient engaged in role-playing exercises, allowing her to practice conversations that acknowledged feelings of others while employing constructive solutions.  Through this exercise, she reflected on the changes within their relationship and the notion she would like to get back to core components.  Suicidal/Homicidal: Nowithout intent/plan  Therapist Response: Clinician utilized active listening to create a safe space for patient to process recent life events. Clinician assessed for current symptoms, stressors, safety since last session.  Utilized components of healthy communication through assertiveness approaches to encourage patient to find ways to communicate her emotions as well as offer opportunity for further discussion to avoid disagreements.  Reflected on ways in which patient is establishing boundaries and refraining from distressing  engagements.  Plan: Return again in 2 weeks.  Diagnosis: Recurrent major depressive disorder, in full remission (HCC)  GAD (generalized anxiety disorder)  PTSD (post-traumatic stress disorder)  Bereavement   Collaboration of Care: [facility] psychiatrist can access notes and cln. Will review psychiatrists' notes. Check in with the patient and will see LCSW per availability. Patient agreed with treatment recommendations.   Patient/Guardian was advised Release of Information must be obtained prior to any record release in order to collaborate their care with an outside provider. Patient/Guardian was advised if they have not already done so to contact the registration department to sign all necessary forms in order for us  to release information regarding their care.   Consent: Patient/Guardian gives verbal consent for treatment and assignment of benefits for services provided during this visit. Patient/Guardian expressed understanding and agreed to proceed.   Evalene KATHEE Husband, LCSW 01/30/2024

## 2024-01-31 ENCOUNTER — Encounter

## 2024-02-01 ENCOUNTER — Ambulatory Visit

## 2024-02-01 DIAGNOSIS — M542 Cervicalgia: Secondary | ICD-10-CM

## 2024-02-01 DIAGNOSIS — M6281 Muscle weakness (generalized): Secondary | ICD-10-CM
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
## 2024-02-02 ENCOUNTER — Ambulatory Visit
Admission: RE | Admit: 2024-02-02 | Discharge: 2024-02-02 | Disposition: A | Discharge status: Home / Self Care | Source: Ambulatory Visit | Attending: Family Medicine | Admitting: Family Medicine

## 2024-02-02 DIAGNOSIS — K760 Fatty (change of) liver, not elsewhere classified: Secondary | ICD-10-CM | POA: Diagnosis present

## 2024-02-03 ENCOUNTER — Ambulatory Visit: Admitting: Physical Therapy

## 2024-02-03 DIAGNOSIS — M542 Cervicalgia: Secondary | ICD-10-CM

## 2024-02-03 DIAGNOSIS — M6281 Muscle weakness (generalized): Secondary | ICD-10-CM
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
## 2024-02-06 ENCOUNTER — Ambulatory Visit

## 2024-02-06 ENCOUNTER — Ambulatory Visit: Admitting: Pulmonary Disease

## 2024-02-06 ENCOUNTER — Encounter: Payer: Self-pay | Admitting: Pulmonary Disease

## 2024-02-06 VITALS — BP 110/74 | HR 70 | Temp 97.6°F | Ht 66.0 in | Wt 246.8 lb

## 2024-02-06 DIAGNOSIS — M542 Cervicalgia: Secondary | ICD-10-CM

## 2024-02-06 DIAGNOSIS — M6281 Muscle weakness (generalized): Secondary | ICD-10-CM

## 2024-02-06 DIAGNOSIS — G4700 Insomnia, unspecified: Secondary | ICD-10-CM

## 2024-02-06 DIAGNOSIS — G4733 Obstructive sleep apnea (adult) (pediatric): Secondary | ICD-10-CM

## 2024-02-06 NOTE — Patient Instructions (Addendum)
 We will schedule you for an in-lab sleep study -[place] health requires an in-lab sleep study for evaluation for an inspire device  You do have a history of claustrophobia  Continue weight loss efforts  Depending on findings from the in-lab study, we will need to ascertain with your insurance that you will be a candidate for an inspire device  Follow-up in 3 months

## 2024-02-06 NOTE — Progress Notes (Signed)
 Christine Mcneil    [id number]    1966/05/09  Primary Care Physician:Doughton, Norleen HERO, MD  Referring Physician: Walker Norleen HERO, MD [address] [place],  [place] [address]  Chief complaint:   Patient being seen for evaluation for sleep apnea  HPI:  Recently had a home sleep study done showing an AHI of 17-3% desaturation rule  Usually tries to go to bed between 9 and 10 Takes about an hour to fall asleep About 4 awakenings Final wake up time about 10 AM  Did have a sleep study done about 10 years ago at [facility], was started on CPAP therapy Stated she used CPAP for about 6 months, could not get used to it CPAP therapy was discontinued because of claustrophobia  Admits to dryness of her mouth in the morning Morning headaches Memory is fair but usually wakes up 5 be in the morning  Does not smoke Does not use alcohol Suffers from musculoskeletal pain and discomfort  She does get tired and sleepy during the day  Recently saw Dr. Nickolas and discussed a sleep study Patient elects to have an inspire device evaluation was the reason for referral  Outpatient Encounter Medications as of 02/06/2024  Medication Sig   ACCU-CHEK AVIVA PLUS test strip    ACCU-CHEK SOFTCLIX LANCETS lancets    Acetaminophen  (TYLENOL  ARTHRITIS EXT RELIEF PO) Take 600 mg by mouth daily.   albuterol  (VENTOLIN  HFA) 108 (90 Base) MCG/ACT inhaler Inhale 2 puffs into the lungs every 6 (six) hours as needed for wheezing or shortness of breath.   atorvastatin  (LIPITOR) 80 MG tablet Take 80 mg by mouth daily at 6 PM.   Blood Glucose Monitoring Suppl (ACCU-CHEK AVIVA PLUS) w/Device KIT    brexpiprazole  (REXULTI ) 0.25 MG TABS tablet Take 1 tablet (0.25 mg total) by mouth daily. Take along with 0.5 mg daily , total of 0.75 mg daily.   Brexpiprazole  (REXULTI ) 0.5 MG TABS Take 1 tablet (0.5 mg total) by mouth daily. Take along with 0.25 mg , total of 0.75 mg daily   Carboxymethylcellulose Sod PF 0.5 % SOLN  Apply 1 drop to eye daily as needed.   Cholecalciferol  (VITAMIN D3) 1000 units CAPS Take 1 capsule by mouth daily.   clobetasol ointment (TEMOVATE) 0.05 % Apply topically.   clotrimazole (LOTRIMIN) 1 % cream Apply 1 Application topically.   Continuous Blood Gluc Receiver (DEXCOM G6 RECEIVER) DEVI Use to monitor blood sugar. NDC [phone]   Continuous Blood Gluc Sensor (DEXCOM G6 SENSOR) MISC Use to monitor blood sugar.  Replace every 10 days. NDC 6125117619.   Continuous Blood Gluc Transmit (DEXCOM G6 TRANSMITTER) MISC Use to monitor blood sugar.  Replace every 3 months. NDC [address]-01.   dabigatran  (PRADAXA ) 150 MG CAPS capsule Take 150 mg by mouth 2 (two) times daily.   dicyclomine  (BENTYL ) 20 MG tablet TAKE 1 TABLET (20 MG TOTAL) BY MOUTH 3 (THREE) TIMES DAILY BEFORE MEALS.   DILT-XR 240 MG 24 hr capsule Take 240 mg by mouth daily.   diltiazem  (CARDIZEM  CD) 240 MG 24 hr capsule Take 240 mg by mouth daily.   ezetimibe (ZETIA) 10 MG tablet Take 10 mg by mouth at bedtime.   famotidine  (PEPCID ) 40 MG tablet Take 40 mg by mouth at bedtime.   ferrous sulfate 325 (65 FE) MG tablet Take by mouth. Take 325 mg daily with breakfast   fexofenadine (ALLEGRA ALLERGY) 180 MG tablet Take 180 mg by mouth daily.  fluticasone  (FLONASE ) 50 MCG/ACT nasal spray Place 1 spray into both nostrils daily.   furosemide (LASIX) 40 MG tablet TAKE (1) TABLET BY MOUTH EVERY DAY   gabapentin  (NEURONTIN ) 300 MG capsule TAKE 2 CAPSULE IN AM, 1 CAP MID DAY AND 3 CAP BY MOUTH AT BEDTIME   hydrOXYzine  (VISTARIL ) 25 MG capsule Take 1-2 capsules (25-50 mg total) by mouth 2 (two) times daily as needed for anxiety (and sleep).   Insulin  Disposable Pump (OMNIPOD 5 DEXG7G6 PODS GEN 5) MISC SMARTSIG:1 SUB-Q Every Other Day   insulin  lispro (HUMALOG KWIKPEN) 200 UNIT/ML KwikPen Inject 125 Units into the skin.   Insulin  Pen Needle (TRUEPLUS PEN NEEDLES) 29G X MISC    lamoTRIgine  (LAMICTAL ) 100 MG tablet Take 100 mg by  mouth daily.   levothyroxine  (SYNTHROID ) 112 MCG tablet Take 112 mcg by mouth daily.   lidocaine  4 % 1 patch daily.   magnesium oxide (MAG-OX) 400 MG tablet Take 1 tablet by mouth daily.   Melatonin 10 MG TABS Take 2 tablets by mouth at bedtime.   meloxicam (MOBIC) 15 MG tablet Take 15 mg by mouth daily.   metFORMIN  (GLUCOPHAGE -XR) 500 MG 24 hr tablet Take 500 mg by mouth 2 (two) times daily.    methocarbamol (ROBAXIN) 500 MG tablet Take 500 mg by mouth 2 (two) times daily as needed.   metoCLOPramide  (REGLAN ) 10 MG tablet METOCLOPRAMIDE  HCL 10 MG TABS   metoprolol  succinate (TOPROL -XL) 25 MG 24 hr tablet Take 1 tablet by mouth daily.   mometasone (ELOCON) 0.1 % cream Apply topically.   naloxone (NARCAN) nasal spray 4 mg/0.1 mL Place into the nose.   nitroGLYCERIN  (NITROSTAT ) 0.4 MG SL tablet Place under the tongue.   nystatin (MYCOSTATIN) 100000 UNIT/ML suspension Take 5 mLs by mouth 4 (four) times daily.   omeprazole  (PRILOSEC) 20 MG capsule TAKE (1) CAPSULE BY MOUTH EVERY DAY   ondansetron  (ZOFRAN ) 8 MG tablet Take 8 mg by mouth every 8 (eight) hours as needed.   oxyCODONE  (OXY IR/ROXICODONE ) 5 MG immediate release tablet Take 5 mg by mouth 2 (two) times daily as needed.   promethazine  (PHENERGAN ) 12.5 MG tablet Take 12.5 mg by mouth.   QULIPTA 60 MG TABS Take 60 mg by mouth daily.   sertraline  (ZOLOFT ) 100 MG tablet TAKE 2 TABLETS (200 MG TOTAL) BY MOUTH DAILY WITH BREAKFAST.   TIADYLT  ER 240 MG 24 hr capsule Take 240 mg by mouth daily.   vitamin B-12 (CYANOCOBALAMIN ) 100 MCG tablet Take 1 tablet (100 mcg total) by mouth daily.   Vitamin D , Ergocalciferol , 50 MCG (2000 UT) CAPS TAKE 1 CAPSULE (50,000 UNITS TOTAL) BY MOUTH ONCE A WEEK FOR 56 DAYS   XTAMPZA  ER 9 MG C12A PLEASE SEE ATTACHED FOR DETAILED DIRECTIONS   zaleplon  (SONATA ) 5 MG capsule TAKE 1 CAPSULE (5 MG TOTAL) BY MOUTH AT BEDTIME AS NEEDED FOR SLEEP.   [DISCONTINUED] esomeprazole (NEXIUM) 40 MG capsule Take 40 mg by mouth  daily. (Patient not taking: Reported on 02/06/2024)   [DISCONTINUED] RYBELSUS 7 MG TABS Take 1 tablet by mouth daily. (Patient not taking: Reported on 02/06/2024)   Facility-Administered Encounter Medications as of 02/06/2024  Medication   iohexol  (OMNIPAQUE ) 300 MG/ML solution    Allergies as of 02/06/2024 - Review Complete 02/06/2024  Allergen Reaction Noted   Semaglutide Other (See Comments) 01/02/2020   Amoxapine Itching 09/16/2015   Amoxicillin Itching    Dapagliflozin Itching 11/07/2017   Keflex [cephalexin] Itching 09/16/2015   Mirtazapine  Other (See  Comments) 06/08/2019    Past Medical History:  Diagnosis Date   Abdominal pain    Anxiety and depression    Atypical facial pain    Bilateral occipital neuralgia    Cervical spondylosis without myelopathy    Chronic daily headache    Chronic pain    Chronic pelvic pain in female    Chronic thoracic back pain    Depression    Diabetes mellitus without complication (HCC)    Diabetes mellitus, type II (HCC)    Dysphagia    Dysrhythmia    Fibromyalgia    Hyperlipidemia    Hypertension    Hypothyroidism    Insomnia    Iron  deficiency anemia    Left hip pain    Low back pain    Migraines    Numbness    Optic neuropathy    OSA (obstructive sleep apnea)    Polyarthralgia    Primary osteoarthritis of both hips    Prothrombin gene mutation (HCC)    Pseudotumor cerebri    Rectal bleeding    Right shoulder pain    Rotator cuff syndrome    Stroke [facility])    Thyroid disease    TIA (transient ischemic attack) 05/27/2017    Past Surgical History:  Procedure Laterality Date   ABDOMINAL HYSTERECTOMY     total   CHOLECYSTECTOMY     COLONOSCOPY WITH PROPOFOL  N/A 03/21/2018   Procedure: COLONOSCOPY WITH PROPOFOL ;  Surgeon: Jinny Carmine, MD;  Location: [facility] ENDOSCOPY;  Service: Endoscopy;  Laterality: N/A;   ERCP N/A 08/19/2020   Procedure: ENDOSCOPIC RETROGRADE CHOLANGIOPANCREATOGRAPHY (ERCP);  Surgeon: Jinny Carmine, MD;   Location: [facility] ENDOSCOPY;  Service: Endoscopy;  Laterality: N/A;   ESOPHAGOGASTRODUODENOSCOPY (EGD) WITH PROPOFOL  N/A 03/21/2018   Procedure: ESOPHAGOGASTRODUODENOSCOPY (EGD) WITH PROPOFOL ;  Surgeon: Jinny Carmine, MD;  Location: [facility] ENDOSCOPY;  Service: Endoscopy;  Laterality: N/A;   GIVENS CAPSULE STUDY N/A 09/24/2019   Procedure: GIVENS CAPSULE STUDY;  Surgeon: Janalyn Keene NOVAK, MD;  Location: [facility] ENDOSCOPY;  Service: Endoscopy;  Laterality: N/A;   KNEE SURGERY Left    LEFT HEART CATH AND CORONARY ANGIOGRAPHY N/A 03/10/2022   Procedure: LEFT HEART CATH AND CORONARY ANGIOGRAPHY;  Surgeon: Florencio Cara BIRCH, MD;  Location: [facility] INVASIVE CV LAB;  Service: Cardiovascular;  Laterality: N/A;   ROTATOR CUFF REPAIR Right    spinal neurostimulator      Family History  Problem Relation Age of Onset   Diabetes Mother        Mother passed away on 06-10-23.   Hyperlipidemia Mother    Hypertension Mother    COPD Mother    CVA Mother    Anemia Mother    Diabetes Father    Hyperlipidemia Father    Hypertension Father    Thyroid disease Father    Alcohol abuse Father    Peripheral vascular disease Sister    Hypertension Sister    Anxiety disorder Son    Depression Son    Bipolar disorder Son    Seizures Son     Social History   Socioeconomic History   Marital status: Divorced    Spouse name: Not on file   Number of children: 1   Years of education: Not on file   Highest education level: High school graduate  Occupational History    Comment: disablitiy  Tobacco Use   Smoking status: Never    Passive exposure: Never   Smokeless tobacco: Never  Vaping Use   Vaping status: Never Used  Substance and Sexual Activity   Alcohol use: No   Drug use: No   Sexual activity: Not Currently  Other Topics Concern   Not on file  Social History Narrative   Not on file   Social Drivers of Health   Financial Resource Strain: Low Risk  (10/11/2023)   Received from [facility]   Overall Financial Resource Strain (CARDIA)    Difficulty of Paying Living Expenses: Not hard at all  Food Insecurity: No Food Insecurity (10/11/2023)   Received from [facility]   Hunger Vital Sign    Within the past 12 months, you worried that your food would run out before you got the money to buy more.: Never true    Within the past 12 months, the food you bought just didn't last and you didn't have money to get more.: Never true  Transportation Needs: No Transportation Needs (10/11/2023)   Received from [facility] - Transportation    In the past 12 months, has lack of transportation kept you from medical appointments or from getting medications?: No    Lack of Transportation (Non-Medical): No  Physical Activity: Inactive (07/19/2017)   Exercise Vital Sign    Days of Exercise per Week: 0 days    Minutes of Exercise per Session: 0 min  Stress: Stress Concern Present (07/19/2017)   [employer] of Occupational Health - Occupational Stress Questionnaire    Feeling of Stress : Very much  Social Connections: Moderately Isolated (07/19/2017)   Social Connection and Isolation Panel    Frequency of Communication with Friends and Family: Once a week    Frequency of Social Gatherings with Friends and Family: Never    Attends Religious Services: More than 4 times per year    Active Member of [employer] or Organizations: No    Attends Banker Meetings: Never    Marital Status: Divorced  Catering manager Violence: Not At Risk (07/19/2017)   Humiliation, Afraid, Rape, and Kick questionnaire    Fear of Current or Ex-Partner: No    Emotionally Abused: No    Physically Abused: No    Sexually Abused: No    Review of Systems  Respiratory:  Positive for apnea and shortness of breath.     Vitals:   02/06/24 1102  BP: 110/74  Pulse: 70  Temp: 97.6 F (36.4 C)  SpO2: 95%     Physical Exam Constitutional:       Appearance: She is obese.  HENT:     Nose: Nose normal.     Mouth/Throat:     Mouth: Mucous membranes are moist.     Comments: Crowded oropharynx, Mallampati 3 Eyes:     General: No scleral icterus.    Pupils: Pupils are equal, round, and reactive to light.  Cardiovascular:     Rate and Rhythm: Normal rate and regular rhythm.     Heart sounds: No murmur heard.    No friction rub.  Pulmonary:     Effort: No respiratory distress.     Breath sounds: No stridor. No wheezing or rhonchi.  Musculoskeletal:     Cervical back: No rigidity or tenderness.  Neurological:     Mental Status: She is alert.  Psychiatric:        Mood and Affect: Mood normal.    Data Reviewed: Reviewed will records from [facility] 2015, previous sleep study 05/09/2013 shows overall AHI of 10 an hour with REM AHI of  81/h, oxygen nadir of 68% Used CPAP inconsistently up until October 2015 - Documentation shows he returned the CPAP from noncompliance, patient states because of claustrophobia  Sleep study from 11/01/23 reviewed showing AHI of 17.1  In-lab sleep study 05/26/2017 performed at [facility] was negative for significant sleep disordered breathing with AHI of 3 an hour, severe periodic limb movement, decreased sleep efficiency  Assessment/Plan:  Moderate obstructive sleep apnea  Insomnia  Fibromyalgia  Schedule patient for an in-lab sleep study to ascertain severity of sleep disordered breathing and ensure she does not have significant central sleep apnea  Inability to tolerate CPAP therapy -Patient states she is claustrophobic and was not able to get used to CPAP when she had it previously, was not able to sleep with CPAP on-machine was returned - The notes from [facility] reflects the machine was returned due to inability to tolerate the device/not able to comply with use  She is on multiple medications that may cause sleepiness  If found to have significant sleep apnea, we will place a referral to ENT for  evaluation for an inspire device pressure   Jennet Epley MD [facility] 02/06/2024, 11:23 AM  CC: Doughton, Norleen HERO, MD

## 2024-02-07 ENCOUNTER — Encounter: Payer: Self-pay | Admitting: Oncology

## 2024-02-07 ENCOUNTER — Other Ambulatory Visit
Admission: RE | Admit: 2024-02-07 | Discharge: 2024-02-07 | Disposition: A | Discharge status: Home / Self Care | Payer: Self-pay | Source: Ambulatory Visit | Attending: Medical Genetics | Admitting: Medical Genetics

## 2024-02-07 ENCOUNTER — Other Ambulatory Visit (INDEPENDENT_AMBULATORY_CARE_PROVIDER_SITE_OTHER): Payer: Self-pay | Admitting: Nurse Practitioner

## 2024-02-07 DIAGNOSIS — M7989 Other specified soft tissue disorders: Secondary | ICD-10-CM

## 2024-02-07 DIAGNOSIS — I890 Lymphedema, not elsewhere classified: Secondary | ICD-10-CM
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
## 2024-02-08 ENCOUNTER — Ambulatory Visit (INDEPENDENT_AMBULATORY_CARE_PROVIDER_SITE_OTHER)

## 2024-02-08 ENCOUNTER — Encounter (INDEPENDENT_AMBULATORY_CARE_PROVIDER_SITE_OTHER): Payer: Self-pay | Admitting: Nurse Practitioner

## 2024-02-08 ENCOUNTER — Ambulatory Visit

## 2024-02-08 ENCOUNTER — Ambulatory Visit (INDEPENDENT_AMBULATORY_CARE_PROVIDER_SITE_OTHER): Admitting: Nurse Practitioner

## 2024-02-08 VITALS — BP 135/87 | HR 77 | Wt 249.2 lb

## 2024-02-08 DIAGNOSIS — I890 Lymphedema, not elsewhere classified: Secondary | ICD-10-CM

## 2024-02-08 DIAGNOSIS — I10 Essential (primary) hypertension: Secondary | ICD-10-CM | POA: Diagnosis not present

## 2024-02-08 DIAGNOSIS — M542 Cervicalgia: Secondary | ICD-10-CM

## 2024-02-08 DIAGNOSIS — M7989 Other specified soft tissue disorders: Secondary | ICD-10-CM

## 2024-02-08 DIAGNOSIS — G540 Brachial plexus disorders: Secondary | ICD-10-CM | POA: Diagnosis not present

## 2024-02-08 DIAGNOSIS — M6281 Muscle weakness (generalized): Secondary | ICD-10-CM

## 2024-02-14 ENCOUNTER — Ambulatory Visit

## 2024-02-14 DIAGNOSIS — M542 Cervicalgia: Secondary | ICD-10-CM | POA: Diagnosis not present

## 2024-02-14 DIAGNOSIS — M6281 Muscle weakness (generalized): Secondary | ICD-10-CM
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
## 2024-02-17 ENCOUNTER — Ambulatory Visit: Admitting: Licensed Clinical Social Worker

## 2024-02-17 ENCOUNTER — Ambulatory Visit

## 2024-02-17 DIAGNOSIS — M6281 Muscle weakness (generalized): Secondary | ICD-10-CM

## 2024-02-17 DIAGNOSIS — M542 Cervicalgia: Secondary | ICD-10-CM | POA: Diagnosis not present
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
## 2024-02-19 ENCOUNTER — Encounter (INDEPENDENT_AMBULATORY_CARE_PROVIDER_SITE_OTHER): Payer: Self-pay | Admitting: Nurse Practitioner

## 2024-02-19 NOTE — Progress Notes (Signed)
 Subjective:    Patient ID: Christine Mcneil, female    DOB: 12-07-1965, 58 y.o.   MRN: [id number] Chief Complaint  Patient presents with   Follow-up    Left arm pain and swelling    The patient returns today for evaluation of swelling in her left upper extremity.  She was previously seen for pain and discomfort and swelling in her right upper extremity.  Based on noninvasive studies there was concern for thoracic outlet syndrome provide given that this is not a brightly treated condition she was referred to [facility] for treatment and she continues to follow-up with them at this time.  However she was recently referred due to swelling in her left upper extremity.  She notes that she has not been wearing any compression as it has been painful for her.  She does elevate her extremities at the time.  Her noninvasive study showed no evidence of deep or superficial thrombosis phlebitis.  results not consistent with thoracic outlet syndrome as compared to last study.      Review of Systems  Cardiovascular:        Arm swelling  Neurological:  Positive for numbness.  All other systems reviewed and are negative.      Objective:   Physical Exam Vitals reviewed.  HENT:     Head: Normocephalic.  Cardiovascular:     Rate and Rhythm: Normal rate.     Pulses: Normal pulses.  Pulmonary:     Effort: Pulmonary effort is normal.  Musculoskeletal:        General: Swelling present.  Neurological:     Mental Status: She is alert and oriented to person, place, and time.  Psychiatric:        Mood and Affect: Mood normal.        Behavior: Behavior normal.        Thought Content: Thought content normal.        Judgment: Judgment normal.     BP 135/87 (BP Location: Right Arm, Patient Position: Sitting, Cuff Size: Large)   Pulse 77   Wt 249 lb 3.2 oz (113 kg)   LMP  (LMP Unknown)   BMI 40.22 kg/m   Past Medical History:  Diagnosis Date   Abdominal pain    Anxiety and depression    Atypical facial  pain    Bilateral occipital neuralgia    Cervical spondylosis without myelopathy    Chronic daily headache    Chronic pain    Chronic pelvic pain in female    Chronic thoracic back pain    Depression    Diabetes mellitus without complication (HCC)    Diabetes mellitus, type II (HCC)    Dysphagia    Dysrhythmia    Fibromyalgia    Hyperlipidemia    Hypertension    Hypothyroidism    Insomnia    Iron  deficiency anemia    Left hip pain    Low back pain    Migraines    Numbness    Optic neuropathy    OSA (obstructive sleep apnea)    Polyarthralgia    Primary osteoarthritis of both hips    Prothrombin gene mutation    Pseudotumor cerebri    Rectal bleeding    Right shoulder pain    Rotator cuff syndrome    Stroke [facility])    Thyroid disease    TIA (transient ischemic attack) 05/27/2017    Social History   Socioeconomic History   Marital status: Divorced  Spouse name: Not on file   Number of children: 1   Years of education: Not on file   Highest education level: High school graduate  Occupational History    Comment: disablitiy  Tobacco Use   Smoking status: Never    Passive exposure: Never   Smokeless tobacco: Never  Vaping Use   Vaping status: Never Used  Substance and Sexual Activity   Alcohol use: No   Drug use: No   Sexual activity: Not Currently  Other Topics Concern   Not on file  Social History Narrative   Not on file   Social Drivers of Health   Financial Resource Strain: Low Risk  (10/11/2023)   Received from [facility]   Overall Financial Resource Strain (CARDIA)    Difficulty of Paying Living Expenses: Not hard at all  Food Insecurity: No Food Insecurity (10/11/2023)   Received from [facility]   Hunger Vital Sign    Within the past 12 months, you worried that your food would run out before you got the money to buy more.: Never true    Within the past 12 months, the food you bought just didn't last and you  didn't have money to get more.: Never true  Transportation Needs: No Transportation Needs (10/11/2023)   Received from [facility] - Transportation    In the past 12 months, has lack of transportation kept you from medical appointments or from getting medications?: No    Lack of Transportation (Non-Medical): No  Physical Activity: Inactive (07/19/2017)   Exercise Vital Sign    Days of Exercise per Week: 0 days    Minutes of Exercise per Session: 0 min  Stress: Stress Concern Present (07/19/2017)   [employer] of Occupational Health - Occupational Stress Questionnaire    Feeling of Stress : Very much  Social Connections: Moderately Isolated (07/19/2017)   Social Connection and Isolation Panel    Frequency of Communication with Friends and Family: Once a week    Frequency of Social Gatherings with Friends and Family: Never    Attends Religious Services: More than 4 times per year    Active Member of [employer] or Organizations: No    Attends Banker Meetings: Never    Marital Status: Divorced  Catering manager Violence: Not At Risk (07/19/2017)   Humiliation, Afraid, Rape, and Kick questionnaire    Fear of Current or Ex-Partner: No    Emotionally Abused: No    Physically Abused: No    Sexually Abused: No    Past Surgical History:  Procedure Laterality Date   ABDOMINAL HYSTERECTOMY     total   CHOLECYSTECTOMY     COLONOSCOPY WITH PROPOFOL  N/A 03/21/2018   Procedure: COLONOSCOPY WITH PROPOFOL ;  Surgeon: Jinny Carmine, MD;  Location: [facility] ENDOSCOPY;  Service: Endoscopy;  Laterality: N/A;   ERCP N/A 08/19/2020   Procedure: ENDOSCOPIC RETROGRADE CHOLANGIOPANCREATOGRAPHY (ERCP);  Surgeon: Jinny Carmine, MD;  Location: [facility] ENDOSCOPY;  Service: Endoscopy;  Laterality: N/A;   ESOPHAGOGASTRODUODENOSCOPY (EGD) WITH PROPOFOL  N/A 03/21/2018   Procedure: ESOPHAGOGASTRODUODENOSCOPY (EGD) WITH PROPOFOL ;  Surgeon: Jinny Carmine, MD;  Location: [facility] ENDOSCOPY;   Service: Endoscopy;  Laterality: N/A;   GIVENS CAPSULE STUDY N/A 09/24/2019   Procedure: GIVENS CAPSULE STUDY;  Surgeon: Janalyn Keene NOVAK, MD;  Location: [facility] ENDOSCOPY;  Service: Endoscopy;  Laterality: N/A;   KNEE SURGERY Left    LEFT HEART CATH AND CORONARY ANGIOGRAPHY N/A 03/10/2022   Procedure: LEFT HEART  CATH AND CORONARY ANGIOGRAPHY;  Surgeon: Florencio Cara BIRCH, MD;  Location: [facility] INVASIVE CV LAB;  Service: Cardiovascular;  Laterality: N/A;   ROTATOR CUFF REPAIR Right    spinal neurostimulator      Family History  Problem Relation Age of Onset   Diabetes Mother        Mother passed away on 06/01/2023.   Hyperlipidemia Mother    Hypertension Mother    COPD Mother    CVA Mother    Anemia Mother    Diabetes Father    Hyperlipidemia Father    Hypertension Father    Thyroid disease Father    Alcohol abuse Father    Peripheral vascular disease Sister    Hypertension Sister    Anxiety disorder Son    Depression Son    Bipolar disorder Son    Seizures Son     Allergies  Allergen Reactions   Semaglutide Other (See Comments)    Patient became sick, dehydrated, had to go to hospital--couldn't tolerate  semaglutide   Amoxapine Itching   Amoxicillin Itching   Dapagliflozin Itching    Other reaction(s): Other (See Comments) Thrush & vaginal itching   Keflex [Cephalexin] Itching   Mirtazapine  Other (See Comments)    Pt states felling like my body is outside of me.       Latest Ref Rng & Units 12/06/2023   12:48 PM 10/07/2023    1:51 PM 06/08/2023    2:39 PM  CBC  WBC 4.0 - 10.5 K/uL 8.1  5.7  6.7   Hemoglobin 12.0 - 15.0 g/dL 86.1  86.6  87.1   Hematocrit 36.0 - 46.0 % 41.8  40.1  39.3   Platelets 150 - 400 K/uL 177  228  209       CMP     Component Value Date/Time   NA 137 10/07/2023 1351   NA 137 10/21/2020 1110   NA 138 03/22/2014 1659   K 3.7 10/07/2023 1351   K 3.7 03/22/2014 1659   CL 102 10/07/2023 1351   CL 104 03/22/2014 1659   CO2 23  10/07/2023 1351   CO2 23 03/22/2014 1659   GLUCOSE 140 (H) 10/07/2023 1351   GLUCOSE 156 (H) 03/22/2014 1659   BUN 15 10/07/2023 1351   BUN 7 10/21/2020 1110   BUN 7 03/22/2014 1659   CREATININE 1.13 (H) 10/07/2023 1351   CREATININE 0.98 03/22/2014 1659   CALCIUM  8.6 (L) 10/07/2023 1351   CALCIUM  9.0 03/22/2014 1659   PROT 7.5 10/07/2023 1351   PROT 7.5 02/22/2023 1527   PROT 8.8 (H) 03/22/2014 1659   ALBUMIN 3.6 10/07/2023 1351   ALBUMIN 4.8 02/22/2023 1527   ALBUMIN 4.5 03/22/2014 1659   AST 28 10/07/2023 1351   AST 34 03/22/2014 1659   ALT 39 10/07/2023 1351   ALT 37 03/22/2014 1659   ALKPHOS 101 10/07/2023 1351   ALKPHOS 147 (H) 03/22/2014 1659   BILITOT 0.5 10/07/2023 1351   BILITOT 0.4 02/22/2023 1527   BILITOT 0.3 03/22/2014 1659   EGFR 67 10/21/2020 1110   GFRNONAA 56 (L) 10/07/2023 1351   GFRNONAA >60 03/22/2014 1659   GFRNONAA >60 09/15/2013 0131     No results found.     Assessment & Plan:   1. Left upper extremity swelling The patient was sent for lymphedema pump for her left arm swelling however she has not yet utilized basic conservative therapy including use of a compression sleeve.  It is recommended that she try  to utilize a sleeve and to see if she can tolerate this.  Her largest complaint mostly was that it is too painful for her arm.  It is likely that the compression will exert more pressure and be uncomfortable for her.  We have discussed ways to measure for the compression sleeve as well as work where she is and how often it should be performed.  Patient will try to engage with conservative therapies and return in 3 months to evaluate progress.  2. Thoracic outlet syndrome (Primary) Currently undergoing physical therapy for this and is being treated throug thoracic outlet specialist with [facility]  3. Essential hypertension Continue antihypertensive medications as already ordered, these medications have been reviewed and there are no changes at this  time.   Current Outpatient Medications on File Prior to Visit  Medication Sig Dispense Refill   ACCU-CHEK AVIVA PLUS test strip      ACCU-CHEK SOFTCLIX LANCETS lancets      Acetaminophen  (TYLENOL  ARTHRITIS EXT RELIEF PO) Take 600 mg by mouth daily.     albuterol  (VENTOLIN  HFA) 108 (90 Base) MCG/ACT inhaler Inhale 2 puffs into the lungs every 6 (six) hours as needed for wheezing or shortness of breath. 1 g 0   atorvastatin  (LIPITOR) 80 MG tablet Take 80 mg by mouth daily at 6 PM.     Blood Glucose Monitoring Suppl (ACCU-CHEK AVIVA PLUS) w/Device KIT      brexpiprazole  (REXULTI ) 0.25 MG TABS tablet Take 1 tablet (0.25 mg total) by mouth daily. Take along with 0.5 mg daily , total of 0.75 mg daily. 90 tablet 1   Brexpiprazole  (REXULTI ) 0.5 MG TABS Take 1 tablet (0.5 mg total) by mouth daily. Take along with 0.25 mg , total of 0.75 mg daily 90 tablet 1   Carboxymethylcellulose Sod PF 0.5 % SOLN Apply 1 drop to eye daily as needed.     Cholecalciferol  (VITAMIN D3) 1000 units CAPS Take 1 capsule by mouth daily.     clobetasol ointment (TEMOVATE) 0.05 % Apply topically.     clotrimazole (LOTRIMIN) 1 % cream Apply 1 Application topically.     Continuous Blood Gluc Receiver (DEXCOM G6 RECEIVER) DEVI Use to monitor blood sugar. NDC [id number]     Continuous Blood Gluc Sensor (DEXCOM G6 SENSOR) MISC Use to monitor blood sugar.  Replace every 10 days. NDC [phone].     Continuous Blood Gluc Transmit (DEXCOM G6 TRANSMITTER) MISC Use to monitor blood sugar.  Replace every 3 months. NDC [address]-01.     dabigatran  (PRADAXA ) 150 MG CAPS capsule Take 150 mg by mouth 2 (two) times daily.     dicyclomine  (BENTYL ) 20 MG tablet TAKE 1 TABLET (20 MG TOTAL) BY MOUTH 3 (THREE) TIMES DAILY BEFORE MEALS. 270 tablet 0   DILT-XR 240 MG 24 hr capsule Take 240 mg by mouth daily.     diltiazem  (CARDIZEM  CD) 240 MG 24 hr capsule Take 240 mg by mouth daily.     ezetimibe (ZETIA) 10 MG tablet Take 10 mg by mouth at  bedtime.     famotidine  (PEPCID ) 40 MG tablet Take 40 mg by mouth at bedtime.     ferrous sulfate 325 (65 FE) MG tablet Take by mouth. Take 325 mg daily with breakfast     fexofenadine (ALLEGRA ALLERGY) 180 MG tablet Take 180 mg by mouth daily.     fluticasone  (FLONASE ) 50 MCG/ACT nasal spray Place 1 spray into both nostrils daily. 9.9 mL 0   furosemide (LASIX) 40 MG  tablet TAKE (1) TABLET BY MOUTH EVERY DAY     gabapentin  (NEURONTIN ) 300 MG capsule TAKE 2 CAPSULE IN AM, 1 CAP MID DAY AND 3 CAP BY MOUTH AT BEDTIME     hydrOXYzine  (VISTARIL ) 25 MG capsule Take 1-2 capsules (25-50 mg total) by mouth 2 (two) times daily as needed for anxiety (and sleep). 360 capsule 0   Insulin  Disposable Pump (OMNIPOD 5 DEXG7G6 PODS GEN 5) MISC SMARTSIG:1 SUB-Q Every Other Day     insulin  lispro (HUMALOG KWIKPEN) 200 UNIT/ML KwikPen Inject 125 Units into the skin.     Insulin  Pen Needle (TRUEPLUS PEN NEEDLES) 29G X MISC      lamoTRIgine  (LAMICTAL ) 100 MG tablet Take 100 mg by mouth daily.     levothyroxine  (SYNTHROID ) 112 MCG tablet Take 112 mcg by mouth daily.     lidocaine  4 % 1 patch daily.     magnesium oxide (MAG-OX) 400 MG tablet Take 1 tablet by mouth daily.     Melatonin 10 MG TABS Take 2 tablets by mouth at bedtime.     meloxicam (MOBIC) 15 MG tablet Take 15 mg by mouth daily.     metFORMIN  (GLUCOPHAGE -XR) 500 MG 24 hr tablet Take 500 mg by mouth 2 (two) times daily.      methocarbamol (ROBAXIN) 500 MG tablet Take 500 mg by mouth 2 (two) times daily as needed.     metoCLOPramide  (REGLAN ) 10 MG tablet METOCLOPRAMIDE  HCL 10 MG TABS     metoprolol  succinate (TOPROL -XL) 25 MG 24 hr tablet Take 1 tablet by mouth daily.     mometasone (ELOCON) 0.1 % cream Apply topically.     naloxone (NARCAN) nasal spray 4 mg/0.1 mL Place into the nose.     nitroGLYCERIN  (NITROSTAT ) 0.4 MG SL tablet Place under the tongue.     nystatin (MYCOSTATIN) 100000 UNIT/ML suspension Take 5 mLs by mouth 4 (four) times daily.      omeprazole  (PRILOSEC) 20 MG capsule TAKE (1) CAPSULE BY MOUTH EVERY DAY 30 capsule 0   ondansetron  (ZOFRAN ) 8 MG tablet Take 8 mg by mouth every 8 (eight) hours as needed.     oxyCODONE  (OXY IR/ROXICODONE ) 5 MG immediate release tablet Take 5 mg by mouth 2 (two) times daily as needed.     promethazine  (PHENERGAN ) 12.5 MG tablet Take 12.5 mg by mouth.     QULIPTA 60 MG TABS Take 60 mg by mouth daily.     sertraline  (ZOLOFT ) 100 MG tablet TAKE 2 TABLETS (200 MG TOTAL) BY MOUTH DAILY WITH BREAKFAST. 180 tablet 0   TIADYLT  ER 240 MG 24 hr capsule Take 240 mg by mouth daily.     vitamin B-12 (CYANOCOBALAMIN ) 100 MCG tablet Take 1 tablet (100 mcg total) by mouth daily. 90 tablet 1   Vitamin D , Ergocalciferol , 50 MCG (2000 UT) CAPS TAKE 1 CAPSULE (50,000 UNITS TOTAL) BY MOUTH ONCE A WEEK FOR 56 DAYS     XTAMPZA  ER 9 MG C12A PLEASE SEE ATTACHED FOR DETAILED DIRECTIONS     zaleplon  (SONATA ) 5 MG capsule TAKE 1 CAPSULE (5 MG TOTAL) BY MOUTH AT BEDTIME AS NEEDED FOR SLEEP. 30 capsule 3   Current Facility-Administered Medications on File Prior to Visit  Medication Dose Route Frequency Provider Last Rate Last Admin   iohexol  (OMNIPAQUE ) 300 MG/ML solution    PRN Callwood, Dwayne D, MD   60 mL at 03/10/22 1442    There are no Patient Instructions on file for this visit. No follow-ups  on file.   Alisah Grandberry E Anitha Kreiser, NP

## 2024-02-20 ENCOUNTER — Ambulatory Visit

## 2024-02-20 DIAGNOSIS — M6281 Muscle weakness (generalized): Secondary | ICD-10-CM

## 2024-02-20 DIAGNOSIS — M542 Cervicalgia: Secondary | ICD-10-CM

## 2024-02-22 ENCOUNTER — Ambulatory Visit

## 2024-02-27 ENCOUNTER — Ambulatory Visit (INDEPENDENT_AMBULATORY_CARE_PROVIDER_SITE_OTHER): Admitting: Licensed Clinical Social Worker

## 2024-02-27 DIAGNOSIS — F4310 Post-traumatic stress disorder, unspecified: Secondary | ICD-10-CM | POA: Diagnosis not present

## 2024-02-27 DIAGNOSIS — F411 Generalized anxiety disorder: Secondary | ICD-10-CM

## 2024-02-27 DIAGNOSIS — F3342 Major depressive disorder, recurrent, in full remission: Secondary | ICD-10-CM | POA: Diagnosis not present

## 2024-02-27 DIAGNOSIS — Z634 Disappearance and death of family member: Secondary | ICD-10-CM

## 2024-02-27 NOTE — Progress Notes (Signed)
 THERAPIST PROGRESS NOTE  Virtual Visit via Video Note  I connected with Christine Mcneil on 02/27/24 at  9:00 AM EDT by a video enabled telemedicine application and verified that I am speaking with the correct person using two identifiers.  Location: Patient: Address on file  Provider: Providers Address    I discussed the limitations of evaluation and management by telemedicine and the availability of in person appointments. The patient expressed understanding and agreed to proceed.   I discussed the assessment and treatment plan with the patient. The patient was provided an opportunity to ask questions and all were answered. The patient agreed with the plan and demonstrated an understanding of the instructions.   The patient was advised to call back or seek an in-person evaluation if the symptoms worsen or if the condition fails to improve as anticipated.  I provided 40 minutes of non-face-to-face time during this encounter.   Christine KATHEE Husband, LCSW   Session Time: 60  Participation Level: Active  Behavioral Response: CasualAlertEuthymic  Type of Therapy: Individual Therapy  Treatment Goals addressed:  Active     Anxiety     LTG: Christine Mcneil will score less than 5 on the Generalized Anxiety Disorder 7 Scale (GAD-7)  (Not Progressing)     Start:  08/01/23    Expected End:  04/18/24       Goal Note     01/17/24: Reports her anxiety has been higher as a result of disagreements within her family. Shares she is finding difficulties in remaining outside of the discord.          STG: Christine Mcneil will reduce frequency of avoidant behaviors by 50% as evidenced by self-report in therapy sessions (Not Progressing)     Start:  08/01/23    Expected End:  04/18/24         Perform psychoeducation regarding anxiety disorders     Start:  08/01/23         Work with patient individually to identify the major components of a recent episode of anxiety: physical symptoms, major thoughts and  images, and major behaviors they experienced     Start:  08/01/23         Perform motivational interviewing regarding increasing desire and follow though of fishing excursions.      Start:  08/01/23         Coping Skills      Start:  08/01/23       Will work with the pt using CBT/DBT techniques to help the pt verbalize an understanding of the cognitive, physiological, and behavioral components of anxiety and its treatment. This will be done by using worksheets, interactive activities, CBT/ABC thought logs, modeling, homework, role playing and journaling. Will work with pt to learn and implement coping skills that result in a reduction of anxiety and improve daily functioning per pt self-report 3 out of 5 documented sessions.         OP Depression     LTG: Reduce frequency, intensity, and duration of depression symptoms so that daily functioning is improved (Not Progressing)     Start:  08/01/23    Expected End:  04/18/24       Goal Note     01/16/24: Reports she has places her self care on the back burner due to personal relationships.          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          STG: Christine Mcneil will identify cognitive patterns and beliefs that support depression (Not Progressing)     Start:  08/01/23    Expected End:  04/18/24         STG: help me get over some of this anger towards her relatives and working towards forgiveness.  (Progressing)     Start:  08/01/23    Expected End:  04/18/24       Goal Note     01/17/24: Reports she has experienced progress in forgiving her sister.          STG - Misc 1 (Not Progressing)     Start:  08/01/23    Expected End:  04/18/24      to be  more happy [fishing more, laughing, get out more, hangout with sister]      Demonstrates progress in stages of grief at own pace (Not Progressing)     Start:  08/01/23    Expected End:  04/18/24          Goal Note     01/17/24: Continues to report disturbance as she feels her family is disrespecting her mother with current behaviors.          Work with Christine Mcneil to identify the major components of a recent episode of depression: physical symptoms, major thoughts and images, and major behaviors they experienced     Start:  08/01/23         Christine Mcneil will identify 2 cognitive distortions they are currently using and write reframing statements to replace them     Start:  08/01/23         Coping Skills     Start:  08/01/23       Will work with the pt using CBT/DBT techniques to help the pt verbalize an understanding of the cognitive, physiological, and behavioral components of depression and its treatment. This will be done by using worksheets, interactive activities, CBT/ABC thought logs, modeling, homework, role playing and journaling. Will work with pt to learn and implement coping skills that result in a reduction of depression and improve daily functioning per pt self-report 3 out of 5 documented sessions.       Educate patient on: Stages of grief     Start:  08/01/23             ProgressTowards Goals: Progressing  Interventions: CBT, Supportive, and Reframing  Summary: Christine Mcneil is a 58 y.o. female who presents with anxiety, depression, and bereavement. Patient identifies symptoms including lack of motivation, low mood, trouble falling asleep, low energy, change in appetite, anxious feelings, uncontrollable worry, tension, restlessness, and irritability. Pt was oriented times 5. Pt was cooperative and engaged. Pt denies SI/HI/AVH.    The patient reports improved communication with her spouse, noting that he has become more sympathetic and understanding regarding her pain  levels. She feels that both of them are experiencing improvements in their moods.   However, she continues to express grief about her mother and has been thinking about her more frequently. She shares that January 1st marks the anniversary of her mother's passing, making this the first holiday season she will spend without her.  During the session,  psychoeducation was provided on the stages of grief. The patient processed her misplaced anger towards herself, stemming from feelings of guilt for believing she caused her mother's desire to stay alive for her sake. The clinician worked with the patient to challenge this perspective, using Cognitive Behavioral Therapy (CBT) to help her reframe negative thoughts.  Additionally, the patient processed her anger towards her relatives for their lack of support and their negative remarks about her mother. She reflected on how her life might change if she were to let go of this anger.Action plan: continue to stay off Facebook and limit communication with certain individual.   Suicidal/Homicidal: Nowithout intent/plan  Therapist Response: Clinician utilized active listening to create a safe space for patient to process recent life events. Clinician assessed for current symptoms, stressors, safety since last session. Psychoeducation was provided on the stages of grief. Used CBT to reframe negative thoughts and release unresolved anger. Identified stages she can take to progress through the stages of grief.   Plan: Return again in 2 weeks.  Diagnosis: Recurrent major depressive disorder, in full remission  GAD (generalized anxiety disorder)  PTSD (post-traumatic stress disorder)  Bereavement   Collaboration of Care: AEB psychiatrist can access notes and cln. Will review psychiatrists' notes. Check in with the patient and will see LCSW per availability. Patient agreed with treatment recommendations.   Patient/Guardian was advised Release of Information must  be obtained prior to any record release in order to collaborate their care with an outside provider. Patient/Guardian was advised if they have not already done so to contact the registration department to sign all necessary forms in order for us  to release information regarding their care.   Consent: Patient/Guardian gives verbal consent for treatment and assignment of benefits for services provided during this visit. Patient/Guardian expressed understanding and agreed to proceed.   Christine KATHEE Husband, LCSW 02/27/2024

## 2024-02-28 ENCOUNTER — Ambulatory Visit

## 2024-02-28 LAB — GENECONNECT MOLECULAR SCREEN: Genetic Analysis Overall Interpretation: NEGATIVE

## 2024-03-01 ENCOUNTER — Ambulatory Visit: Attending: Physician Assistant

## 2024-03-01 DIAGNOSIS — M542 Cervicalgia: Secondary | ICD-10-CM | POA: Diagnosis present

## 2024-03-01 DIAGNOSIS — M6281 Muscle weakness (generalized): Secondary | ICD-10-CM | POA: Diagnosis present
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
## 2024-03-06 ENCOUNTER — Ambulatory Visit

## 2024-03-08 ENCOUNTER — Ambulatory Visit
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
## 2024-03-12 ENCOUNTER — Encounter: Payer: Self-pay | Admitting: Gastroenterology

## 2024-03-12 ENCOUNTER — Ambulatory Visit

## 2024-03-12 ENCOUNTER — Ambulatory Visit: Admitting: Licensed Clinical Social Worker

## 2024-03-12 DIAGNOSIS — M542 Cervicalgia: Secondary | ICD-10-CM | POA: Diagnosis not present

## 2024-03-12 DIAGNOSIS — M6281 Muscle weakness (generalized): Secondary | ICD-10-CM

## 2024-03-12 NOTE — Anesthesia Preprocedure Evaluation (Signed)
 Anesthesia Evaluation  Patient identified by MRN, date of birth, ID band Patient awake    Reviewed: Allergy & Precautions, H&P , NPO status , Patient's Chart, lab work & pertinent test results  History of Anesthesia Complications (+) PONV and history of anesthetic complications  Airway Mallampati: III  TM Distance: <3 FB Neck ROM: Full    Dental no notable dental hx.    Pulmonary neg pulmonary ROS, shortness of breath, sleep apnea    Pulmonary exam normal breath sounds clear to auscultation       Cardiovascular hypertension, + angina  + CAD  Normal cardiovascular exam+ dysrhythmias  Rhythm:Regular Rate:Normal  06-21-17 echo Technically difficult study due to chest wall and/or lung    interference.  - Left ventricle: The cavity size was normal. Wall thickness was    increased in a pattern of moderate LVH. Systolic function was    normal. The estimated ejection fraction was in the range of 60%    to 65%. Wall motion was normal; there were no regional wall    motion abnormalities. The study is not technically sufficient to    allow evaluation of LV diastolic function.  - Right ventricle: The cavity size was normal. Systolic function    was normal.  - Limited evaluation of the valvular structures due to suboptimal    windows.   03-10-22 cath   Mid LAD lesion is 50% stenosed.   The left ventricular systolic function is normal.   LV end diastolic pressure is normal.   The left ventricular ejection fraction is 55-65% by visual estimate.   There is no mitral valve regurgitation.   Conclusion Normal left ventricular function EF of 60%   Coronaries Left main normal LAD small 50% mid discrete lesion TIMI-3 flow Circumflex medium normal RCA medium to large no significant disease   No significant obstructive disease Intervention deferred not indicated    Neuro/Psych  Headaches PSYCHIATRIC DISORDERS Anxiety Depression     TIA Neuromuscular disease CVA negative neurological ROS  negative psych ROS   GI/Hepatic negative GI ROS, Neg liver ROS,GERD  ,,  Endo/Other  diabetesHypothyroidism    Renal/GU negative Renal ROS  negative genitourinary   Musculoskeletal negative musculoskeletal ROS (+) Arthritis ,  Fibromyalgia -  Abdominal   Peds negative pediatric ROS (+)  Hematology negative hematology ROS (+) Blood dyscrasia, anemia   Anesthesia Other Findings Note PTSD, anxiety, depression, on meds for chronic pain syndrome Is presently nauseated today, says Zofran  was working but then it quit, but I've been off it for awhile, so I don't know if it will work or not. Will administer barhemsys 5 mg IV preop  Chronic nausea  Diabetes mellitus without complication (HCC) Thyroid disease Hypertension Prothrombin gene mutation Chronic pain Anxiety and depression Migraines TIA (transient ischemic attack) Depression Diabetes mellitus, type II (HCC) Atypical facial pain Fibromyalgia Pseudotumor cerebri Chronic daily headache Hyperlipidemia Right shoulder pain Optic neuropathy Insomnia Left hip pain Low back pain Abdominal pain Rectal bleeding Rotator cuff syndrome Cervical spondylosis without myelopathy Bilateral occipital neuralgia Chronic thoracic back pain Iron  deficiency anemia Dysphagia Numbness OSA (obstructive sleep apnea) Polyarthralgia Primary osteoarthritis of both hips Dysrhythmia Stroke (HCC) Chronic pelvic pain in female Hypothyroidism Complication of anesthesia PONV (postoperative nausea and vomiting) Dyspnea History of kidney stones GERD (gastroesophageal reflux disease) Fatty liver Fibrosis of liver Neuropathy Arthritis of back Spinal cord stimulator status Chronic pain syndrome Generalized anxiety disorder PTSD (post-traumatic stress disorder) Chronic pain syndrome under treatment with controlled substance agreement Chronic  left-sided low back pain without sciatica      Reproductive/Obstetrics negative OB ROS                              Anesthesia Physical Anesthesia Plan  ASA: 3  Anesthesia Plan: General   Post-op Pain Management:    Induction: Intravenous  PONV Risk Score and Plan:   Airway Management Planned: Natural Airway and Nasal Cannula  Additional Equipment:   Intra-op Plan:   Post-operative Plan:   Informed Consent: I have reviewed the patients History and Physical, chart, labs and discussed the procedure including the risks, benefits and alternatives for the proposed anesthesia with the patient or authorized representative who has indicated his/her understanding and acceptance.     Dental Advisory Given  Plan Discussed with: Anesthesiologist, CRNA and Surgeon  Anesthesia Plan Comments: (Patient consented for risks of anesthesia including but not limited to:  - adverse reactions to medications - risk of airway placement if required - damage to eyes, teeth, lips or other oral mucosa - nerve damage due to positioning  - sore throat or hoarseness - Damage to heart, brain, nerves, lungs, other parts of body or loss of life  Patient voiced understanding and assent.)         Anesthesia Quick Evaluation

## 2024-03-13 ENCOUNTER — Ambulatory Visit: Payer: Self-pay | Admitting: Anesthesiology

## 2024-03-13 ENCOUNTER — Encounter
Admission: RE | Disposition: A | Discharge status: Home / Self Care | Payer: Self-pay | Source: Home / Self Care | Attending: Gastroenterology

## 2024-03-13 ENCOUNTER — Other Ambulatory Visit: Payer: Self-pay

## 2024-03-13 ENCOUNTER — Ambulatory Visit
Admission: RE | Admit: 2024-03-13 | Discharge: 2024-03-13 | Disposition: A | Discharge status: Home / Self Care | Attending: Gastroenterology | Admitting: Gastroenterology

## 2024-03-13 ENCOUNTER — Encounter: Payer: Self-pay | Admitting: Gastroenterology

## 2024-03-13 DIAGNOSIS — M1990 Unspecified osteoarthritis, unspecified site: Secondary | ICD-10-CM | POA: Insufficient documentation

## 2024-03-13 DIAGNOSIS — E039 Hypothyroidism, unspecified: Secondary | ICD-10-CM | POA: Diagnosis not present

## 2024-03-13 DIAGNOSIS — E119 Type 2 diabetes mellitus without complications: Secondary | ICD-10-CM | POA: Insufficient documentation

## 2024-03-13 DIAGNOSIS — Z794 Long term (current) use of insulin: Secondary | ICD-10-CM | POA: Diagnosis not present

## 2024-03-13 DIAGNOSIS — R0602 Shortness of breath: Secondary | ICD-10-CM | POA: Insufficient documentation

## 2024-03-13 DIAGNOSIS — K219 Gastro-esophageal reflux disease without esophagitis: Secondary | ICD-10-CM | POA: Diagnosis not present

## 2024-03-13 DIAGNOSIS — Z7984 Long term (current) use of oral hypoglycemic drugs: Secondary | ICD-10-CM | POA: Diagnosis not present

## 2024-03-13 DIAGNOSIS — G4730 Sleep apnea, unspecified: Secondary | ICD-10-CM | POA: Diagnosis not present

## 2024-03-13 DIAGNOSIS — Z7951 Long term (current) use of inhaled steroids: Secondary | ICD-10-CM | POA: Insufficient documentation

## 2024-03-13 DIAGNOSIS — R519 Headache, unspecified: Secondary | ICD-10-CM | POA: Insufficient documentation

## 2024-03-13 DIAGNOSIS — M797 Fibromyalgia: Secondary | ICD-10-CM | POA: Insufficient documentation

## 2024-03-13 DIAGNOSIS — R1013 Epigastric pain: Secondary | ICD-10-CM | POA: Diagnosis not present

## 2024-03-13 DIAGNOSIS — I25119 Atherosclerotic heart disease of native coronary artery with unspecified angina pectoris: Secondary | ICD-10-CM | POA: Insufficient documentation

## 2024-03-13 DIAGNOSIS — Z79890 Hormone replacement therapy (postmenopausal): Secondary | ICD-10-CM | POA: Insufficient documentation

## 2024-03-13 DIAGNOSIS — R112 Nausea with vomiting, unspecified: Secondary | ICD-10-CM | POA: Diagnosis not present

## 2024-03-13 DIAGNOSIS — Z79899 Other long term (current) drug therapy: Secondary | ICD-10-CM | POA: Diagnosis not present

## 2024-03-13 DIAGNOSIS — D649 Anemia, unspecified: Secondary | ICD-10-CM | POA: Diagnosis not present

## 2024-03-13 DIAGNOSIS — I10 Essential (primary) hypertension: Secondary | ICD-10-CM | POA: Diagnosis not present

## 2024-03-13 DIAGNOSIS — F419 Anxiety disorder, unspecified: Secondary | ICD-10-CM | POA: Diagnosis not present

## 2024-03-13 DIAGNOSIS — K3189 Other diseases of stomach and duodenum: Secondary | ICD-10-CM | POA: Insufficient documentation

## 2024-03-13 DIAGNOSIS — F32A Depression, unspecified: Secondary | ICD-10-CM | POA: Insufficient documentation

## 2024-03-13 DIAGNOSIS — R1110 Vomiting, unspecified: Secondary | ICD-10-CM | POA: Diagnosis present

## 2024-03-13 DIAGNOSIS — R110 Nausea: Secondary | ICD-10-CM | POA: Diagnosis present

## 2024-03-13 HISTORY — DX: Polyneuropathy, unspecified: G62.9

## 2024-03-13 HISTORY — DX: Other complications of anesthesia, initial encounter: T88.59XA

## 2024-03-13 HISTORY — DX: Spondylosis, unspecified: M47.9

## 2024-03-13 HISTORY — DX: Nausea with vomiting, unspecified: Z98.890

## 2024-03-13 HISTORY — DX: Chronic pain syndrome: G89.4

## 2024-03-13 HISTORY — DX: Low back pain, unspecified: G89.29

## 2024-03-13 HISTORY — DX: Fatty (change of) liver, not elsewhere classified: K76.0

## 2024-03-13 HISTORY — DX: Post-traumatic stress disorder, unspecified: F43.10

## 2024-03-13 HISTORY — PX: ESOPHAGOGASTRODUODENOSCOPY: SHX5428

## 2024-03-13 HISTORY — DX: Hepatic fibrosis, unspecified: K74.00

## 2024-03-13 HISTORY — DX: Presence of other specified functional implants: Z96.89

## 2024-03-13 HISTORY — DX: Dyspnea, unspecified: R06.00

## 2024-03-13 HISTORY — DX: Personal history of urinary calculi: Z87.442

## 2024-03-13 HISTORY — DX: Generalized anxiety disorder: F41.1

## 2024-03-13 HISTORY — DX: Atherosclerotic heart disease of native coronary artery without angina pectoris: I25.10

## 2024-03-13 HISTORY — DX: Gastro-esophageal reflux disease without esophagitis: K21.9

## 2024-03-13 LAB — GLUCOSE, CAPILLARY
Glucose-Capillary: 265 mg/dL — ABNORMAL HIGH (ref 70–99)
Glucose-Capillary: 217 mg/dL — ABNORMAL HIGH (ref 70–99)

## 2024-03-13 SURGERY — EGD (ESOPHAGOGASTRODUODENOSCOPY)
Anesthesia: General | Site: Mouth

## 2024-03-13 MED ORDER — LIDOCAINE HCL (CARDIAC) PF 100 MG/5ML IV SOSY
PREFILLED_SYRINGE | INTRAVENOUS | Status: DC | PRN
  Administered 2024-03-13: 6 mg via INTRAVENOUS

## 2024-03-13 MED ORDER — PROPOFOL 10 MG/ML IV BOLUS
INTRAVENOUS | Status: DC | PRN
  Administered 2024-03-13 (×2): 50 mg via INTRAVENOUS

## 2024-03-13 MED ORDER — AMISULPRIDE (ANTIEMETIC) 5 MG/2ML IV SOLN
INTRAVENOUS | Status: AC
  Filled 2024-03-13: qty 2

## 2024-03-13 MED ORDER — STERILE WATER FOR IRRIGATION IR SOLN
Status: DC | PRN
  Administered 2024-03-13: 1

## 2024-03-13 MED ORDER — INSULIN REGULAR HUMAN 100 UNIT/ML IJ SOLN
5.0000 [IU] | Freq: Once | INTRAMUSCULAR | Status: AC
  Administered 2024-03-13: 5 [IU] via INTRAVENOUS

## 2024-03-13 MED ORDER — INSULIN REGULAR HUMAN 100 UNIT/ML IJ SOLN
INTRAMUSCULAR | Status: AC
  Filled 2024-03-13: qty 5

## 2024-03-13 MED ORDER — GLYCOPYRROLATE 0.2 MG/ML IJ SOLN
INTRAMUSCULAR | Status: DC | PRN
  Administered 2024-03-13: .2 mg via INTRAVENOUS

## 2024-03-13 MED ORDER — LACTATED RINGERS IV SOLN: INTRAVENOUS | Status: DC

## 2024-03-13 MED ORDER — AMISULPRIDE (ANTIEMETIC) 5 MG/2ML IV SOLN
5.0000 mg | Freq: Once | INTRAVENOUS | Status: AC
Start: 2024-03-13 — End: 2024-03-13
  Administered 2024-03-13: 5 mg via INTRAVENOUS

## 2024-03-13 MED ORDER — PROPOFOL 500 MG/50ML IV EMUL
INTRAVENOUS | Status: DC | PRN
  Administered 2024-03-13: 140 ug/kg/min via INTRAVENOUS

## 2024-03-13 SURGICAL SUPPLY — 6 items
BLOCK BITE 60FR ADLT L/F GRN (MISCELLANEOUS) ×1 IMPLANT
FORCEPS BIOP RAD 4 LRG CAP 4 (CUTTING FORCEPS) IMPLANT
GOWN CVR UNV OPN BCK APRN NK (MISCELLANEOUS) ×2 IMPLANT
KIT PRC NS LF DISP ENDO (KITS) ×1 IMPLANT
MANIFOLD NEPTUNE II (INSTRUMENTS) ×1 IMPLANT
WATER STERILE IRR 250ML POUR (IV SOLUTION) ×1 IMPLANT

## 2024-03-13 NOTE — Op Note (Signed)
 [facility] Gastroenterology Patient Name: Berania Peedin Procedure Date: 03/13/2024 10:02 AM MRN: [id number] Account #: [id number] Date of Birth: 09-29-1965 Admit Type: Outpatient Age: 58 Room: [facility] OR ROOM 01 Gender: Female Note Status: Finalized Instrument Name: Endoscope [id number] Procedure:             Upper GI endoscopy Indications:           Epigastric abdominal pain, Nausea with vomiting Providers:             Corinn Jess Brooklyn MD, MD Medicines:             General Anesthesia Complications:         No immediate complications. Estimated blood loss: None. Procedure:             Pre-Anesthesia Assessment:                        - Prior to the procedure, a History and Physical was                         performed, and patient medications and allergies were                         reviewed. The patient is competent. The risks and                         benefits of the procedure and the sedation options and                         risks were discussed with the patient. All questions                         were answered and informed consent was obtained.                         Patient identification and proposed procedure were                         verified by the physician, the nurse, the                         anesthesiologist, the anesthetist and the technician                         in the pre-procedure area in the procedure room in the                         endoscopy suite. Mental Status Examination: alert and                         oriented. Airway Examination: normal oropharyngeal                         airway and neck mobility. Respiratory Examination:                         clear to auscultation. CV Examination: normal.  Prophylactic Antibiotics: The patient does not require                         prophylactic antibiotics. Prior Anticoagulants: The                         patient has taken no anticoagulant or antiplatelet                          agents. ASA Grade Assessment: III - A patient with                         severe systemic disease. After reviewing the risks and                         benefits, the patient was deemed in satisfactory                         condition to undergo the procedure. The anesthesia                         plan was to use general anesthesia. Immediately prior                         to administration of medications, the patient was                         re-assessed for adequacy to receive sedatives. The                         heart rate, respiratory rate, oxygen saturations,                         blood pressure, adequacy of pulmonary ventilation, and                         response to care were monitored throughout the                         procedure. The physical status of the patient was                         re-assessed after the procedure.                        After obtaining informed consent, the endoscope was                         passed under direct vision. Throughout the procedure,                         the patient's blood pressure, pulse, and oxygen                         saturations were monitored continuously. The Endoscope                         was introduced through the mouth, and advanced to the  second part of duodenum. The upper GI endoscopy was                         accomplished without difficulty. The patient tolerated                         the procedure well. Findings:      The duodenal bulb and second portion of the duodenum were normal.       Biopsies were taken with a cold forceps for histology.      Diffuse moderately erythematous mucosa without bleeding was found in the       entire examined stomach. Biopsies were taken with a cold forceps for       Helicobacter pylori testing.      The cardia and gastric fundus were normal on retroflexion.      The gastroesophageal junction and examined esophagus were  normal. Impression:            - Normal duodenal bulb and second portion of the                         duodenum. Biopsied.                        - Erythematous mucosa in the stomach. Biopsied.                        - Normal gastroesophageal junction and esophagus. Recommendation:        - Await pathology results.                        - Discharge patient to home (with escort).                        - Resume previous diet daily.                        - Continue present medications. Procedure Code(s):     --- Professional ---                        [phone], Esophagogastroduodenoscopy, flexible,                         transoral; with biopsy, single or multiple Diagnosis Code(s):     --- Professional ---                        K31.89, Other diseases of stomach and duodenum                        R11.2, Nausea with vomiting, unspecified                        R10.13, Epigastric pain CPT copyright 2022 American Medical Association. All rights reserved. The codes documented in this report are preliminary and upon coder review may  be revised to meet current compliance requirements. Dr. Corinn Brooklyn Corinn Jess Brooklyn MD, MD 03/13/2024 10:21:37 AM This report has been signed electronically. Number of Addenda: 0 Note Initiated On: 03/13/2024 10:02 AM Total Procedure Duration: 0 hours 5 minutes 40 seconds  Estimated Blood Loss:  Estimated  blood loss: none.      [facility]

## 2024-03-13 NOTE — Anesthesia Postprocedure Evaluation (Signed)
 Anesthesia Post Note  Patient: CLIFTON KOVACIC  Procedure(s) Performed: EGD (ESOPHAGOGASTRODUODENOSCOPY) WITH BIOPSY (Mouth)  Patient location during evaluation: PACU Anesthesia Type: General Level of consciousness: awake and alert Pain management: pain level controlled Vital Signs Assessment: post-procedure vital signs reviewed and stable Respiratory status: spontaneous breathing, nonlabored ventilation, respiratory function stable and patient connected to nasal cannula oxygen Cardiovascular status: blood pressure returned to baseline and stable Postop Assessment: no apparent nausea or vomiting Anesthetic complications: no   No notable events documented.   Last Vitals:  Vitals:   03/13/24 1030 03/13/24 1035  BP: 126/77 126/77  Pulse: 87 85  Resp: (!) 21 (!) 22  Temp:  (!) 36.4 C  SpO2: 92% 94%    Last Pain:  Vitals:   03/13/24 1035  TempSrc:   PainSc: 0-No pain                 Rilan Eiland C Jahmal Dunavant

## 2024-03-13 NOTE — Transfer of Care (Signed)
 Immediate Anesthesia Transfer of Care Note  Patient: Christine Mcneil  Procedure(s) Performed: EGD (ESOPHAGOGASTRODUODENOSCOPY) WITH BIOPSY (Mouth)  Patient Location: PACU  Anesthesia Type: General  Level of Consciousness: awake, alert  and patient cooperative  Airway and Oxygen Therapy: Patient Spontanous Breathing and Patient connected to supplemental oxygen  Post-op Assessment: Post-op Vital signs reviewed, Patient's Cardiovascular Status Stable, Respiratory Function Stable, Patent Airway and No signs of Nausea or vomiting  Post-op Vital Signs: Reviewed and stable  Complications: No notable events documented.

## 2024-03-13 NOTE — H&P (Signed)
 Christine JONELLE Brooklyn, MD [facility] Gastroenterology, [facility] [address]  [place], [place] [address]  Main: [phone] Fax:  [phone] Pager: [phone]   Primary Care Physician:  Doughton, Norleen HERO, MD Primary Gastroenterologist:  Dr. Corinn JONELLE Mcneil  Pre-Procedure History & Physical: HPI:  Christine Mcneil is a 58 y.o. female is here for an endoscopy.   Past Medical History:  Diagnosis Date   Abdominal pain    Anxiety and depression    Arthritis of back    Atypical facial pain    Bilateral occipital neuralgia    CAD (coronary artery disease)    Cervical spondylosis without myelopathy    Chronic daily headache    Chronic left-sided low back pain without sciatica    Chronic pain    Chronic pain syndrome    Chronic pain syndrome under treatment with controlled substance agreement    Chronic pelvic pain in female    Chronic thoracic back pain    Complication of anesthesia    Depression    Diabetes mellitus without complication (HCC)    Diabetes mellitus, type II (HCC)    Dysphagia    Dyspnea    Dysrhythmia    Fatty liver    Fibromyalgia    Fibrosis of liver    Generalized anxiety disorder    GERD (gastroesophageal reflux disease)    History of kidney stones    has small one currently   Hyperlipidemia    Hypertension    Hypothyroidism    Insomnia    Iron  deficiency anemia    Left hip pain    Low back pain    Migraines    Neuropathy    Numbness    Optic neuropathy    OSA (obstructive sleep apnea)    not using cpap   Polyarthralgia    PONV (postoperative nausea and vomiting)    Primary osteoarthritis of both hips    Prothrombin gene mutation    Pseudotumor cerebri    PTSD (post-traumatic stress disorder)    Rectal bleeding    Right shoulder pain    Rotator cuff syndrome    Spinal cord stimulator status    Stroke (HCC)    10 years ago   Thyroid disease    TIA (transient ischemic attack) 05/27/2017    Past Surgical History:  Procedure  Laterality Date   ABDOMINAL HYSTERECTOMY     total   CHOLECYSTECTOMY     COLONOSCOPY WITH PROPOFOL  N/A 03/21/2018   Procedure: COLONOSCOPY WITH PROPOFOL ;  Surgeon: Jinny Carmine, MD;  Location: [facility] ENDOSCOPY;  Service: Endoscopy;  Laterality: N/A;   ERCP N/A 08/19/2020   Procedure: ENDOSCOPIC RETROGRADE CHOLANGIOPANCREATOGRAPHY (ERCP);  Surgeon: Jinny Carmine, MD;  Location[facility] ENDOSCOPY;  Service: Endoscopy;  Laterality: N/A;   ESOPHAGOGASTRODUODENOSCOPY (EGD) WITH PROPOFOL  N/A 03/21/2018   Procedure: ESOPHAGOGASTRODUODENOSCOPY (EGD) WITH PROPOFOL ;  Surgeon: Jinny Carmine, MD;  Location: [facility] ENDOSCOPY;  Service: Endoscopy;  Laterality: N/A;   GIVENS CAPSULE STUDY N/A 09/24/2019   Procedure: GIVENS CAPSULE STUDY;  Surgeon: Janalyn Keene NOVAK, MD;  Location: [facility] ENDOSCOPY;  Service: Endoscopy;  Laterality: N/A;   KNEE SURGERY Left    LEFT HEART CATH AND CORONARY ANGIOGRAPHY N/A 03/10/2022   Procedure: LEFT HEART CATH AND CORONARY ANGIOGRAPHY;  Surgeon: Florencio Cara BIRCH, MD;  Location: [facility] INVASIVE CV LAB;  Service: Cardiovascular;  Laterality: N/A;   ROTATOR CUFF REPAIR Right    spinal neurostimulator      Prior to Admission medications   Medication Sig  Start Date End Date Taking? Authorizing Provider  ACCU-CHEK AVIVA PLUS test strip  03/27/18   [provider]  ACCU-CHEK SOFTCLIX LANCETS lancets  11/07/17   [provider]  Acetaminophen  (TYLENOL  ARTHRITIS EXT RELIEF PO) Take 600 mg by mouth daily.    [provider]  albuterol  (VENTOLIN  HFA) 108 (90 Base) MCG/ACT inhaler Inhale 2 puffs into the lungs every 6 (six) hours as needed for wheezing or shortness of breath. 03/08/19   Delores Raford SAILOR, MD  atorvastatin  (LIPITOR) 80 MG tablet Take 80 mg by mouth daily at 6 PM. 12/22/17   [provider]  Blood Glucose Monitoring Suppl (ACCU-CHEK AVIVA PLUS) w/Device KIT  11/07/17   [provider]  brexpiprazole  (REXULTI ) 0.25 MG TABS tablet Take 1  tablet (0.25 mg total) by mouth daily. Take along with 0.5 mg daily , total of 0.75 mg daily. 10/10/23   Eappen, Saramma, MD  Brexpiprazole  (REXULTI ) 0.5 MG TABS Take 1 tablet (0.5 mg total) by mouth daily. Take along with 0.25 mg , total of 0.75 mg daily 10/10/23   Eappen, Saramma, MD  Carboxymethylcellulose Sod PF 0.5 % SOLN Apply 1 drop to eye daily as needed. 05/15/15   [provider]  Cholecalciferol  (VITAMIN D3) 1000 units CAPS Take 1 capsule by mouth daily.    [provider]  clobetasol ointment (TEMOVATE) 0.05 % Apply topically. 06/16/21   [provider]  clotrimazole (LOTRIMIN) 1 % cream Apply 1 Application topically. 12/05/17   [provider]  Continuous Blood Gluc Receiver (DEXCOM G6 RECEIVER) DEVI Use to monitor blood sugar. [facility] [address]-88 08/07/21   [provider]  Continuous Blood Gluc Sensor (DEXCOM G6 SENSOR) MISC Use to monitor blood sugar.  Replace every 10 days. [facility] [address]-96. 08/07/21   [provider]  Continuous Blood Gluc Transmit (DEXCOM G6 TRANSMITTER) MISC Use to monitor blood sugar.  Replace every 3 months. [facility] [id number]. 08/07/21   [provider]  dabigatran  (PRADAXA ) 150 MG CAPS capsule Take 150 mg by mouth 2 (two) times daily. 10/12/16 02/08/24  [provider]  dicyclomine  (BENTYL ) 20 MG tablet TAKE 1 TABLET (20 MG TOTAL) BY MOUTH 3 (THREE) TIMES DAILY BEFORE MEALS. 08/29/23   Jinny Carmine, MD  DILT-XR 240 MG 24 hr capsule Take 240 mg by mouth daily. 08/17/22   [provider]  diltiazem  (CARDIZEM  CD) 240 MG 24 hr capsule Take 240 mg by mouth daily. 11/23/23   [provider]  ezetimibe (ZETIA) 10 MG tablet Take 10 mg by mouth at bedtime.    [provider]  famotidine  (PEPCID ) 40 MG tablet Take 40 mg by mouth at bedtime. 10/10/19   [provider]  ferrous sulfate 325 (65 FE) MG tablet Take by mouth. Take 325 mg daily with breakfast    [provider]   fexofenadine (ALLEGRA ALLERGY) 180 MG tablet Take 180 mg by mouth daily. 07/14/18   [provider]  fluticasone  (FLONASE ) 50 MCG/ACT nasal spray Place 1 spray into both nostrils daily. 08/11/23 08/10/24  Ward, Josette SAILOR, DO  furosemide (LASIX) 40 MG tablet TAKE (1) TABLET BY MOUTH EVERY DAY 11/17/21   [provider]  gabapentin  (NEURONTIN ) 300 MG capsule TAKE 2 CAPSULE IN AM, 1 CAP MID DAY AND 3 CAP BY MOUTH AT BEDTIME 09/01/23   [provider]  hydrOXYzine  (VISTARIL ) 25 MG capsule Take 1-2 capsules (25-50 mg total) by mouth 2 (two) times daily as needed for anxiety (and sleep). 01/10/24  Eappen, Saramma, MD  Insulin  Disposable Pump (OMNIPOD 5 DEXG7G6 PODS GEN 5) MISC SMARTSIG:1 SUB-Q Every Other Day 10/08/23   [provider]  insulin  lispro (HUMALOG KWIKPEN) 200 UNIT/ML KwikPen Inject 125 Units into the skin. 10/04/23   [provider]  Insulin  Pen Needle (TRUEPLUS PEN NEEDLES) 29G X MISC  10/22/21   [provider]  lamoTRIgine  (LAMICTAL ) 100 MG tablet Take 100 mg by mouth daily. 09/21/23   [provider]  levothyroxine  (SYNTHROID ) 112 MCG tablet Take 112 mcg by mouth daily. 09/09/22   [provider]  lidocaine  4 % 1 patch daily. 06/30/22   [provider]  magnesium oxide (MAG-OX) 400 MG tablet Take 1 tablet by mouth daily. 12/03/20   [provider]  Melatonin 10 MG TABS Take 2 tablets by mouth at bedtime. 08/15/19   [provider]  meloxicam (MOBIC) 15 MG tablet Take 15 mg by mouth daily. 06/09/20   [provider]  metFORMIN  (GLUCOPHAGE -XR) 500 MG 24 hr tablet Take 500 mg by mouth 2 (two) times daily.  02/17/18   [provider]  methocarbamol (ROBAXIN) 500 MG tablet Take 500 mg by mouth 2 (two) times daily as needed. 06/06/23   [provider]  metoCLOPramide  (REGLAN ) 10 MG tablet METOCLOPRAMIDE  HCL 10 MG TABS 12/15/23   [provider]  metoprolol  succinate  (TOPROL -XL) 25 MG 24 hr tablet Take 1 tablet by mouth daily. 05/13/20   [provider]  mometasone (ELOCON) 0.1 % cream Apply topically. 05/07/21   [provider]  naloxone [facility]) nasal spray 4 mg/0.1 mL Place into the nose. 01/14/21   [provider]  nitroGLYCERIN  (NITROSTAT ) 0.4 MG SL tablet Place under the tongue. 02/02/21 02/08/24  [provider]  nystatin (MYCOSTATIN) 100000 UNIT/ML suspension Take 5 mLs by mouth 4 (four) times daily. 09/06/19   [provider]  omeprazole  (PRILOSEC) 20 MG capsule TAKE (1) CAPSULE BY MOUTH EVERY DAY 08/30/19   Janalyn Keene NOVAK, MD  ondansetron  (ZOFRAN ) 8 MG tablet Take 8 mg by mouth every 8 (eight) hours as needed.    [provider]  oxyCODONE  (OXY IR/ROXICODONE ) 5 MG immediate release tablet Take 5 mg by mouth 2 (two) times daily as needed. 09/27/23   [provider]  promethazine  (PHENERGAN ) 12.5 MG tablet Take 12.5 mg by mouth. 12/15/23   [provider]  QULIPTA 60 MG TABS Take 60 mg by mouth daily.    [provider]  sertraline  (ZOLOFT ) 100 MG tablet TAKE 2 TABLETS (200 MG TOTAL) BY MOUTH DAILY WITH BREAKFAST. 12/27/23 03/26/24  Vickey Mettle, MD  TIADYLT  ER 240 MG 24 hr capsule Take 240 mg by mouth daily. 08/19/23   [provider]  vitamin B-12 (CYANOCOBALAMIN ) 100 MCG tablet Take 1 tablet (100 mcg total) by mouth daily. 07/17/21   Dasie Tinnie MATSU, NP  Vitamin D , Ergocalciferol , 50 MCG (2000 UT) CAPS TAKE 1 CAPSULE (50,000 UNITS TOTAL) BY MOUTH ONCE A WEEK FOR 56 DAYS    [provider]  XTAMPZA  ER 9 MG C12A PLEASE SEE ATTACHED FOR DETAILED DIRECTIONS 06/16/21   [provider]  zaleplon  (SONATA ) 5 MG capsule TAKE 1 CAPSULE (5 MG TOTAL) BY MOUTH AT BEDTIME AS NEEDED FOR SLEEP. 12/05/23 04/03/24  Eappen, Saramma, MD    Allergies as of 02/28/2024 - Review Complete 02/21/2024  Allergen Reaction Noted   Semaglutide Other (See Comments) 01/02/2020    Amoxapine Itching 09/16/2015   Amoxicillin Itching  Dapagliflozin Itching 11/07/2017   Keflex [cephalexin] Itching 09/16/2015   Mirtazapine  Other (See Comments) 06/08/2019    Family History  Problem Relation Age of Onset   Diabetes Mother        Mother passed away on 06-23-23.   Hyperlipidemia Mother    Hypertension Mother    COPD Mother    CVA Mother    Anemia Mother    Diabetes Father    Hyperlipidemia Father    Hypertension Father    Thyroid disease Father    Alcohol abuse Father    Peripheral vascular disease Sister    Hypertension Sister    Anxiety disorder Son    Depression Son    Bipolar disorder Son    Seizures Son     Social History   Socioeconomic History   Marital status: Divorced    Spouse name: Not on file   Number of children: 1   Years of education: Not on file   Highest education level: High school graduate  Occupational History    Comment: disablitiy  Tobacco Use   Smoking status: Never    Passive exposure: Never   Smokeless tobacco: Never  Vaping Use   Vaping status: Never Used  Substance and Sexual Activity   Alcohol use: No   Drug use: Never   Sexual activity: Not Currently  Other Topics Concern   Not on file  Social History Narrative   Not on file   Social Drivers of Health   Financial Resource Strain: Low Risk  (10/11/2023)   Received from [facility]   Overall Financial Resource Strain (CARDIA)    Difficulty of Paying Living Expenses: Not hard at all  Food Insecurity: No Food Insecurity (10/11/2023)   Received from [facility]   Hunger Vital Sign    Within the past 12 months, you worried that your food would run out before you got the money to buy more.: Never true    Within the past 12 months, the food you bought just didn't last and you didn't have money to get more.: Never true  Transportation Needs: No Transportation Needs (10/11/2023)   Received from [facility] - Transportation    In the past 12 months, has lack of transportation kept you from medical appointments or from getting medications?: No    Lack of Transportation (Non-Medical): No  Physical Activity: Inactive (07/19/2017)   Exercise Vital Sign    Days of Exercise per Week: 0 days    Minutes of Exercise per Session: 0 min  Stress: Stress Concern Present (07/19/2017)   [employer] of Occupational Health - Occupational Stress Questionnaire    Feeling of Stress : Very much  Social Connections: Moderately Isolated (07/19/2017)   Social Connection and Isolation Panel    Frequency of Communication with Friends and Family: Once a week    Frequency of Social Gatherings with Friends and Family: Never    Attends Religious Services: More than 4 times per year    Active Member of [employer] or Organizations: No    Attends Banker Meetings: Never    Marital Status: Divorced  Catering manager Violence: Not At Risk (07/19/2017)   Humiliation, Afraid, Rape, and Kick questionnaire    Fear of Current or Ex-Partner: No    Emotionally Abused: No    Physically Abused: No    Sexually Abused: No    Review of Systems: See HPI, otherwise negative ROS  Physical Exam:  BP 131/78   Pulse 89   Temp 98.6 F (37 C) (Temporal)   Resp 12   Ht 5' 6 (1.676 m)   Wt 109.3 kg   LMP  (LMP Unknown)   SpO2 93%   BMI 38.90 kg/m  General:   Alert,  pleasant and cooperative in NAD Head:  Normocephalic and atraumatic. Neck:  Supple; no masses or thyromegaly. Lungs:  Clear throughout to auscultation.    Heart:  Regular rate and rhythm. Abdomen:  Soft, nontender and nondistended. Normal bowel sounds, without guarding, and without rebound.   Neurologic:  Alert and  oriented x4;  grossly normal neurologically.  Impression/Plan: Christine Mcneil is here for an endoscopy to be performed for Abdominal pain with nausea and vomiting   Risks, benefits, limitations, and alternatives regarding  endoscopy  have been reviewed with the patient.  Questions have been answered.  All parties agreeable.   Christine Brooklyn, MD  03/13/2024, 9:29 AM

## 2024-03-15 ENCOUNTER — Ambulatory Visit

## 2024-03-15 DIAGNOSIS — M542 Cervicalgia: Secondary | ICD-10-CM

## 2024-03-15 DIAGNOSIS — M6281 Muscle weakness (generalized): Secondary | ICD-10-CM

## 2024-03-15 LAB — SURGICAL PATHOLOGY
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
## 2024-03-16 ENCOUNTER — Ambulatory Visit: Payer: Self-pay | Admitting: Gastroenterology

## 2024-03-19 ENCOUNTER — Other Ambulatory Visit: Payer: Self-pay | Admitting: Gastroenterology

## 2024-03-19 DIAGNOSIS — R110 Nausea: Secondary | ICD-10-CM

## 2024-03-19 DIAGNOSIS — R1013 Epigastric pain: Secondary | ICD-10-CM

## 2024-03-19 DIAGNOSIS — R1110 Vomiting, unspecified: Secondary | ICD-10-CM

## 2024-03-22 ENCOUNTER — Ambulatory Visit

## 2024-03-23 ENCOUNTER — Other Ambulatory Visit: Payer: Self-pay | Admitting: Psychiatry

## 2024-03-23 DIAGNOSIS — F411 Generalized anxiety disorder: Secondary | ICD-10-CM

## 2024-03-23 DIAGNOSIS — F4310 Post-traumatic stress disorder, unspecified: Secondary | ICD-10-CM

## 2024-03-26 ENCOUNTER — Ambulatory Visit: Admitting: Licensed Clinical Social Worker

## 2024-03-27 ENCOUNTER — Ambulatory Visit: Attending: Physician Assistant

## 2024-03-27 ENCOUNTER — Encounter: Payer: Self-pay | Admitting: Licensed Clinical Social Worker

## 2024-03-27 DIAGNOSIS — M542 Cervicalgia: Secondary | ICD-10-CM | POA: Diagnosis present

## 2024-03-27 DIAGNOSIS — M6281 Muscle weakness (generalized): Secondary | ICD-10-CM | POA: Diagnosis present
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
## 2024-03-29 ENCOUNTER — Ambulatory Visit
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
## 2024-04-02 ENCOUNTER — Ambulatory Visit (HOSPITAL_BASED_OUTPATIENT_CLINIC_OR_DEPARTMENT_OTHER): Attending: Pulmonary Disease | Admitting: Pulmonary Disease

## 2024-04-04 ENCOUNTER — Encounter: Payer: Self-pay | Admitting: Psychiatry

## 2024-04-04 ENCOUNTER — Telehealth (INDEPENDENT_AMBULATORY_CARE_PROVIDER_SITE_OTHER): Admitting: Psychiatry

## 2024-04-04 DIAGNOSIS — F411 Generalized anxiety disorder: Secondary | ICD-10-CM

## 2024-04-04 DIAGNOSIS — F4310 Post-traumatic stress disorder, unspecified: Secondary | ICD-10-CM | POA: Diagnosis not present

## 2024-04-04 DIAGNOSIS — G4701 Insomnia due to medical condition: Secondary | ICD-10-CM

## 2024-04-04 DIAGNOSIS — F3342 Major depressive disorder, recurrent, in full remission: Secondary | ICD-10-CM | POA: Diagnosis not present

## 2024-04-04 MED ORDER — ZALEPLON 5 MG PO CAPS: 5.0000 mg | ORAL_CAPSULE | Freq: Every evening | ORAL | 3 refills | Status: AC | PRN

## 2024-04-04 MED ORDER — HYDROXYZINE PAMOATE 25 MG PO CAPS: 25.0000 mg | ORAL_CAPSULE | Freq: Two times a day (BID) | ORAL | 1 refills | Status: AC | PRN

## 2024-04-04 MED ORDER — SERTRALINE HCL 100 MG PO TABS
200.0000 mg | ORAL_TABLET | Freq: Every day | ORAL | 1 refills | Status: AC
Start: 2024-04-04 — End: 2024-10-01

## 2024-04-04 MED ORDER — BREXPIPRAZOLE 0.25 MG PO TABS: 0.2500 mg | ORAL_TABLET | Freq: Every day | ORAL | 1 refills | Status: AC

## 2024-04-04 MED ORDER — REXULTI 0.5 MG PO TABS: 0.5000 mg | ORAL_TABLET | Freq: Every day | ORAL | 1 refills | Status: AC

## 2024-04-04 NOTE — Progress Notes (Signed)
 Virtual Visit via Video Note  I connected with Christine Mcneil on 04/04/24 at  9:30 AM EST by a video enabled telemedicine application and verified that I am speaking with the correct person using two identifiers.  Location Provider Location : [facility] Patient Location : Home  Participants: Patient , Provider   I discussed the limitations of evaluation and management by telemedicine and the availability of in person appointments. The patient expressed understanding and agreed to proceed.  I discussed the assessment and treatment plan with the patient. The patient was provided an opportunity to ask questions and all were answered. The patient agreed with the plan and demonstrated an understanding of the instructions.   The patient was advised to call back or seek an in-person evaluation if the symptoms worsen or if the condition fails to improve as anticipated.  BH MD OP Progress Note  04/04/2024 9:56 AM Christine Mcneil  MRN:  [id number]  Chief Complaint:  Chief Complaint  Patient presents with   Follow-up   Anxiety   Depression   Medication Refill   Discussed the use of AI scribe software for clinical note transcription with the patient, who gave verbal consent to proceed.  History of Present Illness Christine Mcneil is a 58 year old Caucasian female, divorced, lives in [place], has a history of MDD, PTSD, GAD, insomnia, history of CVA, history of prothrombin gene mutation, chronic migraine headaches, B12 deficiency, iron  deficiency was evaluated by telemedicine today.  She reports that her depression remains under control on her current medications. Over the past 2 weeks, she denies significant sadness or hopelessness and describes only a slight decrease in interest or pleasure in activities. Spending time with her sister, she feels she is coping better with her grief.  She reports that management of her PTSD symptoms has improved, and she reports no nightmares, intrusive memories, or  flashbacks.  She continues to experience ongoing sleep difficulties, as she typically gets about 5 hours of sleep per night and does not feel rested upon waking. She continues to take Sonata  for sleep and uses hydroxyzine  25-50 mg at bedtime daily, although prescribed as needed. She confirms ongoing use of sertraline  200 mg daily and rexulti  0.75 mg daily, and denies any side effects from her medications.  She denies any suicidality, homicidality or perceptual disturbances.  She denies any other concerns today.    Visit Diagnosis:    ICD-10-CM   1. Recurrent major depressive disorder, in full remission  F33.42 brexpiprazole  (REXULTI ) 0.25 MG TABS tablet    2. PTSD (post-traumatic stress disorder)  F43.10 sertraline  (ZOLOFT ) 100 MG tablet    Brexpiprazole  (REXULTI ) 0.5 MG TABS    3. GAD (generalized anxiety disorder)  F41.1 sertraline  (ZOLOFT ) 100 MG tablet    hydrOXYzine  (VISTARIL ) 25 MG capsule    Brexpiprazole  (REXULTI ) 0.5 MG TABS    4. Insomnia due to medical condition  G47.01 zaleplon  (SONATA ) 5 MG capsule   Mood symptoms, uncorrected sleep apnea      Past Psychiatric History: I have reviewed past psychiatric history from progress note on 07/19/2017.  Outside medications like Effexor, Klonopin , mirtazapine , trazodone , Belsomra , lemborexant , unable to afford.  Past Medical History:  Past Medical History:  Diagnosis Date   Abdominal pain    Anxiety and depression    Arthritis of back    Atypical facial pain    Bilateral occipital neuralgia    CAD (coronary artery disease)    Cervical spondylosis without myelopathy    Chronic daily headache  Chronic left-sided low back pain without sciatica    Chronic pain    Chronic pain syndrome    Chronic pain syndrome under treatment with controlled substance agreement    Chronic pelvic pain in female    Chronic thoracic back pain    Complication of anesthesia    Depression    Diabetes mellitus without complication (HCC)     Diabetes mellitus, type II (HCC)    Dysphagia    Dyspnea    Dysrhythmia    Fatty liver    Fibromyalgia    Fibrosis of liver    Generalized anxiety disorder    GERD (gastroesophageal reflux disease)    History of kidney stones    has small one currently   Hyperlipidemia    Hypertension    Hypothyroidism    Insomnia    Iron  deficiency anemia    Left hip pain    Low back pain    Migraines    Neuropathy    Numbness    Optic neuropathy    OSA (obstructive sleep apnea)    not using cpap   Polyarthralgia    PONV (postoperative nausea and vomiting)    Primary osteoarthritis of both hips    Prothrombin gene mutation    Pseudotumor cerebri    PTSD (post-traumatic stress disorder)    Rectal bleeding    Right shoulder pain    Rotator cuff syndrome    Spinal cord stimulator status    Stroke (HCC)    10 years ago   Thyroid disease    TIA (transient ischemic attack) 05/27/2017    Past Surgical History:  Procedure Laterality Date   ABDOMINAL HYSTERECTOMY     total   CHOLECYSTECTOMY     COLONOSCOPY WITH PROPOFOL  N/A 03/21/2018   Procedure: COLONOSCOPY WITH PROPOFOL ;  Surgeon: Jinny Carmine, MD;  Location: [facility] ENDOSCOPY;  Service: Endoscopy;  Laterality: N/A;   ERCP N/A 08/19/2020   Procedure: ENDOSCOPIC RETROGRADE CHOLANGIOPANCREATOGRAPHY (ERCP);  Surgeon: Jinny Carmine, MD;  Location: [facility] ENDOSCOPY;  Service: Endoscopy;  Laterality: N/A;   ESOPHAGOGASTRODUODENOSCOPY N/A 03/13/2024   Procedure: EGD (ESOPHAGOGASTRODUODENOSCOPY) WITH BIOPSY;  Surgeon: Unk Corinn Skiff, MD;  Location: [facility];  Service: Endoscopy;  Laterality: N/A;   ESOPHAGOGASTRODUODENOSCOPY (EGD) WITH PROPOFOL  N/A 03/21/2018   Procedure: ESOPHAGOGASTRODUODENOSCOPY (EGD) WITH PROPOFOL ;  Surgeon: Jinny Carmine, MD;  Location: [facility] ENDOSCOPY;  Service: Endoscopy;  Laterality: N/A;   GIVENS CAPSULE STUDY N/A 09/24/2019   Procedure: GIVENS CAPSULE STUDY;  Surgeon: Janalyn Keene NOVAK, MD;  Location: [facility]  ENDOSCOPY;  Service: Endoscopy;  Laterality: N/A;   KNEE SURGERY Left    LEFT HEART CATH AND CORONARY ANGIOGRAPHY N/A 03/10/2022   Procedure: LEFT HEART CATH AND CORONARY ANGIOGRAPHY;  Surgeon: Florencio Cara BIRCH, MD;  Location: [facility] INVASIVE CV LAB;  Service: Cardiovascular;  Laterality: N/A;   ROTATOR CUFF REPAIR Right    spinal neurostimulator      Family Psychiatric History: I have reviewed family psychiatric history from progress note on 07/19/2017.  Family History:  Family History  Problem Relation Age of Onset   Diabetes Mother        Mother passed away on 2023/06/09.   Hyperlipidemia Mother    Hypertension Mother    COPD Mother    CVA Mother    Anemia Mother    Diabetes Father    Hyperlipidemia Father    Hypertension Father    Thyroid disease Father    Alcohol abuse Father    Peripheral vascular disease Sister  Hypertension Sister    Anxiety disorder Son    Depression Son    Bipolar disorder Son    Seizures Son     Social History: I have reviewed social history from progress note on 07/19/2017. Social History   Socioeconomic History   Marital status: Divorced    Spouse name: Not on file   Number of children: 1   Years of education: Not on file   Highest education level: High school graduate  Occupational History    Comment: disablitiy  Tobacco Use   Smoking status: Never    Passive exposure: Never   Smokeless tobacco: Never  Vaping Use   Vaping status: Never Used  Substance and Sexual Activity   Alcohol use: No   Drug use: Never   Sexual activity: Not Currently  Other Topics Concern   Not on file  Social History Narrative   Not on file   Social Drivers of Health   Financial Resource Strain: Low Risk  (10/11/2023)   [facility] from [facility]   Overall Financial Resource Strain (CARDIA)    Difficulty of Paying Living Expenses: Not hard at all  Food Insecurity: No Food Insecurity (10/11/2023)   [facility]   Hunger Vital Sign    Within the past 12 months, you worried that your food would run out before you got the money to buy more.: Never true    Within the past 12 months, the food you bought just didn't last and you didn't have money to get more.: Never true  Transportation Needs: No Transportation Needs (10/11/2023)   Received from [facility] - Transportation    In the past 12 months, has lack of transportation kept you from medical appointments or from getting medications?: No    Lack of Transportation (Non-Medical): No  Physical Activity: Inactive (07/19/2017)   Exercise Vital Sign    Days of Exercise per Week: 0 days    Minutes of Exercise per Session: 0 min  Stress: Stress Concern Present (07/19/2017)   [employer] of Occupational [employer] - Occupational Stress Questionnaire    Feeling of Stress : Very much  Social Connections: Moderately Isolated (07/19/2017)   Social Connection and Isolation Panel    Frequency of Communication with Friends and Family: Once a week    Frequency of Social Gatherings with Friends and Family: Never    Attends Religious Services: More than 4 times per year    Active Member of [employer] or Organizations: No    Attends Banker Meetings: Never    Marital Status: Divorced    Allergies:  Allergies  Allergen Reactions   Semaglutide Other (See Comments)    Patient became sick, dehydrated, had to go to hospital--couldn't tolerate  semaglutide   Amoxapine Itching   Amoxicillin Itching   Dapagliflozin Itching    Other reaction(s): Other (See Comments) Thrush & vaginal itching   Keflex [Cephalexin] Itching   Mirtazapine  Other (See Comments)    Pt states felling like my body is outside of me.    Metabolic Disorder Labs: Lab Results  Component Value Date   HGBA1C 9.9 (H) 06/21/2017   MPG 237.43 06/21/2017   Lab Results  Component Value Date   PROLACTIN 4.3 (L) 05/26/2021   PROLACTIN 5.5 09/06/2017    Lab Results  Component Value Date   CHOL 161 02/22/2023   TRIG 294 (H) 02/22/2023   HDL 32 (L) 05/26/2021   CHOLHDL  4.9 (H) 05/26/2021   VLDL 39 06/21/2017   LDLCALC 84 05/26/2021   LDLCALC 174 (H) 06/21/2017   Lab Results  Component Value Date   TSH 2.890 09/06/2017   TSH 3.176 01/04/2017    Therapeutic Level Labs: No results found for: LITHIUM No results found for: VALPROATE No results found for: CBMZ  Current Medications: Current Outpatient Medications  Medication Sig Dispense Refill   ACCU-CHEK AVIVA PLUS test strip      ACCU-CHEK SOFTCLIX LANCETS lancets      Acetaminophen  (TYLENOL  ARTHRITIS EXT RELIEF PO) Take 600 mg by mouth daily.     albuterol  (VENTOLIN  HFA) 108 (90 Base) MCG/ACT inhaler Inhale 2 puffs into the lungs every 6 (six) hours as needed for wheezing or shortness of breath. 1 g 0   atorvastatin  (LIPITOR) 80 MG tablet Take 80 mg by mouth daily at 6 PM.     Blood Glucose Monitoring Suppl (ACCU-CHEK AVIVA PLUS) w/Device KIT      brexpiprazole  (REXULTI ) 0.25 MG TABS tablet Take 1 tablet (0.25 mg total) by mouth daily. Take along with 0.5 mg daily , total of 0.75 mg daily. 90 tablet 1   Brexpiprazole  (REXULTI ) 0.5 MG TABS Take 1 tablet (0.5 mg total) by mouth daily. Take along with 0.25 mg , total of 0.75 mg daily 90 tablet 1   Carboxymethylcellulose Sod PF 0.5 % SOLN Apply 1 drop to eye daily as needed.     Cholecalciferol  (VITAMIN D3) 1000 units CAPS Take 1 capsule by mouth daily.     clobetasol ointment (TEMOVATE) 0.05 % Apply topically.     clotrimazole (LOTRIMIN) 1 % cream Apply 1 Application topically.     Continuous Blood Gluc Receiver (DEXCOM G6 RECEIVER) DEVI Use to monitor blood sugar. NDC [phone]     Continuous Blood Gluc Sensor (DEXCOM G6 SENSOR) MISC Use to monitor blood sugar.  Replace every 10 days. NDC 4701458528.     Continuous Blood Gluc Transmit (DEXCOM G6 TRANSMITTER) MISC Use to monitor blood sugar.  Replace every 3 months.  NDC [address]-01.     dabigatran  (PRADAXA ) 150 MG CAPS capsule Take 150 mg by mouth 2 (two) times daily.     dicyclomine  (BENTYL ) 20 MG tablet TAKE 1 TABLET (20 MG TOTAL) BY MOUTH 3 (THREE) TIMES DAILY BEFORE MEALS. 270 tablet 0   DILT-XR 240 MG 24 hr capsule Take 240 mg by mouth daily.     diltiazem  (CARDIZEM  CD) 240 MG 24 hr capsule Take 240 mg by mouth daily.     ezetimibe (ZETIA) 10 MG tablet Take 10 mg by mouth at bedtime.     famotidine  (PEPCID ) 40 MG tablet Take 40 mg by mouth at bedtime.     ferrous sulfate 325 (65 FE) MG tablet Take by mouth. Take 325 mg daily with breakfast     fexofenadine (ALLEGRA ALLERGY) 180 MG tablet Take 180 mg by mouth daily.     fluticasone  (FLONASE ) 50 MCG/ACT nasal spray Place 1 spray into both nostrils daily. 9.9 mL 0   furosemide (LASIX) 40 MG tablet TAKE (1) TABLET BY MOUTH EVERY DAY     gabapentin  (NEURONTIN ) 300 MG capsule TAKE 2 CAPSULE IN AM, 1 CAP MID DAY AND 3 CAP BY MOUTH AT BEDTIME     hydrOXYzine  (VISTARIL ) 25 MG capsule Take 1-2 capsules (25-50 mg total) by mouth 2 (two) times daily as needed for anxiety (and sleep). 360 capsule 1   Insulin  Disposable Pump (OMNIPOD 5 DEXG7G6 PODS GEN 5) MISC SMARTSIG:1  SUB-Q Every Other Day     insulin  lispro (HUMALOG KWIKPEN) 200 UNIT/ML KwikPen Inject 125 Units into the skin.     Insulin  Pen Needle (TRUEPLUS PEN NEEDLES) 29G X MISC      lamoTRIgine  (LAMICTAL ) 100 MG tablet Take 100 mg by mouth daily.     levothyroxine  (SYNTHROID ) 112 MCG tablet Take 112 mcg by mouth daily.     lidocaine  4 % 1 patch daily.     magnesium oxide (MAG-OX) 400 MG tablet Take 1 tablet by mouth daily.     Melatonin 10 MG TABS Take 2 tablets by mouth at bedtime.     meloxicam (MOBIC) 15 MG tablet Take 15 mg by mouth daily.     metFORMIN  (GLUCOPHAGE -XR) 500 MG 24 hr tablet Take 500 mg by mouth 2 (two) times daily.      methocarbamol (ROBAXIN) 500 MG tablet Take 500 mg by mouth 2 (two) times daily as needed.     metoCLOPramide   (REGLAN ) 10 MG tablet METOCLOPRAMIDE  HCL 10 MG TABS     metoprolol  succinate (TOPROL -XL) 25 MG 24 hr tablet Take 1 tablet by mouth daily.     mometasone (ELOCON) 0.1 % cream Apply topically.     naloxone (NARCAN) nasal spray 4 mg/0.1 mL Place into the nose.     nitroGLYCERIN  (NITROSTAT ) 0.4 MG SL tablet Place under the tongue.     nystatin (MYCOSTATIN) 100000 UNIT/ML suspension Take 5 mLs by mouth 4 (four) times daily.     omeprazole  (PRILOSEC) 20 MG capsule TAKE (1) CAPSULE BY MOUTH EVERY DAY 30 capsule 0   ondansetron  (ZOFRAN ) 8 MG tablet Take 8 mg by mouth every 8 (eight) hours as needed.     oxyCODONE  (OXY IR/ROXICODONE ) 5 MG immediate release tablet Take 5 mg by mouth 2 (two) times daily as needed.     promethazine  (PHENERGAN ) 12.5 MG tablet Take 12.5 mg by mouth.     QULIPTA 60 MG TABS Take 60 mg by mouth daily.     sertraline  (ZOLOFT ) 100 MG tablet Take 2 tablets (200 mg total) by mouth daily with breakfast. 180 tablet 1   TIADYLT  ER 240 MG 24 hr capsule Take 240 mg by mouth daily.     vitamin B-12 (CYANOCOBALAMIN ) 100 MCG tablet Take 1 tablet (100 mcg total) by mouth daily. 90 tablet 1   Vitamin D , Ergocalciferol , 50 MCG (2000 UT) CAPS TAKE 1 CAPSULE (50,000 UNITS TOTAL) BY MOUTH ONCE A WEEK FOR 56 DAYS     XTAMPZA  ER 9 MG C12A PLEASE SEE ATTACHED FOR DETAILED DIRECTIONS     zaleplon  (SONATA ) 5 MG capsule Take 1 capsule (5 mg total) by mouth at bedtime as needed for sleep. 30 capsule 3   No current facility-administered medications for this visit.   Facility-Administered Medications Ordered in Other Visits  Medication Dose Route Frequency Provider Last Rate Last Admin   iohexol  (OMNIPAQUE ) 300 MG/ML solution    PRN Callwood, Dwayne D, MD   60 mL at 03/10/22 1442     Musculoskeletal: Strength & Muscle Tone: UTA Gait & Station: Seated Patient leans: N/A  Psychiatric Specialty Exam: Review of Systems  Psychiatric/Behavioral:  Positive for sleep disturbance.     There were no  vitals taken for this visit.There is no height or weight on file to calculate BMI.  General Appearance: Fairly Groomed  Eye Contact:  Fair  Speech:  Clear and Coherent  Volume:  Normal  Mood:  Euthymic  Affect:  Congruent  Thought  Process:  Goal Directed and Descriptions of Associations: Intact  Orientation:  Full (Time, Place, and Person)  Thought Content: Logical   Suicidal Thoughts:  No  Homicidal Thoughts:  No  Memory:  Immediate;   Fair Recent;   Fair Remote;   Fair  Judgement:  Fair  Insight:  Fair  Psychomotor Activity:  Normal  Concentration:  Concentration: Fair and Attention Span: Fair  Recall:  [employer] of Knowledge: Fair  Language: Fair  Akathisia:  No  Handed:  Right  AIMS (if indicated): not done  Assets:  Communication Skills Desire for Improvement Housing Resilience Social Support Talents/Skills  ADL's:  Intact  Cognition: WNL  Sleep:  varies   Screenings: Geneticist, Molecular Office Visit from 10/10/2023 in [facility] Office Visit from 05/09/2023 in [facility] Office Visit from 03/23/2023 in [facility] Office Visit from 02/21/2023 in [facility] Office Visit from 01/26/2023 in [facility]  AIMS Total Score 0 0 0 0 0   GAD-7    Flowsheet Row Counselor from 01/17/2024 in [facility] Counselor from 08/01/2023 in [facility] Office Visit from 05/09/2023 in [facility] Office Visit from 03/23/2023 in [facility] Office Visit from 02/21/2023 in [facility]  Total GAD-7 Score 16 10 11 11 19    PHQ2-9    Flowsheet Row Video Visit from 04/04/2024 in [facility] Counselor from 01/17/2024 in [facility] Office Visit from 10/10/2023 in [facility] Counselor from 08/01/2023 in [facility] Office Visit from 05/09/2023 in [facility]  PHQ-2 Total Score 1 4 2 4 3   PHQ-9 Total Score -- 12 9 10 11    Flowsheet Row Video Visit from 04/04/2024 in [facility] Admission (Discharged) from 03/13/2024 in [facility] Counselor from 01/17/2024 in [facility]  C-SSRS RISK CATEGORY Moderate Risk No Risk Error: Q7 should not be populated when Q6 is No     Assessment and Plan: VALYNCIA WIENS is a 58 year old Caucasian female who presented for a follow-up appointment, discussed assessment and plan as noted below.  1. Recurrent major depressive disorder, in full remission Currently denies any significant depression symptoms.  Does have sleep problems likely due to uncorrected sleep apnea. Continue Sertraline  200 mg daily Continue Rexulti  0.75 mg daily Patient aware of drug to drug interaction between Rexulti  and Metoclopramide .   2. PTSD (post-traumatic stress disorder)-stable Currently denies any trauma related symptoms. Continue Sertraline  as prescribed Continue psychotherapy sessions as needed  3. GAD (generalized anxiety disorder)-stable Denies any significant anxiety symptoms. Continue Hydroxyzine  25-50 mg twice a day as needed Continue Sertraline  as prescribed  4. Insomnia due to medical condition-unstable Ongoing sleep problems, missed her most recent sleep study.  Agrees to get it completed.  Uncorrected sleep apnea likely causing sleep problems. Encouraged to complete sleep study. Provided education.  Continue Sonata  5 mg at bedtime. Encouraged to work  on sleep hygiene Reviewed [place] PMP AWARxE   Follow-up Follow-up in clinic in 3 months or sooner in person.  Collaboration of Care: Collaboration of Care: Referral or follow-up with counselor/therapist AEB encouraged to  continue psychotherapy sessions as needed, encouraged to complete sleep study.  Patient/Guardian was advised Release of Information must be obtained prior to any record release in order to collaborate their care with an outside provider. Patient/Guardian was advised if they have not already done so to contact the registration department to sign all necessary forms in order for us  to release information regarding their care.   Consent: Patient/Guardian gives verbal consent for treatment and assignment of benefits for services provided during this visit. Patient/Guardian expressed understanding and agreed to proceed.  This note was generated in part or whole with voice recognition software. Voice recognition is usually quite accurate but there are transcription errors that can and very often do occur. I apologize for any typographical errors that were not detected and corrected.     Kaycie Pegues, MD 04/04/2024, 9:56 AM

## 2024-04-13 ENCOUNTER — Other Ambulatory Visit

## 2024-04-17 ENCOUNTER — Telehealth: Payer: Self-pay | Admitting: Licensed Clinical Social Worker

## 2024-04-17 ENCOUNTER — Encounter: Payer: Self-pay | Admitting: Licensed Clinical Social Worker

## 2024-04-17 NOTE — Telephone Encounter (Signed)
 Patient with multiple no show for therapy. She has been dismissed from therapy

## 2024-04-20 ENCOUNTER — Ambulatory Visit
Admission: RE | Admit: 2024-04-20 | Discharge: 2024-04-20 | Disposition: A | Discharge status: Home / Self Care | Source: Ambulatory Visit | Attending: Gastroenterology | Admitting: Gastroenterology

## 2024-04-20 DIAGNOSIS — R1110 Vomiting, unspecified: Secondary | ICD-10-CM | POA: Diagnosis present

## 2024-04-20 DIAGNOSIS — R110 Nausea: Secondary | ICD-10-CM | POA: Insufficient documentation

## 2024-04-20 DIAGNOSIS — R1013 Epigastric pain: Secondary | ICD-10-CM | POA: Diagnosis present

## 2024-04-20 MED ORDER — TECHNETIUM TC 99M SULFUR COLLOID
2.1900 | Freq: Once | INTRAVENOUS | Status: AC | PRN
  Administered 2024-04-20: 2.19 via ORAL

## 2024-04-23 ENCOUNTER — Ambulatory Visit: Payer: Self-pay | Admitting: Gastroenterology

## 2024-05-09 ENCOUNTER — Ambulatory Visit (INDEPENDENT_AMBULATORY_CARE_PROVIDER_SITE_OTHER): Admitting: Nurse Practitioner

## 2024-05-09 ENCOUNTER — Encounter (INDEPENDENT_AMBULATORY_CARE_PROVIDER_SITE_OTHER): Payer: Self-pay

## 2024-05-29 ENCOUNTER — Other Ambulatory Visit: Payer: Self-pay

## 2024-05-29 ENCOUNTER — Encounter: Payer: Self-pay | Admitting: Emergency Medicine

## 2024-05-29 ENCOUNTER — Emergency Department
Admission: EM | Admit: 2024-05-29 | Discharge: 2024-05-29 | Disposition: A | Discharge status: Home / Self Care | Attending: Emergency Medicine | Admitting: Emergency Medicine

## 2024-05-29 DIAGNOSIS — E119 Type 2 diabetes mellitus without complications: Secondary | ICD-10-CM | POA: Diagnosis not present

## 2024-05-29 DIAGNOSIS — L02414 Cutaneous abscess of left upper limb: Secondary | ICD-10-CM | POA: Diagnosis not present

## 2024-05-29 DIAGNOSIS — L0291 Cutaneous abscess, unspecified: Secondary | ICD-10-CM

## 2024-05-29 DIAGNOSIS — M7989 Other specified soft tissue disorders: Secondary | ICD-10-CM | POA: Diagnosis present

## 2024-05-29 MED ORDER — CLINDAMYCIN HCL 150 MG PO CAPS: 450.0000 mg | ORAL_CAPSULE | Freq: Three times a day (TID) | ORAL | 0 refills | Status: AC

## 2024-05-29 NOTE — Discharge Instructions (Addendum)
 Take the antibiotics for concern for an infection.  If this is getting worse or getting bigger in size you may have to undergo incision and drainage of it as it needle aspiration was not able to get any pus out.  Follow-up with your primary care doctor for recheck in 2 days or return to the ER for worsening symptoms or any other concern

## 2024-05-29 NOTE — ED Triage Notes (Signed)
 Patient ambulatory to triage with steady gait, without difficulty or distress noted; pt reports since yesterday has noticed painful abscess to left axillae unrelieved by warm compresses

## 2024-05-29 NOTE — ED Provider Notes (Addendum)
 "  [facility] Provider Note    Event Date/Time   First MD Initiated Contact with Patient 05/29/24 [phone]     (approximate)   History   No chief complaint on file.   HPI  Christine Mcneil is a 59 y.o. female with history of chronic pain, spinal cord stimulator, diabetes who comes in with left armpit swelling.  Patient reports feeling a knot under her left arm yesterday with associated pain.  Does report history of abscesses.  She has never had 1 in this area but does report 1 that was in her rectum that need to be I&D.  Patient does report being on blood thinner.  She is on Pradaxa .  She denies any symptoms of DKA and does monitor her sugars at home and denies any concerns for these.   Physical Exam   Triage Vital Signs: ED Triage Vitals  Encounter Vitals Group     BP 05/29/24 0437 (!) 148/87     Girls Systolic BP Percentile --      Girls Diastolic BP Percentile --      Boys Systolic BP Percentile --      Boys Diastolic BP Percentile --      Pulse Rate 05/29/24 0437 77     Resp 05/29/24 0437 20     Temp 05/29/24 0437 97.6 F (36.4 C)     Temp Source 05/29/24 0437 Oral     SpO2 05/29/24 0437 98 %     Weight 05/29/24 0435 250 lb (113.4 kg)     Height 05/29/24 0435 5' 6 (1.676 m)     Head Circumference --      Peak Flow --      Pain Score 05/29/24 0435 6     Pain Loc --      Pain Education --      Exclude from Growth Chart --     Most recent vital signs: Vitals:   05/29/24 0437  BP: (!) 148/87  Pulse: 77  Resp: 20  Temp: 97.6 F (36.4 C)  SpO2: 98%     General: Awake, no distress.  CV:  Good peripheral perfusion.  Resp:  Normal effort.  Abd:  No distention.  Other:  Patient has dime size nodule noted underneath the left armpit with some surrounding erythema about quarter size.  It is not fluctuant in nature Good distal pulse of the arm.  No swelling in the arm.  ED Results / Procedures / Treatments   Labs (all labs ordered are listed,  but only abnormal results are displayed) Labs Reviewed - No data to display   EKG  My interpretation of EKG:    RADIOLOGY I have reviewed the xray personally and interpreted    PROCEDURES:  Critical Care performed: No  .Incision and Drainage  Date/Time: 05/29/2024 5:39 AM  Performed by: Ernest Ronal BRAVO, MD Authorized by: Ernest Ronal BRAVO, MD   Consent:    Consent obtained:  Verbal   Consent given by:  Patient   Risks discussed:  Bleeding, incomplete drainage, pain, infection and damage to other organs   Alternatives discussed:  Alternative treatment Universal protocol:    Patient identity confirmed:  Verbally with patient Location:    Type:  Abscess   Size:  1x1   Location:  Upper extremity   Upper extremity location: armpit. Pre-procedure details:    Skin preparation:  Antiseptic wash Sedation:    Sedation type:  None Procedure type:    Complexity:  Simple Procedure details:    Incision type: needle aspiration. Comments:     Needle aspiration attempted x 2 without any return of fluid    MEDICATIONS ORDERED IN ED: Medications - No data to display   IMPRESSION / MDM / ASSESSMENT AND PLAN / ED COURSE  I reviewed the triage vital signs and the nursing notes.   Patient's presentation is most consistent with acute, uncomplicated illness.   Patient comes in well-appearing nonseptic with small nodule underneath the left arm pit.  Bedside ultrasound does show some cobblestoning and may be a very tiny fluid collection.  Discussed with patient I&D versus needle aspiration.  The patient is on blood thinner and given I do not release sure if I will able to even get a lot of this out given how small it is I did attempt bedside aspiration with ultrasound guidance.  After 2 attempts I did not have any pus that was relieved.  Discussed with patient doing I&D but will trial some antibiotics and she will return if symptoms are changing or worsening.  We discussed that it may need  I&D if antibiotics alone are not helping but do not feel like I&D would be specifically beneficial at this time and she also want to try to hold off on I&D and try antibiotics first.  She has no symptoms suggest DKA and is otherwise very well-appearing she has got no swelling in the arm to suggest DVT and this is superficial.  Given the sudden onset of appearance, redness associated with it seems less likely consistent with lymph node given my bedside ultrasound but she may need further workup if it is not improving with antibiotics.  Given patient's allergies and wound cover both staph and strep we will do clinda patient reports tolerating this well in the past  Reviewed blood work from 05/25/2024 and patient's creatinine is normal at 0.9.     FINAL CLINICAL IMPRESSION(S) / ED DIAGNOSES   Final diagnoses:  Abscess     Rx / DC Orders   ED Discharge Orders     None        Note:  This document was prepared using Dragon voice recognition software and may include unintentional dictation errors.   Ernest Ronal BRAVO, MD 05/29/24 [id number]    Ernest Ronal BRAVO, MD 05/29/24 [phone]  "

## 2024-06-08 ENCOUNTER — Encounter: Payer: Self-pay | Admitting: Oncology

## 2024-06-08 ENCOUNTER — Inpatient Hospital Stay: Payer: 59 | Attending: Student

## 2024-06-08 ENCOUNTER — Inpatient Hospital Stay: Payer: 59 | Admitting: Nurse Practitioner

## 2024-06-26 ENCOUNTER — Encounter: Payer: Self-pay | Admitting: Psychiatry

## 2024-06-26 ENCOUNTER — Telehealth: Admitting: Psychiatry

## 2024-06-26 DIAGNOSIS — G4739 Other sleep apnea: Secondary | ICD-10-CM | POA: Diagnosis not present

## 2024-06-26 DIAGNOSIS — F3342 Major depressive disorder, recurrent, in full remission: Secondary | ICD-10-CM | POA: Diagnosis not present

## 2024-06-26 DIAGNOSIS — G4701 Insomnia due to medical condition: Secondary | ICD-10-CM

## 2024-06-26 DIAGNOSIS — F4310 Post-traumatic stress disorder, unspecified: Secondary | ICD-10-CM | POA: Diagnosis not present

## 2024-06-26 DIAGNOSIS — F411 Generalized anxiety disorder: Secondary | ICD-10-CM | POA: Diagnosis not present

## 2024-06-26 NOTE — Patient Instructions (Signed)
 Anxiety in Adults: How to Manage Anxiety is when you feel worried or scared. Everyone has anxiety at one time or another. Learning ways to manage anxiety and its causes, like stress, can help make your daily life better. How does anxiety affect me? Anxiety can cause health problems. These may include: Headaches. Body pain. High blood pressure. Sleep problems. Mental health symptoms. How to recognize anxiety and stress Anxiety and stress are connected to each other in many ways. For instance, having stress for a long time can lead to anxiety. But there are some differences. Stress: Is caused by something hard happening around you. Goes away after the hard thing is over. Anxiety: Can happen for no clear reason. Can stay after a stressful event is over. How to manage lifestyle changes To help manage your anxiety and stress, you could: Do things that help you feel calm, such as: Meditating. Relaxing your muscles. Deep breathing. Coloring or drawing. Move your body. You could: Take a short walk each day. Do yoga. Do things you like or that are fun for you, such as: Reading. Listening to music. Spend time in nature or watch nature videos. Try to change negative thoughts to positive ones. Spend time with friends and family. Follow these instructions at home: Lifestyle  Get enough good sleep each night. Eat healthy. Drink enough water . General instructions Take your medicines only as told. Ask your health care provider about treatments that may help. You may need: Medicine. Talk therapy. Both medicine and talk therapy. Where to find support For support, you can reach out to: Your provider. A mental health specialist, such as a therapist or counselor. Where to find more information To learn more, go to: The [employer] on Mental Illness (NAMI) at ringtonefundraiser.se. The [employer] of Mental Health [facility]) at bloggercourse.com. Click on the magnifying glass and type help  for mental illness. Find the link you need. Contact a health care provider if: You have stress you can't handle. Your anxiety stops you from doing daily tasks. You avoid things out of fear, worry, or stress. Your symptoms get worse. You have new symptoms. Your symptoms don't get better with treatment. Get help right away if: You feel like you may hurt yourself or others. You have thoughts about taking your own life. You have other thoughts or feelings that worry you. These symptoms may be an emergency. Take one of these steps right away: Go to your nearest emergency room. Call 911. Contact the Suicide & Crisis Lifeline (24/7, free and confidential): Call or text 988. Chat online at chat.newsactor.se. For Veterans and their loved ones: Call 988 and press 1. Text the [employer] at (503)141-9549. Chat online at reservationslist.si. This information is not intended to replace advice given to you by your health care provider. Make sure you discuss any questions you have with your health care provider. Document Revised: 03/05/2024 Document Reviewed: 03/05/2024 Elsevier Patient Education  2025 [employer]Insomnia Insomnia is a sleep disorder that makes it difficult to fall asleep or stay asleep. Insomnia can cause fatigue, low energy, difficulty concentrating, mood swings, and poor performance at work or school. There are three different ways to classify insomnia: Difficulty falling asleep. Difficulty staying asleep. Waking up too early in the morning. Any type of insomnia can be long-term (chronic) or short-term (acute). Both are common. Short-term insomnia usually lasts for 3 months or less. Chronic insomnia occurs at least three times a week for longer than 3 months. What are the causes? Insomnia  may be caused by another condition, situation, or substance, such as: Having certain mental health conditions, such as anxiety and depression. Using caffeine , alcohol, tobacco, or  drugs. Having gastrointestinal conditions, such as gastroesophageal reflux disease (GERD). Having certain medical conditions. These include: Asthma. Alzheimer's disease. Stroke. Chronic pain. An overactive thyroid gland (hyperthyroidism). Other sleep disorders, such as restless legs syndrome and sleep apnea. Menopause. Sometimes, the cause of insomnia may not be known. What increases the risk? Risk factors for insomnia include: Gender. Females are affected more often than males. Age. Insomnia is more common as people get older. Stress and certain medical and mental health conditions. Lack of exercise. Having an irregular work schedule. This may include working night shifts and traveling between different time zones. What are the signs or symptoms? If you have insomnia, the main symptom is having trouble falling asleep or having trouble staying asleep. This may lead to other symptoms, such as: Feeling tired or having low energy. Feeling nervous about going to sleep. Not feeling rested in the morning. Having trouble concentrating. Feeling irritable, anxious, or depressed. How is this diagnosed? This condition may be diagnosed based on: Your symptoms and medical history. Your health care provider may ask about: Your sleep habits. Any medical conditions you have. Your mental health. A physical exam. How is this treated? Treatment for insomnia depends on the cause. Treatment may focus on treating an underlying condition that is causing the insomnia. Treatment may also include: Medicines to help you sleep. Counseling or therapy. Lifestyle adjustments to help you sleep better. Follow these instructions at home: Eating and drinking  Limit or avoid alcohol, caffeinated beverages, and products that contain nicotine and tobacco, especially close to bedtime. These can disrupt your sleep. Do not eat a large meal or eat spicy foods right before bedtime. This can lead to digestive  discomfort that can make it hard for you to sleep. Sleep habits  Keep a sleep diary to help you and your health care provider figure out what could be causing your insomnia. Write down: When you sleep. When you wake up during the night. How well you sleep and how rested you feel the next day. Any side effects of medicines you are taking. What you eat and drink. Make your bedroom a dark, comfortable place where it is easy to fall asleep. Put up shades or blackout curtains to block light from outside. Use a white noise machine to block noise. Keep the temperature cool. Limit screen use before bedtime. This includes: Not watching TV. Not using your smartphone, tablet, or computer. Stick to a routine that includes going to bed and waking up at the same times every day and night. This can help you fall asleep faster. Consider making a quiet activity, such as reading, part of your nighttime routine. Try to avoid taking naps during the day so that you sleep better at night. Get out of bed if you are still awake after 15 minutes of trying to sleep. Keep the lights down, but try reading or doing a quiet activity. When you feel sleepy, go back to bed. General instructions Take over-the-counter and prescription medicines only as told by your health care provider. Exercise regularly as told by your health care provider. However, avoid exercising in the hours right before bedtime. Use relaxation techniques to manage stress. Ask your health care provider to suggest some techniques that may work well for you. These may include: Breathing exercises. Routines to release muscle tension. Visualizing peaceful scenes. Make sure  that you drive carefully. Do not drive if you feel very sleepy. Keep all follow-up visits. This is important. Contact a health care provider if: You are tired throughout the day. You have trouble in your daily routine due to sleepiness. You continue to have sleep problems, or your  sleep problems get worse. Get help right away if: You have thoughts about hurting yourself or someone else. Get help right away if you feel like you may hurt yourself or others, or have thoughts about taking your own life. Go to your nearest emergency room or: Call 911. Call the National Suicide Prevention Lifeline at (517)493-8545 or 988. This is open 24 hours a day. Text the Crisis Text Line at (424)461-4691. Summary Insomnia is a sleep disorder that makes it difficult to fall asleep or stay asleep. Insomnia can be long-term (chronic) or short-term (acute). Treatment for insomnia depends on the cause. Treatment may focus on treating an underlying condition that is causing the insomnia. Keep a sleep diary to help you and your health care provider figure out what could be causing your insomnia. This information is not intended to replace advice given to you by your health care provider. Make sure you discuss any questions you have with your health care provider. Document Revised: 04/20/2021 Document Reviewed: 04/20/2021 Elsevier Patient Education  2024 [employer].

## 2024-09-24 ENCOUNTER — Ambulatory Visit: Admitting: Psychiatry
